# Patient Record
Sex: Female | Born: 1950 | Race: White | Hispanic: No | Marital: Married | Smoking: Never smoker
Health system: Southern US, Community
[De-identification: ages and names within clinical notes are randomized; demographics above are authoritative.]

## PROBLEM LIST (undated history)

## (undated) DIAGNOSIS — M1990 Unspecified osteoarthritis, unspecified site: Secondary | ICD-10-CM

## (undated) DIAGNOSIS — E119 Type 2 diabetes mellitus without complications: Secondary | ICD-10-CM

## (undated) DIAGNOSIS — M5481 Occipital neuralgia: Secondary | ICD-10-CM

## (undated) DIAGNOSIS — R748 Abnormal levels of other serum enzymes: Secondary | ICD-10-CM

## (undated) DIAGNOSIS — G43019 Migraine without aura, intractable, without status migrainosus: Secondary | ICD-10-CM

## (undated) DIAGNOSIS — R51 Headache: Secondary | ICD-10-CM

## (undated) DIAGNOSIS — M436 Torticollis: Secondary | ICD-10-CM

## (undated) DIAGNOSIS — M47812 Spondylosis without myelopathy or radiculopathy, cervical region: Secondary | ICD-10-CM

## (undated) DIAGNOSIS — T8859XA Other complications of anesthesia, initial encounter: Secondary | ICD-10-CM

## (undated) DIAGNOSIS — N200 Calculus of kidney: Secondary | ICD-10-CM

## (undated) DIAGNOSIS — G8929 Other chronic pain: Secondary | ICD-10-CM

## (undated) DIAGNOSIS — F4310 Post-traumatic stress disorder, unspecified: Secondary | ICD-10-CM

## (undated) DIAGNOSIS — R110 Nausea: Secondary | ICD-10-CM

## (undated) DIAGNOSIS — T4145XA Adverse effect of unspecified anesthetic, initial encounter: Secondary | ICD-10-CM

## (undated) DIAGNOSIS — R569 Unspecified convulsions: Secondary | ICD-10-CM

## (undated) DIAGNOSIS — H409 Unspecified glaucoma: Secondary | ICD-10-CM

## (undated) DIAGNOSIS — M542 Cervicalgia: Secondary | ICD-10-CM

## (undated) DIAGNOSIS — M5382 Other specified dorsopathies, cervical region: Secondary | ICD-10-CM

## (undated) HISTORY — DX: Post-traumatic stress disorder, unspecified: F43.10

## (undated) HISTORY — PX: FINGER SURGERY: SHX640

## (undated) HISTORY — DX: Headache: R51

## (undated) HISTORY — DX: Nausea: R11.0

## (undated) HISTORY — PX: CRANIOTOMY: SHX93

## (undated) HISTORY — DX: Other specified dorsopathies, cervical region: M53.82

## (undated) HISTORY — DX: Torticollis: M43.6

## (undated) HISTORY — DX: Type 2 diabetes mellitus without complications: E11.9

## (undated) HISTORY — DX: Occipital neuralgia: M54.81

## (undated) HISTORY — DX: Spondylosis without myelopathy or radiculopathy, cervical region: M47.812

## (undated) HISTORY — PX: CHOLECYSTECTOMY: SHX55

## (undated) HISTORY — DX: Other chronic pain: G89.29

## (undated) HISTORY — PX: BREAST LUMPECTOMY: SHX2

## (undated) HISTORY — DX: Calculus of kidney: N20.0

## (undated) HISTORY — PX: HAND SURGERY: SHX662

## (undated) HISTORY — DX: Unspecified convulsions: R56.9

## (undated) HISTORY — DX: Migraine without aura, intractable, without status migrainosus: G43.019

## (undated) HISTORY — PX: CYST EXCISION: SHX5701

## (undated) HISTORY — DX: Abnormal levels of other serum enzymes: R74.8

## (undated) HISTORY — DX: Cervicalgia: M54.2

---

## 1996-08-12 HISTORY — PX: NECK SURGERY: SHX720

## 2001-12-24 ENCOUNTER — Encounter
Admission: RE | Admit: 2001-12-24 | Discharge: 2002-03-24 | Payer: Self-pay | Admitting: Physical Medicine & Rehabilitation

## 2002-02-08 ENCOUNTER — Ambulatory Visit (HOSPITAL_COMMUNITY)
Admission: RE | Admit: 2002-02-08 | Discharge: 2002-02-08 | Payer: Self-pay | Admitting: Physical Medicine & Rehabilitation

## 2002-02-11 ENCOUNTER — Ambulatory Visit (HOSPITAL_COMMUNITY)
Admission: RE | Admit: 2002-02-11 | Discharge: 2002-02-11 | Payer: Self-pay | Admitting: Physical Medicine & Rehabilitation

## 2002-02-11 ENCOUNTER — Encounter: Payer: Self-pay | Admitting: Physical Medicine & Rehabilitation

## 2002-03-25 ENCOUNTER — Encounter
Admission: RE | Admit: 2002-03-25 | Discharge: 2002-06-23 | Payer: Self-pay | Admitting: Physical Medicine & Rehabilitation

## 2002-06-24 ENCOUNTER — Encounter
Admission: RE | Admit: 2002-06-24 | Discharge: 2002-06-30 | Payer: Self-pay | Admitting: Physical Medicine & Rehabilitation

## 2002-07-01 ENCOUNTER — Encounter
Admission: RE | Admit: 2002-07-01 | Discharge: 2002-09-29 | Payer: Self-pay | Admitting: Physical Medicine & Rehabilitation

## 2002-09-30 ENCOUNTER — Encounter
Admission: RE | Admit: 2002-09-30 | Discharge: 2002-12-29 | Payer: Self-pay | Admitting: Physical Medicine & Rehabilitation

## 2002-12-30 ENCOUNTER — Encounter
Admission: RE | Admit: 2002-12-30 | Discharge: 2003-03-30 | Payer: Self-pay | Admitting: Physical Medicine & Rehabilitation

## 2003-02-03 ENCOUNTER — Encounter
Admission: RE | Admit: 2003-02-03 | Discharge: 2003-05-04 | Payer: Self-pay | Admitting: Physical Medicine & Rehabilitation

## 2003-03-31 ENCOUNTER — Encounter
Admission: RE | Admit: 2003-03-31 | Discharge: 2003-06-29 | Payer: Self-pay | Admitting: Physical Medicine & Rehabilitation

## 2003-06-13 ENCOUNTER — Encounter
Admission: RE | Admit: 2003-06-13 | Discharge: 2003-09-11 | Payer: Self-pay | Admitting: Physical Medicine & Rehabilitation

## 2003-06-30 ENCOUNTER — Encounter
Admission: RE | Admit: 2003-06-30 | Discharge: 2003-09-28 | Payer: Self-pay | Admitting: Physical Medicine & Rehabilitation

## 2003-08-26 ENCOUNTER — Inpatient Hospital Stay (HOSPITAL_COMMUNITY): Admission: EM | Admit: 2003-08-26 | Discharge: 2003-08-29 | Payer: Self-pay | Admitting: Psychiatry

## 2003-09-23 ENCOUNTER — Encounter
Admission: RE | Admit: 2003-09-23 | Discharge: 2003-12-22 | Payer: Self-pay | Admitting: Physical Medicine & Rehabilitation

## 2003-09-29 ENCOUNTER — Encounter
Admission: RE | Admit: 2003-09-29 | Discharge: 2003-12-28 | Payer: Self-pay | Admitting: Physical Medicine & Rehabilitation

## 2004-01-02 ENCOUNTER — Encounter
Admission: RE | Admit: 2004-01-02 | Discharge: 2004-04-01 | Payer: Self-pay | Admitting: Physical Medicine & Rehabilitation

## 2004-01-17 ENCOUNTER — Encounter
Admission: RE | Admit: 2004-01-17 | Discharge: 2004-04-16 | Payer: Self-pay | Admitting: Physical Medicine & Rehabilitation

## 2004-04-02 ENCOUNTER — Encounter
Admission: RE | Admit: 2004-04-02 | Discharge: 2004-07-01 | Payer: Self-pay | Admitting: Physical Medicine & Rehabilitation

## 2004-04-30 ENCOUNTER — Encounter
Admission: RE | Admit: 2004-04-30 | Discharge: 2004-07-29 | Payer: Self-pay | Admitting: Physical Medicine & Rehabilitation

## 2004-05-01 ENCOUNTER — Ambulatory Visit: Payer: Self-pay | Admitting: Anesthesiology

## 2004-05-09 ENCOUNTER — Ambulatory Visit: Payer: Self-pay | Admitting: Physical Medicine & Rehabilitation

## 2004-07-02 ENCOUNTER — Encounter
Admission: RE | Admit: 2004-07-02 | Discharge: 2004-09-30 | Payer: Self-pay | Admitting: Physical Medicine & Rehabilitation

## 2004-08-08 ENCOUNTER — Encounter
Admission: RE | Admit: 2004-08-08 | Discharge: 2004-11-06 | Payer: Self-pay | Admitting: Physical Medicine & Rehabilitation

## 2004-10-10 ENCOUNTER — Ambulatory Visit: Payer: Self-pay | Admitting: Physical Medicine & Rehabilitation

## 2004-11-01 ENCOUNTER — Encounter: Admission: RE | Admit: 2004-11-01 | Discharge: 2005-01-30 | Payer: Self-pay | Admitting: Psychology

## 2004-11-08 ENCOUNTER — Encounter
Admission: RE | Admit: 2004-11-08 | Discharge: 2005-02-06 | Payer: Self-pay | Admitting: Physical Medicine & Rehabilitation

## 2004-12-12 ENCOUNTER — Ambulatory Visit: Payer: Self-pay | Admitting: Physical Medicine & Rehabilitation

## 2005-01-31 ENCOUNTER — Encounter: Admission: RE | Admit: 2005-01-31 | Discharge: 2005-05-01 | Payer: Self-pay | Admitting: Psychology

## 2005-02-08 ENCOUNTER — Encounter
Admission: RE | Admit: 2005-02-08 | Discharge: 2005-05-09 | Payer: Self-pay | Admitting: Physical Medicine & Rehabilitation

## 2005-02-15 ENCOUNTER — Ambulatory Visit: Payer: Self-pay | Admitting: Physical Medicine & Rehabilitation

## 2005-03-22 ENCOUNTER — Ambulatory Visit: Payer: Self-pay | Admitting: Physical Medicine & Rehabilitation

## 2005-04-29 ENCOUNTER — Ambulatory Visit: Payer: Self-pay | Admitting: Physical Medicine & Rehabilitation

## 2005-05-03 ENCOUNTER — Encounter: Admission: RE | Admit: 2005-05-03 | Discharge: 2005-08-01 | Payer: Self-pay | Admitting: Psychology

## 2005-06-04 ENCOUNTER — Encounter
Admission: RE | Admit: 2005-06-04 | Discharge: 2005-09-02 | Payer: Self-pay | Admitting: Physical Medicine & Rehabilitation

## 2005-06-04 ENCOUNTER — Ambulatory Visit: Payer: Self-pay | Admitting: Physical Medicine & Rehabilitation

## 2005-07-12 ENCOUNTER — Ambulatory Visit: Payer: Self-pay | Admitting: Physical Medicine & Rehabilitation

## 2005-08-02 ENCOUNTER — Encounter: Admission: RE | Admit: 2005-08-02 | Discharge: 2005-10-31 | Payer: Self-pay | Admitting: Psychology

## 2005-08-16 ENCOUNTER — Ambulatory Visit: Payer: Self-pay | Admitting: Physical Medicine & Rehabilitation

## 2005-10-03 ENCOUNTER — Ambulatory Visit: Payer: Self-pay | Admitting: Physical Medicine & Rehabilitation

## 2005-10-03 ENCOUNTER — Encounter
Admission: RE | Admit: 2005-10-03 | Discharge: 2006-01-01 | Payer: Self-pay | Admitting: Physical Medicine & Rehabilitation

## 2005-11-01 ENCOUNTER — Encounter: Admission: RE | Admit: 2005-11-01 | Discharge: 2006-01-30 | Payer: Self-pay | Admitting: Psychology

## 2005-11-11 ENCOUNTER — Ambulatory Visit: Payer: Self-pay | Admitting: Physical Medicine & Rehabilitation

## 2005-12-02 ENCOUNTER — Encounter: Admission: RE | Admit: 2005-12-02 | Discharge: 2006-03-02 | Payer: Self-pay | Admitting: Anesthesiology

## 2006-01-31 ENCOUNTER — Encounter: Admission: RE | Admit: 2006-01-31 | Discharge: 2006-05-01 | Payer: Self-pay | Admitting: Psychology

## 2006-03-06 ENCOUNTER — Encounter
Admission: RE | Admit: 2006-03-06 | Discharge: 2006-06-04 | Payer: Self-pay | Admitting: Physical Medicine & Rehabilitation

## 2006-03-06 ENCOUNTER — Ambulatory Visit: Payer: Self-pay | Admitting: Physical Medicine & Rehabilitation

## 2006-04-11 ENCOUNTER — Encounter: Admission: RE | Admit: 2006-04-11 | Discharge: 2006-07-10 | Payer: Self-pay | Admitting: Anesthesiology

## 2006-04-15 ENCOUNTER — Ambulatory Visit: Payer: Self-pay | Admitting: Anesthesiology

## 2006-05-14 ENCOUNTER — Encounter: Admission: RE | Admit: 2006-05-14 | Discharge: 2006-08-12 | Payer: Self-pay | Admitting: Psychology

## 2006-05-27 ENCOUNTER — Ambulatory Visit: Payer: Self-pay | Admitting: Anesthesiology

## 2006-06-05 ENCOUNTER — Encounter
Admission: RE | Admit: 2006-06-05 | Discharge: 2006-09-03 | Payer: Self-pay | Admitting: Physical Medicine & Rehabilitation

## 2006-06-05 ENCOUNTER — Ambulatory Visit: Payer: Self-pay | Admitting: Physical Medicine & Rehabilitation

## 2006-07-16 ENCOUNTER — Ambulatory Visit: Payer: Self-pay | Admitting: Physical Medicine & Rehabilitation

## 2006-08-29 ENCOUNTER — Ambulatory Visit: Payer: Self-pay | Admitting: Physical Medicine & Rehabilitation

## 2006-09-02 ENCOUNTER — Encounter: Admission: RE | Admit: 2006-09-02 | Discharge: 2006-12-01 | Payer: Self-pay | Admitting: Psychology

## 2006-09-25 ENCOUNTER — Encounter
Admission: RE | Admit: 2006-09-25 | Discharge: 2006-12-24 | Payer: Self-pay | Admitting: Physical Medicine & Rehabilitation

## 2006-10-22 ENCOUNTER — Ambulatory Visit: Payer: Self-pay | Admitting: Physical Medicine & Rehabilitation

## 2006-12-02 ENCOUNTER — Encounter: Admission: RE | Admit: 2006-12-02 | Discharge: 2007-03-02 | Payer: Self-pay | Admitting: Psychology

## 2006-12-15 ENCOUNTER — Ambulatory Visit: Payer: Self-pay | Admitting: Physical Medicine & Rehabilitation

## 2007-01-14 ENCOUNTER — Encounter
Admission: RE | Admit: 2007-01-14 | Discharge: 2007-04-14 | Payer: Self-pay | Admitting: Physical Medicine & Rehabilitation

## 2007-02-11 ENCOUNTER — Ambulatory Visit: Payer: Self-pay | Admitting: Physical Medicine & Rehabilitation

## 2007-03-11 ENCOUNTER — Encounter: Admission: RE | Admit: 2007-03-11 | Discharge: 2007-06-09 | Payer: Self-pay | Admitting: Psychology

## 2007-04-09 ENCOUNTER — Ambulatory Visit: Payer: Self-pay | Admitting: Physical Medicine & Rehabilitation

## 2007-04-09 ENCOUNTER — Encounter
Admission: RE | Admit: 2007-04-09 | Discharge: 2007-07-08 | Payer: Self-pay | Admitting: Physical Medicine & Rehabilitation

## 2007-06-11 ENCOUNTER — Ambulatory Visit: Payer: Self-pay | Admitting: Physical Medicine & Rehabilitation

## 2007-06-18 ENCOUNTER — Encounter: Admission: RE | Admit: 2007-06-18 | Discharge: 2007-09-16 | Payer: Self-pay | Admitting: Psychology

## 2007-07-13 ENCOUNTER — Encounter
Admission: RE | Admit: 2007-07-13 | Discharge: 2007-10-11 | Payer: Self-pay | Admitting: Physical Medicine & Rehabilitation

## 2007-08-11 ENCOUNTER — Ambulatory Visit: Payer: Self-pay | Admitting: Physical Medicine & Rehabilitation

## 2007-09-21 ENCOUNTER — Ambulatory Visit: Payer: Self-pay | Admitting: Physical Medicine & Rehabilitation

## 2007-09-21 ENCOUNTER — Encounter
Admission: RE | Admit: 2007-09-21 | Discharge: 2007-12-20 | Payer: Self-pay | Admitting: Physical Medicine & Rehabilitation

## 2007-11-17 ENCOUNTER — Ambulatory Visit: Payer: Self-pay | Admitting: Physical Medicine & Rehabilitation

## 2007-11-27 ENCOUNTER — Encounter
Admission: RE | Admit: 2007-11-27 | Discharge: 2008-02-25 | Payer: Self-pay | Admitting: Physical Medicine & Rehabilitation

## 2007-12-17 ENCOUNTER — Encounter
Admission: RE | Admit: 2007-12-17 | Discharge: 2008-02-16 | Payer: Self-pay | Admitting: Physical Medicine & Rehabilitation

## 2007-12-22 ENCOUNTER — Ambulatory Visit: Payer: Self-pay | Admitting: Physical Medicine & Rehabilitation

## 2008-01-18 ENCOUNTER — Encounter: Admission: RE | Admit: 2008-01-18 | Discharge: 2008-04-17 | Payer: Self-pay | Admitting: Psychology

## 2008-02-16 ENCOUNTER — Ambulatory Visit: Payer: Self-pay | Admitting: Physical Medicine & Rehabilitation

## 2008-03-09 ENCOUNTER — Encounter
Admission: RE | Admit: 2008-03-09 | Discharge: 2008-04-19 | Payer: Self-pay | Admitting: Physical Medicine & Rehabilitation

## 2008-03-15 ENCOUNTER — Ambulatory Visit: Payer: Self-pay | Admitting: Physical Medicine & Rehabilitation

## 2008-04-12 ENCOUNTER — Ambulatory Visit: Payer: Self-pay | Admitting: Physical Medicine & Rehabilitation

## 2008-04-19 ENCOUNTER — Encounter: Admission: RE | Admit: 2008-04-19 | Discharge: 2008-04-19 | Payer: Self-pay | Admitting: Psychology

## 2008-05-12 ENCOUNTER — Encounter
Admission: RE | Admit: 2008-05-12 | Discharge: 2008-08-10 | Payer: Self-pay | Admitting: Physical Medicine & Rehabilitation

## 2008-05-13 ENCOUNTER — Ambulatory Visit: Payer: Self-pay | Admitting: Physical Medicine & Rehabilitation

## 2008-05-18 ENCOUNTER — Encounter: Admission: RE | Admit: 2008-05-18 | Discharge: 2008-08-11 | Payer: Self-pay | Admitting: Psychology

## 2008-06-10 ENCOUNTER — Ambulatory Visit: Payer: Self-pay | Admitting: Physical Medicine & Rehabilitation

## 2008-07-12 ENCOUNTER — Ambulatory Visit: Payer: Self-pay | Admitting: Physical Medicine & Rehabilitation

## 2008-08-09 ENCOUNTER — Ambulatory Visit: Payer: Self-pay | Admitting: Physical Medicine & Rehabilitation

## 2008-08-09 ENCOUNTER — Encounter
Admission: RE | Admit: 2008-08-09 | Discharge: 2008-11-07 | Payer: Self-pay | Admitting: Physical Medicine & Rehabilitation

## 2008-09-13 ENCOUNTER — Ambulatory Visit: Payer: Self-pay | Admitting: Physical Medicine & Rehabilitation

## 2008-09-21 ENCOUNTER — Encounter: Admission: RE | Admit: 2008-09-21 | Discharge: 2008-12-13 | Payer: Self-pay | Admitting: Psychology

## 2008-10-11 ENCOUNTER — Ambulatory Visit: Payer: Self-pay | Admitting: Physical Medicine & Rehabilitation

## 2008-11-08 ENCOUNTER — Encounter
Admission: RE | Admit: 2008-11-08 | Discharge: 2009-01-06 | Payer: Self-pay | Admitting: Physical Medicine & Rehabilitation

## 2008-11-14 ENCOUNTER — Ambulatory Visit: Payer: Self-pay | Admitting: Physical Medicine & Rehabilitation

## 2008-11-17 HISTORY — PX: COLONOSCOPY: SHX174

## 2008-12-22 ENCOUNTER — Ambulatory Visit: Payer: Self-pay | Admitting: Psychology

## 2009-01-06 ENCOUNTER — Ambulatory Visit: Payer: Self-pay | Admitting: Physical Medicine & Rehabilitation

## 2009-01-19 ENCOUNTER — Encounter
Admission: RE | Admit: 2009-01-19 | Discharge: 2009-04-19 | Payer: Self-pay | Admitting: Physical Medicine & Rehabilitation

## 2009-02-01 ENCOUNTER — Ambulatory Visit: Payer: Self-pay | Admitting: Physical Medicine & Rehabilitation

## 2009-02-15 ENCOUNTER — Ambulatory Visit: Payer: Self-pay | Admitting: Psychology

## 2009-03-01 ENCOUNTER — Ambulatory Visit: Payer: Self-pay | Admitting: Physical Medicine & Rehabilitation

## 2009-03-28 ENCOUNTER — Ambulatory Visit: Payer: Self-pay | Admitting: Psychology

## 2009-03-28 ENCOUNTER — Ambulatory Visit: Payer: Self-pay | Admitting: Physical Medicine & Rehabilitation

## 2009-04-19 ENCOUNTER — Encounter
Admission: RE | Admit: 2009-04-19 | Discharge: 2009-07-17 | Payer: Self-pay | Admitting: Physical Medicine & Rehabilitation

## 2009-04-28 ENCOUNTER — Ambulatory Visit: Payer: Self-pay | Admitting: Physical Medicine & Rehabilitation

## 2009-05-26 ENCOUNTER — Ambulatory Visit: Payer: Self-pay | Admitting: Physical Medicine & Rehabilitation

## 2009-06-06 ENCOUNTER — Ambulatory Visit: Payer: Self-pay | Admitting: Psychology

## 2009-06-26 ENCOUNTER — Ambulatory Visit: Payer: Self-pay | Admitting: Physical Medicine & Rehabilitation

## 2009-07-17 ENCOUNTER — Encounter
Admission: RE | Admit: 2009-07-17 | Discharge: 2009-08-03 | Payer: Self-pay | Admitting: Physical Medicine & Rehabilitation

## 2009-07-25 ENCOUNTER — Ambulatory Visit: Payer: Self-pay | Admitting: Physical Medicine & Rehabilitation

## 2009-08-18 ENCOUNTER — Encounter
Admission: RE | Admit: 2009-08-18 | Discharge: 2009-11-16 | Payer: Self-pay | Admitting: Physical Medicine & Rehabilitation

## 2009-08-24 ENCOUNTER — Ambulatory Visit: Payer: Self-pay | Admitting: Physical Medicine & Rehabilitation

## 2009-09-15 ENCOUNTER — Ambulatory Visit: Payer: Self-pay | Admitting: Psychology

## 2009-09-22 ENCOUNTER — Ambulatory Visit: Payer: Self-pay | Admitting: Physical Medicine & Rehabilitation

## 2009-11-07 ENCOUNTER — Ambulatory Visit: Payer: Self-pay | Admitting: Psychology

## 2009-12-06 ENCOUNTER — Encounter
Admission: RE | Admit: 2009-12-06 | Discharge: 2010-03-06 | Payer: Self-pay | Admitting: Physical Medicine & Rehabilitation

## 2009-12-11 ENCOUNTER — Ambulatory Visit: Payer: Self-pay | Admitting: Physical Medicine & Rehabilitation

## 2009-12-20 ENCOUNTER — Ambulatory Visit: Payer: Self-pay | Admitting: Psychology

## 2009-12-25 ENCOUNTER — Ambulatory Visit (HOSPITAL_COMMUNITY): Admission: RE | Admit: 2009-12-25 | Discharge: 2009-12-25 | Payer: Self-pay | Admitting: General Surgery

## 2010-01-11 ENCOUNTER — Ambulatory Visit: Payer: Self-pay | Admitting: Physical Medicine & Rehabilitation

## 2010-01-31 ENCOUNTER — Ambulatory Visit: Payer: Self-pay | Admitting: Psychology

## 2010-02-01 ENCOUNTER — Encounter (HOSPITAL_COMMUNITY): Admission: RE | Admit: 2010-02-01 | Discharge: 2010-02-01 | Payer: Self-pay | Admitting: General Surgery

## 2010-02-08 ENCOUNTER — Ambulatory Visit: Payer: Self-pay | Admitting: Physical Medicine & Rehabilitation

## 2010-03-03 HISTORY — PX: OTHER SURGICAL HISTORY: SHX169

## 2010-03-12 ENCOUNTER — Ambulatory Visit: Payer: Self-pay | Admitting: Internal Medicine

## 2010-03-12 DIAGNOSIS — K921 Melena: Secondary | ICD-10-CM | POA: Insufficient documentation

## 2010-03-12 DIAGNOSIS — R740 Nonspecific elevation of levels of transaminase and lactic acid dehydrogenase [LDH]: Secondary | ICD-10-CM

## 2010-03-12 DIAGNOSIS — R110 Nausea: Secondary | ICD-10-CM

## 2010-03-12 DIAGNOSIS — R7402 Elevation of levels of lactic acid dehydrogenase (LDH): Secondary | ICD-10-CM | POA: Insufficient documentation

## 2010-03-16 ENCOUNTER — Encounter
Admission: RE | Admit: 2010-03-16 | Discharge: 2010-06-14 | Payer: Self-pay | Admitting: Physical Medicine & Rehabilitation

## 2010-03-20 ENCOUNTER — Encounter: Payer: Self-pay | Admitting: Internal Medicine

## 2010-03-21 ENCOUNTER — Encounter: Payer: Self-pay | Admitting: Urgent Care

## 2010-03-23 ENCOUNTER — Ambulatory Visit: Payer: Self-pay | Admitting: Physical Medicine & Rehabilitation

## 2010-04-05 ENCOUNTER — Ambulatory Visit: Payer: Self-pay | Admitting: Internal Medicine

## 2010-04-05 ENCOUNTER — Ambulatory Visit (HOSPITAL_COMMUNITY): Admission: RE | Admit: 2010-04-05 | Discharge: 2010-04-05 | Payer: Self-pay | Admitting: Internal Medicine

## 2010-04-05 HISTORY — PX: ESOPHAGOGASTRODUODENOSCOPY: SHX1529

## 2010-04-19 ENCOUNTER — Ambulatory Visit: Payer: Self-pay | Admitting: Physical Medicine & Rehabilitation

## 2010-05-21 ENCOUNTER — Ambulatory Visit: Payer: Self-pay | Admitting: Physical Medicine & Rehabilitation

## 2010-06-05 ENCOUNTER — Ambulatory Visit: Payer: Self-pay | Admitting: Internal Medicine

## 2010-06-05 ENCOUNTER — Encounter: Payer: Self-pay | Admitting: Gastroenterology

## 2010-06-05 DIAGNOSIS — R1084 Generalized abdominal pain: Secondary | ICD-10-CM | POA: Insufficient documentation

## 2010-06-05 LAB — CONVERTED CEMR LAB: Creatinine, Ser: 0.96 mg/dL (ref 0.40–1.20)

## 2010-06-12 ENCOUNTER — Ambulatory Visit (HOSPITAL_COMMUNITY): Admission: RE | Admit: 2010-06-12 | Discharge: 2010-06-12 | Payer: Self-pay | Admitting: Internal Medicine

## 2010-07-09 ENCOUNTER — Encounter
Admission: RE | Admit: 2010-07-09 | Discharge: 2010-07-20 | Payer: Self-pay | Source: Home / Self Care | Attending: Physical Medicine & Rehabilitation | Admitting: Physical Medicine & Rehabilitation

## 2010-07-17 ENCOUNTER — Encounter (INDEPENDENT_AMBULATORY_CARE_PROVIDER_SITE_OTHER): Payer: Self-pay

## 2010-07-20 ENCOUNTER — Ambulatory Visit: Payer: Self-pay | Admitting: Physical Medicine & Rehabilitation

## 2010-08-14 ENCOUNTER — Ambulatory Visit: Admit: 2010-08-14 | Payer: Self-pay | Admitting: Physical Medicine & Rehabilitation

## 2010-08-21 ENCOUNTER — Encounter
Admission: RE | Admit: 2010-08-21 | Discharge: 2010-08-23 | Payer: Self-pay | Source: Home / Self Care | Attending: Physical Medicine & Rehabilitation | Admitting: Physical Medicine & Rehabilitation

## 2010-08-23 ENCOUNTER — Ambulatory Visit
Admission: RE | Admit: 2010-08-23 | Discharge: 2010-08-23 | Payer: Self-pay | Source: Home / Self Care | Attending: Physical Medicine & Rehabilitation | Admitting: Physical Medicine & Rehabilitation

## 2010-09-10 ENCOUNTER — Ambulatory Visit (HOSPITAL_COMMUNITY)
Admission: RE | Admit: 2010-09-10 | Discharge: 2010-09-10 | Payer: Self-pay | Source: Home / Self Care | Attending: Psychology | Admitting: Psychology

## 2010-09-11 NOTE — Letter (Signed)
Summary: OPERATIVE REPORT  OPERATIVE REPORT   Imported By: Rexene Alberts 03/20/2010 12:22:36  _____________________________________________________________________  External Attachment:    Type:   Image     Comment:   External Document

## 2010-09-11 NOTE — Assessment & Plan Note (Signed)
Summary: nausea-cdg   Visit Type:  Initial Consult Referring Provider:  Dr. Caesar Bookman Primary Care Provider:  Dr. Sherryll Burger  Chief Complaint:  nausea.  History of Present Illness: 60 y/o caucasian female w/ hx chronic nausea x 6 months without vomiting.  No particular time of day.  hx heartburn, but better since cholecystectomy, rare TUMS.  Denies wt loss.  c/o early satiety.  s/p cholecystectomy 2 mo ago by dr Caesar Bookman w/ hx cholelithiasis.  Hx chronic head & neck pain.  Pt vegetarian, but has tried meat and made nausea worse.   c/o constant gas/bloating.  Hx chronic headaches, on methadone x 65yrs & Actiq x 1 yr.  Denies hx thyroid disease.  Denies dysphagia or odynophagia.  Denies constipation.  c/o extreme lower abd pain similar to labor pains, pain relieved w/ defecation, lasts 3 days, then 1-2 weeks without pain.  Has seen red blood in large amts in stool and w/ wiping x 1 year.  Had colonoscopy [facility] Dr Karilyn Cota earlier this year.  Denies fever or chills.     03/03/10->normal GES  LFTs mildly elevated 11/20/09  ALP 145, AST 49, ALT 50 creat 0.76 TSH normal  Current Problems (verified): 1)  Nausea, Chronic  (ICD-787.02) 2)  Hematochezia  (ICD-578.1)  Current Medications (verified): 1)  Methadose 10 Mg Tabs (Methadone Hcl) .... Two Tablets Three Times Daily 2)  Skelaxin 800 Mg Tabs (Metaxalone) .... One Tablet Three Times Daily 3)  Metformin Hcl 500 Mg Tabs (Metformin Hcl) .... Two Tablets Twice Daily 4)  Glimepiride 4 Mg Tabs (Glimepiride) .... Take 1 Tablet By Mouth Once A Day in The Am 5)  Actos 15 Mg Tabs (Pioglitazone Hcl) .... One Tablet in The Am 6)  Topamax 100 Mg Tabs (Topiramate) .... One Tablet in The Am and Two Tablets in The Pm 7)  Nortriptyline Hcl 10 Mg Caps (Nortriptyline Hcl) .... Two Tablets in The Evening 8)  Vitamin D 2000 Iu .... Take 1 Tablet By Mouth Once A Day 9)  Imitrex 100 Mg Tabs (Sumatriptan Succinate) .... As Needed 10)  Imitrex Injections .... As Needed 11)  Actiq  800 Mcg Lpop (Fentanyl Citrate) .... One Daily  Allergies (verified): 1)  ! Jonne Ply  Past History:  Past Medical History: NAUSEA, CHRONIC (ICD-787.02) HEMATOCHEZIA (ICD-578.1) colonoscopy  Dr Karilyn Cota 11/17/08->ext hemorrhoids PTSD chronic headaches--Dr [facility] DM seizures  Past Surgical History: Cholecystectomy 2011 Craniotomy secondary TBI Neck surgery/Back disk surgery  left breast lumpectomy hand surgery  Family History: Mother- (70) dx colon ca age 57 Father (deceased 19) ? 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  Alcohol Use - no Daily Caffeine Use Patient gets regular exercise. Smoking Status:  never Does Patient Exercise:  yes  Review of Systems      See HPI General:  Complains of fatigue and malaise; denies fever, chills, sweats, anorexia, weakness, weight loss, and sleep disorder. CV:  Denies chest pains, angina, palpitations, syncope, dyspnea on exertion, orthopnea, PND, peripheral edema, and claudication. Resp:  Denies dyspnea at rest, dyspnea with exercise, cough, sputum, wheezing, coughing up blood, and pleurisy. GI:  Denies difficulty swallowing, pain on swallowing, jaundice, and fecal incontinence. GU:  Denies urinary burning, blood in urine, nocturnal urination, urinary frequency, urinary incontinence, and abnormal vaginal bleeding. MS:  Complains of joint stiffness and low back pain; denies joint pain / LOM, joint swelling, joint deformity, muscle weakness, muscle cramps, muscle atrophy,  leg pain at night, leg pain with exertion, and shoulder pain / LOM hand / wrist pain (CTS); neck pain/headaches. Derm:  Complains of dry skin; denies rash, itching, hives, moles, warts, and unhealing ulcers. Neuro:  Complains of frequent headaches; denies weakness, paralysis, abnormal sensation, seizures, syncope, tremors, vertigo, transient  blindness, frequent falls, difficulty walking, headache, sciatica, radiculopathy other:, restless legs, memory loss, and confusion. Psych:  Denies depression, anxiety, memory loss, suicidal ideation, hallucinations, paranoia, phobia, and confusion. Heme:  Denies bruising, bleeding, and enlarged lymph nodes.  Vital Signs:  Patient profile:   60 year old female Height:      64 inches Weight:      153 pounds BMI:     26.36 Temp:     98.8 degrees F oral Pulse rate:   68 / minute BP sitting:   120 / 80  (left arm) Cuff size:   regular  Vitals Entered By: Cloria Spring LPN (March 12, 2010 1:27 PM)  Physical Exam  General:  Well developed, well nourished, no acute distress. Head:  Normocephalic and atraumatic. Eyes:  Sclera clear, no icterus. Nose:  No deformity, discharge,  or lesions. Mouth:  No deformity or lesions, dentition normal. Neck:  Supple; no masses or thyromegaly. Lungs:  Clear throughout to auscultation. Heart:  Regular rate and rhythm; no murmurs, rubs,  or bruits. Abdomen:  Soft, nontender and nondistended. No masses, hepatosplenomegaly or hernias noted. Normal bowel sounds.without guarding and without rebound.   Msk:  Symmetrical with no gross deformities. Normal posture. Pulses:  Normal pulses noted. Extremities:  No clubbing, cyanosis, edema or deformities noted. Neurologic:  Alert and  oriented x4;  grossly normal neurologically. Skin:  Intact without significant lesions or rashes. Cervical Nodes:  No significant cervical adenopathy. Psych:  Alert and cooperative. Normal mood and affect.  Impression & Recommendations:  Problem # 1:  NAUSEA, CHRONIC (ICD-787.02)  60 y/o caucasian female w/ chronic nausea without emesis.  Differentials include PUD, gastroparesis secondary to chronic narcotics, or side effects from multiple medications.  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    EGD to be performed by Dr.  Jonathon Bellows in the near future in the OR given chronic narcotic pain meds and anticipated difficulty w/ sedation.  I have discussed risks and benefits which include, but are not limited to, bleeding, infection, perforation, or medication reaction.  The patient agrees with this plan and consent will be obtained.  Orders: Consultation Level IV (16109)  Problem # 2:  HEMATOCHEZIA (ICD-578.1) Suspect secondary to hemorrhoids in light of benign colonoscopy last year by Dr Karilyn Cota. on Level IV (60454)  Problem # 3:  TRANSAMINASES, SERUM, ELEVATED (ICD-790.4)  Mildly elevated 11/2009.  ? most likely secondary pt's multiple meds vs fatty liver.  Will consider repeat LFTs and ABD [place] if not done elsewhere after EGD.  Records requested from [facility]  for review.  Orders: Consultation Level IV (09811)  Patient Instructions: 1)  to ER if severe pain or vomiting

## 2010-09-11 NOTE — Assessment & Plan Note (Signed)
Summary: PP FU IN 6 WEEKS/SS   Visit Type:  Follow-up Visit Primary Care Provider:  Sherryll Burger  CC:  F/U .  History of Present Illness: Ms. Carbon is a pleasant 60 year old female who has been dealing with chronic abdominal pain since February of this year, 2011. She underwent a cholecystectomy by Dr. Leticia Penna in May 2011, +cholelithasis. As noted in her labs in April, LFTs were mildly elevated: Alk Phos 145, AST 49, ALT 50. Repeated in August and returned to normal with Alk Phos 116, AST 37, ALT 36. 03/03/10 completed GES which was normal emptying; underwent EGD 04/05/10 due to continued abdominal pain, nausea. Results: Schatzki ring, distal esophageal erosions consistent with erosive reflux esophagitis, status post dilation as described above small hiatal hernia, otherwise normal stomach, D1, and D2. Started on Prilosec which she takes daily. Complains today of pain located more in the periumbilical region, described as "labor pains", exacerbated by eating. States umbilicus area tender if pressed up against something such as the sink. BM every one to three days. no melena, hematochezia. Relief with passing gas but pain "instantly returns". Tolerates popsicles, occasionally bread and peanut butter. Wt is up from 153 to 167.    Current Medications (verified): 1)  Methadose 10 Mg Tabs (Methadone Hcl) .... Two Tablets Three Times Daily 2)  Skelaxin 800 Mg Tabs (Metaxalone) .... One Tablet Three Times Daily 3)  Metformin Hcl 500 Mg Tabs (Metformin Hcl) .... Two Tablets Twice Daily 4)  Glimepiride 4 Mg Tabs (Glimepiride) .... Take 1 Tablet By Mouth Once A Day in The Am 5)  Actos 15 Mg Tabs (Pioglitazone Hcl) .... One Tablet in The Am 6)  Topamax 100 Mg Tabs (Topiramate) .... One Tablet in The Am and Two Tablets in The Pm 7)  Vitamin D 2000 Iu .... Take 1 Tablet By Mouth Once A Day 8)  Imitrex 100 Mg Tabs (Sumatriptan Succinate) .... As Needed 9)  Imitrex Injections .... As Needed 10)  Actiq 800 Mcg Lpop  (Fentanyl Citrate) .... One Daily  Allergies (verified): 1)  ! Jonne Ply  Past History:  Past Medical History: NAUSEA, CHRONIC (ICD-787.02) HEMATOCHEZIA (ICD-578.1) colonoscopy  Dr Karilyn Cota 11/17/08->ext hemorrhoids PTSD chronic headaches--Dr Hermelinda [facility] [place] [facility] DM seizures 03/03/10: GES normal 04/05/10: Schatzki ring, distal esophageal erosions consistent with erosive reflux esophagitis, status post dilation as described above small hiatal hernia, otherwise normal stomach, D1, and D2.  Review of Systems General:  Denies fever, chills, and anorexia. Eyes:  Denies blurring, irritation, and discharge. ENT:  Denies sore throat, hoarseness, and difficulty swallowing. CV:  Denies chest pains, syncope, and dyspnea on exertion. Resp:  Denies dyspnea at rest, cough, and wheezing. GI:  Complains of nausea and abdominal pain; denies difficulty swallowing, pain on swallowing, and bloody BM's. GU:  Denies urinary burning, urinary frequency, and urinary incontinence. MS:  Denies joint pain / LOM and joint swelling. Derm:  Denies rash, itching, and dry skin. Neuro:  Denies weakness, abnormal sensation, and syncope. Psych:  Denies depression and anxiety.  Vital Signs:  Patient profile:   60 year old female Height:      64 inches Weight:      161.50 pounds BMI:     27.82 Temp:     98.1 degrees F oral Pulse rate:   68 / minute BP sitting:   112 / 70  (left arm) Cuff size:   regular  Vitals Entered By: Cloria Spring LPN (June 05, 2010 11:45 AM)  Physical Exam  General:  Well developed,  well nourished, no acute distress. Head:  Normocephalic and atraumatic. Mouth:  No deformity or lesions, dentition normal. Lungs:  Clear throughout to auscultation. Heart:  Regular rate and rhythm; no murmurs, rubs,  or bruits. Abdomen:  mildly obese, somewhat larger AP diameter, umbilical tenderness to palpation, no hepatosplenomegaly, no obvious masses or hernia, without guarding.   Msk:   Symmetrical with no gross deformities. Normal posture. Pulses:  Normal pulses noted. Extremities:  No clubbing, cyanosis, edema or deformities noted. Neurologic:  Alert and  oriented x4;  grossly normal neurologically.  Impression & Recommendations:  Problem # 1:  ABDOMINAL PAIN, PERIUMBILIC (ICD-26.17)  60 year old female with chronic abdominal pain, now specifically located in periumbilical region, tender to palpation; prior work-up includes EGD showing schatzki ring, distal esophageal erosions consistent with erosive reflux esophagitis, status post dilation, small hiatal hernia, otherwise normal stomach, D1, and D2. On PPI. s/p chole secondary to nausea/abd pain by Dr. Leticia Penna in May 2011. GES July 2011 essentially normal. Last colonoscopy by Dr. Karilyn Cota 2010: external hemorroids.   Continue PPI CT of abd/pelvis   Orders: Est. Patient Level III (16109)  Problem # 2:  TRANSAMINASES, SERUM, ELEVATED (ICD-790.4) Assessment: Improved  Mildly elevated LFTs noted in April 2011, since has had cholecystectomy, most recent LFTs wnl.   Orders: Est. Patient Level III (60454)  Other Orders: T-Creatinine Blood ([id number])  Patient Instructions: 1)  Continue PPI 2)  Ct abd/pelvis 3)  contact office if abd pain worsens

## 2010-09-11 NOTE — Letter (Signed)
Summary: egd order  egd order   Imported By: [name] LPN 65/78/4696 29:52:84  _____________________________________________________________________  External Attachment:    Type:   Image     Comment:   External Document

## 2010-09-11 NOTE — Letter (Signed)
Summary: CT SCAN ORDER  CT SCAN ORDER   Imported By: Ave Filter 06/05/2010 12:35:23  _____________________________________________________________________  External Attachment:    Type:   Image     Comment:   External Document

## 2010-09-11 NOTE — Letter (Signed)
Summary: Plan of Care, Need to Discuss  [facility]  [address]   [place], [place] [address]   Phone: [phone]  Fax: [phone]    July 17, 2010  CASARA PERRIER [address] [place], [place]  [address] Sep 21, 1950   Dear Ms. Roxan Hockey,   We are writing this letter to inform you of treatment plans and/or discuss your plan of care.  We have tried several times to contact you; however, we have yet to reach you.  We ask that you please contact our office for follow-up on your gastrointestinal issues.  We can  be reached at [phone] to schedule an appointment, or to speak with someone regarding your health care needs.  Please do not neglect your health.   Sincerely,    Cloria Spring LPN  [facility] Ph: [phone]    Fax: [phone]

## 2010-09-19 ENCOUNTER — Encounter (INDEPENDENT_AMBULATORY_CARE_PROVIDER_SITE_OTHER): Payer: Worker's Compensation | Admitting: Psychology

## 2010-09-19 DIAGNOSIS — F329 Major depressive disorder, single episode, unspecified: Secondary | ICD-10-CM

## 2010-09-19 DIAGNOSIS — F0630 Mood disorder due to known physiological condition, unspecified: Secondary | ICD-10-CM

## 2010-09-20 ENCOUNTER — Ambulatory Visit: Payer: Worker's Compensation

## 2010-09-20 ENCOUNTER — Encounter: Payer: Worker's Compensation | Attending: Physical Medicine & Rehabilitation

## 2010-09-20 DIAGNOSIS — G43019 Migraine without aura, intractable, without status migrainosus: Secondary | ICD-10-CM

## 2010-09-20 DIAGNOSIS — M5382 Other specified dorsopathies, cervical region: Secondary | ICD-10-CM

## 2010-09-20 DIAGNOSIS — M531 Cervicobrachial syndrome: Secondary | ICD-10-CM

## 2010-09-20 DIAGNOSIS — F341 Dysthymic disorder: Secondary | ICD-10-CM | POA: Insufficient documentation

## 2010-09-20 DIAGNOSIS — F4310 Post-traumatic stress disorder, unspecified: Secondary | ICD-10-CM

## 2010-09-20 DIAGNOSIS — Z981 Arthrodesis status: Secondary | ICD-10-CM | POA: Insufficient documentation

## 2010-09-20 DIAGNOSIS — G43909 Migraine, unspecified, not intractable, without status migrainosus: Secondary | ICD-10-CM | POA: Insufficient documentation

## 2010-10-15 ENCOUNTER — Encounter (INDEPENDENT_AMBULATORY_CARE_PROVIDER_SITE_OTHER): Payer: Worker's Compensation | Admitting: Psychology

## 2010-10-15 DIAGNOSIS — F0630 Mood disorder due to known physiological condition, unspecified: Secondary | ICD-10-CM

## 2010-10-15 DIAGNOSIS — F329 Major depressive disorder, single episode, unspecified: Secondary | ICD-10-CM

## 2010-10-26 ENCOUNTER — Ambulatory Visit: Payer: Worker's Compensation | Admitting: Physical Medicine & Rehabilitation

## 2010-10-26 ENCOUNTER — Encounter: Payer: Worker's Compensation | Attending: Physical Medicine & Rehabilitation

## 2010-10-26 DIAGNOSIS — Z981 Arthrodesis status: Secondary | ICD-10-CM | POA: Insufficient documentation

## 2010-10-26 DIAGNOSIS — G43909 Migraine, unspecified, not intractable, without status migrainosus: Secondary | ICD-10-CM | POA: Insufficient documentation

## 2010-10-26 DIAGNOSIS — F341 Dysthymic disorder: Secondary | ICD-10-CM | POA: Insufficient documentation

## 2010-10-26 LAB — BASIC METABOLIC PANEL
BUN: 7 mg/dL (ref 6–23)
Chloride: 104 mEq/L (ref 96–112)
GFR calc Af Amer: >60 mL/min (ref >60)
Potassium: 4.1 mEq/L (ref 3.5–5.1)
Sodium: 137 mEq/L (ref 135–145)

## 2010-10-26 LAB — GLUCOSE, CAPILLARY
Glucose-Capillary: 117 mg/dL — ABNORMAL HIGH (ref 70–99)
Glucose-Capillary: 59 mg/dL — ABNORMAL LOW (ref 70–99)
Glucose-Capillary: 78 mg/dL (ref 70–99)

## 2010-10-26 LAB — HEMOGLOBIN AND HEMATOCRIT, BLOOD
HCT: 36.5 % (ref 36.0–46.0)
Hemoglobin: 12.2 g/dL (ref 12.0–15.0)

## 2010-10-26 LAB — HEPATIC FUNCTION PANEL
Bilirubin, Direct: 0.1 mg/dL (ref 0.0–0.3)
Indirect Bilirubin: 0.2 mg/dL — ABNORMAL LOW (ref 0.3–0.9)
Total Bilirubin: 0.3 mg/dL (ref 0.3–1.2)

## 2010-10-29 LAB — GLUCOSE, CAPILLARY: Glucose-Capillary: 105 mg/dL — ABNORMAL HIGH (ref 70–99)

## 2010-10-30 LAB — CBC
Platelets: 154 10*3/uL (ref 150–400)
RDW: 13.6 % (ref 11.5–15.5)
WBC: 5.2 10*3/uL (ref 4.0–10.5)

## 2010-10-30 LAB — BASIC METABOLIC PANEL
BUN: 12 mg/dL (ref 6–23)
Creatinine, Ser: 0.81 mg/dL (ref 0.4–1.2)
GFR calc non Af Amer: >60 mL/min (ref >60)
Glucose, Bld: 304 mg/dL — ABNORMAL HIGH (ref 70–99)

## 2010-10-31 ENCOUNTER — Encounter (INDEPENDENT_AMBULATORY_CARE_PROVIDER_SITE_OTHER): Payer: Worker's Compensation | Admitting: Psychology

## 2010-10-31 DIAGNOSIS — F0630 Mood disorder due to known physiological condition, unspecified: Secondary | ICD-10-CM

## 2010-10-31 DIAGNOSIS — F329 Major depressive disorder, single episode, unspecified: Secondary | ICD-10-CM

## 2010-11-13 ENCOUNTER — Encounter
Payer: Worker's Compensation | Attending: Physical Medicine & Rehabilitation | Admitting: Physical Medicine & Rehabilitation

## 2010-11-13 DIAGNOSIS — M531 Cervicobrachial syndrome: Secondary | ICD-10-CM

## 2010-11-13 DIAGNOSIS — G43019 Migraine without aura, intractable, without status migrainosus: Secondary | ICD-10-CM

## 2010-11-13 DIAGNOSIS — F341 Dysthymic disorder: Secondary | ICD-10-CM

## 2010-11-13 DIAGNOSIS — M5382 Other specified dorsopathies, cervical region: Secondary | ICD-10-CM

## 2010-11-13 DIAGNOSIS — Z79899 Other long term (current) drug therapy: Secondary | ICD-10-CM | POA: Insufficient documentation

## 2010-11-13 DIAGNOSIS — Z981 Arthrodesis status: Secondary | ICD-10-CM | POA: Insufficient documentation

## 2010-11-13 DIAGNOSIS — G43909 Migraine, unspecified, not intractable, without status migrainosus: Secondary | ICD-10-CM | POA: Insufficient documentation

## 2010-11-14 ENCOUNTER — Encounter (INDEPENDENT_AMBULATORY_CARE_PROVIDER_SITE_OTHER): Payer: Worker's Compensation | Admitting: Psychology

## 2010-11-14 DIAGNOSIS — F0630 Mood disorder due to known physiological condition, unspecified: Secondary | ICD-10-CM

## 2010-11-14 DIAGNOSIS — F329 Major depressive disorder, single episode, unspecified: Secondary | ICD-10-CM

## 2010-11-28 ENCOUNTER — Encounter (INDEPENDENT_AMBULATORY_CARE_PROVIDER_SITE_OTHER): Payer: Worker's Compensation | Admitting: Psychology

## 2010-11-28 DIAGNOSIS — F329 Major depressive disorder, single episode, unspecified: Secondary | ICD-10-CM

## 2010-11-28 DIAGNOSIS — F0630 Mood disorder due to known physiological condition, unspecified: Secondary | ICD-10-CM

## 2010-12-11 ENCOUNTER — Encounter: Payer: Worker's Compensation | Attending: Neurosurgery | Admitting: Neurosurgery

## 2010-12-11 DIAGNOSIS — F341 Dysthymic disorder: Secondary | ICD-10-CM

## 2010-12-11 DIAGNOSIS — R51 Headache: Secondary | ICD-10-CM | POA: Insufficient documentation

## 2010-12-11 NOTE — Assessment & Plan Note (Signed)
Monica [name] is back regarding her headaches.  She comes into the office literally in tears today.  She is seeing Dr. Arley Phenix in the [place], [place] and they are trying to work few things emotionally. She is having a lot of problems with her husband.  Some of these are related to her headaches, some of them are not.  Generally, the two relates to the other, however.  Her pain is 7-8/10, described as constant aching over the posterior aspect of her head, the cervical region and the frontotemporal areas.  Sleep is poor.  She is taking methadone as well as Actiq for pain control.  REVIEW OF SYSTEMS:  Notable for the above.  Full 12-point review is in the written health and history section of the chart.  SOCIAL HISTORY:  The patient is married, living with her husband and she states that they are not getting along well at all at this point.  PHYSICAL EXAMINATION:  VITAL SIGNS:  Blood pressure is 137/70, pulse 61, respiratory rate 18, she is satting 97% on room air. GENERAL:  The patient comes on room is very anxious and tearful.  It was hard to really make much out of physical exam today as she was so distraught that really we just focused on soothing and emotional support. HEART:  Regular rate. CHEST:  Clear. ABDOMEN:  Soft and nontender.  ASSESSMENT: 1. 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 2. Depression with anxiety which remains a big problem here and often     tell us around her relationship with her husband.  PLAN: 1. Continue neuropsychological followup.  I think her husband needs to     be involved as well.  He seems as if marital counseling is in     order, although I am not sure that the husband will buy into that.     At this point, I wonder if their relationship is a positive     influence in anyway for Billiejo. 2. I think it may be worthwhile for Therisa to see a Psychiatry again.     I will defer to Dr. Kieth Brightly  in this regard.  I asked the patient     to discuss with Dr. Kieth Brightly regarding other care.  I would be     happy to speak with him as well. 3. For now, we will introduce Klonopin 0.5 q.12 h. to help with her     anxiety as this is out of control at present.  This may help     somewhat with pain also. 4. I refilled her methadone 10 mg 1-2 t.i.d. #180, Actiq 1200 mcg one     daily p.r.n. severe headache. 5. Consider beta-blocker again such as Inderal. 6. The patient did not express any thoughts of harming herself or     suicidal ideations. 7. We will see her back here in about a month's time with nurse     practitioner.     Ranelle Oyster, M.D. Electronically Signed    ZTS/MedQ D:  11/13/2010 12:30:36  T:  11/14/2010 00:22:09  Job #:  [id number]

## 2010-12-12 NOTE — Assessment & Plan Note (Signed)
Monica [name] is a patient followed for headaches and depression by Dr. Verl Dicker, [facility] as well as [name] here in the Pain [facility].  She comes in today stating she has headaches almost constantly.  She has a pounding-type sensation when she bends over to do any kind of gardening.  She is tearful.  It is hard to conduct any kind of a meaningful conversation with her due to the fact that she is slightly depressed and anxious.  She has average pain level of 6-7, sharp, burning, constant aching. General activity level, she is rated at 9-10.  Her sleep is poor.  Pain is worse at night, bending and activities tend to hurt her.  Heat, pacing activities, and medicine tend to help.  She walks without assistance.  She does drive and climb steps.  She is retired.  She does have suicidal thoughts.  She tells me every day after she had a plan, she broke down in tears.  I told her if so she needs to possibly consider inpatient evaluation.  She declined.  She said she thought it was a bunch of BS and was not going to go back.  She talked to Dr. Kieth Brightly about it further.  REVIEW OF SYSTEMS:  Notable for those difficulties described in history of present illness, past medical history.  Review of systems is otherwise unremarkable.  SOCIAL HISTORY AND FAMILY HISTORY:  Unchanged.  PHYSICAL EXAMINATION:  Blood pressure is 110/64, pulse 52, respirations 18, O2 sats 94 on room air.  Her motor strength appears to be weak throughout just due to depression.  She does appear quite depressed. She is oriented x3 and constitutionally, she is within normal limits.  Her only request today was to fill her Actiq 1200 mcg 1 p.o. daily p.r.n. and gave her 30 with no refills.  She will follow up with nurse in 1 month.  I have encouraged her to go the emergency room should suicidal thoughts continue.     Javeon Macmurray L. Blima Dessert    RLW/MedQ D:  12/11/2010 11:33:47  T:  12/12/2010 00:53:52   Job #:  [id number]

## 2010-12-13 ENCOUNTER — Encounter (INDEPENDENT_AMBULATORY_CARE_PROVIDER_SITE_OTHER): Payer: Worker's Compensation | Admitting: Psychology

## 2010-12-13 DIAGNOSIS — F0630 Mood disorder due to known physiological condition, unspecified: Secondary | ICD-10-CM

## 2010-12-13 DIAGNOSIS — G43019 Migraine without aura, intractable, without status migrainosus: Secondary | ICD-10-CM

## 2010-12-13 DIAGNOSIS — M531 Cervicobrachial syndrome: Secondary | ICD-10-CM

## 2010-12-13 DIAGNOSIS — F329 Major depressive disorder, single episode, unspecified: Secondary | ICD-10-CM

## 2010-12-13 DIAGNOSIS — F4310 Post-traumatic stress disorder, unspecified: Secondary | ICD-10-CM

## 2010-12-13 DIAGNOSIS — F341 Dysthymic disorder: Secondary | ICD-10-CM

## 2010-12-13 DIAGNOSIS — M5382 Other specified dorsopathies, cervical region: Secondary | ICD-10-CM
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
## 2010-12-25 NOTE — Assessment & Plan Note (Signed)
Monica [name] is back regarding her headaches.  She reports over the last few  weeks the headaches have been a bit worse.  She does not know if it is  the weather, allergies, etc.  She does note that her Actiq had been  substituted out with generic and there is no significant difference with  the generic Fentanyl lollipops.  She rates her pain as 7 to 10 out of 10  and describes it as stabbing, sharp, constant, aching.  The pain  interferes with general activity, relationship with others, and  enjoyment of life on a moderate to severe level.   REVIEW OF SYSTEMS:  Notable for the above.  Full review is in the  written health and history section of the chart.   SOCIAL HISTORY:  Without specific change.  Her husband is with her today  and is supportive.   PHYSICAL EXAMINATION:  VITAL SIGNS:  Blood pressure 132/58, pulse 72,  respiratory rate 20, and she is saturating 100% on room air.  GENERAL:  The patient is pleasant in no acute distress.  Affect is  bright and she is not sensitive so much to light today.  HEART:  Regular.  CHEST:  Clear.  ABDOMEN:  Soft and nontender.  NEUROLOGY:  Cranial nerve examination is generally intact.  Cognitively  she is appropriate.  She has 5/5 strength, normal sensation and balance  today.   ASSESSMENT:  1. Chronic daily headaches with migrainous component.  2. Cervical dystonia with facet arthropathy.   PLAN:  1. We will give her a trial of Savella for her chronic headaches and      central sensitization.  We discussed the effects and side effects      today.  She is not currently on antidepressant so we should not      have problems tapering her off another medicine.  2. We will write for brand name Actiq.  This comes in #30 per box, so      we will provide her a 45 day supply.  3. Refill methadone #150.  4. Continue Imitrex p.r.n.  5. Ask the patient to hold off on Neurontin that Archer Asa, M.D.      prescribed recently while we start this new  medication.  I know      that we used Neurontin before without any specific benefit either      emotionally or from a pain standpoint.  6. I will see her back in about 1-2 months' time.      Ranelle Oyster, M.D.  Electronically Signed     ZTS/MedQ  D:  12/22/2007 12:17:42  T:  12/22/2007 12:48:27  Job #:  [id number]

## 2010-12-25 NOTE — Assessment & Plan Note (Signed)
Monica [name] is back regarding her headaches.  She has been generally stable  since I last saw her.  She gets by day to day with her methadone and  Actiq.  Headaches seem to be worse towards the end of the day and better  in the morning.  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  She rates her pain at a 6 out of 10,  describes it as constant and aching.  Pain interferes with general  activity, relations with others and enjoyment of life on a moderate to  severe level.  Sleep is fair.   REVIEW OF SYSTEMS:  Notable for the above.  Mood has been fair at this  point.   SOCIAL HISTORY:  Without significant change.  She still lives with her  husband.  She is working on getting her husband off cigarettes.   PHYSICAL EXAMINATION:  Blood pressure is 87/52, pulse is 82, respiratory  rate 18, she is sating 95% on room air.  Patient is pleasant, alert and  oriented x3, affect is bright and appropriate.  Gait is stable.  HEART:  Regular.  CHEST:  Clear.  ABDOMEN:  Soft, nontender.  He is a bit flat today but more or less appropriate.  Cognition is  intact.   ASSESSMENT:  1. Chronic daily headaches with migraine component.  2. Cervical dystonia.   PLAN:  1. I think the patient could benefit from holistic evaluation and      treatment at [facility] to assess her headaches and they      should look at things such as alphastimulation, biofeedback      therapy, relaxation techniques, etc.  Will submit a request to her      insurance company.  2. We refilled methadone #150 and Actiq 800 mcg #20 today.  3. Continue psychiatric followup.  4. Encourage activity as tolerated in and outside the home.  5. I will see her back in 3 months with nurse clinic followup in one      month's time.      Ranelle Oyster, M.D.  Electronically Signed     ZTS/MedQ  D:  06/12/2007 14:42:03  T:  06/13/2007 11:45:44  Job #:  [id number]

## 2010-12-25 NOTE — Assessment & Plan Note (Signed)
Monica [name] is back regarding her headaches and neck pain.  She is a bit  upset over some correspondence with our staff leading up this  appointment.  I was able to rectify some of what took place.  I will  call the patient later on today or tomorrow in that regard.  She is  having some more neck pain and she feels that even when she lies down on  the neck that symptoms in the neck trigger headaches.  She is doing  better when she side lies now.  She rates her pain a 7-8/10, described  as burning, stabbing, throbbing.  Pain interferes with general activity,  relations with others, and enjoyment of life on a mild-to-moderate  level.  Sleep is poor.  She remains on Actiq and methadone as previously  prescribed.  She continues with Topamax and Imitrex for breakthrough  headaches.   REVIEW OF SYSTEMS:  Notable for the above.  Full review is in the  written health and history section of the chart.   SOCIAL HISTORY:  The patient is married.   FAMILY HISTORY:  Notable for heart disease, diabetes, high blood  pressure.   PHYSICAL EXAMINATION:  Blood pressure 134/82, pulse 81, respiratory rate  is 16.  She is sating 96% on room air.  The patient is generally bright  and appropriate.  She is a bit anxious as usual.  Heart is regular.  Chest is clear.  Abdomen is soft, nontender.  She has pain over the  splenius capitis and upper trapezius muscles of the right side today.  She has pain with flexion and extension.  Shoulder range of motion is  normal.  No focal motor or sensory findings are noted in the upper  extremities today.   ASSESSMENT:  1. Chronic daily headaches with migrainous components.  2. Cervical dystonia and facet arthropathy.  3. History of depression and anxiety.   PLAN:  1. Refill Actiq 100 mcg #30 one daily p.r.n. severe headache.  2. Refill methadone 10 mg #180.  3. After informed consent, we injected the right splenius capitis and      trapezius muscles each with 2 mL of 1%  lidocaine.  The patient      tolerated it well.  4. Consider further cervical intervention.  We need to obtain an MRI      if one has not been done for some time apparently.  We will      establish follow up based on MRI results.  5. Continue Imitrex, Skelaxin, and Topamax.      Ranelle Oyster, M.D.  Electronically Signed     ZTS/MedQ  D:  11/14/2008 13:21:55  T:  11/15/2008 00:51:08  Job #:  [id number]

## 2010-12-25 NOTE — Assessment & Plan Note (Signed)
Monica [name] is back regarding her headaches.  She had to come off Savella  again as her teeth loosened and she had some bleeding which stopped  after cessation of the medication.  Her pain has been under fair  control.  She has been a little more active.  She was excited about  cooking Christmas dinner for her entire family.  Apparently, it was a  good experience overall.  She did have some pain and increased headache  afterwards that has since resolved.  She seems to be working within  limitations.  Her headache is better than she had in the past.  The pain  ranges from 6-7/10.  Pain is constant.  Pain interferes with sleep at  times.  Pain interferes with general activity, relationship with others,  and enjoyment of life on a moderate-to-severe level.   REVIEW OF SYSTEMS:  Notable for the above.   MEDICATIONS:  1. Actiq 800 mcg daily p.r.n. severe headaches.  2. Methadone 10 mg 1-2 t.i.d. and 2 at bedtime.  3. Topamax 1 q.a.m. and 2 at bedtime 100 mg.  4. Skelaxin 800 mg t.i.d.  5. Imitrex injectable in pill form p.r.n. severe headache.   REVIEW OF SYSTEMS:  Notable for the above.  Full review is in the  written health and history section.   SOCIAL HISTORY:  Unchanged.  She is living with her husband.  He has had  some health issues of his own.   PHYSICAL EXAMINATION:  VITAL SIGNS:  Blood pressure is 122/71, pulse 65,  respiratory rate 18, and she is sating 96% on room air.  GENERAL:  The patient is pleasant, alert, and oriented x3.  Affect is  bright and appropriate.  She is wearing sunglasses today, but seems to  be doing well overall.  HEART:  Regular.  CHEST:  Clear.  ABDOMEN:  Soft and nontender.  MUSCULOSKELETAL:  Cognition is appropriate.  She has a good neck range  of motion today.  Motor and sensory function are intact in both upper  and lower extremities.  Balance is good.  Cognition and insight were  within normal limits.   ASSESSMENT:  1. Chronic daily headaches with  migraine components.  2. Cervical dystonia and facet arthropathy.  3. History of depression related to headaches.   PLAN:  1. I think it is worthwhile trying Cymbalta low-dose 30 mg in      replacement of Savella to see if this benefits from headache      standpoint.  2. Refilled the Actiq 800 mcg, #30 and methadone 10 mg, #180.  3. Encouraged co-oriented behavior.  4. Continue Imitrex p.r.n. for breakthrough headaches with scheduled      Skelaxin and Topamax on board as well.  5. I will see her back in 3 months with nurse clinic followup in 1      month.      Ranelle Oyster, M.D.  Electronically Signed     ZTS/MedQ  D:  08/09/2008 12:40:29  T:  08/10/2008 04:44:29  Job #:  [id number]

## 2010-12-25 NOTE — Assessment & Plan Note (Signed)
The patient is back regarding her headaches and chronic pain.  She has  done very nicely with Savella with overall headaches a bit better and  mood and activity significantly increased.  She gone up to 50 mg b.i.d.  and this unfortunately increased her headaches and caused aching and  jerking, and made her feel a bit anxious.  She is back down to 25 mg  twice a day, this seems to be working extraordinarily well for her.  She  rates her pain right now 5/10, describes it as constant.  Headaches  bother her more in the evening and night hours.  She states that she has  been doing activities around the house that she has not done for years  now.   REVIEW OF SYSTEMS:  Notable for the above.  Full review is written out  in history section in the chart.   SOCIAL HISTORY:  The patient is married.  She is excited about her trip  upcoming in a week or two to [place].   PHYSICAL EXAMINATION:  VITAL SIGNS:  Blood pressure is 136/83, pulse of  90, and respiratory rate 20.  She is sating 98% on room air.  GENERAL:  The patient is pleasant, alert and oriented x3.  She is  brighter than I had seen her in sometime, if not ever before.  HEART:  Regular rate.  CHEST:  Clear.  ABDOMEN:  Soft and nontender.  NEUROLOGIC:  Cranial nerve exam is grossly intact.  Displayed normal  motor and sensory function today.  Balance is excellent.   ASSESSMENT:  1. Chronic daily headaches with migraine component.  2. Cervical dystonia and facet arthropathy.   PLAN:  1. Continue Savella 25 mg b.i.d.  2. Wrote brand name Actiq 800 mg #30, one daily p.r.n.  3. Methadone as previously ordered, 10 t.i.d. and 20 nightly.  4. Refilled Imitrex in brand name form as she states that there was a      substantial difference in efficacy with the generic form of the      medication.  5. I will see her back in about 3 months and nurse clinic followup in      1 month's time.  I am very pleased with the progress.      Ranelle Oyster, M.D.  Electronically Signed     ZTS/MedQ  D:  02/16/2008 10:04:56  T:  02/17/2008 03:46:27  Job #:  [id number]

## 2010-12-25 NOTE — Assessment & Plan Note (Signed)
Monica [name] is back regarding her headaches.  She notes some decline in her  functional abilities over the last couple months.  She feels that she is  very limited in her activities outside the home.  She went on a trip  recently and was limited in her walking and out of the house activities.  She remains on the same medication including methadone, Actiq, Imitrex.  She uses the lidocaine nasal spray as well with some results.  She finds  Skelaxin is helpful for her neck.  She rates her pain as 7/10, describes  as constant aching, stabbing.  Pain interferes with general activity,  relationships with others, and enjoyment of life on a moderate level.  Sleep is fair.   REVIEW OF SYSTEMS:  Notable for the above.  Full review is in the  written health and history section of the chart.   SOCIAL HISTORY:  Not specific in change.   PHYSICAL EXAM:  Blood pressure is 109/67.  Pulse is 77.  Respiratory  rate 18.  She is satting 95% on room air.  Patient is pleasant, alert  and oriented x3.  Affect is fairly bright.  She is not photophobic  today.  Gait is stable.  HEART:  Regular.  CHEST:  Clear.  ABDOMEN:  Soft and nontender.  Cognitively, she is appropriate.  Cranial nerve exam is intact.   ASSESSMENT:  1. Chronic daily headaches with migraine component.  2. Cervical dystonia with facet arthropathy.   PLAN:  1. I think she could benefit from integrative therapies and their      holistic evaluation and treatment.  I think we need to look at      alternative treatments for [facility] who has become so sensitized to      her noxious stimuli that she is rather limited in all aspects of      her daily living.  We will try another request to her insurance      company.  2. Continue methadone and Actiq as previously dosed.  We refilled      methadone today.  3. Continue Imitrex.  4. Consider trial of Zanaflex capsules 2 mg every 8 hours with food.  5. I will see her back in 3 months with nurse clinic  followup in 1      month's time.      Ranelle Oyster, M.D.  Electronically Signed     ZTS/MedQ  D:  09/22/2007 11:09:19  T:  09/23/2007 11:29:17  Job #:  [id number]

## 2010-12-25 NOTE — Assessment & Plan Note (Signed)
Monica [name] is back regarding her chronic headache.  She has been about  stable from her last visit.  She is very limited sometimes in the  afternoons due to her headache symptoms.  She tries to get out in the  yard and do some gardening and other leisure activities which help.  She  does things with her husband as well.  It seems as if their relationship  is getting a bit better.  She continues to follow up with Dr. Donell Beers  for psychiatric care.  She remains on methadone t.i.d. and q.h.s. as  well as her Actiq for breakthrough pain every other day.  The patient  rates her pain as 6 out of 10. She describes it as sharp, burning,  stabbing, constant aching.  It interferes with general activity,  relations with others, and enjoyment of life on a moderate to severe  level.   REVIEW OF SYSTEMS:  Pertinent positives listed above and full review is  in the health and history section.   SOCIAL HISTORY:  Without change and other issues are noted above.   PHYSICAL EXAMINATION:  Blood pressure is 102/55, pulse 62, respiratory  rate 16, she is saturating 94% on room air. The patient is generally  pleasant.  Affect generally appropriate as well.  Gait stable.  The  heart is regular rate.  Chest is clear.  Abdomen is soft, nontender.  She has good coordination with normal sensation, reflexes, and muscle  strength today.  Neck:  Range of motion is a bit inhibited and she has  some difficulty with flexion and rotation to either side.  Cavities are  tender in the occipital region as well as the temples.  The neck was  slightly tender to touch as well today.   ASSESSMENT:  1. Chronic daily headaches with migraine headaches as well.  2. Cervical dystonia.   PLAN:  1. Continue with methadone daily as well as Skelaxin for spasm.  I      increased her Actiq slightly so that she can have two every 3 days.      She was dispensed 20 of the 800 mcg Actiq today.  2. Continue psychological followup.  3. I will  see her back in 3 months' time.  She will see the Nurse      Clinic in 1 month.      Ranelle Oyster, M.D.  Electronically Signed     ZTS/MedQ  D:  12/17/2006 12:45:15  T:  12/17/2006 13:36:40  Job #:  [id number]

## 2010-12-25 NOTE — Assessment & Plan Note (Signed)
Monica [name] is back regarding her headaches and cervicalgia.  I had sent her  for an MRI of the neck which was not completed due to anxiety.  We had  called in some Valium to allow her to complete the study, and apparently  the report was not sent back to [place] or any hard copies.  The one that was  sent to [place] showed osteoarthritis and some disk disease in the areas that  could be visualized.  The patient did go recently for a dental procedure  and is having some discomfort related to that.  The doctor put her on  ketoprofen, and she notes that this has helped her neck quite a bit.  The patient has reported that the Skelaxin was not helping as much with  her neck pain, and she is having a lot of pain when in the supine  position and trying to sleep.  She rates her pain a 6-7/10 today,  described it as constant and aching.  Pain interferes with general  activity, relations with others, enjoyment of life on a moderate level.  Sleep is fair.   REVIEW OF SYSTEMS:  Notable for the above.  Full 14-point review is in  the written health and history section of the chart.   SOCIAL HISTORY:  Unchanged.  She is married.  She and her husband are  going on vacation soon which she is excited about.   PHYSICAL EXAMINATION:  Blood pressure is 112/66, pulse is 63,  respiratory rate 18.  She is sating 94% on room air.  The patient is  pleasant, alert and oriented x3.  She has limited vocalization due to  the tenderness in her mouth.  She has some pain still once again over  the splenius capitis, trapezius, paraspinal musculature of the mid to  upper cervical spine.  Shoulder strength and range of motion is normal.  Neck range of motion essentially is unchanged with more pain with  extension and flexion today.  Neurologically, no changes are noted.   ASSESSMENT:  1. Chronic daily headaches with migrainous components.  2. Cervical dystonia and facet arthropathy.  3. History of depression and anxiety.    PLAN:  1. Refilled Actiq 100 mcg #30 and methadone 10 mg #180 today.  2. Await MRI report.  3. I am okay with her being on ketoprofen at least for the time being.      She feels this is helpful as definitely a bone is here.  4. We will stop the Skelaxin again and trial Zanaflex 2 mg 1 q.12      hours p.r.n.  Observe for tolerance.  5. Continue Imitrex and Topamax as ordered.  I will see her back in 3      months with me, 1 month with Nursing.      Monica [name], M.D.  Electronically Signed     ZTS/MedQ  D:  01/06/2009 13:59:43  T:  01/07/2009 04:24:58  Job #:  [id number]

## 2010-12-25 NOTE — Assessment & Plan Note (Signed)
Monica [name] is back regarding her headache.  She tried doubling up on her  morning methadone and skipping her evening dose, and this seems to be  working a bit better for her daytime activity.  Pain has been stable at  a 6-7/10.  Pain is worse generally in the evenings now.  She uses her  Actiq for breakthrough pain and essentially a cool down type of regimen  in the evening to help with headaches.  Sleep is fair.  She sees Dr.  Leonides Cave for psychological support and finds him very helpful.   REVIEW OF SYSTEMS:  Notable for the above.  Full review is in the Health  and History section of the chart.   SOCIAL HISTORY:  No significant changes.  The patient recently went on a  trip and found this beneficial.  She stays active with gardening which  helps her as well.   PHYSICAL EXAMINATION:  VITAL SIGNS:  Blood pressure 101/52, pulse 74,  respiratory rate 18, saturating 94% on room air.  GENERAL:  The patient is alert and oriented x3.  Affect is bright and  appropriate.  Gait is stable.  HEART:  Regular.  CHEST:  Clear.  ABDOMEN:  Soft, nontender.  NECK:  Range of motion in the neck is generally stable.  There is some  generalized cervical occipital tenderness on exam.   ASSESSMENT:  1. Chronic daily headaches with migraine headaches as well.  2. Cervical dystonia.   PLAN:  1. Continue current methadone, with patient having freedom to double      up in the morning if she sees fit.  Dispensed #150 today.  2. Actiq for breakthrough pain 800 mcg one daily p.r.n., #20.  3. Continue psychological and psychiatric followup.  4. I will see her back in 3 months with nurse clinic followup in 1      month's time.      Ranelle Oyster, M.D.  Electronically Signed     ZTS/MedQ  D:  03/16/2007 11:09:30  T:  03/16/2007 11:37:50  Job #:  [id number]

## 2010-12-28 ENCOUNTER — Encounter (INDEPENDENT_AMBULATORY_CARE_PROVIDER_SITE_OTHER): Payer: Worker's Compensation | Admitting: Psychology

## 2010-12-28 DIAGNOSIS — F329 Major depressive disorder, single episode, unspecified: Secondary | ICD-10-CM

## 2010-12-28 DIAGNOSIS — F0630 Mood disorder due to known physiological condition, unspecified: Secondary | ICD-10-CM
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
## 2010-12-28 NOTE — Procedures (Signed)
Monica [name], Monica [name]               ACCOUNT NO.:  [id number]   MEDICAL RECORD NO.:  [id number]          PATIENT TYPE:  REC   LOCATION:  TPC                          FACILITY:  [facility]   PHYSICIAN:  Ranelle Oyster, M.D.DATE OF BIRTH:  Dec 19, 1950   DATE OF PROCEDURE:  DATE OF DISCHARGE:                               OPERATIVE REPORT   PROCEDURE:  Botulinum toxin injection.   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   After informed consent and preparation of the skin with isopropyl  alcohol, we injected multiple muscles, including the left levator  scapula, left sternocleidomastoid, each with approximately 20-40 units  of Botox respectively.  Initially, we injected the left splenius capitus  with 20 units of Botox and the right sternocleidomastoid with 20 units  of Botox.  The patient tolerated it well without issues.  We cleaned the  skin and dressed as appropriate after injection today.   I refilled Actiq and methadone today, both of which will not be due  again until August 30, 2005.   The patient will follow up in about six weeks time for reassessment.      Ranelle Oyster, M.D.  Electronically Signed     ZTS/MEDQ  D:  07/18/2006 16:29:17  T:  07/19/2006 09:36:27  Job:  [id number]

## 2010-12-28 NOTE — Discharge Summary (Signed)
NAMEGUSTAVO, MEDITZ NO.:  [id number]   MEDICAL RECORD NO.:  [id number]                   PATIENT TYPE:  IPS   LOCATION:  0508                                 FACILITY:  [facility]   PHYSICIAN:  Geoffery Lyons, M.D.                   DATE OF BIRTH:  04/20/1951   DATE OF ADMISSION:  08/26/2003  DATE OF DISCHARGE:  08/29/2003                                 DISCHARGE SUMMARY   CHIEF COMPLAINT AND PRESENTING ILLNESS:  This was the first admission to  [facility]  for this 60 year old married female who when  she saw her psychiatrist the day before acknowledged recurrent suicidal  ideas with attempts on December 23 and December 30.  She had come off her  medications after overdosing.  Her psychiatrist brought her to [facility] where she was voluntarily admitted.  Apparently she  had trauma to her head, like 6 years prior to this admission, suffered a  fractured neck, had a cervical fusion, and has been suffering from PTSD.   PAST PSYCHIATRIC HISTORY:  Had been seeing Dr. Donell Beers and Dr. Leonides Cave for  therapy and sees Dr. Harold Hedge for chronic pain.   ALCOHOL AND DRUG HISTORY:  Denies the use or abuse of any substances.   PAST MEDICAL HISTORY:  As already stated, head trauma with neck fusion,  chronic pain.   MEDICATIONS:  1. Methadone 10 mg twice a day.  2. Keppra 500 mg 3 times a day.  3. __________ 50 mg daily.  4. Topamax 100 mg 2 in the morning and 3 at 6 p.m.  5. Paxil CR 25 mg 2 at bedtime.  6. Imitrex 100 as needed for headache.   PHYSICAL EXAMINATION:  Performed, failed to show any acute findings.   MENTAL STATUS EXAM:  Reveals an alert, oriented female, normally groomed and  dressed.  Speech at a normal rate, rhythm and tone.  Mood was anxious,  affect was labile, tears up and cries in the office.  Thought processes were  clear and rational, goal oriented.  Cognition well preserved.  She denied  auditory or visual hallucinations.  She claimed that for no apparent reason  she felt paranoid but denied any active suicidal or homicidal ideations.   ADMISSION DIAGNOSES:   AXIS I:  1. Major depression, recurrent.  2. Post-traumatic stress disorder.   AXIS II:  No diagnosis.   AXIS III:  Epilepsy, non-insulin-dependent diabetes mellitus, chronic  headache.   AXIS IV:  Moderate.   AXIS V:  Global assessment of function upon admission 30, highest global  assessment of function in past year 65.   LABORATORY DATA:  CBC within normal limits.  Blood chemistries were within  normal limits.  Thyroid profile within normal limits.  Drug screen positive  for methadone which she has been prescribed.   COURSE IN HOSPITAL:  She  was admitted and started on intensive individual  and group psychotherapy.  She was given Ambien for sleep, Ativan for  anxiety.  She was maintained on Paxil CR 50 mg per day, methadone 30 mg  twice a day, Keppra 500 mg 3 times a day, Topamax 200 in the morning and 300  at night, Imitrex 100 every 8 hours as needed, Toradol 30 mg IM every 6  hours, Actos 60 mg daily.  She was initially given a trial with Abilify and  this was discontinued and she was placed on Lamictal 25 mg every other day  and she was started on Risperdal 0.5 at night and Restoril 7.5 at bedtime.  Endorsed old health difficulties, headache for 6 years.  Initially very  isolated, not feeling that she could go to groups, increased anxiety outside  of her room, felt that she could function within the walls of her room  better.  Her husband visited and started feeling better.  She stated that  she was wanting to go home, claimed that her husband would control the  medications.  She felt that being admitted was not helping her because she  did not like to be around people.  She denied any suicidal or homicidal  ideation.  The husband was to keep her drug supply.  By January 17, she was  in full  contact with reality.  She admitted that she was just too  overwhelmed by being in the unit, being able to talk and have the session  with the husband, she felt much better.  She said that she was able to deal  with the chronic headache better.  She was sleeping.  She was less depressed  and she was wanting to be discharged.  Family session with the husband went  well.  They both felt that she was ready to go.  We went ahead and  discharged to outpatient follow-up.   DISCHARGE DIAGNOSES:   AXIS I:  1. Major depression, recurrent.  2. Post-traumatic stress disorder.  3. Chronic pain.   AXIS II:  No diagnosis.   AXIS III:  Epilepsy, non-insulin-dependent diabetes mellitus, chronic  headaches.   AXIS IV:  Moderate.   AXIS V:  Global assessment of function upon discharge 50.   DISCHARGE MEDICATIONS:  1. Methadone 30 twice a day.  2. Keppra 500 1 three times a day.  3. Topamax 100 2 at 9 p.m.  4. Actos 50 mg daily.  5. Paxil CR 25 mg 2 daily.  6. Restoril 7.5 at night.  7. Risperdal 0.5 at bedtime.  8. Lamictal 25 every other day until January 29, from then on one daily.  9. Ativan 0.5 every 6 hours as needed.   DISPOSITION:  Follow up scheduled with Dr. Donell Beers as well as Dr. Eula Flax.                                               Geoffery Lyons, M.D.    IL/MEDQ  D:  09/06/2003  T:  09/07/2003  Job:  [id number]

## 2011-01-11 ENCOUNTER — Encounter (INDEPENDENT_AMBULATORY_CARE_PROVIDER_SITE_OTHER): Payer: Worker's Compensation | Admitting: Psychology

## 2011-01-11 DIAGNOSIS — F0630 Mood disorder due to known physiological condition, unspecified: Secondary | ICD-10-CM

## 2011-01-11 DIAGNOSIS — F329 Major depressive disorder, single episode, unspecified: Secondary | ICD-10-CM

## 2011-01-31 ENCOUNTER — Encounter: Payer: Worker's Compensation | Attending: Physical Medicine & Rehabilitation

## 2011-01-31 DIAGNOSIS — R51 Headache: Secondary | ICD-10-CM | POA: Insufficient documentation

## 2011-01-31 DIAGNOSIS — M5382 Other specified dorsopathies, cervical region: Secondary | ICD-10-CM

## 2011-01-31 DIAGNOSIS — F341 Dysthymic disorder: Secondary | ICD-10-CM | POA: Insufficient documentation

## 2011-01-31 DIAGNOSIS — M531 Cervicobrachial syndrome: Secondary | ICD-10-CM

## 2011-01-31 DIAGNOSIS — G43019 Migraine without aura, intractable, without status migrainosus: Secondary | ICD-10-CM

## 2011-01-31 DIAGNOSIS — F4310 Post-traumatic stress disorder, unspecified: Secondary | ICD-10-CM

## 2011-02-28 ENCOUNTER — Encounter: Payer: Worker's Compensation | Attending: Physical Medicine & Rehabilitation | Admitting: Neurosurgery

## 2011-02-28 DIAGNOSIS — Z79899 Other long term (current) drug therapy: Secondary | ICD-10-CM | POA: Insufficient documentation

## 2011-02-28 DIAGNOSIS — R51 Headache: Secondary | ICD-10-CM

## 2011-02-28 DIAGNOSIS — G43909 Migraine, unspecified, not intractable, without status migrainosus: Secondary | ICD-10-CM | POA: Insufficient documentation

## 2011-02-28 DIAGNOSIS — M542 Cervicalgia: Secondary | ICD-10-CM

## 2011-02-28 DIAGNOSIS — F341 Dysthymic disorder: Secondary | ICD-10-CM | POA: Insufficient documentation

## 2011-02-28 DIAGNOSIS — Z981 Arthrodesis status: Secondary | ICD-10-CM | POA: Insufficient documentation

## 2011-03-01 NOTE — Assessment & Plan Note (Signed)
Monica [name] is a patient of Dr. Riley [name], seen here for complaints of headaches and neck pain due to a neck fracture.  She is well healed from that, but she still rates her pain at 8 or 9.  Her activity level is 10. She has sharp, burning pain that is constant.  Pain is worse during the day.  Sleep patterns are poor to fair.  Walking, bending, most activities aggravate; rest, heat, and medication tend to help. Mobility, she is independent.  She can drive and climb steps.  REVIEW OF SYSTEMS:  Notable for the difficulties as described above as well as some tremor, dizziness, depression, anxiety.  She does have suicidal thoughts with no plan.  She has some GI issues from time to time.  She has no aberrant behaviors.  PAST MEDICAL HISTORY:  Unchanged.  SOCIAL HISTORY:  Unchanged.  FAMILY HISTORY:  Unchanged.  PHYSICAL EXAMINATION:  VITAL SIGNS:  Her blood pressure is 118/57, pulse 50, respirations 16, O2 sats 92 on room air. CONSTITUTIONAL:  She is within normal limits.  She is alert and oriented.  She does not appear depressed. MUSCULOSKELETAL:  Today, her gait is normal.  Her motor strength is good.  Her sensation is intact.  ASSESSMENT: 1. History of cervical fracture as a workers' comp injury. 2. Resultant headaches.  She also is to follow up with Dr. Kieth [name]     as needed. 3. Plan will be to refill her Actiq 1200 mg 1 p.o. daily p.r.n. #30     with no refill. 4. Methadone 10 mg 1-2 p.o. t.i.d. 180 with no refill.  She will call     for assistance.  Report the emergency room with any thoughts of     suicidal ideation, otherwise she will follow up here in the clinic     in 1 month.     Monica [name] L. Blima Dessert Electronically Signed    RLW/MedQ D:  02/28/2011 11:15:54  T:  02/28/2011 25:36:64  Job #:  [id number]

## 2011-04-03 ENCOUNTER — Encounter: Payer: Worker's Compensation | Admitting: Physical Medicine & Rehabilitation

## 2011-04-26 ENCOUNTER — Encounter
Payer: Worker's Compensation | Attending: Physical Medicine & Rehabilitation | Admitting: Physical Medicine & Rehabilitation

## 2011-04-26 DIAGNOSIS — F329 Major depressive disorder, single episode, unspecified: Secondary | ICD-10-CM

## 2011-04-26 DIAGNOSIS — F4310 Post-traumatic stress disorder, unspecified: Secondary | ICD-10-CM

## 2011-04-26 DIAGNOSIS — M129 Arthropathy, unspecified: Secondary | ICD-10-CM | POA: Insufficient documentation

## 2011-04-26 DIAGNOSIS — M542 Cervicalgia: Secondary | ICD-10-CM

## 2011-04-26 DIAGNOSIS — F3289 Other specified depressive episodes: Secondary | ICD-10-CM | POA: Insufficient documentation

## 2011-04-26 DIAGNOSIS — G43019 Migraine without aura, intractable, without status migrainosus: Secondary | ICD-10-CM

## 2011-04-26 DIAGNOSIS — R51 Headache: Secondary | ICD-10-CM | POA: Insufficient documentation
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
## 2011-05-15 ENCOUNTER — Other Ambulatory Visit: Payer: Self-pay | Admitting: Internal Medicine

## 2011-05-15 NOTE — Telephone Encounter (Signed)
Please sched FU OV GERD

## 2011-05-20 NOTE — Telephone Encounter (Signed)
Pt is aware of OV for 10/10 at 0930 with KJ

## 2011-05-21 ENCOUNTER — Encounter
Payer: Worker's Compensation | Attending: Physical Medicine & Rehabilitation | Admitting: Physical Medicine & Rehabilitation

## 2011-05-21 DIAGNOSIS — G43019 Migraine without aura, intractable, without status migrainosus: Secondary | ICD-10-CM

## 2011-05-21 DIAGNOSIS — R51 Headache: Secondary | ICD-10-CM | POA: Insufficient documentation

## 2011-05-21 DIAGNOSIS — M5382 Other specified dorsopathies, cervical region: Secondary | ICD-10-CM

## 2011-05-21 DIAGNOSIS — G8929 Other chronic pain: Secondary | ICD-10-CM | POA: Insufficient documentation

## 2011-05-21 DIAGNOSIS — M129 Arthropathy, unspecified: Secondary | ICD-10-CM | POA: Insufficient documentation

## 2011-05-21 DIAGNOSIS — F4310 Post-traumatic stress disorder, unspecified: Secondary | ICD-10-CM

## 2011-05-21 DIAGNOSIS — F3289 Other specified depressive episodes: Secondary | ICD-10-CM | POA: Insufficient documentation

## 2011-05-21 DIAGNOSIS — M542 Cervicalgia: Secondary | ICD-10-CM | POA: Insufficient documentation

## 2011-05-21 DIAGNOSIS — F329 Major depressive disorder, single episode, unspecified: Secondary | ICD-10-CM | POA: Insufficient documentation

## 2011-05-21 DIAGNOSIS — M531 Cervicobrachial syndrome: Secondary | ICD-10-CM

## 2011-05-21 NOTE — Assessment & Plan Note (Signed)
Monica [name] is here regarding her headaches and chronic pain.  We made a referral to Psychiatry but the specialists referred to do not take worker's comp.  She is now seeing a specialist in the [place] area for an evaluation.  Pain continues to be poorly controlled in the 7-8/10.  Pain is constant.  Sleep is poor.  She has tried increasing the Klonopin per my instructions last visit but it made her groggy the following day, so she is back off to the 0.5 q.12 hours.  She is looking at a specialist in [place] who practices some alternative medicine and perhaps will be seeing them soon.  She is also going to follow up with Neurology for a new perspective.  REVIEW OF SYSTEMS:  Notable for the above.  Full 12-point review is in the written health and history section of chart.  She reports depression, anxiety, and suicidal thoughts, although no thoughts of following through with these ideations.  SOCIAL HISTORY:  The patient is married.  She is expecting a new grandchild soon.  PHYSICAL EXAMINATION:  VITAL SIGNS:  Blood pressure 118/65, pulse 56, respiratory rate 16.  She is satting 98% on room air. GENERAL:  The patient is generally pleasant.  She is more calm today than she was at last visit.  She is alert and oriented x3.  She is having "splitting headache."  She is not photophobic today.  NEUROLOGIC: Strength is grossly 5/5.  Normal sensory function. HEART:  Regular. CHEST:  Clear. ABDOMEN:  Soft nontender.  ASSESSMENT: 1. History of cervicalgia with facet arthropathy and chronic     headaches. 2. Posttraumatic stress disorder and depression.  PLAN: 1. Await psychiatry evaluation in [place]. 2. I am open to any type of evaluation including Neurology.  I am not     sure what else to offer her from my standpoint. 3. We did discuss the trial of Pristiq which will start today 50 mg     q.a.m. to help with mood and perhaps headaches.  We discussed     potential effects and side effects  today. 4. The patient will pursue alternative treatments through the     practitioner in [place].  I dissuaded her from using any type     of chiropractory for her neck pain. 5. We will see her back in 2 months.  I allowed her to come pick up     her prescriptions in about a month when they are due.     Ranelle Oyster, M.D. Electronically Signed    ZTS/MedQ D:  05/21/2011 11:15:01  T:  05/21/2011 19:07:02  Job #:  [id number]

## 2011-05-22 ENCOUNTER — Ambulatory Visit: Payer: Self-pay | Admitting: Urgent Care

## 2011-05-31 ENCOUNTER — Encounter: Payer: Self-pay | Admitting: Urgent Care

## 2011-05-31 ENCOUNTER — Ambulatory Visit (INDEPENDENT_AMBULATORY_CARE_PROVIDER_SITE_OTHER): Payer: Medicare Other | Admitting: Urgent Care

## 2011-05-31 DIAGNOSIS — IMO0001 Reserved for inherently not codable concepts without codable children: Secondary | ICD-10-CM

## 2011-05-31 DIAGNOSIS — R143 Flatulence: Secondary | ICD-10-CM

## 2011-05-31 DIAGNOSIS — R1084 Generalized abdominal pain: Secondary | ICD-10-CM

## 2011-05-31 DIAGNOSIS — R110 Nausea: Secondary | ICD-10-CM

## 2011-05-31 DIAGNOSIS — K219 Gastro-esophageal reflux disease without esophagitis: Secondary | ICD-10-CM

## 2011-05-31 HISTORY — DX: Gastro-esophageal reflux disease without esophagitis: K21.9

## 2011-05-31 MED ORDER — PANTOPRAZOLE SODIUM 40 MG PO TBEC: 40.0000 mg | DELAYED_RELEASE_TABLET | Freq: Every day | ORAL | Status: DC

## 2011-05-31 NOTE — Patient Instructions (Addendum)
Continue protonix 40 mg daily Stop trubiotic Trial of ALIGN daily Avoid dairy products including milk, yogurt, ice cream, and cheeses for 2 weeks. If you remain on lactose free diet, you will need to take daily calcium 1200mg  elemental Next colonoscopy 11/2013  Lactose-Free Diet Lactose is a carbohydrate that is found mainly in milk and milk products, as well as in foods with added milk or whey. Lactose must be digested by the enzyme lactase in order to be used by the body. Lactose intolerance occurs when there is a shortage of lactase. When your body is not able to digest lactose, you may feel sick to your stomach (nausea), bloated, and have cramps, gas, and diarrhea. TYPES OF LACTASE DEFICIENCY  Primary lactase deficiency. This is the most common type. It is characterized by a slow decrease in lactase activity.   Secondary lactase deficiency. This occurs following injury to the small intestinal mucosa as a result of a disease or condition. It can also occur as a result of surgery or after treatment with antibiotic medicines or cancer drugs.  Tolerance to lactose varies widely. Each person must determine how much milk can be consumed without developing symptoms. Drinking smaller portions of milk throughout the day may be helpful. Some studies suggest that slowing gastric emptying may help increase tolerance of milk products. This may be done by:  Consuming milk or milk products with a meal rather than alone.   Consuming milk with a higher fat content.  There are many dairy products that may be tolerated better than milk by some people, including:  Cheese (especially aged cheese). The lactose content is much lower than in milk.   Cultured dairy products, such as yogurt, buttermilk, cottage cheese, and sweet acidophilus milk (kefir). These products are usually well tolerated by lactase-deficient people. This is because the healthy bacteria help digest lactose.   Lactose-hydrolyzed milk. This  product contains 40% to 90% less lactose than milk and may also be well tolerated.  ADEQUACY These diets may be deficient in calcium, riboflavin, and vitamin D, according to the Recommended Dietary Allowances of the [employer]. Depending on individual tolerances and the use of milk substitutes, milk, or other dairy products, you may be able to meet these recommendations. SPECIAL NOTES  Lactose is a carbohydrate. The main food source for lactose is dairy products. Reading food labels is important. Many products contain lactose even when they are not made from milk. Look for the following words: whey, milk solids, dry milk solids, nonfat dry milk powder. Typical sources of lactose other than dairy products include breads, candies, cold cuts, prepared and processed foods, and commercial sauces and gravies.   All foods must be prepared without milk, cream, or other dairy foods.   A vitamin or mineral supplement may be necessary. Consult your caregiver or Registered Dietitian.   Lactose is also found in many prescription and over-the-counter medicines.   Soy milk and lactose-free supplements may be used as an alternative to milk.  CHOOSING FOODS Breads and Starches  Allowed: Breads and rolls made without milk. [place], [place], or [place] bread. Soda crackers, graham crackers. Any crackers prepared without lactose. Cooked or dry cereals prepared without lactose (read labels). Any potatoes, pasta, or rice prepared without milk or lactose. Popcorn.   Avoid: Breads and rolls that contain milk. Prepared mixes such as muffins, biscuits, waffles, pancakes. Sweet rolls, donuts, [place] toast (if made with milk or lactose). Zwieback crackers, corn curls, or any crackers that contain lactose. Cooked  or dry cereals prepared with lactose (read labels). Instant potatoes, frozen [place] fries, scalloped or au gratin potatoes.  Vegetables  Allowed: Fresh, frozen, and canned vegetables.   Avoid:  Creamed or breaded vegetables. Vegetables in a cheese sauce or with lactose-containing margarines.  Fruit  Allowed: All fresh, canned, or frozen fruits that are not processed with lactose.   Avoid: Any canned or frozen fruits processed with lactose.  Meat and Meat Substitutes  Allowed: Plain beef, chicken, fish, [place], lamb, veal, pork, or ham. Kosher prepared meat products. Strained or junior meats that do not contain milk. Eggs, soy meat substitutes, nuts.   Avoid: Scrambled eggs, omelets, and souffles that contain milk. Creamed or breaded meat, fish, or fowl. Sausage products such as wieners, liver sausage, or cold cuts that contain milk solids. Cheese, cottage cheese, or cheese spreads.  Milk  Allowed: None.   Avoid: Milk (whole, 2%, skim, or chocolate). Evaporated, powdered, or condensed milk. Malted milk.  Soups and Combination Foods  Allowed: Bouillon, broth, vegetable soups, clear soups, consomms. Homemade soups made with allowed ingredients. Combination or prepared foods that do not contain milk or milk products (read labels).   Avoid: Cream soups, chowders, commercially prepared soups containing lactose. Macaroni and cheese, pizza. Combination or prepared foods that contain milk or milk products.  Desserts and Sweets  Allowed: Water and fruit ices, gelatin, angel food cake. Homemade cookies, pies, or cakes made from allowed ingredients. Pudding (if made with water or a milk substitute). Lactose-free tofu desserts. Sugar, honey, corn syrup, jam, jelly, marmalade, molasses (beet sugar). Pure sugar candy, marshmallows.   Avoid: Ice cream, ice milk, sherbet, custard, pudding, frozen yogurt. Commercial cake and cookie mixes. Desserts that contain chocolate. Pie crust made with milk-containing margarine. Reduced calorie desserts made with a sugar substitute that contains lactose. Toffee, peppermint, butterscotch, chocolate, caramels.  Fats and Oils  Allowed: Butter (as tolerated,  contains very small amounts of lactose). Margarines and dressings that do not contain milk. Vegetable oils, shortening, mayonnaise, nondairy cream and whipped toppings without lactose or milk solids added. Tomasa Blase.   Avoid: Margarines and salad dressings containing milk. Cream, cream cheese, peanut butter with added milk solids, sour cream, chip dips made with sour cream.  Beverages  Allowed: Carbonated drinks, tea, coffee and freeze-dried coffee, some instant coffees (check labels). Fruit drinks, fruit and vegetable juice, rice or soy milk.   Avoid: Hot chocolate. Some cocoas, some instant coffees, instant iced teas, powdered fruit drinks (read labels).  Condiments  Allowed: Soy sauce, carob powder, olives, gravy made with water, baker's cocoa, pickles, pure seasonings and spices, wine, pure monosodium glutamate, catsup, mustard.   Avoid: Some chewing gums, chocolate, some cocoas. Certain antibiotics and vitamin or mineral preparations. Spice blends if they contain milk products. MSG extender. Artificial sweeteners that contain lactose. Some nondairy creamers (read labels).  SAMPLE MENU Breakfast  Orange juice.   Banana.   Bran cereal.   Nondairy creamer.   Vienna bread, toasted.   Butter or milk-free margarine.   Coffee or tea.  Lunch  Chicken breast.   Rice.   Green beans.   Butter or milk-free margarine.   Fresh melon.   Coffee or tea.  Dinner  [place].   Baked potato.   Butter or milk-free margarine.   Broccoli.   Lettuce salad with vinegar and oil dressing.   [employer].   Coffee or tea.  Document Released: 01/18/2002 Document Revised: 04/10/2011 Document Reviewed: 10/26/2010 [facility] Patient Information 2012 [employer]. 

## 2011-05-31 NOTE — Assessment & Plan Note (Signed)
Doing well on Protonix 40 mg daily. 

## 2011-05-31 NOTE — Assessment & Plan Note (Signed)
Monica [name] is a 60 y.o. female with history of chronic abdominal bloating. Recent GYN exam and Pap smear are reassuring. I suspect her chronic slow transit with chronic narcotic therapy could be contributing to her abdominal bloating. We should rule out celiac disease. Also, lactose intolerance and small bowel bacterial overgrowth remain in the differential. Colonoscopy was in 2010 by Dr. Karilyn Cota and normal except for hemorrhoids.  Stop trubiotic Trial of ALIGN daily Avoid dairy products including milk, yogurt, ice cream, and cheeses for 2 weeks. If you remain on lactose free diet, you will need to take daily calcium 1200mg  elemental Next colonoscopy 11/2013 Lactose-Free Diet instructions given

## 2011-05-31 NOTE — Assessment & Plan Note (Signed)
Chronic abdominal pain and bloating. Previous benign workup. Suspect functional abdominal pain, IBS, possible lactose intolerance or small bowel bacterial overgrowth.

## 2011-05-31 NOTE — Assessment & Plan Note (Signed)
Transaminases had normalized in August 2011. Patient reports she had recent abnormal labs. We'll request lab work from Dr. Margaretmary Eddy office.

## 2011-05-31 NOTE — Progress Notes (Signed)
Referring Provider: Ranelle Oyster, MD Primary Care Physician:  Kirstie Peri, MD Primary Gastroenterologist:  Dr. Jena Gauss  Chief Complaint  Patient presents with  . Follow-up    alot of gas real bad    HPI:  Monica [name] is a 60 y.o. female here for follow up for abdominal bloating & gas.  C/o bloating.  Hx chronic pain.  Occasional intermittent pain in bilat lower abdomen.  C/o anorexia at times because of bloating.  Wt down 5# in past yr.  BM once daily without rectal bleeding.  Exercises by walking dogs.  Nausea all the time suspects secondary to medications.  Just had labs done-some were abnormal at Dr. Margaretmary Eddy [facility]).  Thinks dairy may make gas/bloat worse.  After passes flatus, feels better.  Trying probiotics OTC-trubiotic.  Saw GYN in [place], [place] last week-had PAP, GYN exam-pt reports all normal.  Past Medical History  Diagnosis Date  . Chronic nausea     normal GES   . Hemorrhoids 11/17/08    Colonoscopy Dr Karilyn Cota  . Chronic headaches     Dr Hermelinda Medicus [place] [facility]  . PTSD (post-traumatic stress disorder)   . DM (diabetes mellitus)   . Seizure   . Torticollis   . Elevated liver enzymes     hx of, normal 03/2010    Past Surgical History  Procedure Date  . Colonoscopy 11/17/08    ext hemorrhoids  . Gastro empy study 03/03/10    normal  . Esophagogastroduodenoscopy 04/05/10    schatzi ring,small hiatal hernia,normal stomach,erosions consistent with reflux esophagitis  . Cholecystectomy   . Neck surgery   . Hand surgery   . Breast lumpectomy     left  . Craniotomy     secondary TBI    Current Outpatient Prescriptions  Medication Sig Dispense Refill  . cholecalciferol (VITAMIN D) 1000 UNITS tablet Take 2,000 Units by mouth daily.        . clonazePAM (KLONOPIN) 0.5 MG tablet Take 0.5 mg by mouth 2 (two) times daily as needed.        . desvenlafaxine (PRISTIQ) 50 MG 24 hr tablet Take 50 mg by mouth daily.        . fentaNYL (ACTIQ) 1200 MCG lollipop Place 1,200  mcg inside cheek every 4 (four) hours as needed.        Marland Kitchen glimepiride (AMARYL) 4 MG tablet Take 4 mg by mouth daily before breakfast.       . MAGNESIUM GLYCINATE PLUS PO Take 165 mg by mouth daily.        . metaxalone (SKELAXIN) 800 MG tablet Take 800 mg by mouth 3 (three) times daily.        . metFORMIN (GLUCOPHAGE-XR) 500 MG 24 hr tablet Take 1,000 mg by mouth daily with breakfast. 2 tablet Bid       . methadone (DOLOPHINE) 10 MG tablet Take 10 mg by mouth every 8 (eight) hours.        Marland Kitchen PROTONIX 40 MG tablet TAKE (1) TABLET BY MOUTH ONCE DAILY.  31 each  0  . SUMAtriptan (IMITREX) 100 MG tablet Take 100 mg by mouth every 2 (two) hours as needed.        . topiramate (TOPAMAX) 100 MG tablet Take 100 mg by mouth 2 (two) times daily. One tablet in the am Two tablets at bedtime      . UNABLE TO FIND TruBiotics  1 tablet daly         Allergies  as of 05/31/2011 - Review Complete 05/31/2011  Allergen Reaction Noted  . Aspirin      Family History  Problem Relation Age of Onset  . Colon cancer Mother 20    History   Social History  . Marital Status: Married    Spouse Name: N/A    Number of Children: 3  . Years of Education: N/A   Occupational History  . disabled    Social History Main Topics  . Smoking status: Never Smoker   . Smokeless tobacco: Not on file  . Alcohol Use: No  . Drug Use: No  . Sexually Active: Not on file   Other Topics Concern  . Not on file   Social History Narrative  . No narrative on file    Review of Systems: Gen: Denies any fever, chills, sweats, anorexia, fatigue, weakness, malaise, weight loss, and sleep disorder CV: Denies chest pain, angina, palpitations, syncope, orthopnea, PND, peripheral edema, and claudication. Resp: Denies dyspnea at rest, dyspnea with exercise, cough, sputum, wheezing, coughing up blood, and pleurisy. GI: Denies vomiting blood, jaundice, and fecal incontinence.   Denies dysphagia or odynophagia. Derm: Denies rash,  itching, dry skin, hives, moles, warts, or unhealing ulcers.  Psych: Denies depression, anxiety, memory loss, suicidal ideation, hallucinations, paranoia, and confusion. Heme: Denies bruising, bleeding, and enlarged lymph nodes.  Physical Exam: BP 94/58  Pulse 55  Temp(Src) 98.1 F (36.7 C) (Temporal)  Ht 5\' 4"  (1.626 m)  Wt 155 lb 12.8 oz (70.67 kg)  BMI 26.74 kg/m2 General:   Alert,  Well-developed, well-nourished, pleasant and cooperative in NAD Head:  Normocephalic and atraumatic. Eyes:  Sclera clear, no icterus.   Conjunctiva pink. Mouth:  No deformity or lesions, dentition normal. Neck:  Supple; no masses or thyromegaly. Heart:  Regular rate and rhythm; no murmurs, clicks, rubs,  or gallops. Abdomen:  Soft, nontender and nondistended. No masses, hepatosplenomegaly or hernias noted. Normal bowel sounds, without guarding, and without rebound.   Msk:  Symmetrical without gross deformities. Normal posture. Pulses:  Normal pulses noted. Extremities:  Without clubbing or edema. Neurologic:  Alert and  oriented x4;  grossly normal neurologically. Skin:  Intact without significant lesions or rashes. Cervical Nodes:  No significant cervical adenopathy. Psych:  Alert and cooperative. Normal mood and affect.

## 2011-06-03 NOTE — Progress Notes (Signed)
Cc to PCP 

## 2011-06-11 ENCOUNTER — Telehealth: Payer: Self-pay | Admitting: Urgent Care

## 2011-06-11 NOTE — Telephone Encounter (Signed)
Please call pt & see if she went for labs? Send Do not neglect your health letter if unable to contact pt. Thanks

## 2011-06-12 NOTE — Telephone Encounter (Signed)
Tried to call pt- NA. Letter mailed to pt

## 2011-06-26 ENCOUNTER — Other Ambulatory Visit: Payer: Self-pay | Admitting: Urgent Care

## 2011-06-27 LAB — IGA: IgA: 136 mg/dL (ref 69–380)

## 2011-07-01 NOTE — Progress Notes (Signed)
Quick Note:  Please let patient know-no evidence of celiac disease. IO:NGEX,BMWUXL, MD  ______

## 2011-07-02 NOTE — Progress Notes (Signed)
Results Cc to PCP  

## 2011-07-17 ENCOUNTER — Encounter
Payer: Worker's Compensation | Attending: Physical Medicine & Rehabilitation | Admitting: Physical Medicine & Rehabilitation

## 2011-07-17 DIAGNOSIS — M531 Cervicobrachial syndrome: Secondary | ICD-10-CM

## 2011-07-17 DIAGNOSIS — F4310 Post-traumatic stress disorder, unspecified: Secondary | ICD-10-CM

## 2011-07-17 DIAGNOSIS — M129 Arthropathy, unspecified: Secondary | ICD-10-CM | POA: Insufficient documentation

## 2011-07-17 DIAGNOSIS — M5382 Other specified dorsopathies, cervical region: Secondary | ICD-10-CM

## 2011-07-17 DIAGNOSIS — F3289 Other specified depressive episodes: Secondary | ICD-10-CM | POA: Insufficient documentation

## 2011-07-17 DIAGNOSIS — M542 Cervicalgia: Secondary | ICD-10-CM | POA: Insufficient documentation

## 2011-07-17 DIAGNOSIS — G43019 Migraine without aura, intractable, without status migrainosus: Secondary | ICD-10-CM

## 2011-07-17 DIAGNOSIS — F329 Major depressive disorder, single episode, unspecified: Secondary | ICD-10-CM | POA: Insufficient documentation

## 2011-07-17 DIAGNOSIS — R51 Headache: Secondary | ICD-10-CM | POA: Insufficient documentation

## 2011-07-17 NOTE — Assessment & Plan Note (Signed)
HISTORY:  Monica [name] is back regarding her chronic headaches.  She has seen a psychiatrist in [place] for 1 visit, but has not had further followup. She down the consult and visit was very helpful.  The provider spent 2-3 hours with here starting from the very beginning.  She is learning a bit better to cope with her pain.  She feels that she may no lower limits a bit better.  She tried the Pristiq but it made her dizzy and fatigued and she had to stop it despite adjusting the dose or changing the timing of it.  She does manage somewhat with her medications, but is limited.  Sleep is poor to fair.  Pain is described as sharp, stabbing, burning, aching, and constant.  REVIEW OF SYSTEMS:  Notable for the above.  Full 12-point review is in the written health and history section of the chart.  SOCIAL HISTORY:  Unchanged.  PHYSICAL EXAMINATION:  VITAL SIGNS:  Blood pressure is 146/59, pulse 57, respiratory rate is 16, and she is satting 98% on room air. GENERAL:  The patient is pleasant and alert.  She is in very good spirits today.  No pubic signs are seen today.  Strength grossly 5/5. Normal sensory function.  Gait is within normal limits. HEART:  Regular. CHEST:  Clear. ABDOMEN:  Soft and nontender.  ASSESSMENT: 1. History of cervicalgia, facet arthropathy, and chronic headaches. 2. Posttraumatic stress disorder and depression.  PLAN: 1. I still believe at this point that the key to this situation is     managing her mood and psychosocial behaviors.  Also she needs to     work on better pain management skills and coping.  I am hopeful     that she can continue with her psychiatric counseling as another     avenue and conduit for pain relief and behavior management.  She     seems to be gaining a better perspective aware she needs to be as a     whole, but she still has ways to go and I like to see her and     coping skills improved. 2. I did discuss alternative treatments with her today  and she should     pursue these.  I would be happy to review perspective treatments.     Again I told her to stay away from any chiropractor to her neck. 3. We will see her back in 2 months.  She will pick up her     prescription next month.     Ranelle Oyster, M.D. Electronically Signed    ZTS/MedQ D:  07/17/2011 11:04:59  T:  07/17/2011 13:33:22  Job #:  [id number]

## 2011-09-11 ENCOUNTER — Encounter
Payer: Worker's Compensation | Attending: Physical Medicine & Rehabilitation | Admitting: Physical Medicine & Rehabilitation

## 2011-09-11 DIAGNOSIS — M531 Cervicobrachial syndrome: Secondary | ICD-10-CM

## 2011-09-11 DIAGNOSIS — M129 Arthropathy, unspecified: Secondary | ICD-10-CM | POA: Insufficient documentation

## 2011-09-11 DIAGNOSIS — F4310 Post-traumatic stress disorder, unspecified: Secondary | ICD-10-CM | POA: Insufficient documentation

## 2011-09-11 DIAGNOSIS — R51 Headache: Secondary | ICD-10-CM | POA: Insufficient documentation

## 2011-09-11 DIAGNOSIS — M542 Cervicalgia: Secondary | ICD-10-CM | POA: Insufficient documentation

## 2011-09-11 DIAGNOSIS — M5382 Other specified dorsopathies, cervical region: Secondary | ICD-10-CM

## 2011-09-11 DIAGNOSIS — G43019 Migraine without aura, intractable, without status migrainosus: Secondary | ICD-10-CM

## 2011-09-11 NOTE — Assessment & Plan Note (Signed)
Monica [name] is back regarding her chronic headaches.  She has only had one visit with the psychiatrist in [place] to this point.  She would like further visits or followup, but she has not got any feedback in this regard.  Pain remains about where it has been at 6-8/10.  Pain is sharp, stabbing, tingling, and aching.  Sleep is poor.  She has some pain more with bending and exertion.  REVIEW OF SYSTEMS:  Notable for the above.  Full 12-point review is in the written health and history section of the chart.  SOCIAL HISTORY:  Unchanged.  Husband is at home.  She states that he is supportive but is not always a great listener.  PHYSICAL EXAMINATION:  VITAL SIGNS:  Blood pressure is 140/66, pulse 51, respiratory rate is 16, and she is saturating 97% on room air. GENERAL:  The patient is pleasant, alert.  She is generally in good spirits. MUSCULOSKELETAL:  Strength is grossly 5/5.  Normal cranial nerve exam. Gait is stable. NEUROLOGIC:  She does become emotional at times when we discus certain psychosocial topics.  ASSESSMENT: 1. History of cervicalgia, facet arthropathy, and chronic headaches. 2. Posttraumatic stress syndrome.  PLAN: 1. Again, I think the most important avenue for Monica [name] is the     psychological route.  She needs regular counseling and feedback.     She needs to have better coping skills.  She needs if nothing else     a listening ear.  I encouraged her to seek out other friends and     family who could assist her in this regard.  I did refill her     methadone today at 10 mg, #180; and Actiq 1200 mcg 1 daily p.r.n.,     #30. 2. I will see her back in about 3 months.  She will come back and pick     up prescriptions in 1 month.  Nursing will see her in 2 months.     Ranelle Oyster, M.D. Electronically Signed    ZTS/MedQ D:  09/11/2011 10:40:56  T:  09/11/2011 11:07:55  Job #:  [id number]

## 2011-10-07 ENCOUNTER — Telehealth: Payer: Self-pay | Admitting: Physical Medicine & Rehabilitation

## 2011-10-07 ENCOUNTER — Other Ambulatory Visit: Payer: Self-pay | Admitting: *Deleted

## 2011-10-07 MED ORDER — FENTANYL CITRATE 1200 MCG BU LPOP: 1200.0000 ug | Freq: Every day | BUCCAL | Status: DC | PRN

## 2011-10-07 MED ORDER — TOPIRAMATE 100 MG PO TABS: 100.0000 mg | ORAL_TABLET | Freq: Two times a day (BID) | ORAL | Status: DC

## 2011-10-07 NOTE — Telephone Encounter (Signed)
Pt aware rx is ready to pick up  

## 2011-10-07 NOTE — Telephone Encounter (Signed)
Patient needs refill on Actiq.

## 2011-10-07 NOTE — Telephone Encounter (Signed)
Yes

## 2011-10-07 NOTE — Telephone Encounter (Signed)
Last seen on 09/11/11, last refill on Actiq 1200mg  1 daily PRN #30 was on 09/11/11. Okay to refill?

## 2011-10-18 ENCOUNTER — Other Ambulatory Visit: Payer: Self-pay | Admitting: *Deleted

## 2011-10-18 MED ORDER — METAXALONE 800 MG PO TABS: 800.0000 mg | ORAL_TABLET | Freq: Four times a day (QID) | ORAL | Status: DC | PRN

## 2011-10-25 ENCOUNTER — Other Ambulatory Visit: Payer: Self-pay | Admitting: Physical Medicine & Rehabilitation

## 2011-10-28 ENCOUNTER — Other Ambulatory Visit: Payer: Self-pay | Admitting: *Deleted

## 2011-11-05 ENCOUNTER — Other Ambulatory Visit: Payer: Self-pay | Admitting: Physical Medicine & Rehabilitation

## 2011-11-05 ENCOUNTER — Encounter: Payer: Self-pay | Admitting: *Deleted

## 2011-11-05 ENCOUNTER — Encounter: Payer: Worker's Compensation | Attending: Physical Medicine & Rehabilitation | Admitting: *Deleted

## 2011-11-05 VITALS — BP 131/75 | HR 56 | Resp 18 | Ht 64.0 in | Wt 151.0 lb

## 2011-11-05 DIAGNOSIS — F4310 Post-traumatic stress disorder, unspecified: Secondary | ICD-10-CM | POA: Insufficient documentation

## 2011-11-05 DIAGNOSIS — M531 Cervicobrachial syndrome: Secondary | ICD-10-CM

## 2011-11-05 DIAGNOSIS — G8929 Other chronic pain: Secondary | ICD-10-CM | POA: Insufficient documentation

## 2011-11-05 DIAGNOSIS — M129 Arthropathy, unspecified: Secondary | ICD-10-CM | POA: Insufficient documentation

## 2011-11-05 DIAGNOSIS — M542 Cervicalgia: Secondary | ICD-10-CM

## 2011-11-05 DIAGNOSIS — G43019 Migraine without aura, intractable, without status migrainosus: Secondary | ICD-10-CM

## 2011-11-05 DIAGNOSIS — R51 Headache: Secondary | ICD-10-CM | POA: Insufficient documentation

## 2011-11-05 MED ORDER — METHADONE HCL 10 MG PO TABS: 10.0000 mg | ORAL_TABLET | Freq: Three times a day (TID) | ORAL | Status: DC

## 2011-11-05 MED ORDER — FENTANYL CITRATE 1200 MCG BU LPOP: 1200.0000 ug | Freq: Every day | BUCCAL | Status: DC | PRN

## 2011-11-05 NOTE — Progress Notes (Signed)
Random UDS collected today. Verbalizes that her meds are not relieving her pain anymore, including her muscle relaxant. Discusses many activities and plans that she has, but cannot accomplish due to her headaches and neck stiffness. Becomes tearful; feels she needs to see a counselor but cannot get insurance to approve coverage for this. Attempted to provide emotional support and will relay concerns to Dr [name]; will try to schedule MD followup asap.

## 2011-11-05 NOTE — Progress Notes (Signed)
Addended by: Leonie Douglas A on: 11/05/2011 04:12 PM   Modules accepted: Orders

## 2011-11-06 ENCOUNTER — Encounter: Payer: Self-pay | Admitting: Physical Medicine & Rehabilitation

## 2011-11-07 ENCOUNTER — Other Ambulatory Visit: Payer: Self-pay | Admitting: Physical Medicine & Rehabilitation

## 2011-11-11 ENCOUNTER — Encounter: Payer: Self-pay | Admitting: Physical Medicine & Rehabilitation

## 2011-12-02 ENCOUNTER — Telehealth: Payer: Self-pay | Admitting: *Deleted

## 2011-12-02 MED ORDER — FENTANYL CITRATE 1200 MCG BU LPOP: 1200.0000 ug | Freq: Every day | BUCCAL | Status: DC | PRN

## 2011-12-02 MED ORDER — METHADONE HCL 10 MG PO TABS: 10.0000 mg | ORAL_TABLET | Freq: Three times a day (TID) | ORAL | Status: DC

## 2011-12-02 NOTE — Telephone Encounter (Signed)
Refill on Methadone and Actiq

## 2011-12-02 NOTE — Telephone Encounter (Signed)
Rx ready for pick up, pt aware 

## 2011-12-02 NOTE — Telephone Encounter (Signed)
Printed for Dr. Kirsteins to sign in Dr. Swartz absence. 

## 2011-12-24 ENCOUNTER — Ambulatory Visit: Payer: Self-pay | Admitting: Physical Medicine & Rehabilitation

## 2011-12-30 ENCOUNTER — Encounter: Payer: Self-pay | Admitting: Physical Medicine and Rehabilitation

## 2011-12-30 ENCOUNTER — Encounter
Payer: Worker's Compensation | Attending: Physical Medicine & Rehabilitation | Admitting: Physical Medicine and Rehabilitation

## 2011-12-30 VITALS — BP 120/72 | HR 68 | Resp 12 | Ht 64.0 in | Wt 153.0 lb

## 2011-12-30 DIAGNOSIS — F4310 Post-traumatic stress disorder, unspecified: Secondary | ICD-10-CM | POA: Insufficient documentation

## 2011-12-30 DIAGNOSIS — M542 Cervicalgia: Secondary | ICD-10-CM

## 2011-12-30 DIAGNOSIS — R519 Headache, unspecified: Secondary | ICD-10-CM

## 2011-12-30 DIAGNOSIS — M129 Arthropathy, unspecified: Secondary | ICD-10-CM | POA: Insufficient documentation

## 2011-12-30 DIAGNOSIS — R112 Nausea with vomiting, unspecified: Secondary | ICD-10-CM | POA: Insufficient documentation

## 2011-12-30 DIAGNOSIS — R51 Headache: Secondary | ICD-10-CM

## 2011-12-30 MED ORDER — FENTANYL CITRATE 1200 MCG BU LPOP: 1200.0000 ug | Freq: Every day | BUCCAL | Status: DC | PRN

## 2011-12-30 MED ORDER — METHADONE HCL 10 MG PO TABS: 10.0000 mg | ORAL_TABLET | Freq: Three times a day (TID) | ORAL | Status: DC

## 2011-12-30 NOTE — Progress Notes (Deleted)
  Subjective:    Patient ID: Monica [name], female    DOB: [birth year], 61 y.o.   MRN: [id number]  HPI Patient feels very depressed and lustless. Patient is very tearful and desperate. Pain in neck bilateral radiating into her head. Pain Inventory Average Pain 8 Pain Right Now 8 My pain is constant, sharp and burning  In the last 24 hours, has pain interfered with the following? General activity 2 Relation with others 2 Enjoyment of life 1 What TIME of day is your pain at its worst? all the time Sleep (in general) Poor  Pain is worse with: unsure Pain improves with: rest, heat/ice and medication Relief from Meds: 3  Mobility do you drive?  yes  Function retired I need assistance with the following:  meal prep and household duties  Neuro/Psych depression anxiety suicidal thoughts no plan pt is dealing with insurance and trying to seek other help.  Prior Studies Any changes since last visit?  no  Physicians involved in your care Any changes since last visit?  no       Review of Systems  Gastrointestinal: Positive for nausea and vomiting.  Psychiatric/Behavioral: Positive for dysphoric mood. The patient is nervous/anxious.   All other systems reviewed and are negative.  Monica [name] is back regarding her chronic headaches. She has only had one  visit with the psychiatrist in [place] to this point. She would like  further visits or followup, but she has not got any feedback in this  Regard. I would strongly recommend for her to see a psychiatrist or counselor, because of her severe depression.  Pain remains about where it has been at 6-8/10. Pain is sharp,  stabbing, tingling, and aching.  Sleep is poor. She has some pain more with bending and exertion. Patient reports that she was seen by a neurologist, who could not offer her any treatment.Went to PT, did not help at all.     Objective:   Physical Exam Symmetric normal motor tone is noted throughout. Normal muscle  bulk. Muscle testing reveals 5/5 muscle strength of the upper extremity, and 5/5 of the lower extremity. Full range of motion in upper and lower extremities. ROM of  Cervical spine is  Restricted, because of pain. Fine motor movements are normal in both hands. Sensory is intact and symmetric to light touch, pinprick and proprioception. DTR in the upper and lower extremity are present and symmetric 2+. No clonus is noted.  Patient arises from chair without difficulty. Narrow based gait with normal arm swing bilateral , able to walk on heels and toes . Tandem walk is stable. No pronator drift. Rhomberg negative. Tenderness in descending trapezius bilateral.         Assessment & Plan:  1. History of cervicalgia, facet arthropathy, and chronic headaches.  2. Posttraumatic stress syndrome.  PLAN:  1. Again, I think the most important avenue for Monica [name] is the  psychological route. She needs regular counseling and feedback.  She needs to have better coping skills. She needs if nothing else  a listening ear.  I did refill her  methadone today at 10 mg, #180; and Actiq 1200 mcg 1 daily p.r.n.,  #30.  2. I will see her back in about 1 months.  3. Advised patient to pay attention to her posture, which also aggrevates her symptoms, also showed her some tricks to keep a good posture to practise at home.

## 2011-12-30 NOTE — Patient Instructions (Signed)
Advised patient to continue applying heat to her neck, also she should pay attention to a good posture, showed her some correction. I strongly advised the patient to see her counselor, because of her severe depression.

## 2011-12-31 ENCOUNTER — Telehealth: Payer: Self-pay

## 2011-12-31 ENCOUNTER — Other Ambulatory Visit: Payer: Self-pay

## 2011-12-31 MED ORDER — FENTANYL CITRATE 1200 MCG BU LPOP: 1200.0000 ug | Freq: Every day | BUCCAL | Status: DC | PRN

## 2011-12-31 MED ORDER — METHADONE HCL 10 MG PO TABS: 10.0000 mg | ORAL_TABLET | Freq: Three times a day (TID) | ORAL | Status: DC

## 2011-12-31 NOTE — Telephone Encounter (Signed)
Spoke with pt she is doing better and has gotten her medication.

## 2012-01-11 ENCOUNTER — Other Ambulatory Visit: Payer: Self-pay | Admitting: Physical Medicine & Rehabilitation

## 2012-01-23 ENCOUNTER — Encounter
Payer: Worker's Compensation | Attending: Physical Medicine & Rehabilitation | Admitting: Physical Medicine and Rehabilitation

## 2012-01-23 ENCOUNTER — Encounter: Payer: Self-pay | Admitting: Physical Medicine and Rehabilitation

## 2012-01-23 VITALS — BP 139/50 | HR 52 | Resp 14 | Ht 64.0 in | Wt 153.0 lb

## 2012-01-23 DIAGNOSIS — R51 Headache: Secondary | ICD-10-CM

## 2012-01-23 DIAGNOSIS — G8929 Other chronic pain: Secondary | ICD-10-CM | POA: Insufficient documentation

## 2012-01-23 DIAGNOSIS — M542 Cervicalgia: Secondary | ICD-10-CM | POA: Insufficient documentation

## 2012-01-23 DIAGNOSIS — F4310 Post-traumatic stress disorder, unspecified: Secondary | ICD-10-CM | POA: Insufficient documentation

## 2012-01-23 DIAGNOSIS — M129 Arthropathy, unspecified: Secondary | ICD-10-CM | POA: Insufficient documentation

## 2012-01-23 MED ORDER — METHADONE HCL 10 MG PO TABS: 10.0000 mg | ORAL_TABLET | Freq: Three times a day (TID) | ORAL | Status: DC

## 2012-01-23 MED ORDER — FENTANYL CITRATE 1200 MCG BU LPOP: 1200.0000 ug | Freq: Every day | BUCCAL | Status: DC | PRN

## 2012-01-23 NOTE — Progress Notes (Deleted)
Subjective:    Patient ID: Monica [name], female    DOB: [birth year], 61 y.o.   MRN: [id number]  HPI The patient complains about chronic headaches which radiates into her neck. The problem has been stable improved gotten worse.Patient is in the office earlier, because her daughter is in labor, and she needs a prescription, before she leaves to visit her. The patient reports that she has been paying more attention to her posture and that has helped a little with her headaches.  Pain Inventory Average Pain 6 Pain Right Now 7 My pain is constant, sharp, burning, dull, stabbing, tingling and aching  In the last 24 hours, has pain interfered with the following? General activity 7 Relation with others 9 Enjoyment of life 10 What TIME of day is your pain at its worst? all day Sleep (in general) Fair  Pain is worse with: walking, bending and some activites Pain improves with: rest, heat/ice and medication Relief from Meds: 4  Mobility Do you have any goals in this area?  no  Function retired  Neuro/Psych No problems in this area  Prior Studies Any changes since last visit?  no  Physicians involved in your care Any changes since last visit?  no   Family History  Problem Relation Age of Onset  . Colon cancer Mother 2   History   Social History  . Marital Status: Married    Spouse Name: N/A    Number of Children: 3  . Years of Education: N/A   Occupational History  . disabled    Social History Main Topics  . Smoking status: Never Smoker   . Smokeless tobacco: None  . Alcohol Use: No  . Drug Use: No  . Sexually Active: None   Other Topics Concern  . None   Social History Narrative  . None   Past Surgical History  Procedure Date  . Colonoscopy 11/17/08    ext hemorrhoids  . Gastro empy study 03/03/10    normal  . Esophagogastroduodenoscopy 04/05/10    schatzi ring,small hiatal hernia,normal stomach,erosions consistent with reflux esophagitis  .  Cholecystectomy   . Neck surgery   . Hand surgery   . Breast lumpectomy     left  . Craniotomy     secondary TBI   Past Medical History  Diagnosis Date  . Chronic nausea     normal GES   . Hemorrhoids 11/17/08    Colonoscopy Dr Karilyn Cota  . Chronic headaches     Dr Hermel[facility] [place] [facility]  . PTSD (post-traumatic stress disorder)   . DM (diabetes mellitus)   . Seizure   . Torticollis   . Elevated liver enzymes     hx of, normal 03/2010  . Headache   . Migraine without aura, with intractable migraine, so stated, without mention of status migrainosus   . Unspecified musculoskeletal disorders and symptoms referable to neck     cervical/trapezius  . Occipital neuralgia   . Posttraumatic stress disorder   . Torticollis   . Cervicalgia    BP 139/50  Pulse 52  Resp 14  Ht 5\' 4"  (1.626 m)  Wt 153 lb (69.4 kg)  BMI 26.26 kg/m2  SpO2 98%     Review of Systems  All other systems reviewed and are negative.       Objective:   Physical Exam Symmetric normal motor tone is noted throughout. Normal muscle bulk. Muscle testing reveals 5/5 muscle strength of the upper extremity, and 5/5 of  the lower extremity. Full range of motion in upper and lower extremities. ROM of Cervical spine is Restricted, because of pain. Fine motor movements are normal in both hands.  Sensory is intact and symmetric to light touch, pinprick and proprioception.  DTR in the upper and lower extremity are present and symmetric 2+. No clonus is noted.  Patient arises from chair without difficulty. Narrow based gait with normal arm swing bilateral , able to walk on heels and toes . Tandem walk is stable. No pronator drift. Rhomberg negative.  Tenderness in descending trapezius bilateral.  Kyphotic posture in T spine      Assessment & Plan:  1. History of cervicalgia, facet arthropathy, and chronic headaches.  2. Posttraumatic stress syndrome.  PLAN:  1.  I gave her a prescription for her methadone and  Actiq early, because she is visiting her daughter, who is in labor for a couple of weeks, she can only fill them when they are due.Patient is in a very good mood today. 2. I will see her back in about 1 months.  3. Advised patient to pay attention to her posture, which also aggrevates her symptoms, also showed her some tricks to keep a good posture to practise at home.

## 2012-01-23 NOTE — Patient Instructions (Signed)
Continue with medication and posture training.

## 2012-01-25 ENCOUNTER — Other Ambulatory Visit: Payer: Self-pay | Admitting: Physical Medicine & Rehabilitation

## 2012-01-30 ENCOUNTER — Ambulatory Visit: Payer: Self-pay | Admitting: Physical Medicine and Rehabilitation

## 2012-01-30 NOTE — Progress Notes (Signed)
Subjective:    Patient ID: Monica [name], female    DOB: 1950-10-24, 61 y.o.   MRN: [id number]  Headache  Associated symptoms include nausea and vomiting.   Patient feels very depressed and lustless. Patient is very tearful and desperate. Pain in neck bilateral radiating into her head. Pain Inventory Average Pain 8 Pain Right Now 8 My pain is constant, sharp and burning  In the last 24 hours, has pain interfered with the following? General activity 2 Relation with others 2 Enjoyment of life 1 What TIME of day is your pain at its worst? all the time Sleep (in general) Poor  Pain is worse with: unsure Pain improves with: rest, heat/ice and medication Relief from Meds: 3  Mobility do you drive?  yes  Function retired I need assistance with the following:  meal prep and household duties  Neuro/Psych depression anxiety suicidal thoughts no plan pt is dealing with insurance and trying to seek other help.  Prior Studies Any changes since last visit?  no  Physicians involved in your care Any changes since last visit?  no       Review of Systems  Gastrointestinal: Positive for nausea and vomiting.  Neurological: Positive for headaches.  Psychiatric/Behavioral: Positive for dysphoric mood. The patient is nervous/anxious.   All other systems reviewed and are negative.  Monica [name] is back regarding her chronic headaches. She has only had one  visit with the psychiatrist in [place] to this point. She would like  further visits or followup, but she has not got any feedback in this  Regard. I would strongly recommend for her to see a psychiatrist or counselor, because of her severe depression.  Pain remains about where it has been at 6-8/10. Pain is sharp,  stabbing, tingling, and aching.  Sleep is poor. She has some pain more with bending and exertion. Patient reports that she was seen by a neurologist, who could not offer her any treatment.Went to PT, did not help at  all.     Objective:   Physical Exam Symmetric normal motor tone is noted throughout. Normal muscle bulk. Muscle testing reveals 5/5 muscle strength of the upper extremity, and 5/5 of the lower extremity. Full range of motion in upper and lower extremities. ROM of  Cervical spine is  Restricted, because of pain. Fine motor movements are normal in both hands. Sensory is intact and symmetric to light touch, pinprick and proprioception. DTR in the upper and lower extremity are present and symmetric 2+. No clonus is noted.  Patient arises from chair without difficulty. Narrow based gait with normal arm swing bilateral , able to walk on heels and toes . Tandem walk is stable. No pronator drift. Rhomberg negative. Tenderness in descending trapezius bilateral.         Assessment & Plan:  1. History of cervicalgia, facet arthropathy, and chronic headaches.  2. Posttraumatic stress syndrome.  PLAN:  1. Again, I think the most important avenue for Monica [name] is the  psychological route. She needs regular counseling and feedback.  She needs to have better coping skills. She needs if nothing else  a listening ear.  I did refill her  methadone today at 10 mg, #180; and Actiq 1200 mcg 1 daily p.r.n.,  #30.  2. I will see her back in about 1 months.  3. Advised patient to pay attention to her posture, which also aggrevates her symptoms, also showed her some tricks to keep a good posture to practise at home.

## 2012-02-10 ENCOUNTER — Other Ambulatory Visit: Payer: Self-pay | Admitting: Physical Medicine & Rehabilitation

## 2012-02-26 ENCOUNTER — Encounter: Payer: Self-pay | Admitting: Physical Medicine and Rehabilitation

## 2012-02-26 ENCOUNTER — Encounter
Payer: Worker's Compensation | Attending: Physical Medicine & Rehabilitation | Admitting: Physical Medicine and Rehabilitation

## 2012-02-26 DIAGNOSIS — M542 Cervicalgia: Secondary | ICD-10-CM | POA: Insufficient documentation

## 2012-02-26 DIAGNOSIS — G8929 Other chronic pain: Secondary | ICD-10-CM | POA: Insufficient documentation

## 2012-02-26 DIAGNOSIS — M129 Arthropathy, unspecified: Secondary | ICD-10-CM | POA: Insufficient documentation

## 2012-02-26 DIAGNOSIS — F4310 Post-traumatic stress disorder, unspecified: Secondary | ICD-10-CM | POA: Insufficient documentation

## 2012-02-26 DIAGNOSIS — R51 Headache: Secondary | ICD-10-CM | POA: Insufficient documentation

## 2012-02-26 MED ORDER — METHADONE HCL 10 MG PO TABS: 10.0000 mg | ORAL_TABLET | Freq: Three times a day (TID) | ORAL | Status: DC

## 2012-02-26 MED ORDER — FENTANYL CITRATE 1200 MCG BU LPOP: 1200.0000 ug | Freq: Every day | BUCCAL | Status: DC | PRN

## 2012-02-26 NOTE — Patient Instructions (Signed)
Continue with posture training, continue with walking program.

## 2012-02-26 NOTE — Progress Notes (Signed)
Subjective:    Patient ID: Monica [name], female    DOB: Dec 02, 1950, 61 y.o.   MRN: [id number]  HPI The patient complains about chronic headaches which radiates into her neck.  The problem has been stable improved gotten worse.Patient is in the office earlier, because her daughter is in labor, and she needs a prescription, before she leaves to visit her. The patient reports that she has been paying more attention to her posture and that has helped a little with her headaches.  Pain Inventory Average Pain 8 Pain Right Now 8 My pain is constant, sharp, burning, dull, stabbing, tingling and aching  In the last 24 hours, has pain interfered with the following? General activity 9 Relation with others 9 Enjoyment of life 9 What TIME of day is your pain at its worst? all the time Sleep (in general) Poor  Pain is worse with: walking, bending and some activites Pain improves with: rest, heat/ice and medication Relief from Meds: 3  Mobility do you drive?  yes  Function disabled: date disabled 10 yrs ago?  Neuro/Psych No problems in this area  Prior Studies Any changes since last visit?  no  Physicians involved in your care Any changes since last visit?  no   Family History  Problem Relation Age of Onset  . Colon cancer Mother 14   History   Social History  . Marital Status: Married    Spouse Name: N/A    Number of Children: 3  . Years of Education: N/A   Occupational History  . disabled    Social History Main Topics  . Smoking status: Never Smoker   . Smokeless tobacco: None  . Alcohol Use: No  . Drug Use: No  . Sexually Active: None   Other Topics Concern  . None   Social History Narrative  . None   Past Surgical History  Procedure Date  . Colonoscopy 11/17/08    ext hemorrhoids  . Gastro empy study 03/03/10    normal  . Esophagogastroduodenoscopy 04/05/10    schatzi ring,small hiatal hernia,normal stomach,erosions consistent with reflux esophagitis  .  Cholecystectomy   . Neck surgery   . Hand surgery   . Breast lumpectomy     left  . Craniotomy     secondary TBI   Past Medical History  Diagnosis Date  . Chronic nausea     normal GES   . Hemorrhoids 11/17/08    Colonoscopy Dr Karilyn Cota  . Chronic headaches     Dr [facility] [place] [facility]  . PTSD (post-traumatic stress disorder)   . DM (diabetes mellitus)   . Seizure   . Torticollis   . Elevated liver enzymes     hx of, normal 03/2010  . Headache   . Migraine without aura, with intractable migraine, so stated, without mention of status migrainosus   . Unspecified musculoskeletal disorders and symptoms referable to neck     cervical/trapezius  . Occipital neuralgia   . Posttraumatic stress disorder   . Torticollis   . Cervicalgia    There were no vitals taken for this visit.     Review of Systems  Musculoskeletal: Positive for myalgias and arthralgias.  Psychiatric/Behavioral: The patient is nervous/anxious.   All other systems reviewed and are negative.       Objective:   Physical Exam Symmetric normal motor tone is noted throughout. Normal muscle bulk. Muscle testing reveals 5/5 muscle strength of the upper extremity, and 5/5 of the lower extremity.  Full range of motion in upper and lower extremities. ROM of Cervical spine is Restricted, because of pain. Fine motor movements are normal in both hands.  Sensory is intact and symmetric to light touch, pinprick and proprioception.  DTR in the upper and lower extremity are present and symmetric 2+. No clonus is noted.  Patient arises from chair without difficulty. Narrow based gait with normal arm swing bilateral , able to walk on heels and toes . Tandem walk is stable. No pronator drift. Rhomberg negative.  Tenderness in descending trapezius bilateral.  Kyphotic posture in T spine        Assessment & Plan:  1. History of cervicalgia, facet arthropathy, and chronic headaches.  2. Posttraumatic stress syndrome.    PLAN:  1. I gave her a prescription for her methadone and Actiq .  2. I will see her back in about 1 months.  3. Advised patient to pay attention to her posture, which also aggrevates her symptoms, also showed her some tricks to keep a good posture to practise at home.

## 2012-03-03 NOTE — Progress Notes (Signed)
Subjective:    Patient ID: Monica [name], female    DOB: [birth year], 61 y.o.   MRN: [id number]  Headache    The patient complains about chronic headaches which radiates into her neck. The problem has been stable improved gotten worse.Patient is in the office earlier, because her daughter is in labor, and she needs a prescription, before she leaves to visit her. The patient reports that she has been paying more attention to her posture and that has helped a little with her headaches.  Pain Inventory Average Pain 6 Pain Right Now 7 My pain is constant, sharp, burning, dull, stabbing, tingling and aching  In the last 24 hours, has pain interfered with the following? General activity 7 Relation with others 9 Enjoyment of life 10 What TIME of day is your pain at its worst? all day Sleep (in general) Fair  Pain is worse with: walking, bending and some activites Pain improves with: rest, heat/ice and medication Relief from Meds: 4  Mobility Do you have any goals in this area?  no  Function retired  Neuro/Psych No problems in this area  Prior Studies Any changes since last visit?  no  Physicians involved in your care Any changes since last visit?  no   Family History  Problem Relation Age of Onset  . Colon cancer Mother 62   History   Social History  . Marital Status: Married    Spouse Name: N/A    Number of Children: 3  . Years of Education: N/A   Occupational History  . disabled    Social History Main Topics  . Smoking status: Never Smoker   . Smokeless tobacco: None  . Alcohol Use: No  . Drug Use: No  . Sexually Active: None   Other Topics Concern  . None   Social History Narrative  . None   Past Surgical History  Procedure Date  . Colonoscopy 11/17/08    ext hemorrhoids  . Gastro empy study 03/03/10    normal  . Esophagogastroduodenoscopy 04/05/10    schatzi ring,small hiatal hernia,normal stomach,erosions consistent with reflux esophagitis  .  Cholecystectomy   . Neck surgery   . Hand surgery   . Breast lumpectomy     left  . Craniotomy     secondary TBI   Past Medical History  Diagnosis Date  . Chronic nausea     normal GES   . Hemorrhoids 11/17/08    Colonoscopy Dr Karilyn Cota  . Chronic headaches     Dr [facility] [place] [facility]  . PTSD (post-traumatic stress disorder)   . DM (diabetes mellitus)   . Seizure   . Torticollis   . Elevated liver enzymes     hx of, normal 03/2010  . Headache   . Migraine without aura, with intractable migraine, so stated, without mention of status migrainosus   . Unspecified musculoskeletal disorders and symptoms referable to neck     cervical/trapezius  . Occipital neuralgia   . Posttraumatic stress disorder   . Torticollis   . Cervicalgia    BP 139/50  Pulse 52  Resp 14  Ht 5\' 4"  (1.626 m)  Wt 153 lb (69.4 kg)  BMI 26.26 kg/m2  SpO2 98%     Review of Systems  Neurological: Positive for headaches.  All other systems reviewed and are negative.       Objective:   Physical Exam Symmetric normal motor tone is noted throughout. Normal muscle bulk. Muscle testing reveals 5/5 muscle  strength of the upper extremity, and 5/5 of the lower extremity. Full range of motion in upper and lower extremities. ROM of Cervical spine is Restricted, because of pain. Fine motor movements are normal in both hands.  Sensory is intact and symmetric to light touch, pinprick and proprioception.  DTR in the upper and lower extremity are present and symmetric 2+. No clonus is noted.  Patient arises from chair without difficulty. Narrow based gait with normal arm swing bilateral , able to walk on heels and toes . Tandem walk is stable. No pronator drift. Rhomberg negative.  Tenderness in descending trapezius bilateral.  Kyphotic posture in T spine      Assessment & Plan:  1. History of cervicalgia, facet arthropathy, and chronic headaches.  2. Posttraumatic stress syndrome.  PLAN:  1.  I gave  her a prescription for her methadone and Actiq early, because she is visiting her daughter, who is in labor for a couple of weeks, she can only fill them when they are due.Patient is in a very good mood today. 2. I will see her back in about 1 months.  3. Advised patient to pay attention to her posture, which also aggrevates her symptoms, also showed her some tricks to keep a good posture to practise at home.

## 2012-03-11 ENCOUNTER — Other Ambulatory Visit: Payer: Self-pay | Admitting: Physical Medicine & Rehabilitation

## 2012-03-25 ENCOUNTER — Encounter: Payer: Self-pay | Admitting: Physical Medicine and Rehabilitation

## 2012-03-25 ENCOUNTER — Encounter
Payer: Worker's Compensation | Attending: Physical Medicine and Rehabilitation | Admitting: Physical Medicine and Rehabilitation

## 2012-03-25 VITALS — BP 127/69 | HR 63 | Resp 12 | Ht 64.0 in | Wt 150.0 lb

## 2012-03-25 DIAGNOSIS — M129 Arthropathy, unspecified: Secondary | ICD-10-CM | POA: Insufficient documentation

## 2012-03-25 DIAGNOSIS — R51 Headache: Secondary | ICD-10-CM | POA: Insufficient documentation

## 2012-03-25 DIAGNOSIS — M542 Cervicalgia: Secondary | ICD-10-CM | POA: Insufficient documentation

## 2012-03-25 DIAGNOSIS — F4310 Post-traumatic stress disorder, unspecified: Secondary | ICD-10-CM | POA: Insufficient documentation

## 2012-03-25 MED ORDER — METHADONE HCL 10 MG PO TABS
10.0000 mg | ORAL_TABLET | Freq: Three times a day (TID) | ORAL | Status: DC
Start: 2012-03-25 — End: 2012-04-22

## 2012-03-25 MED ORDER — FENTANYL CITRATE 1200 MCG BU LPOP: 1200.0000 ug | Freq: Every day | BUCCAL | Status: DC | PRN

## 2012-03-25 NOTE — Patient Instructions (Signed)
Continue with trying to keep a good posture.

## 2012-03-25 NOTE — Progress Notes (Unsigned)
Subjective:    Patient ID: Monica [name], female    DOB: 15-Jan-1951, 61 y.o.   MRN: [id number]  HPI The patient complains about chronic headaches which radiates into her neck.  The problem has been stable improved gotten worse.Patient is in the office earlier, because her daughter is in labor, and she needs a prescription, before she leaves to visit her. The patient reports that she has been paying more attention to her posture and that has helped a little with her headaches. Today the patient is very aggitated, and depressed because of family problems.  Pain Inventory Average Pain 9 Pain Right Now 8 My pain is constant, sharp, burning, stabbing, tingling and aching  In the last 24 hours, has pain interfered with the following? General activity 10 Relation with others 10 Enjoyment of life 10 What TIME of day is your pain at its worst? all of the time Sleep (in general) Poor  Pain is worse with: bending and some activites Pain improves with: rest and heat/ice Relief from Meds: 1  Mobility walk without assistance  Function not employed: date last employed 12 yrs ago  Neuro/Psych bowel control problems  Prior Studies Any changes since last visit?  no  Physicians involved in your care {[facility] PHYSICIANS INVOLVED IN YOUR CARE:[id number]}   Family History  Problem Relation Age of Onset  . Colon cancer Mother 31   History   Social History  . Marital Status: Married    Spouse Name: N/A    Number of Children: 3  . Years of Education: N/A   Occupational History  . disabled    Social History Main Topics  . Smoking status: Never Smoker   . Smokeless tobacco: Never Used  . Alcohol Use: No  . Drug Use: No  . Sexually Active: None   Other Topics Concern  . None   Social History Narrative  . None   Past Surgical History  Procedure Date  . Colonoscopy 11/17/08    ext hemorrhoids  . Gastro empy study 03/03/10    normal  . Esophagogastroduodenoscopy 04/05/10   schatzi ring,small hiatal hernia,normal stomach,erosions consistent with reflux esophagitis  . Cholecystectomy   . Neck surgery   . Hand surgery   . Breast lumpectomy     left  . Craniotomy     secondary TBI   Past Medical History  Diagnosis Date  . Chronic nausea     normal GES   . Hemorrhoids 11/17/08    Colonoscopy Dr Karilyn Cota  . Chronic headaches     Dr [facility]el[facility] [place] [facility]  . PTSD (post-traumatic stress disorder)   . DM (diabetes mellitus)   . Seizure   . Torticollis   . Elevated liver enzymes     hx of, normal 03/2010  . Headache   . Migraine without aura, with intractable migraine, so stated, without mention of status migrainosus   . Unspecified musculoskeletal disorders and symptoms referable to neck     cervical/trapezius  . Occipital neuralgia   . Posttraumatic stress disorder   . Torticollis   . Cervicalgia    BP 127/69  Pulse 63  Resp 12  Ht 5\' 4"  (1.626 m)  Wt 150 lb (68.04 kg)  BMI 25.75 kg/m2  SpO2 98%    Review of Systems  Neurological: Positive for headaches.  All other systems reviewed and are negative.       Objective:   Physical Exam Symmetric normal motor tone is noted throughout. Normal muscle bulk. Muscle  testing reveals 5/5 muscle strength of the upper extremity, and 5/5 of the lower extremity. Full range of motion in upper and lower extremities. ROM of Cervical spine is Restricted, because of pain. Fine motor movements are normal in both hands.  Sensory is intact and symmetric to light touch, pinprick and proprioception.  DTR in the upper and lower extremity are present and symmetric 2+. No clonus is noted.  Patient arises from chair without difficulty. Narrow based gait with normal arm swing bilateral , able to walk on heels and toes . Tandem walk is stable. No pronator drift. Rhomberg negative.  Tenderness in descending trapezius bilateral.  Kyphotic posture in T spine        Assessment & Plan:  1. History of cervicalgia,  facet arthropathy, and chronic headaches.  2. Posttraumatic stress syndrome.  PLAN:  1. I gave her a prescription for her methadone and Actiq .  2. I will see her back in about 1 months.  3. Advised patient to pay attention to her posture, which also aggrevates her symptoms, also showed her some tricks to keep a good posture to practise at home.  4. Patient is very agitated and depressed, she states, that everybody in her family is against her, and that they say she is ruining their lives. She said she would like to die , but she does not have the guts to do it, then she states, that she just wants to get into her car and end it. At that point I got the nurse into the room, and explained to her that if she says something like that we have to act upon it, for her safety. The patient then denied that she had said that she wants to get into her car and end it. She states, that she said she wanted to get into her car and just get home. She states, that she would release [place] from all responsibilities, and will not do herself harm. I then talked to the nurse and the manager, we decided to talk to the patient, that she should go over to the ED at [facility], the patient did not want to do that, then we stated that we can call a counselor from [facility] to come over and talk to her, and evaluate her. The patient then got very upset and agitated, she stated that she thought she can express her feeling in our office, and that she always thought, that out office is a safe place, but now she does not feel safe in our office anymore.She then left very upset and agitated.   During our conversation, we also discussed that she should see a psychiatrist, she had seen several in the past, which whom she was not happy, she stated that she saw one female psychiatrist , who really understand her, but she could not recall the name or location, she did not even recollect which town the physician was in. We tried to find out who that physician  was, but could not find it yet.

## 2012-04-07 ENCOUNTER — Other Ambulatory Visit: Payer: Self-pay

## 2012-04-07 MED ORDER — SUMATRIPTAN SUCCINATE 100 MG PO TABS: 100.0000 mg | ORAL_TABLET | Freq: Every day | ORAL | Status: DC | PRN

## 2012-04-07 MED ORDER — CLONAZEPAM 0.5 MG PO TABS: 0.5000 mg | ORAL_TABLET | Freq: Two times a day (BID) | ORAL | Status: DC | PRN

## 2012-04-22 ENCOUNTER — Encounter: Payer: Self-pay | Admitting: Physical Medicine & Rehabilitation

## 2012-04-22 ENCOUNTER — Encounter
Payer: Worker's Compensation | Attending: Physical Medicine & Rehabilitation | Admitting: Physical Medicine & Rehabilitation

## 2012-04-22 VITALS — BP 123/71 | HR 60 | Resp 14 | Ht 64.0 in | Wt 151.0 lb

## 2012-04-22 DIAGNOSIS — M47812 Spondylosis without myelopathy or radiculopathy, cervical region: Secondary | ICD-10-CM

## 2012-04-22 DIAGNOSIS — F329 Major depressive disorder, single episode, unspecified: Secondary | ICD-10-CM

## 2012-04-22 DIAGNOSIS — G43711 Chronic migraine without aura, intractable, with status migrainosus: Secondary | ICD-10-CM | POA: Insufficient documentation

## 2012-04-22 DIAGNOSIS — G43919 Migraine, unspecified, intractable, without status migrainosus: Secondary | ICD-10-CM

## 2012-04-22 DIAGNOSIS — M5380 Other specified dorsopathies, site unspecified: Secondary | ICD-10-CM | POA: Insufficient documentation

## 2012-04-22 DIAGNOSIS — F603 Borderline personality disorder: Secondary | ICD-10-CM | POA: Insufficient documentation

## 2012-04-22 HISTORY — DX: Spondylosis without myelopathy or radiculopathy, cervical region: M47.812

## 2012-04-22 MED ORDER — FENTANYL CITRATE 1200 MCG BU LPOP: 1200.0000 ug | Freq: Every day | BUCCAL | Status: DC | PRN

## 2012-04-22 MED ORDER — METHADONE HCL 10 MG PO TABS: 10.0000 mg | ORAL_TABLET | Freq: Three times a day (TID) | ORAL | Status: DC

## 2012-04-22 NOTE — Patient Instructions (Signed)
I WILL ATTEMPT TO FIND THE PSYCHOLOGIST YOU SAW IN [place] FOR FOLLOW UP ASSESSMENT AND CARE

## 2012-04-22 NOTE — Progress Notes (Signed)
Subjective:    Patient ID: Monica [name], female    DOB: 1951-04-01, 61 y.o.   MRN: [id number]  HPI  Monica [name] is back regarding her chronic headache and cervicalgia. Not much has changed regarding her pain. The last time she was here at our office, there apparently was quite an incident where Su Monks became alarmed when the patient voiced thoughts of harming herself.  The patient tells me today that she tried to recant, and her point was that the pain is bad at times that she wants to "end it all".  Monica [name] said that I wouldn't have reacted that way because I know her better. She tells me that she has too much to live for given her grandchildren, dogs, and improved relationship with her husband.  She remains on the methadone and actiq. She occasionally misses a dose of the methadone if she takes a mid day nap.  Pain Inventory Average Pain 8 Pain Right Now 8 My pain is constant, sharp, burning, dull, stabbing, tingling, aching and throbbing  In the last 24 hours, has pain interfered with the following? General activity 8 Relation with others 9 Enjoyment of life 9 What TIME of day is your pain at its worst? day and night Sleep (in general) Poor  Pain is worse with: walking, bending and some activites Pain improves with: rest, heat/ice, pacing activities and medication Relief from Meds: 3  Mobility walk without assistance ability to climb steps?  yes do you drive?  yes  Function retired  Neuro/Psych No problems in this area  Prior Studies Any changes since last visit?  no  Physicians involved in your care Any changes since last visit?  no   Family History  Problem Relation Age of Onset  . Colon cancer Mother 65   History   Social History  . Marital Status: Married    Spouse Name: N/A    Number of Children: 3  . Years of Education: N/A   Occupational History  . disabled    Social History Main Topics  . Smoking status: Never Smoker   . Smokeless tobacco:  Never Used  . Alcohol Use: No  . Drug Use: No  . Sexually Active: None   Other Topics Concern  . None   Social History Narrative  . None   Past Surgical History  Procedure Date  . Colonoscopy 11/17/08    ext hemorrhoids  . Gastro empy study 03/03/10    normal  . Esophagogastroduodenoscopy 04/05/10    schatzi ring,small hiatal hernia,normal stomach,erosions consistent with reflux esophagitis  . Cholecystectomy   . Neck surgery   . Hand surgery   . Breast lumpectomy     left  . Craniotomy     secondary TBI   Past Medical History  Diagnosis Date  . Chronic nausea     normal GES   . Hemorrhoids 11/17/08    Colonoscopy Dr Karilyn Cota  . Chronic headaches     Dr Hermelinda Medicus [place] [facility]  . PTSD (post-traumatic stress disorder)   . DM (diabetes mellitus)   . Seizure   . Torticollis   . Elevated liver enzymes     hx of, normal 03/2010  . Headache   . Migraine without aura, with intractable migraine, so stated, without mention of status migrainosus   . Unspecified musculoskeletal disorders and symptoms referable to neck     cervical/trapezius  . Occipital neuralgia   . Posttraumatic stress disorder   . Torticollis   . Cervicalgia  BP 123/71  Pulse 60  Resp 14  Ht 5\' 4"  (1.626 m)  Wt 151 lb (68.493 kg)  BMI 25.92 kg/m2  SpO2 99%     Review of Systems  Musculoskeletal: Positive for myalgias and arthralgias.  Neurological: Positive for headaches.  All other systems reviewed and are negative.       Objective:   Physical Exam  Physical Exam  Symmetric normal motor tone is noted throughout. Normal muscle bulk. Muscle testing reveals 5/5 muscle strength of the upper extremity, and 5/5 of the lower extremity. Full range of motion in upper and lower extremities. ROM of Cervical spine is Restricted, because of pain. Fine motor movements are normal in both hands.  Sensory is intact and symmetric to light touch, pinprick and proprioception.  DTR in the upper and lower  extremity are present and symmetric 2+. No clonus is noted.  Patient arises from chair without difficulty. Narrow based gait with normal arm swing bilateral , able to walk on heels and toes . Tandem walk is stable. No pronator drift. Rhomberg negative.  Tenderness in descending trapezius bilateral.  Kyphotic posture in T spine  Psych: improved, calm affect. Occasionally became a little tearful when we recounted the events of her last visit here. Assessment & Plan:   Assessment: 1. History of cervicalgia, facet arthropathy, and chronic daily headaches.  2. Posttraumatic stress syndrome. 3. Borderline Personality    PLAN:  1. I gave her a prescription for her methadone and Actiq. 2. I will see her back in about 1 month's time.  3. We reviewed her depression and its association with her pain. As I do know her well after all of these years, I do realize that she was trying to describe how severe her pain is at times. Nevertheless, Monica [name] has to realize that by using this type of language, and displaying the emotions that she did, that one can't help but to take her seriously or become concerned. I asked her to avoid using this type of language unless it was truly sincere. 4. Will attempt to refer the patient back to Dr. Marinus Maw (sp?) in [place], [place] for further counseling as the patient apparently had a nice bond with this practioner and would like to further explore treatment with her. 5. Return to our clinic for follow up in one month.

## 2012-05-04 ENCOUNTER — Ambulatory Visit (HOSPITAL_COMMUNITY)
Admission: RE | Admit: 2012-05-04 | Discharge: 2012-05-04 | Disposition: A | Discharge status: Home / Self Care | Payer: Medicare Other | Source: Ambulatory Visit | Attending: Urgent Care | Admitting: Urgent Care

## 2012-05-04 ENCOUNTER — Encounter: Payer: Self-pay | Admitting: Urgent Care

## 2012-05-04 ENCOUNTER — Telehealth: Payer: Self-pay | Admitting: Urgent Care

## 2012-05-04 ENCOUNTER — Ambulatory Visit (INDEPENDENT_AMBULATORY_CARE_PROVIDER_SITE_OTHER): Payer: Medicare Other | Admitting: Urgent Care

## 2012-05-04 VITALS — BP 124/72 | HR 61 | Temp 98.1°F | Ht 64.0 in | Wt 152.4 lb

## 2012-05-04 DIAGNOSIS — R1084 Generalized abdominal pain: Secondary | ICD-10-CM

## 2012-05-04 DIAGNOSIS — R109 Unspecified abdominal pain: Secondary | ICD-10-CM | POA: Insufficient documentation

## 2012-05-04 DIAGNOSIS — K7689 Other specified diseases of liver: Secondary | ICD-10-CM | POA: Insufficient documentation

## 2012-05-04 DIAGNOSIS — N200 Calculus of kidney: Secondary | ICD-10-CM | POA: Insufficient documentation

## 2012-05-04 DIAGNOSIS — R110 Nausea: Secondary | ICD-10-CM | POA: Insufficient documentation

## 2012-05-04 DIAGNOSIS — R195 Other fecal abnormalities: Secondary | ICD-10-CM

## 2012-05-04 DIAGNOSIS — K219 Gastro-esophageal reflux disease without esophagitis: Secondary | ICD-10-CM

## 2012-05-04 LAB — CBC WITH DIFFERENTIAL/PLATELET
Basophils Relative: 0 % (ref 0–1)
Eosinophils Absolute: 0.1 10*3/uL (ref 0.0–0.7)
Eosinophils Relative: 2 % (ref 0–5)
HCT: 40.5 % (ref 36.0–46.0)
Hemoglobin: 13.1 g/dL (ref 12.0–15.0)
Lymphs Abs: 1.1 10*3/uL (ref 0.7–4.0)
MCH: 31 pg (ref 26.0–34.0)
MCHC: 32.3 g/dL (ref 30.0–36.0)
MCV: 95.7 fL (ref 78.0–100.0)
Monocytes Absolute: 0.3 10*3/uL (ref 0.1–1.0)
Monocytes Relative: 4 % (ref 3–12)
RBC: 4.23 MIL/uL (ref 3.87–5.11)

## 2012-05-04 LAB — URINALYSIS W MICROSCOPIC + REFLEX CULTURE
Casts: NONE SEEN
Leukocytes, UA: NEGATIVE
Nitrite: NEGATIVE
pH: 5.5 (ref 5.0–8.0)

## 2012-05-04 LAB — COMPREHENSIVE METABOLIC PANEL
BUN: 13 mg/dL (ref 6–23)
CO2: 24 mEq/L (ref 19–32)
Creat: 1.09 mg/dL (ref 0.50–1.10)
Glucose, Bld: 208 mg/dL — ABNORMAL HIGH (ref 70–99)
Sodium: 137 mEq/L (ref 135–145)
Total Bilirubin: 0.2 mg/dL — ABNORMAL LOW (ref 0.3–1.2)
Total Protein: 7 g/dL (ref 6.0–8.3)

## 2012-05-04 LAB — LIPASE: Lipase: 29 U/L (ref 11–59)

## 2012-05-04 MED ORDER — ONDANSETRON HCL 4 MG PO TABS: 4.0000 mg | ORAL_TABLET | Freq: Four times a day (QID) | ORAL | Status: DC | PRN

## 2012-05-04 MED ORDER — IOHEXOL 300 MG/ML  SOLN
100.0000 mL | Freq: Once | INTRAMUSCULAR | Status: AC | PRN
  Administered 2012-05-04: 100 mL via INTRAVENOUS

## 2012-05-04 MED ORDER — PANTOPRAZOLE SODIUM 40 MG PO TBEC: 40.0000 mg | DELAYED_RELEASE_TABLET | Freq: Two times a day (BID) | ORAL | Status: DC

## 2012-05-04 NOTE — Telephone Encounter (Signed)
Referral has been sent over they are going to contact the patient with first available, they just asked [place] to make sure she has pain medication and something for nausea

## 2012-05-04 NOTE — Progress Notes (Signed)
Faxed to PCP

## 2012-05-04 NOTE — Telephone Encounter (Signed)
thanks

## 2012-05-04 NOTE — Assessment & Plan Note (Signed)
Severe post-prandial acute on chronic abdominal pain.  STAT CT to r/o acute pancreatitis or appendicitis.  Differentials include functional abdominal pain or UTI.  Stat CBC, CMP, UA & lipase To ER if severe pain Pt already has pain management

## 2012-05-04 NOTE — Progress Notes (Signed)
Quick Note:  Reviewed. See phone note. ZO:XWRU,EAVWUJ, MD  ______

## 2012-05-04 NOTE — Patient Instructions (Addendum)
To ER if severe pain Increase protonix to 40 mg before breakfast & dinner We will call you with your CT & lab results

## 2012-05-04 NOTE — Telephone Encounter (Signed)
Discussed CT results w/ pt.  UA shows blood-no bacteria.  No leukocytosis on CBC. Advised pt I felt it would be in her best interest to go directly to ER for IV fluids & pain control for renal lithiasis.  She declined. States she does not like ERs & would rather wait for urology appt.  Has methadone for pain. Advised to go directly to ER if uncontrolled pain ,fever, nausea or vomiting. Soledad Gerlach, please refer to [facility] & request ASAP appt for large left kidney stone Thanks

## 2012-05-04 NOTE — Assessment & Plan Note (Signed)
Increase protonix to 40mg  BID

## 2012-05-04 NOTE — Assessment & Plan Note (Signed)
Heme positive stool will need further evaluation with colonoscopy since last colonoscopy over 3 yrs ago to r/o colorectal ca.  I have discussed risks & benefits which include, but are not limited to, bleeding, infection, perforation & drug reaction.  The patient agrees with this plan & written consent will be obtained.  Will arrange pending CT & labs.

## 2012-05-04 NOTE — Progress Notes (Signed)
Quick Note:  See phone note Pt w/ urgent referral to urology ZO:XWRU,EAVWUJ, MD  ______

## 2012-05-04 NOTE — Telephone Encounter (Signed)
Ginger, please let pt know I sent zofran to [facility] for nausea. She has methadone for pain. Also let her know [facility] should be contacting her with appt time. Thanks

## 2012-05-04 NOTE — Progress Notes (Signed)
Referring Provider: Kirstie Peri, MD Primary Care Physician:  Kirstie Peri, MD Primary Gastroenterologist:  Dr. Jena Gauss  Chief Complaint  Patient presents with  . Hematochezia    heme positive stool    HPI:  Monica [name] is a 61 y.o. female here for hemoccult positive stool & abdominal pain.  When asked why she is here today, she becomes tearful & states "I feel horrible".  She tells me Dr Sherryll Burger found blood on stool cards.  She denies rectal bleeding or melena.  Last colonosopy 11/2008.  She is taking protonix 40mg  daily.  C/o breakthrough heartburn & indigestion.  Denies dysphagia or odynophagia. She has been going days without eating as she is afraid of abdominal pain.  C/o abdominal bloating & pain that is brought  On by meals.  She tells me her "whole stomach" hurts.  The pain lasts 2-3 hrs.  Pain is constant aching during that time, then resolves.  When she eats, she get severe nausea too, but denies vomiting.  She is having three BMs per week on average.  Denies constipation, straining or diarrhea.  She takes Methad[facility] for chronic headaches [facility]  She is taking voltaren too, but denies OTC aspirin products or OTC NSAIDs. Past Medical History  Diagnosis Date  . Chronic nausea     normal GES   . Hemorrhoids 11/17/08    Colonoscopy Dr Karilyn Cota  . Chronic headaches     Dr Hermelinda Medicus [facility]  . PTSD (post-traumatic stress disorder)   . DM (diabetes mellitus)   . Seizure   . Torticollis   . Elevated liver enzymes     hx of, normal 03/2010  . Headache   . Migraine without aura, with intractable migraine, so stated, without mention of status migrainosus   . Unspecified musculoskeletal disorders and symptoms referable to neck     cervical/trapezius  . Occipital neuralgia   . Posttraumatic stress disorder   . Torticollis   . Cervicalgia     Past Surgical History  Procedure Date  . Colonoscopy 11/17/08    ext hemorrhoids  . Gastro empy  study 03/03/10    normal  . Esophagogastroduodenoscopy 04/05/10    [name] (19F),erosive reflux esophagitis, small hiatal hernia,  . Cholecystectomy   . Neck surgery   . Hand surgery   . Breast lumpectomy     left  . Craniotomy     secondary TBI    Current Outpatient Prescriptions  Medication Sig Dispense Refill  . cholecalciferol (VITAMIN D) 1000 UNITS tablet Take 2,000 Units by mouth daily.        . diclofenac (VOLTAREN) 75 MG EC tablet Take 75 mg by mouth 2 (two) times daily.       . fentaNYL (ACTIQ) 1200 MCG lollipop Place 1,200 mcg inside cheek daily as needed.  30 tablet  0  . glimepiride (AMARYL) 4 MG tablet Take 4 mg by mouth daily before breakfast.       . IMITREX 100 MG tablet TAKE (1) TABLET BY MOUTH ONCE DAILY AS NEEDED.  30 each  0  . KLONOPIN 0.5 MG tablet TAKE (1) TABLET EVERY TWELVE HOURS.  60 each  0  . metFORMIN (GLUCOPHAGE-XR) 500 MG 24 hr tablet Take 1,000 mg by mouth 4 (four) times daily.       . methadone (DOLOPHINE) 10 MG tablet Take 1-2 tablets (10-20 mg total) by mouth 3 (three) times daily.  180 tablet  0  .  methylPREDNIsolone (MEDROL DOSPACK) 4 MG tablet       . pantoprazole (PROTONIX) 40 MG tablet Take 1 tablet (40 mg total) by mouth 2 (two) times daily.  60 tablet  0  . SKELAXIN 800 MG tablet TAKE (1) TABLET BY MOUTH FOUR TIMES DAILY AS NEEDED.  120 each  3  . SUMAtriptan (IMITREX) 100 MG tablet Take 1 tablet (100 mg total) by mouth daily as needed for migraine.  10 tablet  0  . TOPAMAX 100 MG tablet TAKE 1 TABLET IN THE MORNING AND 2 TABLETS AT BEDTIME.  90 each  3  . DISCONTD: pantoprazole (PROTONIX) 40 MG tablet Take 1 tablet (40 mg total) by mouth daily.  31 tablet  11    Allergies as of 05/04/2012 - Review Complete 05/04/2012  Allergen Reaction Noted  . Aspirin    . Milnacipran hcl  09/09/2011  . Pristiq (desvenlafaxine succinate monohydrate)  09/09/2011  . Zanaflex (tizanidine hydrochloride)  09/09/2011    Family History  Problem  Relation Age of Onset  . Colon cancer Mother 20    History   Social History  . Marital Status: Married    Spouse Name: N/A    Number of Children: 3  . Years of Education: N/A   Occupational History  . disabled    Social History Main Topics  . Smoking status: Never Smoker   . Smokeless tobacco: Never Used  . Alcohol Use: No  . Drug Use: No  . Sexually Active: Not on file   Other Topics Concern  . Not on file   Social History Narrative  . No narrative on file    Review of Systems: C/o low back pain, chronic HAs See HPI, otherwise negative ROS  Physical Exam: BP 124/72  Pulse 61  Temp 98.1 F (36.7 C) (Temporal)  Ht 5\' 4"  (1.626 m)  Wt 152 lb 6.4 oz (69.128 kg)  BMI 26.16 kg/m2 General:   Alert,  Well-developed, well-nourished, pleasant and cooperative, tearful & anxious Head:  Normocephalic and atraumatic. Eyes:  Sclera clear, no icterus.   Conjunctiva pink. Mouth:  No deformity or lesions, dentition normal. Neck:  Supple; no masses or thyromegaly. Heart:  Regular rate and rhythm; no murmurs, clicks, rubs,  or gallops. Abdomen:  +BS x4, soft, +TTP entire abdomen, especially at umbilicus.  +rebound & guarding. Msk:  Symmetrical without gross deformities. Pulses:  Normal pulses noted. Extremities:  Without clubbing or edema. Neurologic:  Alert and  oriented x4;  grossly normal neurologically. Skin:  Intact without significant lesions or rashes. Cervical Nodes:  No significant cervical adenopathy. Psych: Tearful, anxious.

## 2012-05-04 NOTE — Telephone Encounter (Signed)
Pt aware of zofran called into [facility]. She is also aware that [facility] will be contacting her with the appointment.

## 2012-05-06 ENCOUNTER — Other Ambulatory Visit: Payer: Self-pay | Admitting: Urology

## 2012-05-06 DIAGNOSIS — N200 Calculus of kidney: Secondary | ICD-10-CM

## 2012-05-07 ENCOUNTER — Other Ambulatory Visit: Payer: Self-pay | Admitting: Radiology

## 2012-05-07 ENCOUNTER — Encounter (HOSPITAL_COMMUNITY): Payer: Self-pay

## 2012-05-08 ENCOUNTER — Encounter (HOSPITAL_COMMUNITY): Payer: Self-pay

## 2012-05-08 ENCOUNTER — Ambulatory Visit (HOSPITAL_COMMUNITY)
Admission: RE | Admit: 2012-05-08 | Discharge: 2012-05-08 | Disposition: A | Discharge status: Home / Self Care | Payer: Medicare Other | Source: Ambulatory Visit | Attending: Urology | Admitting: Urology

## 2012-05-08 ENCOUNTER — Encounter (HOSPITAL_COMMUNITY)
Admission: RE | Admit: 2012-05-08 | Discharge: 2012-05-08 | Disposition: A | Discharge status: Home / Self Care | Payer: Medicare Other | Source: Ambulatory Visit | Attending: Urology | Admitting: Urology

## 2012-05-08 DIAGNOSIS — Z01810 Encounter for preprocedural cardiovascular examination: Secondary | ICD-10-CM | POA: Insufficient documentation

## 2012-05-08 DIAGNOSIS — I498 Other specified cardiac arrhythmias: Secondary | ICD-10-CM | POA: Insufficient documentation

## 2012-05-08 DIAGNOSIS — Z01812 Encounter for preprocedural laboratory examination: Secondary | ICD-10-CM | POA: Insufficient documentation

## 2012-05-08 DIAGNOSIS — R9431 Abnormal electrocardiogram [ECG] [EKG]: Secondary | ICD-10-CM | POA: Insufficient documentation

## 2012-05-08 DIAGNOSIS — Z01818 Encounter for other preprocedural examination: Secondary | ICD-10-CM | POA: Insufficient documentation

## 2012-05-08 HISTORY — DX: Adverse effect of unspecified anesthetic, initial encounter: T41.45XA

## 2012-05-08 HISTORY — DX: Other complications of anesthesia, initial encounter: T88.59XA

## 2012-05-08 LAB — BASIC METABOLIC PANEL
Chloride: 105 mEq/L (ref 96–112)
GFR calc Af Amer: 77 mL/min — ABNORMAL LOW (ref >90)
Potassium: 3.8 mEq/L (ref 3.5–5.1)

## 2012-05-08 LAB — SURGICAL PCR SCREEN: MRSA, PCR: NEGATIVE

## 2012-05-08 LAB — CBC
HCT: 36.8 % (ref 36.0–46.0)
Hemoglobin: 12 g/dL (ref 12.0–15.0)
RBC: 3.92 MIL/uL (ref 3.87–5.11)
WBC: 5.2 10*3/uL (ref 4.0–10.5)

## 2012-05-08 LAB — PROTIME-INR: INR: 1 (ref 0.00–1.49)

## 2012-05-08 LAB — APTT: aPTT: 30 seconds (ref 24–37)

## 2012-05-08 NOTE — Patient Instructions (Signed)
20      Your procedure is scheduled on:  Wednesday 05/13/2012 at 1 pm for surgery  Report to Radiology at 0800  AM.  Call this number if you have problems the morning of surgery: [phone]   Remember:   Do not eat food or drink liquids after midnight!  Take these medicines the morning of surgery with A SIP OF WATER: Protonix, Methadone, Klonopin, Topamax   Do not bring valuables to the hospital.  .  Leave suitcase in the car. After surgery it may be brought to your room.  For patients admitted to the hospital, checkout time is 11:00 AM the day of              Discharge.    Special Instructions: See [facility] Preparing  For Surgery Instruction Sheet.  Do not wear jewelry, lotions powders, perfumes. Women do not shave legs or underarms for 12 hours before showers. Contacts, partial plates, or dentures may not be worn into surgery.                          Patients discharged the day of surgery will not be allowed to drive home.  If going home the same day of surgery, must have someone stay with you first 24 hrs.at home and arrange for someone to drive you home from the              Hospital.   Please read over the following fact sheets that you were given: MRSA              INFORMATION, Sleep Apnea Sheet, Incentive Spirometry Sheet, Blood Transfusion Sheet               Telford Nab.Khalik Pewitt,RN,BSN [phone]

## 2012-05-12 NOTE — H&P (Signed)
H&P  Chief Complaint: large kidney stone  History of Present Illness: Monica [name] is a 61 y.o. year old who presents now for percutaneous management of a large left renal calculus.she was first seen in our office on 05/06/2012. At that time, she was sent by Dr. Benard Rink in [place] for evaluation and management of a large left renal stone. This was found on a CT scan which was performed for flank and abdominal pain, which was present for over a year. The pain was crampy and associated with nausea and vomiting. She has not had hematuria.  The patient does chronic headaches and is on methadone.    Past Medical History  Diagnosis Date  . Chronic nausea     normal GES   . Hemorrhoids 11/17/08    Colonoscopy Dr Karilyn Cota  . Chronic headaches     Dr Hermelinda Medicus [facility]  . PTSD (post-traumatic stress disorder)   . DM (diabetes mellitus)   . Seizure   . Torticollis   . Elevated liver enzymes     hx of, normal 03/2010  . Headache   . Migraine without aura, with intractable migraine, so stated, without mention of status migrainosus   . Unspecified musculoskeletal disorders and symptoms referable to neck     cervical/trapezius  . Occipital neuralgia   . Posttraumatic stress disorder   . Torticollis   . Cervicalgia   . Complication of anesthesia     had problem with local anesthesia-tried to assist Physician    Past Surgical History  Procedure Date  . Colonoscopy 11/17/08    ext hemorrhoids  . Gastro empy study 03/03/10    normal  . Esophagogastroduodenoscopy 04/05/10    [name] (76F),erosive reflux esophagitis, small hiatal hernia,  . Cholecystectomy   . Neck surgery 1998  . Hand surgery   . Breast lumpectomy     left  . Craniotomy     secondary TBI    Home Medications:  No prescriptions prior to admission    Allergies:  Allergies  Allergen Reactions  . Aspirin Itching    REACTION: Hives, slurred speech, blurred vision  . Milnacipran Hcl Other (See  Comments)    dizziness  . Pristiq (Desvenlafaxine Succinate Monohydrate) Other (See Comments)    dizziness  . Zanaflex (Tizanidine Hydrochloride) Other (See Comments)    dizzy    Family History  Problem Relation Age of Onset  . Colon cancer Mother 65    Social History:  reports that she has never smoked. She has never used smokeless tobacco. She reports that she does not drink alcohol or use illicit drugs.  ROS: Genitourinary, constitutional, skin, eye, otolaryngeal, hematologic/lymphatic, cardiovascular, pulmonary, endocrine, musculoskeletal, gastrointestinal, neurological and psychiatric system(s) were reviewed and pertinent findings if present are noted.  Gastrointestinal: nausea, vomiting, flank pain and abdominal pain.  Constitutional: night sweats and feeling tired (fatigue).  Integumentary: skin rash/lesion and pruritus.  Eyes: blurred vision and diplopia.  Hematologic/Lymphatic: a tendency to easily bruise.  Respiratory: shortness of breath.  Musculoskeletal: back pain and joint pain.  Neurological: dizziness and headache.  Psychiatric: depression and anxiety. .  Physical Exam:  Vital signs in last 24 hours:   Constitutional: Well nourished and well developed . No acute distress.  ENT:. The ears and nose are normal in appearance.  Neck: The appearance of the neck is normal and no neck mass is present.  Pulmonary: No respiratory distress and normal respiratory rhythm and effort.  Cardiovascular: Heart rate and rhythm are normal .  No peripheral edema.  Abdomen: The abdomen is flat. No masses are palpated. Mild tenderness in the LUQ is present and tenderness in the LLQ is present. Left CVA tenderness. No hernias are palpable. No hepatosplenomegaly noted.  Lymphatics: The femoral and inguinal nodes are not enlarged or tender.  Skin: Normal skin turgor, no visible rash and no visible skin lesions.  Neuro/Psych:. Mood and affect are appropriate.   Laboratory Data:  No  results found for this or any previous visit (from the past 24 hour(s)). Recent Results (from the past 240 hour(s))  SURGICAL PCR SCREEN     Status: Normal   Collection Time   05/08/12  2:15 PM      Component Value Range Status Comment   MRSA, PCR NEGATIVE  NEGATIVE Final    Staphylococcus aureus NEGATIVE  NEGATIVE Final    Creatinine:  Basename 05/08/12 1500  CREATININE 0.92    Radiologic Imaging: No results found.  Impression/Assessment:  25 mm, symptomatic left renal pelvic stone  Plan:  1. Percutaneous access will be obtained by interventional radiology  2. Following that, I will perform percutaneousnephrolithotomy. Risks and complications have been discussed with the patient. She understands and desires to proceed.  Chelsea Aus 05/12/2012, 1:45 PM  Bertram Millard. Clemmie Buelna MD

## 2012-05-13 ENCOUNTER — Encounter (HOSPITAL_COMMUNITY): Payer: Self-pay

## 2012-05-13 ENCOUNTER — Ambulatory Visit (HOSPITAL_COMMUNITY)
Admission: RE | Admit: 2012-05-13 | Discharge: 2012-05-13 | Disposition: A | Discharge status: Home / Self Care | Payer: Medicare Other | Source: Ambulatory Visit | Attending: Urology | Admitting: Urology

## 2012-05-13 ENCOUNTER — Encounter (HOSPITAL_COMMUNITY): Payer: Self-pay | Admitting: *Deleted

## 2012-05-13 ENCOUNTER — Encounter (HOSPITAL_COMMUNITY)
Admission: RE | Disposition: A | Discharge status: Home / Self Care | Payer: Self-pay | Source: Ambulatory Visit | Attending: Urology

## 2012-05-13 ENCOUNTER — Encounter (HOSPITAL_COMMUNITY): Payer: Self-pay | Admitting: Anesthesiology

## 2012-05-13 ENCOUNTER — Ambulatory Visit (HOSPITAL_COMMUNITY): Payer: Medicare Other

## 2012-05-13 ENCOUNTER — Inpatient Hospital Stay (HOSPITAL_COMMUNITY)
Admission: RE | Admit: 2012-05-13 | Discharge: 2012-05-15 | DRG: 660 | Disposition: A | Discharge status: Home / Self Care | Payer: Medicare Other | Source: Ambulatory Visit | Attending: Urology | Admitting: Urology

## 2012-05-13 ENCOUNTER — Ambulatory Visit (HOSPITAL_COMMUNITY): Payer: Medicare Other | Admitting: Anesthesiology

## 2012-05-13 DIAGNOSIS — Z888 Allergy status to other drugs, medicaments and biological substances status: Secondary | ICD-10-CM

## 2012-05-13 DIAGNOSIS — G8929 Other chronic pain: Secondary | ICD-10-CM | POA: Diagnosis present

## 2012-05-13 DIAGNOSIS — K219 Gastro-esophageal reflux disease without esophagitis: Secondary | ICD-10-CM | POA: Diagnosis present

## 2012-05-13 DIAGNOSIS — N200 Calculus of kidney: Secondary | ICD-10-CM

## 2012-05-13 DIAGNOSIS — F4310 Post-traumatic stress disorder, unspecified: Secondary | ICD-10-CM | POA: Diagnosis present

## 2012-05-13 DIAGNOSIS — G43919 Migraine, unspecified, intractable, without status migrainosus: Secondary | ICD-10-CM

## 2012-05-13 DIAGNOSIS — Z79899 Other long term (current) drug therapy: Secondary | ICD-10-CM

## 2012-05-13 DIAGNOSIS — Z9089 Acquired absence of other organs: Secondary | ICD-10-CM

## 2012-05-13 DIAGNOSIS — Z886 Allergy status to analgesic agent status: Secondary | ICD-10-CM

## 2012-05-13 DIAGNOSIS — N135 Crossing vessel and stricture of ureter without hydronephrosis: Secondary | ICD-10-CM | POA: Diagnosis not present

## 2012-05-13 DIAGNOSIS — E119 Type 2 diabetes mellitus without complications: Secondary | ICD-10-CM | POA: Diagnosis present

## 2012-05-13 DIAGNOSIS — M47812 Spondylosis without myelopathy or radiculopathy, cervical region: Secondary | ICD-10-CM

## 2012-05-13 HISTORY — PX: NEPHROLITHOTOMY: SHX5134

## 2012-05-13 LAB — GLUCOSE, CAPILLARY
Glucose-Capillary: 78 mg/dL (ref 70–99)
Glucose-Capillary: 87 mg/dL (ref 70–99)

## 2012-05-13 LAB — TYPE AND SCREEN: ABO/RH(D): O POS

## 2012-05-13 SURGERY — NEPHROLITHOTOMY PERCUTANEOUS
Anesthesia: General | Laterality: Left | Wound class: Clean Contaminated

## 2012-05-13 MED ORDER — LACTATED RINGERS IV SOLN
INTRAVENOUS | Status: DC
  Administered 2012-05-13: 1000 mL via INTRAVENOUS

## 2012-05-13 MED ORDER — STERILE WATER FOR IRRIGATION IR SOLN
Status: DC | PRN
  Administered 2012-05-13: 500 mL

## 2012-05-13 MED ORDER — MIDAZOLAM HCL 5 MG/5ML IJ SOLN
INTRAMUSCULAR | Status: DC | PRN
  Administered 2012-05-13: 0.5 mg via INTRAVENOUS

## 2012-05-13 MED ORDER — CIPROFLOXACIN IN D5W 400 MG/200ML IV SOLN
INTRAVENOUS | Status: AC
  Filled 2012-05-13: qty 200

## 2012-05-13 MED ORDER — FENTANYL CITRATE 0.05 MG/ML IJ SOLN
INTRAMUSCULAR | Status: AC
  Filled 2012-05-13: qty 8

## 2012-05-13 MED ORDER — METFORMIN HCL ER 500 MG PO TB24
1000.0000 mg | ORAL_TABLET | Freq: Two times a day (BID) | ORAL | Status: DC
  Administered 2012-05-14 – 2012-05-15 (×4): 1000 mg via ORAL
  Filled 2012-05-13 (×6): qty 2

## 2012-05-13 MED ORDER — FENTANYL CITRATE 0.05 MG/ML IJ SOLN
INTRAMUSCULAR | Status: AC | PRN
  Administered 2012-05-13 (×4): 100 ug via INTRAVENOUS

## 2012-05-13 MED ORDER — SODIUM CHLORIDE 0.9 % IR SOLN
Status: DC | PRN
  Administered 2012-05-13: 6000 mL

## 2012-05-13 MED ORDER — METHADONE HCL 10 MG PO TABS
20.0000 mg | ORAL_TABLET | Freq: Three times a day (TID) | ORAL | Status: DC
  Administered 2012-05-13 – 2012-05-15 (×6): 20 mg via ORAL
  Filled 2012-05-13 (×6): qty 2

## 2012-05-13 MED ORDER — SUCCINYLCHOLINE CHLORIDE 20 MG/ML IJ SOLN
INTRAMUSCULAR | Status: DC | PRN
  Administered 2012-05-13: 100 mg via INTRAVENOUS

## 2012-05-13 MED ORDER — LIDOCAINE HCL (CARDIAC) 20 MG/ML IV SOLN
INTRAVENOUS | Status: DC | PRN
  Administered 2012-05-13: 70 mg via INTRAVENOUS

## 2012-05-13 MED ORDER — GLYCOPYRROLATE 0.2 MG/ML IJ SOLN
INTRAMUSCULAR | Status: DC | PRN
  Administered 2012-05-13: 0.4 mg via INTRAVENOUS

## 2012-05-13 MED ORDER — FENTANYL CITRATE 600 MCG BU LPOP
1200.0000 ug | Freq: Every day | BUCCAL | Status: DC | PRN
  Filled 2012-05-13 (×2): qty 2

## 2012-05-13 MED ORDER — HYDROMORPHONE 0.3 MG/ML IV SOLN
INTRAVENOUS | Status: AC
  Filled 2012-05-13: qty 25

## 2012-05-13 MED ORDER — METAXALONE 800 MG PO TABS
800.0000 mg | ORAL_TABLET | Freq: Three times a day (TID) | ORAL | Status: DC
  Administered 2012-05-13 – 2012-05-15 (×6): 800 mg via ORAL
  Filled 2012-05-13 (×8): qty 1

## 2012-05-13 MED ORDER — IOHEXOL 300 MG/ML  SOLN
INTRAMUSCULAR | Status: DC | PRN
  Administered 2012-05-13: 10 mL

## 2012-05-13 MED ORDER — TOPIRAMATE 100 MG PO TABS
200.0000 mg | ORAL_TABLET | Freq: Every day | ORAL | Status: DC
  Administered 2012-05-13 – 2012-05-14 (×2): 200 mg via ORAL
  Filled 2012-05-13 (×3): qty 2

## 2012-05-13 MED ORDER — FENTANYL CITRATE 1200 MCG BU LPOP: 1200.0000 ug | Freq: Every day | BUCCAL | Status: DC | PRN

## 2012-05-13 MED ORDER — ACETAMINOPHEN 10 MG/ML IV SOLN: 1000.0000 mg | Freq: Four times a day (QID) | INTRAVENOUS | Status: DC

## 2012-05-13 MED ORDER — LIDOCAINE HCL (CARDIAC) 20 MG/ML IV SOLN: INTRAVENOUS | Status: DC | PRN

## 2012-05-13 MED ORDER — NALOXONE HCL 0.4 MG/ML IJ SOLN: 0.4000 mg | INTRAMUSCULAR | Status: DC | PRN

## 2012-05-13 MED ORDER — ONDANSETRON HCL 4 MG/2ML IJ SOLN
INTRAMUSCULAR | Status: DC | PRN
  Administered 2012-05-13: 2 mg via INTRAVENOUS

## 2012-05-13 MED ORDER — TOPIRAMATE 100 MG PO TABS
100.0000 mg | ORAL_TABLET | Freq: Every day | ORAL | Status: DC
  Administered 2012-05-14 – 2012-05-15 (×2): 100 mg via ORAL
  Filled 2012-05-13 (×2): qty 1

## 2012-05-13 MED ORDER — CEPHALEXIN 500 MG PO CAPS: 500.0000 mg | ORAL_CAPSULE | Freq: Two times a day (BID) | ORAL | Status: DC

## 2012-05-13 MED ORDER — HYDROMORPHONE 0.3 MG/ML IV SOLN
INTRAVENOUS | Status: DC
  Administered 2012-05-13: 16:00:00 via INTRAVENOUS
  Administered 2012-05-14: 2.19 mg via INTRAVENOUS
  Administered 2012-05-14: 2.39 mg via INTRAVENOUS
  Administered 2012-05-14: 0.999 mg via INTRAVENOUS
  Administered 2012-05-14: 09:00:00 via INTRAVENOUS
  Administered 2012-05-14: 1.59 mg via INTRAVENOUS
  Administered 2012-05-14: 1.19 mg via INTRAVENOUS
  Administered 2012-05-14: 2.99 mg via INTRAVENOUS
  Administered 2012-05-15: 0.599 mg via INTRAVENOUS
  Administered 2012-05-15: via INTRAVENOUS
  Administered 2012-05-15: 2.39 mg via INTRAVENOUS
  Filled 2012-05-13 (×2): qty 25

## 2012-05-13 MED ORDER — PROPOFOL 10 MG/ML IV EMUL
INTRAVENOUS | Status: DC | PRN
  Administered 2012-05-13: 130 mg via INTRAVENOUS

## 2012-05-13 MED ORDER — ACETAMINOPHEN 10 MG/ML IV SOLN
1000.0000 mg | Freq: Four times a day (QID) | INTRAVENOUS | Status: DC
  Administered 2012-05-13: 1000 mg via INTRAVENOUS

## 2012-05-13 MED ORDER — MIDAZOLAM HCL 2 MG/2ML IJ SOLN
INTRAMUSCULAR | Status: AC
  Filled 2012-05-13: qty 8

## 2012-05-13 MED ORDER — PROMETHAZINE HCL 25 MG/ML IJ SOLN: 6.2500 mg | INTRAMUSCULAR | Status: DC | PRN

## 2012-05-13 MED ORDER — DIPHENHYDRAMINE HCL 12.5 MG/5ML PO ELIX: 12.5000 mg | ORAL_SOLUTION | Freq: Four times a day (QID) | ORAL | Status: DC | PRN

## 2012-05-13 MED ORDER — 0.9 % SODIUM CHLORIDE (POUR BTL) OPTIME
TOPICAL | Status: DC | PRN
  Administered 2012-05-13: 1000 mL

## 2012-05-13 MED ORDER — FENTANYL CITRATE 0.05 MG/ML IJ SOLN
INTRAMUSCULAR | Status: DC | PRN
  Administered 2012-05-13 (×5): 50 ug via INTRAVENOUS

## 2012-05-13 MED ORDER — IOHEXOL 300 MG/ML  SOLN
INTRAMUSCULAR | Status: AC
  Filled 2012-05-13: qty 2

## 2012-05-13 MED ORDER — GLIMEPIRIDE 4 MG PO TABS
4.0000 mg | ORAL_TABLET | Freq: Every day | ORAL | Status: DC
  Administered 2012-05-14 – 2012-05-15 (×2): 4 mg via ORAL
  Filled 2012-05-13 (×4): qty 1

## 2012-05-13 MED ORDER — IOHEXOL 300 MG/ML  SOLN: 80.0000 mL | Freq: Once | INTRAMUSCULAR | Status: DC | PRN

## 2012-05-13 MED ORDER — CLONAZEPAM 0.5 MG PO TABS: 0.5000 mg | ORAL_TABLET | Freq: Three times a day (TID) | ORAL | Status: DC | PRN

## 2012-05-13 MED ORDER — DOCUSATE SODIUM 100 MG PO CAPS
100.0000 mg | ORAL_CAPSULE | Freq: Two times a day (BID) | ORAL | Status: DC
  Administered 2012-05-13 – 2012-05-15 (×4): 100 mg via ORAL
  Filled 2012-05-13 (×5): qty 1

## 2012-05-13 MED ORDER — OXYBUTYNIN CHLORIDE 5 MG PO TABS
5.0000 mg | ORAL_TABLET | Freq: Three times a day (TID) | ORAL | Status: DC | PRN
  Filled 2012-05-13: qty 1

## 2012-05-13 MED ORDER — HYDROMORPHONE HCL PF 2 MG/ML IJ SOLN
INTRAMUSCULAR | Status: AC
  Filled 2012-05-13: qty 1

## 2012-05-13 MED ORDER — VITAMIN D3 25 MCG (1000 UNIT) PO TABS
1000.0000 [IU] | ORAL_TABLET | Freq: Every morning | ORAL | Status: DC
  Administered 2012-05-14 – 2012-05-15 (×2): 1000 [IU] via ORAL
  Filled 2012-05-13 (×2): qty 1

## 2012-05-13 MED ORDER — MIDAZOLAM HCL 5 MG/5ML IJ SOLN
INTRAMUSCULAR | Status: AC | PRN
  Administered 2012-05-13 (×3): 2 mg via INTRAVENOUS

## 2012-05-13 MED ORDER — HYDROMORPHONE HCL PF 1 MG/ML IJ SOLN: 0.2500 mg | INTRAMUSCULAR | Status: DC | PRN

## 2012-05-13 MED ORDER — LACTATED RINGERS IV SOLN
INTRAVENOUS | Status: DC | PRN
  Administered 2012-05-13 (×2): via INTRAVENOUS

## 2012-05-13 MED ORDER — CEFAZOLIN SODIUM 1-5 GM-% IV SOLN
1.0000 g | Freq: Three times a day (TID) | INTRAVENOUS | Status: DC
  Administered 2012-05-13 – 2012-05-15 (×6): 1 g via INTRAVENOUS
  Filled 2012-05-13 (×11): qty 50

## 2012-05-13 MED ORDER — PANTOPRAZOLE SODIUM 40 MG PO TBEC
40.0000 mg | DELAYED_RELEASE_TABLET | Freq: Two times a day (BID) | ORAL | Status: DC
  Administered 2012-05-13 – 2012-05-15 (×4): 40 mg via ORAL
  Filled 2012-05-13 (×5): qty 1

## 2012-05-13 MED ORDER — NEOSTIGMINE METHYLSULFATE 1 MG/ML IJ SOLN
INTRAMUSCULAR | Status: DC | PRN
  Administered 2012-05-13: 3 mg via INTRAVENOUS

## 2012-05-13 MED ORDER — DIPHENHYDRAMINE HCL 50 MG/ML IJ SOLN: 12.5000 mg | Freq: Four times a day (QID) | INTRAMUSCULAR | Status: DC | PRN

## 2012-05-13 MED ORDER — CEFAZOLIN SODIUM-DEXTROSE 2-3 GM-% IV SOLR: 2.0000 g | INTRAVENOUS | Status: DC

## 2012-05-13 MED ORDER — SUMATRIPTAN SUCCINATE 100 MG PO TABS
100.0000 mg | ORAL_TABLET | ORAL | Status: DC | PRN
  Administered 2012-05-14 (×2): 100 mg via ORAL
  Filled 2012-05-13 (×3): qty 1

## 2012-05-13 MED ORDER — SODIUM CHLORIDE 0.9 % IV SOLN: Freq: Once | INTRAVENOUS | Status: DC

## 2012-05-13 MED ORDER — ONDANSETRON HCL 4 MG/2ML IJ SOLN: 4.0000 mg | Freq: Four times a day (QID) | INTRAMUSCULAR | Status: DC | PRN

## 2012-05-13 MED ORDER — HYDROMORPHONE HCL PF 1 MG/ML IJ SOLN
INTRAMUSCULAR | Status: AC | PRN
  Administered 2012-05-13 (×2): 1 mg via INTRAVENOUS

## 2012-05-13 MED ORDER — SODIUM CHLORIDE 0.9 % IJ SOLN: 9.0000 mL | INTRAMUSCULAR | Status: DC | PRN

## 2012-05-13 MED ORDER — CIPROFLOXACIN IN D5W 400 MG/200ML IV SOLN
400.0000 mg | Freq: Once | INTRAVENOUS | Status: AC
  Administered 2012-05-13: 400 mg via INTRAVENOUS

## 2012-05-13 MED ORDER — CISATRACURIUM BESYLATE (PF) 10 MG/5ML IV SOLN
INTRAVENOUS | Status: DC | PRN
  Administered 2012-05-13: 8 mg via INTRAVENOUS

## 2012-05-13 MED ORDER — SUCCINYLCHOLINE CHLORIDE 20 MG/ML IJ SOLN: INTRAMUSCULAR | Status: DC | PRN

## 2012-05-13 MED ORDER — ACETAMINOPHEN 10 MG/ML IV SOLN
INTRAVENOUS | Status: AC
  Filled 2012-05-13: qty 100

## 2012-05-13 MED ORDER — ONDANSETRON HCL 4 MG PO TABS: 4.0000 mg | ORAL_TABLET | Freq: Four times a day (QID) | ORAL | Status: DC | PRN

## 2012-05-13 MED ORDER — SODIUM CHLORIDE 0.45 % IV SOLN
INTRAVENOUS | Status: DC
  Administered 2012-05-13 – 2012-05-14 (×3): via INTRAVENOUS

## 2012-05-13 MED ORDER — LIDOCAINE HCL 1 % IJ SOLN
INTRAMUSCULAR | Status: AC
  Filled 2012-05-13: qty 20

## 2012-05-13 SURGICAL SUPPLY — 40 items
BAG URINE DRAINAGE (UROLOGICAL SUPPLIES) ×2 IMPLANT
BASKET ZERO TIP NITINOL 2.4FR (BASKET) IMPLANT
BENZOIN TINCTURE PRP APPL 2/3 (GAUZE/BANDAGES/DRESSINGS) ×4 IMPLANT
BLADE SURG 15 STRL LF DISP TIS (BLADE) ×1 IMPLANT
BLADE SURG 15 STRL SS (BLADE) ×1
CATH FOLEY 2W COUNCIL 20FR 5CC (CATHETERS) IMPLANT
CATH FOLEY 2W COUNCIL 5CC 18FR (CATHETERS) ×2 IMPLANT
CATH ROBINSON RED A/P 20FR (CATHETERS) IMPLANT
CATH X-FORCE N30 NEPHROSTOMY (TUBING) ×2 IMPLANT
CLOTH BEACON ORANGE TIMEOUT ST (SAFETY) ×2 IMPLANT
COVER SURGICAL LIGHT HANDLE (MISCELLANEOUS) ×2 IMPLANT
DRAPE C-ARM 42X72 X-RAY (DRAPES) ×2 IMPLANT
DRAPE CAMERA CLOSED 9X96 (DRAPES) ×2 IMPLANT
DRAPE LINGEMAN PERC (DRAPES) ×2 IMPLANT
DRAPE SURG IRRIG POUCH 19X23 (DRAPES) ×2 IMPLANT
DRSG TEGADERM 8X12 (GAUZE/BANDAGES/DRESSINGS) IMPLANT
GLOVE BIOGEL M 8.0 STRL (GLOVE) ×2 IMPLANT
GOWN STRL REIN XL XLG (GOWN DISPOSABLE) ×2 IMPLANT
KIT BASIN OR (CUSTOM PROCEDURE TRAY) ×2 IMPLANT
LASER FIBER DISP (UROLOGICAL SUPPLIES) IMPLANT
LASER FIBER DISP 1000U (UROLOGICAL SUPPLIES) IMPLANT
MANIFOLD NEPTUNE II (INSTRUMENTS) ×2 IMPLANT
NS IRRIG 1000ML POUR BTL (IV SOLUTION) ×2 IMPLANT
PACK BASIC VI WITH GOWN DISP (CUSTOM PROCEDURE TRAY) ×2 IMPLANT
PAD ABD 7.5X8 STRL (GAUZE/BANDAGES/DRESSINGS) IMPLANT
PROBE LITHOCLAST ULTRA 3.8X403 (UROLOGICAL SUPPLIES) IMPLANT
PROBE PNEUMATIC 1.0MMX570MM (UROLOGICAL SUPPLIES) IMPLANT
SET IRRIG Y TYPE TUR BLADDER L (SET/KITS/TRAYS/PACK) ×2 IMPLANT
SET WARMING FLUID IRRIGATION (MISCELLANEOUS) IMPLANT
SPONGE GAUZE 4X4 12PLY (GAUZE/BANDAGES/DRESSINGS) IMPLANT
SPONGE LAP 4X18 X RAY DECT (DISPOSABLE) ×2 IMPLANT
STONE CATCHER W/TUBE ADAPTER (UROLOGICAL SUPPLIES) IMPLANT
SUT SILK 2 0 30  PSL (SUTURE) ×1
SUT SILK 2 0 30 PSL (SUTURE) ×1 IMPLANT
SYR 20CC LL (SYRINGE) ×2 IMPLANT
SYRINGE 10CC LL (SYRINGE) ×2 IMPLANT
TRAY FOLEY BAG SILVER LF 14FR (CATHETERS) ×2 IMPLANT
TRAY FOLEY CATH 14FRSI W/METER (CATHETERS) ×2 IMPLANT
TUBING CONNECTING 10 (TUBING) ×4 IMPLANT
WATER STERILE IRR 1500ML POUR (IV SOLUTION) IMPLANT

## 2012-05-13 NOTE — Anesthesia Postprocedure Evaluation (Signed)
  Anesthesia Post-op Note  Patient: Monica [name]  Procedure(s) Performed: Procedure(s) (LRB): NEPHROLITHOTOMY PERCUTANEOUS (Left)  Patient Location: PACU  Anesthesia Type: General  Level of Consciousness: awake and alert   Airway and Oxygen Therapy: Patient Spontanous Breathing  Post-op Pain: mild  Post-op Assessment: Post-op Vital signs reviewed, Patient's Cardiovascular Status Stable, Respiratory Function Stable, Patent Airway and No signs of Nausea or vomiting  Post-op Vital Signs: stable  Complications: No apparent anesthesia complications

## 2012-05-13 NOTE — Procedures (Signed)
Interventional Radiology Procedure Note  Procedure: Successful left percutaneous nephrostomy and placement of a 50F catheter through a posterior lower pole calyx and into the bladder. Complications: Very difficult access, pt's renal pelvis is very inflamed and friable around the stone.  Access is good, but there is some contrast extravasation around the pelvis and kidney.  Recommendations: - Paged Dr. Retta Diones to discuss - To OR today for PCNL  Signed,  Sterling Big, MD Vascular & Interventional Radiologist [facility]

## 2012-05-13 NOTE — H&P (Signed)
Monica [name] is an 61 y.o. female.   Chief Complaint: Left renal calculi Pt scheduled for OR with Dr Retta Diones this afternoon Percutaneous nephrostomy planned in IR prior to OR HPI: Migraines; PTSD; DM; Sz  Past Medical History  Diagnosis Date  . Chronic nausea     normal GES   . Hemorrhoids 11/17/08    Colonoscopy Dr Karilyn Cota  . Chronic headaches     Dr Hermelinda Medicus [facility] [facility]  . PTSD (post-traumatic stress disorder)   . DM (diabetes mellitus)   . Seizure   . Torticollis   . Elevated liver enzymes     hx of, normal 03/2010  . Headache   . Migraine without aura, with intractable migraine, so stated, without mention of status migrainosus   . Unspecified musculoskeletal disorders and symptoms referable to neck     cervical/trapezius  . Occipital neuralgia   . Posttraumatic stress disorder   . Torticollis   . Cervicalgia   . Complication of anesthesia     had problem with local anesthesia-tried to assist Physician    Past Surgical History  Procedure Date  . Colonoscopy 11/17/08    ext hemorrhoids  . Gastro empy study 03/03/10    normal  . Esophagogastroduodenoscopy 04/05/10    [name] (15F),erosive reflux esophagitis, small hiatal hernia,  . Cholecystectomy   . Neck surgery 1998  . Hand surgery   . Breast lumpectomy     left  . Craniotomy     secondary TBI    Family History  Problem Relation Age of Onset  . Colon cancer Mother 54   Social History:  reports that she has never smoked. She has never used smokeless tobacco. She reports that she does not drink alcohol or use illicit drugs.  Allergies:  Allergies  Allergen Reactions  . Aspirin Itching    REACTION: Hives, slurred speech, blurred vision  . Milnacipran Hcl Other (See Comments)    dizziness  . Pristiq (Desvenlafaxine Succinate Monohydrate) Other (See Comments)    dizziness  . Zanaflex (Tizanidine Hydrochloride) Other (See Comments)    dizzy     (Not in a hospital  admission)  Results for orders placed during the hospital encounter of 05/13/12 (from the past 48 hour(s))  GLUCOSE, CAPILLARY     Status: Normal   Collection Time   05/13/12  8:37 AM      Component Value Range Comment   Glucose-Capillary 73  70 - 99 mg/dL    No results found.  Review of Systems  Constitutional: Negative for fever and weight loss.  Respiratory: Negative for cough.   Cardiovascular: Negative for chest pain.  Gastrointestinal: Positive for abdominal pain. Negative for nausea and vomiting.  Musculoskeletal: Positive for back pain.  Neurological: Positive for headaches.    Blood pressure 109/56, pulse 43, temperature 98.3 F (36.8 C), temperature source Oral, resp. rate 14, SpO2 96.00%. Physical Exam  Constitutional: She is oriented to person, place, and time. She appears well-developed and well-nourished.  Cardiovascular: Normal rate, regular rhythm and normal heart sounds.   No murmur heard. Respiratory: Effort normal and breath sounds normal. She has no wheezes.  GI: Soft. Bowel sounds are normal. There is no tenderness.  Musculoskeletal: Normal range of motion.  Neurological: She is alert and oriented to person, place, and time.  Skin: Skin is warm and dry.  Psychiatric: She has a normal mood and affect. Her behavior is normal. Judgment and thought content normal.     Assessment/Plan Left renal  calculi;  for OR today Scheduled for L PCN now Pt and husband aware of procedure benefits and risks and agreeable to proceed Consent signed and in chart  Rafi Kenneth PAC 05/13/2012, 9:15 AM

## 2012-05-13 NOTE — Progress Notes (Signed)
Dr. Okey Dupre made aware of patient's CBG RESULTS in PACU- 78

## 2012-05-13 NOTE — Op Note (Signed)
Preoperative diagnosis: 25 mm left renal pelvic stone  Postoperative diagnosis: Same   Procedure: Left percutaneous nephrolithotomy    Surgeon: Bertram Millard. Irine Heminger, M.D.   Anesthesia: Gen.   Complications: None  Specimen(s): Stone fragments to family  Drain(s): 18 [place] council tip catheter as nephrostomy drain, Kumpe catheter and ureter  Indications: 61 year-old female with significant left flank pain and a recently diagnosed 25 mm left renal pelvic stone. She recently presented to the office, where percutaneous nephrolithotomy was recommended. Risks and complications have been discussed with the patient. She understands these and desires to proceed.     Technique and findings: The patient had been to interventional radiology, had preprocedural IV antibiotics and a left nephrostomy tube access placed by Dr. Greggory Stallion. She was then taken to the operating room/holding area where left side was properly marked. She was taken to the operating room where general anesthetic was administered. Foley catheter was placed in the urethra. She was then placed prone on the operating table, pressure points were adequately padded. Her left flank was then prepped and draped. Timeout was then performed.  I then passed a 0.038 inch guidewire through the Kumpe catheter which was within the ureter. Fluoroscopic guidance was used to verify proper placement of the distal end of the wire in the bladder. I then removed the Kumpe catheter, and placed a peel-away sheath over top of the existing guidewire. A second guidewire was advanced through the stent the bladder using fluoroscopy. The sheath was then removed. I then placed the NephroMax balloon, such the distal tip came to the stone. The balloon was filled to 16 atmospheres of pressure. Prior to this, the skin incision was made. I then placed the nephrostomy sheath over top of the inflated balloon. The sheath was left in place while the balloon apparatus removed. I  then placed the nephroscope through the sheath, the stone was easily identified. It was then fragmented with the lipoplasty into several fragments which were then easily grasped and removed through the sheath. Inspection of the renal pelvis revealed no further stone burden. Small fragments were within the nephrostomy sheath and were removed. Fluoroscopic inspection revealed no evident calcifications within the patient's kidney. Following this, I then nicked associated a n 18 [place] council tip catheter over top of the working wire, into the renal pelvis.    An antegrade nephrostogram was then performed. There was no evidence extravasation, and easy movement of contrast down to the ureter. I did not see any significant filling defects. The balloon was filled with approximately 2 cc of the contrast mixture, and the sheath was removed. Over the safety guidewire, I then advanced a Kumpe catheter down along side the council tip catheter into the distal ureter using fluoroscopic guidance. This was sutured to the skin with a 2-0 silk. Following the procedure, I then closed the incision with the 2-0 silk placed in a vertical mattress fashion, and then also sutured the council tip catheter to the skin.  Dry sterile dressings were placed. The catheter was hooked to dependent drainage. The patient was then awakened and taken to the PACU in stable condition.

## 2012-05-13 NOTE — Transfer of Care (Signed)
Immediate Anesthesia Transfer of Care Note  Patient: Monica [name]  Procedure(s) Performed: Procedure(s) (LRB) with comments: NEPHROLITHOTOMY PERCUTANEOUS (Left) -    Patient Location: PACU  Anesthesia Type: General  Level of Consciousness: awake and sedated  Airway & Oxygen Therapy: Patient Spontanous Breathing and Patient connected to face mask oxygen  Post-op Assessment: Report given to PACU RN and Post -op Vital signs reviewed and stable  Post vital signs: Reviewed and stable  Complications: No apparent anesthesia complications

## 2012-05-13 NOTE — Progress Notes (Signed)
Arrived from Radiology post nephrostomy insertion dressing on right flank area

## 2012-05-13 NOTE — Progress Notes (Signed)
Dr. Okey Dupre notified of patient's CBG RESULTS  In PACU- 87

## 2012-05-13 NOTE — H&P (Signed)
Agree with PA note.  Left kidney access for PCNL.   Signed,  Sterling Big, MD Vascular & Interventional Radiologist [facility] Radiology

## 2012-05-13 NOTE — Anesthesia Preprocedure Evaluation (Signed)
Anesthesia Evaluation  Patient identified by MRN, date of birth, ID band Patient awake    Reviewed: Allergy & Precautions, H&P , NPO status , Patient's Chart, lab work & pertinent test results  Airway Mallampati: II TM Distance: <3 FB Neck ROM: Full    Dental No notable dental hx.    Pulmonary neg pulmonary ROS,  breath sounds clear to auscultation  Pulmonary exam normal       Cardiovascular negative cardio ROS  Rhythm:Regular Rate:Normal     Neuro/Psych negative neurological ROS  negative psych ROS   GI/Hepatic negative GI ROS, Neg liver ROS, GERD-  Medicated,  Endo/Other  diabetes, Type 2, Oral Hypoglycemic Agents  Renal/GU negative Renal ROS  negative genitourinary   Musculoskeletal negative musculoskeletal ROS (+)   Abdominal   Peds negative pediatric ROS (+)  Hematology negative hematology ROS (+)   Anesthesia Other Findings   Reproductive/Obstetrics negative OB ROS                           Anesthesia Physical Anesthesia Plan  ASA: II  Anesthesia Plan: General   Post-op Pain Management:    Induction: Intravenous  Airway Management Planned: Oral ETT  Additional Equipment:   Intra-op Plan:   Post-operative Plan: Extubation in OR  Informed Consent: I have reviewed the patients History and Physical, chart, labs and discussed the procedure including the risks, benefits and alternatives for the proposed anesthesia with the patient or authorized representative who has indicated his/her understanding and acceptance.   Dental advisory given  Plan Discussed with: CRNA and Surgeon  Anesthesia Plan Comments:         Anesthesia Quick Evaluation

## 2012-05-14 ENCOUNTER — Encounter (HOSPITAL_COMMUNITY): Payer: Self-pay | Admitting: Urology

## 2012-05-14 ENCOUNTER — Inpatient Hospital Stay (HOSPITAL_COMMUNITY): Payer: Medicare Other

## 2012-05-14 LAB — CBC
MCH: 30.6 pg (ref 26.0–34.0)
MCV: 93.7 fL (ref 78.0–100.0)
Platelets: 137 10*3/uL — ABNORMAL LOW (ref 150–400)
RDW: 13.3 % (ref 11.5–15.5)
WBC: 9.5 10*3/uL (ref 4.0–10.5)

## 2012-05-14 MED ORDER — ACETAMINOPHEN 325 MG PO TABS
650.0000 mg | ORAL_TABLET | ORAL | Status: DC | PRN
  Administered 2012-05-14: 650 mg via ORAL
  Filled 2012-05-14: qty 2

## 2012-05-14 NOTE — Progress Notes (Signed)
   CARE MANAGEMENT NOTE 05/14/2012  Patient:  Monica [name], Monica [name]   Account Number:  [id number]  Date Initiated:  05/14/2012  Documentation initiated by:  Jiles Crocker  Subjective/Objective Assessment:   ADMITTED FOR SURGERY - Left percutaneous nephrolithotomy     Action/Plan:   LIVES AT HOME WITH SPOUSE   Anticipated DC Date:  05/16/2012   Anticipated DC Plan:  HOME/SELF CARE      DC Planning Services  CM consult          Status of service:  In process, will continue to follow Medicare Important Message given?  NA - LOS <3 / Initial given by admissions (If response is "NO", the following Medicare IM given date fields will be blank)  Per UR Regulation:  Reviewed for med. necessity/level of care/duration of stay  Comments:  05/14/2012- B Marchelle Rinella RN, BSN, MHA

## 2012-05-14 NOTE — Progress Notes (Signed)
1 Day Post-Op Subjective: Patient reports much less flank pain  Objective: Vital signs in last 24 hours: Temp:  [98.1 F (36.7 C)-99.8 F (37.7 C)] 99.8 F (37.7 C) (10/03 0234) Pulse Rate:  [43-76] 64  (10/03 0234) Resp:  [11-25] 20  (10/03 0400) BP: (108-154)/(54-84) 124/60 mmHg (10/03 0234) SpO2:  [90 %-100 %] 98 % (10/03 0400) Weight:  [71.6 kg (157 lb 13.6 oz)] 71.6 kg (157 lb 13.6 oz) (10/02 1736)  Intake/Output from previous day: 10/02 0701 - 10/03 0700 In: 1943.8 [I.V.:1793.8; IV Piggyback:150] Out: 2150 [Urine:2075; Blood:75] Intake/Output this shift: Total I/O In: 493.8 [I.V.:393.8; IV Piggyback:100] Out: 1200 [Urine:1200]  Physical Exam:  Constitutional: Vital signs reviewed. WD WN in NAD   Flank dressing is dry.  Urine is bloody but without clots  Lab Results:  Basename 05/14/12 0432  HGB 11.2*  HCT 34.3*   BMET No results found for this basename: NA:2,K:2,CL:2,CO2:2,GLUCOSE:2,BUN:2,CREATININE:2,CALCIUM:2 in the last 72 hours No results found for this basename: LABPT:3,INR:3 in the last 72 hours No results found for this basename: LABURIN:1 in the last 72 hours Results for orders placed during the hospital encounter of 05/08/12  SURGICAL PCR SCREEN     Status: Normal   Collection Time   05/08/12  2:15 PM      Component Value Range Status Comment   MRSA, PCR NEGATIVE  NEGATIVE Final    Staphylococcus aureus NEGATIVE  NEGATIVE Final     Studies/Results:  Assessment/Plan:   Postoperative day #1 left percutaneous nephrolithotomy of the 25 mm stone. She is doing well.    I will have a KUB performed to check on fragments. Following KUB, the nephrostomy tube will be plugged. If she is comfortable with this in early afternoon, I will consider having the nephrostomy tube and the Kumpe catheter removed.    I will be out of the office the rest of the day, but will have an associate followup. If she is ready for discharge, that will be arranged later  today, otherwise possible discharge tomorrow   LOS: 1 day   Chelsea Aus 05/14/2012, 5:53 AM

## 2012-05-14 NOTE — Progress Notes (Signed)
Nephrostomy tube plugged for only 5 minutes before pt complained of severe sharp pain to L flank. Nephrostomy returned to drainage bag. Julio Sicks RN

## 2012-05-15 ENCOUNTER — Inpatient Hospital Stay (HOSPITAL_COMMUNITY): Payer: Medicare Other

## 2012-05-15 LAB — BASIC METABOLIC PANEL
GFR calc Af Amer: 89 mL/min — ABNORMAL LOW (ref >90)
GFR calc non Af Amer: 77 mL/min — ABNORMAL LOW (ref >90)
Potassium: 3.4 mEq/L — ABNORMAL LOW (ref 3.5–5.1)
Sodium: 137 mEq/L (ref 135–145)

## 2012-05-15 LAB — CBC
Hemoglobin: 11.2 g/dL — ABNORMAL LOW (ref 12.0–15.0)
MCHC: 33.1 g/dL (ref 30.0–36.0)
RDW: 13.2 % (ref 11.5–15.5)

## 2012-05-15 MED ORDER — FENTANYL CITRATE 0.05 MG/ML IJ SOLN
INTRAMUSCULAR | Status: AC | PRN
  Administered 2012-05-15 (×2): 100 ug via INTRAVENOUS

## 2012-05-15 MED ORDER — ACETAMINOPHEN 10 MG/ML IV SOLN
1000.0000 mg | Freq: Four times a day (QID) | INTRAVENOUS | Status: DC
  Administered 2012-05-15 (×2): 1000 mg via INTRAVENOUS
  Filled 2012-05-15 (×4): qty 100

## 2012-05-15 MED ORDER — OXYCODONE HCL 5 MG PO TABS: 5.0000 mg | ORAL_TABLET | ORAL | Status: DC | PRN

## 2012-05-15 MED ORDER — IOHEXOL 300 MG/ML  SOLN: 25.0000 mL | Freq: Once | INTRAMUSCULAR | Status: DC | PRN

## 2012-05-15 MED ORDER — CEPHALEXIN 500 MG PO CAPS: 500.0000 mg | ORAL_CAPSULE | Freq: Two times a day (BID) | ORAL | Status: DC

## 2012-05-15 MED ORDER — CIPROFLOXACIN IN D5W 400 MG/200ML IV SOLN
400.0000 mg | Freq: Once | INTRAVENOUS | Status: AC
  Administered 2012-05-15: 400 mg via INTRAVENOUS
  Filled 2012-05-15: qty 200

## 2012-05-15 MED ORDER — MIDAZOLAM HCL 5 MG/5ML IJ SOLN
INTRAMUSCULAR | Status: AC | PRN
  Administered 2012-05-15 (×2): 1 mg via INTRAVENOUS

## 2012-05-15 NOTE — Procedures (Signed)
Procedure:  Left nephrostogram and ureteral stent placement. Findings:  Nephrostogram shows relative obstruction of distal ureter near UVJ by debris/clot.  8 Fr x 26 cm ureteral stent placed extending from renal pelvis to bladder.  Good drainage into bladder after stent placement.  Percutaneous nephrostomy access removed.

## 2012-05-15 NOTE — Progress Notes (Signed)
2 Days Post-Op Subjective: Patient reports that she did not sleep well, but her pain is stable. She did not tolerate plugging the nephrostomy tube yesterday.  Objective: Vital signs in last 24 hours: Temp:  [98 F (36.7 C)-98.5 F (36.9 C)] 98 F (36.7 C) (10/04 0506) Pulse Rate:  [50-58] 50  (10/04 0506) Resp:  [16-20] 18  (10/04 0404) BP: (112-138)/(53-73) 138/73 mmHg (10/04 0506) SpO2:  [96 %-98 %] 97 % (10/04 0506)  Intake/Output from previous day: 10/03 0701 - 10/04 0700 In: 1636.3 [P.O.:600; I.V.:886.3; IV Piggyback:150] Out: 3250 [Urine:3250] Intake/Output this shift: Total I/O In: 100 [IV Piggyback:100] Out: 1500 [Urine:1500]  Physical Exam:  Constitutional: Vital signs reviewed. WD WN in NAD   Dressing is dry.  Urine still is slightly bloody.  Lab Results:  Basename 05/14/12 0432  HGB 11.2*  HCT 34.3*   BMET No results found for this basename: NA:2,K:2,CL:2,CO2:2,GLUCOSE:2,BUN:2,CREATININE:2,CALCIUM:2 in the last 72 hours No results found for this basename: LABPT:3,INR:3 in the last 72 hours No results found for this basename: LABURIN:1 in the last 72 hours Results for orders placed during the hospital encounter of 05/08/12  SURGICAL PCR SCREEN     Status: Normal   Collection Time   05/08/12  2:15 PM      Component Value Range Status Comment   MRSA, PCR NEGATIVE  NEGATIVE Final    Staphylococcus aureus NEGATIVE  NEGATIVE Final     Studies/Results:  There are couple small fragments still within her kidney. Assessment/Plan:   Status post percutaneous nephrolithotomy. She did not tolerate her tube cramping yesterday.    I will have a nephrostogram performed today. Hopefully, her kidney/ureter will be patent. If not, I will ask interventional radiology to place a double-J stent.   LOS: 2 days   Marcine Matar M 05/15/2012, 6:43 AM

## 2012-05-15 NOTE — Progress Notes (Signed)
Patient given discharge instructions. Prescription given. Instructions given for post op discharge instructions from surgery. All questions and concerns addressed. Patient dressing to L. Flank area intact. No drainage noted. IV discontinued. PT stable at discharge.

## 2012-05-15 NOTE — Progress Notes (Signed)
Agree 

## 2012-05-15 NOTE — Discharge Summary (Signed)
Patient ID: Monica [name] MRN: [id number] DOB/AGE: 02-08-1951 61 y.o.  Admit date: 05/13/2012 Discharge date: 05/15/2012  Primary Care Physician:  Kirstie Peri, MD  Discharge Diagnoses:   1. Left renal stone, large  2. Chronic pain  3. PTSD  4. Diabetes mellitus Consults:  None    Discharge Medications:   Medication List     As of 05/15/2012  5:27 PM    STOP taking these medications         diclofenac 75 MG EC tablet   Commonly known as: VOLTAREN      TAKE these medications         cephALEXin 500 MG capsule   Commonly known as: KEFLEX   Take 1 capsule (500 mg total) by mouth 2 (two) times daily.      cholecalciferol 1000 UNITS tablet   Commonly known as: VITAMIN D   Take 1,000 Units by mouth every morning.      fentaNYL 1200 MCG lollipop   Commonly known as: ACTIQ   Place 1,200 mcg inside cheek daily as needed.      glimepiride 4 MG tablet   Commonly known as: AMARYL   Take 4 mg by mouth daily before breakfast.      IMITREX 100 MG tablet   Generic drug: SUMAtriptan   TAKE (1) TABLET BY MOUTH ONCE DAILY AS NEEDED.      KLONOPIN 0.5 MG tablet   Generic drug: clonazePAM   TAKE (1) TABLET EVERY TWELVE HOURS.      metFORMIN 500 MG 24 hr tablet   Commonly known as: GLUCOPHAGE-XR   Take 1,000 mg by mouth 2 (two) times daily.      methadone 10 MG tablet   Commonly known as: DOLOPHINE   Take 20 mg by mouth 3 (three) times daily.      methylPREDNIsolone 4 MG tablet   Commonly known as: MEDROL DOSPACK      ondansetron 4 MG tablet   Commonly known as: ZOFRAN   Take 1 tablet (4 mg total) by mouth every 6 (six) hours as needed for nausea.      pantoprazole 40 MG tablet   Commonly known as: PROTONIX   Take 1 tablet (40 mg total) by mouth 2 (two) times daily.      SKELAXIN 800 MG tablet   Generic drug: metaxalone   TAKE (1) TABLET BY MOUTH FOUR TIMES DAILY AS NEEDED.      TOPAMAX 100 MG tablet   Generic drug: topiramate   TAKE 1 TABLET IN THE MORNING  AND 2 TABLETS AT BEDTIME.         Significant Diagnostic Studies:  Dg C-arm 1-60 Min-no Report  05/13/2012  CLINICAL DATA: Left percutaneous nephrolithotomy   C-ARM 1-60 MINUTES  Fluoroscopy was utilized by the requesting physician.  No radiographic  interpretation.     Ir [place] Abts Cath Perc Left  05/13/2012  *RADIOLOGY REPORT*  FLUOROSCOPIC GUIDANCE FOR PUNCTURE OF THE LEFT RENAL COLLECTING SYSTEM; PERCUTANEOUS NEPHROSTOMY TUBE  Date:  05/13/2012  Clinical History: Renal stones, access for left percutaneous nephrolithotomy.  Procedures Performed:    1.    Percutaneous puncture of the renal collecting system under       fluoroscopic guidance 2.    Placement of a percutaneous nephrostomy tube  Comparison: CT of the abdomen and pelvis - 05/04/2012  Operating Physician:  Sterling Big, MD  Sedation: Moderate (conscious) sedation was used.  6 mg Versed, 400 mcg Fentanyl and 2 mg Dilaudid  were administered intravenously. The patient's vital signs were monitored continuously by radiology nursing throughout the procedure.  400 mg Cipro was administered IV at an appropriate time prior to skin puncture.  Sedation Time: 75 minutes  Fluoroscopy time: 24.9 minutes  Contrast volume: 50 ml Omnipaque-300 instilled into the collecting system.  PROCEDURE/FINDINGS:  Informed written consent was obtained from the patient after a discussion of the risks, benefits, and alternatives to treatment. The left flank region was prepped with Betadine in a sterile fashion, and a sterile drape was applied covering the operative field.  A sterile gown and sterile gloves were used for the procedure.  A timeout was performed prior to the initiation of the procedure.  A pre procedural spot fluoroscopic image was obtained of the upper abdomen.  Ultrasound scanning performed of the kidney was negative for significant hydronephrosis.  As such, the stone within left renal pelvis was targeted fluoroscopically with a 22 gauge Chiba  needle.  Access to the collecting system was confirmed by aspiration of the urine. The renal collecting system was opacified by hand injection of contrast material.  There is no significant hydronephrosis.  A small amount of air was injected into the collecting system to help delineate a posterior calyx.  A posterior inferior calyx was targeted with a 21 gauge Accustick needle. Access to the calyx was confirmed with advancement of a 0.018 inches Glidewire into the collecting system.  An Accustick set was utilized to dilate the tract and the 4-French guiding sheath was advanced into the lower pole infundibulum.  At this point, there was significant difficulty with passing a wire beyond the stone in the renal pelvis and into the ureter.  The renal pelvis was significantly inflamed, and the urothelium very tight about the renal stone.  The patient was very sensitive to wire manipulation around the stone.  Additionally, the renal pelvis was highly friable and there was extravasation of contrast material from the renal pelvis into the peripelvic, and perinephric soft tissues. Ultimately, a 0.035-inch glide wire was successfully advanced beyond this stone, and into the ureter.  The Kumpe catheter was advanced down the ureter and into the urinary bladder.  Postprocedural spot radiographs were obtained in various obliquities and the catheter was sutured to the skin.  The catheter was capped and a dressing was placed.  The patient tolerated the procedure well without immediate postprocedural complication.  Complications: None immediate  IMPRESSION:  Successful fluoroscopic guided left percutaneous nephrostomy with placement of a 5 French Kumpe catheter to the level of the urinary bladder to be utilized during impending nephrolithotomy procedure.  The renal pelvis is inflamed, thickened and friable.  It was difficult to pass the wire and catheter beyond the stone and into the ureter.  There was extravasation of contrast  material into the peripelvic, and perinephric space suggesting at least micro perforation of the renal pelvis.  These findings were discussed with Dr. Retta Diones immediately following the procedure by Dr. Archer Asa at approximately 11:00 a.m. on 05/13/2012.  Signed,  Sterling Big, MD Vascular & Interventional Radiologist [facility] Radiology   Original Report Authenticated By: Rebbeca Paul and P: For complete details please refer to admission H and P, but in brief the patient was admitted for percutaneous nephrolithotomy. She tolerated this or seizure well. On postoperative day #1, her nephrostomy tube was clamped. She did not tolerate this well. On postoperative day #2, a double-J stent was placed percutaneously by Dr. Fredia Sorrow, and her nephrostomy tube  was removed. She was discharged in stable condition at that time. Hospital Course:    Day of Discharge BP 100/56  Pulse 53  Temp 97.9 F (36.6 C) (Oral)  Resp 16  Ht 5\' 4"  (1.626 m)  Wt 71.6 kg (157 lb 13.6 oz)  BMI 27.09 kg/m2  SpO2 96%  Results for orders placed during the hospital encounter of 05/13/12 (from the past 24 hour(s))  CBC     Status: Abnormal   Collection Time   05/15/12 10:35 AM      Component Value Range   WBC 7.1  4.0 - 10.5 K/uL   RBC 3.57 (*) 3.87 - 5.11 MIL/uL   Hemoglobin 11.2 (*) 12.0 - 15.0 g/dL   HCT 16.1 (*) 09.6 - 04.5 %   MCV 94.7  78.0 - 100.0 fL   MCH 31.4  26.0 - 34.0 pg   MCHC 33.1  30.0 - 36.0 g/dL   RDW 40.9  81.1 - 91.4 %   Platelets 141 (*) 150 - 400 K/uL  BASIC METABOLIC PANEL     Status: Abnormal   Collection Time   05/15/12 10:35 AM      Component Value Range   Sodium 137  135 - 145 mEq/L   Potassium 3.4 (*) 3.5 - 5.1 mEq/L   Chloride 104  96 - 112 mEq/L   CO2 25  19 - 32 mEq/L   Glucose, Bld 145 (*) 70 - 99 mg/dL   BUN 9  6 - 23 mg/dL   Creatinine, Ser 7.82  0.50 - 1.10 mg/dL   Calcium 8.6  8.4 - 95.6 mg/dL   GFR calc non Af Amer 77 (*) >90 mL/min   GFR calc Af Amer 89  (*) >90 mL/min    Physical Exam: General: Alert and awake oriented x3 not in any acute distress. HEENT: anicteric sclera, pupils reactive to light and accommodation CVS: S1-S2 clear no murmur rubs or gallops Chest: clear to auscultation bilaterally, no wheezing rales or rhonchi Abdomen: soft nontender, nondistended, normal bowel sounds, no organomegaly Extremities: no cyanosis, clubbing or edema noted bilaterally Neuro: Cranial nerves II-XII intact, no focal neurological deficits  Disposition: *Home Diet: No prescriptions  Activity: Discussed with patient   Disposition and Follow-up:     Discharge Orders    Future Appointments: Provider: Department: Dept Phone: Center:   05/20/2012 11:20 AM Ranelle Oyster, MD [facility] [phone] CPR     Future Orders Please Complete By Expires   Discharge patient          TESTS THAT NEED FOLLOW-UP   DISCHARGE FOLLOW-UP Follow-up Information    Follow up with Chelsea Aus, MD. (as scheduled)    Contact information:   [address] [place] [address] [phone]          Time spent on Discharge: *10 minutes Signed: Chelsea Aus 05/15/2012, 5:27 PM

## 2012-05-15 NOTE — Progress Notes (Signed)
2 Days Post-Op  Subjective: Pt doing ok today; still with left flank discomfort; did not tolerate left nephrostomy tube/red robin capping Objective: Vital signs in last 24 hours: Temp:  [98 F (36.7 C)-98.5 F (36.9 C)] 98 F (36.7 C) (10/04 0506) Pulse Rate:  [50-58] 50  (10/04 0506) Resp:  [16-20] 18  (10/04 0404) BP: (112-138)/(53-73) 138/73 mmHg (10/04 0506) SpO2:  [96 %-98 %] 97 % (10/04 0506) Last BM Date: 05/13/12  Intake/Output from previous day: 10/03 0701 - 10/04 0700 In: 1636.3 [P.O.:600; I.V.:886.3; IV Piggyback:150] Out: 3250 [Urine:3250] Intake/Output this shift: Total I/O In: -  Out: 450 [Urine:450]  Pt awake/alert; chest- CTA bilat; heart- RRR; abd- soft,+BS,NT; left CVA tenderness; left red robin nephrostomy drain/nephroureteral cath intact draining bloody urine, dressing dry  Lab Results:   Basename 05/14/12 0432  WBC 9.5  HGB 11.2*  HCT 34.3*  PLT 137*   BMET No results found for this basename: NA:2,K:2,CL:2,CO2:2,GLUCOSE:2,BUN:2,CREATININE:2,CALCIUM:2 in the last 72 hours PT/INR No results found for this basename: LABPROT:2,INR:2 in the last 72 hours ABG No results found for this basename: PHART:2,PCO2:2,PO2:2,HCO3:2 in the last 72 hours  Studies/Results: Abd 1 View (kub)  05/14/2012  *RADIOLOGY REPORT*  Clinical Data: 61 year old female status post left kidney surgery. Abdominal pain.  ABDOMEN - 1 VIEW  Comparison: CT abdomen pelvis 05/04/2012.  Findings: Portable supine AP view 1009 hours.  Percutaneous left Nephro ureteral tight stent in place.  A second larger caliber nephrostomy tube in place.  Prominent left-sided renal pelvis calculus no longer identified. Nonobstructed bowel gas pattern. Stable right upper quadrant surgical clips. Stable visualized osseous structures.  IMPRESSION: Left nephrostomy and Nephro ureteral catheters in place. Nonobstructed bowel gas pattern.   Original Report Authenticated By: Harley Hallmark, M.D.    Dg C-arm 1-60  Min-no Report  05/13/2012  CLINICAL DATA: Left percutaneous nephrolithotomy   C-ARM 1-60 MINUTES  Fluoroscopy was utilized by the requesting physician.  No radiographic  interpretation.     Ir [place] Abts Cath Perc Left  05/13/2012  *RADIOLOGY REPORT*  FLUOROSCOPIC GUIDANCE FOR PUNCTURE OF THE LEFT RENAL COLLECTING SYSTEM; PERCUTANEOUS NEPHROSTOMY TUBE  Date:  05/13/2012  Clinical History: Renal stones, access for left percutaneous nephrolithotomy.  Procedures Performed:    1.    Percutaneous puncture of the renal collecting system under       fluoroscopic guidance 2.    Placement of a percutaneous nephrostomy tube  Comparison: CT of the abdomen and pelvis - 05/04/2012  Operating Physician:  Sterling Big, MD  Sedation: Moderate (conscious) sedation was used.  6 mg Versed, 400 mcg Fentanyl and 2 mg Dilaudid were administered intravenously. The patient's vital signs were monitored continuously by radiology nursing throughout the procedure.  400 mg Cipro was administered IV at an appropriate time prior to skin puncture.  Sedation Time: 75 minutes  Fluoroscopy time: 24.9 minutes  Contrast volume: 50 ml Omnipaque-300 instilled into the collecting system.  PROCEDURE/FINDINGS:  Informed written consent was obtained from the patient after a discussion of the risks, benefits, and alternatives to treatment. The left flank region was prepped with Betadine in a sterile fashion, and a sterile drape was applied covering the operative field.  A sterile gown and sterile gloves were used for the procedure.  A timeout was performed prior to the initiation of the procedure.  A pre procedural spot fluoroscopic image was obtained of the upper abdomen.  Ultrasound scanning performed of the kidney was negative for significant hydronephrosis.  As such, the  stone within left renal pelvis was targeted fluoroscopically with a 22 gauge Chiba needle.  Access to the collecting system was confirmed by aspiration of the urine. The renal  collecting system was opacified by hand injection of contrast material.  There is no significant hydronephrosis.  A small amount of air was injected into the collecting system to help delineate a posterior calyx.  A posterior inferior calyx was targeted with a 21 gauge Accustick needle. Access to the calyx was confirmed with advancement of a 0.018 inches Glidewire into the collecting system.  An Accustick set was utilized to dilate the tract and the 4-French guiding sheath was advanced into the lower pole infundibulum.  At this point, there was significant difficulty with passing a wire beyond the stone in the renal pelvis and into the ureter.  The renal pelvis was significantly inflamed, and the urothelium very tight about the renal stone.  The patient was very sensitive to wire manipulation around the stone.  Additionally, the renal pelvis was highly friable and there was extravasation of contrast material from the renal pelvis into the peripelvic, and perinephric soft tissues. Ultimately, a 0.035-inch glide wire was successfully advanced beyond this stone, and into the ureter.  The Kumpe catheter was advanced down the ureter and into the urinary bladder.  Postprocedural spot radiographs were obtained in various obliquities and the catheter was sutured to the skin.  The catheter was capped and a dressing was placed.  The patient tolerated the procedure well without immediate postprocedural complication.  Complications: None immediate  IMPRESSION:  Successful fluoroscopic guided left percutaneous nephrostomy with placement of a 5 French Kumpe catheter to the level of the urinary bladder to be utilized during impending nephrolithotomy procedure.  The renal pelvis is inflamed, thickened and friable.  It was difficult to pass the wire and catheter beyond the stone and into the ureter.  There was extravasation of contrast material into the peripelvic, and perinephric space suggesting at least micro perforation of the  renal pelvis.  These findings were discussed with Dr. Retta Diones immediately following the procedure by Dr. Archer Asa at approximately 11:00 a.m. on 05/13/2012.  Signed,  Sterling Big, MD Vascular & Interventional Radiologist [facility] Radiology   Original Report Authenticated By: Vilma Prader     Anti-infectives: Anti-infectives     Start     Dose/Rate Route Frequency Ordered Stop   05/13/12 1600   ceFAZolin (ANCEF) IVPB 1 g/50 mL premix  Status:  Discontinued        1 g 100 mL/hr over 30 Minutes Intravenous 3 times per day 05/13/12 1553 05/15/12 0742   05/13/12 1141   ceFAZolin (ANCEF) IVPB 2 g/50 mL premix  Status:  Discontinued        2 g 100 mL/hr over 30 Minutes Intravenous 30 min pre-op 05/13/12 1141 05/13/12 1624   05/13/12 0000   cephALEXin (KEFLEX) 500 MG capsule        500 mg Oral 2 times daily 05/13/12 1458            Assessment/Plan: s/p left PCN/nephrolithotomy 10/2 for renal calculi; failed PCN capping; plan is for nephrostogram today with possible ureteral stenting if needed. Details/risks of above d/w pt with her understanding and consent.   LOS: 2 days    Kerria Sapien,D [facility] 05/15/2012

## 2012-05-18 ENCOUNTER — Telehealth: Payer: Self-pay | Admitting: Physical Medicine & Rehabilitation

## 2012-05-18 DIAGNOSIS — M47812 Spondylosis without myelopathy or radiculopathy, cervical region: Secondary | ICD-10-CM

## 2012-05-18 DIAGNOSIS — G43919 Migraine, unspecified, intractable, without status migrainosus: Secondary | ICD-10-CM

## 2012-05-18 MED ORDER — FENTANYL CITRATE 1200 MCG BU LPOP: 1200.0000 ug | Freq: Every day | BUCCAL | Status: DC | PRN

## 2012-05-18 MED ORDER — METHADONE HCL 10 MG PO TABS: 20.0000 mg | ORAL_TABLET | Freq: Three times a day (TID) | ORAL | Status: DC

## 2012-05-18 NOTE — Telephone Encounter (Signed)
Patient just got out of hosp on 05/16/12..had kidney surgery not feeling well, wants to know if husband [name] can pick up prescriptions up.  Please call her at [phone].

## 2012-05-18 NOTE — Telephone Encounter (Signed)
Printed for [name] to sign tomorrow.

## 2012-05-19 ENCOUNTER — Other Ambulatory Visit: Payer: Self-pay | Admitting: Physical Medicine & Rehabilitation

## 2012-05-19 NOTE — Telephone Encounter (Signed)
Rx is ready to be picked up Pt aware 

## 2012-05-20 ENCOUNTER — Telehealth: Payer: Self-pay

## 2012-05-20 ENCOUNTER — Ambulatory Visit: Payer: Self-pay | Admitting: Physical Medicine & Rehabilitation

## 2012-05-20 MED ORDER — METHADONE HCL 10 MG PO TABS: 20.0000 mg | ORAL_TABLET | Freq: Three times a day (TID) | ORAL | Status: DC

## 2012-05-20 NOTE — Telephone Encounter (Signed)
Heather from [facility] called regarding methadone script.  Recent script was written with a quantity of 30 not 180.  Dr [name] verified there has not been any changed.  New script printed and mailed to [facility] per Dr [name] ok.

## 2012-05-26 ENCOUNTER — Other Ambulatory Visit: Payer: Self-pay | Admitting: Urology

## 2012-05-26 ENCOUNTER — Ambulatory Visit (HOSPITAL_COMMUNITY)
Admission: RE | Admit: 2012-05-26 | Discharge: 2012-05-26 | Disposition: A | Discharge status: Home / Self Care | Payer: Medicare Other | Source: Ambulatory Visit | Attending: Urology | Admitting: Urology

## 2012-05-26 ENCOUNTER — Ambulatory Visit (INDEPENDENT_AMBULATORY_CARE_PROVIDER_SITE_OTHER): Payer: Medicare Other | Admitting: Urology

## 2012-05-26 DIAGNOSIS — N200 Calculus of kidney: Secondary | ICD-10-CM | POA: Insufficient documentation

## 2012-06-16 ENCOUNTER — Encounter: Payer: Self-pay | Admitting: Physical Medicine & Rehabilitation

## 2012-06-16 ENCOUNTER — Encounter
Payer: Worker's Compensation | Attending: Physical Medicine & Rehabilitation | Admitting: Physical Medicine & Rehabilitation

## 2012-06-16 VITALS — BP 145/89 | HR 56 | Resp 18 | Ht 64.0 in | Wt 154.0 lb

## 2012-06-16 DIAGNOSIS — F329 Major depressive disorder, single episode, unspecified: Secondary | ICD-10-CM

## 2012-06-16 DIAGNOSIS — F4310 Post-traumatic stress disorder, unspecified: Secondary | ICD-10-CM | POA: Insufficient documentation

## 2012-06-16 DIAGNOSIS — Z5181 Encounter for therapeutic drug level monitoring: Secondary | ICD-10-CM

## 2012-06-16 DIAGNOSIS — M542 Cervicalgia: Secondary | ICD-10-CM | POA: Insufficient documentation

## 2012-06-16 DIAGNOSIS — R51 Headache: Secondary | ICD-10-CM | POA: Insufficient documentation

## 2012-06-16 DIAGNOSIS — M47812 Spondylosis without myelopathy or radiculopathy, cervical region: Secondary | ICD-10-CM

## 2012-06-16 DIAGNOSIS — F603 Borderline personality disorder: Secondary | ICD-10-CM | POA: Insufficient documentation

## 2012-06-16 DIAGNOSIS — M129 Arthropathy, unspecified: Secondary | ICD-10-CM | POA: Insufficient documentation

## 2012-06-16 DIAGNOSIS — M5380 Other specified dorsopathies, site unspecified: Secondary | ICD-10-CM

## 2012-06-16 DIAGNOSIS — G43919 Migraine, unspecified, intractable, without status migrainosus: Secondary | ICD-10-CM

## 2012-06-16 MED ORDER — SUMATRIPTAN SUCCINATE 100 MG PO TABS: 100.0000 mg | ORAL_TABLET | ORAL | Status: DC | PRN

## 2012-06-16 MED ORDER — METHADONE HCL 10 MG PO TABS: 20.0000 mg | ORAL_TABLET | Freq: Three times a day (TID) | ORAL | Status: DC

## 2012-06-16 MED ORDER — FENTANYL CITRATE 1200 MCG BU LPOP: 1200.0000 ug | Freq: Every day | BUCCAL | Status: DC | PRN

## 2012-06-16 NOTE — Patient Instructions (Signed)
CALL ME WITHY QUESTIONS
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
## 2012-06-16 NOTE — Addendum Note (Signed)
Addended by: Judd Gaudier on: 06/16/2012 01:22 PM   Modules accepted: Orders

## 2012-06-25 ENCOUNTER — Telehealth: Payer: Self-pay | Admitting: Physical Medicine & Rehabilitation

## 2012-06-25 NOTE — Telephone Encounter (Signed)
This information is in my progress note
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
## 2012-07-06 ENCOUNTER — Telehealth: Payer: Self-pay | Admitting: General Practice

## 2012-07-06 ENCOUNTER — Other Ambulatory Visit: Payer: Self-pay | Admitting: Physical Medicine & Rehabilitation

## 2012-07-06 NOTE — Telephone Encounter (Signed)
Patient stated she received a EOB from [facility] and they denied payment for the 9/23 visit.  She said the reason for the denial stated it was sent in as a Freight forwarder.  I explained to the patient I needed to follow-up with our third party billing company and give her a call back.   I placed a call to Daisy Blossom with [employer] at [phone] and was told her would investigate and get back to me by the end of business today.

## 2012-07-13 NOTE — Telephone Encounter (Signed)
I called and spoke with Monica [name] to f/u from our call on 11/25.  He stated he will f/u with the rep that was handling that claim and get back to me by 12 noon today.

## 2012-07-13 NOTE — Telephone Encounter (Signed)
I received a call from Peyton Najjar with [employer] and he spoke with Chales Abrahams at [facility] about the claim for visit 9/23.  She stated she was going to reprocess the claim w/o the [employer].  This process should take about 7-10 business days.   Refrence number for the [id number] Reference number for the claim: [id number]

## 2012-07-13 NOTE — Telephone Encounter (Signed)
I spoke with the patient and made her aware of the change.

## 2012-07-14 ENCOUNTER — Encounter: Payer: Self-pay | Admitting: Physical Medicine and Rehabilitation

## 2012-07-14 ENCOUNTER — Encounter
Payer: Worker's Compensation | Attending: Physical Medicine and Rehabilitation | Admitting: Physical Medicine and Rehabilitation

## 2012-07-14 VITALS — BP 153/88 | HR 52 | Resp 14 | Ht 64.0 in | Wt 152.8 lb

## 2012-07-14 DIAGNOSIS — M542 Cervicalgia: Secondary | ICD-10-CM | POA: Insufficient documentation

## 2012-07-14 DIAGNOSIS — M5380 Other specified dorsopathies, site unspecified: Secondary | ICD-10-CM

## 2012-07-14 DIAGNOSIS — G8929 Other chronic pain: Secondary | ICD-10-CM | POA: Insufficient documentation

## 2012-07-14 DIAGNOSIS — F3289 Other specified depressive episodes: Secondary | ICD-10-CM | POA: Insufficient documentation

## 2012-07-14 DIAGNOSIS — R51 Headache: Secondary | ICD-10-CM | POA: Insufficient documentation

## 2012-07-14 DIAGNOSIS — M47812 Spondylosis without myelopathy or radiculopathy, cervical region: Secondary | ICD-10-CM | POA: Insufficient documentation

## 2012-07-14 DIAGNOSIS — G43919 Migraine, unspecified, intractable, without status migrainosus: Secondary | ICD-10-CM

## 2012-07-14 DIAGNOSIS — F4310 Post-traumatic stress disorder, unspecified: Secondary | ICD-10-CM | POA: Insufficient documentation

## 2012-07-14 DIAGNOSIS — F329 Major depressive disorder, single episode, unspecified: Secondary | ICD-10-CM | POA: Insufficient documentation

## 2012-07-14 MED ORDER — METHADONE HCL 10 MG PO TABS: 20.0000 mg | ORAL_TABLET | Freq: Three times a day (TID) | ORAL | Status: DC

## 2012-07-14 MED ORDER — FENTANYL CITRATE 1200 MCG BU LPOP: 1200.0000 ug | Freq: Every day | BUCCAL | Status: DC | PRN

## 2012-07-14 NOTE — Patient Instructions (Addendum)
Stay as active as tolerated, continue with your walking with your dogs regularly.

## 2012-07-14 NOTE — Progress Notes (Signed)
Subjective:    Patient ID: Monica [name], female    DOB: 1951/06/26, 61 y.o.   MRN: [id number]  HPI The patient complains about chronic headaches which radiates into her neck.  The problem has been stable. The patient reports that she has been paying more attention to her posture and that has helped a little with her headaches. She also states, that walking with her dogs and crafting help her to relax and not concentrate on her pain.She reports, that she did not get an approval for a psychiatrist visit yet, her lawyer is working on that.  Pain Inventory Average Pain 8 Pain Right Now 9 My pain is constant  In the last 24 hours, has pain interfered with the following? General activity 9 Relation with others 9 Enjoyment of life 9 What TIME of day is your pain at its worst? all of the time Sleep (in general) Poor  Pain is worse with: bending and some activites Pain improves with: heat/ice, pacing activities and medication Relief from Meds: 2  Mobility walk without assistance  Function disabled: date disabled   Neuro/Psych No problems in this area  Prior Studies CT/MRI of eyes @ opthamologist  Physicians involved in your care Any changes since last visit?  no   Family History  Problem Relation Age of Onset  . Colon cancer Mother 61   History   Social History  . Marital Status: Married    Spouse Name: N/A    Number of Children: 3  . Years of Education: N/A   Occupational History  . disabled    Social History Main Topics  . Smoking status: Never Smoker   . Smokeless tobacco: Never Used  . Alcohol Use: No  . Drug Use: No  . Sexually Active: None   Other Topics Concern  . None   Social History Narrative  . None   Past Surgical History  Procedure Date  . Colonoscopy 11/17/08    ext hemorrhoids  . Gastro empy study 03/03/10    normal  . Esophagogastroduodenoscopy 04/05/10    [name] (72F),erosive reflux esophagitis, small hiatal hernia,  .  Cholecystectomy   . Neck surgery 1998  . Hand surgery   . Breast lumpectomy     left  . Craniotomy     secondary TBI  . Nephrolithotomy 05/13/2012    Procedure: NEPHROLITHOTOMY PERCUTANEOUS;  Surgeon: Marcine Matar, MD;  Location: [facility];  Service: Urology;  Laterality: Left;      Past Medical History  Diagnosis Date  . Chronic nausea     normal GES   . Hemorrhoids 11/17/08    Colonoscopy Dr Karilyn Cota  . Chronic headaches     Dr Hermelinda [facility]us [facility]  . PTSD (post-traumatic stress disorder)   . DM (diabetes mellitus)   . Seizure   . Torticollis   . Elevated liver enzymes     hx of, normal 03/2010  . Headache   . Migraine without aura, with intractable migraine, so stated, without mention of status migrainosus   . Unspecified musculoskeletal disorders and symptoms referable to neck     cervical/trapezius  . Occipital neuralgia   . Posttraumatic stress disorder   . Torticollis   . Cervicalgia   . Complication of anesthesia     had problem with local anesthesia-tried to assist Physician   BP 153/88  Pulse 52  Resp 14  Ht 5\' 4"  (1.626 m)  Wt 152 lb 12.8 oz (69.31 kg)  BMI 26.23 kg/m2  SpO2 98%    Review of Systems  Neurological: Positive for headaches.  All other systems reviewed and are negative.       Objective:   Physical Exam  Constitutional: She appears well-developed and well-nourished.  HENT:  Head: Normocephalic.  Musculoskeletal: She exhibits tenderness.  Neurological: She is alert.  Skin: Skin is warm and dry.  Psychiatric: She has a normal mood and affect.    Symmetric normal motor tone is noted throughout. Normal muscle bulk. Muscle testing reveals 5/5 muscle strength of the upper extremity, and 5/5 of the lower extremity. Full range of motion in upper and lower extremities. ROM of Cervical spine is Restricted, because of pain. Fine motor movements are normal in both hands.  Sensory is intact and symmetric to light touch, pinprick and  proprioception.  DTR in the upper and lower extremity are present and symmetric 2+. No clonus is noted.  Patient arises from chair without difficulty. Narrow based gait with normal arm swing bilateral , able to walk on heels and toes . Tandem walk is stable. No pronator drift. Rhomberg negative.  Tenderness in descending trapezius bilateral.  Kyphotic posture in T spine       Assessment & Plan:  1. History of cervicalgia, facet arthropathy, and chronic headaches.  2. Posttraumatic stress syndrome.Depression  PLAN:  1. I gave her a prescription for her methadone and Actiq .  2. [name] will see her back in about 1 months.  3. Advised patient to pay attention to her posture, which also aggrevates her symptoms, also showed her some tricks to keep a good posture to practise at home.  4. I still think it would be very beneficial for her to see a psychiatrist, and work on coping skills for her pain, and also to address her mental diagnoses.

## 2012-07-15 ENCOUNTER — Ambulatory Visit (INDEPENDENT_AMBULATORY_CARE_PROVIDER_SITE_OTHER): Payer: Medicare Other | Admitting: Urgent Care

## 2012-07-15 ENCOUNTER — Encounter: Payer: Self-pay | Admitting: Urgent Care

## 2012-07-15 VITALS — BP 126/61 | HR 47 | Temp 97.0°F | Ht 64.0 in | Wt 154.6 lb

## 2012-07-15 DIAGNOSIS — K219 Gastro-esophageal reflux disease without esophagitis: Secondary | ICD-10-CM

## 2012-07-15 DIAGNOSIS — K921 Melena: Secondary | ICD-10-CM

## 2012-07-15 DIAGNOSIS — R110 Nausea: Secondary | ICD-10-CM

## 2012-07-15 DIAGNOSIS — R7402 Elevation of levels of lactic acid dehydrogenase (LDH): Secondary | ICD-10-CM

## 2012-07-15 NOTE — Progress Notes (Signed)
Referring Provider: Kirstie Peri, MD Primary Care Physician:  Kirstie Peri, MD Primary Gastroenterologist:  Dr. Jena Gauss  Chief Complaint  Patient presents with  . Colonoscopy  . Abdominal Pain    HPI:  Monica [name] is a 61 y.o. female here for followup for hemoccult positive stool & abdominal pain.  She was last seen by me 05/04/12.  At that time she had severe abdominal pain, nausea, and vomiting. Urgent work-up with CT scan showed renal lithiasis and she was urgently sent for evaluation by Dr. Janifer Adie. She had nephrolithotomy and has done well since then. She has returned to set up colonoscopy and EGD for hematochezia and chronic nausea.  She continues to see bright red blood in moderate amounts in her stool & on the toilet tissue.  [facility].  She tells me her diet consists of [employer] buttermilk waffles & icecream.  She denies vomiting, but c/o chronic nausea.  There is no particular time of day for her nausea. She has been using Zofran when necessary and this worked very well for her. She denies heartburn or indigestion since she is taking Protonix 40 mg twice a day. She rarely takes Catering manager.  She continues to fear eating. Over the holidays, she was unable to enjoy Thanksgiving with her family. Any time she eats she has abdominal pain.  Pain is "all over" .  The pain lasts 2-3 hrs.  Pain is constant aching during that time, then resolves.  Denies constipation, straining or diarrhea.  She has been on Voltaren previously, but denies any over-the-counter NSAIDs.  Last colonoscopy was in 2010 by Dr. Karilyn Cota & she had external hemorrhoids. In August 2011, she had an EGD by Dr. Jena Gauss was found to have Schatzki's ring, erosive reflux esophagitis and hiatal hernia. She was dilated with a Elease Hashimoto 56 F.  Past Medical History  Diagnosis Date  . Chronic nausea     normal GES   . Hemorrhoids 11/17/08    Colonoscopy Dr Karilyn Cota  . Chronic  headaches     Dr Hermelinda Medicus [place] [facility]  . PTSD (post-traumatic stress disorder)   . DM (diabetes mellitus)   . Seizure   . Torticollis   . Elevated liver enzymes     hx of, normal 03/2010  . Headache   . Migraine without aura, with intractable migraine, so stated, without mention of status migrainosus   . Unspecified musculoskeletal disorders and symptoms referable to neck     cervical/trapezius  . Occipital neuralgia   . Posttraumatic stress disorder   . Torticollis   . Cervicalgia   . Complication of anesthesia     had problem with local anesthesia-tried to assist Physician    Past Surgical History  Procedure Date  . Colonoscopy 11/17/08    ext hemorrhoids  . Gastro empy study 03/03/10    normal  . Esophagogastroduodenoscopy 04/05/10    [name] (98F),erosive reflux esophagitis, small hiatal hernia,  . Cholecystectomy   . Neck surgery 1998  . Hand surgery   . Breast lumpectomy     left  . Craniotomy     secondary TBI  . Nephrolithotomy 05/13/2012    Procedure: NEPHROLITHOTOMY PERCUTANEOUS;  Surgeon: Marcine Matar, MD;  Location: [facility];  Service: Urology;  Laterality: Left;       Current Outpatient Prescriptions  Medication Sig Dispense Refill  . cholecalciferol (VITAMIN D) 1000 UNITS tablet Take 1,000 Units by mouth every morning.       Marland Kitchen  fentaNYL (ACTIQ) 1200 MCG lollipop Place 1,200 mcg inside cheek daily as needed.  30 tablet  0  . glimepiride (AMARYL) 4 MG tablet Take 4 mg by mouth daily before breakfast.       . KLONOPIN 0.5 MG tablet TAKE (1) TABLET EVERY TWELVE HOURS.  60 each  0  . metFORMIN (GLUCOPHAGE-XR) 500 MG 24 hr tablet Take 1,000 mg by mouth 2 (two) times daily.       . methadone (DOLOPHINE) 10 MG tablet Take 2 tablets (20 mg total) by mouth 3 (three) times daily.  180 tablet  0  . ondansetron (ZOFRAN) 4 MG tablet Take 1 tablet (4 mg total) by mouth every 6 (six) hours as needed for nausea.  30 tablet  0  . pantoprazole (PROTONIX)  40 MG tablet Take 1 tablet (40 mg total) by mouth 2 (two) times daily.  60 tablet  0  . SKELAXIN 800 MG tablet TAKE (1) TABLET BY MOUTH FOUR TIMES DAILY AS NEEDED.  120 tablet  0  . SUMAtriptan (IMITREX) 100 MG tablet Take 1 tablet (100 mg total) by mouth every 2 (two) hours as needed for migraine.  30 tablet  3  . TOPAMAX 100 MG tablet TAKE 1 TABLET IN THE MORNING AND 2 TABLETS AT BEDTIME.  90 each  3    Allergies as of 07/15/2012 - Review Complete 07/15/2012  Allergen Reaction Noted  . Aspirin Itching   . Milnacipran hcl Other (See Comments) 09/09/2011  . Pristiq (desvenlafaxine succinate monohydrate) Other (See Comments) 09/09/2011  . Zanaflex (tizanidine hydrochloride) Other (See Comments) 09/09/2011    Family History  Problem Relation Age of Onset  . Colon cancer Mother 28    History   Social History  . Marital Status: Married    Spouse Name: N/A    Number of Children: 3  . Years of Education: N/A   Occupational History  . disabled    Social History Main Topics  . Smoking status: Never Smoker   . Smokeless tobacco: Never Used  . Alcohol Use: No  . Drug Use: No  . Sexually Active: Not on file   Other Topics Concern  . Not on file   Social History Narrative  . No narrative on file    Review of Systems: Gen: See history of present illness CV: Denies chest pain, angina, palpitations, syncope, orthopnea, PND, peripheral edema, and claudication. Resp: Denies dyspnea at rest, dyspnea with exercise, cough, sputum, wheezing, coughing up blood, and pleurisy. GI: Denies vomiting blood, jaundice, and fecal incontinence.   GU : Denies urinary burning, blood in urine, urinary frequency, urinary hesitancy, nocturnal urination, and urinary incontinence. MS: Chronic back pain. Chronic joint pain. Sees pain clinic. Derm: Denies rash, itching, dry skin, hives, moles, warts, or unhealing ulcers.  Psych: History of anxiety, PTSD. Heme: Denies bruising, bleeding, and enlarged  lymph nodes. Neuro: Chronic headaches. Endo:  Denies any problems with DM, thyroid, adrenal function.  Physical Exam: BP 126/61  Pulse 47  Temp 97 F (36.1 C) (Temporal)  Ht 5\' 4"  (1.626 m)  Wt 154 lb 9.6 oz (70.126 kg)  BMI 26.54 kg/m2 No LMP recorded. Patient is postmenopausal. General:   Alert,  Well-developed, well-nourished, pleasant and cooperative in NAD Head:  Normocephalic and atraumatic. Eyes:  Sclera clear, no icterus.   Conjunctiva pink. Ears:  Normal auditory acuity. Nose:  No deformity, discharge,  or lesions. Mouth:  No deformity or lesions, dentition normal. Neck:  Supple; no masses or thyromegaly. Lungs:  Clear throughout to auscultation.   No wheezes, crackles, or rhonchi. No acute distress. Heart:  Regular rate and rhythm; no murmurs, clicks, rubs,  or gallops. Abdomen:  Normal bowel sounds.  Soft, nontender and nondistended. No masses, hepatosplenomegaly or hernias noted. No guarding or rebound tenderness.   Rectal:  Deferred until time of colonoscopy.   Msk:  Symmetrical without gross deformities. Normal posture. Pulses:  Normal pulses noted. Extremities:  Without clubbing or edema. Neurologic:  Alert and  oriented x4;  grossly normal neurologically. Skin:  Intact without significant lesions or rashes. Cervical Nodes:  No significant cervical adenopathy. Psych:  Alert and cooperative. Normal mood and affect.

## 2012-07-15 NOTE — Assessment & Plan Note (Addendum)
Monica [name] is a pleasant 61 y.o. female with hematochezia. Last colonoscopy was over 3 years ago and she had external hemorrhoids. Differentials include benign anorectal source, inflammatory bowel disease, colorectal polyps or carcinoma.  Diagnostic colonoscopy with Dr. Jena Gauss.  I have discussed risks & benefits which include, but are not limited to, bleeding, infection, perforation & drug reaction.  The patient agrees with this plan & written consent will be obtained.  Pt may have difficulty with conscious sedation given her multiple psychoactive medications.  I will discuss preferred approach for appropriate sedation with Dr Jena Gauss.  Take half of your AMARYL & GLUCOPHAGE  the day prior to the procedure Hold your above diabetes medications the day of the procedure Bring all medications and/or any insulin to the hospital the day of the procedure. Follow blood sugars, call us or your PCP if any problems.

## 2012-07-15 NOTE — Patient Instructions (Addendum)
Colonoscopy & EGD (upper endoscopy) with Dr Jena Gauss Take half of your AMARYL & GLUCOPHAGE  the day prior to the procedure Hold your above diabetes medications the day of the procedure Bring all your medications and/or any insulin to the hospital the day of the procedure. Follow blood sugars, call us or your PCP if any problems.

## 2012-07-15 NOTE — Assessment & Plan Note (Addendum)
She has chronic nausea in the setting of chronic GERD with erosive esophagitis and Schatzki's ring. No dysphagia at this time. Differentials include gastroparesis secondary to chronic narcotic therapy, less likely peptic ulcer disease.  EGD with Dr. Jena Gauss for further evaluation.I have discussed risks & benefits which include, but are not limited to, bleeding, infection, perforation & drug reaction.  The patient agrees with this plan & written consent will be obtained.     Continue Protonix 40 mg twice a day

## 2012-07-16 NOTE — Assessment & Plan Note (Signed)
On protonix 40mg  BID.  Heartburn controlled.  Persistent nausea.  EGD pending.

## 2012-07-16 NOTE — Progress Notes (Signed)
Faxed to PCP

## 2012-07-17 ENCOUNTER — Other Ambulatory Visit: Payer: Self-pay | Admitting: Physical Medicine & Rehabilitation

## 2012-07-20 NOTE — Progress Notes (Addendum)
Discussed sedation with Dr Jena Gauss. Pt will need EGD/colonoscopy in OR w/ propofol given hx polypharmacy. See diabetes instructions as below. Thanks

## 2012-07-21 ENCOUNTER — Other Ambulatory Visit: Payer: Self-pay | Admitting: Internal Medicine

## 2012-07-21 MED ORDER — PEG 3350-KCL-NA BICARB-NACL 420 G PO SOLR: 4000.0000 mL | ORAL | Status: DC

## 2012-07-21 NOTE — Progress Notes (Signed)
Patient is scheduled with RMR in the OR on 01/02 and I have spoke to her and mailed her instructions

## 2012-07-22 ENCOUNTER — Encounter (HOSPITAL_COMMUNITY): Payer: Self-pay | Admitting: Pharmacy Technician

## 2012-07-31 ENCOUNTER — Other Ambulatory Visit: Payer: Self-pay

## 2012-07-31 ENCOUNTER — Encounter (HOSPITAL_COMMUNITY)
Admission: RE | Admit: 2012-07-31 | Discharge: 2012-07-31 | Disposition: A | Discharge status: Home / Self Care | Payer: Medicare Other | Source: Ambulatory Visit | Attending: Internal Medicine | Admitting: Internal Medicine

## 2012-07-31 ENCOUNTER — Encounter (HOSPITAL_COMMUNITY): Payer: Self-pay

## 2012-07-31 DIAGNOSIS — K921 Melena: Secondary | ICD-10-CM | POA: Insufficient documentation

## 2012-07-31 DIAGNOSIS — Z01812 Encounter for preprocedural laboratory examination: Secondary | ICD-10-CM | POA: Insufficient documentation

## 2012-07-31 DIAGNOSIS — Z01810 Encounter for preprocedural cardiovascular examination: Secondary | ICD-10-CM | POA: Insufficient documentation

## 2012-07-31 DIAGNOSIS — Z5309 Procedure and treatment not carried out because of other contraindication: Secondary | ICD-10-CM | POA: Insufficient documentation

## 2012-07-31 HISTORY — DX: Unspecified glaucoma: H40.9

## 2012-07-31 HISTORY — DX: Unspecified osteoarthritis, unspecified site: M19.90

## 2012-07-31 LAB — BASIC METABOLIC PANEL
Calcium: 9.7 mg/dL (ref 8.4–10.5)
GFR calc Af Amer: 62 mL/min — ABNORMAL LOW (ref >90)
GFR calc non Af Amer: 54 mL/min — ABNORMAL LOW (ref >90)
Potassium: 5.2 mEq/L — ABNORMAL HIGH (ref 3.5–5.1)
Sodium: 137 mEq/L (ref 135–145)

## 2012-07-31 LAB — HEMOGLOBIN AND HEMATOCRIT, BLOOD
HCT: 40.3 % (ref 36.0–46.0)
Hemoglobin: 13.1 g/dL (ref 12.0–15.0)

## 2012-07-31 NOTE — Patient Instructions (Addendum)
20 Monica [name]  07/31/2012   Your procedure is scheduled on:  08/13/2012  Report to [facility] at  715  AM.  Call this number if you have problems the morning of surgery: [phone]   Remember:   Do not eat food:After Midnight.  May have clear liquids:until Midnight   Take these medicines the morning of surgery with A SIP OF WATER methadone,zofran,protonix,imitrex,topamax,skelaxin. No diabetic medicine the morning of your procedure.   Do not wear jewelry, make-up or nail polish.  Do not wear lotions, powders, or perfumes.   Do not shave 48 hours prior to surgery. Men may shave face and neck.  Do not bring valuables to the hospital.  Contacts, dentures or bridgework may not be worn into surgery.  Leave suitcase in the car. After surgery it may be brought to your room.  For patients admitted to the hospital, checkout time is 11:00 AM the day of discharge.   Patients discharged the day of surgery will not be allowed to drive home.  Name and phone number of your driver: family  Special Instructions: N/A   Please read over the following fact sheets that you were given: Pain Booklet, Surgical Site Infection Prevention, Anesthesia Post-op Instructions and Care and Recovery After Surgery Esophagogastroduodenoscopy This is an endoscopic procedure (a procedure that uses a device like a flexible telescope) that allows your caregiver to view the upper stomach and small bowel. This test allows your caregiver to look at the esophagus. The esophagus carries food from your mouth to your stomach. They can also look at your duodenum. This is the first part of the small intestine that attaches to the stomach. This test is used to detect problems in the bowel such as ulcers and inflammation. PREPARATION FOR TEST Nothing to eat after midnight the day before the test. NORMAL FINDINGS Normal esophagus, stomach, and duodenum. Ranges for normal findings may vary among different laboratories and  hospitals. You should always check with your doctor after having lab work or other tests done to discuss the meaning of your test results and whether your values are considered within normal limits. MEANING OF TEST  Your caregiver will go over the test results with you and discuss the importance and meaning of your results, as well as treatment options and the need for additional tests if necessary. OBTAINING THE TEST RESULTS It is your responsibility to obtain your test results. Ask the lab or department performing the test when and how you will get your results. Document Released: 11/29/2004 Document Revised: 10/21/2011 Document Reviewed: 07/08/2008 [facility] Patient Information 2013 [place], [place]. Colonoscopy A colonoscopy is an exam to evaluate your entire colon. In this exam, your colon is cleansed. A long fiberoptic tube is inserted through your rectum and into your colon. The fiberoptic scope (endoscope) is a long bundle of enclosed and very flexible fibers. These fibers transmit light to the area examined and send images from that area to your caregiver. Discomfort is usually minimal. You may be given a drug to help you sleep (sedative) during or prior to the procedure. This exam helps to detect lumps (tumors), polyps, inflammation, and areas of bleeding. Your caregiver may also take a small piece of tissue (biopsy) that will be examined under a microscope. LET YOUR CAREGIVER KNOW ABOUT:   Allergies to food or medicine.  Medicines taken, including vitamins, herbs, eyedrops, over-the-counter medicines, and creams.  Use of steroids (by mouth or creams).  Previous problems with anesthetics  or numbing medicines.  History of bleeding problems or blood clots.  Previous surgery.  Other health problems, including diabetes and kidney problems.  Possibility of pregnancy, if this applies. BEFORE THE PROCEDURE   A clear liquid diet may be required for 2 days before the exam.  Ask your  caregiver about changing or stopping your regular medications.  Liquid injections (enemas) or laxatives may be required.  A large amount of electrolyte solution may be given to you to drink over a short period of time. This solution is used to clean out your colon.  You should be present 60 minutes prior to your procedure or as directed by your caregiver. AFTER THE PROCEDURE   If you received a sedative or pain relieving medication, you will need to arrange for someone to drive you home.  Occasionally, there is a little blood passed with the first bowel movement. Do not be concerned. FINDING OUT THE RESULTS OF YOUR TEST Not all test results are available during your visit. If your test results are not back during the visit, make an appointment with your caregiver to find out the results. Do not assume everything is normal if you have not heard from your caregiver or the medical facility. It is important for you to follow up on all of your test results. HOME CARE INSTRUCTIONS   It is not unusual to pass moderate amounts of gas and experience mild abdominal cramping following the procedure. This is due to air being used to inflate your colon during the exam. Walking or a warm pack on your belly (abdomen) may help.  You may resume all normal meals and activities after sedatives and medicines have worn off.  Only take over-the-counter or prescription medicines for pain, discomfort, or fever as directed by your caregiver. Do not use aspirin or blood thinners if a biopsy was taken. Consult your caregiver for medicine usage if biopsies were taken. SEEK IMMEDIATE MEDICAL CARE IF:   You have a fever.  You pass large blood clots or fill a toilet with blood following the procedure. This may also occur 10 to 14 days following the procedure. This is more likely if a biopsy was taken.  You develop abdominal pain that keeps getting worse and cannot be relieved with medicine. Document Released:  07/26/2000 Document Revised: 10/21/2011 Document Reviewed: 03/10/2008 [facility] Patient Information 2013 [place], [place]. PATIENT INSTRUCTIONS POST-ANESTHESIA  IMMEDIATELY FOLLOWING SURGERY:  Do not drive or operate machinery for the first twenty four hours after surgery.  Do not make any important decisions for twenty four hours after surgery or while taking narcotic pain medications or sedatives.  If you develop intractable nausea and vomiting or a severe headache please notify your doctor immediately.  FOLLOW-UP:  Please make an appointment with your surgeon as instructed. You do not need to follow up with anesthesia unless specifically instructed to do so.  WOUND CARE INSTRUCTIONS (if applicable):  Keep a dry clean dressing on the anesthesia/puncture wound site if there is drainage.  Once the wound has quit draining you may leave it open to air.  Generally you should leave the bandage intact for twenty four hours unless there is drainage.  If the epidural site drains for more than 36-48 hours please call the anesthesia department.  QUESTIONS?:  Please feel free to call your physician or the hospital operator if you have any questions, and they will be happy to assist you.

## 2012-08-03 ENCOUNTER — Other Ambulatory Visit: Payer: Self-pay

## 2012-08-03 ENCOUNTER — Other Ambulatory Visit: Payer: Self-pay | Admitting: Urgent Care

## 2012-08-03 DIAGNOSIS — E875 Hyperkalemia: Secondary | ICD-10-CM

## 2012-08-03 LAB — POTASSIUM: Potassium: 4.5 mEq/L (ref 3.5–5.3)

## 2012-08-03 NOTE — Progress Notes (Signed)
Quick Note:  Spoke w/ pt. She is not on diuretics. She does eat several bananas each day. She is catching a flight to [place] at [employer] won't be around to recheck K later this week. Spoke w/ Raynelle Fanning RN. Pt advised to get Stat K today at [facility]. Avoid K rich foods & bananas. We can contact her w/ results & plan. Raynelle Fanning, Please make sure we have contact # for pt while on vacation & fax lab to [facility]. Thanks WU:JWJX,BJYNWG, MD  ______

## 2012-08-03 NOTE — Progress Notes (Signed)
Quick Note:  Monica [name]! Please call pt. Let her know potassium is normal now. OK to proceed with procedures as planned. Thanks DG:LOVF,IEPPIR, MD  ______

## 2012-08-03 NOTE — Pre-Procedure Instructions (Signed)
K+ 5.2 shown to Dr Jonne Ply orders given.

## 2012-08-10 ENCOUNTER — Telehealth: Payer: Self-pay | Admitting: Internal Medicine

## 2012-08-10 NOTE — Telephone Encounter (Signed)
Patient had to cancel her procedure on 08/13/12 she fell and has broken ribs and she will call us back to R/S as soon as she has healed and is stronger

## 2012-08-13 ENCOUNTER — Encounter (HOSPITAL_COMMUNITY): Admission: RE | Payer: Self-pay | Source: Ambulatory Visit

## 2012-08-13 ENCOUNTER — Ambulatory Visit (HOSPITAL_COMMUNITY): Admission: RE | Admit: 2012-08-13 | Payer: Medicare Other | Source: Ambulatory Visit | Admitting: Internal Medicine

## 2012-08-13 SURGERY — COLONOSCOPY WITH PROPOFOL
Anesthesia: Monitor Anesthesia Care

## 2012-08-18 ENCOUNTER — Encounter: Payer: Self-pay | Admitting: Physical Medicine & Rehabilitation

## 2012-08-18 ENCOUNTER — Encounter
Payer: Worker's Compensation | Attending: Physical Medicine & Rehabilitation | Admitting: Physical Medicine & Rehabilitation

## 2012-08-18 VITALS — BP 136/63 | HR 67 | Resp 14 | Wt 154.0 lb

## 2012-08-18 DIAGNOSIS — F329 Major depressive disorder, single episode, unspecified: Secondary | ICD-10-CM

## 2012-08-18 DIAGNOSIS — F603 Borderline personality disorder: Secondary | ICD-10-CM | POA: Insufficient documentation

## 2012-08-18 DIAGNOSIS — M47812 Spondylosis without myelopathy or radiculopathy, cervical region: Secondary | ICD-10-CM

## 2012-08-18 DIAGNOSIS — F4310 Post-traumatic stress disorder, unspecified: Secondary | ICD-10-CM | POA: Insufficient documentation

## 2012-08-18 DIAGNOSIS — G43919 Migraine, unspecified, intractable, without status migrainosus: Secondary | ICD-10-CM

## 2012-08-18 DIAGNOSIS — M129 Arthropathy, unspecified: Secondary | ICD-10-CM | POA: Insufficient documentation

## 2012-08-18 DIAGNOSIS — M542 Cervicalgia: Secondary | ICD-10-CM | POA: Insufficient documentation

## 2012-08-18 DIAGNOSIS — M5380 Other specified dorsopathies, site unspecified: Secondary | ICD-10-CM

## 2012-08-18 DIAGNOSIS — R51 Headache: Secondary | ICD-10-CM | POA: Insufficient documentation

## 2012-08-18 MED ORDER — METHADONE HCL 10 MG PO TABS: 20.0000 mg | ORAL_TABLET | Freq: Three times a day (TID) | ORAL | Status: DC

## 2012-08-18 MED ORDER — FENTANYL CITRATE 1200 MCG BU LPOP: 1200.0000 ug | Freq: Every day | BUCCAL | Status: DC | PRN

## 2012-08-18 NOTE — Progress Notes (Signed)
Subjective:    Patient ID: Monica [name], female    DOB: 1951/08/10, 62 y.o.   MRN: [id number]  HPI  Monica [name] is back regarding her chronic cervicalgia and headaches. She fell while visiting in [place] the day after Christmas. Her family doctor prescribed her hydrocodone which only exacerbated her HA.   She has not seen Dr. Zenda Alpers yet as [facility] has not approved.  Otherwise, the patient has been doing fairly well. She has tried to stay as active as she can given her headaches. She continues to enjoy embroidering and working on projects for her family.     Pain Inventory Average Pain 8 Pain Right Now 7 My pain is constant, sharp, burning, stabbing, aching and banging  In the last 24 hours, has pain interfered with the following? General activity 8 Relation with others 9 Enjoyment of life 9 What TIME of day is your pain at its worst? varies Sleep (in general) Fair  Pain is worse with: bending and some activites Pain improves with: rest, pacing activities and medication Relief from Meds: 7  Mobility Do you have any goals in this area?  no  Function Do you have any goals in this area?  no  Neuro/Psych No problems in this area  Prior Studies Any changes since last visit?  no  Physicians involved in your care Any changes since last visit?  no   Family History  Problem Relation Age of Onset  . Colon cancer Mother 65   History   Social History  . Marital Status: Married    Spouse Name: N/A    Number of Children: 3  . Years of Education: N/A   Occupational History  . disabled    Social History Main Topics  . Smoking status: Never Smoker   . Smokeless tobacco: Never Used  . Alcohol Use: No  . Drug Use: No  . Sexually Active: Yes    Birth Control/ Protection: Post-menopausal   Other Topics Concern  . None   Social History Narrative  . None   Past Surgical History  Procedure Date  . Colonoscopy 11/17/08    ext hemorrhoids  . Gastro empy study 03/03/10    normal   . Esophagogastroduodenoscopy 04/05/10    [name] (63F),erosive reflux esophagitis, small hiatal hernia,  . Cholecystectomy   . Neck surgery 1998  . Hand surgery   . Breast lumpectomy     left  . Craniotomy     secondary TBI  . Nephrolithotomy 05/13/2012    Procedure: NEPHROLITHOTOMY PERCUTANEOUS;  Surgeon: Marcine Matar, MD;  Location: [facility];  Service: Urology;  Laterality: Left;     . Finger surgery     removal cyst left pinky   Past Medical History  Diagnosis Date  . Chronic nausea     normal GES   . Hemorrhoids 11/17/08    Colonoscopy Dr Karilyn Cota  . Chronic headaches     Dr Hermelinda Medicus [place] [facility]  . PTSD (post-traumatic stress disorder)   . DM (diabetes mellitus)   . Torticollis   . Elevated liver enzymes     hx of, normal 03/2010  . Headache   . Migraine without aura, with intractable migraine, so stated, without mention of status migrainosus   . Unspecified musculoskeletal disorders and symptoms referable to neck     cervical/trapezius  . Occipital neuralgia   . Posttraumatic stress disorder   . Torticollis   . Cervicalgia   . Complication of anesthesia  had problem with local anesthesia-tried to assist Physician  . Renal lithiasis   . Glaucoma   . Seizure     12 yrs ago-med related  . Arthritis    BP 136/63  Pulse 67  Resp 14  Wt 154 lb (69.854 kg)  SpO2 100%     Review of Systems  Neurological: Positive for headaches.  All other systems reviewed and are negative.       Objective:   Physical Exam  Symmetric normal motor tone is noted throughout. Normal muscle bulk. Muscle testing reveals 5/5 muscle strength of the upper extremity, and 5/5 of the lower extremity. Full range of motion in upper and lower extremities. ROM of Cervical spine is Restricted, because of pain. Fine motor movements are normal in both hands.  Sensory is intact and symmetric to light touch, pinprick and proprioception.  DTR in the upper and lower  extremity are present and symmetric 2+. No clonus is noted.  Patient arises from chair without difficulty. Narrow based gait with normal arm swing bilateral , able to walk on heels and toes . Tandem walk is stable. No pronator drift. Rhomberg negative.  Tenderness in descending trapezius bilateral. Pain along the left middle ribs under the breast. Kyphotic posture in T spine  Psych:in good spirits today. Very calm and collected  Assessment & Plan:   Assessment:  1. History of cervicalgia, facet arthropathy, and chronic daily headaches.  2. Posttraumatic stress syndrome.  3. Borderline Personality  4. Recent rib fx's on left after fall.  PLAN:  1. I gave her a prescription for her methadone and Actiq as well as refills on imitrex.  2. My PA will see her back in about 1 month's time--follow up with me in 3 months 3. I think she has made some strides in dealing with her pain and not letting it control her while at the same time knowing her boundaries. The sewing continues to be therapeutic for her. Still awaiting visit with Dr. Zenda Alpers pending [facility] approval. 4. Recommended ice and/or heat for ribs.  5. Reviewed her CSA again. Needs to review any new or changes to her controlled substances with [place] first. She acknowledged.

## 2012-08-18 NOTE — Patient Instructions (Signed)
Apply ice and/ or heat to your ribs

## 2012-08-24 ENCOUNTER — Other Ambulatory Visit: Payer: Self-pay

## 2012-08-24 MED ORDER — PANTOPRAZOLE SODIUM 40 MG PO TBEC: 40.0000 mg | DELAYED_RELEASE_TABLET | Freq: Two times a day (BID) | ORAL | Status: DC

## 2012-08-27 ENCOUNTER — Other Ambulatory Visit: Payer: Self-pay | Admitting: Physical Medicine & Rehabilitation

## 2012-08-28 ENCOUNTER — Telehealth: Payer: Self-pay | Admitting: *Deleted

## 2012-08-28 NOTE — Telephone Encounter (Signed)
Patient needs letter from [name] stating why she needs to Dr. Zenda Alpers, who is a Veterinary surgeon. Patient needs this sent to her [name]. Fax# [phone]. Please put attention [name].

## 2012-08-31 NOTE — Telephone Encounter (Signed)
Done

## 2012-08-31 NOTE — Telephone Encounter (Signed)
Patient aware. Letter was mailed per patients request.

## 2012-09-14 ENCOUNTER — Ambulatory Visit: Payer: Self-pay | Admitting: Physical Medicine and Rehabilitation

## 2012-09-18 ENCOUNTER — Encounter
Payer: Worker's Compensation | Attending: Physical Medicine & Rehabilitation | Admitting: Physical Medicine & Rehabilitation

## 2012-09-18 ENCOUNTER — Encounter: Payer: Self-pay | Admitting: Physical Medicine & Rehabilitation

## 2012-09-18 VITALS — BP 110/66 | HR 55 | Resp 14 | Ht 64.0 in | Wt 153.0 lb

## 2012-09-18 DIAGNOSIS — M129 Arthropathy, unspecified: Secondary | ICD-10-CM | POA: Insufficient documentation

## 2012-09-18 DIAGNOSIS — M542 Cervicalgia: Secondary | ICD-10-CM | POA: Insufficient documentation

## 2012-09-18 DIAGNOSIS — F603 Borderline personality disorder: Secondary | ICD-10-CM | POA: Insufficient documentation

## 2012-09-18 DIAGNOSIS — M5380 Other specified dorsopathies, site unspecified: Secondary | ICD-10-CM

## 2012-09-18 DIAGNOSIS — G43919 Migraine, unspecified, intractable, without status migrainosus: Secondary | ICD-10-CM

## 2012-09-18 DIAGNOSIS — F4310 Post-traumatic stress disorder, unspecified: Secondary | ICD-10-CM | POA: Insufficient documentation

## 2012-09-18 DIAGNOSIS — M47812 Spondylosis without myelopathy or radiculopathy, cervical region: Secondary | ICD-10-CM

## 2012-09-18 DIAGNOSIS — R51 Headache: Secondary | ICD-10-CM | POA: Insufficient documentation

## 2012-09-18 MED ORDER — METHADONE HCL 10 MG PO TABS: 20.0000 mg | ORAL_TABLET | Freq: Three times a day (TID) | ORAL | Status: DC

## 2012-09-18 MED ORDER — FENTANYL CITRATE 1200 MCG BU LPOP: 1200.0000 ug | Freq: Every day | BUCCAL | Status: DC | PRN

## 2012-09-18 NOTE — Patient Instructions (Signed)
CALL us WITH ANY PROBLEMS OR QUESTIONS.

## 2012-09-18 NOTE — Progress Notes (Signed)
Subjective:    Patient ID: Monica [name], female    DOB: [birth year], 62 y.o.   MRN: [id number]  HPI  Bettylee is back regarding her chronic pain. She continues to have headaches but has come to terms with the fact that she will continually have them.  Her ribs are better. She still has tenderness in her right wrist but it seems to be gradually improving. She tries to lay off the hands when she can.   She is still awaiting WC approval of psychology follow up.  She has to pace herself at home because of headaches. She hasn't been able to sew as much because of a recent increase in headache intensity.  Pain Inventory Average Pain 8 Pain Right Now 9 My pain is constant, sharp, burning and stabbing  In the last 24 hours, has pain interfered with the following? General activity 9 Relation with others 8 Enjoyment of life 9 What TIME of day is your pain at its worst? all the time Sleep (in general) Poor  Pain is worse with: some activites Pain improves with: rest, heat/ice, pacing activities and medication Relief from Meds: 5  Mobility ability to climb steps?  yes do you drive?  yes  Function Do you have any goals in this area?  no  Neuro/Psych No problems in this area  Prior Studies Any changes since last visit?  no  Physicians involved in your care Any changes since last visit?  no   Family History  Problem Relation Age of Onset  . Colon cancer Mother 28   History   Social History  . Marital Status: Married    Spouse Name: N/A    Number of Children: 3  . Years of Education: N/A   Occupational History  . disabled    Social History Main Topics  . Smoking status: Never Smoker   . Smokeless tobacco: Never Used  . Alcohol Use: No  . Drug Use: No  . Sexually Active: Yes    Birth Control/ Protection: Post-menopausal   Other Topics Concern  . None   Social History Narrative  . None   Past Surgical History  Procedure Date  . Colonoscopy 11/17/08    ext  hemorrhoids  . Gastro empy study 03/03/10    normal  . Esophagogastroduodenoscopy 04/05/10    [name] (3F),erosive reflux esophagitis, small hiatal hernia,  . Cholecystectomy   . Neck surgery 1998  . Hand surgery   . Breast lumpectomy     left  . Craniotomy     secondary TBI  . Nephrolithotomy 05/13/2012    Procedure: NEPHROLITHOTOMY PERCUTANEOUS;  Surgeon: Marcine Matar, MD;  Location: [facility];  Service: Urology;  Laterality: Left;     . Finger surgery     removal cyst left pinky   Past Medical History  Diagnosis Date  . Chronic nausea     normal GES   . Hemorrhoids 11/17/08    Colonoscopy Dr Karilyn Cota  . Chronic headaches     Dr Hermelinda [facility] [place] [facility]  . PTSD (post-traumatic stress disorder)   . DM (diabetes mellitus)   . Torticollis   . Elevated liver enzymes     hx of, normal 03/2010  . Headache   . Migraine without aura, with intractable migraine, so stated, without mention of status migrainosus   . Unspecified musculoskeletal disorders and symptoms referable to neck     cervical/trapezius  . Occipital neuralgia   . Posttraumatic stress disorder   . Torticollis   .  Cervicalgia   . Complication of anesthesia     had problem with local anesthesia-tried to assist Physician  . Renal lithiasis   . Glaucoma   . Seizure     12 yrs ago-med related  . Arthritis    BP 110/66  Pulse 55  Resp 14  Ht 5\' 4"  (1.626 m)  Wt 153 lb (69.4 kg)  BMI 26.26 kg/m2  SpO2 97%     Review of Systems  Neurological: Positive for headaches.  All other systems reviewed and are negative.       Objective:   Physical Exam Symmetric normal motor tone is noted throughout. Normal muscle bulk. Muscle testing reveals 5/5 muscle strength of the upper extremity, and 5/5 of the lower extremity. Full range of motion in upper and lower extremities. ROM of Cervical spine is Restricted, because of pain. Fine motor movements are normal in both hands.  Sensory is intact and  symmetric to light touch, pinprick and proprioception.  DTR in the upper and lower extremity are present and symmetric 2+. No clonus is noted.  Patient arises from chair without difficulty.  Tandem walk is stable. No pronator drift. Rhomberg negative.  Right wrist tender along lateral aspect. No swelling. Pain along the left middle ribs under the breast.  Kyphotic posture in T spine  Psych:in good spirits today. Very calm and collected    Assessment & Plan:   Assessment:  1. History of cervicalgia, facet arthropathy, and chronic daily headaches.  2. Posttraumatic stress syndrome.  3. Borderline Personality  4. Recent rib fx's, wrist sprain  after fall.   PLAN:  1. I gave her a prescription for her methadone and Actiq.  2. Follow up with me in 2 months. Second rx's were provided 3. Still awaiting visit with Dr. Zenda Alpers pending [facility] approval. I do feel this would be quite beneficial 4. Recommended occasional wrist splint for right wrist, however this is improving.  5.15 minutes of face to face patient care time were spent during this visit. All questions were encouraged and answered.  Marland Kitchen

## 2012-10-20 ENCOUNTER — Other Ambulatory Visit: Payer: Self-pay

## 2012-10-20 MED ORDER — TOPIRAMATE 100 MG PO TABS: ORAL_TABLET | ORAL | Status: DC

## 2012-11-16 ENCOUNTER — Encounter
Payer: Worker's Compensation | Attending: Physical Medicine & Rehabilitation | Admitting: Physical Medicine & Rehabilitation

## 2012-11-16 ENCOUNTER — Encounter: Payer: Self-pay | Admitting: Physical Medicine & Rehabilitation

## 2012-11-16 VITALS — BP 137/73 | HR 54 | Resp 15 | Ht 64.0 in | Wt 152.2 lb

## 2012-11-16 DIAGNOSIS — M5380 Other specified dorsopathies, site unspecified: Secondary | ICD-10-CM | POA: Insufficient documentation

## 2012-11-16 DIAGNOSIS — F329 Major depressive disorder, single episode, unspecified: Secondary | ICD-10-CM

## 2012-11-16 DIAGNOSIS — F603 Borderline personality disorder: Secondary | ICD-10-CM

## 2012-11-16 DIAGNOSIS — F32A Depression, unspecified: Secondary | ICD-10-CM

## 2012-11-16 DIAGNOSIS — G43919 Migraine, unspecified, intractable, without status migrainosus: Secondary | ICD-10-CM

## 2012-11-16 DIAGNOSIS — M47812 Spondylosis without myelopathy or radiculopathy, cervical region: Secondary | ICD-10-CM

## 2012-11-16 MED ORDER — HYDROMORPHONE HCL 8 MG PO TABS: 8.0000 mg | ORAL_TABLET | Freq: Every day | ORAL | Status: DC | PRN

## 2012-11-16 MED ORDER — METHADONE HCL 10 MG PO TABS: 20.0000 mg | ORAL_TABLET | Freq: Three times a day (TID) | ORAL | Status: DC

## 2012-11-16 NOTE — Patient Instructions (Signed)
CALL ME WITH ANY PROBLEMS OR QUESTIONS (#[phone]).  HAVE A GOOD DAY  

## 2012-11-16 NOTE — Progress Notes (Signed)
Subjective:    Patient ID: Monica [name], female    DOB: 09/16/1950, 62 y.o.   MRN: [id number]  HPI  Monica [name] is back regarding her chronic headaches. She continues to struggle with Monica [name] regarding approval for the recommended therapies and now she is having problems with refills on her medications. She doesn't believe that the carrier will refill the actiq any longer.   She tries to work through the pain the best she can but she has her ups and downs. Her pain definitely fluctuates with the amount of physical/emotional stress put on her, and she is aware of that as well.   Pain Inventory Average Pain 7 Pain Right Now 6 My pain is constant, sharp, burning, stabbing and aching  In the last 24 hours, has pain interfered with the following? General activity 9 Relation with others 8 Enjoyment of life 9 What TIME of day is your pain at its worst? daytime Sleep (in general) Poor  Pain is worse with: bending and some activites Pain improves with: rest, heat/ice, pacing activities and medication Relief from Meds: n/a  Mobility walk without assistance ability to climb steps?  yes do you drive?  yes  Function retired  Neuro/Psych depression anxiety  Prior Studies Any changes since last visit?  no  Physicians involved in your care Any changes since last visit?  no   Family History  Problem Relation Age of Onset  . Colon cancer Mother 48   History   Social History  . Marital Status: Married    Spouse Name: N/A    Number of Children: 3  . Years of Education: N/A   Occupational History  . disabled    Social History Main Topics  . Smoking status: Never Smoker   . Smokeless tobacco: Never Used  . Alcohol Use: No  . Drug Use: No  . Sexually Active: Yes    Birth Control/ Protection: Post-menopausal   Other Topics Concern  . None   Social History Narrative  . None   Past Surgical History  Procedure Laterality Date  . Colonoscopy  11/17/08    ext hemorrhoids  .  Gastro empy study  03/03/10    normal  . Esophagogastroduodenoscopy  04/05/10    [name] (80F),erosive reflux esophagitis, small hiatal hernia,  . Cholecystectomy    . Neck surgery  1998  . Hand surgery    . Breast lumpectomy      left  . Craniotomy      secondary TBI  . Nephrolithotomy  05/13/2012    Procedure: NEPHROLITHOTOMY PERCUTANEOUS;  Surgeon: Marcine Matar, MD;  Location: [facility];  Service: Urology;  Laterality: Left;     . Finger surgery      removal cyst left pinky   Past Medical History  Diagnosis Date  . Chronic nausea     normal GES   . Hemorrhoids 11/17/08    Colonoscopy Dr Karilyn Cota  . Chronic headaches     Dr Hermelinda [facility] [place] [facility]  . PTSD (post-traumatic stress disorder)   . DM (diabetes mellitus)   . Torticollis   . Elevated liver enzymes     hx of, normal 03/2010  . Headache   . Migraine without aura, with intractable migraine, so stated, without mention of status migrainosus   . Unspecified musculoskeletal disorders and symptoms referable to neck     cervical/trapezius  . Occipital neuralgia   . Posttraumatic stress disorder   . Torticollis   . Cervicalgia   . Complication  of anesthesia     had problem with local anesthesia-tried to assist Physician  . Renal lithiasis   . Glaucoma   . Seizure     12 yrs ago-med related  . Arthritis    BP 137/73  Pulse 54  Resp 15  Ht 5\' 4"  (1.626 m)  Wt 152 lb 3.2 oz (69.037 kg)  BMI 26.11 kg/m2  SpO2 97%     Review of Systems  Psychiatric/Behavioral: Positive for dysphoric mood and agitation.  All other systems reviewed and are negative.       Objective:   Physical Exam  Symmetric normal motor tone is noted throughout. Normal muscle bulk. Muscle testing reveals 5/5 muscle strength of the upper extremity, and 5/5 of the lower extremity. Full range of motion in upper and lower extremities. ROM of Cervical spine is Restricted, because of pain. Fine motor movements are normal in both  hands.  Sensory is intact and symmetric to light touch, pinprick and proprioception.  DTR in the upper and lower extremity are present and symmetric 2+. No clonus is noted.  Patient arises from chair without difficulty. Tandem walk is stable. No pronator drift. Rhomberg negative.  Right wrist tender along lateral aspect. No swelling. Pain along the left middle ribs under the breast.  Kyphotic posture in T spine  Psych:in good spirits today. Very calm and collected except when we talked about some of the Monica [name] related issues which really raised her anxiety levels.   Assessment & Plan:   Assessment:  1. History of cervicalgia, facet arthropathy, and chronic daily headaches.  2. Posttraumatic stress syndrome.  3. Borderline Personality  4. Recent rib fx's, wrist sprain after fall.   PLAN:  1. I gave her a prescription for her methadone today. As her carrier will not cover actiq any longer, I decided to write an rx for dilaudid 8mg  to be taken qday prn for severe headache. It hopefully will provide somewhat comparable effects to the actiq.  2. Follow up with me in 1 month. 3. Still awaiting visit with Dr. Zenda [name] pending Monica [name] approval. (although I am not optimistic at this point) I do feel this would be quite beneficial  4. Continue to focus on stress relieving activities as possible---spritual, vocational, physical, social 5.25 minutes of face to face patient care time were spent during this visit. All questions were encouraged and answered.

## 2012-11-17 ENCOUNTER — Telehealth: Payer: Self-pay | Admitting: *Deleted

## 2012-11-17 NOTE — Telephone Encounter (Signed)
Josette pharm rep reviewer for [employer] comp needs last note faxed to her to explain why Lasandra was placed on hydromorphone.  Note from 11/16/12 visit faxed to [phone].

## 2012-11-30 ENCOUNTER — Other Ambulatory Visit: Payer: Self-pay | Admitting: Physical Medicine & Rehabilitation

## 2012-12-02 LAB — PULMONARY FUNCTION TEST

## 2012-12-15 ENCOUNTER — Encounter: Payer: Self-pay | Admitting: Physical Medicine & Rehabilitation

## 2012-12-15 ENCOUNTER — Encounter
Payer: Worker's Compensation | Attending: Physical Medicine & Rehabilitation | Admitting: Physical Medicine & Rehabilitation

## 2012-12-15 VITALS — BP 126/67 | HR 55 | Resp 14 | Ht 64.0 in | Wt 145.0 lb

## 2012-12-15 DIAGNOSIS — Z5181 Encounter for therapeutic drug level monitoring: Secondary | ICD-10-CM

## 2012-12-15 DIAGNOSIS — M47812 Spondylosis without myelopathy or radiculopathy, cervical region: Secondary | ICD-10-CM

## 2012-12-15 DIAGNOSIS — F329 Major depressive disorder, single episode, unspecified: Secondary | ICD-10-CM | POA: Insufficient documentation

## 2012-12-15 DIAGNOSIS — F32A Depression, unspecified: Secondary | ICD-10-CM

## 2012-12-15 DIAGNOSIS — M5380 Other specified dorsopathies, site unspecified: Secondary | ICD-10-CM | POA: Insufficient documentation

## 2012-12-15 DIAGNOSIS — Z79899 Other long term (current) drug therapy: Secondary | ICD-10-CM

## 2012-12-15 DIAGNOSIS — G43919 Migraine, unspecified, intractable, without status migrainosus: Secondary | ICD-10-CM

## 2012-12-15 DIAGNOSIS — F3289 Other specified depressive episodes: Secondary | ICD-10-CM | POA: Insufficient documentation

## 2012-12-15 DIAGNOSIS — F603 Borderline personality disorder: Secondary | ICD-10-CM

## 2012-12-15 MED ORDER — HYDROMORPHONE HCL 8 MG PO TABS: 8.0000 mg | ORAL_TABLET | Freq: Every day | ORAL | Status: DC | PRN

## 2012-12-15 MED ORDER — METHADONE HCL 10 MG PO TABS: 20.0000 mg | ORAL_TABLET | Freq: Three times a day (TID) | ORAL | Status: DC

## 2012-12-15 NOTE — Patient Instructions (Signed)
CALL ME WITH ANY PROBLEMS OR QUESTIONS (#[phone]).  HAVE A GOOD DAY  

## 2012-12-15 NOTE — Progress Notes (Signed)
Subjective:    Patient ID: Monica [name], female    DOB: 1950/12/13, 62 y.o.   MRN: [id number]  HPI  Monica [name] is back regarding her chronic headaches. We changed her over to dilaudid for her breakthrough headaches. She typically is taking it with her methadone in the afternoon. She will lay down and then after she gets up the pain has been better controlled. She has found that she is able to do more during the day including activities around the house, leisure activities, mental exercises, getting on computer/ and or I pad. Overall, she's quite pleased with the results.    Pain Inventory Average Pain 8 Pain Right Now 7 My pain is constant, sharp and burning  In the last 24 hours, has pain interfered with the following? General activity 7 Relation with others 9 Enjoyment of life 9 What TIME of day is your pain at its worst? varies Sleep (in general) Fair  Pain is worse with: bending and some activites Pain improves with: rest and pacing activities Relief from Meds: 5  Mobility Do you have any goals in this area?  no  Function Do you have any goals in this area?  no  Neuro/Psych No problems in this area  Prior Studies Any changes since last visit?  no  Physicians involved in your care Any changes since last visit?  no   Family History  Problem Relation Age of Onset  . Colon cancer Mother 52   History   Social History  . Marital Status: Married    Spouse Name: N/A    Number of Children: 3  . Years of Education: N/A   Occupational History  . disabled    Social History Main Topics  . Smoking status: Never Smoker   . Smokeless tobacco: Never Used  . Alcohol Use: No  . Drug Use: No  . Sexually Active: Yes    Birth Control/ Protection: Post-menopausal   Other Topics Concern  . None   Social History Narrative  . None   Past Surgical History  Procedure Laterality Date  . Colonoscopy  11/17/08    ext hemorrhoids  . Gastro empy study  03/03/10    normal  .  Esophagogastroduodenoscopy  04/05/10    [name] (65F),erosive reflux esophagitis, small hiatal hernia,  . Cholecystectomy    . Neck surgery  1998  . Hand surgery    . Breast lumpectomy      left  . Craniotomy      secondary TBI  . Nephrolithotomy  05/13/2012    Procedure: NEPHROLITHOTOMY PERCUTANEOUS;  Surgeon: Marcine Matar, MD;  Location: [facility];  Service: Urology;  Laterality: Left;     . Finger surgery      removal cyst left pinky   Past Medical History  Diagnosis Date  . Chronic nausea     normal GES   . Hemorrhoids 11/17/08    Colonoscopy Dr Karilyn Cota  . Chronic headaches     Dr Hermelinda [facility] [place] [facility]  . PTSD (post-traumatic stress disorder)   . DM (diabetes mellitus)   . Torticollis   . Elevated liver enzymes     hx of, normal 03/2010  . Headache   . Migraine without aura, with intractable migraine, so stated, without mention of status migrainosus   . Unspecified musculoskeletal disorders and symptoms referable to neck     cervical/trapezius  . Occipital neuralgia   . Posttraumatic stress disorder   . Torticollis   . Cervicalgia   .  Complication of anesthesia     had problem with local anesthesia-tried to assist Physician  . Renal lithiasis   . Glaucoma   . Seizure     12 yrs ago-med related  . Arthritis    BP 126/67  Pulse 55  Resp 14  Ht 5\' 4"  (1.626 m)  Wt 145 lb (65.772 kg)  BMI 24.88 kg/m2  SpO2 96%     Review of Systems  Neurological: Positive for headaches.  All other systems reviewed and are negative.       Objective:   Physical Exam Symmetric normal motor tone is noted throughout. Normal muscle bulk. Muscle testing reveals 5/5 muscle strength of the upper extremity, and 5/5 of the lower extremity. Full range of motion in upper and lower extremities. ROM of Cervical spine is Restricted, because of pain. Fine motor movements are normal in both hands.  Sensory is intact and symmetric to light touch, pinprick and  proprioception.  DTR in the upper and lower extremity are present and symmetric 2+. No clonus is noted.  Patient arises from chair without difficulty. Tandem walk is stable. No pronator drift. Rhomberg negative.   Psych:in good spirits today. Very up beat..    Assessment & Plan:   Assessment:  1. History of cervicalgia, facet arthropathy, and chronic daily headaches.  2. Posttraumatic stress syndrome.  3. Borderline Personality  4. Recent rib fx's, wrist sprain after fall.   PLAN:  1. Continue methadone rx. .Will continue with dilaudid for breakthrough headache daily. This has worked well. I gave her second scripts for next month. 2. Follow up with me in 2 month..  3. Still awaiting visit with Dr. Zenda Alpers pending [facility] approval. (although I am not optimistic at this point) I do feel this would be quite beneficial  4. Continue to focus on stress relieving activities as possible---spritual, vocational, physical, social  5.25 minutes of face to face patient care time were spent during this visit. All questions were encouraged and answered.

## 2012-12-15 NOTE — Addendum Note (Signed)
Addended by: Doreene Eland on: 12/15/2012 12:04 PM   Modules accepted: Orders

## 2012-12-31 ENCOUNTER — Telehealth: Payer: Self-pay | Admitting: *Deleted

## 2012-12-31 NOTE — Telephone Encounter (Signed)
[employer] is not wanting to pay for her medications.  Monica [name] is requesting a letter from Dr [name] to her attorney stating what medication she is taking and why she needs them.

## 2013-01-01 NOTE — Telephone Encounter (Signed)
i have already done this before.

## 2013-01-07 ENCOUNTER — Other Ambulatory Visit: Payer: Self-pay | Admitting: Physical Medicine & Rehabilitation

## 2013-01-28 ENCOUNTER — Other Ambulatory Visit: Payer: Self-pay | Admitting: Physical Medicine & Rehabilitation

## 2013-02-01 ENCOUNTER — Telehealth: Payer: Self-pay | Admitting: *Deleted

## 2013-02-01 MED ORDER — TOPIRAMATE 100 MG PO TABS: ORAL_TABLET | ORAL | Status: DC

## 2013-02-01 NOTE — Telephone Encounter (Signed)
Phone request for Topamax refill.  Refilled electronically.

## 2013-02-10 ENCOUNTER — Encounter
Payer: Worker's Compensation | Attending: Physical Medicine & Rehabilitation | Admitting: Physical Medicine & Rehabilitation

## 2013-02-10 ENCOUNTER — Encounter: Payer: Self-pay | Admitting: Pulmonary Disease

## 2013-02-10 ENCOUNTER — Ambulatory Visit (INDEPENDENT_AMBULATORY_CARE_PROVIDER_SITE_OTHER): Payer: Medicare Other | Admitting: Pulmonary Disease

## 2013-02-10 ENCOUNTER — Encounter: Payer: Self-pay | Admitting: Physical Medicine & Rehabilitation

## 2013-02-10 VITALS — BP 128/61 | HR 54 | Resp 14 | Ht 64.0 in | Wt 149.0 lb

## 2013-02-10 VITALS — BP 124/72 | HR 68 | Ht 64.0 in | Wt 150.0 lb

## 2013-02-10 DIAGNOSIS — F329 Major depressive disorder, single episode, unspecified: Secondary | ICD-10-CM

## 2013-02-10 DIAGNOSIS — M47812 Spondylosis without myelopathy or radiculopathy, cervical region: Secondary | ICD-10-CM

## 2013-02-10 DIAGNOSIS — F32A Depression, unspecified: Secondary | ICD-10-CM

## 2013-02-10 DIAGNOSIS — R0683 Snoring: Secondary | ICD-10-CM | POA: Insufficient documentation

## 2013-02-10 DIAGNOSIS — R0989 Other specified symptoms and signs involving the circulatory and respiratory systems: Secondary | ICD-10-CM

## 2013-02-10 DIAGNOSIS — R0600 Dyspnea, unspecified: Secondary | ICD-10-CM

## 2013-02-10 DIAGNOSIS — G43919 Migraine, unspecified, intractable, without status migrainosus: Secondary | ICD-10-CM

## 2013-02-10 DIAGNOSIS — M5380 Other specified dorsopathies, site unspecified: Secondary | ICD-10-CM | POA: Insufficient documentation

## 2013-02-10 MED ORDER — METHADONE HCL 10 MG PO TABS: 20.0000 mg | ORAL_TABLET | Freq: Three times a day (TID) | ORAL | Status: DC

## 2013-02-10 MED ORDER — OXCARBAZEPINE 150 MG PO TABS: 150.0000 mg | ORAL_TABLET | Freq: Two times a day (BID) | ORAL | Status: DC

## 2013-02-10 MED ORDER — HYDROMORPHONE HCL 8 MG PO TABS: 8.0000 mg | ORAL_TABLET | Freq: Every day | ORAL | Status: DC | PRN

## 2013-02-10 NOTE — Progress Notes (Deleted)
  Subjective:    Patient ID: Monica [name], female    DOB: 10/01/1950, 62 y.o.   MRN: [id number]  HPI    Review of Systems  Constitutional: Negative for fever, chills, diaphoresis, activity change, appetite change, fatigue and unexpected weight change.  HENT: Positive for congestion and trouble swallowing. Negative for hearing loss, ear pain, nosebleeds, sore throat, facial swelling, rhinorrhea, sneezing, mouth sores, neck pain, neck stiffness, dental problem, voice change, postnasal drip, sinus pressure, tinnitus and ear discharge.   Eyes: Negative for photophobia, discharge, itching and visual disturbance.  Respiratory: Positive for shortness of breath. Negative for apnea, cough, choking, chest tightness, wheezing and stridor.   Cardiovascular: Negative for chest pain, palpitations and leg swelling.  Gastrointestinal: Negative for nausea, vomiting, abdominal pain, constipation, blood in stool and abdominal distention.  Genitourinary: Negative for dysuria, urgency, frequency, hematuria, flank pain, decreased urine volume and difficulty urinating.  Musculoskeletal: Negative for myalgias, back pain, joint swelling, arthralgias and gait problem.  Skin: Negative for color change, pallor and rash.  Neurological: Positive for headaches. Negative for dizziness, tremors, seizures, syncope, speech difficulty, weakness, light-headedness and numbness.  Hematological: Negative for adenopathy. Does not bruise/bleed easily.  Psychiatric/Behavioral: Negative for confusion, sleep disturbance and agitation. The patient is nervous/anxious.        Objective:   Physical Exam        Assessment & Plan:

## 2013-02-10 NOTE — Assessment & Plan Note (Signed)
She reports snoring, sleep disruption, and witnessed apnea.  She is on several narcotic medications.  She has a history diabetes.  It is possible that she could have sleep disordered breathing (obstructive sleep apnea, central sleep apnea, or sleep related hypoventilation).  She could also be having episodes of micro-sleep during the day causing her episodes of daytime dyspnea.  To further assess will arrange for in lab sleep study.

## 2013-02-10 NOTE — Patient Instructions (Signed)
CALL ME WITH ANY PROBLEMS OR QUESTIONS (#[phone]).  HAVE A GOOD DAY  

## 2013-02-10 NOTE — Patient Instructions (Signed)
Will get copy of breathing test and blood work from Dr. Margaretmary Eddy office Will arrange for sleep study Will call to schedule follow up after sleep study reviewed

## 2013-02-10 NOTE — Progress Notes (Signed)
Subjective:    Patient ID: Monica [name], female    DOB: 11-21-1950, 62 y.o.   MRN: [id number]  HPI  Monica [name] is back regarding her chronic pain issues. She continues to work through her pain as tolerated. She has been running into obstacles with her carrier regarding her medications which she has had to call and receive "authorization" for. She has been on the phone constantly with the pharmacy, WC carrier, etc. Her headaches are often increased because of the stress this is putting her under.   Her headaches continue to mos prominent in her neck, posterior/temporal/parietal head areas. She doesn't feel that the 8mg  of dilaudid is helping as much as it used to.    Pain Inventory Average Pain 7 Pain Right Now 8 My pain is constant, sharp, burning, stabbing, tingling and aching  In the last 24 hours, has pain interfered with the following? General activity 7 Relation with others 9 Enjoyment of life 9 What TIME of day is your pain at its worst? daytime Sleep (in general) Poor  Pain is worse with: bending and some activites Pain improves with: rest, heat/ice, pacing activities and medication Relief from Meds: 4  Mobility do you drive?  yes  Function not employed: date last employed .  Neuro/Psych No problems in this area  Prior Studies Any changes since last visit?  no  Physicians involved in your care Any changes since last visit?  no   Family History  Problem Relation Age of Onset  . Colon cancer Mother 28   History   Social History  . Marital Status: Married    Spouse Name: N/A    Number of Children: 3  . Years of Education: N/A   Occupational History  . disabled    Social History Main Topics  . Smoking status: Never Smoker   . Smokeless tobacco: Never Used  . Alcohol Use: No  . Drug Use: No  . Sexually Active: Yes    Birth Control/ Protection: Post-menopausal   Other Topics Concern  . None   Social History Narrative  . None   Past Surgical  History  Procedure Laterality Date  . Colonoscopy  11/17/08    ext hemorrhoids  . Gastro empy study  03/03/10    normal  . Esophagogastroduodenoscopy  04/05/10    [name] (71F),erosive reflux esophagitis, small hiatal hernia,  . Cholecystectomy    . Neck surgery  1998  . Hand surgery    . Breast lumpectomy      left  . Craniotomy      secondary TBI  . Nephrolithotomy  05/13/2012    Procedure: NEPHROLITHOTOMY PERCUTANEOUS;  Surgeon: Marcine Matar, MD;  Location: [facility];  Service: Urology;  Laterality: Left;     . Finger surgery      removal cyst left pinky   Past Medical History  Diagnosis Date  . Chronic nausea     normal GES   . Hemorrhoids 11/17/08    Colonoscopy Dr Karilyn Cota  . Chronic headaches     Dr Hermelinda [facility] [place] [facility]  . PTSD (post-traumatic stress disorder)   . DM (diabetes mellitus)   . Torticollis   . Elevated liver enzymes     hx of, normal 03/2010  . Headache(784.0)   . Migraine without aura, with intractable migraine, so stated, without mention of status migrainosus   . Unspecified musculoskeletal disorders and symptoms referable to neck     cervical/trapezius  . Occipital neuralgia   . Posttraumatic  stress disorder   . Torticollis   . Cervicalgia   . Complication of anesthesia     had problem with local anesthesia-tried to assist Physician  . Renal lithiasis   . Glaucoma   . Seizure     12 yrs ago-med related  . Arthritis    BP 128/61  Pulse 54  Resp 14  Ht 5\' 4"  (1.626 m)  Wt 149 lb (67.586 kg)  BMI 25.56 kg/m2  SpO2 98%    Review of Systems  Neurological: Positive for headaches.  All other systems reviewed and are negative.       Objective:   Physical Exam    normal motor tone is noted in both LE's . Muscle testing reveals 5/5 muscle strength of the upper extremity, and 5/5 of the lower extremity. Full range of motion in upper and lower extremities. ROM of Cervical spine is Restricted, because of pain. Fine motor  movements are normal in both hands.  Sensory is intact and symmetric to light touch, pinprick and proprioception.  DTR in the upper and lower extremity are present and symmetric 2+. No clonus is noted.  Patient arises from chair without difficulty. Tandem walk is stable. No pronator drift. Rhomberg negative.  Psych:in good spirits today. Very up beat..  Assessment & Plan:   Assessment:  1. History of cervicalgia, facet arthropathy, and chronic daily, intractable migraine \ headaches.  2. Posttraumatic stress syndrome.  3. Borderline Personality       PLAN:  1. Continue methadone rx. .Will continue with dilaudid for breakthrough headache daily.I will allow her to use 12mg  on "bad" days or when she has increased activity planned. We discussed the concern over escalating the dose of her dilaudid.  I gave her second scripts for next month.  2. Will give her a trial of trileptal for headache prophylaxis, 150mg  bid. 3. Neuropsychology input, again, would be helpful  4. Continue to focus on stress relieving activities as possible---spritual, vocational, physical, social  5. 25 minutes of face to face patient care time were spent during this visit. All questions were encouraged and answered.

## 2013-02-10 NOTE — Assessment & Plan Note (Signed)
She reports intermittent episodes of dyspnea.  This apparently has been present for years, but more noticeable over the past one year.  Her recent chest xray was normal.  She had recent breathing test and lab work, and these are reported to be normal.  It is unclear at this time what is the cause of her breathing problems.  I will get copies of her lab work and PFT.  Depending on results will determine if she needs additional testing.
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
## 2013-02-11 ENCOUNTER — Telehealth: Payer: Self-pay

## 2013-02-11 NOTE — Telephone Encounter (Signed)
Heather at [facility] called saying that patient needs a letter of medical necessity for her trileptal per workers comp.  They are aware Dr [name] and Clydie Braun are out of the office.  Spoke with workers comp.  They say if we fax them a letter stating provider is out of the office they may take that into consideration and get medical necessity letter later.  Per note Will give her a trial of trileptal for headache prophylaxis.    Letter faxed to [phone].

## 2013-02-15 ENCOUNTER — Ambulatory Visit: Payer: Self-pay | Admitting: Physical Medicine & Rehabilitation

## 2013-02-15 ENCOUNTER — Other Ambulatory Visit: Payer: Self-pay | Admitting: Gastroenterology

## 2013-02-16 ENCOUNTER — Encounter: Payer: Self-pay | Admitting: Pulmonary Disease

## 2013-03-04 ENCOUNTER — Encounter (HOSPITAL_BASED_OUTPATIENT_CLINIC_OR_DEPARTMENT_OTHER): Payer: Self-pay

## 2013-03-14 ENCOUNTER — Encounter (HOSPITAL_BASED_OUTPATIENT_CLINIC_OR_DEPARTMENT_OTHER): Payer: Self-pay

## 2013-03-15 ENCOUNTER — Telehealth: Payer: Self-pay

## 2013-03-15 NOTE — Telephone Encounter (Signed)
She is having back pain.  Her headaches are ok.

## 2013-03-15 NOTE — Telephone Encounter (Signed)
She needs to be seen for an assessment of her back then. i believe karen could see her for that

## 2013-03-15 NOTE — Telephone Encounter (Signed)
Patient offered an appointment with Clydie Braun but she refused.  She has been using ice and it has helped.  She has also used ibuprofen and has been able to move better.  She will keep doing this.

## 2013-03-15 NOTE — Telephone Encounter (Signed)
We initiated a trial of trileptal for headache prophylaxis. Perhaps this has contributed in some way. In addition, i gave her more dilaudid at her last visit. i don't have anything more to offer her at this time

## 2013-03-15 NOTE — Telephone Encounter (Signed)
Patient was having a lot of pain.  She went to her family provider.  She informed them that she is under contract.  Her provider gave her prednisone and a shot.  Patient says this has not helped.  Patient refuses PA appointment.  Patient would like to know what else she can do?

## 2013-04-07 ENCOUNTER — Ambulatory Visit (HOSPITAL_BASED_OUTPATIENT_CLINIC_OR_DEPARTMENT_OTHER): Payer: Medicare Other | Attending: Pulmonary Disease | Admitting: Radiology

## 2013-04-07 VITALS — Ht 64.0 in | Wt 148.0 lb

## 2013-04-07 DIAGNOSIS — R0683 Snoring: Secondary | ICD-10-CM

## 2013-04-07 DIAGNOSIS — G8929 Other chronic pain: Secondary | ICD-10-CM | POA: Insufficient documentation

## 2013-04-07 DIAGNOSIS — G4710 Hypersomnia, unspecified: Secondary | ICD-10-CM | POA: Insufficient documentation

## 2013-04-07 DIAGNOSIS — E119 Type 2 diabetes mellitus without complications: Secondary | ICD-10-CM | POA: Insufficient documentation

## 2013-04-08 ENCOUNTER — Other Ambulatory Visit: Payer: Self-pay | Admitting: Physical Medicine & Rehabilitation

## 2013-04-09 ENCOUNTER — Other Ambulatory Visit: Payer: Self-pay

## 2013-04-14 ENCOUNTER — Encounter: Payer: Worker's Compensation | Admitting: Physical Medicine & Rehabilitation

## 2013-04-15 DIAGNOSIS — G4710 Hypersomnia, unspecified: Secondary | ICD-10-CM

## 2013-04-15 NOTE — Procedures (Signed)
NAMESHANEQUE, MERKLE NO.:  [id number]  MEDICAL RECORD NO.:  [id number]          PATIENT TYPE:  OUT  LOCATION:  SLEEP CENTER                 FACILITY:  [facility]  PHYSICIAN:  Coralyn Helling, MD        DATE OF BIRTH:  Dec 21, 1950  DATE OF STUDY:  04/07/2013                           NOCTURNAL POLYSOMNOGRAM  REFERRING PHYSICIAN:  Coralyn Helling, MD  INDICATION FOR STUDY:  This patient is a 62 year old female who has a history of diabetes and chronic pain.  She does report snoring sleep disruption, and daytime sleepiness.  She is referred to sleep lab for evaluation of hypersomnia with obstructive sleep apnea.  Height is 5 feet 4 inches, weight is 140 pounds.  BMI is 25.  Neck size is 14 inches.  EPWORTH SLEEPINESS SCORE:  18.  MEDICATIONS:  Reviewed in chart.  SLEEP ARCHITECTURE:  Total recording time was 444 minutes, total sleep time was 363 minutes.  Sleep efficiency was 81%.  Sleep latency was 4.5 minutes.  REM latency was 83 minutes.  The patient was observed in all stages of sleep and she slept in the non-supine position.  Of note, she slept elevated on four pillows.  RESPIRATORY DATA:  The average respiratory rate was 14.  Occasional snoring was noted by the technician.  The overall apnea/hypopnea index was 1.  There was 1 central apneic event.  The remainder of the events were obstructive in nature.  She did have truncation of her airflow suggestive of upper airway resistance syndrome.  OXYGEN DATA:  The patient had the baseline oxygenation was 94%.  The oxygen saturation nadir was 90%.  The study was conducted without the use of supplemental oxygen.  CARDIAC DATA:  The average heart rate was 48 and the rhythm strip showed sinus rhythm with sinus bradycardia and occasional PACs.  MOVEMENT-PARASOMNIA:  The periodic limb movement index was 0 and the patient had no restroom trips.  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  However, she did not have evidence to meet the diagnostic criteria for obstructive sleep apnea.  Of note, she slept with her head elevated on four pillows.  Therefore the severity of her sleep-disordered breathing could be underestimated due to the lack of supine sleep.  If the patient continues to have difficulties with daytime hypersomnolence, then consideration could be given to having her undergo a repeat sleep study with her sleeping in the supine position to better assess whether she does in fact have the presence of sleep-disordered breathing.     Coralyn Helling, MD Diplomat, American Board of Sleep Medicine    VS/MEDQ  D:  04/15/2013 11:18:16  T:  04/15/2013 22:56:54  Job:  [id number]

## 2013-04-19 ENCOUNTER — Encounter: Payer: Self-pay | Admitting: Physical Medicine & Rehabilitation

## 2013-04-19 ENCOUNTER — Telehealth: Payer: Self-pay | Admitting: Pulmonary Disease

## 2013-04-19 ENCOUNTER — Encounter
Payer: Worker's Compensation | Attending: Physical Medicine & Rehabilitation | Admitting: Physical Medicine & Rehabilitation

## 2013-04-19 VITALS — BP 171/74 | HR 52 | Resp 14 | Ht 64.0 in | Wt 149.0 lb

## 2013-04-19 DIAGNOSIS — F3289 Other specified depressive episodes: Secondary | ICD-10-CM

## 2013-04-19 DIAGNOSIS — M7918 Myalgia, other site: Secondary | ICD-10-CM | POA: Insufficient documentation

## 2013-04-19 DIAGNOSIS — R110 Nausea: Secondary | ICD-10-CM

## 2013-04-19 DIAGNOSIS — M47812 Spondylosis without myelopathy or radiculopathy, cervical region: Secondary | ICD-10-CM

## 2013-04-19 DIAGNOSIS — M5380 Other specified dorsopathies, site unspecified: Secondary | ICD-10-CM

## 2013-04-19 DIAGNOSIS — F329 Major depressive disorder, single episode, unspecified: Secondary | ICD-10-CM

## 2013-04-19 DIAGNOSIS — G43919 Migraine, unspecified, intractable, without status migrainosus: Secondary | ICD-10-CM

## 2013-04-19 DIAGNOSIS — IMO0001 Reserved for inherently not codable concepts without codable children: Secondary | ICD-10-CM

## 2013-04-19 MED ORDER — METHADONE HCL 10 MG PO TABS: 20.0000 mg | ORAL_TABLET | Freq: Three times a day (TID) | ORAL | Status: DC

## 2013-04-19 MED ORDER — OXCARBAZEPINE 300 MG PO TABS: 300.0000 mg | ORAL_TABLET | Freq: Two times a day (BID) | ORAL | Status: DC

## 2013-04-19 MED ORDER — HYDROMORPHONE HCL 8 MG PO TABS: 8.0000 mg | ORAL_TABLET | Freq: Every day | ORAL | Status: DC | PRN

## 2013-04-19 NOTE — Telephone Encounter (Signed)
Pt has been scheduled for 05/26/13 @ 4:30pm.

## 2013-04-19 NOTE — Patient Instructions (Signed)
CALL ME WITH ANY PROBLEMS OR QUESTIONS (#[phone]).  HAVE A GOOD DAY  

## 2013-04-19 NOTE — Telephone Encounter (Signed)
PSG 04/07/13 >> AHI 1, SpO2 90%, UARS, slept on 4 pillows.  Will have my nurse schedule ROV to review results.

## 2013-04-19 NOTE — Progress Notes (Signed)
Subjective:    Patient ID: Monica [name], female    DOB: [birth year], 62 y.o.   MRN: [id number]  HPI  Toye is back regarding her chronic headaches and cervicalgia.   Early last month she developed pain in her low back which resolved with rest, stretches and ice.   From a standpoint of her headaches. They have remained fairly steady. Her headaches remain steady and are affected by physical and psychosocial stress as well as the weather. She hasn't had any issues with the trileptal but hasn't noticed a difference with it either.   She developed her right shoulder pain after carrying a bag while traveling. It won't improve despite ice, rest, stretching.     Pain Inventory Average Pain 8 Pain Right Now 10 My pain is constant, sharp, burning, stabbing and aching  In the last 24 hours, has pain interfered with the following? General activity 8 Relation with others 9 Enjoyment of life 9 What TIME of day is your pain at its worst? daytime Sleep (in general) Fair  Pain is worse with: bending and some activites Pain improves with: rest, heat/ice and pacing activities Relief from Meds: 6  Mobility Do you have any goals in this area?  no  Function Do you have any goals in this area?  no  Neuro/Psych No problems in this area  Prior Studies sleep study  Physicians involved in your care pulmonary   Family History  Problem Relation Age of Onset  . Colon cancer Mother 67   History   Social History  . Marital Status: Married    Spouse Name: N/A    Number of Children: 3  . Years of Education: N/A   Occupational History  . disabled    Social History Main Topics  . Smoking status: Never Smoker   . Smokeless tobacco: Never Used  . Alcohol Use: No  . Drug Use: No  . Sexual Activity: Yes    Birth Control/ Protection: Post-menopausal   Other Topics Concern  . None   Social History Narrative  . None   Past Surgical History  Procedure Laterality Date  .  Colonoscopy  11/17/08    ext hemorrhoids  . Gastro empy study  03/03/10    normal  . Esophagogastroduodenoscopy  04/05/10    [name] (85F),erosive reflux esophagitis, small hiatal hernia,  . Cholecystectomy    . Neck surgery  1998  . Hand surgery    . Breast lumpectomy      left  . Craniotomy      secondary TBI  . Nephrolithotomy  05/13/2012    Procedure: NEPHROLITHOTOMY PERCUTANEOUS;  Surgeon: Marcine Matar, MD;  Location: [facility];  Service: Urology;  Laterality: Left;     . Finger surgery      removal cyst left pinky   Past Medical History  Diagnosis Date  . Chronic nausea     normal GES   . Hemorrhoids 11/17/08    Colonoscopy Dr Karilyn Cota  . Chronic headaches     Dr Hermelinda Medicus [place] [facility]  . PTSD (post-traumatic stress disorder)   . DM (diabetes mellitus)   . Torticollis   . Elevated liver enzymes     hx of, normal 03/2010  . Headache(784.0)   . Migraine without aura, with intractable migraine, so stated, without mention of status migrainosus   . Unspecified musculoskeletal disorders and symptoms referable to neck     cervical/trapezius  . Occipital neuralgia   . Posttraumatic stress disorder   .  Torticollis   . Cervicalgia   . Complication of anesthesia     had problem with local anesthesia-tried to assist Physician  . Renal lithiasis   . Glaucoma   . Seizure     12 yrs ago-med related  . Arthritis    BP 171/74  Pulse 52  Resp 14  Ht 5\' 4"  (1.626 m)  Wt 149 lb (67.586 kg)  BMI 25.56 kg/m2  SpO2 98%     Review of Systems  Neurological: Positive for headaches.  All other systems reviewed and are negative.       Objective:   Physical Exam  normal motor tone is noted in both LE's . Muscle testing reveals 5/5 muscle strength of the upper extremity, and 5/5 of the lower extremity. Full range of motion in upper and lower extremities. ROM of Cervical spine is Restricted, because of pain. Fine motor movements are normal in both hands.   Sensory is intact and symmetric to light touch, pinprick and proprioception.  DTR in the upper and lower extremity are present and symmetric 2+. No clonus is noted.  Patient arises from chair without difficulty. Tandem walk is stable. No pronator drift. Rhomberg negative. Right mid trap tender to palpation with palpable trigger point.  Psych:in good spirits today. Very up beat.  Assessment & Plan:   Assessment:  1. History of cervicalgia, facet arthropathy, and chronic daily, intractable migraine \ headaches.  2. Posttraumatic stress syndrome.  3. Borderline Personality  4. Recent back strain---resolved    PLAN:  1. Continue methadone rx. .Will continue with dilaudid for breakthrough headache daily.I will allow her to use 12mg  on "bad" days or when she has increased activity planned. We discussed the concern over escalating the dose of her dilaudid. She'll pick up her rx'es for next month. 2. Continue trileptal for headaches---titrate to 300mg  bid.  3. Neuropsychology input, again, would be helpful  4. Continue to focus on stress relieving activities as possible---spritual, vocational, physical, social  5. After informed consent and preparation of the skin with isopropyl alcohol, I injected the right trap with 2cc of 1% lidocaine. The patient tolerated well, and no complications were experienced. Post-injection instructions were provided. She had immediate relief of her pain after the injection. 25 minutes of face to face patient care time were spent during this visit. All questions were encouraged and answered.

## 2013-05-03 ENCOUNTER — Other Ambulatory Visit: Payer: Self-pay | Admitting: Physical Medicine & Rehabilitation

## 2013-05-10 ENCOUNTER — Telehealth: Payer: Self-pay

## 2013-05-10 NOTE — Telephone Encounter (Signed)
Spoke with patient regarding her pain.  Appointment has been made.

## 2013-05-10 NOTE — Telephone Encounter (Signed)
Patient said she had a trigger point injection on 9/8 in her shoulder. The pain is now worse than before and wants to know what she should do? If she should make a appt. Here or with here PCP?

## 2013-05-11 ENCOUNTER — Encounter: Payer: Self-pay | Admitting: Physical Medicine & Rehabilitation

## 2013-05-11 ENCOUNTER — Encounter (HOSPITAL_BASED_OUTPATIENT_CLINIC_OR_DEPARTMENT_OTHER): Payer: Worker's Compensation | Admitting: Physical Medicine & Rehabilitation

## 2013-05-11 VITALS — BP 130/59 | HR 58 | Resp 14 | Ht 64.0 in | Wt 149.0 lb

## 2013-05-11 DIAGNOSIS — M47812 Spondylosis without myelopathy or radiculopathy, cervical region: Secondary | ICD-10-CM

## 2013-05-11 DIAGNOSIS — F603 Borderline personality disorder: Secondary | ICD-10-CM

## 2013-05-11 DIAGNOSIS — IMO0001 Reserved for inherently not codable concepts without codable children: Secondary | ICD-10-CM

## 2013-05-11 DIAGNOSIS — M5380 Other specified dorsopathies, site unspecified: Secondary | ICD-10-CM

## 2013-05-11 DIAGNOSIS — M7918 Myalgia, other site: Secondary | ICD-10-CM

## 2013-05-11 DIAGNOSIS — F329 Major depressive disorder, single episode, unspecified: Secondary | ICD-10-CM

## 2013-05-11 DIAGNOSIS — G43919 Migraine, unspecified, intractable, without status migrainosus: Secondary | ICD-10-CM

## 2013-05-11 MED ORDER — METHADONE HCL 10 MG PO TABS: 20.0000 mg | ORAL_TABLET | Freq: Three times a day (TID) | ORAL | Status: DC

## 2013-05-11 MED ORDER — HYDROMORPHONE HCL 8 MG PO TABS: 8.0000 mg | ORAL_TABLET | Freq: Every day | ORAL | Status: DC | PRN

## 2013-05-11 NOTE — Patient Instructions (Signed)
CALL ME WITH ANY PROBLEMS OR QUESTIONS (#[phone]).  HAVE A GOOD DAY  

## 2013-05-11 NOTE — Progress Notes (Signed)
Subjective:    Patient ID: Monica [name], female    DOB: 01-04-1951, 62 y.o.   MRN: [id number]  HPI  Mrs. Nilsen is back regarding her chronic pain. She had immediate relief with the TPI in her right trap but it only lasted for a day. Since then the pain seems worse. She has tried stretching, ROM, and to keep up with her activities. The pain in radiates from the shoulder to her hand and to her right neck. She also is reporting tingling in her hand/fingers. She's having problems finding any comfortable conditions.    Pain Inventory Average Pain 10 Pain Right Now 10 My pain is sharp, burning, stabbing and aching  In the last 24 hours, has pain interfered with the following? General activity 8 Relation with others 9 Enjoyment of life 9 What TIME of day is your pain at its worst? daytime Sleep (in general) Fair  Pain is worse with: bending and some activites Pain improves with: rest, heat/ice and pacing activities Relief from Meds: 6  Mobility walk without assistance Do you have any goals in this area?  no  Function Do you have any goals in this area?  no  Neuro/Psych No problems in this area  Prior Studies Any changes since last visit?  no  Physicians involved in your care Any changes since last visit?  no   Family History  Problem Relation Age of Onset  . Colon cancer Mother 81   History   Social History  . Marital Status: Married    Spouse Name: N/A    Number of Children: 3  . Years of Education: N/A   Occupational History  . disabled    Social History Main Topics  . Smoking status: Never Smoker   . Smokeless tobacco: Never Used  . Alcohol Use: No  . Drug Use: No  . Sexual Activity: Yes    Birth Control/ Protection: Post-menopausal   Other Topics Concern  . None   Social History Narrative  . None   Past Surgical History  Procedure Laterality Date  . Colonoscopy  11/17/08    ext hemorrhoids  . Gastro empy study  03/03/10    normal  .  Esophagogastroduodenoscopy  04/05/10    [name] (38F),erosive reflux esophagitis, small hiatal hernia,  . Cholecystectomy    . Neck surgery  1998  . Hand surgery    . Breast lumpectomy      left  . Craniotomy      secondary TBI  . Nephrolithotomy  05/13/2012    Procedure: NEPHROLITHOTOMY PERCUTANEOUS;  Surgeon: Marcine Matar, MD;  Location: [facility];  Service: Urology;  Laterality: Left;     . Finger surgery      removal cyst left pinky   Past Medical History  Diagnosis Date  . Chronic nausea     normal GES   . Hemorrhoids 11/17/08    Colonoscopy Dr Karilyn Cota  . Chronic headaches     Dr Hermelinda Medicus [place] [facility]  . PTSD (post-traumatic stress disorder)   . DM (diabetes mellitus)   . Torticollis   . Elevated liver enzymes     hx of, normal 03/2010  . Headache(784.0)   . Migraine without aura, with intractable migraine, so stated, without mention of status migrainosus   . Unspecified musculoskeletal disorders and symptoms referable to neck     cervical/trapezius  . Occipital neuralgia   . Posttraumatic stress disorder   . Torticollis   . Cervicalgia   .  Complication of anesthesia     had problem with local anesthesia-tried to assist Physician  . Renal lithiasis   . Glaucoma   . Seizure     12 yrs ago-med related  . Arthritis    BP 130/59  Pulse 58  Resp 14  Ht 5\' 4"  (1.626 m)  Wt 149 lb (67.586 kg)  BMI 25.56 kg/m2  SpO2 97%    Review of Systems  All other systems reviewed and are negative.       Objective:   Physical Exam  Physical Exam  normal motor tone is noted in both LE's . Muscle testing reveals 5/5 muscle strength of the upper extremity, and 5/5 of the lower extremity. Full range of motion in upper and lower extremities. ROM of Cervical spine is Restricted, because of pain. Fine motor movements are normal in both hands.  Sensory is intact and symmetric to light touch, pinprick and proprioception.  DTR in the upper and lower extremity  are present and symmetric 2+. No clonus is noted.  Patient arises from chair without difficulty. Tandem walk is stable. No pronator drift. Rhomberg negative. Right mid trap, suprapinatus tender to palpation with palpable trigger point.  Psych:in good spirits today. Very up beat.    Assessment & Plan:   Assessment:  1. History of cervicalgia, facet arthropathy, and chronic daily, intractable migraine \ headaches.  2. Posttraumatic stress syndrome.  3. Borderline Personality  4. Myofascial pain right trap, supraspinatus   PLAN:  1. Continue methadone rx. .Will continue with dilaudid for breakthrough headache daily.I will allow her to use 12mg  on "bad" days or when she has increased activity planned. We discussed the concern over escalating the dose of her dilaudid. .  2. Continue trileptal for headaches--- 300mg  bid.  3. Neuropsychology input, again, would be helpful  4. Continue to focus on stress relieving activities as possible---spritual, vocational, physical, social  5. After informed consent and preparation of the skin with isopropyl alcohol, I injected the right trap and suprapinatus with 2cc of 1% lidocaine. The patient tolerated well, and no complications were experienced. Post-injection instructions were provided. She had immediate relief of her pain after the injection. Will consider formal PT for the myofascial pain if it continues to be problematic 6. 15 minutes of face to face patient care time were spent during this visit. All questions were encouraged and answered. I'll see her back next month

## 2013-05-21 ENCOUNTER — Telehealth: Payer: Self-pay

## 2013-05-21 NOTE — Telephone Encounter (Signed)
Left message on voicemail as instructed by her voicemail.

## 2013-05-21 NOTE — Telephone Encounter (Signed)
Stretching, ice, massage. Can perform another TPI if she would like.

## 2013-05-21 NOTE — Telephone Encounter (Signed)
Patient called and stated she is having a flare up with her shoulder pain. She is having a "shooting pain" She is asking for recommendations of what to do to help with the flare up!

## 2013-05-22 ENCOUNTER — Other Ambulatory Visit: Payer: Self-pay | Admitting: Physical Medicine & Rehabilitation

## 2013-05-26 ENCOUNTER — Ambulatory Visit: Payer: Self-pay | Admitting: Pulmonary Disease

## 2013-06-08 ENCOUNTER — Encounter: Payer: Self-pay | Admitting: Physical Medicine & Rehabilitation

## 2013-06-08 ENCOUNTER — Encounter
Payer: Worker's Compensation | Attending: Physical Medicine & Rehabilitation | Admitting: Physical Medicine & Rehabilitation

## 2013-06-08 VITALS — BP 165/65 | HR 58 | Resp 14 | Ht 64.0 in | Wt 151.8 lb

## 2013-06-08 DIAGNOSIS — M5380 Other specified dorsopathies, site unspecified: Secondary | ICD-10-CM | POA: Insufficient documentation

## 2013-06-08 DIAGNOSIS — G43919 Migraine, unspecified, intractable, without status migrainosus: Secondary | ICD-10-CM

## 2013-06-08 DIAGNOSIS — IMO0001 Reserved for inherently not codable concepts without codable children: Secondary | ICD-10-CM | POA: Insufficient documentation

## 2013-06-08 DIAGNOSIS — M47812 Spondylosis without myelopathy or radiculopathy, cervical region: Secondary | ICD-10-CM

## 2013-06-08 DIAGNOSIS — M7918 Myalgia, other site: Secondary | ICD-10-CM

## 2013-06-08 MED ORDER — HYDROMORPHONE HCL 8 MG PO TABS: 8.0000 mg | ORAL_TABLET | Freq: Every day | ORAL | Status: DC | PRN

## 2013-06-08 MED ORDER — METHADONE HCL 10 MG PO TABS: 20.0000 mg | ORAL_TABLET | Freq: Three times a day (TID) | ORAL | Status: DC

## 2013-06-08 MED ORDER — NAPROXEN 500 MG PO TABS: 500.0000 mg | ORAL_TABLET | Freq: Two times a day (BID) | ORAL | Status: DC

## 2013-06-08 NOTE — Progress Notes (Signed)
Subjective:    Patient ID: Monica [name], female    DOB: [birth year], 62 y.o.   MRN: [id number]  HPI  Monica [name] is back regarding her chronic pain. She had substantial bruising after we last injected her right trap/supraspinatus last month.  She saw her family doctor who injected her shoulder yesterday which did not prove to be overly beneficial. He had mentioned to her about a potential orthopedic evaluation. Furthermore, she is reporting tingling and numbness which comes and goes down her right arm. She denies those symptoms at present but noted that they were presenting while she was doing some packing/moving at home. She feels that she has to sit off to her left side with her trunk rotated to alleviate the shoulder pain while she sits.   Her headaches have been affected adversely by the shoullder pain as well.    Pain Inventory Average Pain 8 Pain Right Now 10 My pain is constant  In the last 24 hours, has pain interfered with the following? General activity 10 Relation with others 10 Enjoyment of life 10 What TIME of day is your pain at its worst? daytime Sleep (in general) Poor  Pain is worse with: bending and some activites Pain improves with: rest, heat/ice and medication Relief from Meds: 6  Mobility walk without assistance  Function disabled: date disabled .  Neuro/Psych No problems in this area  Prior Studies Any changes since last visit?  no  Physicians involved in your care Any changes since last visit?  no   Family History  Problem Relation Age of Onset  . Colon cancer Mother 50   History   Social History  . Marital Status: Married    Spouse Name: N/A    Number of Children: 3  . Years of Education: N/A   Occupational History  . disabled    Social History Main Topics  . Smoking status: Never Smoker   . Smokeless tobacco: Never Used  . Alcohol Use: No  . Drug Use: No  . Sexual Activity: Yes    Birth Control/ Protection: Post-menopausal    Other Topics Concern  . None   Social History Narrative  . None   Past Surgical History  Procedure Laterality Date  . Colonoscopy  11/17/08    ext hemorrhoids  . Gastro empy study  03/03/10    normal  . Esophagogastroduodenoscopy  04/05/10    [name] (43F),erosive reflux esophagitis, small hiatal hernia,  . Cholecystectomy    . Neck surgery  1998  . Hand surgery    . Breast lumpectomy      left  . Craniotomy      secondary TBI  . Nephrolithotomy  05/13/2012    Procedure: NEPHROLITHOTOMY PERCUTANEOUS;  Surgeon: Marcine Matar, MD;  Location: [facility];  Service: Urology;  Laterality: Left;     . Finger surgery      removal cyst left pinky   Past Medical History  Diagnosis Date  . Chronic nausea     normal GES   . Hemorrhoids 11/17/08    Colonoscopy Dr Karilyn Cota  . Chronic headaches     Dr Hermelinda [facility]us [place] [facility]  . PTSD (post-traumatic stress disorder)   . DM (diabetes mellitus)   . Torticollis   . Elevated liver enzymes     hx of, normal 03/2010  . Headache(784.0)   . Migraine without aura, with intractable migraine, so stated, without mention of status migrainosus   . Unspecified musculoskeletal disorders and symptoms referable to  neck     cervical/trapezius  . Occipital neuralgia   . Posttraumatic stress disorder   . Torticollis   . Cervicalgia   . Complication of anesthesia     had problem with local anesthesia-tried to assist Physician  . Renal lithiasis   . Glaucoma   . Seizure     12 yrs ago-med related  . Arthritis    BP 165/65  Pulse 58  Resp 14  Ht 5\' 4"  (1.626 m)  Wt 151 lb 12.8 oz (68.856 kg)  BMI 26.04 kg/m2  SpO2 98%    Review of Systems  Neurological: Positive for headaches.  All other systems reviewed and are negative.       Objective:   Physical Exam   Physical Exam  normal motor tone is noted in both LE's . Muscle testing reveals 5/5 muscle strength of the upper extremity, and 5/5 of the lower extremity. Full  range of motion in upper and lower extremities. ROM of Cervical spine is Restricted, because of pain. She has a continued trigger point in the right mid trap which definitively reproduces her pain. RTC, biceps, and labral maneuvers were negative to equivocal. Pain was reproduced with left lateral bending of the c-spine. She sits and stands in a head forward shoulders IR position.  Neuro:  Fine motor movements are normal in both hands.  Sensory is intact and symmetric to light touch, pinprick and proprioception.  DTR in the upper and lower extremity are present and symmetric 2+. No clonus is noted.  Patient arises from chair without difficulty. Tandem walk is stable. No pronator drift. Rhomberg negative. Right mid trap, suprapinatus tender to palpation with palpable trigger point.  Psych:in good spirits today. Very up beat.    Assessment & Plan:   Assessment:  1. History of cervicalgia, facet arthropathy, and chronic daily, intractable migraine \ headaches.  2. Posttraumatic stress syndrome.  3. Borderline Personality  4. Myofascial pain right trap, and supraspinatus to a lesser degree. There are no signs of any shoulder joint pathology (at least significant) on exam. There are no signs of radiculopathy either. There may be a facet/spondylosis referral compoment    PLAN:  1. Continue methadone rx. .Will continue with dilaudid for breakthrough headache daily.I will allow her to use 12mg  on "bad" days or when she has increased activity planned. We discussed the concern over escalating the dose of her dilaudid.   2. Scheduled naproxen 500mg  bid  3. Wean off trileptal as it makes her feel groggy.  4. Continue to focus on stress relieving activities as possible---spritual, vocational, physical, social  5. Will hold off on injection given the bruising she had. I think she would benefit from formal PT to address myofascial techniques and HEP to improve her trapezius myofascial pain.  6. 25 minutes of  face to face patient care time were spent during this visit. All questions were encouraged and answered. I'll see her back in 8 weeks.

## 2013-06-08 NOTE — Patient Instructions (Signed)
REMEMBER TO USE GOOD POSTURE AND KEEP YOUR HEAD UP!!!

## 2013-06-11 ENCOUNTER — Encounter: Payer: Worker's Compensation | Admitting: Physical Medicine & Rehabilitation

## 2013-07-30 ENCOUNTER — Encounter
Payer: Worker's Compensation | Attending: Physical Medicine & Rehabilitation | Admitting: Physical Medicine & Rehabilitation

## 2013-07-30 ENCOUNTER — Encounter: Payer: Self-pay | Admitting: Physical Medicine & Rehabilitation

## 2013-07-30 VITALS — BP 124/65 | HR 52 | Resp 14 | Ht 64.0 in | Wt 152.0 lb

## 2013-07-30 DIAGNOSIS — M5380 Other specified dorsopathies, site unspecified: Secondary | ICD-10-CM | POA: Diagnosis present

## 2013-07-30 DIAGNOSIS — G43919 Migraine, unspecified, intractable, without status migrainosus: Secondary | ICD-10-CM | POA: Insufficient documentation

## 2013-07-30 DIAGNOSIS — Z79899 Other long term (current) drug therapy: Secondary | ICD-10-CM | POA: Insufficient documentation

## 2013-07-30 DIAGNOSIS — IMO0001 Reserved for inherently not codable concepts without codable children: Secondary | ICD-10-CM | POA: Diagnosis not present

## 2013-07-30 DIAGNOSIS — Z5181 Encounter for therapeutic drug level monitoring: Secondary | ICD-10-CM | POA: Diagnosis present

## 2013-07-30 DIAGNOSIS — F603 Borderline personality disorder: Secondary | ICD-10-CM

## 2013-07-30 DIAGNOSIS — F329 Major depressive disorder, single episode, unspecified: Secondary | ICD-10-CM

## 2013-07-30 DIAGNOSIS — M47812 Spondylosis without myelopathy or radiculopathy, cervical region: Secondary | ICD-10-CM

## 2013-07-30 MED ORDER — METHADONE HCL 10 MG PO TABS: 20.0000 mg | ORAL_TABLET | Freq: Three times a day (TID) | ORAL | Status: DC

## 2013-07-30 MED ORDER — HYDROMORPHONE HCL 8 MG PO TABS: 8.0000 mg | ORAL_TABLET | Freq: Every day | ORAL | Status: DC | PRN

## 2013-07-30 MED ORDER — SUMATRIPTAN SUCCINATE 100 MG PO TABS: ORAL_TABLET | ORAL | Status: DC

## 2013-07-30 MED ORDER — PREDNISONE 20 MG PO TABS: 20.0000 mg | ORAL_TABLET | Freq: Every day | ORAL | Status: DC

## 2013-07-30 NOTE — Patient Instructions (Signed)
CALL ME WITH ANY PROBLEMS OR QUESTIONS (#[phone]).    HAPPY HOLIDAYS!!!!!   

## 2013-07-30 NOTE — Progress Notes (Signed)
Subjective:    Patient ID: Monica [name], female    DOB: [birth year], 62 y.o.   MRN: [id number]  HPI  [name] is back regarding her chronic neck/shoulder pain and headaches. She feels that the physical therapy has been extremely helpful. She is still having occasional shooting pain down the shoulder and right arm with activities and often towards the end of the day. It is not consistent. The severe pain in her right trap seems much better. She has tried TENS which has been helpful in relieving her pain for several hours. She also has tried some cervical traction on a limited basis which seems to have helped as well.   She remains on her other medications as prescribed without current issue.   She has been limited with some of her daily holiday activiites due to therapy and the ongoing pain in her right shoulder.   Pain Inventory Average Pain 8 Pain Right Now 7 My pain is constant, sharp, burning, stabbing and aching  In the last 24 hours, has pain interfered with the following? General activity 9 Relation with others 10 Enjoyment of life 9 What TIME of day is your pain at its worst? morning and day Sleep (in general) Fair  Pain is worse with: bending and some activites Pain improves with: rest, heat/ice, pacing activities and medication Relief from Meds: 6  Mobility walk without assistance  Function not employed: date last employed .  Neuro/Psych No problems in this area  Prior Studies Any changes since last visit?  no  Physicians involved in your care Any changes since last visit?  no   Family History  Problem Relation Age of Onset  . Colon cancer Mother 80   History   Social History  . Marital Status: Married    Spouse Name: N/A    Number of Children: 3  . Years of Education: N/A   Occupational History  . disabled    Social History Main Topics  . Smoking status: Never Smoker   . Smokeless tobacco: Never Used  . Alcohol Use: No  . Drug Use: No  .  Sexual Activity: Yes    Birth Control/ Protection: Post-menopausal   Other Topics Concern  . None   Social History Narrative  . None   Past Surgical History  Procedure Laterality Date  . Colonoscopy  11/17/08    ext hemorrhoids  . Gastro empy study  03/03/10    normal  . Esophagogastroduodenoscopy  04/05/10    [name] (35F),erosive reflux esophagitis, small hiatal hernia,  . Cholecystectomy    . Neck surgery  1998  . Hand surgery    . Breast lumpectomy      left  . Craniotomy      secondary TBI  . Nephrolithotomy  05/13/2012    Procedure: NEPHROLITHOTOMY PERCUTANEOUS;  Surgeon: Marcine Matar, MD;  Location: [facility];  Service: Urology;  Laterality: Left;     . Finger surgery      removal cyst left pinky   Past Medical History  Diagnosis Date  . Chronic nausea     normal GES   . Hemorrhoids 11/17/08    Colonoscopy Dr Karilyn Cota  . Chronic headaches     Dr Hermelinda [facility] [place] [facility]  . PTSD (post-traumatic stress disorder)   . DM (diabetes mellitus)   . Torticollis   . Elevated liver enzymes     hx of, normal 03/2010  . Headache(784.0)   . Migraine without aura, with intractable migraine, so  stated, without mention of status migrainosus   . Unspecified musculoskeletal disorders and symptoms referable to neck     cervical/trapezius  . Occipital neuralgia   . Posttraumatic stress disorder   . Torticollis   . Cervicalgia   . Complication of anesthesia     had problem with local anesthesia-tried to assist Physician  . Renal lithiasis   . Glaucoma   . Seizure     12 yrs ago-med related  . Arthritis    BP 124/65  Pulse 52  Resp 14  Ht 5\' 4"  (1.626 m)  Wt 152 lb (68.947 kg)  BMI 26.08 kg/m2  SpO2 94%     Review of Systems  Musculoskeletal: Positive for neck pain.  Neurological: Positive for headaches.  All other systems reviewed and are negative.       Objective:   Physical Exam  normal motor tone is noted in both LE's . DTR's appear to be  1+ to 2+ throughout.  Muscle testing reveals 5/5 muscle strength of the upper extremity, and 5/5 of the lower extremity. Full range of motion in upper and lower extremities. ROM of Cervical spine is Restricted, because of pain.  The only provocative movement of her neck was with extension on one occasion which caused tingling down her right arm. Spurling's test negative.  Right trap trigger point appears much better although area is still a little sensitive. .She sits and stands in a head forward shoulders IR position.  Neuro: Fine motor movements are normal in both hands.  Sensory is intact and symmetric to light touch, pinprick and proprioception.  DTR in the upper and lower extremity are present and symmetric 2+. No clonus is noted.  Patient arises from chair without difficulty. Tandem walk is stable. No pronator drift. Rhomberg negative. Right mid trap, suprapinatus tender to palpation with palpable trigger point.  Psych:in good spirits today. Very up beat.   Assessment & Plan:   Assessment:  1. History of cervicalgia, facet arthropathy, and chronic daily, intractable migraine \ headaches.  2. Posttraumatic stress syndrome.  3. Borderline Personality  4. Myofascial pain right trap, and supraspinatus to a lesser degree. Facet injury/irritation appears to be the major culprit behind this. She has no consistent neurological signs on exam.   PLAN:  1. Continue methadone rx. .Will continue with dilaudid for breakthrough headache daily.I will allow her to use 12mg  on "bad" days or when she has increased activity planned. 2. Scheduled naproxen 500mg  bid. Will add prednisone for arthritic pain, taper over 16 days 3. Continue to focus on stress relieving activities as possible---spritual, vocational, physical, social  4. She continues with HEP. Would like for her to be set up with a home cervical traction unit and TENS unit per PT. Also could consider facet/mb blocks if she fails to improve.  5. 20  minutes of face to face patient care time were spent during this visit. All questions were encouraged and answered. I'll see her back in 8 weeks.

## 2013-09-17 ENCOUNTER — Other Ambulatory Visit: Payer: Self-pay

## 2013-09-17 ENCOUNTER — Ambulatory Visit: Payer: Self-pay | Admitting: Physical Medicine & Rehabilitation

## 2013-09-17 MED ORDER — TOPIRAMATE 100 MG PO TABS: ORAL_TABLET | ORAL | Status: DC

## 2013-09-22 ENCOUNTER — Other Ambulatory Visit: Payer: Self-pay | Admitting: Physical Medicine & Rehabilitation

## 2013-09-30 ENCOUNTER — Telehealth: Payer: Self-pay

## 2013-09-30 DIAGNOSIS — M47812 Spondylosis without myelopathy or radiculopathy, cervical region: Secondary | ICD-10-CM

## 2013-09-30 DIAGNOSIS — G43919 Migraine, unspecified, intractable, without status migrainosus: Secondary | ICD-10-CM

## 2013-09-30 MED ORDER — METHADONE HCL 10 MG PO TABS: 20.0000 mg | ORAL_TABLET | Freq: Three times a day (TID) | ORAL | Status: DC

## 2013-09-30 NOTE — Telephone Encounter (Signed)
Patient is requesting a refill on Methadone. She will be out before her next appt on 3/10. RX printed for Dr. Riley KillSwartz to sign. Will contact patient when ready for pickup.

## 2013-10-01 NOTE — Telephone Encounter (Signed)
Left message for patient informing her that her methadone rx is ready for pick up.

## 2013-10-15 ENCOUNTER — Other Ambulatory Visit: Payer: Self-pay | Admitting: Physical Medicine & Rehabilitation

## 2013-10-19 ENCOUNTER — Encounter: Payer: Self-pay | Admitting: Physical Medicine & Rehabilitation

## 2013-10-19 ENCOUNTER — Encounter
Payer: Worker's Compensation | Attending: Physical Medicine & Rehabilitation | Admitting: Physical Medicine & Rehabilitation

## 2013-10-19 VITALS — BP 144/112 | HR 57 | Resp 14 | Ht 64.0 in | Wt 158.0 lb

## 2013-10-19 DIAGNOSIS — F32A Depression, unspecified: Secondary | ICD-10-CM

## 2013-10-19 DIAGNOSIS — M47812 Spondylosis without myelopathy or radiculopathy, cervical region: Secondary | ICD-10-CM

## 2013-10-19 DIAGNOSIS — M7918 Myalgia, other site: Secondary | ICD-10-CM

## 2013-10-19 DIAGNOSIS — IMO0001 Reserved for inherently not codable concepts without codable children: Secondary | ICD-10-CM

## 2013-10-19 DIAGNOSIS — F329 Major depressive disorder, single episode, unspecified: Secondary | ICD-10-CM

## 2013-10-19 DIAGNOSIS — G43919 Migraine, unspecified, intractable, without status migrainosus: Secondary | ICD-10-CM

## 2013-10-19 DIAGNOSIS — F3289 Other specified depressive episodes: Secondary | ICD-10-CM

## 2013-10-19 DIAGNOSIS — M531 Cervicobrachial syndrome: Secondary | ICD-10-CM

## 2013-10-19 MED ORDER — HYDROMORPHONE HCL 8 MG PO TABS: 8.0000 mg | ORAL_TABLET | Freq: Every day | ORAL | Status: DC | PRN

## 2013-10-19 MED ORDER — METHADONE HCL 10 MG PO TABS: 20.0000 mg | ORAL_TABLET | Freq: Three times a day (TID) | ORAL | Status: DC

## 2013-10-19 MED ORDER — LIDOCAINE 5 % EX PTCH: 1.0000 | MEDICATED_PATCH | CUTANEOUS | Status: DC

## 2013-10-19 NOTE — Progress Notes (Signed)
Subjective:    Patient ID: Monica LeepStacia L [name], female    DOB: [birth year], 63 y.o.   MRN: [id number]  HPI  Monica BillsStacia is back regarding her chronic pain. She feels that the cervical traction brace was helpful for her headaches and cervicalgia. She uses it in the morning for 15 minutes at 15 psi. It tends to provide her relief until mid afternoon. Her husband has noticed a difference in her demeanor.   Her right shoulder and trap area remain tight and problematic. The Pain level seems to be tied to the level of pain in her neck.   She continues on her pain medications per baseline.   She trialed capsaicin cream which caused her to burn and blister.  Pain Inventory Average Pain 9 Pain Right Now 9 My pain is constant, sharp, burning and stabbing  In the last 24 hours, has pain interfered with the following? General activity 8 Relation with others 9 Enjoyment of life 10 What TIME of day is your pain at its worst? day, night Sleep (in general) Poor  Pain is worse with: bending and some activites Pain improves with: rest and heat/ice Relief from Meds: 5  Mobility ability to climb steps?  yes do you drive?  yes  Function retired  Neuro/Psych numbness tingling confusion depression  Prior Studies Any changes since last visit?  no  Physicians involved in your care Any changes since last visit?  no   Family History  Problem Relation Age of Onset  . Colon cancer Mother [id number]   History   Social History  . Marital Status: Married    Spouse Name: N/A    Number of Children: 3  . Years of Education: N/A   Occupational History  . disabled    Social History Main Topics  . Smoking status: Never Smoker   . Smokeless tobacco: Never Used  . Alcohol Use: No  . Drug Use: No  . Sexual Activity: Yes    Birth Control/ Protection: Post-menopausal   Other Topics Concern  . None   Social History Narrative  . None   Past Surgical History  Procedure Laterality Date  .  Colonoscopy  11/17/08    ext hemorrhoids  . Gastro empy study  03/03/10    normal  . Esophagogastroduodenoscopy  04/05/10    Monica [name]'s ring (53F),erosive reflux esophagitis, small hiatal hernia,  . Cholecystectomy    . Neck surgery  1998  . Hand surgery    . Breast lumpectomy      left  . Craniotomy      secondary TBI  . Nephrolithotomy  05/13/2012    Procedure: NEPHROLITHOTOMY PERCUTANEOUS;  Surgeon: Monica MatarStephen Dahlstedt, MD;  Location: [facility];  Service: Urology;  Laterality: Left;     . Finger surgery      removal cyst left pinky   Past Medical History  Diagnosis Date  . Chronic nausea     normal GES   . Hemorrhoids 11/17/08    Colonoscopy Dr Karilyn Cotaehman  . Chronic headaches     Dr Hermelinda MedicusSchwartz [place] [facility]  . PTSD (post-traumatic stress disorder)   . DM (diabetes mellitus)   . Torticollis   . Elevated liver enzymes     hx of, normal 03/2010  . Headache(784.0)   . Migraine without aura, with intractable migraine, so stated, without mention of status migrainosus   . Unspecified musculoskeletal disorders and symptoms referable to neck     cervical/trapezius  . Occipital neuralgia   . Posttraumatic  stress disorder   . Torticollis   . Cervicalgia   . Complication of anesthesia     had problem with local anesthesia-tried to assist Physician  . Renal lithiasis   . Glaucoma   . Seizure     12 yrs ago-med related  . Arthritis    BP 144/112  Pulse 57  Resp 14  Ht 5\' 4"  (1.626 m)  Wt 158 lb (71.668 kg)  BMI 27.11 kg/m2  SpO2 98%  Opioid Risk Score:   Fall Risk Score: Moderate Fall Risk (6-13 points) (patient educated handout declined)   Review of Systems  Respiratory: Positive for shortness of breath.   Musculoskeletal: Positive for arthralgias and myalgias.  Neurological: Positive for numbness and headaches.  Psychiatric/Behavioral: Positive for confusion and dysphoric mood.       Objective:   Physical Exam  normal motor tone is noted in both LE's . DTR's  appear to be 1+ to 2+ throughout. Muscle testing reveals 5/5 muscle strength of the upper extremity, and 5/5 of the lower extremity. Full range of motion in upper and lower extremities. ROM of Cervical spine is Restricted, because of pain. The only provocative movement of her neck was with extension on one occasion which caused tingling down her right arm. Spurling's test negative. Right trap/suprapinatus trigger point appears tender along its middle segment .She sits and stands in a head forward shoulders IR position.  Neuro: Fine motor movements are normal in both hands.  Sensory is intact and symmetric to light touch, pinprick and proprioception.  DTR in the upper and lower extremity are present and symmetric 2+. No clonus is noted.  Patient arises from chair without difficulty. Tandem walk is stable. No pronator drift. Rhomberg negative. Right mid trap, suprapinatus tender to palpation with palpable trigger point.  Psych:in good spirits today. Very up beat.    Assessment & Plan:   Assessment:  1. History of cervicalgia, facet arthropathy, and chronic daily, intractable migraine \ headaches.  2. Posttraumatic stress syndrome.  3. Borderline Personality  4. Myofascial pain right trap, and supraspinatus to a lesser degree. Facet injury/irritation appears to be the major culprit behind this. She has no consistent neurological signs on exam.    PLAN:  1. Continue methadone rx. .Will continue with dilaudid for breakthrough headache daily.I will allow her to use 12mg  on "bad" days or when she has increased activity planned.  2. Scheduled naproxen 500mg  bid. Will add prednisone for arthritic pain, taper over 16 days  3. Will make a referral to outpt therapy for a trial of kinesiotape to the right trap area.  Will also trial lidoderm patch to right trap.  4. Increase traction use at home: INCREASE USE OF TRACTION TO TWICE DAILY FOR 15 MINUTES PER SESSION. IF NO IMPROVEMENT CONSIDER INCREASING TIME  IN TRACTION COLLAR TO 20-25 MINUTES. IF STILL NO IMPROVEMENT, INCREASE LBS OF PRESSURE TO 20LBS  5. 20 minutes of face to face patient care time were spent during this visit. All questions were encouraged and answered. I'll see her back in 8 weeks.

## 2013-10-19 NOTE — Patient Instructions (Signed)
TRACTION INCREASE:  INCREASE USE OF TRACTION TO TWICE DAILY FOR 15 MINUTES PER SESSION. IF NO IMPROVEMENT CONSIDER INCREASING TIME IN TRACTION COLLAR TO 20-25 MINUTES. IF STILL NO IMPROVEMENT, INCREASE LBS OF PRESSURE TO 20LBS

## 2013-11-03 ENCOUNTER — Other Ambulatory Visit: Payer: Self-pay | Admitting: Gastroenterology

## 2013-11-15 ENCOUNTER — Encounter: Payer: Self-pay | Admitting: Physical Medicine & Rehabilitation

## 2013-12-15 ENCOUNTER — Encounter: Payer: Self-pay | Admitting: Physical Medicine & Rehabilitation

## 2013-12-15 ENCOUNTER — Encounter
Payer: Worker's Compensation | Attending: Physical Medicine & Rehabilitation | Admitting: Physical Medicine & Rehabilitation

## 2013-12-15 VITALS — BP 134/76 | HR 58 | Resp 14 | Ht 64.0 in | Wt 153.0 lb

## 2013-12-15 DIAGNOSIS — F4310 Post-traumatic stress disorder, unspecified: Secondary | ICD-10-CM | POA: Insufficient documentation

## 2013-12-15 DIAGNOSIS — M531 Cervicobrachial syndrome: Secondary | ICD-10-CM | POA: Insufficient documentation

## 2013-12-15 DIAGNOSIS — M47812 Spondylosis without myelopathy or radiculopathy, cervical region: Secondary | ICD-10-CM

## 2013-12-15 DIAGNOSIS — M7918 Myalgia, other site: Secondary | ICD-10-CM

## 2013-12-15 DIAGNOSIS — IMO0001 Reserved for inherently not codable concepts without codable children: Secondary | ICD-10-CM | POA: Insufficient documentation

## 2013-12-15 DIAGNOSIS — G43919 Migraine, unspecified, intractable, without status migrainosus: Secondary | ICD-10-CM

## 2013-12-15 DIAGNOSIS — M5380 Other specified dorsopathies, site unspecified: Secondary | ICD-10-CM | POA: Insufficient documentation

## 2013-12-15 DIAGNOSIS — E119 Type 2 diabetes mellitus without complications: Secondary | ICD-10-CM | POA: Insufficient documentation

## 2013-12-15 MED ORDER — HYDROMORPHONE HCL 8 MG PO TABS: 8.0000 mg | ORAL_TABLET | Freq: Every day | ORAL | Status: DC | PRN

## 2013-12-15 MED ORDER — METHADONE HCL 10 MG PO TABS: 20.0000 mg | ORAL_TABLET | Freq: Three times a day (TID) | ORAL | Status: DC

## 2013-12-15 NOTE — Progress Notes (Signed)
Subjective:    Patient ID: Monica [name], female    DOB: 07-11-1951, 63 y.o.   MRN: [id number]  HPI  Monica [name] is back regarding her chronic headaches and cervicalgia.  She had good results with the K-tape. She was given instruction/husband was instructed on placement.   Regarding her traction, she finds it helpful but only when her K-tape is off. For some reason, it tends to exacerbate her pain while the tape is in appropriate position.   She is not finding that the tens unit is helping---she wants to send it back.     She just was blessed with 2 new grandchildren which she has been very excited about.   Pain Inventory Average Pain 9 Pain Right Now 8 My pain is constant and sharp  In the last 24 hours, has pain interfered with the following? General activity 8 Relation with others 9 Enjoyment of life 10 What TIME of day is your pain at its worst? daytime Sleep (in general) Poor  Pain is worse with: bending and some activites Pain improves with: rest, heat/ice, pacing activities and medication Relief from Meds: 2  Mobility Do you have any goals in this area?  no  Function Do you have any goals in this area?  no  Neuro/Psych No problems in this area  Prior Studies Any changes since last visit?  no  Physicians involved in your care Any changes since last visit?  no   Family History  Problem Relation Age of Onset  . Colon cancer Mother 1360   History   Social History  . Marital Status: Married    Spouse Name: N/A    Number of Children: 3  . Years of Education: N/A   Occupational History  . disabled    Social History Main Topics  . Smoking status: Never Smoker   . Smokeless tobacco: Never Used  . Alcohol Use: No  . Drug Use: No  . Sexual Activity: Yes    Birth Control/ Protection: Post-menopausal   Other Topics Concern  . None   Social History Narrative  . None   Past Surgical History  Procedure Laterality Date  . Colonoscopy  11/17/08    ext  hemorrhoids  . Gastro empy study  03/03/10    normal  . Esophagogastroduodenoscopy  04/05/10    [name] (58F),erosive reflux esophagitis, small hiatal hernia,  . Cholecystectomy    . Neck surgery  1998  . Hand surgery    . Breast lumpectomy      left  . Craniotomy      secondary TBI  . Nephrolithotomy  05/13/2012    Procedure: NEPHROLITHOTOMY PERCUTANEOUS;  Surgeon: Marcine MatarStephen Dahlstedt, MD;  Location: [facility];  Service: Urology;  Laterality: Left;     . Finger surgery      removal cyst left pinky   Past Medical History  Diagnosis Date  . Chronic nausea     normal GES   . Hemorrhoids 11/17/08    Colonoscopy Dr Karilyn Cotaehman  . Chronic headaches     Dr Hermelinda MedicusSchwartz [place] [facility]  . PTSD (post-traumatic stress disorder)   . DM (diabetes mellitus)   . Torticollis   . Elevated liver enzymes     hx of, normal 03/2010  . Headache(784.0)   . Migraine without aura, with intractable migraine, so stated, without mention of status migrainosus   . Unspecified musculoskeletal disorders and symptoms referable to neck     cervical/trapezius  . Occipital neuralgia   .  Posttraumatic stress disorder   . Torticollis   . Cervicalgia   . Complication of anesthesia     had problem with local anesthesia-tried to assist Physician  . Renal lithiasis   . Glaucoma   . Seizure     12 yrs ago-med related  . Arthritis    BP 134/76  Pulse 58  Resp 14  Ht 5\' 4"  (1.626 m)  Wt 153 lb (69.4 kg)  BMI 26.25 kg/m2  SpO2 98%  Opioid Risk Score:   Fall Risk Score: Moderate Fall Risk (6-13 points) (patient educated handout declined)   Review of Systems  Constitutional: Positive for fever, chills, diaphoresis and appetite change.  Respiratory: Positive for shortness of breath.   Gastrointestinal: Positive for nausea.  Neurological: Positive for headaches.  All other systems reviewed and are negative.      Objective:   Physical Exam normal motor tone is noted in both LE's . DTR's appear to  be 1+ to 2+ throughout. Muscle testing reveals 5/5 muscle strength of the upper extremity, and 5/5 of the lower extremity. Full range of motion in upper and lower extremities.  Cervical ROM shows some improvement. Spurling's test negative. Right trap/suprapinatus trigger point still present along middle segment .She sits and stands in a head forward shoulders IR position. Fairly good scapulohumeral rhythm. Neuro: Fine motor movements are normal in both hands.  Sensory is intact and symmetric to light touch, pinprick and proprioception.  DTR in the upper and lower extremity are present and symmetric 2+. No clonus is noted.  Patient arises from chair without difficulty. Tandem walk is stable. No pronator drift. Rhomberg negative.  Psych:in good spirits today. Very up beat.    Assessment & Plan:   Assessment:  1. History of cervicalgia, facet arthropathy, and chronic daily, intractable migraine \ headaches.  2. Posttraumatic stress syndrome.  3. Borderline Personality--hx of 4. Myofascial pain right trap, and supraspinatus to a lesser degree. Facet injury/irritation appears to be the major culprit behind this. She has no consistent neurological signs on exam.      PLAN:  1. Continue methadone rx. .Will continue with dilaudid for breakthrough headache daily . Second rx's were written for next month.  2. Naproxen---back off to PRN scheduling---take with food. 3. Continue K-taping which seems to be working well. .  4. Maintain traction between k-tape. Discussed the importance of posture and appropriate strength and ROM. 5. 20 minutes of face to face patient care time were spent during this visit. All questions were encouraged and answered. I'll see her back in 8 weeks.

## 2013-12-15 NOTE — Patient Instructions (Signed)
CONTINUE TO WORK ON YOUR RANGE OF MOTION, POSTURE, REST BREAKS EVERY DAY ----MIX STRETCHES AND REST BREAKS INTO YOUR DAILY WORK AND ACTIVITIES.

## 2014-01-22 ENCOUNTER — Other Ambulatory Visit: Payer: Self-pay | Admitting: Physical Medicine & Rehabilitation

## 2014-02-07 ENCOUNTER — Telehealth: Payer: Self-pay | Admitting: *Deleted

## 2014-02-07 NOTE — Telephone Encounter (Signed)
[name] called because she is going to be out of her methadone by this weekend and her appt is not until 02/16/14 with Dr Riley KillSwartz.  I have left a message for her to call us back to see if she would like to see Riley Lamunice this week and that way she will not have to make a second trip back to see Dr Riley KillSwartz after picking up her rx.  Awaiting call back. (methadone filled 01/04/14)

## 2014-02-08 ENCOUNTER — Encounter: Payer: Worker's Compensation | Attending: Physical Medicine & Rehabilitation | Admitting: Registered Nurse

## 2014-02-08 ENCOUNTER — Encounter: Payer: Self-pay | Admitting: Registered Nurse

## 2014-02-08 VITALS — BP 136/55 | HR 57 | Resp 14 | Ht 64.0 in | Wt 155.0 lb

## 2014-02-08 DIAGNOSIS — M7918 Myalgia, other site: Secondary | ICD-10-CM

## 2014-02-08 DIAGNOSIS — M25519 Pain in unspecified shoulder: Secondary | ICD-10-CM | POA: Insufficient documentation

## 2014-02-08 DIAGNOSIS — G8929 Other chronic pain: Secondary | ICD-10-CM | POA: Insufficient documentation

## 2014-02-08 DIAGNOSIS — E119 Type 2 diabetes mellitus without complications: Secondary | ICD-10-CM | POA: Insufficient documentation

## 2014-02-08 DIAGNOSIS — IMO0001 Reserved for inherently not codable concepts without codable children: Secondary | ICD-10-CM

## 2014-02-08 DIAGNOSIS — G43909 Migraine, unspecified, not intractable, without status migrainosus: Secondary | ICD-10-CM | POA: Insufficient documentation

## 2014-02-08 DIAGNOSIS — G43019 Migraine without aura, intractable, without status migrainosus: Secondary | ICD-10-CM

## 2014-02-08 DIAGNOSIS — M531 Cervicobrachial syndrome: Secondary | ICD-10-CM

## 2014-02-08 DIAGNOSIS — M47812 Spondylosis without myelopathy or radiculopathy, cervical region: Secondary | ICD-10-CM

## 2014-02-08 DIAGNOSIS — M129 Arthropathy, unspecified: Secondary | ICD-10-CM | POA: Insufficient documentation

## 2014-02-08 DIAGNOSIS — Z79899 Other long term (current) drug therapy: Secondary | ICD-10-CM

## 2014-02-08 DIAGNOSIS — M542 Cervicalgia: Secondary | ICD-10-CM | POA: Insufficient documentation

## 2014-02-08 DIAGNOSIS — Z5181 Encounter for therapeutic drug level monitoring: Secondary | ICD-10-CM

## 2014-02-08 DIAGNOSIS — F4310 Post-traumatic stress disorder, unspecified: Secondary | ICD-10-CM | POA: Insufficient documentation

## 2014-02-08 DIAGNOSIS — G43119 Migraine with aura, intractable, without status migrainosus: Secondary | ICD-10-CM

## 2014-02-08 MED ORDER — HYDROMORPHONE HCL 8 MG PO TABS: 8.0000 mg | ORAL_TABLET | Freq: Every day | ORAL | Status: DC | PRN

## 2014-02-08 NOTE — Progress Notes (Signed)
Subjective:    Patient ID: Monica [name], female    DOB: February 21, 1951, [age] y.o.   MRN: [id number]  HPI : Monica [name] is a 63 year old female who returns for follow up for chronic pain and medication refill. She says her pain is located in her head. She rates her pain 6. She is wearing the K-tape. She states the K-Tape really helps. She was feeling well for the last few weeks. Had to put the K-tape on today for headache. Her current exercise regime is  Pain Inventory Average Pain 7 Pain Right Now 6 My pain is constant, sharp, burning, stabbing, tingling and aching  In the last 24 hours, has pain interfered with the following? General activity 8 Relation with others 8 Enjoyment of life 10 What TIME of day is your pain at its worst? morning, daytime Sleep (in general) Poor  Pain is worse with: bending and some activites Pain improves with: heat/ice, pacing activities and medication Relief from Meds: 5  Mobility walk without assistance ability to climb steps?  yes do you drive?  yes transfers alone Do you have any goals in this area?  no  Function disabled: date disabled na Do you have any goals in this area?  no  Neuro/Psych No problems in this area  Prior Studies Any changes since last visit?  no  Physicians involved in your care Any changes since last visit?  no   Family History  Problem Relation Age of Onset  . Colon cancer Mother 2960   History   Social History  . Marital Status: Married    Spouse Name: N/A    Number of Children: 3  . Years of Education: N/A   Occupational History  . disabled    Social History Main Topics  . Smoking status: Never Smoker   . Smokeless tobacco: Never Used  . Alcohol Use: No  . Drug Use: No  . Sexual Activity: Yes    Birth Control/ Protection: Post-menopausal   Other Topics Concern  . None   Social History Narrative  . None   Past Surgical History  Procedure Laterality Date  . Colonoscopy  11/17/08    ext  hemorrhoids  . Gastro empy study  03/03/10    normal  . Esophagogastroduodenoscopy  04/05/10    [name] (46F),erosive reflux esophagitis, small hiatal hernia,  . Cholecystectomy    . Neck surgery  1998  . Hand surgery    . Breast lumpectomy      left  . Craniotomy      secondary TBI  . Nephrolithotomy  05/13/2012    Procedure: NEPHROLITHOTOMY PERCUTANEOUS;  Surgeon: Marcine MatarStephen Dahlstedt, MD;  Location: [facility];  Service: Urology;  Laterality: Left;     . Finger surgery      removal cyst left pinky   Past Medical History  Diagnosis Date  . Chronic nausea     normal GES   . Hemorrhoids 11/17/08    Colonoscopy Dr Karilyn Cotaehman  . Chronic headaches     Dr Hermelinda MedicusSchwartz [place] [facility]  . PTSD (post-traumatic stress disorder)   . DM (diabetes mellitus)   . Torticollis   . Elevated liver enzymes     hx of, normal 03/2010  . Headache(784.0)   . Migraine without aura, with intractable migraine, so stated, without mention of status migrainosus   . Unspecified musculoskeletal disorders and symptoms referable to neck     cervical/trapezius  . Occipital neuralgia   .  Posttraumatic stress disorder   . Torticollis   . Cervicalgia   . Complication of anesthesia     had problem with local anesthesia-tried to assist Physician  . Renal lithiasis   . Glaucoma   . Seizure     12 yrs ago-med related  . Arthritis    BP 136/55  Pulse 57  Resp 14  Ht 5\' 4"  (1.626 m)  Wt 155 lb (70.308 kg)  BMI 26.59 kg/m2  SpO2 97%  Opioid Risk Score:   Fall Risk Score: Moderate Fall Risk (6-13 points) (pt eductaed on fall risk, brochure given to pt previously)   Review of Systems  Musculoskeletal: Positive for neck pain.  Neurological: Positive for headaches.  All other systems reviewed and are negative.      Objective:   Physical Exam  Nursing note and vitals reviewed. Constitutional: She is oriented to person, place, and time. She appears well-developed and well-nourished.  HENT:  Head:  Normocephalic and atraumatic.  Neck: Normal range of motion. Neck supple.  Cardiovascular: Normal rate, regular rhythm and normal heart sounds.   Pulmonary/Chest: Effort normal and breath sounds normal.  Musculoskeletal:  Normal Muscle Bulk and Muscle testing Reveals: Upper  Extremities: Full ROM and Muscle Strength 5/5 Back without spinal or paraspinal Tenderness Lower Extremities:Full ROM and Muscle Strength 5/5. Arises from chair with ease. Narrow based Gait  Neurological: She is alert and oriented to person, place, and time.  Skin: Skin is warm and dry.  Psychiatric: She has a normal mood and affect.          Assessment & Plan:  1. History of cervicalgia, facet arthropathy, and chronic daily, intractable migraine \ headaches.  Refilled: Topamax 2 tablets in the morning and 4 tablets at bedtime. Continue with Dilaudid 8 mg 1-1.5 tablets  As needed for sever headache  #35. 2. Posttraumatic stress syndrome. Continue to Monitor 3. Myofascial pain right trap: Continue Current Medication regime. Continue with exercise therapy. Continue with K-tapping.   20 minutes of face to face patient care time was spent during this visit. All questions were encouraged and answered.   F/U in  1 month

## 2014-02-08 NOTE — Telephone Encounter (Signed)
Patient has a appt with Riley LamEunice today 6/30.

## 2014-02-10 ENCOUNTER — Other Ambulatory Visit: Payer: Self-pay | Admitting: Physical Medicine & Rehabilitation

## 2014-02-14 ENCOUNTER — Telehealth: Payer: Self-pay | Admitting: Registered Nurse

## 2014-02-14 NOTE — Telephone Encounter (Signed)
Spoke with Dr. Riley KillSwartz today.  [name] had nephrolithias, and her insurance company sent her letter stating it was most likely related to her Topamax. I called [name], no answer left her message to call. The Plan is to weaned her off her topamax: Topamax 100 mg QHS for a week then Topamax 50 mg HS for a week then discontinued. Start her on Depakote 250mg  BID for two weeks  Awaiting on a call from [name].

## 2014-02-16 ENCOUNTER — Encounter: Payer: Worker's Compensation | Admitting: Physical Medicine & Rehabilitation

## 2014-02-16 ENCOUNTER — Telehealth: Payer: Self-pay | Admitting: *Deleted

## 2014-02-16 NOTE — Telephone Encounter (Signed)
Left voicemail on personally identified voicemail to call our office back.  She has not returned the call from Jacalyn LefevreEunice Thomas NP and she needs to speak with her about her topamax.

## 2014-02-16 NOTE — Telephone Encounter (Signed)
Chaka called back.  No one was available to speak with her.

## 2014-02-17 MED ORDER — DIVALPROEX SODIUM 250 MG PO DR TAB: 250.0000 mg | DELAYED_RELEASE_TABLET | Freq: Two times a day (BID) | ORAL | Status: DC

## 2014-02-17 NOTE — Telephone Encounter (Signed)
Spoke with Monica [name], She will start the weaning process of Topax on July 10th, 2015 Topamax 100 mg QHS x 1 week On July 17th Topamax 50 mg QHS x 1 week then discontinued Start Depakote 250 mg BID start on July 10th 2015

## 2014-03-08 ENCOUNTER — Encounter: Payer: Self-pay | Admitting: Physical Medicine & Rehabilitation

## 2014-03-08 ENCOUNTER — Encounter
Payer: Worker's Compensation | Attending: Physical Medicine & Rehabilitation | Admitting: Physical Medicine & Rehabilitation

## 2014-03-08 VITALS — BP 141/69 | HR 59 | Resp 16 | Ht 64.0 in | Wt 151.0 lb

## 2014-03-08 DIAGNOSIS — N200 Calculus of kidney: Secondary | ICD-10-CM

## 2014-03-08 DIAGNOSIS — M7918 Myalgia, other site: Secondary | ICD-10-CM

## 2014-03-08 DIAGNOSIS — M531 Cervicobrachial syndrome: Secondary | ICD-10-CM | POA: Insufficient documentation

## 2014-03-08 DIAGNOSIS — G43711 Chronic migraine without aura, intractable, with status migrainosus: Secondary | ICD-10-CM

## 2014-03-08 DIAGNOSIS — IMO0001 Reserved for inherently not codable concepts without codable children: Secondary | ICD-10-CM

## 2014-03-08 DIAGNOSIS — M47812 Spondylosis without myelopathy or radiculopathy, cervical region: Secondary | ICD-10-CM

## 2014-03-08 DIAGNOSIS — G43019 Migraine without aura, intractable, without status migrainosus: Secondary | ICD-10-CM | POA: Insufficient documentation

## 2014-03-08 MED ORDER — METHADONE HCL 10 MG PO TABS: 20.0000 mg | ORAL_TABLET | Freq: Three times a day (TID) | ORAL | Status: DC

## 2014-03-08 MED ORDER — HYDROMORPHONE HCL 8 MG PO TABS: 8.0000 mg | ORAL_TABLET | Freq: Every day | ORAL | Status: DC | PRN

## 2014-03-08 NOTE — Progress Notes (Signed)
Subjective:    Patient ID: Monica [name], female    DOB: 08-Apr-1951, 63 y.o.   MRN: [id number]  HPI  Monica [name] is back regarding her chronic pain. Overall she's been doing well. Her k-tape still works well. She really even hasn't had to use it much lately as her pain levels have been lower. She has some on today after she walked all three of her dogs at once.    We attempted to convert her to VPA due to kidney stones, but she didn't tolerate the change to the VPA---she had increased headaches and substantial fatigue as well as N/V.  Her activity levels plummeted. She has since stopped and is back on full dose of topamax which is 100mg  in AM and 200mg  in PM. It seems to be working very well----she feels that the "holiday " from the topamax may have potentiated its effects.     Pain Inventory Average Pain 6 Pain Right Now 6 My pain is n/a  In the last 24 hours, has pain interfered with the following? General activity 6 Relation with others 6 Enjoyment of life 7 What TIME of day is your pain at its worst? daytime Sleep (in general) Fair  Pain is worse with: bending and some activites Pain improves with: heat/ice, pacing activities and medication Relief from Meds: 5  Mobility Do you have any goals in this area?  no  Function Do you have any goals in this area?  no  Neuro/Psych No problems in this area  Prior Studies Any changes since last visit?  no  Physicians involved in your care Any changes since last visit?  no   Family History  Problem Relation Age of Onset  . Colon cancer Mother 73   History   Social History  . Marital Status: Married    Spouse Name: N/A    Number of Children: 3  . Years of Education: N/A   Occupational History  . disabled    Social History Main Topics  . Smoking status: Never Smoker   . Smokeless tobacco: Never Used  . Alcohol Use: No  . Drug Use: No  . Sexual Activity: Yes    Birth Control/ Protection: Post-menopausal   Other  Topics Concern  . None   Social History Narrative  . None   Past Surgical History  Procedure Laterality Date  . Colonoscopy  11/17/08    ext hemorrhoids  . Gastro empy study  03/03/10    normal  . 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 Cholecystectomy    . Neck surgery  1998  . Hand surgery    . Breast lumpectomy      left  . Craniotomy      secondary TBI  . Nephrolithotomy  05/13/2012    Procedure: NEPHROLITHOTOMY PERCUTANEOUS;  Surgeon: Marcine Matar, MD;  Location: [facility];  Service: Urology;  Laterality: Left;     . Finger surgery      removal cyst left pinky   Past Medical History  Diagnosis Date  . Chronic nausea     normal GES   . Hemorrhoids 11/17/08    Colonoscopy Dr Karilyn Cota  . Chronic headaches     Dr Hermelinda Medicus [place] [facility]  . PTSD (post-traumatic stress disorder)   . DM (diabetes mellitus)   . Torticollis   . Elevated liver enzymes     hx of, normal 03/2010  . Headache(784.0)   . Migraine  without aura, with intractable migraine, so stated, without mention of status migrainosus   . Unspecified musculoskeletal disorders and symptoms referable to neck     cervical/trapezius  . Occipital neuralgia   . Posttraumatic stress disorder   . Torticollis   . Cervicalgia   . Complication of anesthesia     had problem with local anesthesia-tried to assist Physician  . Renal lithiasis   . Glaucoma   . Seizure     12 yrs ago-med related  . Arthritis    BP 141/69  Pulse 59  Resp 16  Ht 5\' 4"  (1.626 m)  Wt 151 lb (68.493 kg)  BMI 25.91 kg/m2  SpO2 98%  Opioid Risk Score:   Fall Risk Score: Moderate Fall Risk (6-13 points) (patient educated handout declined)   Review of Systems  Neurological: Positive for headaches.  All other systems reviewed and are negative.      Objective:   Physical Exam  normal motor tone is noted in both LE's . DTR's appear to be 1+ to 2+  throughout. Muscle testing reveals 5/5 muscle strength of the upper extremity, and 5/5 of the lower extremity. Full range of motion in upper and lower extremities. Cervical ROM shows some improvement. Spurling's test negative. Right trap/suprapinatus trigger point still present along middle segment .She sits and stands in a head forward shoulders IR position. Fairly good scapulohumeral rhythm.  Neuro: Fine motor movements are normal in both hands.  Sensory is intact and symmetric to light touch, pinprick and proprioception.  DTR in the upper and lower extremity are present and symmetric 2+. No clonus is noted.  Patient arises from chair without difficulty. Tandem walk is stable. No pronator drift. Rhomberg negative.  Psych:in good spirits today. Very up beat.    Assessment & Plan:   Assessment:  1. History of cervicalgia, facet arthropathy, and chronic daily, intractable migraine \ headaches.  2. Posttraumatic stress syndrome.  3. Borderline Personality--hx of  4. Myofascial pain right trap, and supraspinatus to a lesser degree. Facet injury/irritation appears to be the major culprit behind this. She has no consistent neurological signs on exam.    PLAN:  1. Continue methadone rx. .Will continue with dilaudid for breakthrough headache daily . Second rx's were written for next month.  2. Naproxen---prn-take with food. Resumed topamax at full dose. Discussed diet mod to decreased calcium oxalate---she should also discuss with her urologist.  3. Continue K-taping which seems to be working well. Discussed appropriate positioning/posture when she works.  4. Maintain traction between k-tape. Use ktape as needed.  5. 20 minutes of face to face patient care time were spent during this visit. All questions were encouraged and answered. I'll see her back in 8 weeks.

## 2014-03-09 ENCOUNTER — Encounter: Payer: Worker's Compensation | Admitting: Registered Nurse

## 2014-04-05 ENCOUNTER — Other Ambulatory Visit: Payer: Self-pay | Admitting: Physical Medicine & Rehabilitation

## 2014-04-08 ENCOUNTER — Ambulatory Visit: Payer: Self-pay | Admitting: Physical Medicine & Rehabilitation

## 2014-05-09 ENCOUNTER — Encounter: Payer: Self-pay | Admitting: Physical Medicine & Rehabilitation

## 2014-05-09 ENCOUNTER — Encounter
Payer: Worker's Compensation | Attending: Physical Medicine & Rehabilitation | Admitting: Physical Medicine & Rehabilitation

## 2014-05-09 VITALS — BP 152/76 | HR 52 | Resp 14 | Wt 151.8 lb

## 2014-05-09 DIAGNOSIS — F603 Borderline personality disorder: Secondary | ICD-10-CM | POA: Diagnosis not present

## 2014-05-09 DIAGNOSIS — M25519 Pain in unspecified shoulder: Secondary | ICD-10-CM | POA: Diagnosis not present

## 2014-05-09 DIAGNOSIS — G43919 Migraine, unspecified, intractable, without status migrainosus: Secondary | ICD-10-CM | POA: Diagnosis not present

## 2014-05-09 DIAGNOSIS — F4310 Post-traumatic stress disorder, unspecified: Secondary | ICD-10-CM | POA: Insufficient documentation

## 2014-05-09 DIAGNOSIS — Z5181 Encounter for therapeutic drug level monitoring: Secondary | ICD-10-CM

## 2014-05-09 DIAGNOSIS — G43019 Migraine without aura, intractable, without status migrainosus: Secondary | ICD-10-CM

## 2014-05-09 DIAGNOSIS — G43711 Chronic migraine without aura, intractable, with status migrainosus: Secondary | ICD-10-CM

## 2014-05-09 DIAGNOSIS — IMO0001 Reserved for inherently not codable concepts without codable children: Secondary | ICD-10-CM | POA: Diagnosis not present

## 2014-05-09 DIAGNOSIS — G8929 Other chronic pain: Secondary | ICD-10-CM | POA: Diagnosis present

## 2014-05-09 DIAGNOSIS — E119 Type 2 diabetes mellitus without complications: Secondary | ICD-10-CM | POA: Insufficient documentation

## 2014-05-09 DIAGNOSIS — M47812 Spondylosis without myelopathy or radiculopathy, cervical region: Secondary | ICD-10-CM

## 2014-05-09 DIAGNOSIS — M7918 Myalgia, other site: Secondary | ICD-10-CM

## 2014-05-09 DIAGNOSIS — Z79899 Other long term (current) drug therapy: Secondary | ICD-10-CM

## 2014-05-09 DIAGNOSIS — M531 Cervicobrachial syndrome: Secondary | ICD-10-CM

## 2014-05-09 MED ORDER — METHADONE HCL 10 MG PO TABS: 20.0000 mg | ORAL_TABLET | Freq: Three times a day (TID) | ORAL | Status: DC

## 2014-05-09 MED ORDER — HYDROMORPHONE HCL 8 MG PO TABS: 8.0000 mg | ORAL_TABLET | Freq: Every day | ORAL | Status: DC | PRN

## 2014-05-09 MED ORDER — LIDOCAINE 5 % EX PTCH: MEDICATED_PATCH | CUTANEOUS | Status: DC

## 2014-05-09 MED ORDER — NAPROXEN 500 MG PO TABS: ORAL_TABLET | ORAL | Status: DC

## 2014-05-09 NOTE — Patient Instructions (Signed)
CONTINUE TO WORK ON APPROPRIATE POSTURE AND GOOD TECHNIQUE/FORM

## 2014-05-09 NOTE — Progress Notes (Signed)
Subjective:    Patient ID: Monica [name], female    DOB: July 21, 1951, 63 y.o.   MRN: [id number]  HPI  Monica [name] is back regarding her chronic pain. She has been maintaining her K-tape which seems to work. She finds the right shoulder and trap acts up if she uses the arm for prolonged periods of time.  She just returned from a trip to the beach which was quite therapeutic for her.   Her medications haven't changed. She has remained compliant with her medications.   Pain Inventory Average Pain 8 Pain Right Now 6 My pain is constant, sharp, burning, dull, stabbing, tingling and aching  In the last 24 hours, has pain interfered with the following? General activity 8 Relation with others 8 Enjoyment of life 8 What TIME of day is your pain at its worst? morning and daytime Sleep (in general) Poor  Pain is worse with: bending and some activites Pain improves with: rest, heat/ice, therapy/exercise, pacing activities and medication Relief from Meds: 7  Mobility walk without assistance Do you have any goals in this area?  no  Function Do you have any goals in this area?  no  Neuro/Psych No problems in this area  Prior Studies Any changes since last visit?  no  Physicians involved in your care Any changes since last visit?  no   Family History  Problem Relation Age of Onset  . Colon cancer Mother 77   History   Social History  . Marital Status: Married    Spouse Name: N/A    Number of Children: 3  . Years of Education: N/A   Occupational History  . disabled    Social History Main Topics  . Smoking status: Never Smoker   . Smokeless tobacco: Never Used  . Alcohol Use: No  . Drug Use: No  . Sexual Activity: Yes    Birth Control/ Protection: Post-menopausal   Other Topics Concern  . None   Social History Narrative  . None   Past Surgical History  Procedure Laterality Date  . Colonoscopy  11/17/08    ext hemorrhoids  . Gastro empy study  03/03/10    normal   . 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 Cholecystectomy    . Neck surgery  1998  . Hand surgery    . Breast lumpectomy      left  . Craniotomy      secondary TBI  . Nephrolithotomy  05/13/2012    Procedure: NEPHROLITHOTOMY PERCUTANEOUS;  Surgeon: Marcine Matar, MD;  Location: [facility];  Service: Urology;  Laterality: Left;     . Finger surgery      removal cyst left pinky   Past Medical History  Diagnosis Date  . Chronic nausea     normal GES   . Hemorrhoids 11/17/08    Colonoscopy Dr Karilyn Cota  . Chronic headaches     Dr Hermelinda Medicus [place] [facility]  . PTSD (post-traumatic stress disorder)   . DM (diabetes mellitus)   . Torticollis   . Elevated liver enzymes     hx of, normal 03/2010  . Headache(784.0)   . Migraine without aura, with intractable migraine, so stated, without mention of status migrainosus   . Unspecified musculoskeletal disorders and symptoms referable to neck     cervical/trapezius  . Occipital neuralgia   . Posttraumatic stress disorder   . Torticollis   . Cervicalgia   . Complication of  anesthesia     had problem with local anesthesia-tried to assist Physician  . Renal lithiasis   . Glaucoma   . Seizure     12 yrs ago-med related  . Arthritis    BP 152/76  Pulse 52  Resp 14  Wt 151 lb 12.8 oz (68.856 kg)  SpO2 97%  Opioid Risk Score:   Fall Risk Score: Moderate Fall Risk (6-13 points) (previously educated and given handout)  Review of Systems  Constitutional: Positive for chills and diaphoresis.  Gastrointestinal: Positive for nausea, diarrhea and constipation.  Endocrine:       Fluctuating blood sugars  Neurological: Positive for headaches.  All other systems reviewed and are negative.      Objective:   Physical Exam  normal motor tone is noted in both LE's . DTR's appear to be 1+ to 2+ throughout. Muscle testing reveals 5/5 muscle strength of the upper  extremity, and 5/5 of the lower extremity. Full range of motion in upper and lower extremities. Cervical ROM shows some improvement. Spurling's test negative. Right trap/suprapinatus area remains generally tender. Posture fair. She sits and stands in a head forward shoulders IR position. Fairly good scapulohumeral rhythm.  Neuro: Fine motor movements are normal in both hands.  Sensory is intact and symmetric to light touch, pinprick and proprioception.  DTR in the upper and lower extremity are present and symmetric 2+. No clonus is noted.  Patient arises from chair without difficulty. Tandem walk is stable. No pronator drift. Rhomberg negative.  Psych: remains in good spirits today.  .   Assessment & Plan:   Assessment:  1. History of cervicalgia, facet arthropathy, and chronic daily, intractable migraine \ headaches.  2. Posttraumatic stress syndrome.  3. Borderline Personality--hx of  4. Myofascial pain right trap, and supraspinatus to a lesser degree. Facet injury/irritation appears to be the major culprit behind this. She has no consistent neurological signs on exam.   PLAN:  1. Continue methadone rx. .Will continue with dilaudid for breakthrough headache daily . Second rx's were written for next month.  2. Naproxen---prn.  Resumed topamax at full dose. Understands the risk for stones. 3. Continue K-taping which seems to be working well. Discussed pathology of her myofascial pain 4. Maintain traction between k-tape.   5. 15 minutes of face to face patient care time were spent during this visit. All questions were encouraged and answered. I'll see her back in 8 weeks.

## 2014-06-28 ENCOUNTER — Other Ambulatory Visit: Payer: Self-pay | Admitting: Physical Medicine & Rehabilitation

## 2014-07-11 ENCOUNTER — Encounter
Payer: Worker's Compensation | Attending: Physical Medicine & Rehabilitation | Admitting: Physical Medicine & Rehabilitation

## 2014-07-11 ENCOUNTER — Encounter: Payer: Self-pay | Admitting: Physical Medicine & Rehabilitation

## 2014-07-11 VITALS — BP 136/70 | HR 57 | Resp 14 | Ht 64.0 in | Wt 150.0 lb

## 2014-07-11 DIAGNOSIS — M542 Cervicalgia: Secondary | ICD-10-CM | POA: Diagnosis not present

## 2014-07-11 DIAGNOSIS — G43919 Migraine, unspecified, intractable, without status migrainosus: Secondary | ICD-10-CM | POA: Insufficient documentation

## 2014-07-11 DIAGNOSIS — F4310 Post-traumatic stress disorder, unspecified: Secondary | ICD-10-CM | POA: Diagnosis not present

## 2014-07-11 DIAGNOSIS — G43419 Hemiplegic migraine, intractable, without status migrainosus: Secondary | ICD-10-CM

## 2014-07-11 DIAGNOSIS — G43019 Migraine without aura, intractable, without status migrainosus: Secondary | ICD-10-CM

## 2014-07-11 DIAGNOSIS — M797 Fibromyalgia: Secondary | ICD-10-CM

## 2014-07-11 DIAGNOSIS — M129 Arthropathy, unspecified: Secondary | ICD-10-CM | POA: Insufficient documentation

## 2014-07-11 DIAGNOSIS — G43711 Chronic migraine without aura, intractable, with status migrainosus: Secondary | ICD-10-CM

## 2014-07-11 DIAGNOSIS — M7918 Myalgia, other site: Secondary | ICD-10-CM

## 2014-07-11 DIAGNOSIS — G8929 Other chronic pain: Secondary | ICD-10-CM | POA: Diagnosis present

## 2014-07-11 DIAGNOSIS — M5382 Other specified dorsopathies, cervical region: Secondary | ICD-10-CM

## 2014-07-11 DIAGNOSIS — F603 Borderline personality disorder: Secondary | ICD-10-CM | POA: Diagnosis not present

## 2014-07-11 DIAGNOSIS — M791 Myalgia: Secondary | ICD-10-CM | POA: Insufficient documentation

## 2014-07-11 DIAGNOSIS — M47812 Spondylosis without myelopathy or radiculopathy, cervical region: Secondary | ICD-10-CM

## 2014-07-11 MED ORDER — HYDROMORPHONE HCL 8 MG PO TABS: 8.0000 mg | ORAL_TABLET | Freq: Every day | ORAL | Status: DC | PRN

## 2014-07-11 MED ORDER — METHADONE HCL 10 MG PO TABS: 20.0000 mg | ORAL_TABLET | Freq: Three times a day (TID) | ORAL | Status: DC

## 2014-07-11 MED ORDER — SUMATRIPTAN SUCCINATE 100 MG PO TABS: ORAL_TABLET | ORAL | Status: DC

## 2014-07-11 NOTE — Progress Notes (Signed)
Subjective:    Patient ID: Monica [name], female    DOB: Jul 28, 1951, 63 y.o.   MRN: [id number]  HPI   [name] is back regarding her chronic pain. She's had her ups and downs but for the most part has remained fairly stable. She and her husband have decided to do more for themselves --ie travel, etc. She finds that her family increases her stress levels in general.  She remains on her methadone and dilaudid for breakthrough pain. imitrex works well for breakthrough migraines.      Pain Inventory Average Pain 8 Pain Right Now 7 My pain is constant, sharp, burning, dull, stabbing, tingling and aching  In the last 24 hours, has pain interfered with the following? General activity 9 Relation with others 9 Enjoyment of life 9 What TIME of day is your pain at its worst? morning, daytime Sleep (in general) Poor  Pain is worse with: bending and some activites Pain improves with: rest, heat/ice, pacing activities and medication Relief from Meds: 4  Mobility Do you have any goals in this area?  no  Function Do you have any goals in this area?  no  Neuro/Psych No problems in this area  Prior Studies Any changes since last visit?  no  Physicians involved in your care Any changes since last visit?  no   Family History  Problem Relation Age of Onset  . Colon cancer Mother 5160   History   Social History  . Marital Status: Married    Spouse Name: N/A    Number of Children: 3  . Years of Education: N/A   Occupational History  . disabled    Social History Main Topics  . Smoking status: Never Smoker   . Smokeless tobacco: Never Used  . Alcohol Use: No  . Drug Use: No  . Sexual Activity: Yes    Birth Control/ Protection: Post-menopausal   Other Topics Concern  . None   Social History Narrative   Past Surgical History  Procedure Laterality Date  . Colonoscopy  11/17/08    ext hemorrhoids  . Gastro empy study  03/03/10    normal  . Esophagogastroduodenoscopy   04/05/10    [name] (95F),erosive reflux esophagitis, small hiatal hernia,  . Cholecystectomy    . Neck surgery  1998  . Hand surgery    . Breast lumpectomy      left  . Craniotomy      secondary TBI  . Nephrolithotomy  05/13/2012    Procedure: NEPHROLITHOTOMY PERCUTANEOUS;  Surgeon: Marcine MatarStephen Dahlstedt, MD;  Location: [facility];  Service: Urology;  Laterality: Left;     . Finger surgery      removal cyst left pinky   Past Medical History  Diagnosis Date  . Chronic nausea     normal GES   . Hemorrhoids 11/17/08    Colonoscopy Dr Karilyn Cotaehman  . Chronic headaches     Dr Hermelinda MedicusSchwartz [place] [facility]  . PTSD (post-traumatic stress disorder)   . DM (diabetes mellitus)   . Torticollis   . Elevated liver enzymes     hx of, normal 03/2010  . Headache(784.0)   . Migraine without aura, with intractable migraine, so stated, without mention of status migrainosus   . Unspecified musculoskeletal disorders and symptoms referable to neck     cervical/trapezius  . Occipital neuralgia   . Posttraumatic stress disorder   . Torticollis   . Cervicalgia   . Complication of anesthesia  had problem with local anesthesia-tried to assist Physician  . Renal lithiasis   . Glaucoma   . Seizure     12 yrs ago-med related  . Arthritis    BP 136/70 mmHg  Pulse 57  Resp 14  Ht 5\' 4"  (1.626 m)  Wt 150 lb (68.04 kg)  BMI 25.73 kg/m2  SpO2 96%  Opioid Risk Score:   Fall Risk Score: Low Fall Risk (0-5 points) Review of Systems  All other systems reviewed and are negative.      Objective:   Physical Exam  normal motor tone is noted in both LE's . DTR's appear to be 1+ to 2+ throughout. Muscle testing reveals 5/5 muscle strength of the upper extremity, and 5/5 of the lower extremity. Full range of motion in upper and lower extremities. Cervical ROM shows some improvement. Spurling's test negative. Right trap/suprapinatus area remains generally tender. Posture fair. Less head forward position  today.   good scapulohumeral rhythm.  Neuro: Fine motor movements are normal in both hands.  Sensory is intact and symmetric to light touch, pinprick and proprioception.  DTR in the upper and lower extremity are present and symmetric 2+. No clonus is noted.  Patient arises from chair without difficulty. Tandem walk is stable.  Psych:  in good spirits today. .  Assessment & Plan:   Assessment:  1. History of cervicalgia, facet arthropathy, and chronic daily, intractable migraine \ headaches.  2. Posttraumatic stress syndrome.  3. Borderline Personality--hx of  4. Myofascial pain right trap, and supraspinatus to a lesser degree. Facet injury/irritation appears to be the major culprit behind this. She has no consistent neurological signs on exam.    PLAN:  1. Continue methadone rx. Will continue with dilaudid for breakthrough headache daily . Second rx's were written for next month.  2. Naproxen---to take prn.    3. continue topamax at full dose. Understands the risk for stones.  4. Maintain posture and K-taping.  5. Maintain traction between k-tape. She wants to try aromatherapy which may help her relax.  6. 15 minutes of face to face patient care time were spent during this visit. All questions were encouraged and answered. I'll see her back in 8 weeks. I will be anxious to see how her new philosophy of finding "me time" for herself and husband affects her pain levels.

## 2014-07-11 NOTE — Patient Instructions (Signed)
PLEASE CALL ME WITH ANY PROBLEMS OR QUESTIONS (#[phone]).      

## 2014-07-29 ENCOUNTER — Other Ambulatory Visit: Payer: Self-pay | Admitting: Physical Medicine & Rehabilitation

## 2014-09-09 ENCOUNTER — Encounter: Payer: Self-pay | Admitting: Physical Medicine & Rehabilitation

## 2014-09-09 ENCOUNTER — Encounter
Payer: Worker's Compensation | Attending: Physical Medicine & Rehabilitation | Admitting: Physical Medicine & Rehabilitation

## 2014-09-09 ENCOUNTER — Other Ambulatory Visit: Payer: Self-pay | Admitting: Physical Medicine & Rehabilitation

## 2014-09-09 VITALS — BP 140/60 | HR 60 | Resp 14

## 2014-09-09 DIAGNOSIS — M542 Cervicalgia: Secondary | ICD-10-CM | POA: Diagnosis not present

## 2014-09-09 DIAGNOSIS — M7918 Myalgia, other site: Secondary | ICD-10-CM

## 2014-09-09 DIAGNOSIS — M791 Myalgia: Secondary | ICD-10-CM | POA: Diagnosis not present

## 2014-09-09 DIAGNOSIS — F4310 Post-traumatic stress disorder, unspecified: Secondary | ICD-10-CM | POA: Insufficient documentation

## 2014-09-09 DIAGNOSIS — F603 Borderline personality disorder: Secondary | ICD-10-CM | POA: Insufficient documentation

## 2014-09-09 DIAGNOSIS — M47812 Spondylosis without myelopathy or radiculopathy, cervical region: Secondary | ICD-10-CM

## 2014-09-09 DIAGNOSIS — G43711 Chronic migraine without aura, intractable, with status migrainosus: Secondary | ICD-10-CM

## 2014-09-09 DIAGNOSIS — M129 Arthropathy, unspecified: Secondary | ICD-10-CM | POA: Insufficient documentation

## 2014-09-09 DIAGNOSIS — G8929 Other chronic pain: Secondary | ICD-10-CM | POA: Diagnosis present

## 2014-09-09 DIAGNOSIS — G43919 Migraine, unspecified, intractable, without status migrainosus: Secondary | ICD-10-CM | POA: Insufficient documentation

## 2014-09-09 DIAGNOSIS — G43019 Migraine without aura, intractable, without status migrainosus: Secondary | ICD-10-CM

## 2014-09-09 DIAGNOSIS — M797 Fibromyalgia: Secondary | ICD-10-CM

## 2014-09-09 DIAGNOSIS — G43011 Migraine without aura, intractable, with status migrainosus: Secondary | ICD-10-CM

## 2014-09-09 DIAGNOSIS — M5382 Other specified dorsopathies, cervical region: Secondary | ICD-10-CM

## 2014-09-09 DIAGNOSIS — Z5181 Encounter for therapeutic drug level monitoring: Secondary | ICD-10-CM

## 2014-09-09 DIAGNOSIS — M5090 Cervical disc disorder, unspecified, unspecified cervical region: Secondary | ICD-10-CM

## 2014-09-09 DIAGNOSIS — Z79899 Other long term (current) drug therapy: Secondary | ICD-10-CM

## 2014-09-09 MED ORDER — HYDROMORPHONE HCL 8 MG PO TABS: 8.0000 mg | ORAL_TABLET | Freq: Every day | ORAL | Status: DC | PRN

## 2014-09-09 MED ORDER — METHADONE HCL 10 MG PO TABS: 20.0000 mg | ORAL_TABLET | Freq: Three times a day (TID) | ORAL | Status: DC

## 2014-09-09 NOTE — Patient Instructions (Signed)
PLEASE CALL ME WITH ANY PROBLEMS OR QUESTIONS (#[phone]).     PENDING YOUR MRI FINDINGS WE WILL PURSUE INTERVENTIONAL MANAGEMENT OF YOUR NECK PAIN

## 2014-09-09 NOTE — Progress Notes (Signed)
Subjective:    Patient ID: Monica [name], female    DOB: 1950-09-12, 64 y.o.   MRN: [id number]  HPI   [name] is back regarding her chronic pain and headaches. She has had a "rough" month. Her left neck, in particular, has been bothering her. She has pain with basic movement of her neck and head which affect her ADL's and basic mobility and leisure tasks around the house. She has documented cervical facet disease but I don't have an image of her neck on record since a CT from 2003.   She continues on her baseline medications for pain. They don't seem to be working as well for her.    Pain Inventory Average Pain 8 Pain Right Now 7 My pain is constant, sharp, burning, stabbing and aching  In the last 24 hours, has pain interfered with the following? General activity 9 Relation with others 8 Enjoyment of life 9 What TIME of day is your pain at its worst? morning and daytime  Sleep (in general) Poor  Pain is worse with: bending and some activites Pain improves with: rest, heat/ice, pacing activities and medication Relief from Meds: 6  Mobility walk without assistance how many minutes can you walk? 20 ability to climb steps?  yes do you drive?  yes  Function disabled: date disabled .  Neuro/Psych depression  Prior Studies Any changes since last visit?  no  Physicians involved in your care Any changes since last visit?  no   Family History  Problem Relation Age of Onset  . Colon cancer Mother 8460   History   Social History  . Marital Status: Married    Spouse Name: N/A    Number of Children: 3  . Years of Education: N/A   Occupational History  . disabled    Social History Main Topics  . Smoking status: Never Smoker   . Smokeless tobacco: Never Used  . Alcohol Use: No  . Drug Use: No  . Sexual Activity: Yes    Birth Control/ Protection: Post-menopausal   Other Topics Concern  . None   Social History Narrative   Past Surgical History  Procedure  Laterality Date  . Colonoscopy  11/17/08    ext hemorrhoids  . Gastro empy study  03/03/10    normal  . Esophagogastroduodenoscopy  04/05/10    [name] (24F),erosive reflux esophagitis, small hiatal hernia,  . Cholecystectomy    . Neck surgery  1998  . Hand surgery    . Breast lumpectomy      left  . Craniotomy      secondary TBI  . Nephrolithotomy  05/13/2012    Procedure: NEPHROLITHOTOMY PERCUTANEOUS;  Surgeon: Marcine MatarStephen Dahlstedt, MD;  Location: [facility];  Service: Urology;  Laterality: Left;     . Finger surgery      removal cyst left pinky   Past Medical History  Diagnosis Date  . Chronic nausea     normal GES   . Hemorrhoids 11/17/08    Colonoscopy Dr Karilyn Cotaehman  . Chronic headaches     Dr Hermelinda MedicusSchwartz [place] [facility]  . PTSD (post-traumatic stress disorder)   . DM (diabetes mellitus)   . Torticollis   . Elevated liver enzymes     hx of, normal 03/2010  . Headache(784.0)   . Migraine without aura, with intractable migraine, so stated, without mention of status migrainosus   . Unspecified musculoskeletal disorders and symptoms referable to neck     cervical/trapezius  . Occipital  neuralgia   . Posttraumatic stress disorder   . Torticollis   . Cervicalgia   . Complication of anesthesia     had problem with local anesthesia-tried to assist Physician  . Renal lithiasis   . Glaucoma   . Seizure     12 yrs ago-med related  . Arthritis    BP 140/60 mmHg  Pulse 60  Resp 14  SpO2 99%  Opioid Risk Score:   Fall Risk Score: Moderate Fall Risk (6-13 points)  Review of Systems  Constitutional: Negative.   Eyes: Negative.   Respiratory: Negative.   Cardiovascular: Negative.   Gastrointestinal: Negative.   Endocrine: Negative.   Genitourinary: Negative.   Musculoskeletal: Negative.   Skin: Negative.   Allergic/Immunologic: Negative.   Neurological: Positive for headaches.  Hematological: Negative.   Psychiatric/Behavioral: Positive for dysphoric mood.        Objective:   Physical Exam  normal motor tone is noted in both LE's . DTR's appear to be 1+ to 2+ throughout. Muscle testing reveals 5/5 muscle strength of the upper extremity, and 5/5 of the lower extremity. Full range of motion in upper and lower extremities. She is limited with cervical ROM in all planes. Had more pain with extension than flexion. Most severe pain is along the left neck. Cervcial parapsinal muscles are tight bilaterally left more than right.  Posture fair. Less head forward position today. good scapulohumeral rhythm.  Neuro: Fine motor movements are normal in both hands.  Sensory is intact and symmetric to light touch, pinprick and proprioception.  DTR in the upper and lower extremity are present and symmetric 2+. No clonus is noted.  Patient arises from chair without difficulty. Tandem walk is stable.  Psych: in fairly good spirits today.    Assessment & Plan:   Assessment:  1. History of cervicalgia, facet arthropathy, and chronic daily, intractable migraine headaches.  2. Posttraumatic stress syndrome.  3. Borderline Personality--hx of  4. Myofascial pain right trap, and supraspinatus to a lesser degree. Facet injury/irritation appears to be the major culprit behind this. She has no consistent neurological signs on exam.    PLAN:  1. Continue methadone rx. Will continue with dilaudid for breakthrough headache daily . Second rx's were written for next month.  2. Naproxen---to take prn.  3. continue topamax at full dose. Understands the risk for stones.  4. MRI of the cervical spine to assess disks and facets in detail. I think that she would respond from cervical medial branch blocks but we need updated imaging to determine the extent of her disease. Additionally, she displays some disk pain signs on exam and by history. The last imagine I have on record is from 2003.    5. Maintain traction between k-tape. She wants to try aromatherapy which may help her relax.  6.  25 minutes of face to face patient care time were spent during this visit. All questions were encouraged and answered. I'll see her back in 8 weeks.

## 2014-09-09 NOTE — Addendum Note (Signed)
Addended by: Angela NevinWESSLING, Sherrel Shafer D on: 09/09/2014 01:01 PM   Modules accepted: Orders

## 2014-09-10 LAB — PMP ALCOHOL METABOLITE (ETG): ETGU: NEGATIVE ng/mL

## 2014-09-13 LAB — METHADONE (GC/LC/MS), URINE
EDDPUC: 13065 ng/mL (ref <100)
Methadone (GC/LC/MS), ur confirm: 11280 ng/mL (ref <100)

## 2014-09-14 LAB — PRESCRIPTION MONITORING PROFILE (SOLSTAS)
Amphetamine/Meth: NEGATIVE ng/mL
BARBITURATE SCREEN, URINE: NEGATIVE ng/mL
BUPRENORPHINE, URINE: NEGATIVE ng/mL
Benzodiazepine Screen, Urine: NEGATIVE ng/mL
CANNABINOID SCRN UR: NEGATIVE ng/mL
Carisoprodol, Urine: NEGATIVE ng/mL
Cocaine Metabolites: NEGATIVE ng/mL
Creatinine, Urine: 159.24 mg/dL (ref >20.0)
FENTANYL URINE: NEGATIVE ng/mL
MDMA URINE: NEGATIVE ng/mL
MEPERIDINE UR: NEGATIVE ng/mL
Nitrites, Initial: NEGATIVE ug/mL
Opiate Screen, Urine: NEGATIVE ng/mL
Oxycodone Screen, Ur: NEGATIVE ng/mL
Propoxyphene: NEGATIVE ng/mL
TRAMADOL UR: NEGATIVE ng/mL
Tapentadol, urine: NEGATIVE ng/mL
ZOLPIDEM, URINE: NEGATIVE ng/mL
pH, Initial: 5 pH (ref 4.5–8.9)

## 2014-09-20 NOTE — Progress Notes (Signed)
Urine drug screen for this encounter is consistent for prescribed medication 

## 2014-10-03 ENCOUNTER — Other Ambulatory Visit: Payer: Self-pay | Admitting: Physical Medicine & Rehabilitation

## 2014-10-24 ENCOUNTER — Telehealth: Payer: Self-pay | Admitting: *Deleted

## 2014-10-24 NOTE — Telephone Encounter (Signed)
I received a call from Josette from [employer] that Monica BillsStacia shows a methadon rx presented at pharmacy written by a Monica CroftShaw MD and when she calls the number linked to the MD [facility].  I told her that she has an appt 11/08/14 to get her refill.  Josette knows that sometime the pharmacy will put in under wrong DEA and she will call the pharmacy to follow up on this.

## 2014-11-01 ENCOUNTER — Other Ambulatory Visit: Payer: Self-pay | Admitting: Physical Medicine & Rehabilitation

## 2014-11-02 ENCOUNTER — Other Ambulatory Visit: Payer: Self-pay | Admitting: Physical Medicine & Rehabilitation

## 2014-11-02 ENCOUNTER — Telehealth: Payer: Self-pay | Admitting: *Deleted

## 2014-11-02 MED ORDER — DIAZEPAM 5 MG PO TABS: ORAL_TABLET | ORAL | Status: DC

## 2014-11-02 NOTE — Telephone Encounter (Signed)
Phoned in prescription, Pt is getting imaging [facility], I called their facility and they could not guarantee results would be ready by the next day. I called the pt and notified, she thought it would be best to reschedule. Pt is now scheduled for 12/06/2014

## 2014-11-02 NOTE — Telephone Encounter (Signed)
Pt finally got an appt for the MRI that you ordered. The appt is Mar 28th. The pt has an appt with you on March 29th and she is concerned if you will have rec'd the images in time or should she reschedule. She says she does not want to waste a visit and wants to make sure you have the images prior to her arrival. ALSO, she is asking for a script for pre-procedure medication to calm her prior to the MRI. She says she does not do well with confined spaces

## 2014-11-02 NOTE — Telephone Encounter (Signed)
Can have valium 5mg  30 minutes prior to procedure. Can repeat x 1 if needed. If MRI is done within the [facility] system I should have results byt the following day

## 2014-11-03 ENCOUNTER — Telehealth: Payer: Self-pay | Admitting: *Deleted

## 2014-11-03 DIAGNOSIS — G43711 Chronic migraine without aura, intractable, with status migrainosus: Secondary | ICD-10-CM

## 2014-11-03 DIAGNOSIS — M47812 Spondylosis without myelopathy or radiculopathy, cervical region: Secondary | ICD-10-CM

## 2014-11-03 MED ORDER — METHADONE HCL 10 MG PO TABS: 20.0000 mg | ORAL_TABLET | Freq: Three times a day (TID) | ORAL | Status: DC

## 2014-11-03 NOTE — Telephone Encounter (Signed)
Monica BillsStacia called and because we have moved her appointment to accommodate her MRI on Monday and appt moved to 12/06/14, she will have to have a bridge on her methadone to get through to the appointment. It was last filled 10/24/14 and will last through 11/22/14.  She will need enough to get to 12/06/14 appt.  (NOTE: I asked about her hydromorphone but she does not want her hydromorphone any longer.  It is affecting her breathing and she does not want to be on it.)  This will be a 14 day bridge on her methadone #84  Her [facility] will pick [place] on Monday [facility] [place] [facility] [place]

## 2014-11-07 ENCOUNTER — Encounter: Payer: Self-pay | Admitting: Family Medicine

## 2014-11-08 ENCOUNTER — Encounter: Payer: Worker's Compensation | Admitting: Physical Medicine & Rehabilitation

## 2014-11-25 ENCOUNTER — Telehealth: Payer: Self-pay | Admitting: *Deleted

## 2014-11-25 NOTE — Telephone Encounter (Signed)
Monica [name] cannot take her dilaudid any longer because it affects her breathing.  She has not been able to get out of bed for a week because of migraines.  She is wanting to know if there is something else she can do.  I have called her back and we are moving her appt up to next week with Dr Riley KillSwartz.

## 2014-11-28 ENCOUNTER — Encounter
Payer: Worker's Compensation | Attending: Physical Medicine & Rehabilitation | Admitting: Physical Medicine & Rehabilitation

## 2014-11-28 ENCOUNTER — Encounter: Payer: Self-pay | Admitting: Physical Medicine & Rehabilitation

## 2014-11-28 ENCOUNTER — Other Ambulatory Visit: Payer: Self-pay | Admitting: Physical Medicine & Rehabilitation

## 2014-11-28 VITALS — BP 148/78 | HR 58 | Resp 14

## 2014-11-28 DIAGNOSIS — M5382 Other specified dorsopathies, cervical region: Secondary | ICD-10-CM

## 2014-11-28 DIAGNOSIS — F4310 Post-traumatic stress disorder, unspecified: Secondary | ICD-10-CM | POA: Insufficient documentation

## 2014-11-28 DIAGNOSIS — G43711 Chronic migraine without aura, intractable, with status migrainosus: Secondary | ICD-10-CM

## 2014-11-28 DIAGNOSIS — G43919 Migraine, unspecified, intractable, without status migrainosus: Secondary | ICD-10-CM | POA: Insufficient documentation

## 2014-11-28 DIAGNOSIS — M791 Myalgia: Secondary | ICD-10-CM | POA: Insufficient documentation

## 2014-11-28 DIAGNOSIS — M542 Cervicalgia: Secondary | ICD-10-CM | POA: Insufficient documentation

## 2014-11-28 DIAGNOSIS — M47812 Spondylosis without myelopathy or radiculopathy, cervical region: Secondary | ICD-10-CM

## 2014-11-28 DIAGNOSIS — M7918 Myalgia, other site: Secondary | ICD-10-CM

## 2014-11-28 DIAGNOSIS — M797 Fibromyalgia: Secondary | ICD-10-CM | POA: Diagnosis not present

## 2014-11-28 DIAGNOSIS — G8929 Other chronic pain: Secondary | ICD-10-CM | POA: Insufficient documentation

## 2014-11-28 DIAGNOSIS — F603 Borderline personality disorder: Secondary | ICD-10-CM | POA: Diagnosis not present

## 2014-11-28 DIAGNOSIS — M129 Arthropathy, unspecified: Secondary | ICD-10-CM | POA: Insufficient documentation

## 2014-11-28 MED ORDER — MEPERIDINE HCL 50 MG PO TABS: 50.0000 mg | ORAL_TABLET | Freq: Every day | ORAL | Status: DC | PRN

## 2014-11-28 MED ORDER — METHADONE HCL 10 MG PO TABS: 20.0000 mg | ORAL_TABLET | Freq: Three times a day (TID) | ORAL | Status: DC

## 2014-11-28 NOTE — Patient Instructions (Signed)
PLEASE CALL ME WITH ANY PROBLEMS OR QUESTIONS (#[phone]).    PURCHASE A CERVICAL TRACTION COLLAR

## 2014-11-28 NOTE — Progress Notes (Signed)
Subjective:    Patient ID: Monica [name], female    DOB: 15-May-1951, 64 y.o.   MRN: [id number]  HPI   Monica [name] is here in follow up of her chronic pain. Monica [name] has had problems with her breathing over the last and Monica [name] decided it was related to her hydromorphone. Since stopping it, Monica [name]'s had more headaches and cervical pain. Her pain is worse usually as the day moves along and improves at night. It's hard to perform her work at home, sometimes even hold her head up.   I reviewed her Cervical MRI and Monica [name]'s MRI was notable for severe facet disease on the left at C3-4.  This facet disease is more severe than her prior study from 2010.     Pain Inventory Average Pain 9 Pain Right Now 9 My pain is constant, sharp, burning and aching  In the last 24 hours, has pain interfered with the following? General activity 10 Relation with others 10 Enjoyment of life 10 What TIME of day is your pain at its worst? morning, daytime and night Sleep (in general) Poor  Pain is worse with: walking, bending and some activites Pain improves with: rest, heat/ice, pacing activities and medication Relief from Meds: 3  Mobil walk without assistance how many minutes can you walk? 20 ability to climb steps?  yes do you drive?  yes Function disabled: date disabled .  Neuro/Psych depression anxiety  Prior Studies Any changes since last visit?  no  Physicians involved in your care Any changes since last visit?  no   Family History  Problem Relation Age of Onset  . Colon cancer Mother 93   History   Social History  . Marital Status: Married    Spouse Name: N/A  . Number of Children: 3  . Years of Education: N/A   Occupational History  . disabled    Social History Main Topics  . Smoking status: Never Smoker   . Smokeless tobacco: Never Used  . Alcohol Use: No  . Drug Use: No  . Sexual Activity: Yes    Birth Control/ Protection: Post-menopausal   Other Topics Concern  . None    Social History Narrative   Past Surgical History  Procedure Laterality Date  . Colonoscopy  11/17/08    ext hemorrhoids  . Gastro empy study  03/03/10    normal  . Esophagogastroduodenoscopy  04/05/10    [name] (12F),erosive reflux esophagitis, small hiatal hernia,  . Cholecystectomy    . Neck surgery  1998  . Hand surgery    . Breast lumpectomy      left  . Craniotomy      secondary TBI  . Nephrolithotomy  05/13/2012    Procedure: NEPHROLITHOTOMY PERCUTANEOUS;  Surgeon: Marcine Matar, MD;  Location: [facility];  Service: Urology;  Laterality: Left;     . Finger surgery      removal cyst left pinky   Past Medical History  Diagnosis Date  . Chronic nausea     normal GES   . Hemorrhoids 11/17/08    Colonoscopy Dr Karilyn Cota  . Chronic headaches     Dr Hermelinda Medicus [place] [facility]  . PTSD (post-traumatic stress disorder)   . DM (diabetes mellitus)   . Torticollis   . Elevated liver enzymes     hx of, normal 03/2010  . Headache(784.0)   . Migraine without aura, with intractable migraine, so stated, without mention of status migrainosus   . Unspecified musculoskeletal disorders and symptoms  referable to neck     cervical/trapezius  . Occipital neuralgia   . Posttraumatic stress disorder   . Torticollis   . Cervicalgia   . Complication of anesthesia     had problem with local anesthesia-tried to assist Physician  . Renal lithiasis   . Glaucoma   . Seizure     12 yrs ago-med related  . Arthritis    BP 148/78 mmHg  Pulse 58  Resp 14  SpO2 97%  Opioid Risk Score:   Fall Risk Score: Moderate Fall Risk (6-13 points)`1  Depression screen PHQ 2/9  Depression screen PHQ 2/9 05/13/2012  Decreased Interest 0  Down, Depressed, Hopeless 0  PHQ - 2 Score 0     Review of Systems  Constitutional: Negative.   Eyes: Negative.   Respiratory: Positive for shortness of breath.   Cardiovascular: Negative.   Gastrointestinal: Positive for nausea and vomiting.   Endocrine: Negative.   Musculoskeletal: Negative.   Skin: Negative.   Allergic/Immunologic: Negative.   Neurological: Positive for headaches.  Hematological: Negative.   Psychiatric/Behavioral: Positive for dysphoric mood. The patient is nervous/anxious.        Objective:   Physical Exam   normal motor tone is noted in both LE's . DTR's appear to be 1+ to 2+ throughout. Muscle testing reveals 5/5 muscle strength of the upper extremity, and 5/5 of the lower extremity. Full range of motion in upper and lower extremities. Monica [name] is limited with cervical ROM in all planes. Has more pain with extension than flexion. Most severe pain is along the left neck. Cervcial parapsinal muscles are tight bilaterally left more than right.substantial head forward position today. Facet maneuvers positive left greater than right. Monica [name] has substantial pain with palpation over the left mid neck which tends to reproduce her pain along with extension in that direction.  Neuro: Fine motor movements are normal in both hands.  Sensory is intact and symmetric to light touch, pinprick and proprioception.  DTR in the upper and lower extremity are present and symmetric 2+. No clonus is noted.  Patient arises from chair without difficulty. Tandem walk is stable.  Psych: emotional, tearful today.   Assessment & Plan:   Assessment:  1. 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  2. Posttraumatic stress syndrome.  3. Borderline Personality--hx of  4. Myofascial pain right trap, and supraspinatus to a lesser degree. Facet injury/irritation appears to be the major culprit behind this. Monica [name] has no consistent neurological signs on exam.    PLAN:  1. Continue methadone rx. Will try demerol 50-75mg  qd prn for severe pain in place of hydromorphone.  Asked Monica [name] to follow up with her primary if Monica [name] has continued shortness of breath. I do feel the  sob may be anxiety related  2. Naproxen---to take prn.  3. Continue topamax at full dose. Understands the risk for stones.  4. Will make a referral to Dr. Wynn BankerKirsteins for a MBB at C3-4 on left.    5. Continue k-tape. Aromatherapy. Would like to try cervical traction. Encouraged her to buy her own unit since it was helpful (as Medicare apparently asked for her rented unit back) 6. 25 minutes of face to face patient care time were spent during this visit. All questions were encouraged and answered. I'll see her back after injection.

## 2014-11-29 ENCOUNTER — Other Ambulatory Visit: Payer: Self-pay | Admitting: *Deleted

## 2014-11-29 ENCOUNTER — Telehealth: Payer: Self-pay | Admitting: *Deleted

## 2014-11-29 NOTE — Telephone Encounter (Signed)
Received call from Josette from ?[employer] abou the new medication for [place].  I called back and left message that I had addressed this with a letter and record to Marcelino Dusterarol Murphy, name and number which was supplied by [place].  I refaxed information to Josette as well leaving voicemail for Josette that I was doing this.  Both faxes were confirmed successful.

## 2014-11-29 NOTE — Telephone Encounter (Signed)
Monica BillsStacia called about getting a letter sent to Monica [name] with her workmans comp to get approval for her demerol.  I have written letter and had Dr Riley KillSwartz sign and faxed to [phone] as directed by [facility].

## 2014-11-29 NOTE — Telephone Encounter (Signed)
Recd electronic refill request for METAXALONE 800 mg - take 1 tablet up to 4 times a day as needed for spasms.  Sent in electronically

## 2014-11-30 ENCOUNTER — Telehealth: Payer: Self-pay | Admitting: *Deleted

## 2014-11-30 NOTE — Telephone Encounter (Signed)
Pt says insurance is giving her a hard time regarding the script that she was given for her pain. She asks if there is any other medication that can be prescribed instead. She is also asking if it would be possible to get in sooner to see Dr. Wynn BankerKirsteins for her procedure. She is currently scheduled for 5/19

## 2014-12-01 NOTE — Telephone Encounter (Signed)
[facility] called last night at 7:12 pm and left message requesting a peer to peer review with  Dr Fara ChuteStanley Matthew today.  His number is [phone]

## 2014-12-06 ENCOUNTER — Ambulatory Visit: Payer: Worker's Compensation | Admitting: Physical Medicine & Rehabilitation

## 2014-12-06 ENCOUNTER — Other Ambulatory Visit: Payer: Self-pay | Admitting: Physical Medicine & Rehabilitation

## 2014-12-21 ENCOUNTER — Other Ambulatory Visit: Payer: Self-pay | Admitting: Physical Medicine & Rehabilitation

## 2014-12-22 ENCOUNTER — Encounter: Payer: Worker's Compensation | Attending: Physical Medicine & Rehabilitation | Admitting: Registered Nurse

## 2014-12-22 ENCOUNTER — Encounter: Payer: Self-pay | Admitting: Registered Nurse

## 2014-12-22 VITALS — BP 135/65 | HR 72 | Resp 14

## 2014-12-22 DIAGNOSIS — M791 Myalgia: Secondary | ICD-10-CM | POA: Diagnosis not present

## 2014-12-22 DIAGNOSIS — M5382 Other specified dorsopathies, cervical region: Secondary | ICD-10-CM

## 2014-12-22 DIAGNOSIS — Z79899 Other long term (current) drug therapy: Secondary | ICD-10-CM

## 2014-12-22 DIAGNOSIS — G43919 Migraine, unspecified, intractable, without status migrainosus: Secondary | ICD-10-CM | POA: Insufficient documentation

## 2014-12-22 DIAGNOSIS — M129 Arthropathy, unspecified: Secondary | ICD-10-CM | POA: Diagnosis not present

## 2014-12-22 DIAGNOSIS — M797 Fibromyalgia: Secondary | ICD-10-CM

## 2014-12-22 DIAGNOSIS — M47812 Spondylosis without myelopathy or radiculopathy, cervical region: Secondary | ICD-10-CM

## 2014-12-22 DIAGNOSIS — G43711 Chronic migraine without aura, intractable, with status migrainosus: Secondary | ICD-10-CM

## 2014-12-22 DIAGNOSIS — F4310 Post-traumatic stress disorder, unspecified: Secondary | ICD-10-CM | POA: Insufficient documentation

## 2014-12-22 DIAGNOSIS — F603 Borderline personality disorder: Secondary | ICD-10-CM | POA: Diagnosis not present

## 2014-12-22 DIAGNOSIS — G8929 Other chronic pain: Secondary | ICD-10-CM | POA: Insufficient documentation

## 2014-12-22 DIAGNOSIS — M7918 Myalgia, other site: Secondary | ICD-10-CM

## 2014-12-22 DIAGNOSIS — M542 Cervicalgia: Secondary | ICD-10-CM | POA: Diagnosis not present

## 2014-12-22 DIAGNOSIS — Z5181 Encounter for therapeutic drug level monitoring: Secondary | ICD-10-CM | POA: Diagnosis not present

## 2014-12-22 MED ORDER — METHADONE HCL 10 MG PO TABS: 20.0000 mg | ORAL_TABLET | Freq: Three times a day (TID) | ORAL | Status: DC

## 2014-12-22 MED ORDER — MEPERIDINE HCL 50 MG PO TABS: 50.0000 mg | ORAL_TABLET | Freq: Every day | ORAL | Status: DC | PRN

## 2014-12-22 NOTE — Progress Notes (Signed)
Subjective:    Patient ID: Monica [name], female    DOB: [birth year], 64 y.o.   MRN: [id number]  HPI: Monica [name] is a 64 year old female who returns for follow up for chronic pain and medication refill. She says her pain is located in her head. She rates her pain 7.  Monica [name] is very angry she wasn't given enough medication until her next appoint. Reviewed Dr. Riley KillSwartz note and answered all of her questions. She was given three prescription's of methadone and one prescription of Demerol to accommodate her next appointment with Dr. Riley KillSwartz.   She's schedule for C3- C4 MBB with Dr. Wynn BankerKirsteins on 12/29/2014.   Pain Inventory Average Pain 7 Pain Right Now 7 My pain is constant and burning  In the last 24 hours, has pain interfered with the following? General activity 8 Relation with others 8 Enjoyment of life 10 What TIME of day is your pain at its worst? daytime Sleep (in general) Poor  Pain is worse with: walking and some activites Pain improves with: rest, pacing activities and medication Relief from Meds: 6  Mobility Do you have any goals in this area?  no  Function Do you have any goals in this area?  no  Neuro/Psych No problems in this area  Prior Studies Any changes since last visit?  no  Physicians involved in your care Any changes since last visit?  no   Family History  Problem Relation Age of Onset  . Colon cancer Mother 860   History   Social History  . Marital Status: Married    Spouse Name: N/A  . Number of Children: 3  . Years of Education: N/A   Occupational History  . disabled    Social History Main Topics  . Smoking status: Never Smoker   . Smokeless tobacco: Never Used  . Alcohol Use: No  . Drug Use: No  . Sexual Activity: Yes    Birth Control/ Protection: Post-menopausal   Other Topics Concern  . None   Social History Narrative   Past Surgical History  Procedure Laterality Date  . Colonoscopy  11/17/08    ext hemorrhoids    . Gastro empy study  03/03/10    normal  . Esophagogastroduodenoscopy  04/05/10    [name] (11F),erosive reflux esophagitis, small hiatal hernia,  . Cholecystectomy    . Neck surgery  1998  . Hand surgery    . Breast lumpectomy      left  . Craniotomy      secondary TBI  . Nephrolithotomy  05/13/2012    Procedure: NEPHROLITHOTOMY PERCUTANEOUS;  Surgeon: Marcine MatarStephen Dahlstedt, MD;  Location: [facility];  Service: Urology;  Laterality: Left;     . Finger surgery      removal cyst left pinky   Past Medical History  Diagnosis Date  . Chronic nausea     normal GES   . Hemorrhoids 11/17/08    Colonoscopy Dr Karilyn Cotaehman  . Chronic headaches     Dr Hermelinda MedicusSchwartz [place] [facility]  . PTSD (post-traumatic stress disorder)   . DM (diabetes mellitus)   . Torticollis   . Elevated liver enzymes     hx of, normal 03/2010  . Headache(784.0)   . Migraine without aura, with intractable migraine, so stated, without mention of status migrainosus   . Unspecified musculoskeletal disorders and symptoms referable to neck     cervical/trapezius  . Occipital neuralgia   . Posttraumatic stress disorder   .  Torticollis   . Cervicalgia   . Complication of anesthesia     had problem with local anesthesia-tried to assist Physician  . Renal lithiasis   . Glaucoma   . Seizure     12 yrs ago-med related  . Arthritis    BP 135/65 mmHg  Pulse 72  Resp 14  SpO2 96%  Opioid Risk Score:   Fall Risk Score: Moderate Fall Risk (6-13 points)`1  Depression screen PHQ 2/9  Depression screen PHQ 2/9 05/13/2012  Decreased Interest 0  Down, Depressed, Hopeless 0  PHQ - 2 Score 0     Review of Systems  All other systems reviewed and are negative.      Objective:   Physical Exam  Constitutional: She is oriented to person, place, and time. She appears well-developed and well-nourished.  HENT:  Head: Normocephalic and atraumatic.  Neck: Normal range of motion. Neck supple.  Cardiovascular: Normal rate  and regular rhythm.   Pulmonary/Chest: Effort normal and breath sounds normal.  Musculoskeletal: Normal range of motion.  Neurological: She is alert and oriented to person, place, and time.  Skin: Skin is warm and dry.  Psychiatric: She has a normal mood and affect.  Nursing note and vitals reviewed.         Assessment & Plan:  1. History of cervicalgia, facet arthropathy, and chronic daily, intractable migraine \ headaches.  Refilled: Methadone 10 mg two tablets three times a day #180. Three scripts given. Demerol 50 mg take 1.-1.5  Daily as needed for sever pain #45. 2. Posttraumatic stress syndrome. Continue to Monitor 3. Myofascial pain right trap: Continue Current Medication regime. Continue with exercise therapy. Continue with K-tapping.  20 minutes of face to face patient care time was spent during this visit. All questions were encouraged and answered.

## 2014-12-29 ENCOUNTER — Ambulatory Visit (HOSPITAL_BASED_OUTPATIENT_CLINIC_OR_DEPARTMENT_OTHER): Payer: Worker's Compensation | Admitting: Physical Medicine & Rehabilitation

## 2014-12-29 ENCOUNTER — Encounter: Payer: Self-pay | Admitting: Physical Medicine & Rehabilitation

## 2014-12-29 VITALS — BP 139/68 | HR 60 | Resp 14

## 2014-12-29 DIAGNOSIS — M47812 Spondylosis without myelopathy or radiculopathy, cervical region: Secondary | ICD-10-CM | POA: Diagnosis not present

## 2014-12-29 DIAGNOSIS — G8929 Other chronic pain: Secondary | ICD-10-CM | POA: Diagnosis not present

## 2014-12-29 DIAGNOSIS — M5382 Other specified dorsopathies, cervical region: Secondary | ICD-10-CM

## 2014-12-29 NOTE — Progress Notes (Signed)
Cervical medial branch blocks under fluoroscopic guidance  Indication: left Cervical axial pain without radiculitis. Pain is unresponsive to conservative care and interferes with activities of daily living.  Informed consent was obtained after describing risks and benefits of the procedure with the patient this includes bleeding bruising and infection temporary or permanent paralysis. Patient elected to proceed and has given written consent. Patient placed in a lateral decubitus position. Fluoroscopic was suggested to insure adequate view of the articular pillars in a lateral view. Area was marked and prepped with Betadine. 1/2 cc of 1% lidocaine injected into the skin and subcutaneous tissue using a 27-gauge 0.5 inch needle. Then a 25-gauge 1.5 inch needle was inserted under fluoroscopic guidance first targeting the C4 articular pillar center. Bone contact made and confirmed under AP imaging. Then 0.5 mL of 1% MPF lidocaine injected. This same procedure was repeated at C3 using same technique. Patient tolerated the procedure well. Post procedure instructions given. Adequate postoperative monitoring was performed. See flowsheet

## 2014-12-29 NOTE — Progress Notes (Signed)
  PROCEDURE RECORD  Physical Medicine and Rehabilitation   Name: Monica [name] DOB:[birth year] MRN: [id number]  Date:12/29/2014  Physician: Claudette LawsAndrew Kirsteins, MD    Nurse/CMA: Ashok Sawaya   Allergies:  Allergies  Allergen Reactions  . Hydromorphone Hcl Shortness Of Breath    severe  . Aspirin Itching    REACTION: Hives, slurred speech, blurred vision  . Capsaicin     Red rash  . Depakote [Divalproex Sodium]   . Milnacipran Hcl Other (See Comments)    dizziness  . Pristiq [Desvenlafaxine Succinate Monohydrate] Other (See Comments)    dizziness  . Zanaflex [Tizanidine Hydrochloride] Other (See Comments)    dizzy    Consent Signed: Yes.    Is patient diabetic? Yes.    CBG today? .  Pregnant: No. LMP: No LMP recorded. Patient is postmenopausal. (age 64-55)  Anticoagulants: no Anti-inflammatory: no Antibiotics: no  Procedure: MBB Cervical C3-4  Position: Left Lateral Start Time:  9:59 End Time:   Fluoro Time:    RN/CMA Tedd Cottrill Waqas Bruhl    Time 9:38am 10:07    BP 138/60 146/56    Pulse 60 56    Respirations 14 14    O2 Sat 98 98    S/S 6 6    Pain Level 7/10  5/10     D/C home with Husband, patient A & O X 3, D/C instructions reviewed, and sits independently.

## 2014-12-29 NOTE — Patient Instructions (Signed)
We performed cervical medial branch blocks today. This is to help block the nerves that carry pain signals from the painful arthritic neck joints. Please keep track of your pain for the next 24-48 hours. Report back during her clinic visit next month. If there was a greater than 50% relief even short-term did be worth repeating this injection when it wears off.  He may have some dizziness related to this injection but this should improve over the next day or 2.

## 2015-01-05 ENCOUNTER — Telehealth: Payer: Self-pay | Admitting: *Deleted

## 2015-01-05 NOTE — Telephone Encounter (Signed)
[facility] ??? Rx is written for 180 tablets.  I called workers comp - Marcelino Dusterarol Murphy.  She stated that she has not received any notices about the medications or any problems with the prescription for the Methadone.  I called [facility] back and spoke with Herbert SetaHeather again - and explained what Marcelino DusterCarol Murphy said and gave the pharmacy tech the information.  The pharmacy is going to call back and try to get this resolved

## 2015-01-06 ENCOUNTER — Other Ambulatory Visit: Payer: Self-pay | Admitting: Physical Medicine & Rehabilitation

## 2015-02-24 ENCOUNTER — Encounter
Payer: Worker's Compensation | Attending: Physical Medicine & Rehabilitation | Admitting: Physical Medicine & Rehabilitation

## 2015-02-24 ENCOUNTER — Other Ambulatory Visit: Payer: Self-pay | Admitting: Physical Medicine & Rehabilitation

## 2015-02-24 ENCOUNTER — Encounter: Payer: Self-pay | Admitting: Physical Medicine & Rehabilitation

## 2015-02-24 VITALS — BP 168/86 | HR 58 | Resp 14

## 2015-02-24 DIAGNOSIS — F4310 Post-traumatic stress disorder, unspecified: Secondary | ICD-10-CM | POA: Insufficient documentation

## 2015-02-24 DIAGNOSIS — M129 Arthropathy, unspecified: Secondary | ICD-10-CM | POA: Diagnosis not present

## 2015-02-24 DIAGNOSIS — Z5181 Encounter for therapeutic drug level monitoring: Secondary | ICD-10-CM | POA: Diagnosis not present

## 2015-02-24 DIAGNOSIS — M542 Cervicalgia: Secondary | ICD-10-CM | POA: Insufficient documentation

## 2015-02-24 DIAGNOSIS — G8929 Other chronic pain: Secondary | ICD-10-CM | POA: Insufficient documentation

## 2015-02-24 DIAGNOSIS — M47812 Spondylosis without myelopathy or radiculopathy, cervical region: Secondary | ICD-10-CM

## 2015-02-24 DIAGNOSIS — G43711 Chronic migraine without aura, intractable, with status migrainosus: Secondary | ICD-10-CM

## 2015-02-24 DIAGNOSIS — M797 Fibromyalgia: Secondary | ICD-10-CM

## 2015-02-24 DIAGNOSIS — Z79899 Other long term (current) drug therapy: Secondary | ICD-10-CM

## 2015-02-24 DIAGNOSIS — G43919 Migraine, unspecified, intractable, without status migrainosus: Secondary | ICD-10-CM | POA: Insufficient documentation

## 2015-02-24 DIAGNOSIS — G894 Chronic pain syndrome: Secondary | ICD-10-CM

## 2015-02-24 DIAGNOSIS — M7918 Myalgia, other site: Secondary | ICD-10-CM

## 2015-02-24 DIAGNOSIS — M791 Myalgia: Secondary | ICD-10-CM | POA: Diagnosis not present

## 2015-02-24 DIAGNOSIS — M5382 Other specified dorsopathies, cervical region: Secondary | ICD-10-CM

## 2015-02-24 DIAGNOSIS — F603 Borderline personality disorder: Secondary | ICD-10-CM | POA: Insufficient documentation

## 2015-02-24 MED ORDER — METHADONE HCL 10 MG PO TABS: 20.0000 mg | ORAL_TABLET | Freq: Three times a day (TID) | ORAL | Status: DC

## 2015-02-24 MED ORDER — MEPERIDINE HCL 50 MG PO TABS: 50.0000 mg | ORAL_TABLET | Freq: Every day | ORAL | Status: DC | PRN

## 2015-02-24 NOTE — Progress Notes (Signed)
Subjective:    Patient ID: Monica [name], female    DOB: Mar 05, 1951, 64 y.o.   MRN: [id number]  HPI   Monica [name] is here in follow up of her chronic pain. She had cervical MBB's in May at C3-4.  For the first month her cervical and occipital pain pain almost completely resolved! Since then it's gradually increased but never back to its prior level.   She continues with her k-tape and aromatherapy which are beneficial for headaches and right shoulder pain.   She continues on her methadone for pain relief. Back in April we changed her to demerol for severe migraine headaches---she is taking one every 3 days on average---it has been quite effective.      Pain Inventory Average Pain 8 Pain Right Now 6 My pain is constant, sharp, burning, stabbing and aching  In the last 24 hours, has pain interfered with the following? General activity 5 Relation with others 5 Enjoyment of life 5 What TIME of day is your pain at its worst? morning and daytime Sleep (in general) Poor  Pain is worse with: walking, bending and some activites Pain improves with: rest, heat/ice, pacing activities and medication Relief from Meds: 5  Mobility walk without assistance how many minutes can you walk? 15 ability to climb steps?  yes do you drive?  yes  Function disabled: date disabled .  Neuro/Psych No problems in this area  Prior Studies Any changes since last visit?  no  Physicians involved in your care Any changes since last visit?  no   Family History  Problem Relation Age of Onset  . Colon cancer Mother 3560   History   Social History  . Marital Status: Married    Spouse Name: N/A  . Number of Children: 3  . Years of Education: N/A   Occupational History  . disabled    Social History Main Topics  . Smoking status: Never Smoker   . Smokeless tobacco: Never Used  . Alcohol Use: No  . Drug Use: No  . Sexual Activity: Yes    Birth Control/ Protection: Post-menopausal   Other  Topics Concern  . None   Social History Narrative   Past Surgical History  Procedure Laterality Date  . Colonoscopy  11/17/08    ext hemorrhoids  . Gastro empy study  03/03/10    normal  . Esophagogastroduodenoscopy  04/05/10    [name] (14F),erosive reflux esophagitis, small hiatal hernia,  . Cholecystectomy    . Neck surgery  1998  . Hand surgery    . Breast lumpectomy      left  . Craniotomy      secondary TBI  . Nephrolithotomy  05/13/2012    Procedure: NEPHROLITHOTOMY PERCUTANEOUS;  Surgeon: Monica MatarStephen Dahlstedt, MD;  Location: [facility];  Service: Urology;  Laterality: Left;     . Finger surgery      removal cyst left pinky   Past Medical History  Diagnosis Date  . Chronic nausea     normal GES   . Hemorrhoids 11/17/08    Colonoscopy Dr Karilyn Cotaehman  . Chronic headaches     Dr Hermelinda MedicusSchwartz [place] [facility]  . PTSD (post-traumatic stress disorder)   . DM (diabetes mellitus)   . Torticollis   . Elevated liver enzymes     hx of, normal 03/2010  . Headache(784.0)   . Migraine without aura, with intractable migraine, so stated, without mention of status migrainosus   . Unspecified musculoskeletal disorders and symptoms  referable to neck     cervical/trapezius  . Occipital neuralgia   . Posttraumatic stress disorder   . Torticollis   . Cervicalgia   . Complication of anesthesia     had problem with local anesthesia-tried to assist Physician  . Renal lithiasis   . Glaucoma   . Seizure     12 yrs ago-med related  . Arthritis    BP 168/86 mmHg  Pulse 58  Resp 14  SpO2 99%  Opioid Risk Score:   Fall Risk Score: Moderate Fall Risk (6-13 points)`1  Depression screen PHQ 2/9  Depression screen PHQ 2/9 05/13/2012  Decreased Interest 0  Down, Depressed, Hopeless 0  PHQ - 2 Score 0     Review of Systems  Constitutional: Negative.   HENT: Negative.   Eyes: Negative.   Respiratory: Negative.   Cardiovascular: Negative.   Gastrointestinal: Negative.     Endocrine: Negative.   Genitourinary: Negative.   Musculoskeletal: Positive for myalgias, back pain and arthralgias.  Skin: Negative.   Allergic/Immunologic: Negative.   Neurological: Negative.   Hematological: Negative.   Psychiatric/Behavioral: Negative.        Objective:   Physical Exam  normal motor tone is noted in both LE's . DTR's appear to be 1+ to 2+ throughout. Muscle testing reveals 5/5 muscle strength of the upper extremity, and 5/5 of the lower extremity. Full range of motion in upper and lower extremities. She is limited with cervical ROM in all planes. Has more pain with extension than flexion. Most severe pain is along the left neck--but this has improved. Cervcial parapsinal muscles are tight bilaterally left more than right. Improved posture today. Facet maneuvers positive left greater than right. She has substantial pain with palpation over the left mid neck which tends to reproduce her pain along with extension in that direction.  Neuro: Fine motor movements are normal in both hands.  Sensory is intact and symmetric to light touch, pinprick and proprioception.  DTR in the upper and lower extremity are present and symmetric 2+. No clonus is noted.  Patient arises from chair without difficulty. Tandem walk is stable.  Psych: emotional, tearful today.    Assessment & Plan:   Assessment:  1. 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  2. Posttraumatic stress syndrome.  3. Borderline Personality--hx of  4. Myofascial pain right trap, and supraspinatus to a lesser degree. Facet injury/irritation appears to be the major culprit behind this. She has no consistent neurological signs on exam.     PLAN:  1. Continue methadone rx. Continue demerol 50 mg qd prn for severe pain.   2. Naproxen---to take prn.  3. Continue topamax at full dose. Understands the risk for stones.  4. Will make a  referral to [facility] for RFs  at C3-4 on left.  5. Continue k-tape. Aromatherapy. Would like to try cervical traction. Encouraged her to buy her own unit since it was helpful (as Medicare apparently asked for her rented unit back)  6. 15 minutes of face to face patient care time were spent during this visit. All questions were encouraged and answered. I'll see her back after injection.

## 2015-02-24 NOTE — Patient Instructions (Signed)
PLEASE CALL ME WITH ANY PROBLEMS OR QUESTIONS (#[phone]).  HAVE A GOOD DAY!    

## 2015-02-25 LAB — PMP ALCOHOL METABOLITE (ETG): ETGU: NEGATIVE ng/mL

## 2015-03-01 LAB — METHADONE (GC/LC/MS), URINE
EDDPUC: 16141 ng/mL (ref <100)
Methadone (GC/LC/MS), ur confirm: 8640 ng/mL (ref <100)

## 2015-03-01 LAB — MEPERIDINE (GC/LC/MS), URINE: Normeperidine (GC/LC/MS), ur confirm: >2500 ng/mL — AB (ref <100)

## 2015-03-02 LAB — PRESCRIPTION MONITORING PROFILE (SOLSTAS)
AMPHETAMINE/METH: NEGATIVE ng/mL
Barbiturate Screen, Urine: NEGATIVE ng/mL
Benzodiazepine Screen, Urine: NEGATIVE ng/mL
Buprenorphine, Urine: NEGATIVE ng/mL
CANNABINOID SCRN UR: NEGATIVE ng/mL
COCAINE METABOLITES: NEGATIVE ng/mL
Carisoprodol, Urine: NEGATIVE ng/mL
Creatinine, Urine: 142.12 mg/dL (ref >20.0)
ECSTASY: NEGATIVE ng/mL
Fentanyl, Ur: NEGATIVE ng/mL
NITRITES URINE, INITIAL: NEGATIVE ug/mL
OPIATE SCREEN, URINE: NEGATIVE ng/mL
Oxycodone Screen, Ur: NEGATIVE ng/mL
Propoxyphene: NEGATIVE ng/mL
TAPENTADOLUR: NEGATIVE ng/mL
TRAMADOL UR: NEGATIVE ng/mL
Zolpidem, Urine: NEGATIVE ng/mL
pH, Initial: 5.3 pH (ref 4.5–8.9)

## 2015-03-10 NOTE — Progress Notes (Signed)
Urine drug screen for this encounter is consistent for prescribed medication 

## 2015-03-14 ENCOUNTER — Other Ambulatory Visit: Payer: Self-pay | Admitting: Physical Medicine & Rehabilitation

## 2015-03-15 ENCOUNTER — Telehealth: Payer: Self-pay | Admitting: *Deleted

## 2015-03-15 NOTE — Telephone Encounter (Signed)
Para [facility] called with many questions about Monica [name]'s medication. She is questioning the high amounts of medication that Monica [name] is taking and if she is under care of mental health, and does she need to be followed more closely by them.  I answered the questions about the narcotics.  She is no longer taking hydromorphone and that was a decision Monica [name] made because of feeling it was affecting her breathing.  The Demerol has been prescribed in its place.  At one point they were considering Demerol stronger that hydromorphone. I assured them that from our standpoint Monica [name] is being visably observed by an MD or NP every two months and drug tested periodically, and that she is compliant with her medications and consistent with her urine drug screens. I could not answer her questions about phsychiatric care.  She was questioning if Monica [name] would be better served with an addiction specialist. I explained that at this time Dr Monica [name] was out of the office until Monday 03/13/15

## 2015-03-15 NOTE — Telephone Encounter (Signed)
I have to ask you to ok refill on this medication as it has not been mentioned in notes in the past year or ordered by you.  [facility] has called questioning medications and treatment.

## 2015-03-22 ENCOUNTER — Other Ambulatory Visit: Payer: Self-pay | Admitting: Physical Medicine & Rehabilitation

## 2015-03-22 NOTE — Telephone Encounter (Signed)
This has not been ordered since Sept 2015.  Do you want to refill?

## 2015-04-25 ENCOUNTER — Other Ambulatory Visit: Payer: Self-pay | Admitting: Physical Medicine & Rehabilitation

## 2015-04-26 ENCOUNTER — Telehealth: Payer: Self-pay | Admitting: *Deleted

## 2015-04-26 ENCOUNTER — Encounter
Payer: Worker's Compensation | Attending: Physical Medicine & Rehabilitation | Admitting: Physical Medicine & Rehabilitation

## 2015-04-26 ENCOUNTER — Encounter: Payer: Self-pay | Admitting: Physical Medicine & Rehabilitation

## 2015-04-26 VITALS — BP 134/82 | HR 55 | Resp 14

## 2015-04-26 DIAGNOSIS — G43711 Chronic migraine without aura, intractable, with status migrainosus: Secondary | ICD-10-CM | POA: Diagnosis not present

## 2015-04-26 DIAGNOSIS — M7918 Myalgia, other site: Secondary | ICD-10-CM

## 2015-04-26 DIAGNOSIS — M47812 Spondylosis without myelopathy or radiculopathy, cervical region: Secondary | ICD-10-CM

## 2015-04-26 DIAGNOSIS — G43919 Migraine, unspecified, intractable, without status migrainosus: Secondary | ICD-10-CM | POA: Diagnosis not present

## 2015-04-26 DIAGNOSIS — M129 Arthropathy, unspecified: Secondary | ICD-10-CM | POA: Insufficient documentation

## 2015-04-26 DIAGNOSIS — G8929 Other chronic pain: Secondary | ICD-10-CM | POA: Diagnosis not present

## 2015-04-26 DIAGNOSIS — M791 Myalgia: Secondary | ICD-10-CM | POA: Diagnosis not present

## 2015-04-26 DIAGNOSIS — M5382 Other specified dorsopathies, cervical region: Secondary | ICD-10-CM | POA: Diagnosis not present

## 2015-04-26 DIAGNOSIS — F603 Borderline personality disorder: Secondary | ICD-10-CM | POA: Diagnosis not present

## 2015-04-26 DIAGNOSIS — F4310 Post-traumatic stress disorder, unspecified: Secondary | ICD-10-CM | POA: Insufficient documentation

## 2015-04-26 DIAGNOSIS — M797 Fibromyalgia: Secondary | ICD-10-CM

## 2015-04-26 DIAGNOSIS — M542 Cervicalgia: Secondary | ICD-10-CM | POA: Diagnosis not present

## 2015-04-26 MED ORDER — MEPERIDINE HCL 50 MG PO TABS: 50.0000 mg | ORAL_TABLET | Freq: Every day | ORAL | Status: DC | PRN

## 2015-04-26 MED ORDER — METAXALONE 800 MG PO TABS: ORAL_TABLET | ORAL | Status: DC

## 2015-04-26 MED ORDER — PROMETHAZINE HCL 25 MG PO TABS: 25.0000 mg | ORAL_TABLET | Freq: Two times a day (BID) | ORAL | Status: DC | PRN

## 2015-04-26 MED ORDER — METHADONE HCL 10 MG PO TABS: 20.0000 mg | ORAL_TABLET | Freq: Three times a day (TID) | ORAL | Status: DC

## 2015-04-26 NOTE — Telephone Encounter (Signed)
We can find no record of the actual referral being made for the cervical RF to [place] [facility].

## 2015-04-26 NOTE — Progress Notes (Signed)
Subjective:    Patient ID: Monica [name], fDereck Leep: December 01, 1950, 64 y.o.   MRN: [id number]  HPI  Monica [name] is here in follow up of her chronic pain. She hasn't had the cervical RF's. I'm not sure if it is an insurance issue or not. She had excellent results with the MBB's earlier this year. She just got back from a beach trip with her family which was fun but also taxing in regard to her headaches and persistent stimulation.   She finds that the methadone and demerol provide her relief. The imitrex is not helping much at this point and tends to cause her nausea. We had used the imitrex injections in the past with good results but I believe they were stopped due to lack of covg.   Monica [name] tries to stay as active as she can at home although she is limited mostly due to her pain.       Pain Inventory Average Pain 9 Pain Right Now 7 My pain is constant and sharp  In the last 24 hours, has pain interfered with the following? General activity 9 Relation with others 9 Enjoyment of life 9 What TIME of day is your pain at its worst? daytime Sleep (in general) Fair  Pain is worse with: some activites Pain improves with: rest and medication Relief from Meds: 4  Mobility walk without assistance  Function disabled: date disabled .  Neuro/Psych dizziness  Prior Studies Any changes since last visit?  no  Physicians involved in your care Any changes since last visit?  no   Family History  Problem Relation Age of Onset  . Colon cancer Mother 60   Social History   Social History  . Marital Status: Married    Spouse Name: N/A  . Number of Children: 3  . Years of Education: N/A   Occupational History  . disabled    Social History Main Topics  . Smoking status: Never Smoker   . Smokeless tobacco: Never Used  . Alcohol Use: No  . Drug Use: No  . Sexual Activity: Yes    Birth Control/ Protection: Post-menopausal   Other Topics Concern  . None   Social History  Narrative   Past Surgical History  Procedure Laterality Date  . Colonoscopy  11/17/08    ext hemorrhoids  . Gastro empy study  03/03/10    normal  . Esophagogastroduodenoscopy  04/05/10    [name] (39F),erosive reflux esophagitis, small hiatal hernia,  . Cholecystectomy    . Neck surgery  1998  . Hand surgery    . Breast lumpectomy      left  . Craniotomy      secondary TBI  . Nephrolithotomy  05/13/2012    Procedure: NEPHROLITHOTOMY PERCUTANEOUS;  Surgeon: Marcine Matar, MD;  Location: [facility];  Service: Urology;  Laterality: Left;     . Finger surgery      removal cyst left pinky   Past Medical History  Diagnosis Date  . Chronic nausea     normal GES   . Hemorrhoids 11/17/08    Colonoscopy Dr Karilyn Cota  . Chronic headaches     Dr Hermelinda Medicus [place] [facility]  . PTSD (post-traumatic stress disorder)   . DM (diabetes mellitus)   . Torticollis   . Elevated liver enzymes     hx of, normal 03/2010  . Headache(784.0)   . Migraine without aura, with intractable migraine, so stated, without mention of status migrainosus   .  Unspecified musculoskeletal disorders and symptoms referable to neck     cervical/trapezius  . Occipital neuralgia   . Posttraumatic stress disorder   . Torticollis   . Cervicalgia   . Complication of anesthesia     had problem with local anesthesia-tried to assist Physician  . Renal lithiasis   . Glaucoma   . Seizure     12 yrs ago-med related  . Arthritis    BP 134/82 mmHg  Pulse 55  Resp 14  SpO2 99%  Opioid Risk Score:   Fall Risk Score:  `1  Depression screen PHQ 2/9  Depression screen [facility] 2/9 02/24/2015 05/13/2012  Decreased Interest 2 0  Down, Depressed, Hopeless 1 0  PHQ - 2 Score 3 0  Altered sleeping 1 -  Tired, decreased energy 1 -  Change in appetite 3 -  Feeling bad or failure about yourself  1 -  Trouble concentrating 2 -  Moving slowly or fidgety/restless 0 -  Suicidal thoughts 0 -  PHQ-9 Score 11 -      Review of Systems  Gastrointestinal: Positive for nausea.  Neurological: Positive for dizziness.  All other systems reviewed and are negative.      Objective:   Physical Exam  normal motor tone is noted in both LE's . DTR's appear to be 1+ to 2+ throughout. Muscle testing reveals 5/5 muscle strength of the upper extremity, and 5/5 of the lower extremity. Full range of motion in upper and lower extremities. She is limited with cervical ROM in all planes. Has more pain with extension than flexion. Most severe pain is along the left neck--but this has improved. Cervcial parapsinal muscles are tight bilaterally left more than right. Improved posture today. Facet maneuvers positive left greater than right. She has substantial pain with palpation over the left mid neck which tends to reproduce her pain along with extension in that direction.  Neuro: Fine motor movements are normal in both hands.  Sensory is intact and symmetric to light touch, pinprick and proprioception.  DTR in the upper and lower extremity are present and symmetric 2+. No clonus is noted.  Patient arises from chair without difficulty. Tandem walk is stable.  Psych: emotional, tearful today.   Assessment & Plan:   Assessment:  1. 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  2. Posttraumatic stress syndrome.  3. Borderline Personality--hx of  4. Myofascial pain right trap, and supraspinatus to a lesser degree. Facet injury/irritation appears to be a trigger for this.     PLAN:  1. Continue methadone rx. Continue demerol 50 mg qd prn for severe pain. Second rx's were given for next month 2. Naproxen-- prn.   3. Continue topamax at full dose. She is willing to accept the potential risk for stones.  4. Will follow up refearrral to [facility] for RFs at C3-4 on left. I have spoken to our front office 5. Continue k-tape and  aromatherapy. Cervical traction also  6. 15 minutes of face to face patient care time were spent during this visit. All questions were encouraged and answered. I'll see her back in about 2 months.

## 2015-04-26 NOTE — Patient Instructions (Signed)
CHECK WITH INSURANCE REGARDING COVERAGE OF IMITREX INJECTIONS FOR MIGRAINE HEADACHES.

## 2015-06-13 ENCOUNTER — Encounter: Payer: Self-pay | Admitting: Pain Medicine

## 2015-06-13 ENCOUNTER — Ambulatory Visit: Payer: Worker's Compensation | Attending: Pain Medicine | Admitting: Pain Medicine

## 2015-06-13 VITALS — BP 148/60 | HR 51 | Temp 98.6°F | Resp 18 | Ht 64.0 in | Wt 143.0 lb

## 2015-06-13 DIAGNOSIS — K219 Gastro-esophageal reflux disease without esophagitis: Secondary | ICD-10-CM | POA: Insufficient documentation

## 2015-06-13 DIAGNOSIS — F329 Major depressive disorder, single episode, unspecified: Secondary | ICD-10-CM | POA: Insufficient documentation

## 2015-06-13 DIAGNOSIS — Z79891 Long term (current) use of opiate analgesic: Secondary | ICD-10-CM | POA: Insufficient documentation

## 2015-06-13 DIAGNOSIS — M542 Cervicalgia: Secondary | ICD-10-CM

## 2015-06-13 DIAGNOSIS — G4486 Cervicogenic headache: Secondary | ICD-10-CM

## 2015-06-13 DIAGNOSIS — F603 Borderline personality disorder: Secondary | ICD-10-CM | POA: Insufficient documentation

## 2015-06-13 DIAGNOSIS — M546 Pain in thoracic spine: Secondary | ICD-10-CM | POA: Insufficient documentation

## 2015-06-13 DIAGNOSIS — M47812 Spondylosis without myelopathy or radiculopathy, cervical region: Secondary | ICD-10-CM | POA: Diagnosis not present

## 2015-06-13 DIAGNOSIS — R51 Headache: Secondary | ICD-10-CM

## 2015-06-13 DIAGNOSIS — G43909 Migraine, unspecified, not intractable, without status migrainosus: Secondary | ICD-10-CM | POA: Diagnosis not present

## 2015-06-13 DIAGNOSIS — G8929 Other chronic pain: Secondary | ICD-10-CM | POA: Insufficient documentation

## 2015-06-13 DIAGNOSIS — F1120 Opioid dependence, uncomplicated: Secondary | ICD-10-CM | POA: Insufficient documentation

## 2015-06-13 DIAGNOSIS — F1190 Opioid use, unspecified, uncomplicated: Secondary | ICD-10-CM

## 2015-06-13 MED ORDER — DEXAMETHASONE SODIUM PHOSPHATE 10 MG/ML IJ SOLN
INTRAMUSCULAR | Status: AC
  Filled 2015-06-13: qty 1

## 2015-06-13 MED ORDER — LACTATED RINGERS IV SOLN: 1000.0000 mL | INTRAVENOUS | Status: AC

## 2015-06-13 MED ORDER — ROPIVACAINE HCL 2 MG/ML IJ SOLN
INTRAMUSCULAR | Status: AC
  Filled 2015-06-13: qty 10

## 2015-06-13 MED ORDER — SODIUM CHLORIDE 0.9 % IJ SOLN
INTRAMUSCULAR | Status: AC
  Filled 2015-06-13: qty 10

## 2015-06-13 MED ORDER — FENTANYL CITRATE (PF) 100 MCG/2ML IJ SOLN
100.0000 ug | Freq: Once | INTRAMUSCULAR | Status: AC
  Administered 2015-06-13: 50 ug via INTRAVENOUS

## 2015-06-13 MED ORDER — FENTANYL CITRATE (PF) 100 MCG/2ML IJ SOLN
INTRAMUSCULAR | Status: AC
  Administered 2015-06-13: 50 ug via INTRAVENOUS
  Filled 2015-06-13: qty 2

## 2015-06-13 MED ORDER — MIDAZOLAM HCL 5 MG/5ML IJ SOLN
INTRAMUSCULAR | Status: AC
  Administered 2015-06-13: 1 mg via INTRAVENOUS
  Filled 2015-06-13: qty 5

## 2015-06-13 MED ORDER — LIDOCAINE HCL (PF) 1 % IJ SOLN
10.0000 mL | Freq: Once | INTRAMUSCULAR | Status: AC
Start: 2015-06-13 — End: 2015-06-13
  Administered 2015-06-13: 12:00:00

## 2015-06-13 MED ORDER — MIDAZOLAM HCL 5 MG/5ML IJ SOLN
5.0000 mg | Freq: Once | INTRAMUSCULAR | Status: AC
  Administered 2015-06-13: 1 mg via INTRAVENOUS

## 2015-06-13 MED ORDER — LIDOCAINE HCL (PF) 1 % IJ SOLN
INTRAMUSCULAR | Status: AC
  Filled 2015-06-13: qty 5

## 2015-06-13 MED ORDER — DEXAMETHASONE SODIUM PHOSPHATE 10 MG/ML IJ SOLN
10.0000 mg | Freq: Once | INTRAMUSCULAR | Status: AC
  Administered 2015-06-13: 12:00:00

## 2015-06-13 MED ORDER — ROPIVACAINE HCL 2 MG/ML IJ SOLN
9.0000 mL | Freq: Once | INTRAMUSCULAR | Status: AC
  Administered 2015-06-13: 12:00:00

## 2015-06-13 NOTE — Progress Notes (Signed)
Safety precautions to be maintained throughout the outpatient stay will include: orient to surroundings, keep bed in low position, maintain call bell within reach at all times, provide assistance with transfer out of bed and ambulation.  

## 2015-06-13 NOTE — Patient Instructions (Addendum)
Radiofrequency Lesioning Radiofrequency lesioning is a procedure that is performed to relieve pain. The procedure is often used for back, neck, or arm pain. Radiofrequency lesioning involves the use of a machine that creates radio waves to make heat. During the procedure, the heat is applied to the nerve that carries the pain signal. The heat damages the nerve and interferes with the pain signal. Pain relief usually lasts for 6 months to 1 year. LET [facility] CARE PROVIDER KNOW ABOUT:  Any allergies you have.  All medicines you are taking, including vitamins, herbs, eye drops, creams, and over-the-counter medicines.  Previous problems you or members of your family have had with the use of anesthetics.  Any blood disorders you have.  Previous surgeries you have had.  Any medical conditions you have.  Whether you are pregnant or may be pregnant. RISKS AND COMPLICATIONS Generally, this is a safe procedure. However, problems may occur, including:  Pain or soreness at the injection site.  Infection at the injection site.  Damage to nerves or blood vessels. BEFORE THE PROCEDURE  Ask your health care provider about:  Changing or stopping your regular medicines. This is especially important if you are taking diabetes medicines or blood thinners.  Taking medicines such as aspirin and ibuprofen. These medicines can thin your blood. Do not take these medicines before your procedure if your health care provider instructs you not to.  Follow instructions from your health care provider about eating or drinking restrictions.  Plan to have someone take you home after the procedure.  If you go home right after the procedure, plan to have someone with you for 24 hours. PROCEDURE  You will be given one or more of the following:  A medicine to help you relax (sedative).  A medicine to numb the area (local anesthetic).  You will be awake during the procedure. You will need to be able to  talk with the health care provider during the procedure.  With the help of a type of X-ray (fluoroscopy), the health care provider will insert a radiofrequency needle into the area to be treated.  Next, a wire that carries the radio waves (electrode) will be put through the radiofrequency needle. An electrical pulse will be sent through the electrode to verify the correct nerve. You will feel a tingling sensation, and you may have muscle twitching.  Then, the tissue that is around the needle tip will be heated by an electric current that is passed using the radiofrequency machine. This will numb the nerves.  A bandage (dressing) will be put on the insertion area after the procedure is done. The procedure may vary among health care providers and hospitals. AFTER THE PROCEDURE  Your blood pressure, heart rate, breathing rate, and blood oxygen level will be monitored often until the medicines you were given have worn off.  Return to your normal activities as directed by your health care provider.   This information is not intended to replace advice given to you by your health care provider. Make sure you discuss any questions you have with your health care provider.   Document Released: 03/27/2011 Document Revised: 04/19/2015 Document Reviewed: 09/05/2014 Elsevier Interactive Patient Education 2016 [employer] Pain Management Discharge Instructions  General Discharge Instructions :  If you need to reach your doctor call: Monday-Friday 8:00 am - 4:00 pm at [phone] or toll free [phone].  After clinic hours [phone] to have operator reach doctor.  Bring all of your medication bottles to all your  appointments in the pain clinic.  To cancel or reschedule your appointment with Pain Management please remember to call 24 hours in advance to avoid a fee.  Refer to the educational materials which you have been given on: General Risks, I had my Procedure. Discharge Instructions,  Post Sedation.  Post Procedure Instructions:  The drugs you were given will stay in your system until tomorrow, so for the next 24 hours you should not drive, make any legal decisions or drink any alcoholic beverages.  You may eat anything you prefer, but it is better to start with liquids then soups and crackers, and gradually work up to solid foods.  Please notify your doctor immediately if you have any unusual bleeding, trouble breathing or pain that is not related to your normal pain.  Depending on the type of procedure that was done, some parts of your body may feel week and/or numb.  This usually clears up by tonight or the next day.  Walk with the use of an assistive device or accompanied by an adult for the 24 hours.  You may use ice on the affected area for the first 24 hours.  Put ice in a Ziploc bag and cover with a towel and place against area 15 minutes on 15 minutes off.  You may switch to heat after 24 hours.Facet Joint Block The facet joints connect the bones of the spine (vertebrae). They make it possible for you to bend, twist, and make other movements with your spine. They also prevent you from overbending, overtwisting, and making other excessive movements.  A facet joint block is a procedure where a numbing medicine (anesthetic) is injected into a facet joint. Often, a type of anti-inflammatory medicine called a steroid is also injected. A facet joint block may be done for two reasons:   Diagnosis. A facet joint block may be done as a test to see whether neck or back pain is caused by a worn-down or infected facet joint. If the pain gets better after a facet joint block, it means the pain is probably coming from the facet joint. If the pain does not get better, it means the pain is probably not coming from the facet joint.   Therapy. A facet joint block may be done to relieve neck or back pain caused by a facet joint. A facet joint block is only done as a therapy if the pain  does not improve with medicine, exercise programs, physical therapy, and other forms of pain management. LET [facility] PROVIDER KNOW ABOUT:   Any allergies you have.   All medicines you are taking, including vitamins, herbs, eyedrops, and over-the-counter medicines and creams.   Previous problems you or members of your family have had with the use of anesthetics.   Any blood disorders you have had.   Other health problems you have. RISKS AND COMPLICATIONS Generally, having a facet joint block is safe. However, as with any procedure, complications can occur. Possible complications associated with having a facet joint block include:   Bleeding.   Injury to a nerve near the injection site.   Pain at the injection site.   Weakness or numbness in areas controlled by nerves near the injection site.   Infection.   Temporary fluid retention.   Allergic reaction to anesthetics or medicines used during the procedure. BEFORE THE PROCEDURE   Follow your health care provider's instructions if you are taking dietary supplements or medicines. You may need to stop taking them  or reduce your dosage.   Do not take any new dietary supplements or medicines without asking your health care provider first.   Follow your health care provider's instructions about eating and drinking before the procedure. You may need to stop eating and drinking several hours before the procedure.   Arrange to have an adult drive you home after the procedure. PROCEDURE  You may need to remove your clothing and dress in an open-back gown so that your health care provider can access your spine.   The procedure will be done while you are lying on an X-ray table. Most of the time you will be asked to lie on your stomach, but you may be asked to lie in a different position if an injection will be made in your neck.   Special machines will be used to monitor your oxygen levels, heart rate, and blood  pressure.   If an injection will be made in your neck, an intravenous (IV) tube will be inserted into one of your veins. Fluids and medicine will flow directly into your body through the IV tube.   The area over the facet joint where the injection will be made will be cleaned with an antiseptic soap. The surrounding skin will be covered with sterile drapes.   An anesthetic will be applied to your skin to make the injection area numb. You may feel a temporary stinging or burning sensation.   A video X-ray machine will be used to locate the joint. A contrast dye may be injected into the facet joint area to help with locating the joint.   When the joint is located, an anesthetic medicine will be injected into the joint through the needle.   Your health care provider will ask you whether you feel pain relief. If you do feel relief, a steroid may be injected to provide pain relief for a longer period of time. If you do not feel relief or feel only partial relief, additional injections of an anesthetic may be made in other facet joints.   The needle will be removed, the skin will be cleansed, and bandages will be applied.  AFTER THE PROCEDURE   You will be observed for 15-30 minutes before being allowed to go home. Do not drive. Have an adult drive you or take a taxi or public transportation instead.   If you feel pain relief, the pain will return in several hours or days when the anesthetic wears off.   You may feel pain relief 2-14 days after the procedure. The amount of time this relief lasts varies from person to person.   It is normal to feel some tenderness over the injected area(s) for 2 days following the procedure.   If you have diabetes, you may have a temporary increase in blood sugar.   This information is not intended to replace advice given to you by your health care provider. Make sure you discuss any questions you have with your health care provider.   Document  Released: 12/18/2006 Document Revised: 08/19/2014 Document Reviewed: 05/18/2012 Elsevier Interactive Patient Education [employer].

## 2015-06-14 ENCOUNTER — Telehealth: Payer: Self-pay | Admitting: *Deleted

## 2015-06-14 NOTE — Telephone Encounter (Signed)
Patient denies any problems after RF.

## 2015-06-15 NOTE — Progress Notes (Signed)
Monica [name]'s Name: Monica Monica [name] MRN: [id number] DOB: 1951/06/27 DOS: 06/13/2015  Primary Reason(s) for Visit: Interventional Pain Management Treatment. CC: Neck Pain I did  Pre-Procedure Assessment: Monica Monica [name] is a 64 y.o. year old, female Monica [name], seen today for interventional treatment. 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 Her primarily concern today is Monica Neck Pain Verification of Monica correct person, correct site (including marking of site), and correct procedure were performed and confirmed by Monica Monica [name]. Today's Vitals   06/13/15 1233 06/13/15 1240 06/13/15 1250 06/13/15 1300  BP: 139/64 158/60 133/47 148/60  Pulse: 57 48 50 51  Temp:      Resp: _0 Height:      Weight:      SpO2: 95% 96% 96% 98%  PainSc: 0-No pain _1 PainLoc:      Calculated BMI: Body mass index is 24.53 kg/(m^2). Allergies: She is allergic to hydromorphone hcl; aspirin; capsaicin; depakote; milnacipran hcl; pristiq; and zanaflex.. Primary Diagnosis: Cervical spondylosis [M47.812]  Procedure: Type: Therapeutic Medial Branch Facet Radiofrequency Ablation Region: Posterolateral Cervical Level: C3, C4, C5, & C6 Medial Branch Level(s) Laterality: Left-Sided Paraspinal  Indications: Spondylosis, Cervical Region Cervical Facet Syndrome Cervical Spine Pain Neck Pain Upper Back Pain  Consent: Secured.  Under Monica influence of no sedatives a written informed consent was obtained, after having provided information on Monica risks and possible complications. To fulfill our ethical and legal obligations, as recommended by Monica [name], we have provided information to Monica Monica [name] about our clinical impression; Monica nature and purpose of Monica treatment or procedure; Monica risks, benefits, and possible complications of Monica intervention; alternatives; Monica risk(s) and benefit(s) of Monica alternative treatment(s) or procedure(s); and Monica risk(s) and benefit(s) of doing nothing. Monica Monica [name] was provided information about Monica risks and possible complications associated with Monica procedure. These include, but are not limited to, failure to achieve desired goals, infection, bleeding, organ or nerve damage, allergic reactions, paralysis, and death. In Monica case of spinal procedures these may include, but are not limited to, failure to achieve desired goals, infection, bleeding, organ or nerve damage, allergic reactions, paralysis, and death. In addition, Monica Monica [name] was informed that Medicine is not an exact science; therefore, there is also Monica possibility of unforeseen risks and possible complications that may result in a catastrophic outcome. Monica Monica [name] indicated having understood very clearly. We have given Monica Monica [name] no guarantees and we have made no promises. Enough time was given to Monica Monica [name] to ask questions, all of which were answered to Monica Monica [name]'s satisfaction.  Pre-Procedure Preparation: Safety Precautions: Allergies reviewed. Appropriate site, procedure, and Monica [name] were confirmed by following Monica Joint Commission's Universal Protocol ([id number].01.01.01), in Monica form of a "Time Out". Monica Monica [name] was asked to confirm marked site and procedure, before commencing. Monica Monica [name] was asked about blood thinners, or active infections, both of which were denied. Monica [name] was assessed for positional  comfort and all pressure points were checked before starting procedure. Monitoring:  As per clinic protocol.  Infection Control Precautions: Sterile technique used. Standard Universal Precautions were taken as recommended by Monica Department of [facility] for Disease Control and Prevention (CDC). Standard pre-surgical skin prep was conducted. Respiratory hygiene and cough etiquette was practiced. Hand hygiene observed. Safe injection practices and needle disposal techniques followed. SDV (single dose vial) medications used. Medications properly checked for expiration dates and contaminants. Personal protective equipment (PPE) used: Sterile double glove technique. Radiation resistant gloves. Sterile surgical gloves.  Anesthesia, Analgesia, Anxiolysis: Type: Moderate (Conscious) Sedation & Local Anesthesia. Meaningful verbal contact was maintained, with Monica Monica [name] at all times during Monica procedure. Local Anesthetic(s): Lidocaine 1% Route: Intravenous (IV) IV Access: Secured Sedation: Meaningful verbal contact was maintained at all times during Monica procedure. Indication(s): Anxiolysis and Analgesia.  Description of Procedure Process:  Time-out: "Time-out" completed before starting procedure, as per protocol. Position: Prone with head of Monica table was raised to facilitate breathing. Target Area: For Cervical Facet block(s), Monica target is Monica postero-lateral waist of Monica articular pillars at Monica C3, C4, C5, & C6 levels. Approach: Posterior approach. Area Prepped: Entire Posterior Cervical Region Prepping solution: ChloraPrep (2% chlorhexidine gluconate and 70% isopropyl alcohol) Safety Precautions: Aspiration looking for blood return was conducted prior to all injections. At no point did we inject any substances, as a needle was being advanced. Safe injection practices and needle disposal techniques used. Medications properly checked for expiration dates. SDV (single dose vial) medications  used. Description of Monica Procedure: Protocol guidelines were followed. Monica Monica [name] was placed in position over Monica fluoroscopy table. Monica target area was identified and Monica area prepped in Monica usual manner. Skin & deeper tissues infiltrated with local anesthetic. Appropriate amount of time allowed to pass for local anesthetics to take effect. Monica Radiofrequency needles were used to reach Monica area of Monica medial branch at Monica waist of Monica articular pillars using fluoroscopy. Using Monica [employer], sensory stimulation using 50 Hz was used to locate & identify Monica nerve, making sure that Monica needle was positioned such that there was no sensory stimulation below 0.3 V or above 0.7 V. Stimulation using 2 Hz was used to evaluate Monica motor component. Care was taken not to lesion any nerves that demonstrated motor stimulation of Monica lower extremities at an output of less than 2.5 times that of Monica sensory threshold, or a maximum of 2.0 V. Once satisfactory placement of Monica needles was achieved, Monica below described solution was slowly injected after negative aspiration. After waiting for at least 2 minutes, Monica ablation was performed at 80 degrees C for 60 seconds. Monica needles were then removed and Monica area cleansed, making sure to leave some of Monica prepping solution back to take advantage of its long term bactericidal properties. EBL: Minimal Materials & Medications Used:  Needle(s) Used: !8G-10cm Radiofrequency Needle/Trochar Solution Injected: 0.2% PF-Ropivacaine (22m) + SDV-Decadron 10 mg/ml (182m Medications Administered today: We administered ropivacaine (PF) 2 mg/ml (0.2%), fentaNYL, dexamethasone, midazolam, lidocaine (PF), dexamethasone, lidocaine (PF), ropivacaine (PF) 2 mg/ml (0.2%), fentaNYL, and midazolam.Please see chart orders for dosing details.  Imaging Guidance:  Type of Imaging Technique: Fluoroscopy Guidance (Spinal) Indication(s): Assistance in needle guidance and placement  for procedures requiring needle placement in or near specific anatomical locations not easily accessible without such assistance. Exposure Time: Please see nurses notes. Contrast: None required. Fluoroscopic Guidance: I was personally present in Monica fluoroscopy suite, where Monica Monica [name] was placed in position for Monica procedure, over Monica fluoroscopy-compatible table. Fluoroscopy was manipulated, using "Tunnel Vision  Technique", to obtain Monica best possible view of Monica target area, on Monica affected side. Parallax error was corrected before commencing Monica procedure. A "direction-depth-direction" technique was used to introduce Monica needle under continuous pulsed fluoroscopic guidance. Once Monica target was reached, antero-posterior, oblique, and lateral fluoroscopic projection views were taken to confirm needle placement in all planes. Permanently recorded images stored by scanning into EMR. Interpretation: Intraoperative imaging interpretation by performing Physician. Adequate needle placement confirmed. Adequate needle placement confirmed in AP, lateral, & Oblique Views. No contrast injected. Permanent hardcopy images in multiple planes scanned into Monica Monica [name]'s record.  Antibiotics:  Type:  Antibiotics Given (last 72 hours)    None      Indication(s): No indications identified.  Post-operative Assessment:  Complications: No immediate post-treatment complications were observed. Disposition: Return to clinic for follow-up evaluation. Monica Monica [name] tolerated Monica entire procedure well. A repeat set of vitals were taken after Monica procedure and Monica Monica [name] was kept under observation following institutional policy, for this type of procedure. Post-procedural neurological assessment was performed, showing return to baseline, prior to discharge. Monica Monica [name] was discharged home, once institutional criteria were met. Monica Monica [name] was provided with post-procedure discharge instructions, including a section on how to  identify potential problems. Should any problems arise concerning this procedure, Monica Monica [name] was given instructions to immediately contact us, at any time, without hesitation. In any case, we plan to contact Monica Monica [name] by telephone for a follow-up status report regarding this interventional procedure. Comments:  No additional relevant information.  Primary Care Physician: Monico Blitz, MD Location: [address] [facility] Facility Note by: Kathlen Brunswick. Dossie Arbour, M.D, DABA, DABAPM, DABPM, DABIPP, FIPP  Disclaimer:  Medicine is not an exact science. Monica only guarantee in medicine is that nothing is guaranteed. It is important to note that Monica decision to proceed with this intervention was based on Monica information collected from Monica Monica [name]. Monica Data and conclusions were drawn from Monica Monica [name]'s questionnaire, Monica interview, and Monica physical examination. Because Monica information was provided in large part by Monica Monica [name], it cannot be guaranteed that it has not been purposely or unconsciously manipulated. Every effort has been made to obtain as much relevant data as possible for this evaluation. It is important to note that Monica conclusions that lead to this procedure are derived in large part from Monica available data. Always take into account that Monica treatment will also be dependent on availability of resources and existing treatment guidelines, considered by other Pain Management Practitioners as being common knowledge and practice, at Monica time of Monica intervention. For Medico-Legal purposes, it is also important to point out that variation in procedural techniques and pharmacological choices are Monica acceptable norm. Monica indications, contraindications, technique, and results of Monica above procedure should only be interpreted and judged by a Board-Certified Interventional Pain Specialist with extensive familiarity and expertise in Monica same exact procedure and technique. Attempts at providing opinions  without similar or greater experience and expertise than that of Monica treating physician will be considered as inappropriate and unethical, and shall result in a formal complaint to Monica state medical board and applicable specialty societies.

## 2015-06-20 ENCOUNTER — Telehealth: Payer: Self-pay | Admitting: *Deleted

## 2015-06-20 DIAGNOSIS — M7918 Myalgia, other site: Secondary | ICD-10-CM

## 2015-06-20 DIAGNOSIS — M47812 Spondylosis without myelopathy or radiculopathy, cervical region: Secondary | ICD-10-CM

## 2015-06-20 DIAGNOSIS — G43711 Chronic migraine without aura, intractable, with status migrainosus: Secondary | ICD-10-CM

## 2015-06-20 NOTE — Telephone Encounter (Signed)
i am willing to help her out. Remind me tomorrow

## 2015-06-20 NOTE — Telephone Encounter (Signed)
Pt called stating that due to the extra pain she had endured following her cervical radiofrequency nerve lesion [facility] , she has run out of her meperidine.   She is asking for a bridge script to last her till her next visit with Dr. Riley KillSwartz on November 14th. She was apologetic that she had run out of her medication prematurely, but states that the pain has been unbearable and her migraines are still bothering her

## 2015-06-21 MED ORDER — MEPERIDINE HCL 50 MG PO TABS: 50.0000 mg | ORAL_TABLET | Freq: Every day | ORAL | Status: DC | PRN

## 2015-06-21 NOTE — Telephone Encounter (Signed)
Bridge script printed and signed, pt called and informed.  She will come by to get the script today

## 2015-06-26 ENCOUNTER — Encounter
Payer: Worker's Compensation | Attending: Physical Medicine & Rehabilitation | Admitting: Physical Medicine & Rehabilitation

## 2015-06-26 ENCOUNTER — Other Ambulatory Visit: Payer: Self-pay | Admitting: Physical Medicine & Rehabilitation

## 2015-06-26 ENCOUNTER — Encounter: Payer: Self-pay | Admitting: Physical Medicine & Rehabilitation

## 2015-06-26 VITALS — BP 151/67 | HR 66

## 2015-06-26 DIAGNOSIS — Z5181 Encounter for therapeutic drug level monitoring: Secondary | ICD-10-CM | POA: Diagnosis not present

## 2015-06-26 DIAGNOSIS — M797 Fibromyalgia: Secondary | ICD-10-CM

## 2015-06-26 DIAGNOSIS — M542 Cervicalgia: Secondary | ICD-10-CM | POA: Insufficient documentation

## 2015-06-26 DIAGNOSIS — G894 Chronic pain syndrome: Secondary | ICD-10-CM | POA: Diagnosis not present

## 2015-06-26 DIAGNOSIS — Z79899 Other long term (current) drug therapy: Secondary | ICD-10-CM

## 2015-06-26 DIAGNOSIS — M47812 Spondylosis without myelopathy or radiculopathy, cervical region: Secondary | ICD-10-CM

## 2015-06-26 DIAGNOSIS — M791 Myalgia: Secondary | ICD-10-CM | POA: Insufficient documentation

## 2015-06-26 DIAGNOSIS — F603 Borderline personality disorder: Secondary | ICD-10-CM | POA: Diagnosis not present

## 2015-06-26 DIAGNOSIS — F4310 Post-traumatic stress disorder, unspecified: Secondary | ICD-10-CM | POA: Diagnosis not present

## 2015-06-26 DIAGNOSIS — R51 Headache: Secondary | ICD-10-CM

## 2015-06-26 DIAGNOSIS — M5382 Other specified dorsopathies, cervical region: Secondary | ICD-10-CM

## 2015-06-26 DIAGNOSIS — M7918 Myalgia, other site: Secondary | ICD-10-CM

## 2015-06-26 DIAGNOSIS — G43919 Migraine, unspecified, intractable, without status migrainosus: Secondary | ICD-10-CM | POA: Diagnosis not present

## 2015-06-26 DIAGNOSIS — G4486 Cervicogenic headache: Secondary | ICD-10-CM

## 2015-06-26 DIAGNOSIS — M129 Arthropathy, unspecified: Secondary | ICD-10-CM | POA: Insufficient documentation

## 2015-06-26 DIAGNOSIS — G8929 Other chronic pain: Secondary | ICD-10-CM | POA: Diagnosis present

## 2015-06-26 DIAGNOSIS — G43711 Chronic migraine without aura, intractable, with status migrainosus: Secondary | ICD-10-CM

## 2015-06-26 DIAGNOSIS — G43011 Migraine without aura, intractable, with status migrainosus: Secondary | ICD-10-CM

## 2015-06-26 MED ORDER — METHADONE HCL 10 MG PO TABS: 20.0000 mg | ORAL_TABLET | Freq: Three times a day (TID) | ORAL | Status: DC

## 2015-06-26 MED ORDER — MEPERIDINE HCL 50 MG PO TABS: 50.0000 mg | ORAL_TABLET | Freq: Every day | ORAL | Status: DC | PRN

## 2015-06-26 MED ORDER — METHADONE HCL 10 MG PO TABS
20.0000 mg | ORAL_TABLET | Freq: Three times a day (TID) | ORAL | Status: DC
Start: 2015-06-26 — End: 2015-06-26

## 2015-06-26 MED ORDER — PROMETHAZINE HCL 25 MG PO TABS: 25.0000 mg | ORAL_TABLET | Freq: Three times a day (TID) | ORAL | Status: DC | PRN

## 2015-06-26 NOTE — Addendum Note (Signed)
Addended by: Angela NevinWESSLING, Paulo Keimig D on: 06/26/2015 12:59 PM   Modules accepted: Orders

## 2015-06-26 NOTE — Patient Instructions (Signed)
CONTINUE WITH GOOD POSTURE AND REGULAR EXERCISES

## 2015-06-26 NOTE — Progress Notes (Signed)
Subjective:    Patient ID: Monica [name], female    DOB: [birth year], 64 y.o.   MRN: [id number]  HPI   Monica [name] is here in follow up of her chronic pain. She had RF's last month which have helped her neck pain substantially (after some initial pain). While it helped her neck it hasn't had a tremendous effect on her headaches. Her neck ROM is much improved. She continues to work on posture and ROM exercises.  Otherwise things have been near baseline. Her medications remain somewhat effective. She has ongoing pounding/throbbing headaches. She continues her medications as we prescribe.    Pain Inventory Average Pain 7 Pain Right Now 7 My pain is constant, burning, stabbing and aching  In the last 24 hours, has pain interfered with the following? General activity 8 Relation with others 9 Enjoyment of life 9 What TIME of day is your pain at its worst? morning Sleep (in general) Poor  Pain is worse with: bending and some activites Pain improves with: rest, heat/ice, pacing activities and medication Relief from Meds: 5  Mobility Do you have any goals in this area?  no  Function Do you have any goals in this area?  no  Neuro/Psych No problems in this area  Prior Studies Any changes since last visit?  no  Physicians involved in your care Dr Delano Metz   Family History  Problem Relation Age of Onset  . Colon cancer Mother 35   Social History   Social History  . Marital Status: Married    Spouse Name: N/A  . Number of Children: 3  . Years of Education: N/A   Occupational History  . disabled    Social History Main Topics  . Smoking status: Never Smoker   . Smokeless tobacco: Never Used  . Alcohol Use: No  . Drug Use: No  . Sexual Activity: Yes    Birth Control/ Protection: Post-menopausal   Other Topics Concern  . None   Social History Narrative   Past Surgical History  Procedure Laterality Date  . Colonoscopy  11/17/08    ext hemorrhoids  .  Gastro empy study  03/03/10    normal  . Esophagogastroduodenoscopy  04/05/10    [name] (65F),erosive reflux esophagitis, small hiatal hernia,  . Cholecystectomy    . Neck surgery  1998  . Hand surgery    . Breast lumpectomy      left  . Craniotomy      secondary TBI  . Nephrolithotomy  05/13/2012    Procedure: NEPHROLITHOTOMY PERCUTANEOUS;  Surgeon: Marcine Matar, MD;  Location: [facility];  Service: Urology;  Laterality: Left;     . Finger surgery      removal cyst left pinky   Past Medical History  Diagnosis Date  . Chronic nausea     normal GES   . Hemorrhoids 11/17/08    Colonoscopy Dr Karilyn Cota  . Chronic headaches     Dr Hermelinda Medicus [place] [facility]  . PTSD (post-traumatic stress disorder)   . DM (diabetes mellitus) (HCC)   . Torticollis   . Elevated liver enzymes     hx of, normal 03/2010  . Headache(784.0)   . Migraine without aura, with intractable migraine, so stated, without mention of status migrainosus   . Unspecified musculoskeletal disorders and symptoms referable to neck     cervical/trapezius  . Occipital neuralgia   . Posttraumatic stress disorder   . Torticollis   . Cervicalgia   . Complication  of anesthesia     had problem with local anesthesia-tried to assist Physician  . Renal lithiasis   . Glaucoma   . Seizure (HCC)     12 yrs ago-med related  . Arthritis    BP 151/67 mmHg  Pulse 66  SpO2 99%  Opioid Risk Score:   Fall Risk Score:  `1  Depression screen PHQ 2/9  [facility] 06/13/2015 02/24/2015 05/13/2012  Decreased Interest 0 2 0  Down, Depressed, Hopeless 0 1 0  PHQ - 2 Score 0 3 0  Altered sleeping - 1 -  Tired, decreased energy - 1 -  Change in appetite - 3 -  Feeling bad or failure about yourself  - 1 -  Trouble concentrating - 2 -  Moving slowly or fidgety/restless - 0 -  Suicidal thoughts - 0 -  PHQ-9 Score - 11 -      Review of Systems  Gastrointestinal: Positive for nausea.  Neurological:  Positive for headaches.  All other systems reviewed and are negative.      Objective:   Physical Exam  normal motor tone is noted in both LE's . DTR's appear to be 1+ to 2+ throughout. Muscle testing reveals 5/5 muscle strength of the upper extremity, and 5/5 of the lower extremity. Full range of motion in upper and lower extremities. Cervical ROM is improved in all planes. Only mild tenderness with Palpation in the cervical region today Neuro: Fine motor movements are normal in both hands.   Sensory is intact and symmetric to light touch, pinprick and proprioception.  DTR in the upper and lower extremity are present and symmetric 2+. No clonus is noted.  Patient arises from chair without difficulty. Tandem walk is stable.  Psych: in good spirits today    Assessment & Plan:   Assessment:  1. 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  2. Posttraumatic stress syndrome.  3. Borderline Personality--hx of  4. Myofascial pain right trap, and supraspinatus to a lesser degree. Facet injury/irritation appears to be a trigger for this.    PLAN:  1. Continue methadone rx. Continue demerol 50 mg qd prn for severe pain. Second rx's were given for next month  2. Naproxen-- prn.  3. Continue topamax at full dose. She is willing to accept the potential risk for stones.  4. Continue cervical range of motion/posture exercises 5. Continue k-tape, aromatherapy, and cervical traction also  6. 15 minutes of face to face patient care time were spent during this visit. All questions were encouraged and answered. I'll see her back in about 2 months.

## 2015-06-27 ENCOUNTER — Telehealth: Payer: Self-pay

## 2015-06-27 DIAGNOSIS — G43711 Chronic migraine without aura, intractable, with status migrainosus: Secondary | ICD-10-CM

## 2015-06-27 DIAGNOSIS — M47812 Spondylosis without myelopathy or radiculopathy, cervical region: Secondary | ICD-10-CM

## 2015-06-27 DIAGNOSIS — M7918 Myalgia, other site: Secondary | ICD-10-CM

## 2015-06-27 LAB — PMP ALCOHOL METABOLITE (ETG): ETGU: NEGATIVE ng/mL

## 2015-06-27 NOTE — Telephone Encounter (Addendum)
[place] [place] called about quantity written for patients Demerol. Patient normally gets #45. She was given a quantity of #10 at her visit on Monday. I spoke with pharmacy and it looks like her previous bridge script was refilled possibly by mistake. She will need a new Rx for the quantity of #45 and I believe she was given an additional month of her prescriptions at her visit as well. Could you please write Rx and I will have patient come by office and pick up scripts. Thanks

## 2015-06-28 MED ORDER — MEPERIDINE HCL 50 MG PO TABS: 50.0000 mg | ORAL_TABLET | Freq: Every day | ORAL | Status: DC | PRN

## 2015-06-28 NOTE — Telephone Encounter (Signed)
i have made the rx changes

## 2015-06-29 LAB — METHADONE (GC/LC/MS), URINE
EDDPUC: 17558 ng/mL (ref <100)
Methadone (GC/LC/MS), ur confirm: 9543 ng/mL (ref <100)

## 2015-06-29 LAB — MEPERIDINE (GC/LC/MS), URINE
Meperidine (GC/LC/MS), ur confirm: >2500 ng/mL (ref <100)
Normeperidine (GC/LC/MS), ur confirm: >2500 ng/mL — AB (ref <100)

## 2015-06-29 NOTE — Telephone Encounter (Signed)
LMTCB

## 2015-06-30 LAB — PRESCRIPTION MONITORING PROFILE (SOLSTAS)
Amphetamine/Meth: NEGATIVE ng/mL
BENZODIAZEPINE SCREEN, URINE: NEGATIVE ng/mL
BUPRENORPHINE, URINE: NEGATIVE ng/mL
Barbiturate Screen, Urine: NEGATIVE ng/mL
CANNABINOID SCRN UR: NEGATIVE ng/mL
CREATININE, URINE: 147.19 mg/dL (ref >20.0)
Carisoprodol, Urine: NEGATIVE ng/mL
Cocaine Metabolites: NEGATIVE ng/mL
ECSTASY: NEGATIVE ng/mL
FENTANYL URINE: NEGATIVE ng/mL
NITRITES URINE, INITIAL: NEGATIVE ug/mL
OPIATE SCREEN, URINE: NEGATIVE ng/mL
Oxycodone Screen, Ur: NEGATIVE ng/mL
PH URINE, INITIAL: 5 pH (ref 4.5–8.9)
Propoxyphene: NEGATIVE ng/mL
TRAMADOL UR: NEGATIVE ng/mL
Tapentadol, urine: NEGATIVE ng/mL
ZOLPIDEM, URINE: NEGATIVE ng/mL

## 2015-06-30 NOTE — Telephone Encounter (Signed)
Spoke with pt. Pt. Will come by and pick up RX on Monday. Thanks

## 2015-07-08 ENCOUNTER — Other Ambulatory Visit: Payer: Self-pay | Admitting: Physical Medicine & Rehabilitation

## 2015-07-12 NOTE — Progress Notes (Signed)
Urine drug screen for this encounter is consistent for prescribed medication 

## 2015-07-13 ENCOUNTER — Other Ambulatory Visit: Payer: Self-pay | Admitting: Physical Medicine & Rehabilitation

## 2015-07-26 ENCOUNTER — Encounter: Payer: Self-pay | Admitting: Pain Medicine

## 2015-07-26 ENCOUNTER — Ambulatory Visit: Payer: Worker's Compensation | Attending: Pain Medicine | Admitting: Pain Medicine

## 2015-07-26 VITALS — BP 140/58 | HR 80 | Temp 98.1°F | Resp 18 | Ht 64.0 in | Wt 147.0 lb

## 2015-07-26 DIAGNOSIS — F1190 Opioid use, unspecified, uncomplicated: Secondary | ICD-10-CM | POA: Insufficient documentation

## 2015-07-26 DIAGNOSIS — G96198 Other disorders of meninges, not elsewhere classified: Secondary | ICD-10-CM | POA: Insufficient documentation

## 2015-07-26 DIAGNOSIS — G9619 Other disorders of meninges, not elsewhere classified: Secondary | ICD-10-CM | POA: Insufficient documentation

## 2015-07-26 DIAGNOSIS — M961 Postlaminectomy syndrome, not elsewhere classified: Secondary | ICD-10-CM

## 2015-07-26 DIAGNOSIS — K219 Gastro-esophageal reflux disease without esophagitis: Secondary | ICD-10-CM | POA: Insufficient documentation

## 2015-07-26 DIAGNOSIS — M7918 Myalgia, other site: Secondary | ICD-10-CM

## 2015-07-26 DIAGNOSIS — M1288 Other specific arthropathies, not elsewhere classified, other specified site: Secondary | ICD-10-CM

## 2015-07-26 DIAGNOSIS — R51 Headache: Secondary | ICD-10-CM | POA: Diagnosis not present

## 2015-07-26 DIAGNOSIS — G4486 Cervicogenic headache: Secondary | ICD-10-CM

## 2015-07-26 DIAGNOSIS — M5382 Other specified dorsopathies, cervical region: Secondary | ICD-10-CM

## 2015-07-26 DIAGNOSIS — F603 Borderline personality disorder: Secondary | ICD-10-CM | POA: Diagnosis not present

## 2015-07-26 DIAGNOSIS — M47812 Spondylosis without myelopathy or radiculopathy, cervical region: Secondary | ICD-10-CM | POA: Diagnosis not present

## 2015-07-26 DIAGNOSIS — Z79891 Long term (current) use of opiate analgesic: Secondary | ICD-10-CM

## 2015-07-26 DIAGNOSIS — Z79899 Other long term (current) drug therapy: Secondary | ICD-10-CM

## 2015-07-26 DIAGNOSIS — F1120 Opioid dependence, uncomplicated: Secondary | ICD-10-CM

## 2015-07-26 DIAGNOSIS — M542 Cervicalgia: Secondary | ICD-10-CM | POA: Diagnosis not present

## 2015-07-26 DIAGNOSIS — Z9889 Other specified postprocedural states: Secondary | ICD-10-CM | POA: Insufficient documentation

## 2015-07-26 DIAGNOSIS — G8929 Other chronic pain: Secondary | ICD-10-CM | POA: Insufficient documentation

## 2015-07-26 DIAGNOSIS — M4802 Spinal stenosis, cervical region: Secondary | ICD-10-CM | POA: Diagnosis not present

## 2015-07-26 DIAGNOSIS — G43909 Migraine, unspecified, not intractable, without status migrainosus: Secondary | ICD-10-CM | POA: Diagnosis not present

## 2015-07-26 DIAGNOSIS — M797 Fibromyalgia: Secondary | ICD-10-CM

## 2015-07-26 NOTE — Progress Notes (Signed)
Safety precautions to be maintained throughout the outpatient stay will include: orient to surroundings, keep bed in low position, maintain call bell within reach at all times, provide assistance with transfer out of bed and ambulation.  

## 2015-07-26 NOTE — Progress Notes (Signed)
Patient's Name: Monica [name] MRN: [id number] DOB: August 27, 1950 DOS: 07/26/2015  Primary Reason(s) for Visit: Post-Procedure evaluation CC: Headache   HPI:   Monica [name] is a 64 y.o. year old, female patient, who returns today as an established patient. 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 Her primarily concern today is the Headache     The patient returns today after having had a left-sided C3, C4, C5, C6 medial branch radiofrequency ablation under fluoroscopic guidance and IV sedation. The patient indicates that this has helped significantly with the neck pain where she has regained significant range of motion. However, she continues to have her daily headaches. The patient describes the headaches as being in the distribution of the lesser occipital nerve. Because her symptoms are bilaterally, it is likely that we will need to go back and include the C2 nerve root, as well as the TON (third occipital nerve), in order to get the root of the occipital nerve. I have talked to the patient about the possibility of getting problems with balance when doing this. Because of this I prefer to do one-sided time. Today, in reviewing her condition, I have recommended to do a cervical epidural steroid injection to see if by chance  this helps. I make sure to ask the patient about having her primary pain physician do the injection since I did not want to be stepping on any toes. She indicated that he has tried everything and at this point he would like some new input.  I will be bringing the patient back for this cervical epidural steroid injection on the left side. If her headaches do not go away, then the next step would be to go back in and specifically block today C2 nerve root and TON on the left side.  Today's Pain Score: 3  Reported level of pain is incompatible with clinical obrservations. This may be secondary to a possible lack of understanding on how the pain scale works. Pain Type: Chronic pain Pain Location: Head Pain Descriptors / Indicators: Aching Pain Frequency: Constant  Date of Last Visit: 06/13/15 Service Provided on Last Visit: Procedure  Pharmacotherapy Review: The patient's medication management is currently not under our care Side-effects or Adverse reactions: None reported Effectiveness: Described as relatively effective, allowing for increase in activities of daily living (ADL) Onset of action: Within expected pharmacological parameters Duration of action: Within normal limits for medication Peak effect: Timing and results are as within normal expected parameters [place] PMP: Compliant with practice rules and regulations UDS Results: No UDS available, at this time. Last UDS done on 05/09/2014. No results available. UDS Interpretation: No UDS available, at this time Medication Assessment Form: Not applicable Treatment compliance: Not applicable Substance Use Disorder (SUD) Risk Level: Not applicable Pharmacologic Plan: Not being provided by this office.  Lab Work: Inflammation Markers No results found for: ESRSEDRATE, CRP  Renal Function Lab Results  Component Value Date   BUN 16 07/31/2012   CREATININE 1.10 07/31/2012   GFRAA  62* 07/31/2012   GFRNONAA 54* 07/31/2012    Hepatic  Function Lab Results  Component Value Date   AST 18 05/04/2012   ALT 22 05/04/2012   ALBUMIN 3.9 05/04/2012    Electrolytes Lab Results  Component Value Date   NA 137 07/31/2012   K 4.5 08/03/2012   CL 103 07/31/2012   CALCIUM 9.7 07/31/2012    Illicit Drugs Lab Results  Component Value Date   THCU NEG 06/26/2015   COCAINSCRNUR NEG 06/26/2015   MDMA NEG 06/26/2015   AMPHETMU NEG 06/26/2015   Procedure Assessment:  Procedure done on last visit: Left 3, C4, C5, and C6 cervical medial branch radiofrequency ablation under fluoroscopic guidance and IV sedation. Side-effects or Adverse reactions: None reported Sedation: Procedure was performed with sedation  Results: Ultra-Short Term Relief (First 1 hour after procedure): 0 %  Ultra-short term relief is a normal physiological response to analgesics and anesthetics provided during the procedure Short Term Relief (Initial 4-6 hrs after procedure): 0 % Short-term relief confirms injected site as etiology of pain Long Term Relief : 0 % (100% relief of neck pain, 0 relief of headache) Long term relief is possibly due to sympathetic blockade, or the anti-inflammatory effects of steroids, if administered during procedure Current Relief (Now):  100% relief in the neck pain, with significant increased range of motion. Persistent relief would suggest effective anti-inflammatory effects from steroids Interpretation of Results: Complete relief of the neck pain but with persistent cervicogenic headaches. This could be secondary to the C2 nerve root, which we did not lesion.  Allergies: Monica [name] is allergic to hydromorphone hcl; aspirin; capsaicin; depakote; milnacipran hcl; pristiq; and zanaflex.  Meds: The patient has a current medication list which includes the following prescription(s): glimepiride, lidocaine, meperidine, metaxalone, metformin, methadone, naproxen, promethazine, sumatriptan, and topiramate. Requested Prescriptions     No prescriptions requested or ordered in this encounter    ROS: Constitutional: Afebrile, no chills, well hydrated and well nourished Gastrointestinal: negative Musculoskeletal:negative Neurological: negative Behavioral/Psych: negative  PFSH: Medical:  Monica [name]  has a past medical history of Chronic nausea; Hemorrhoids (11/17/08); Chronic headaches; PTSD (post-traumatic stress disorder); DM (diabetes mellitus) (HCC); Torticollis; Elevated liver enzymes; Headache(784.0); Migraine without aura, with intractable migraine, so stated, without mention of status migrainosus; Unspecified musculoskeletal disorders and symptoms referable to neck; Occipital neuralgia; Posttraumatic stress disorder; Torticollis; Cervicalgia; Complication of anesthesia; Renal lithiasis; Glaucoma; Seizure (HCC); Arthritis; and Cervical facet joint syndrome (04/22/2012). Family: family history includes Colon cancer (age of onset: 76) in her mother. Surgical:  has past surgical history that includes Colonoscopy (11/17/08); gastro empy study (03/03/10); Esophagogastroduodenoscopy (04/05/10); Cholecystectomy; Neck surgery (1998); Hand surgery; Breast lumpectomy; Craniotomy; Nephrolithotomy (05/13/2012); and Finger surgery. Tobacco:  reports that she has never smoked. She has never used smokeless tobacco. Alcohol:  reports that she does not drink alcohol. Drug:  reports that she does not use illicit drugs.  Physical Exam: Vitals:  Today's Vitals   07/26/15 0858 07/26/15 1348  BP: 140/58   Pulse: 80   Temp: 98.1 F (36.7 C)   TempSrc: Oral   Resp: 18   Height:  (1.626 m)   Weight: 147 lb (66.679 kg)   SpO2: 99%   PainSc: 3  3   Calculated BMI: Body mass index is 25.22 kg/(m^2). General appearance: alert, cooperative, appears stated age and no distress Eyes: PERLA Respiratory: No evidence respiratory distress, no audible rales or ronchi and no use of accessory muscles of respiration Neck: no adenopathy, no carotid  bruit, no JVD, supple, symmetrical, trachea midline and thyroid not enlarged, symmetric, no tenderness/mass/nodules  Cervical Spine ROM: Severe decreased range of motion. However, it is improved compared to the last visit. Palpation: No palpable trigger points  Upper Extremities ROM: Adequate bilaterally Strength: 5/5 for all flexors and extensors of the upper extremity, bilaterally Pulses: Palpable bilaterally Neurologic: No allodynia, No hyperesthesia, No hyperpathia and No sensory abnormalities  Lumbar Spine ROM: Adequate for flexion, extension, rotation, and lateral bending Palpation: No palpable trigger points Lumbar Hyperextension and rotation: Non-contributory Patrick's Maneuver: Non-contributory  Lower Extremities ROM: Adequate bilaterally Strength: 5/5 for all flexors and extensors of the lower extremity, bilaterally Pulses: Palpable bilaterally Neurologic: No allodynia, No hyperesthesia, No hyperpathia, No sensory abnormalities and No antalgic gait or posture  Assessment: Encounter Diagnosis:  Primary Diagnosis: Cervicogenic headache [R51]  Plan: Interventional Therapies: Left cervical epidural steroid injection under fluoroscopic guidance and IV sedation.  Monica [name] was seen today for headache.  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  After clinic hours [phone] to have operator reach doctor.  Bring all of your medication bottles to all your appointments in the pain clinic.  To cancel or reschedule your appointment with Pain Management please remember to call 24 hours in advance to avoid a fee.  Refer to the educational materials which you have been given on: General Risks, I had my Procedure. Discharge Instructions, Post Sedation.  Post Procedure Instructions:  The drugs you were given will stay in your system until tomorrow, so for the next 24 hours you should not drive, make any legal decisions or drink any alcoholic beverages.  You may eat anything you prefer, but it is better to start with liquids then soups and crackers, and gradually work up to solid foods.  Please notify your doctor immediately if you have any unusual bleeding, trouble breathing or pain that is not related to your normal pain.  Depending on the type of procedure that was done, some parts of your body may feel week and/or numb.  This usually clears up by tonight or the next day.  Walk with the use of an assistive device or accompanied by an adult for the 24 hours.  You may use ice on the affected area for the first 24 hours.  Put ice in a Ziploc bag and cover with a towel and place against area 15 minutes on 15 minutes off.  You may switch to heat after 24 hours.GENERAL RISKS AND COMPLICATIONS  What are the risk, side effects and possible complications? Generally speaking, most procedures are safe.  However, with  any procedure there are risks, side effects, and the possibility of complications.  The risks and complications are  dependent upon the sites that are lesioned, or the type of nerve block to be performed.  The closer the procedure is to the spine, the more serious the risks are.  Great care is taken when placing the radio frequency needles, block needles or lesioning probes, but sometimes complications can occur. 1. Infection: Any time there is an injection through the skin, there is a risk of infection.  This is why sterile conditions are used for these blocks.  There are four possible types of infection. 1. Localized skin infection. 2. Central Nervous System Infection-This can be in the form of Meningitis, which can be deadly. 3. Epidural Infections-This can be in the form of an epidural abscess, which can cause pressure inside of the spine, causing compression of the spinal cord with subsequent paralysis. This would require an emergency surgery to decompress, and there are no guarantees that the patient would recover from the paralysis. 4. Discitis-This is an infection of the intervertebral discs.  It occurs in about 1% of discography procedures.  It is difficult to treat and it may lead to surgery.        2. Pain: the needles have to go through skin and soft tissues, will cause soreness.       3. Damage to internal structures:  The nerves to be lesioned may be near blood vessels or    other nerves which can be potentially damaged.       4. Bleeding: Bleeding is more common if the patient is taking blood thinners such as  aspirin, Coumadin, Ticiid, Plavix, etc., or if he/she have some genetic predisposition  such as hemophilia. Bleeding into the spinal canal can cause compression of the spinal  cord with subsequent paralysis.  This would require an emergency surgery to  decompress and there are no guarantees that the patient would recover from the  paralysis.       5. Pneumothorax:  Puncturing of a lung is a possibility, every time a needle is introduced in  the area of the chest or upper back.  Pneumothorax refers  to free air around the  collapsed lung(s), inside of the thoracic cavity (chest cavity).  Another two possible  complications related to a similar event would include: Hemothorax and Chylothorax.   These are variations of the Pneumothorax, where instead of air around the collapsed  lung(s), you may have blood or chyle, respectively.       6. Spinal headaches: They may occur with any procedures in the area of the spine.       7. Persistent CSF (Cerebro-Spinal Fluid) leakage: This is a rare problem, but may occur  with prolonged intrathecal or epidural catheters either due to the formation of a fistulous  track or a dural tear.       8. Nerve damage: By working so close to the spinal cord, there is always a possibility of  nerve damage, which could be as serious as a permanent spinal cord injury with  paralysis.       9. Death:  Although rare, severe deadly allergic reactions known as "Anaphylactic  reaction" can occur to any of the medications used.      10. Worsening of the symptoms:  We can always make thing worse.  What are the chances of something like this happening? Chances of any of this occuring are extremely low.  By statistics,  you have more of a chance of getting killed in a motor vehicle accident: while driving to the hospital than any of the above occurring .  Nevertheless, you should be aware that they are possibilities.  In general, it is similar to taking a shower.  Everybody knows that you can slip, hit your head and get killed.  Does that mean that you should not shower again?  Nevertheless always keep in mind that statistics do not mean anything if you happen to be on the wrong side of them.  Even if a procedure has a 1 (one) in a 1,000,000 (million) chance of going wrong, it you happen to be that one..Also, keep in mind that by statistics, you have more of a chance of having something go wrong when taking medications.  Who should not have this procedure? If you are on a blood thinning  medication (e.g. Coumadin, Plavix, see list of "Blood Thinners"), or if you have an active infection going on, you should not have the procedure.  If you are taking any blood thinners, please inform your physician.  How should I prepare for this procedure?  Do not eat or drink anything at least six hours prior to the procedure.  Bring a driver with you .  It cannot be a taxi.  Come accompanied by an adult that can drive you back, and that is strong enough to help you if your legs get weak or numb from the local anesthetic.  Take all of your medicines the morning of the procedure with just enough water to swallow them.  If you have diabetes, make sure that you are scheduled to have your procedure done first thing in the morning, whenever possible.  If you have diabetes, take only half of your insulin dose and notify our nurse that you have done so as soon as you arrive at the clinic.  If you are diabetic, but only take blood sugar pills (oral hypoglycemic), then do not take them on the morning of your procedure.  You may take them after you have had the procedure.  Do not take aspirin or any aspirin-containing medications, at least eleven (11) days prior to the procedure.  They may prolong bleeding.  Wear loose fitting clothing that may be easy to take off and that you would not mind if it got stained with Betadine or blood.  Do not wear any jewelry or perfume  Remove any nail coloring.  It will interfere with some of our monitoring equipment.  NOTE: Remember that this is not meant to be interpreted as a complete list of all possible complications.  Unforeseen problems may occur.  BLOOD THINNERS The following drugs contain aspirin or other products, which can cause increased bleeding during surgery and should not be taken for 2 weeks prior to and 1 week after surgery.  If you should need take something for relief of minor pain, you may take acetaminophen which is found in Tylenol,m  Datril, Anacin-3 and Panadol. It is not blood thinner. The products listed below are.  Do not take any of the products listed below in addition to any listed on your instruction sheet.  A.P.C or A.P.C with Codeine Codeine Phosphate Capsules #3 Ibuprofen Ridaura  ABC compound Congesprin Imuran rimadil  Advil Cope Indocin Robaxisal  Alka-Seltzer Effervescent Pain Reliever and Antacid Coricidin or Coricidin-D  Indomethacin Rufen  Alka-Seltzer plus Cold Medicine Cosprin Ketoprofen S-A-C Tablets  Anacin Analgesic Tablets or Capsules Coumadin Korlgesic Salflex  Anacin Extra Strength  Analgesic tablets or capsules CP-2 Tablets Lanoril Salicylate  Anaprox Cuprimine Capsules Levenox Salocol  Anexsia-D Dalteparin Magan Salsalate  Anodynos Darvon compound Magnesium Salicylate Sine-off  Ansaid Dasin Capsules Magsal Sodium Salicylate  Anturane Depen Capsules Marnal Soma  APF Arthritis pain formula Dewitt's Pills Measurin Stanback  Argesic Dia-Gesic Meclofenamic Sulfinpyrazone  Arthritis Bayer Timed Release Aspirin Diclofenac Meclomen Sulindac  Arthritis pain formula Anacin Dicumarol Medipren Supac  Analgesic (Safety coated) Arthralgen Diffunasal Mefanamic Suprofen  Arthritis Strength Bufferin Dihydrocodeine Mepro Compound Suprol  Arthropan liquid Dopirydamole Methcarbomol with Aspirin Synalgos  ASA tablets/Enseals Disalcid Micrainin Tagament  Ascriptin Doan's Midol Talwin  Ascriptin A/D Dolene Mobidin Tanderil  Ascriptin Extra Strength Dolobid Moblgesic Ticlid  Ascriptin with Codeine Doloprin or Doloprin with Codeine Momentum Tolectin  Asperbuf Duoprin Mono-gesic Trendar  Aspergum Duradyne Motrin or Motrin IB Triminicin  Aspirin plain, buffered or enteric coated Durasal Myochrisine Trigesic  Aspirin Suppositories Easprin Nalfon Trillsate  Aspirin with Codeine Ecotrin Regular or Extra Strength Naprosyn Uracel  Atromid-S Efficin Naproxen Ursinus  Auranofin Capsules Elmiron Neocylate Vanquish   Axotal Emagrin Norgesic Verin  Azathioprine Empirin or Empirin with Codeine Normiflo Vitamin E  Azolid Emprazil Nuprin Voltaren  Bayer Aspirin plain, buffered or children's or timed BC Tablets or powders Encaprin Orgaran Warfarin Sodium  Buff-a-Comp Enoxaparin Orudis Zorpin  Buff-a-Comp with Codeine Equegesic Os-Cal-Gesic   Buffaprin Excedrin plain, buffered or Extra Strength Oxalid   Bufferin Arthritis Strength Feldene Oxphenbutazone   Bufferin plain or Extra Strength Feldene Capsules Oxycodone with Aspirin   Bufferin with Codeine Fenoprofen Fenoprofen Pabalate or Pabalate-SF   Buffets II Flogesic Panagesic   Buffinol plain or Extra Strength Florinal or Florinal with Codeine Panwarfarin   Buf-Tabs Flurbiprofen Penicillamine   Butalbital Compound Four-way cold tablets Penicillin   Butazolidin Fragmin Pepto-Bismol   Carbenicillin Geminisyn Percodan   Carna Arthritis Reliever Geopen Persantine   Carprofen Gold's salt Persistin   Chloramphenicol Goody's Phenylbutazone   Chloromycetin Haltrain Piroxlcam   Clmetidine heparin Plaquenil   Cllnoril Hyco-pap Ponstel   Clofibrate Hydroxy chloroquine Propoxyphen         Before stopping any of these medications, be sure to consult the physician who ordered them.  Some, such as Coumadin (Warfarin) are ordered to prevent or treat serious conditions such as "deep thrombosis", "pumonary embolisms", and other heart problems.  The amount of time that you may need off of the medication may also vary with the medication and the reason for which you were taking it.  If you are taking any of these medications, please make sure you notify your pain physician before you undergo any procedures.         Epidural Steroid Injection Patient Information  Description: The epidural space surrounds the nerves as they exit the spinal cord.  In some patients, the nerves can be compressed and inflamed by a bulging disc or a tight spinal canal (spinal stenosis).   By injecting steroids into the epidural space, we can bring irritated nerves into direct contact with a potentially helpful medication.  These steroids act directly on the irritated nerves and can reduce swelling and inflammation which often leads to decreased pain.  Epidural steroids may be injected anywhere along the spine and from the neck to the low back depending upon the location of your pain.   After numbing the skin with local anesthetic (like Novocaine), a small needle is passed into the epidural space slowly.  You may experience a sensation of pressure while this is being done.  The entire  block usually last less than 10 minutes.  Conditions which may be treated by epidural steroids:   Low back and leg pain  Neck and arm pain  Spinal stenosis  Post-laminectomy syndrome  Herpes zoster (shingles) pain  Pain from compression fractures  Preparation for the injection:  1. Do not eat any solid food or dairy products within 6 hours of your appointment.  2. You may drink clear liquids up to 2 hours before appointment.  Clear liquids include water, black coffee, juice or soda.  No milk or cream please. 3. You may take your regular medication, including pain medications, with a sip of water before your appointment  Diabetics should hold regular insulin (if taken separately) and take 1/2 normal NPH dos the morning of the procedure.  Carry some sugar containing items with you to your appointment. 4. A driver must accompany you and be prepared to drive you home after your procedure.  5. Bring all your current medications with your. 6. An IV may be inserted and sedation may be given at the discretion of the physician.   7. A blood pressure cuff, EKG and other monitors will often be applied during the procedure.  Some patients may need to have extra oxygen administered for a short period. 8. You will be asked to provide medical information, including your allergies, prior to the procedure.  We  must know immediately if you are taking blood thinners (like Coumadin/Warfarin)  Or if you are allergic to IV iodine contrast (dye). We must know if you could possible be pregnant.  Possible side-effects:  Bleeding from needle site  Infection (rare, may require surgery)  Nerve injury (rare)  Numbness & tingling (temporary)  Difficulty urinating (rare, temporary)  Spinal headache ( a headache worse with upright posture)  Light -headedness (temporary)  Pain at injection site (several days)  Decreased blood pressure (temporary)  Weakness in arm/leg (temporary)  Pressure sensation in back/neck (temporary)  Call if you experience:  Fever/chills associated with headache or increased back/neck pain.  Headache worsened by an upright position.  New onset weakness or numbness of an extremity below the injection site  Hives or difficulty breathing (go to the emergency room)  Inflammation or drainage at the infection site  Severe back/neck pain  Any new symptoms which are concerning to you  Please note:  Although the local anesthetic injected can often make your back or neck feel good for several hours after the injection, the pain will likely return.  It takes 3-7 days for steroids to work in the epidural space.  You may not notice any pain relief for at least that one week.  If effective, we will often do a series of three injections spaced 3-6 weeks apart to maximally decrease your pain.  After the initial series, we generally will wait several months before considering a repeat injection of the same type.  If you have any questions, please call [phone] [facility]   Medications discontinued today:  There are no discontinued medications. Medications administered today:  Monica [name] does not currently have medications on file.  Primary Care Physician: Kirstie PeriSHAH,ASHISH, MD [facility] Note by:  Sydnee LevansFrancisco A. Laban EmperorNaveira, M.D, DABA, DABAPM, DABPM, DABIPP, FIPP

## 2015-07-26 NOTE — Patient Instructions (Signed)
Pain Management Discharge Instructions  General Discharge Instructions :  If you need to reach your doctor call: Monday-Friday 8:00 am - 4:00 pm at [phone] or toll free [phone].  After clinic hours [phone] to have operator reach doctor.  Bring all of your medication bottles to all your appointments in the pain clinic.  To cancel or reschedule your appointment with Pain Management please remember to call 24 hours in advance to avoid a fee.  Refer to the educational materials which you have been given on: General Risks, I had my Procedure. Discharge Instructions, Post Sedation.  Post Procedure Instructions:  The drugs you were given will stay in your system until tomorrow, so for the next 24 hours you should not drive, make any legal decisions or drink any alcoholic beverages.  You may eat anything you prefer, but it is better to start with liquids then soups and crackers, and gradually work up to solid foods.  Please notify your doctor immediately if you have any unusual bleeding, trouble breathing or pain that is not related to your normal pain.  Depending on the type of procedure that was done, some parts of your body may feel week and/or numb.  This usually clears up by tonight or the next day.  Walk with the use of an assistive device or accompanied by an adult for the 24 hours.  You may use ice on the affected area for the first 24 hours.  Put ice in a Ziploc bag and cover with a towel and place against area 15 minutes on 15 minutes off.  You may switch to heat after 24 hours.GENERAL RISKS AND COMPLICATIONS  What are the risk, side effects and possible complications? Generally speaking, most procedures are safe.  However, with any procedure there are risks, side effects, and the possibility of complications.  The risks and complications are dependent upon the sites that are lesioned, or the type of nerve block to be performed.  The closer the procedure is to the spine,  the more serious the risks are.  Great care is taken when placing the radio frequency needles, block needles or lesioning probes, but sometimes complications can occur. 1. Infection: Any time there is an injection through the skin, there is a risk of infection.  This is why sterile conditions are used for these blocks.  There are four possible types of infection. 1. Localized skin infection. 2. Central Nervous System Infection-This can be in the form of Meningitis, which can be deadly. 3. Epidural Infections-This can be in the form of an epidural abscess, which can cause pressure inside of the spine, causing compression of the spinal cord with subsequent paralysis. This would require an emergency surgery to decompress, and there are no guarantees that the patient would recover from the paralysis. 4. Discitis-This is an infection of the intervertebral discs.  It occurs in about 1% of discography procedures.  It is difficult to treat and it may lead to surgery.        2. Pain: the needles have to go through skin and soft tissues, will cause soreness.       3. Damage to internal structures:  The nerves to be lesioned may be near blood vessels or    other nerves which can be potentially damaged.       4. Bleeding: Bleeding is more common if the patient is taking blood thinners such as  aspirin, Coumadin, Ticiid, Plavix, etc., or if he/she have some genetic predisposition  such as   hemophilia. Bleeding into the spinal canal can cause compression of the spinal  cord with subsequent paralysis.  This would require an emergency surgery to  decompress and there are no guarantees that the patient would recover from the  paralysis.       5. Pneumothorax:  Puncturing of a lung is a possibility, every time a needle is introduced in  the area of the chest or upper back.  Pneumothorax refers to free air around the  collapsed lung(s), inside of the thoracic cavity (chest cavity).  Another two possible  complications  related to a similar event would include: Hemothorax and Chylothorax.   These are variations of the Pneumothorax, where instead of air around the collapsed  lung(s), you may have blood or chyle, respectively.       6. Spinal headaches: They may occur with any procedures in the area of the spine.       7. Persistent CSF (Cerebro-Spinal Fluid) leakage: This is a rare problem, but may occur  with prolonged intrathecal or epidural catheters either due to the formation of a fistulous  track or a dural tear.       8. Nerve damage: By working so close to the spinal cord, there is always a possibility of  nerve damage, which could be as serious as a permanent spinal cord injury with  paralysis.       9. Death:  Although rare, severe deadly allergic reactions known as "Anaphylactic  reaction" can occur to any of the medications used.      10. Worsening of the symptoms:  We can always make thing worse.  What are the chances of something like this happening? Chances of any of this occuring are extremely low.  By statistics, you have more of a chance of getting killed in a motor vehicle accident: while driving to the hospital than any of the above occurring .  Nevertheless, you should be aware that they are possibilities.  In general, it is similar to taking a shower.  Everybody knows that you can slip, hit your head and get killed.  Does that mean that you should not shower again?  Nevertheless always keep in mind that statistics do not mean anything if you happen to be on the wrong side of them.  Even if a procedure has a 1 (one) in a 1,000,000 (million) chance of going wrong, it you happen to be that one..Also, keep in mind that by statistics, you have more of a chance of having something go wrong when taking medications.  Who should not have this procedure? If you are on a blood thinning medication (e.g. Coumadin, Plavix, see list of "Blood Thinners"), or if you have an active infection going on, you should not  have the procedure.  If you are taking any blood thinners, please inform your physician.  How should I prepare for this procedure?  Do not eat or drink anything at least six hours prior to the procedure.  Bring a driver with you .  It cannot be a taxi.  Come accompanied by an adult that can drive you back, and that is strong enough to help you if your legs get weak or numb from the local anesthetic.  Take all of your medicines the morning of the procedure with just enough water to swallow them.  If you have diabetes, make sure that you are scheduled to have your procedure done first thing in the morning, whenever possible.  If you have diabetes,   take only half of your insulin dose and notify our nurse that you have done so as soon as you arrive at the clinic.  If you are diabetic, but only take blood sugar pills (oral hypoglycemic), then do not take them on the morning of your procedure.  You may take them after you have had the procedure.  Do not take aspirin or any aspirin-containing medications, at least eleven (11) days prior to the procedure.  They may prolong bleeding.  Wear loose fitting clothing that may be easy to take off and that you would not mind if it got stained with Betadine or blood.  Do not wear any jewelry or perfume  Remove any nail coloring.  It will interfere with some of our monitoring equipment.  NOTE: Remember that this is not meant to be interpreted as a complete list of all possible complications.  Unforeseen problems may occur.  BLOOD THINNERS The following drugs contain aspirin or other products, which can cause increased bleeding during surgery and should not be taken for 2 weeks prior to and 1 week after surgery.  If you should need take something for relief of minor pain, you may take acetaminophen which is found in Tylenol,m Datril, Anacin-3 and Panadol. It is not blood thinner. The products listed below are.  Do not take any of the products listed below  in addition to any listed on your instruction sheet.  A.P.C or A.P.C with Codeine Codeine Phosphate Capsules #3 Ibuprofen Ridaura  ABC compound Congesprin Imuran rimadil  Advil Cope Indocin Robaxisal  Alka-Seltzer Effervescent Pain Reliever and Antacid Coricidin or Coricidin-D  Indomethacin Rufen  Alka-Seltzer plus Cold Medicine Cosprin Ketoprofen S-A-C Tablets  Anacin Analgesic Tablets or Capsules Coumadin Korlgesic Salflex  Anacin Extra Strength Analgesic tablets or capsules CP-2 Tablets Lanoril Salicylate  Anaprox Cuprimine Capsules Levenox Salocol  Anexsia-D Dalteparin Magan Salsalate  Anodynos Darvon compound Magnesium Salicylate Sine-off  Ansaid Dasin Capsules Magsal Sodium Salicylate  Anturane Depen Capsules Marnal Soma  APF Arthritis pain formula Dewitt's Pills Measurin Stanback  Argesic Dia-Gesic Meclofenamic Sulfinpyrazone  Arthritis Bayer Timed Release Aspirin Diclofenac Meclomen Sulindac  Arthritis pain formula Anacin Dicumarol Medipren Supac  Analgesic (Safety coated) Arthralgen Diffunasal Mefanamic Suprofen  Arthritis Strength Bufferin Dihydrocodeine Mepro Compound Suprol  Arthropan liquid Dopirydamole Methcarbomol with Aspirin Synalgos  ASA tablets/Enseals Disalcid Micrainin Tagament  Ascriptin Doan's Midol Talwin  Ascriptin A/D Dolene Mobidin Tanderil  Ascriptin Extra Strength Dolobid Moblgesic Ticlid  Ascriptin with Codeine Doloprin or Doloprin with Codeine Momentum Tolectin  Asperbuf Duoprin Mono-gesic Trendar  Aspergum Duradyne Motrin or Motrin IB Triminicin  Aspirin plain, buffered or enteric coated Durasal Myochrisine Trigesic  Aspirin Suppositories Easprin Nalfon Trillsate  Aspirin with Codeine Ecotrin Regular or Extra Strength Naprosyn Uracel  Atromid-S Efficin Naproxen Ursinus  Auranofin Capsules Elmiron Neocylate Vanquish  Axotal Emagrin Norgesic Verin  Azathioprine Empirin or Empirin with Codeine Normiflo Vitamin E  Azolid Emprazil Nuprin Voltaren  Bayer  Aspirin plain, buffered or children's or timed BC Tablets or powders Encaprin Orgaran Warfarin Sodium  Buff-a-Comp Enoxaparin Orudis Zorpin  Buff-a-Comp with Codeine Equegesic Os-Cal-Gesic   Buffaprin Excedrin plain, buffered or Extra Strength Oxalid   Bufferin Arthritis Strength Feldene Oxphenbutazone   Bufferin plain or Extra Strength Feldene Capsules Oxycodone with Aspirin   Bufferin with Codeine Fenoprofen Fenoprofen Pabalate or Pabalate-SF   Buffets II Flogesic Panagesic   Buffinol plain or Extra Strength Florinal or Florinal with Codeine Panwarfarin   Buf-Tabs Flurbiprofen Penicillamine   Butalbital Compound Four-way cold tablets   Penicillin   Butazolidin Fragmin Pepto-Bismol   Carbenicillin Geminisyn Percodan   Carna Arthritis Reliever Geopen Persantine   Carprofen Gold's salt Persistin   Chloramphenicol Goody's Phenylbutazone   Chloromycetin Haltrain Piroxlcam   Clmetidine heparin Plaquenil   Cllnoril Hyco-pap Ponstel   Clofibrate Hydroxy chloroquine Propoxyphen         Before stopping any of these medications, be sure to consult the physician who ordered them.  Some, such as Coumadin (Warfarin) are ordered to prevent or treat serious conditions such as "deep thrombosis", "pumonary embolisms", and other heart problems.  The amount of time that you may need off of the medication may also vary with the medication and the reason for which you were taking it.  If you are taking any of these medications, please make sure you notify your pain physician before you undergo any procedures.         Epidural Steroid Injection Patient Information  Description: The epidural space surrounds the nerves as they exit the spinal cord.  In some patients, the nerves can be compressed and inflamed by a bulging disc or a tight spinal canal (spinal stenosis).  By injecting steroids into the epidural space, we can bring irritated nerves into direct contact with a potentially helpful medication.   These steroids act directly on the irritated nerves and can reduce swelling and inflammation which often leads to decreased pain.  Epidural steroids may be injected anywhere along the spine and from the neck to the low back depending upon the location of your pain.   After numbing the skin with local anesthetic (like Novocaine), a small needle is passed into the epidural space slowly.  You may experience a sensation of pressure while this is being done.  The entire block usually last less than 10 minutes.  Conditions which may be treated by epidural steroids:   Low back and leg pain  Neck and arm pain  Spinal stenosis  Post-laminectomy syndrome  Herpes zoster (shingles) pain  Pain from compression fractures  Preparation for the injection:  1. Do not eat any solid food or dairy products within 6 hours of your appointment.  2. You may drink clear liquids up to 2 hours before appointment.  Clear liquids include water, black coffee, juice or soda.  No milk or cream please. 3. You may take your regular medication, including pain medications, with a sip of water before your appointment  Diabetics should hold regular insulin (if taken separately) and take 1/2 normal NPH dos the morning of the procedure.  Carry some sugar containing items with you to your appointment. 4. A driver must accompany you and be prepared to drive you home after your procedure.  5. Bring all your current medications with your. 6. An IV may be inserted and sedation may be given at the discretion of the physician.   7. A blood pressure cuff, EKG and other monitors will often be applied during the procedure.  Some patients may need to have extra oxygen administered for a short period. 8. You will be asked to provide medical information, including your allergies, prior to the procedure.  We must know immediately if you are taking blood thinners (like Coumadin/Warfarin)  Or if you are allergic to IV iodine contrast (dye). We  must know if you could possible be pregnant.  Possible side-effects:  Bleeding from needle site  Infection (rare, may require surgery)  Nerve injury (rare)  Numbness & tingling (temporary)  Difficulty urinating (rare, temporary)  Spinal headache (   a headache worse with upright posture)  Light -headedness (temporary)  Pain at injection site (several days)  Decreased blood pressure (temporary)  Weakness in arm/leg (temporary)  Pressure sensation in back/neck (temporary)  Call if you experience:  Fever/chills associated with headache or increased back/neck pain.  Headache worsened by an upright position.  New onset weakness or numbness of an extremity below the injection site  Hives or difficulty breathing (go to the emergency room)  Inflammation or drainage at the infection site  Severe back/neck pain  Any new symptoms which are concerning to you  Please note:  Although the local anesthetic injected can often make your back or neck feel good for several hours after the injection, the pain will likely return.  It takes 3-7 days for steroids to work in the epidural space.  You may not notice any pain relief for at least that one week.  If effective, we will often do a series of three injections spaced 3-6 weeks apart to maximally decrease your pain.  After the initial series, we generally will wait several months before considering a repeat injection of the same type.  If you have any questions, please call [phone] [facility] 

## 2015-08-08 ENCOUNTER — Other Ambulatory Visit: Payer: Self-pay | Admitting: Physical Medicine & Rehabilitation

## 2015-08-23 ENCOUNTER — Encounter: Payer: Self-pay | Admitting: Physical Medicine & Rehabilitation

## 2015-08-23 ENCOUNTER — Encounter
Payer: Worker's Compensation | Attending: Physical Medicine & Rehabilitation | Admitting: Physical Medicine & Rehabilitation

## 2015-08-23 VITALS — BP 136/53 | HR 63 | Resp 14

## 2015-08-23 DIAGNOSIS — R51 Headache: Secondary | ICD-10-CM

## 2015-08-23 DIAGNOSIS — G4486 Cervicogenic headache: Secondary | ICD-10-CM

## 2015-08-23 DIAGNOSIS — G43019 Migraine without aura, intractable, without status migrainosus: Secondary | ICD-10-CM

## 2015-08-23 DIAGNOSIS — M5382 Other specified dorsopathies, cervical region: Secondary | ICD-10-CM | POA: Diagnosis not present

## 2015-08-23 DIAGNOSIS — F603 Borderline personality disorder: Secondary | ICD-10-CM | POA: Diagnosis not present

## 2015-08-23 DIAGNOSIS — S7002XA Contusion of left hip, initial encounter: Secondary | ICD-10-CM | POA: Insufficient documentation

## 2015-08-23 DIAGNOSIS — M542 Cervicalgia: Secondary | ICD-10-CM | POA: Insufficient documentation

## 2015-08-23 DIAGNOSIS — M791 Myalgia: Secondary | ICD-10-CM | POA: Diagnosis not present

## 2015-08-23 DIAGNOSIS — G43919 Migraine, unspecified, intractable, without status migrainosus: Secondary | ICD-10-CM | POA: Insufficient documentation

## 2015-08-23 DIAGNOSIS — G8929 Other chronic pain: Secondary | ICD-10-CM | POA: Insufficient documentation

## 2015-08-23 DIAGNOSIS — M797 Fibromyalgia: Secondary | ICD-10-CM

## 2015-08-23 DIAGNOSIS — M1288 Other specific arthropathies, not elsewhere classified, other specified site: Secondary | ICD-10-CM

## 2015-08-23 DIAGNOSIS — M129 Arthropathy, unspecified: Secondary | ICD-10-CM | POA: Insufficient documentation

## 2015-08-23 DIAGNOSIS — M47812 Spondylosis without myelopathy or radiculopathy, cervical region: Secondary | ICD-10-CM

## 2015-08-23 DIAGNOSIS — F4310 Post-traumatic stress disorder, unspecified: Secondary | ICD-10-CM | POA: Insufficient documentation

## 2015-08-23 DIAGNOSIS — G43711 Chronic migraine without aura, intractable, with status migrainosus: Secondary | ICD-10-CM

## 2015-08-23 DIAGNOSIS — M7918 Myalgia, other site: Secondary | ICD-10-CM

## 2015-08-23 DIAGNOSIS — S7002XS Contusion of left hip, sequela: Secondary | ICD-10-CM

## 2015-08-23 MED ORDER — MEPERIDINE HCL 50 MG PO TABS: 50.0000 mg | ORAL_TABLET | Freq: Every day | ORAL | Status: DC | PRN

## 2015-08-23 MED ORDER — NAPROXEN 500 MG PO TABS: ORAL_TABLET | ORAL | Status: DC

## 2015-08-23 MED ORDER — METHADONE HCL 10 MG PO TABS: 20.0000 mg | ORAL_TABLET | Freq: Three times a day (TID) | ORAL | Status: DC

## 2015-08-23 NOTE — Patient Instructions (Addendum)
PLEASE CALL ME WITH ANY PROBLEMS OR QUESTIONS (#[phone]).       Iliac Crest Contusion An iliac crest contusion is a deep bruise of your hip bone (hip pointer). Contusions are the result of an injury that caused bleeding under the skin. The contusion may turn blue, purple, or yellow. Minor injuries will give you a painless contusion, but more severe contusions may stay painful and swollen for a few weeks.  CAUSES  An iliac crest contusion is usually caused by a blow to the top of your hip pointer. The injury most often comes from contact sports.  SYMPTOMS   Swelling and redness of the hip area.  Bruising of the hip area.  Tenderness or soreness of the hip. DIAGNOSIS  The diagnosis can be made by taking your history and doing a physical exam. You may need an X-ray of your hip pointer to look for a broken bone (fracture). TREATMENT  Often, the best treatment for an iliac crest contusion is resting, icing, elevating, and applying cold compresses to the injured area. Over-the-counter medicines may also be recommended for pain control. Crutches may be used if walking is very painful. Some people need physical therapy to help with range of motion and strengthening.  HOME CARE INSTRUCTIONS   Put ice on the injured area.  Put ice in a plastic bag.  Place a towel between your skin and the bag.  Leave the ice on for 15-20 minutes, 03-04 times a day.  Only take over-the-counter or prescription medicines for pain, discomfort, or fever as directed by your caregiver. Your caregiver may recommend avoiding anti-inflammatory medicines (aspirin, ibuprofen, and naproxen) for 48 hours because these medicines may increase bruising.  Keep your leg straightened (extended) when possible.  Walk or move around as the pain allows, or as directed by your caregiver. Use crutches if your caregiver recommends them.  Apply compression wraps as directed by your caregiver. You may remove the wraps for  sleeping, showers, and baths. SEEK IMMEDIATE MEDICAL CARE IF:   You have increased bruising or swelling.  You have pain that is getting worse.  Your swelling or pain is not relieved with medicines.  Your toes get cold. MAKE SURE YOU:   Understand these instructions.  Will watch your condition.  Will get help right away if you are not doing well or get worse.   This information is not intended to replace advice given to you by your health care provider. Make sure you discuss any questions you have with your health care provider.   Document Released: 04/23/2001 Document Revised: 10/21/2011 Document Reviewed: 12/14/2014 [employer] Interactive Patient Education [employer]

## 2015-08-23 NOTE — Progress Notes (Signed)
Subjective:    Patient ID: Monica [name], female    DOB: 05-Feb-1951, 65 y.o.   MRN: [id number]  HPI   Monica [name] is here in follow up of her chronic migraines. She had a good holiday with her family. She did have a fall just after Thanksgiving. She landed on her left hip and has had pain since then. She has been using her naproxen which has helped. She seems to have hit the edge of her couch. She may have lost consciousness for a few minutes as well although she did not hit her head. She fell likely because she passed out. (she went "black")---she was checked out at the hospital and work up was negative per patient.   Monica [name] has had good results with the cervical RF's---her neck pain is SUBSTANTIALLY improved. Dr. Laban EmperorNaveira is looking at C2 left ESI to help with some of her occipital/suboccipital nerve pain as well.   Monica [name] maintains on her current medications as prescribed which seemed to help keep her pain in check.   Pain Inventory Average Pain 6 Pain Right Now 6 My pain is constant, burning and aching  In the last 24 hours, has pain interfered with the following? General activity 7 Relation with others 8 Enjoyment of life 8 What TIME of day is your pain at its worst? daytime [name] (in general) Poor  Pain is worse with: bending Pain improves with: rest, heat/ice, pacing activities and medication Relief from Meds: 5  Mobility walk without assistance ability to climb steps?  yes do you drive?  yes  Function disabled: date disabled .  Neuro/Psych No problems in this area  Prior Studies Any changes since last visit?  no  Physicians involved in your care Any changes since last visit?  no   Family History  Problem Relation Age of Onset  . Colon cancer Mother [id number]   Social History   Social History  . Marital Status: Married    Spouse Name: N/A  . Number of Children: 3  . Years of Education: N/A   Occupational History  . disabled    Social History Main Topics  .  Smoking status: Never Smoker   . Smokeless tobacco: Never Used  . Alcohol Use: No  . Drug Use: No  . Sexual Activity: Yes    Birth Control/ Protection: Post-menopausal   Other Topics Concern  . None   Social History Narrative   Past Surgical History  Procedure Laterality Date  . Colonoscopy  11/17/08    ext hemorrhoids  . Gastro empy study  03/03/10    normal  . Esophagogastroduodenoscopy  04/05/10    [name] (51F),erosive reflux esophagitis, small hiatal hernia,  . Cholecystectomy    . Neck surgery  1998  . Hand surgery    . Breast lumpectomy      left  . Craniotomy      secondary TBI  . Nephrolithotomy  05/13/2012    Procedure: NEPHROLITHOTOMY PERCUTANEOUS;  Surgeon: Marcine MatarStephen Dahlstedt, MD;  Location: [facility];  Service: Urology;  Laterality: Left;     . Finger surgery      removal cyst left pinky   Past Medical History  Diagnosis Date  . Chronic nausea     normal GES   . Hemorrhoids 11/17/08    Colonoscopy Dr Karilyn Cotaehman  . Chronic headaches     Dr Hermelinda MedicusSchwartz [place] [facility]  . PTSD (post-traumatic stress disorder)   . DM (diabetes mellitus) (HCC)   . Torticollis   .  Elevated liver enzymes     hx of, normal 03/2010  . Headache(784.0)   . Migraine without aura, with intractable migraine, so stated, without mention of status migrainosus   . Unspecified musculoskeletal disorders and symptoms referable to neck     cervical/trapezius  . Occipital neuralgia   . Posttraumatic stress disorder   . Torticollis   . Cervicalgia   . Complication of anesthesia     had problem with local anesthesia-tried to assist Physician  . Renal lithiasis   . Glaucoma   . Seizure (HCC)     12 yrs ago-med related  . Arthritis   . Cervical facet joint syndrome 04/22/2012   BP 136/53 mmHg  Pulse 63  Resp 14  SpO2 96%  Opioid Risk Score:   Fall Risk Score:  `1  Depression screen PHQ 2/9  Depression screen [facility] 2/9 08/23/2015 07/26/2015 06/13/2015 02/24/2015 05/13/2012  Decreased  Interest 1 0 0 2 0  Down, Depressed, Hopeless 1 0 0 1 0  PHQ - 2 Score 2 0 0 3 0  Altered sleeping 2 - - 1 -  Tired, decreased energy 1 - - 1 -  Change in appetite 1 - - 3 -  Feeling bad or failure about yourself  1 - - 1 -  Trouble concentrating 1 - - 2 -  Moving slowly or fidgety/restless 1 - - 0 -  Suicidal thoughts 0 - - 0 -  PHQ-9 Score 9 - - 11 -     Review of Systems  All other systems reviewed and are negative.      Objective:   Physical Exam  normal motor tone is noted in both LE's . DTR's appear to be 1+ to 2+ throughout. Muscle testing reveals 5/5 muscle strength of the upper extremity, and 5/5 of the lower extremity. Full range of motion in upper and lower extremities. Cervical ROM is improved in all planes. Only mild tenderness with Palpation in the cervical region today  Neuro: Fine motor movements are normal in both hands.  Sensory is intact and symmetric to light touch, pinprick and proprioception.  DTR in the upper and lower extremity are present and symmetric 2+. No clonus is noted.  Patient arises from chair without difficulty. Tandem walk is stable.  M/S: pain with palpation over the left iliac crest. Neck rom is improved Psych: in good spirits today    Assessment & Plan:   Assessment:  1. 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  2. Posttraumatic stress syndrome.  3. Borderline Personality--hx of  4. Left hip pointer.    PLAN:  1. Continue methadone rx. Continue demerol 50 mg qd prn for severe pain. Second rx's were given for next month  2. Naproxen-- prn.  3. Continue topamax at full dose. She is willing to accept the potential risk for stones.  4. Maintain cervical range of motion/posture exercises  5. Continue k-tape, aromatherapy, and cervical traction also. May be a candidate for botox for migraines--will consider as we move forward this year. I agree with the  cervical 2 ESI as suggested by Dr. Laban Emperor. 6. 15 minutes of face to face patient care time were spent during this visit. All questions were encouraged and answered. I'll see her back in about 2 months.

## 2015-08-31 ENCOUNTER — Encounter: Payer: Self-pay | Admitting: Pain Medicine

## 2015-08-31 ENCOUNTER — Ambulatory Visit: Payer: Worker's Compensation | Attending: Pain Medicine | Admitting: Pain Medicine

## 2015-08-31 VITALS — BP 128/67 | HR 57 | Temp 98.3°F | Resp 16 | Ht 64.0 in | Wt 147.0 lb

## 2015-08-31 DIAGNOSIS — M47812 Spondylosis without myelopathy or radiculopathy, cervical region: Secondary | ICD-10-CM | POA: Diagnosis not present

## 2015-08-31 DIAGNOSIS — R1084 Generalized abdominal pain: Secondary | ICD-10-CM | POA: Insufficient documentation

## 2015-08-31 DIAGNOSIS — M542 Cervicalgia: Secondary | ICD-10-CM | POA: Diagnosis not present

## 2015-08-31 DIAGNOSIS — S7002XA Contusion of left hip, initial encounter: Secondary | ICD-10-CM | POA: Insufficient documentation

## 2015-08-31 DIAGNOSIS — Z9889 Other specified postprocedural states: Secondary | ICD-10-CM | POA: Diagnosis not present

## 2015-08-31 DIAGNOSIS — M791 Myalgia: Secondary | ICD-10-CM | POA: Diagnosis not present

## 2015-08-31 DIAGNOSIS — M5412 Radiculopathy, cervical region: Secondary | ICD-10-CM | POA: Diagnosis not present

## 2015-08-31 DIAGNOSIS — F603 Borderline personality disorder: Secondary | ICD-10-CM | POA: Insufficient documentation

## 2015-08-31 DIAGNOSIS — K219 Gastro-esophageal reflux disease without esophagitis: Secondary | ICD-10-CM | POA: Diagnosis not present

## 2015-08-31 DIAGNOSIS — G9619 Other disorders of meninges, not elsewhere classified: Secondary | ICD-10-CM | POA: Insufficient documentation

## 2015-08-31 DIAGNOSIS — G43919 Migraine, unspecified, intractable, without status migrainosus: Secondary | ICD-10-CM | POA: Diagnosis not present

## 2015-08-31 DIAGNOSIS — M4802 Spinal stenosis, cervical region: Secondary | ICD-10-CM | POA: Insufficient documentation

## 2015-08-31 DIAGNOSIS — F329 Major depressive disorder, single episode, unspecified: Secondary | ICD-10-CM | POA: Diagnosis not present

## 2015-08-31 DIAGNOSIS — G8929 Other chronic pain: Secondary | ICD-10-CM | POA: Insufficient documentation

## 2015-08-31 DIAGNOSIS — R51 Headache: Secondary | ICD-10-CM | POA: Diagnosis present

## 2015-08-31 DIAGNOSIS — X58XXXA Exposure to other specified factors, initial encounter: Secondary | ICD-10-CM | POA: Insufficient documentation

## 2015-08-31 DIAGNOSIS — M47892 Other spondylosis, cervical region: Secondary | ICD-10-CM | POA: Insufficient documentation

## 2015-08-31 MED ORDER — SODIUM CHLORIDE 0.9 % IJ SOLN
1.0000 mL | Freq: Once | INTRAMUSCULAR | Status: AC
  Administered 2015-08-31: 1 mL

## 2015-08-31 MED ORDER — ROPIVACAINE HCL 2 MG/ML IJ SOLN
INTRAMUSCULAR | Status: AC
  Administered 2015-08-31: 1 mL via EPIDURAL
  Filled 2015-08-31: qty 10

## 2015-08-31 MED ORDER — FENTANYL CITRATE (PF) 100 MCG/2ML IJ SOLN
INTRAMUSCULAR | Status: AC
  Filled 2015-08-31: qty 2

## 2015-08-31 MED ORDER — IOHEXOL 180 MG/ML  SOLN
INTRAMUSCULAR | Status: AC
  Administered 2015-08-31: 5 mL via EPIDURAL
  Filled 2015-08-31: qty 20

## 2015-08-31 MED ORDER — ROPIVACAINE HCL 2 MG/ML IJ SOLN
1.0000 mL | Freq: Once | INTRAMUSCULAR | Status: AC
  Administered 2015-08-31: 1 mL via EPIDURAL

## 2015-08-31 MED ORDER — DEXAMETHASONE SODIUM PHOSPHATE 10 MG/ML IJ SOLN
INTRAMUSCULAR | Status: AC
  Administered 2015-08-31: 10 mg
  Filled 2015-08-31: qty 1

## 2015-08-31 MED ORDER — FENTANYL CITRATE (PF) 100 MCG/2ML IJ SOLN: 100.0000 ug | Freq: Once | INTRAMUSCULAR | Status: DC

## 2015-08-31 MED ORDER — SODIUM CHLORIDE 0.9 % IJ SOLN
INTRAMUSCULAR | Status: AC
  Administered 2015-08-31: 1 mL
  Filled 2015-08-31: qty 10

## 2015-08-31 MED ORDER — IOHEXOL 180 MG/ML  SOLN
5.0000 mL | Freq: Once | INTRAMUSCULAR | Status: AC | PRN
Start: 2015-08-31 — End: 2015-08-31
  Administered 2015-08-31: 5 mL via EPIDURAL

## 2015-08-31 MED ORDER — DEXAMETHASONE SODIUM PHOSPHATE 10 MG/ML IJ SOLN
10.0000 mg | Freq: Once | INTRAMUSCULAR | Status: AC
  Administered 2015-08-31: 10 mg

## 2015-08-31 MED ORDER — LIDOCAINE HCL (PF) 1 % IJ SOLN
10.0000 mL | Freq: Once | INTRAMUSCULAR | Status: AC
  Administered 2015-08-31: 10 mL via INTRADERMAL

## 2015-08-31 MED ORDER — MIDAZOLAM HCL 5 MG/5ML IJ SOLN
INTRAMUSCULAR | Status: AC
  Administered 2015-08-31: 2 mg via INTRAVENOUS
  Filled 2015-08-31: qty 5

## 2015-08-31 MED ORDER — LIDOCAINE HCL (PF) 1 % IJ SOLN
INTRAMUSCULAR | Status: AC
  Administered 2015-08-31: 10 mL via INTRADERMAL
  Filled 2015-08-31: qty 5

## 2015-08-31 MED ORDER — MIDAZOLAM HCL 5 MG/5ML IJ SOLN
5.0000 mg | Freq: Once | INTRAMUSCULAR | Status: AC
  Administered 2015-08-31: 2 mg via INTRAVENOUS

## 2015-08-31 MED ORDER — LACTATED RINGERS IV SOLN: 1000.0000 mL | INTRAVENOUS | Status: AC

## 2015-08-31 NOTE — Progress Notes (Signed)
Safety precautions to be maintained throughout the outpatient stay will include: orient to surroundings, keep bed in low position, maintain call bell within reach at all times, provide assistance with transfer out of bed and ambulation.  

## 2015-08-31 NOTE — Patient Instructions (Signed)
Epidural Steroid Injection Patient Information  Description: The epidural space surrounds the nerves as they exit the spinal cord.  In some patients, the nerves can be compressed and inflamed by a bulging disc or a tight spinal canal (spinal stenosis).  By injecting steroids into the epidural space, we can bring irritated nerves into direct contact with a potentially helpful medication.  These steroids act directly on the irritated nerves and can reduce swelling and inflammation which often leads to decreased pain.  Epidural steroids may be injected anywhere along the spine and from the neck to the low back depending upon the location of your pain.   After numbing the skin with local anesthetic (like Novocaine), a small needle is passed into the epidural space slowly.  You may experience a sensation of pressure while this is being done.  The entire block usually last less than 10 minutes.  Conditions which may be treated by epidural steroids:   Low back and leg pain  Neck and arm pain  Spinal stenosis  Post-laminectomy syndrome  Herpes zoster (shingles) pain  Pain from compression fractures  Preparation for the injection:  1. Do not eat any solid food or dairy products within 6 hours of your appointment.  2. You may drink clear liquids up to 2 hours before appointment.  Clear liquids include water, black coffee, juice or soda.  No milk or cream please. 3. You may take your regular medication, including pain medications, with a sip of water before your appointment  Diabetics should hold regular insulin (if taken separately) and take 1/2 normal NPH dos the morning of the procedure.  Carry some sugar containing items with you to your appointment. 4. A driver must accompany you and be prepared to drive you home after your procedure.  5. Bring all your current medications with your. 6. An IV may be inserted and sedation may be given at the discretion of the physician.   7. A blood pressure  cuff, EKG and other monitors will often be applied during the procedure.  Some patients may need to have extra oxygen administered for a short period. 8. You will be asked to provide medical information, including your allergies, prior to the procedure.  We must know immediately if you are taking blood thinners (like Coumadin/Warfarin)  Or if you are allergic to IV iodine contrast (dye). We must know if you could possible be pregnant.  Possible side-effects:  Bleeding from needle site  Infection (rare, may require surgery)  Nerve injury (rare)  Numbness & tingling (temporary)  Difficulty urinating (rare, temporary)  Spinal headache ( a headache worse with upright posture)  Light -headedness (temporary)  Pain at injection site (several days)  Decreased blood pressure (temporary)  Weakness in arm/leg (temporary)  Pressure sensation in back/neck (temporary)  Call if you experience:  Fever/chills associated with headache or increased back/neck pain.  Headache worsened by an upright position.  New onset weakness or numbness of an extremity below the injection site  Hives or difficulty breathing (go to the emergency room)  Inflammation or drainage at the infection site  Severe back/neck pain  Any new symptoms which are concerning to you  Please note:  Although the local anesthetic injected can often make your back or neck feel good for several hours after the injection, the pain will likely return.  It takes 3-7 days for steroids to work in the epidural space.  You may not notice any pain relief for at least that one week.    If effective, we will often do a series of three injections spaced 3-6 weeks apart to maximally decrease your pain.  After the initial series, we generally will wait several months before considering a repeat injection of the same type.  If you have any questions, please call [phone] [facility] 

## 2015-08-31 NOTE — Progress Notes (Signed)
Patient's Name: Monica [name] MRN: [id number] DOB: [birth year] DOS: 08/31/2015  Primary Reason(s) for Visit: Interventional Pain Management Treatment. CC: Headache   Pre-Procedure Assessment:  Monica [name] is a 65 y.o. year old, female patient, seen today for interventional treatment. 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 Her primarily concern today is the Headache   Reported Pain Score: 3  (6/10 at the time of arrival) Reported level of pain is compatible with clinical observation Pain Type: Chronic pain Pain Location: Head Pain Descriptors / Indicators: Aching Pain Frequency: Constant  Date of Last Visit: 08/23/15 Service Provided on Last Visit: Evaluation  Verification of the correct person, correct site (including marking of site), and correct procedure were performed and confirmed by the patient.  Today's Vitals   08/31/15 1112 08/31/15 1122 08/31/15 1136 08/31/15 1140  BP: 136/88 118/69 116/69 128/67  Pulse: 62 50 54 57  Temp:      Resp: _0 Height:      Weight:      SpO2: 91% 97% 98% 96%  PainSc: 0-No pain   3   PainLoc:      Calculated BMI: Body mass index is 25.22 kg/(m^2). Allergies: She is allergic to hydromorphone hcl;  aspirin; capsaicin; depakote; milnacipran hcl; pristiq; and zanaflex.. Primary Diagnosis: Chronic radicular cervical pain [M54.12, G89.29]  Procedure:  Type: Palliative, Inter-Laminar, Epidural Steroid Injection Region: Posterior Cervico-thoracic Region Level: C7-T1 Laterality: Left-Sided Paramedial  Indications: 1. Chronic cervical radicular pain (Left)   2. 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 Cervical spondylosis   4. 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  Consent: Secured. Under the influence of no sedatives a written informed consent was obtained, after having provided information on the risks and possible complications. To fulfill our ethical and legal obligations, as recommended by the [name], we have provided information to the patient about our clinical impression; the nature and purpose of the treatment or procedure; the risks, benefits, and possible complications of the intervention; alternatives; the risk(s) and benefit(s) of the alternative treatment(s) or procedure(s); and the risk(s) and benefit(s) of doing nothing. The patient was provided information about the risks and possible complications associated with the procedure. In the case of spinal procedures these may include, but are not limited to, failure to achieve desired goals, infection, bleeding, organ or nerve damage, allergic reactions, paralysis, and death. In addition, the patient was informed that Medicine is not an exact science;  therefore, there is also the possibility of unforeseen risks and possible complications that may result in  a catastrophic outcome. The patient indicated having understood very clearly. We have given the patient no guarantees and we have made no promises. Enough time was given to the patient to ask questions, all of which were answered to the patient's satisfaction.  Pre-Procedure Preparation: Safety Precautions: Allergies reviewed. Appropriate site, procedure, and patient were confirmed by following the Joint Commission's Universal Protocol (UP.01.01.01), in the form of a "Time Out". The patient was asked to confirm marked site and procedure, before commencing. The patient was asked about blood thinners, or active infections, both of which were denied. Patient was assessed for positional comfort and all pressure points were checked before starting procedure. Monitoring:  As per clinic protocol. Blood pressure monitor, ECG, Pulse Oxymetry, & Clinical Monitoring. Infection Control Precautions: Sterile technique used. Standard Universal Precautions were taken as recommended by the Department of [facility] for Disease Control and Prevention (CDC). Standard pre-surgical skin prep was conducted. Respiratory hygiene and cough etiquette was practiced. Hand hygiene observed. Safe injection practices and needle disposal techniques followed. SDV (single dose vial) medications used. Medications properly checked for expiration dates and contaminants. Personal protective equipment (PPE) used: Surgical mask. Sterile double glove technique. Radiation resistant gloves. Sterile surgical gloves.  Anesthesia, Analgesia, Anxiolysis:  Type: Moderate (Conscious) Sedation & Local Anesthesia Local Anesthetic: Lidocaine 1% Route: Intravenous (IV) IV Access: Secured Sedation: Meaningful verbal contact was maintained at all times during the procedure  Indication(s): Analgesia & Anxiolysis  Description of Procedure  Process:  Time-out: "Time-out" completed before starting procedure, as per protocol. Position: Prone with head of the table was raised to facilitate breathing. Target Area: For Epidural Steroid injections the target is the interlaminar space, initially targeting the lower border of the superior vertebral body lamina. Approach: Paramedial approach. Area Prepped: Entire PosteriorCervical Region Prepping solution: ChloraPrep (2% chlorhexidine gluconate and 70% isopropyl alcohol) Safety Precautions: Aspiration looking for blood return was conducted prior to all injections. At no point did we inject any substances, as a needle was being advanced. No attempts were made at seeking any paresthesias. Safe injection practices and needle disposal techniques used. Medications properly checked for expiration dates. SDV (single dose vial) medications used.   Description of the Procedure: Protocol guidelines were followed. The procedure needle was introduced through the skin, ipsilateral to the reported pain, and advanced to the target area. Bone was contacted and the needle walked caudad, until the lamina was cleared. The epidural space was identified using "loss-of-resistance technique" with 2-3 ml of PF-NaCl (0.9% NSS), in a 5cc LOR glass syringe. EBL: None Materials & Medications Used:  Needle(s) Used: 20g - 10cm, Tuohy-style epidural needle Medications Administered today: We administered midazolam, dexamethasone, iohexol, lidocaine (PF), sodium chloride, ropivacaine (PF) 2 mg/ml (0.2%), ropivacaine (PF) 2 mg/ml (0.2%), iohexol, lidocaine (PF), sodium chloride, dexamethasone, and midazolam.Please see chart orders for dosing details.  Imaging Guidance:  Type of Imaging Technique: Fluoroscopy Guidance (Spinal) Indication(s): Assistance in needle guidance and placement for procedures requiring needle placement in or near specific anatomical locations not easily accessible without such assistance. Exposure Time:  Please see nurses notes. Contrast: Before injecting any contrast, we confirmed that the patient did not have an allergy to iodine, shellfish, or radiological contrast. Once satisfactory needle placement was completed at the desired level, radiological contrast was injected. Injection was conducted under continuous fluoroscopic guidance. Injection of contrast accomplished without complications. See chart for type and volume of contrast used. Fluoroscopic Guidance: I was personally present in the fluoroscopy suite, where the patient was placed  in position for the procedure, over the fluoroscopy-compatible table. Fluoroscopy was manipulated, using "Tunnel Vision Technique", to obtain the best possible view of the target area, on the affected side. Parallax error was corrected before commencing the procedure. A "direction-depth-direction" technique was used to introduce the needle under continuous pulsed fluoroscopic guidance. Once the target was reached, antero-posterior, oblique, and lateral fluoroscopic projection views were taken to confirm needle placement in all planes. Permanently recorded images stored by scanning into EMR. Interpretation: Intraoperative imaging interpretation by performing Physician. Adequate needle placement confirmed. Needle placement confirmed in AP, lateral, & Oblique Views. Appropriate spread of contrast to desired area. No evidence of afferent or efferent intravascular uptake. No intrathecal or subarachnoid spread observed. Spread is WNL. No evidence of epidural restrictions. Permanent hardcopy images in multiple planes scanned into the patient's record.   Antibiotic Prophylaxis:  Type:  Antibiotics Given (last 72 hours)    None      Indication(s): No indications identified.  Post-operative Assessment:  Pain Score: 3  Complications: No immediate post-treatment complications observed. Disposition: The patient was discharged home, once institutional criteria were met.  Return to clinic in 2 weeks for follow-up evaluation and interpretation of results. The patient tolerated the entire procedure well. A repeat set of vitals were taken after the procedure and the patient was kept under observation following institutional policy, for this type of procedure. Post-procedural neurological assessment was performed, showing return to baseline, prior to discharge. The patient was provided with post-procedure discharge instructions, including a section on how to identify potential problems. Should any problems arise concerning this procedure, the patient was given instructions to immediately contact us, at any time, without hesitation. In any case, we plan to contact the patient by telephone for a follow-up status report regarding this interventional procedure. Comments:  No additional relevant information.  Primary Care Physician: Monico Blitz, MD Location: [facility] Note by: Kathlen Brunswick. Dossie Arbour, M.D, DABA, DABAPM, DABPM, DABIPP, FIPP  Disclaimer:  Medicine is not an exact science. The only guarantee in medicine is that nothing is guaranteed. It is important to note that the decision to proceed with this intervention was based on the information collected from the patient. The Data and conclusions were drawn from the patient's questionnaire, the interview, and the physical examination. Because the information was provided in large part by the patient, it cannot be guaranteed that it has not been purposely or unconsciously manipulated. Every effort has been made to obtain as much relevant data as possible for this evaluation. It is important to note that the conclusions that lead to this procedure are derived in large part from the available data. Always take into account that the treatment will also be dependent on availability of resources and existing treatment guidelines, considered by other Pain Management Practitioners as being common knowledge  and practice, at the time of the intervention. For Medico-Legal purposes, it is also important to point out that variation in procedural techniques and pharmacological choices are the acceptable norm. The indications, contraindications, technique, and results of the above procedure should only be interpreted and judged by a Board-Certified Interventional Pain Specialist with extensive familiarity and expertise in the same exact procedure and technique. Attempts at providing opinions without similar or greater experience and expertise than that of the treating physician will be considered as inappropriate and unethical, and shall result in a formal complaint to the state medical board and applicable specialty societies.

## 2015-09-01 ENCOUNTER — Telehealth: Payer: Self-pay | Admitting: *Deleted

## 2015-09-01 NOTE — Telephone Encounter (Signed)
Spoke with patient re; procedure on yesterday.  She did have some questions as to marking for procedure and where injection site actually was.  Educated patient that an epidural is always in the center of the back at the spine and the marking of the back was to indicate where pain is.  Patient verbalizes u/o information given and denies any complications or concerns from procedure.

## 2015-09-14 ENCOUNTER — Other Ambulatory Visit: Payer: Self-pay | Admitting: Physical Medicine & Rehabilitation

## 2015-09-14 NOTE — Telephone Encounter (Signed)
Please advise on refill? I do not see documentation to continue this medication.

## 2015-09-15 ENCOUNTER — Other Ambulatory Visit: Payer: Self-pay | Admitting: Physical Medicine & Rehabilitation

## 2015-09-22 DIAGNOSIS — E1165 Type 2 diabetes mellitus with hyperglycemia: Secondary | ICD-10-CM | POA: Diagnosis not present

## 2015-09-22 DIAGNOSIS — Z0000 Encounter for general adult medical examination without abnormal findings: Secondary | ICD-10-CM | POA: Diagnosis not present

## 2015-09-22 DIAGNOSIS — Z1211 Encounter for screening for malignant neoplasm of colon: Secondary | ICD-10-CM | POA: Diagnosis not present

## 2015-09-22 DIAGNOSIS — Z1389 Encounter for screening for other disorder: Secondary | ICD-10-CM | POA: Diagnosis not present

## 2015-09-22 DIAGNOSIS — Z6823 Body mass index (BMI) 23.0-23.9, adult: Secondary | ICD-10-CM | POA: Diagnosis not present

## 2015-09-22 DIAGNOSIS — Z418 Encounter for other procedures for purposes other than remedying health state: Secondary | ICD-10-CM | POA: Diagnosis not present

## 2015-09-26 DIAGNOSIS — Z79899 Other long term (current) drug therapy: Secondary | ICD-10-CM | POA: Diagnosis not present

## 2015-09-26 DIAGNOSIS — R5383 Other fatigue: Secondary | ICD-10-CM | POA: Diagnosis not present

## 2015-09-26 DIAGNOSIS — E7800 Pure hypercholesterolemia, unspecified: Secondary | ICD-10-CM | POA: Diagnosis not present

## 2015-10-02 ENCOUNTER — Ambulatory Visit: Payer: Worker's Compensation | Attending: Pain Medicine | Admitting: Pain Medicine

## 2015-10-02 ENCOUNTER — Encounter: Payer: Self-pay | Admitting: Pain Medicine

## 2015-10-02 VITALS — BP 137/66 | HR 87 | Temp 99.4°F | Resp 16 | Ht 64.0 in | Wt 148.0 lb

## 2015-10-02 DIAGNOSIS — M5481 Occipital neuralgia: Secondary | ICD-10-CM | POA: Diagnosis not present

## 2015-10-02 DIAGNOSIS — Z9889 Other specified postprocedural states: Secondary | ICD-10-CM | POA: Diagnosis not present

## 2015-10-02 DIAGNOSIS — Z5181 Encounter for therapeutic drug level monitoring: Secondary | ICD-10-CM | POA: Diagnosis not present

## 2015-10-02 DIAGNOSIS — R1084 Generalized abdominal pain: Secondary | ICD-10-CM | POA: Diagnosis not present

## 2015-10-02 DIAGNOSIS — M5382 Other specified dorsopathies, cervical region: Secondary | ICD-10-CM

## 2015-10-02 DIAGNOSIS — G43909 Migraine, unspecified, not intractable, without status migrainosus: Secondary | ICD-10-CM | POA: Insufficient documentation

## 2015-10-02 DIAGNOSIS — Z5189 Encounter for other specified aftercare: Secondary | ICD-10-CM

## 2015-10-02 DIAGNOSIS — G8929 Other chronic pain: Secondary | ICD-10-CM | POA: Diagnosis not present

## 2015-10-02 DIAGNOSIS — M4690 Unspecified inflammatory spondylopathy, site unspecified: Secondary | ICD-10-CM | POA: Diagnosis not present

## 2015-10-02 DIAGNOSIS — M1288 Other specific arthropathies, not elsewhere classified, other specified site: Secondary | ICD-10-CM | POA: Diagnosis not present

## 2015-10-02 DIAGNOSIS — K219 Gastro-esophageal reflux disease without esophagitis: Secondary | ICD-10-CM | POA: Insufficient documentation

## 2015-10-02 DIAGNOSIS — R52 Pain, unspecified: Secondary | ICD-10-CM

## 2015-10-02 DIAGNOSIS — M542 Cervicalgia: Secondary | ICD-10-CM | POA: Diagnosis present

## 2015-10-02 DIAGNOSIS — G4486 Cervicogenic headache: Secondary | ICD-10-CM

## 2015-10-02 DIAGNOSIS — F1190 Opioid use, unspecified, uncomplicated: Secondary | ICD-10-CM

## 2015-10-02 DIAGNOSIS — G9619 Other disorders of meninges, not elsewhere classified: Secondary | ICD-10-CM | POA: Diagnosis not present

## 2015-10-02 DIAGNOSIS — F329 Major depressive disorder, single episode, unspecified: Secondary | ICD-10-CM | POA: Diagnosis not present

## 2015-10-02 DIAGNOSIS — R0683 Snoring: Secondary | ICD-10-CM | POA: Insufficient documentation

## 2015-10-02 DIAGNOSIS — R51 Headache: Secondary | ICD-10-CM | POA: Insufficient documentation

## 2015-10-02 DIAGNOSIS — F603 Borderline personality disorder: Secondary | ICD-10-CM | POA: Diagnosis not present

## 2015-10-02 DIAGNOSIS — Z79891 Long term (current) use of opiate analgesic: Secondary | ICD-10-CM | POA: Diagnosis not present

## 2015-10-02 DIAGNOSIS — M47812 Spondylosis without myelopathy or radiculopathy, cervical region: Secondary | ICD-10-CM

## 2015-10-02 DIAGNOSIS — F4310 Post-traumatic stress disorder, unspecified: Secondary | ICD-10-CM | POA: Diagnosis not present

## 2015-10-02 NOTE — Patient Instructions (Signed)
GENERAL RISKS AND COMPLICATIONS  What are the risk, side effects and possible complications? Generally speaking, most procedures are safe.  However, with any procedure there are risks, side effects, and the possibility of complications.  The risks and complications are dependent upon the sites that are lesioned, or the type of nerve block to be performed.  The closer the procedure is to the spine, the more serious the risks are.  Great care is taken when placing the radio frequency needles, block needles or lesioning probes, but sometimes complications can occur. 1. Infection: Any time there is an injection through the skin, there is a risk of infection.  This is why sterile conditions are used for these blocks.  There are four possible types of infection. 1. Localized skin infection. 2. Central Nervous System Infection-This can be in the form of Meningitis, which can be deadly. 3. Epidural Infections-This can be in the form of an epidural abscess, which can cause pressure inside of the spine, causing compression of the spinal cord with subsequent paralysis. This would require an emergency surgery to decompress, and there are no guarantees that the patient would recover from the paralysis. 4. Discitis-This is an infection of the intervertebral discs.  It occurs in about 1% of discography procedures.  It is difficult to treat and it may lead to surgery.        2. Pain: the needles have to go through skin and soft tissues, will cause soreness.       3. Damage to internal structures:  The nerves to be lesioned may be near blood vessels or    other nerves which can be potentially damaged.       4. Bleeding: Bleeding is more common if the patient is taking blood thinners such as  aspirin, Coumadin, Ticiid, Plavix, etc., or if he/she have some genetic predisposition  such as hemophilia. Bleeding into the spinal canal can cause compression of the spinal  cord with subsequent paralysis.  This would require an  emergency surgery to  decompress and there are no guarantees that the patient would recover from the  paralysis.       5. Pneumothorax:  Puncturing of a lung is a possibility, every time a needle is introduced in  the area of the chest or upper back.  Pneumothorax refers to free air around the  collapsed lung(s), inside of the thoracic cavity (chest cavity).  Another two possible  complications related to a similar event would include: Hemothorax and Chylothorax.   These are variations of the Pneumothorax, where instead of air around the collapsed  lung(s), you may have blood or chyle, respectively.       6. Spinal headaches: They may occur with any procedures in the area of the spine.       7. Persistent CSF (Cerebro-Spinal Fluid) leakage: This is a rare problem, but may occur  with prolonged intrathecal or epidural catheters either due to the formation of a fistulous  track or a dural tear.       8. Nerve damage: By working so close to the spinal cord, there is always a possibility of  nerve damage, which could be as serious as a permanent spinal cord injury with  paralysis.       9. Death:  Although rare, severe deadly allergic reactions known as "Anaphylactic  reaction" can occur to any of the medications used.      10. Worsening of the symptoms:  We can always make thing worse.    What are the chances of something like this happening? Chances of any of this occuring are extremely low.  By statistics, you have more of a chance of getting killed in a motor vehicle accident: while driving to the hospital than any of the above occurring .  Nevertheless, you should be aware that they are possibilities.  In general, it is similar to taking a shower.  Everybody knows that you can slip, hit your head and get killed.  Does that mean that you should not shower again?  Nevertheless always keep in mind that statistics do not mean anything if you happen to be on the wrong side of them.  Even if a procedure has a 1  (one) in a 1,000,000 (million) chance of going wrong, it you happen to be that one..Also, keep in mind that by statistics, you have more of a chance of having something go wrong when taking medications.  Who should not have this procedure? If you are on a blood thinning medication (e.g. Coumadin, Plavix, see list of "Blood Thinners"), or if you have an active infection going on, you should not have the procedure.  If you are taking any blood thinners, please inform your physician.  How should I prepare for this procedure?  Do not eat or drink anything at least six hours prior to the procedure.  Bring a driver with you .  It cannot be a taxi.  Come accompanied by an adult that can drive you back, and that is strong enough to help you if your legs get weak or numb from the local anesthetic.  Take all of your medicines the morning of the procedure with just enough water to swallow them.  If you have diabetes, make sure that you are scheduled to have your procedure done first thing in the morning, whenever possible.  If you have diabetes, take only half of your insulin dose and notify our nurse that you have done so as soon as you arrive at the clinic.  If you are diabetic, but only take blood sugar pills (oral hypoglycemic), then do not take them on the morning of your procedure.  You may take them after you have had the procedure.  Do not take aspirin or any aspirin-containing medications, at least eleven (11) days prior to the procedure.  They may prolong bleeding.  Wear loose fitting clothing that may be easy to take off and that you would not mind if it got stained with Betadine or blood.  Do not wear any jewelry or perfume  Remove any nail coloring.  It will interfere with some of our monitoring equipment.  NOTE: Remember that this is not meant to be interpreted as a complete list of all possible complications.  Unforeseen problems may occur.  BLOOD THINNERS The following drugs  contain aspirin or other products, which can cause increased bleeding during surgery and should not be taken for 2 weeks prior to and 1 week after surgery.  If you should need take something for relief of minor pain, you may take acetaminophen which is found in Tylenol,m Datril, Anacin-3 and Panadol. It is not blood thinner. The products listed below are.  Do not take any of the products listed below in addition to any listed on your instruction sheet.  A.P.C or A.P.C with Codeine Codeine Phosphate Capsules #3 Ibuprofen Ridaura  ABC compound Congesprin Imuran rimadil  Advil Cope Indocin Robaxisal  Alka-Seltzer Effervescent Pain Reliever and Antacid Coricidin or Coricidin-D  Indomethacin Rufen    Alka-Seltzer plus Cold Medicine Cosprin Ketoprofen S-A-C Tablets  Anacin Analgesic Tablets or Capsules Coumadin Korlgesic Salflex  Anacin Extra Strength Analgesic tablets or capsules CP-2 Tablets Lanoril Salicylate  Anaprox Cuprimine Capsules Levenox Salocol  Anexsia-D Dalteparin Magan Salsalate  Anodynos Darvon compound Magnesium Salicylate Sine-off  Ansaid Dasin Capsules Magsal Sodium Salicylate  Anturane Depen Capsules Marnal Soma  APF Arthritis pain formula Dewitt's Pills Measurin Stanback  Argesic Dia-Gesic Meclofenamic Sulfinpyrazone  Arthritis Bayer Timed Release Aspirin Diclofenac Meclomen Sulindac  Arthritis pain formula Anacin Dicumarol Medipren Supac  Analgesic (Safety coated) Arthralgen Diffunasal Mefanamic Suprofen  Arthritis Strength Bufferin Dihydrocodeine Mepro Compound Suprol  Arthropan liquid Dopirydamole Methcarbomol with Aspirin Synalgos  ASA tablets/Enseals Disalcid Micrainin Tagament  Ascriptin Doan's Midol Talwin  Ascriptin A/D Dolene Mobidin Tanderil  Ascriptin Extra Strength Dolobid Moblgesic Ticlid  Ascriptin with Codeine Doloprin or Doloprin with Codeine Momentum Tolectin  Asperbuf Duoprin Mono-gesic Trendar  Aspergum Duradyne Motrin or Motrin IB Triminicin  Aspirin  plain, buffered or enteric coated Durasal Myochrisine Trigesic  Aspirin Suppositories Easprin Nalfon Trillsate  Aspirin with Codeine Ecotrin Regular or Extra Strength Naprosyn Uracel  Atromid-S Efficin Naproxen Ursinus  Auranofin Capsules Elmiron Neocylate Vanquish  Axotal Emagrin Norgesic Verin  Azathioprine Empirin or Empirin with Codeine Normiflo Vitamin E  Azolid Emprazil Nuprin Voltaren  Bayer Aspirin plain, buffered or children's or timed BC Tablets or powders Encaprin Orgaran Warfarin Sodium  Buff-a-Comp Enoxaparin Orudis Zorpin  Buff-a-Comp with Codeine Equegesic Os-Cal-Gesic   Buffaprin Excedrin plain, buffered or Extra Strength Oxalid   Bufferin Arthritis Strength Feldene Oxphenbutazone   Bufferin plain or Extra Strength Feldene Capsules Oxycodone with Aspirin   Bufferin with Codeine Fenoprofen Fenoprofen Pabalate or Pabalate-SF   Buffets II Flogesic Panagesic   Buffinol plain or Extra Strength Florinal or Florinal with Codeine Panwarfarin   Buf-Tabs Flurbiprofen Penicillamine   Butalbital Compound Four-way cold tablets Penicillin   Butazolidin Fragmin Pepto-Bismol   Carbenicillin Geminisyn Percodan   Carna Arthritis Reliever Geopen Persantine   Carprofen Gold's salt Persistin   Chloramphenicol Goody's Phenylbutazone   Chloromycetin Haltrain Piroxlcam   Clmetidine heparin Plaquenil   Cllnoril Hyco-pap Ponstel   Clofibrate Hydroxy chloroquine Propoxyphen         Before stopping any of these medications, be sure to consult the physician who ordered them.  Some, such as Coumadin (Warfarin) are ordered to prevent or treat serious conditions such as "deep thrombosis", "pumonary embolisms", and other heart problems.  The amount of time that you may need off of the medication may also vary with the medication and the reason for which you were taking it.  If you are taking any of these medications, please make sure you notify your pain physician before you undergo any  procedures.         Occipital Nerve Block Patient Information  Description: The occipital nerves originate in the cervical (neck) spinal cord and travel upward through muscle and tissue to supply sensation to the back of the head and top of the scalp.  In addition, the nerves control some of the muscles of the scalp.  Occipital neuralgia is an irritation of these nerves which can cause headaches, numbness of the scalp, and neck discomfort.     The occipital nerve block will interrupt nerve transmission through these nerves and can relieve pain and spasm.  The block consists of insertion of a small needle under the skin in the back of the head to deposit local anesthetic (numbing medicine) and/or steroids around the nerve.  The  entire block usually lasts less than 5 minutes.  Conditions which may be treated by occipital blocks:   Muscular pain and spasm of the scalp  Nerve irritation, back of the head  Headaches  Upper neck pain  Preparation for the injection:  12. Do not eat any solid food or dairy products within 6 hours of your appointment. 13. You may drink clear liquids up to 2 hours before appointment.  Clear liquids include water, black coffee, juice or soda.  No milk or cream please. 14. You may take your regular medication, including pain medications, with a sip of water before you appointment.  Diabetics should hold regular insulin (if taken separately) and take 1/2 normal NPH dose the morning of the procedure.  Carry some sugar containing items with you to your appointment. 15. A driver must accompany you and be prepared to drive you home after your procedure. 16. Bring all your current medications with you. 17. An IV may be inserted and sedation may be given at the discretion of the physician. 18. A blood pressure cuff, EKG, and other monitors will often be applied during the procedure.  Some patients may need to have extra oxygen administered for a short period. 19. You  will be asked to provide medical information, including your allergies and medications, prior to the procedure.  We must know immediately if you are taking blood thinners (like Coumadin/Warfarin) or if you are allergic to IV iodine contrast (dye).  We must know if you could possible be pregnant.  20. Do not wear a high collared shirt or turtleneck.  Tie long hair up in the back if possible.  Possible side-effects:   Bleeding from needle site  Infection (rare, may require surgery)  Nerve injury (rare)  Hair on back of neck can be tinged with iodine scrub (this will wash out)  Light-headedness (temporary)  Pain at injection site (several days)  Decreased blood pressure (rare, temporary)  Seizure (very rare)  Call if you experience:   Hives or difficulty breathing ( go to the emergency room)  Inflammation or drainage at the injection site(s)  Please note:  Although the local anesthetic injected can often make your painful muscles or headache feel good for several hours after the injection, the pain may return.  It takes 3-7 days for steroids to work.  You may not notice any pain relief for at least one week.  If effective, we will often do a series of injections spaced 3-6 weeks apart to maximally decrease your pain.  If you have any questions, please call [phone] [facility]

## 2015-10-02 NOTE — Progress Notes (Signed)
Patient's Name: Monica [name] MRN: [id number] DOB: [birth year] DOS: 10/02/2015  Primary Reason(s) for Visit: Post-Procedure evaluation CC: Neck Pain and Headache   HPI  Monica [name] is a 65 y.o. year old, female patient, who returns today as an established patient. She has Generalized abdominal pain; GERD (gastroesophageal reflux disease); Headache, migraine, intractable; Depression; Borderline personality disorder; Snoring; Dyspnea; Myofascial muscle pain; Cervical spondylosis; Cervical facet syndrome (Location of Secondary source of pain) (Bilateral) (L>R); Chronic pain; Cervicogenic headache (Location of Primary Source of Pain) (Bilateral) (L>R); Chronic neck pain (Location of Secondary source of pain) (Bilateral) (L>R); Long term current use of opiate analgesic; Long term prescription opiate use; Opiate use (600 MME/Day); Opiate dependence (HCC); Long-term current use of methadone for opiate dependence (HCC); 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 Her primarily concern today is the Neck Pain and Headache   Today the patient comes in today clinics for postprocedure evaluation. On 06/15/2015 the patient came in to our service as a "FastTrak" for a left-sided C3, C4, C5, and C6 medial branch radiofrequency ablation. The patient was seen on follow-up on 07/26/2015 and she indicated having attained 0% relief of the duration of local anesthetic, 0% long-term relief of the cervicogenic headaches, but with 100% relief of the neck pain. At that time, I interpreted the relief of the neck pain as coming from the left C3, C4, C5, and C6 medial  branch radiofrequency neurotomies. However, because the greater occipital nerve comes from C2 and the third occipital nerve (C3 branch), there was the possibility that because we did not touch the C2 this might have accounted for her persistent headaches.  The patient was brought back on 08/31/2015 for a left-sided C7-T1 cervical epidural steroid injection under fluoroscopic guidance. The idea of this block was to determine if the etiology of her pain was in the mid compartment of the cervical spine (intraspinal) as opposed to coming from the facet joint pathology. Today, 10/02/2015, she returns for a pulsed procedure evaluation indicating that she did not get any relief of the pain for the duration of the local anesthetic (initial 4-6 hours) and despite the fact that the procedure was done with moderate sedation using midazolam and fentanyl, she also describes not getting any relief of the pain for the duration of the intravenous analgesics and sedatives. In addition, she indicates not getting any long-term relief from the steroids. This would suggest that we were not dealing with a condition that involves an inflammatory process. It also would suggest that it is more of a mechanical process and that it is likely to be coming from the area above that injected.  Based on the above 2 results, I will be bringing her back for a diagnostic, bilateral greater occipital nerve block. If she does get good relief of the duration of the local anesthetic, we will offer her the possibility of a radiofrequency of the C2 and TON on the affected side.  The patient also indicated that Dr. Faith Rogue has all for her to do some left-sided Cervical Botox injections. Today I have recommended for the patient to go ahead with that if she feels that it may provide her with some additional benefit.  Note: Monica [name] appointment  today was for 9:40 AM. She showed up to the appointment early and was checked in at 9:15 AM. The  nursing staff the patient back into the room and checked her in at 10:42 AM. Unfortunately today I had some delays with the patient stent where scheduled ahead of Monica [name]. I was unable to get to her until about 11:31 AM. Her discharge orders were put in at about 11:51 AM. It took 2 hours for [place] to get her through from the time of her appointment. We did express to her our sincere apologies for the delay. However, the patient did throw a fit in the clinic where she raised her voice as she moved into the hallway, halting normal flow the practice.  Reported Pain Score: 7 , clinically she looks like a 3-4/10. Reported level is inconsistent with clinical obrservations. Pain Type: Chronic pain Pain Location: Neck Pain Orientation: Left Pain Descriptors / Indicators: Aching Pain Frequency: Constant  Date of Last Visit: 08/31/15 Service Provided on Last Visit:  (Radiofrequency pain has come back on 09-13-15)  Controlled Substance Pharmacotherapy Assessment  Analgesic: Methadone 20 mg every 8 hours plus oral med Benay Spice 50 mg daily when necessary. This is being prescribed to the patient by her primary pain physician Dr. Faith Rogue. MME/day: 600 mg/day Pharmacokinetics: Onset of action (Liberation/Absorption): Within expected pharmacological parameters Time to Peak effect (Distribution): Timing and results are as within normal expected parameters Duration of action (Metabolism/Excretion): Within normal limits for medication Pharmacodynamics: Analgesic Effect: More than 50% Activity Facilitation: Medication(s) allow patient to sit, stand, walk, and do the basic ADLs Perceived Effectiveness: Described as relatively effective, allowing for increase in activities of daily living (ADL) Side-effects or Adverse reactions: None reported Monitoring: [place] PMP: Review was deferred due to the patient not receiving medications from this practice UDS Results/interpretation: The patient's last UDS was done  on 02/24/2015 and it appears to be within normal limits with no unexpected results. Medication Assessment Form: Deferred Treatment compliance: No determination given. Risk Assessment: Substance Use Disorder (SUD) Risk Level: Low Opioid Risk Tool (ORT) Score: Total Score: 0  Pharmacologic Plan: Continue therapy as is  Lab Work: Illicit Drugs Lab Results  Component Value Date   THCU NEG 06/26/2015   COCAINSCRNUR NEG 06/26/2015   MDMA NEG 06/26/2015   AMPHETMU NEG 06/26/2015    Inflammation Markers No results found for: ESRSEDRATE, CRP  Renal Function Lab Results  Component Value Date   BUN 16 07/31/2012   CREATININE 1.10 07/31/2012   GFRAA 62* 07/31/2012   GFRNONAA 54* 07/31/2012    Hepatic Function Lab Results  Component Value Date   AST 18 05/04/2012   ALT 22 05/04/2012   ALBUMIN 3.9 05/04/2012    Electrolytes Lab Results  Component Value Date   NA 137 07/31/2012   K 4.5 08/03/2012   CL 103 07/31/2012   CALCIUM 9.7 07/31/2012   Post-Procedure Assessment  Procedure done on last visit: Palliative, left sided, C7-T1 interlaminar laminar cervical epidural steroid injection under fluoroscopic guidance and IV sedation. Side-effects or Adverse reactions: None reported Sedation: Procedure was performed with sedation  Results: Ultra-Short Term Relief (First 1 hour after procedure): 0 %  The patient indicates not having any benefit from the local anesthetics injected into the area or the IV sedation provided. This is rather rare and could suggest that the patient does not understand what is being asked, or is attempting to convey the notion that nothing is providing permanent relief. In both instances, further  patient education is needed. Because we did administer intravenous midazolam, he would also suggest that the pain does not respond to the use of benzodiazepines. Short Term Relief (Initial 4-6 hrs after procedure): 0 % No short term relief of the pain during this  time would suggest that the local anesthetics did not provide any relief. Long Term Relief : 0 % No long-term benefit would suggest etiology to be non-inflammatory, possibly compressive   Current Relief (Now):  0%  No benefit are all suggest not only that the area injected was not involved in detail you have the pain, but it would also suggest that there is not an inflammatory component to this pain. Interpretation of Results: Because were injected and the C7-T1 level and she does have a prior history of a cervical spine surgery, this would suggest that the flow of the medication and did not reach the upper cervical nerve roots. This could be secondary to decrease diameter of the cervical canal, epidural fibrosis blocking the flow, or lack of understanding on the patient's part to the period time that we are requesting information from.  Allergies  Monica [name] is allergic to hydromorphone hcl; aspirin; capsaicin; depakote; milnacipran hcl; pristiq; and zanaflex.  Meds  The patient has a current medication list which includes the following prescription(s): glimepiride, lidocaine, meperidine, metaxalone, metformin, methadone, naproxen, promethazine, sumatriptan, and topiramate.  Current Outpatient Prescriptions on File Prior to Visit  Medication Sig  . glimepiride (AMARYL) 4 MG tablet Take 4 mg by mouth daily before breakfast.   . lidocaine (LIDODERM) 5 % APPLY 1 PATCH ONTO THE SKIN DAILY. REMOVE AND DISCARD WITHIN 12 HOURSOR AS DIRECTED--RIGHT TRAP/SHOULDER AREA.  Marland Kitchen meperidine (DEMEROL) 50 MG tablet Take 1-1.5 tablets (50-75 mg total) by mouth daily as needed for severe pain.  . metaxalone (SKELAXIN) 800 MG tablet TAKE (1) TABLET BY MOUTH FOUR TIMES DAILY AS NEEDED.  . metFORMIN (GLUCOPHAGE-XR) 500 MG 24 hr tablet Take 1,000 mg by mouth 2 (two) times daily.   . methadone (DOLOPHINE) 10 MG tablet Take 2 tablets (20 mg total) by mouth 3 (three) times daily.  . naproxen (NAPROSYN) 500 MG tablet TAKE  1 TABLET BY MOUTH TWICE DAILY WITH FOOD.  Marland Kitchen promethazine (PHENERGAN) 25 MG tablet Take 1 tablet (25 mg total) by mouth every 8 (eight) hours as needed for nausea or vomiting.  . SUMAtriptan (IMITREX) 100 MG tablet TAKE (1) TABLET BY MOUTH ONCE DAILY AS NEEDED.  Marland Kitchen topiramate (TOPAMAX) 50 MG tablet TAKE 2 TABLETS IN THE MORNING AND 4 TABLETS AT BEDTIME.   No current facility-administered medications on file prior to visit.    ROS  Constitutional: Afebrile, no chills, well hydrated and well nourished Gastrointestinal: negative Musculoskeletal:negative Neurological: negative Behavioral/Psych: negative  PFSH  Medical:  Monica [name]  has a past medical history of Chronic nausea; Hemorrhoids (11/17/08); Chronic headaches; PTSD (post-traumatic stress disorder); DM (diabetes mellitus) (HCC); Torticollis; Elevated liver enzymes; Headache(784.0); Migraine without aura, with intractable migraine, so stated, without mention of status migrainosus; Unspecified musculoskeletal disorders and symptoms referable to neck; Occipital neuralgia; Posttraumatic stress disorder; Torticollis; Cervicalgia; Complication of anesthesia; Renal lithiasis; Glaucoma; Seizure (HCC); Arthritis; and Cervical facet joint syndrome (04/22/2012). Family: family history includes Colon cancer (age of onset: 37) in her mother. Surgical:  has past surgical history that includes Colonoscopy (11/17/08); gastro empy study (03/03/10); Esophagogastroduodenoscopy (04/05/10); Cholecystectomy; Neck surgery (1998); Hand surgery; Breast lumpectomy; Craniotomy; Nephrolithotomy (05/13/2012); and Finger surgery. Tobacco:  reports that she has never smoked. She has never  used smokeless tobacco. Alcohol:  reports that she does not drink alcohol. Drug:  reports that she does not use illicit drugs.  Physical Exam  Vitals:  Today's Vitals   10/02/15 1042 10/02/15 1043  BP: 137/66   Pulse: 87   Temp: 99.4 F (37.4 C)   Resp: 16   Height:  (1.626 m)    Weight: 148 lb (67.132 kg)   SpO2: 97%   PainSc: 7  7   PainLoc: Neck     Calculated BMI: Body mass index is 25.39 kg/(m^2).  General appearance: alert, cooperative, appears stated age, mild distress and frustrated/aggravated Eyes: PERLA Respiratory: No evidence respiratory distress, no audible rales or ronchi and no use of accessory muscles of respiration  Cervical Spine Inspection: Evidence of an ACDF observed. Alignment: Symetrical ROM: Decreased and painful Palpation: Tenderness over the area of the occipital nerve with triggering of the headache upon applying pressure.  Upper Extremities Inspection: No gross anomalies detected ROM: Adequate Sensory: Normal Motor: Unremarkable  Thoracic Spine Inspection: No gross anomalies detected Alignment: Symetrical ROM: Adequate  Lumbar Spine Inspection: No gross anomalies detected Alignment: Symetrical ROM: Adequate  Gait: WNL  Lower Extremities Inspection: No gross anomalies detected ROM: Adequate Sensory:  Normal Motor: Unremarkable  Assessment & Plan  Primary Diagnosis & Pertinent Problem List: The primary encounter diagnosis was Chronic pain. Diagnoses of Cervicogenic headache (Location of Primary Source of Pain) (L>R), Encounter for therapeutic drug level monitoring, Cervical facet arthropathy (severe at C3-4) (Bilateral) (L>R), Cervical facet syndrome (Bilateral) (L>R), Bilateral occipital neuralgia, Opiate use (600 MME/Day), and Pain management were also pertinent to this visit.  Visit Diagnosis: 1. Chronic pain   2. Cervicogenic headache (Location of Primary Source of Pain) (L>R)   3. Encounter for therapeutic drug level monitoring   4. Cervical facet arthropathy (severe at C3-4) (Bilateral) (L>R)   5. Cervical facet syndrome (Bilateral) (L>R)   6. Bilateral occipital neuralgia   7. Opiate use (600 MME/Day)   8. Pain management     Problem-specific Plan(s): Cervical facet syndrome (Location of Secondary  source of pain) (Bilateral) (L>R) On 06/15/2015 the patient came in to our service as a "FastTrak" for a left-sided C3, C4, C5, and C6 medial branch radiofrequency ablation. The patient was seen on follow-up on 07/26/2015 and she indicated having attained 0% relief of the duration of local anesthetic, 0% long-term relief of the cervicogenic headaches, but with 100% relief of the neck pain. At that time, I interpreted the relief of the neck pain as coming from the left C3, C4, C5, and C6 medial branch radiofrequency neurotomies. However, because the greater occipital nerve comes from C2 and the third occipital nerve (C3 branch), there was the possibility that because we did not touch the C2 this might have accounted for her persistent headaches. [facility])  Cervicogenic headache (Location of Primary Source of Pain) (Bilateral) (L>R) The patient was brought back on 08/31/2015 for a left-sided C7-T1 cervical epidural steroid injection under fluoroscopic guidance. The idea of this block was to determine if the etiology of her pain was in the mid compartment of the cervical spine (intraspinal) as opposed to coming from the facet joint pathology. Today, 10/02/2015, she returns for a pulsed procedure evaluation indicating that she did not get any relief of the pain for the duration of the local anesthetic (initial 4-6 hours) and despite the fact that the procedure was done with moderate sedation using midazolam and fentanyl, she also describes not  getting any relief of the pain for the duration of the intravenous analgesics and sedatives. In addition, she indicates not getting any long-term relief from the steroids. This would suggest that we were not dealing with a condition that involves an inflammatory process. It also would suggest that it is more of a mechanical process and that it is likely to be coming from the area above that injected. [facility])  Opiate use (600 MME/Day) Analgesic: Methadone 20 mg every 8 hours plus oral med Benay Spice 50 mg daily when necessary. This is being prescribed to the patient by her primary pain physician Dr. Faith Rogue. MME/day: 600 mg/day  Occipital neuralgia (Location of Primary Source of Pain) (Bilateral) (L>R) We will initially bring the patient back for a diagnostic bilateral occipital nerve block. Should the patient get good relief, but no long-term benefit (as expected), then we may be able to offer her radiofrequency ablation of the C2 medial branch and perhaps TON (Third Occipital Nerve).    Plan of Care  Pharmacotherapy (Medications Ordered): No orders of the defined types were placed in this encounter.    Lab-work & Procedure Ordered: Orders Placed This Encounter  Procedures  . GREATER OCCIPITAL NERVE BLOCK    Standing Status: Future     Number of Occurrences:      Standing Expiration Date: 10/01/2016    Scheduling Instructions:     Side: Bilateral     Sedation: Without Sedation.     Timeframe: ASAA    Order Specific Question:  Where will this procedure be performed?    Answer:  [facility]    Imaging Ordered: None  Interventional Therapies: Scheduled: Bilateral, diagnostic, greater occipital nerve block under fluoroscopic guidance, no sedation. PRN Procedures: Bilateral, diagnostic, cervical facet block under fluoroscopic guidance and IV sedation.    Referral(s) or Consult(s): None at this time.  Medications administered during this visit: Monica [name] had no medications administered during this visit.  Future Appointments Date Time Provider Department Center  10/18/2015 11:20 AM Ranelle Oyster, MD [facility]    Primary Care Physician: Kirstie Peri, MD Location: [facility] Note by: Sydnee Levans. Laban Emperor, M.D, DABA, DABAPM, DABPM, DABIPP, FIPP

## 2015-10-02 NOTE — Progress Notes (Signed)
Safety precautions to be maintained throughout the outpatient stay will include: orient to surroundings, keep bed in low position, maintain call bell within reach at all times, provide assistance with transfer out of bed and ambulation.  

## 2015-10-06 DIAGNOSIS — R52 Pain, unspecified: Secondary | ICD-10-CM | POA: Insufficient documentation

## 2015-10-06 DIAGNOSIS — Z452 Encounter for adjustment and management of vascular access device: Secondary | ICD-10-CM | POA: Insufficient documentation

## 2015-10-06 NOTE — Assessment & Plan Note (Addendum)
On 06/15/2015 the patient came in to our service as a "FastTrak" for a left-sided C3, C4, C5, and C6 medial branch radiofrequency ablation. The patient was seen on follow-up on 07/26/2015 and she indicated having attained 0% relief of the duration of local anesthetic, 0% long-term relief of the cervicogenic headaches, but with 100% relief of the neck pain. At that time, I interpreted the relief of the neck pain as coming from the left C3, C4, C5, and C6 medial branch radiofrequency neurotomies. However, because the greater occipital nerve comes from C2 and the third occipital nerve (C3 branch), there was the possibility that because we did not touch the C2 this might have accounted for her persistent headaches. [facility])

## 2015-10-06 NOTE — Assessment & Plan Note (Signed)
Analgesic: Methadone 20 mg every 8 hours plus oral med Benay Spice 50 mg daily when necessary. This is being prescribed to the patient by her primary pain physician Dr. Faith Rogue. MME/day: 600 mg/day

## 2015-10-06 NOTE — Assessment & Plan Note (Signed)
The patient was brought back on 08/31/2015 for a left-sided C7-T1 cervical epidural steroid injection under fluoroscopic guidance. The idea of this block was to determine if the etiology of her pain was in the mid compartment of the cervical spine (intraspinal) as opposed to coming from the facet joint pathology. Today, 10/02/2015, she returns for a pulsed procedure evaluation indicating that she did not get any relief of the pain for the duration of the local anesthetic (initial 4-6 hours) and despite the fact that the procedure was done with moderate sedation using midazolam and fentanyl, she also describes not getting any relief of the pain for the duration of the intravenous analgesics and sedatives. In addition, she indicates not getting any long-term relief from the steroids. This would suggest that we were not dealing with a condition that involves an inflammatory process. It also would suggest that it is more of a mechanical process and that it is likely to be coming from the area above that injected. [facility])

## 2015-10-06 NOTE — Assessment & Plan Note (Signed)
We will initially bring the patient back for a diagnostic bilateral occipital nerve block. Should the patient get good relief, but no long-term benefit (as expected), then we may be able to offer her radiofrequency ablation of the C2 medial branch and perhaps TON (Third Occipital Nerve).

## 2015-10-10 ENCOUNTER — Other Ambulatory Visit: Payer: Self-pay | Admitting: Physical Medicine & Rehabilitation

## 2015-10-18 ENCOUNTER — Encounter: Payer: Self-pay | Admitting: Physical Medicine & Rehabilitation

## 2015-10-18 ENCOUNTER — Encounter
Payer: Worker's Compensation | Attending: Physical Medicine & Rehabilitation | Admitting: Physical Medicine & Rehabilitation

## 2015-10-18 VITALS — BP 167/89 | HR 59

## 2015-10-18 DIAGNOSIS — M47812 Spondylosis without myelopathy or radiculopathy, cervical region: Secondary | ICD-10-CM

## 2015-10-18 DIAGNOSIS — F4310 Post-traumatic stress disorder, unspecified: Secondary | ICD-10-CM | POA: Insufficient documentation

## 2015-10-18 DIAGNOSIS — G43919 Migraine, unspecified, intractable, without status migrainosus: Secondary | ICD-10-CM | POA: Insufficient documentation

## 2015-10-18 DIAGNOSIS — M129 Arthropathy, unspecified: Secondary | ICD-10-CM | POA: Diagnosis not present

## 2015-10-18 DIAGNOSIS — G8929 Other chronic pain: Secondary | ICD-10-CM | POA: Diagnosis not present

## 2015-10-18 DIAGNOSIS — Z79899 Other long term (current) drug therapy: Secondary | ICD-10-CM | POA: Diagnosis not present

## 2015-10-18 DIAGNOSIS — M542 Cervicalgia: Secondary | ICD-10-CM | POA: Insufficient documentation

## 2015-10-18 DIAGNOSIS — Z5181 Encounter for therapeutic drug level monitoring: Secondary | ICD-10-CM

## 2015-10-18 DIAGNOSIS — M5382 Other specified dorsopathies, cervical region: Secondary | ICD-10-CM | POA: Diagnosis not present

## 2015-10-18 DIAGNOSIS — F603 Borderline personality disorder: Secondary | ICD-10-CM | POA: Insufficient documentation

## 2015-10-18 DIAGNOSIS — G894 Chronic pain syndrome: Secondary | ICD-10-CM

## 2015-10-18 DIAGNOSIS — M7918 Myalgia, other site: Secondary | ICD-10-CM

## 2015-10-18 DIAGNOSIS — G43711 Chronic migraine without aura, intractable, with status migrainosus: Secondary | ICD-10-CM

## 2015-10-18 DIAGNOSIS — M797 Fibromyalgia: Secondary | ICD-10-CM

## 2015-10-18 DIAGNOSIS — M791 Myalgia: Secondary | ICD-10-CM | POA: Diagnosis not present

## 2015-10-18 MED ORDER — MEPERIDINE HCL 50 MG PO TABS: 50.0000 mg | ORAL_TABLET | Freq: Every day | ORAL | Status: DC | PRN

## 2015-10-18 MED ORDER — METHADONE HCL 10 MG PO TABS: 20.0000 mg | ORAL_TABLET | Freq: Three times a day (TID) | ORAL | Status: DC

## 2015-10-18 MED ORDER — METAXALONE 800 MG PO TABS: ORAL_TABLET | ORAL | Status: DC

## 2015-10-18 NOTE — Progress Notes (Signed)
Subjective:    Patient ID: Monica [name], female    DOB: 1951/03/27, 65 y.o.   MRN: [id number]  HPI   Monica [name] is here in follow up of her chronic pain and headaches. She goes back to Dr. Laban Emperor for a follow up nerve block. The original block may have exacerbated her headaches. The effects of her RF's apparently just "stopped" on February 1st and the cervical symptoms recurred. Her symptoms tend to wax and wane.   She maintains her medications as prescribed.   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       Pain Inventory Average Pain 6 Pain Right Now 6 My pain is constant, sharp, burning and tingling  In the last 24 hours, has pain interfered with the following? General activity 8 Relation with others 8 Enjoyment of life 8 What TIME of day is your pain at its worst? daytime Sleep (in general) Poor  Pain is worse with: bending and some activites Pain improves with: medication Relief from Meds: na  Mobility Do you have any goals in this area?  no  Function Do you have any goals in this area?  no  Neuro/Psych No problems in this area  Prior Studies Any changes since last visit?  no  Physicians involved in your care Any changes since last visit?  no   Family History  Problem Relation Age of Onset  . Colon cancer Mother 9   Social History   Social History  . Marital Status: Married    Spouse Name: N/A  . Number of Children: 3  . Years of Education: N/A   Occupational History  . disabled    Social History Main Topics  . Smoking status: Never Smoker   . Smokeless tobacco: Never Used  . Alcohol Use: No  . Drug Use: No  . Sexual Activity: Yes    Birth Control/ Protection: Post-menopausal   Other Topics Concern  . None   Social History Narrative   Past Surgical History  Procedure Laterality Date  . Colonoscopy  11/17/08    ext hemorrhoids  . Gastro empy study  03/03/10    normal  . Esophagogastroduodenoscopy  04/05/10    [name] (40F),erosive reflux esophagitis, small hiatal hernia,  . Cholecystectomy    . Neck surgery  1998  . Hand surgery    . Breast lumpectomy      left  . Craniotomy      secondary TBI  . Nephrolithotomy  05/13/2012    Procedure: NEPHROLITHOTOMY PERCUTANEOUS;  Surgeon: Marcine Matar, MD;  Location: [facility];  Service: Urology;  Laterality: Left;     . Finger surgery      removal cyst left pinky   Past Medical History  Diagnosis Date  . Chronic nausea     normal GES   . Hemorrhoids 11/17/08    Colonoscopy Dr Karilyn Cota  . Chronic headaches     Dr Hermelinda Medicus [place] [facility]  . PTSD (post-traumatic stress disorder)   . DM (diabetes mellitus) (HCC)   . Torticollis   . Elevated liver enzymes     hx of, normal 03/2010  . Headache(784.0)   . Migraine without aura, with intractable migraine, so stated, without mention of status migrainosus   . Unspecified musculoskeletal disorders and symptoms referable to neck     cervical/trapezius  . Occipital neuralgia   . Posttraumatic stress disorder   . Torticollis   . Cervicalgia   . Complication of anesthesia  had problem with local anesthesia-tried to assist Physician  . Renal lithiasis   . Glaucoma   . Seizure (HCC)     12 yrs ago-med related  . Arthritis   . Cervical facet joint syndrome 04/22/2012   BP 167/89 mmHg  Pulse 59  SpO2 99%  Opioid Risk Score:   Fall Risk Score:  `1  Depression screen PHQ 2/9  [facility] 10/18/2015 10/02/2015 08/31/2015 08/23/2015 07/26/2015 06/13/2015 02/24/2015  Decreased Interest 0 0 0 1 0 0 2  Down, Depressed, Hopeless 0 0 0 1 0 0 1  PHQ - 2 Score 0 0 0 2 0 0 3  Altered sleeping - - - 2 - - 1  Tired, decreased energy - - - 1 - - 1  Change in appetite - - - 1 - - 3  Feeling bad or failure about yourself  - - - 1 - - 1  Trouble concentrating - - - 1 - - 2  Moving slowly or fidgety/restless - - - 1 - - 0  Suicidal thoughts - - - 0 - - 0  PHQ-9 Score - - - 9 - - 11       Review of Systems  All other systems reviewed and are negative.      Objective:   Physical Exam  normal motor tone is noted in both LE's . DTR's appear to be 1+ to 2+ throughout. Muscle testing reveals 5/5 muscle strength of the upper extremity, and 5/5 of the lower extremity. Full range of motion in upper and lower extremities. Cervical ROM is more limited today. Only mild tenderness with Palpation in the cervical region today  Neuro: Fine motor movements are normal in both hands.  Sensory is intact and symmetric to light touch, pinprick and proprioception.  DTR in the upper and lower extremity are present and symmetric 2+. No clonus is noted.  Patient arises from chair without difficulty. Tandem walk is stable.  M/S: pain with palpation over the left iliac crest. Neck rom is improved  Psych: in good spirits today. A little more subdued.   Assessment & Plan:   Assessment:  1. 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  2. Posttraumatic stress syndrome.  3. Borderline Personality--hx of       PLAN:  1. Continue methadone rx. Continue demerol 50 mg qd prn for severe pain. Second rx's were given for next month  2. Cervical blocks per Dr. Laban EmperorNaveira. Consider botox later this year too.  3. Continue topamax at full dose. She is willing to accept the potential risk for stones.  4. Maintain cervical range of motion/posture exercises. Discussed the importance of holistic management of her pain as well.  5. Continue k-tape, aromatherapy, and cervical traction also.  6. 15 minutes of face to face patient care time were spent during this visit. All questions were encouraged and answered. I'll see her back in about 2 months.

## 2015-10-18 NOTE — Patient Instructions (Signed)
  PLEASE CALL ME WITH ANY PROBLEMS OR QUESTIONS (#[phone]).      

## 2015-10-19 ENCOUNTER — Ambulatory Visit: Payer: Worker's Compensation | Attending: Pain Medicine | Admitting: Pain Medicine

## 2015-10-19 ENCOUNTER — Encounter: Payer: Self-pay | Admitting: Pain Medicine

## 2015-10-19 VITALS — BP 155/60 | HR 64 | Temp 98.1°F | Resp 16 | Ht 64.0 in | Wt 147.0 lb

## 2015-10-19 DIAGNOSIS — M4802 Spinal stenosis, cervical region: Secondary | ICD-10-CM | POA: Diagnosis not present

## 2015-10-19 DIAGNOSIS — G9619 Other disorders of meninges, not elsewhere classified: Secondary | ICD-10-CM | POA: Diagnosis not present

## 2015-10-19 DIAGNOSIS — F1120 Opioid dependence, uncomplicated: Secondary | ICD-10-CM | POA: Insufficient documentation

## 2015-10-19 DIAGNOSIS — Z79891 Long term (current) use of opiate analgesic: Secondary | ICD-10-CM | POA: Insufficient documentation

## 2015-10-19 DIAGNOSIS — S7002XA Contusion of left hip, initial encounter: Secondary | ICD-10-CM | POA: Insufficient documentation

## 2015-10-19 DIAGNOSIS — F603 Borderline personality disorder: Secondary | ICD-10-CM | POA: Insufficient documentation

## 2015-10-19 DIAGNOSIS — M791 Myalgia: Secondary | ICD-10-CM | POA: Insufficient documentation

## 2015-10-19 DIAGNOSIS — M5481 Occipital neuralgia: Secondary | ICD-10-CM | POA: Insufficient documentation

## 2015-10-19 DIAGNOSIS — M47812 Spondylosis without myelopathy or radiculopathy, cervical region: Secondary | ICD-10-CM | POA: Insufficient documentation

## 2015-10-19 DIAGNOSIS — Z9889 Other specified postprocedural states: Secondary | ICD-10-CM | POA: Diagnosis not present

## 2015-10-19 DIAGNOSIS — R1084 Generalized abdominal pain: Secondary | ICD-10-CM | POA: Diagnosis not present

## 2015-10-19 DIAGNOSIS — X58XXXA Exposure to other specified factors, initial encounter: Secondary | ICD-10-CM | POA: Diagnosis not present

## 2015-10-19 DIAGNOSIS — M4690 Unspecified inflammatory spondylopathy, site unspecified: Secondary | ICD-10-CM | POA: Insufficient documentation

## 2015-10-19 DIAGNOSIS — M542 Cervicalgia: Secondary | ICD-10-CM | POA: Diagnosis not present

## 2015-10-19 DIAGNOSIS — F329 Major depressive disorder, single episode, unspecified: Secondary | ICD-10-CM | POA: Insufficient documentation

## 2015-10-19 DIAGNOSIS — K219 Gastro-esophageal reflux disease without esophagitis: Secondary | ICD-10-CM | POA: Insufficient documentation

## 2015-10-19 DIAGNOSIS — G43909 Migraine, unspecified, not intractable, without status migrainosus: Secondary | ICD-10-CM | POA: Insufficient documentation

## 2015-10-19 DIAGNOSIS — G4486 Cervicogenic headache: Secondary | ICD-10-CM

## 2015-10-19 DIAGNOSIS — G8929 Other chronic pain: Secondary | ICD-10-CM | POA: Insufficient documentation

## 2015-10-19 DIAGNOSIS — R51 Headache: Secondary | ICD-10-CM | POA: Diagnosis not present

## 2015-10-19 MED ORDER — LIDOCAINE HCL (PF) 1 % IJ SOLN
INTRAMUSCULAR | Status: DC
Start: 2015-10-19 — End: 2015-10-19
  Filled 2015-10-19: qty 5

## 2015-10-19 MED ORDER — FENTANYL CITRATE (PF) 100 MCG/2ML IJ SOLN: 100.0000 ug | INTRAMUSCULAR | Status: DC

## 2015-10-19 MED ORDER — MIDAZOLAM HCL 5 MG/5ML IJ SOLN: 5.0000 mg | INTRAMUSCULAR | Status: DC

## 2015-10-19 MED ORDER — LACTATED RINGERS IV SOLN: 1000.0000 mL | INTRAVENOUS | Status: DC

## 2015-10-19 MED ORDER — DEXAMETHASONE SODIUM PHOSPHATE 10 MG/ML IJ SOLN: 10.0000 mg | Freq: Once | INTRAMUSCULAR | Status: DC

## 2015-10-19 MED ORDER — DEXAMETHASONE SODIUM PHOSPHATE 10 MG/ML IJ SOLN
INTRAMUSCULAR | Status: AC
  Administered 2015-10-19: 08:00:00
  Filled 2015-10-19: qty 1

## 2015-10-19 MED ORDER — LIDOCAINE HCL (PF) 1 % IJ SOLN
INTRAMUSCULAR | Status: AC
Start: 2015-10-19 — End: 2015-10-19
  Administered 2015-10-19: 08:00:00
  Filled 2015-10-19: qty 5

## 2015-10-19 MED ORDER — ROPIVACAINE HCL 2 MG/ML IJ SOLN: 9.0000 mL | Freq: Once | INTRAMUSCULAR | Status: DC

## 2015-10-19 MED ORDER — ROPIVACAINE HCL 2 MG/ML IJ SOLN
INTRAMUSCULAR | Status: AC
Start: 2015-10-19 — End: 2015-10-19
  Administered 2015-10-19: 08:00:00
  Filled 2015-10-19: qty 10

## 2015-10-19 NOTE — Patient Instructions (Signed)
Occipital Nerve Block Patient Information  Description: The occipital nerves originate in the cervical (neck) spinal cord and travel upward through muscle and tissue to supply sensation to the back of the head and top of the scalp.  In addition, the nerves control some of the muscles of the scalp.  Occipital neuralgia is an irritation of these nerves which can cause headaches, numbness of the scalp, and neck discomfort.     The occipital nerve block will interrupt nerve transmission through these nerves and can relieve pain and spasm.  The block consists of insertion of a small needle under the skin in the back of the head to deposit local anesthetic (numbing medicine) and/or steroids around the nerve.  The entire block usually lasts less than 5 minutes.  Conditions which may be treated by occipital blocks:   Muscular pain and spasm of the scalp  Nerve irritation, back of the head  Headaches  Upper neck pain  Preparation for the injection:  1. Do not eat any solid food or dairy products within 8 hours of your appointment. 2. You may drink clear liquids up to 3 hours before appointment.  Clear liquids include water, black coffee, juice or soda.  No milk or cream please. 3. You may take your regular medication, including pain medications, with a sip of water before you appointment.  Diabetics should hold regular insulin (if taken separately) and take 1/2 normal NPH dose the morning of the procedure.  Carry some sugar containing items with you to your appointment. 4. A driver must accompany you and be prepared to drive you home after your procedure. 5. Bring all your current medications with you. 6. An IV may be inserted and sedation may be given at the discretion of the physician. 7. A blood pressure cuff, EKG, and other monitors will often be applied during the procedure.  Some patients may need to have extra oxygen administered for a short period. 8. You will be asked to provide medical  information, including your allergies and medications, prior to the procedure.  We must know immediately if you are taking blood thinners (like Coumadin/Warfarin) or if you are allergic to IV iodine contrast (dye).  We must know if you could possible be pregnant.  9. Do not wear a high collared shirt or turtleneck.  Tie long hair up in the back if possible.  Possible side-effects:   Bleeding from needle site  Infection (rare, may require surgery)  Nerve injury (rare)  Hair on back of neck can be tinged with iodine scrub (this will wash out)  Light-headedness (temporary)  Pain at injection site (several days)  Decreased blood pressure (rare, temporary)  Seizure (very rare)  Call if you experience:   Hives or difficulty breathing ( go to the emergency room)  Inflammation or drainage at the injection site(s)  Please note:  Although the local anesthetic injected can often make your painful muscles or headache feel good for several hours after the injection, the pain may return.  It takes 3-7 days for steroids to work.  You may not notice any pain relief for at least one week.  If effective, we will often do a series of injections spaced 3-6 weeks apart to maximally decrease your pain.  If you have any questions, please call [phone] [facility] Pain Management Discharge Instructions  General Discharge Instructions :  If you need to reach your doctor call: Monday-Friday 8:00 am - 4:00 pm at [phone] or   toll free [phone].  After clinic hours [phone] to have operator reach doctor.  Bring all of your medication bottles to all your appointments in the pain clinic.  To cancel or reschedule your appointment with Pain Management please remember to call 24 hours in advance to avoid a fee.  Refer to the educational materials which you have been given on: General Risks, I had my Procedure. Discharge Instructions, Post  Sedation.  Post Procedure Instructions:  The drugs you were given will stay in your system until tomorrow, so for the next 24 hours you should not drive, make any legal decisions or drink any alcoholic beverages.  You may eat anything you prefer, but it is better to start with liquids then soups and crackers, and gradually work up to solid foods.  Please notify your doctor immediately if you have any unusual bleeding, trouble breathing or pain that is not related to your normal pain.  Depending on the type of procedure that was done, some parts of your body may feel week and/or numb.  This usually clears up by tonight or the next day.  Walk with the use of an assistive device or accompanied by an adult for the 24 hours.  You may use ice on the affected area for the first 24 hours.  Put ice in a Ziploc bag and cover with a towel and place against area 15 minutes on 15 minutes off.  You may switch to heat after 24 hours. 

## 2015-10-19 NOTE — Progress Notes (Signed)
Safety precautions to be maintained throughout the outpatient stay will include: orient to surroundings, keep bed in low position, maintain call bell within reach at all times, provide assistance with transfer out of bed and ambulation.  

## 2015-10-19 NOTE — Progress Notes (Signed)
Patient's Name: Monica [name] MRN: [id number] DOB: 1951-05-02 DOS: 10/19/2015  Primary Reason(s) for Visit: Interventional Pain Management Treatment. CC: Headache   Pre-Procedure Assessment:  Monica [name] is a 65 y.o. year old, female patient, seen today for interventional treatment. She has Generalized abdominal pain; GERD (gastroesophageal reflux disease); Headache, migraine, intractable; Depression; Borderline personality disorder; Snoring; Dyspnea; Myofascial muscle pain; Cervical spondylosis; Cervical facet syndrome (Location of Secondary source of pain) (Bilateral) (L>R); Chronic pain; Cervicogenic headache (Location of Primary Source of Pain) (Bilateral) (L>R); Chronic neck pain (Location of Secondary source of pain) (Bilateral) (L>R); Long term current use of opiate analgesic; Long term prescription opiate use; Opiate use (600 MME/Day); Opiate dependence ([place]); Long-term current use of methadone for opiate dependence ([place]); 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 Her primarily concern today is the Headache   Today's Initial Pain Score: 4/10 Reported level of pain is incompatible with clinical obrservations. This may be secondary to a possible lack of understanding on how the pain scale works. Pain Type:  (after gettomg up and dressing- remains pain free) Pain Location: Head Pain Descriptors / Indicators: Aching Pain Frequency: Constant  Post-procedure Pain Score: 0-No pain  Date of Last Visit: 10/02/15 Service Provided on Last Visit: Evaluation  Verification of the correct person, correct site (including marking of  site), and correct procedure were performed and confirmed by the patient.  Today's Vitals   10/19/15 0840 10/19/15 0845 10/19/15 0850 10/19/15 0854  BP: 151/90 175/91 155/60   Pulse: 70 68 64   Temp:      Resp: _0 Height:      Weight:      SpO2: 98% 98% 99%   PainSc: 1  1  0-No pain 0-No pain  Calculated BMI: Body mass index is 25.22 kg/(m^2). Allergies: She is allergic to hydromorphone hcl; aspirin; capsaicin; depakote; milnacipran hcl; pristiq; and zanaflex.. Primary Diagnosis: Bilateral occipital neuralgia [M54.81]  Procedure:  Type: Diagnostic, Greater, Occipital Nerve Block (w/o sedation) Region: Posterolateral Cervical Level: Occipital Ridge   Laterality: Bilateral   Indications: 1. Bilateral occipital neuralgia   2. 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  Consent: Secured. Under the influence of no sedatives a written informed consent was obtained, after having provided information on the risks and possible complications. To fulfill our ethical and legal obligations, as recommended by the [name], we have provided information to the patient about our clinical impression; the nature and purpose of the treatment or procedure; the risks, benefits,  and possible complications of the intervention; alternatives; the risk(s) and benefit(s) of the alternative treatment(s) or procedure(s); and the risk(s) and benefit(s)  of doing nothing. The patient was provided information about the risks and possible complications associated with the procedure. These include, but are not limited to, failure to achieve desired goals, infection, bleeding, organ or nerve damage, allergic reactions, paralysis, and death. In the case of spinal procedures these may include, but are not limited to, failure to achieve desired goals, infection, bleeding, organ or nerve damage, allergic reactions, paralysis, and death. In addition, the patient was informed that Medicine is not an exact science; therefore, there is also the possibility of unforeseen risks and possible complications that may result in a catastrophic outcome. The patient indicated having understood very clearly. We have given the patient no guarantees and we have made no promises. Enough time was given to the patient to ask questions, all of which were answered to the patient's satisfaction.  Pre-Procedure Preparation: Safety Precautions: Allergies reviewed. Appropriate site, procedure, and patient were confirmed by following the Joint Commission's Universal Protocol (UP.01.01.01), in the form of a "Time Out". The patient was asked to confirm marked site and procedure, before commencing. The patient was asked about blood thinners, or active infections, both of which were denied. Patient was assessed for positional comfort and all pressure points were checked before starting procedure. Monitoring:  As per clinic protocol. Infection Control Precautions: Sterile technique used. Standard Universal Precautions were taken as recommended by the Department of [facility] for Disease Control and Prevention (CDC). Standard pre-surgical skin prep was conducted. Respiratory hygiene and cough etiquette was practiced. Hand  hygiene observed. Safe injection practices and needle disposal techniques followed. SDV (single dose vial) medications used. Medications properly checked for expiration dates and contaminants. Personal protective equipment (PPE) used: Sterile surgical gloves.  Anesthesia, Analgesia, Anxiolysis:  Type: Local Anesthesia Local Anesthetic: Lidocaine 1% Route: Infiltration ([place]/IM) IV Access: Declined Sedation: Declined  Indication(s): Analgesia    Description of Procedure Process:  Time-out: "Time-out" completed before starting procedure, as per protocol. Position: Prone Target Area: Area medial to the occipital artery at the level of the superior nuchal ridge Approach: Posterior approach. Area Prepped: Entire Posterior Occipital Region Prepping solution: ChloraPrep (2% chlorhexidine gluconate and 70% isopropyl alcohol) Safety Precautions: Aspiration looking for blood return was conducted prior to all injections. At no point did we inject any substances, as a needle was being advanced. No attempts were made at seeking any paresthesias. Safe injection practices and needle disposal techniques used. Medications properly checked for expiration dates. SDV (single dose vial) medications used.   Description of the Procedure: Protocol guidelines were followed. The target area was identified and the area prepped in the usual manner. Skin & deeper tissues infiltrated with local anesthetic. Appropriate amount of time allowed to pass for local anesthetics to take effect. The procedure needles were then advanced to the target area. Proper needle placement secured. Negative aspiration confirmed. Solution injected in intermittent fashion, asking for systemic symptoms every 0.5cc of injectate. The needles were then removed and the area cleansed, making sure to leave some of the prepping solution back to take advantage of its long term bactericidal properties.  EBL: None Materials & Medications Used:  Needle(s)  Used: 22g - 1.5" Needle(s) Solution Injected: 0.2% PF-Ropivacaine (43m) + SDV-Decadron 10 mg/ml (122m (5 mL/side) Medication(s): Please see chart orders for medication and dosing details.  Imaging Guidance:  Type of Imaging Technique: Fluoroscopy Guidance Indication(s): Assistance in needle guidance and placement for procedures requiring needle placement in or near specific anatomical locations not easily accessible without such assistance. Exposure Time: Please  see nurses notes. Fluoroscopic Guidance: I was personally present in the fluoroscopy suite, where the patient was placed in position for the procedure, over the fluoroscopy-compatible table. Fluoroscopy was manipulated to obtain the best possible view of the target area, on the affected side. Permanently recorded images stored by scanning into EMR. Interpretation: Intraoperative imaging interpretation by performing Physician. Adequate needle placement confirmed. Needle placement confirmed in AP, lateral, & Oblique Views. Permanent hardcopy images in multiple planes scanned into the patient's record.  Antibiotic Prophylaxis:  Indication(s): No indications identified. Type:  Antibiotics Given (last 72 hours)    None       Post-operative Assessment:  Complications: No immediate post-treatment complications were observed. Disposition: The patient was discharged home, once institutional criteria were met. Return to clinic in 2 weeks for follow-up evaluation and interpretation of results. The patient tolerated the entire procedure well. A repeat set of vitals were taken after the procedure and the patient was kept under observation following institutional policy, for this type of procedure. Post-procedural neurological assessment was performed, showing return to baseline, prior to discharge. The patient was provided with post-procedure discharge instructions, including a section on how to identify potential problems. Should any problems arise  concerning this procedure, the patient was given instructions to immediately contact us, at any time, without hesitation. In any case, we plan to contact the patient by telephone for a follow-up status report regarding this interventional procedure. Comments:  No additional relevant information.  Medications administered during this visit: We administered ropivacaine (PF) 2 mg/ml (0.2%), dexamethasone, and lidocaine (PF).  Future Appointments Date Time Provider [place]  11/06/2015 7:40 AM Milinda Pointer, MD [facility] None  12/18/2015 11:40 AM Meredith Staggers, MD [facility] CPR    Primary Care Physician: Monico Blitz, MD Location: [facility] Facility Note by: Kathlen Brunswick. Dossie Arbour, M.D, DABA, DABAPM, DABPM, DABIPP, FIPP  Disclaimer:  Medicine is not an exact science. The only guarantee in medicine is that nothing is guaranteed. It is important to note that the decision to proceed with this intervention was based on the information collected from the patient. The Data and conclusions were drawn from the patient's questionnaire, the interview, and the physical examination. Because the information was provided in large part by the patient, it cannot be guaranteed that it has not been purposely or unconsciously manipulated. Every effort has been made to obtain as much relevant data as possible for this evaluation. It is important to note that the conclusions that lead to this procedure are derived in large part from the available data. Always take into account that the treatment will also be dependent on availability of resources and existing treatment guidelines, considered by other Pain Management Practitioners as being common knowledge and practice, at the time of the intervention. For Medico-Legal purposes, it is also important to point out that variation in procedural techniques and pharmacological choices are the acceptable norm. The indications, contraindications,  technique, and results of the above procedure should only be interpreted and judged by a Board-Certified Interventional Pain Specialist with extensive familiarity and expertise in the same exact procedure and technique. Attempts at providing opinions without similar or greater experience and expertise than that of the treating physician will be considered as inappropriate and unethical, and shall result in a formal complaint to the state medical board and applicable specialty societies.

## 2015-10-20 ENCOUNTER — Telehealth: Payer: Self-pay

## 2015-10-20 NOTE — Telephone Encounter (Signed)
Post procedure phone call.  Left message.  

## 2015-10-24 LAB — TOXASSURE SELECT,+ANTIDEPR,UR: PDF: 0

## 2015-10-24 NOTE — Progress Notes (Signed)
Urine drug screen for this encounter is consistent for prescribed medication 

## 2015-11-06 ENCOUNTER — Encounter: Payer: Self-pay | Admitting: Pain Medicine

## 2015-11-06 ENCOUNTER — Ambulatory Visit: Payer: Medicare Other | Attending: Pain Medicine | Admitting: Pain Medicine

## 2015-11-06 VITALS — BP 106/90 | HR 56 | Temp 98.6°F | Resp 16 | Ht 64.0 in | Wt 148.0 lb

## 2015-11-06 DIAGNOSIS — R0683 Snoring: Secondary | ICD-10-CM | POA: Diagnosis not present

## 2015-11-06 DIAGNOSIS — F603 Borderline personality disorder: Secondary | ICD-10-CM | POA: Insufficient documentation

## 2015-11-06 DIAGNOSIS — M47812 Spondylosis without myelopathy or radiculopathy, cervical region: Secondary | ICD-10-CM | POA: Insufficient documentation

## 2015-11-06 DIAGNOSIS — X58XXXA Exposure to other specified factors, initial encounter: Secondary | ICD-10-CM | POA: Diagnosis not present

## 2015-11-06 DIAGNOSIS — M5481 Occipital neuralgia: Secondary | ICD-10-CM | POA: Insufficient documentation

## 2015-11-06 DIAGNOSIS — R1084 Generalized abdominal pain: Secondary | ICD-10-CM | POA: Diagnosis not present

## 2015-11-06 DIAGNOSIS — Z5189 Encounter for other specified aftercare: Secondary | ICD-10-CM

## 2015-11-06 DIAGNOSIS — K219 Gastro-esophageal reflux disease without esophagitis: Secondary | ICD-10-CM | POA: Diagnosis not present

## 2015-11-06 DIAGNOSIS — F4310 Post-traumatic stress disorder, unspecified: Secondary | ICD-10-CM | POA: Insufficient documentation

## 2015-11-06 DIAGNOSIS — E119 Type 2 diabetes mellitus without complications: Secondary | ICD-10-CM | POA: Insufficient documentation

## 2015-11-06 DIAGNOSIS — Z9049 Acquired absence of other specified parts of digestive tract: Secondary | ICD-10-CM | POA: Diagnosis not present

## 2015-11-06 DIAGNOSIS — G9619 Other disorders of meninges, not elsewhere classified: Secondary | ICD-10-CM | POA: Diagnosis not present

## 2015-11-06 DIAGNOSIS — M4802 Spinal stenosis, cervical region: Secondary | ICD-10-CM | POA: Insufficient documentation

## 2015-11-06 DIAGNOSIS — R52 Pain, unspecified: Secondary | ICD-10-CM

## 2015-11-06 DIAGNOSIS — F329 Major depressive disorder, single episode, unspecified: Secondary | ICD-10-CM | POA: Insufficient documentation

## 2015-11-06 DIAGNOSIS — R51 Headache: Secondary | ICD-10-CM | POA: Diagnosis not present

## 2015-11-06 DIAGNOSIS — S7002XA Contusion of left hip, initial encounter: Secondary | ICD-10-CM | POA: Insufficient documentation

## 2015-11-06 DIAGNOSIS — M5412 Radiculopathy, cervical region: Secondary | ICD-10-CM | POA: Diagnosis not present

## 2015-11-06 DIAGNOSIS — G43909 Migraine, unspecified, not intractable, without status migrainosus: Secondary | ICD-10-CM | POA: Insufficient documentation

## 2015-11-06 DIAGNOSIS — M542 Cervicalgia: Secondary | ICD-10-CM | POA: Diagnosis not present

## 2015-11-06 DIAGNOSIS — Z7984 Long term (current) use of oral hypoglycemic drugs: Secondary | ICD-10-CM | POA: Insufficient documentation

## 2015-11-06 DIAGNOSIS — Z79891 Long term (current) use of opiate analgesic: Secondary | ICD-10-CM | POA: Diagnosis not present

## 2015-11-06 NOTE — Progress Notes (Signed)
Patient's Name: Monica LeepStacia L Woodyard MRN: [id number] DOB: 08-14-1950 DOS: 11/06/2015  Primary Reason(s) for Visit: Post-Procedure evaluation CC: Headache   HPI  Monica [name] is a 65 y.o. year old, female patient, who returns today as an established patient. She has Generalized abdominal pain; GERD (gastroesophageal reflux disease); Headache, migraine, intractable; Depression; Borderline personality disorder; Snoring; Dyspnea; Myofascial muscle pain; Cervical spondylosis; Cervical facet syndrome (Location of Secondary source of pain) (Bilateral) (L>R); Chronic pain; Cervicogenic headache (Location of Primary Source of Pain) (Bilateral) (L>R); Chronic neck pain (Location of Secondary source of pain) (Bilateral) (L>R); Long term current use of opiate analgesic; Long term prescription opiate use; Opiate use (600 MME/Day); Opiate dependence (HCC); Long-term current use of methadone for opiate dependence (HCC); 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 Her primarily concern today is the Headache   The patient returns to the clinics today for postprocedure evaluation after having had a bilateral, diagnostic, greater occipital nerve block done under fluoroscopic guidance, without sedation on 10/19/2015. According to that note the patient initially had a pain score of 4/10 and after the procedure she was a 0/10.Today the patient has confirmed that for the duration of the local anesthetic she attained 100% relief of her headaches. She describes that for the first time in 19 years the headaches stopped for a while. Since the procedure the patient has been  able to sleep better with less neck pain. This would confirm that we are dealing with a chronic bilateral greater occipital neuralgia. Today we have gone over the possible alternatives for treatment of this condition. The alternatives are as follows: 1. Possible repeat of the bilateral greater occipital nerve block with local anesthetic and steroid. 2. Possible lesioning of the greater occipital nerve at the occipital ridge using cryo-analgesia. 3. Possible lesioning of the greater occipital nerve at the septal ridge using radiofrequency ablation. 4. Possible lesioning of the greater occipital nerve at its origin (C2 and TON) using radiofrequency ablation 5. Possible trial with subsequent implant of a [employer]tr[employer] peripheral nerve stimulator for the greater occipital nerve.  We have explained to the patient that we are capable of offering any and all of these solutions to her. However, we recommend that she go back and do her research on each one of these so that she can choose her past alternative. I have recommended that she go back and consult with Dr. Dow AdolphZachery Swartz on those alternatives. I have also informed the patient that because Dr. Riley KillSwartz is her primary pain physician, it is up to him to decide where to refer her.  Pain Assessment: Self-Reported Pain Score: 3  Reported level is compatible with observation Pain Type: Chronic pain Pain Location: Head (neck) Pain Orientation: Posterior Pain Descriptors / Indicators: Aching, Constant, Squeezing Pain Frequency: Constant  Date of Last Visit: 10/19/15 Service Provided on Last Visit: Procedure (greater occipital nerve block)  Controlled Substance Pharmacotherapy Assessment  Analgesic: Methadone 10 mg 2 tablets 3 times a day (60 mg/day) + meperidine 50 mg 1-1-1/2 tablets daily when necessary (50-75 mg/day) Pill Count: The patient's medications are being prescribed by her pain physician Dr. Faith RogueZachary Swartz. MME/day: 600 mg/day Pharmacologic  Plan: We will not be taking over at this patient's medication regiment and  any time in the future. This will continue to be managed by Dr. Faith Rogue.  Laboratory Workup  Last ED UDS: Lab Results  Component Value Date   THCU NEG 06/26/2015   COCAINSCRNUR NEG 06/26/2015   MDMA NEG 06/26/2015   AMPHETMU NEG 06/26/2015    Inflammation Markers No results found for: ESRSEDRATE, CRP  Renal Function Lab Results  Component Value Date   BUN 16 07/31/2012   CREATININE 1.10 07/31/2012   GFRAA 62* 07/31/2012   GFRNONAA 54* 07/31/2012    Hepatic Function Lab Results  Component Value Date   AST 18 05/04/2012   ALT 22 05/04/2012   ALBUMIN 3.9 05/04/2012    Electrolytes Lab Results  Component Value Date   NA 137 07/31/2012   K 4.5 08/03/2012   CL 103 07/31/2012   CALCIUM 9.7 07/31/2012    Post-Procedure Assessment  Procedure done on last visit: The patient returns to the clinics today for postprocedure evaluation after having had a bilateral, diagnostic, greater occipital nerve block done under fluoroscopic guidance, without sedation on 10/19/2015. According to that note the patient initially had a pain score of 4/10 and after the procedure she was a 0/10. Side-effects or Adverse reactions: None reported Sedation: No sedation used  Results: Ultra-Short Term Relief (First 1 hour after procedure): 100 %  No IV Analgesics or Anxiolytics given, therefore the benefit is completely due to Local Anesthetics Short Term Relief (Initial 4-6 hrs after procedure): 80 % Complete relief confirms area to be the source of pain Long Term Relief : 60 % (the pain increases due to activity) Long-term benefit would suggest an inflammatory etiology to the pain   Current Relief (Now): 60%  Persistent relief would suggest effective anti-inflammatory effects from steroids Interpretation of Results: The results of this diagnostic block to confirm that the patient's condition is that of a chronic  bilateral greater occipital neuralgia.  Allergies  Monica [name] is allergic to hydromorphone hcl; aspirin; capsaicin; depakote; milnacipran hcl; pristiq; and zanaflex.  Meds  The patient has a current medication list which includes the following prescription(s): glimepiride, lidocaine, meperidine, metaxalone, metformin, methadone, naproxen, promethazine, sumatriptan, and topiramate.  Current Outpatient Prescriptions on File Prior to Visit  Medication Sig  . glimepiride (AMARYL) 4 MG tablet Take 4 mg by mouth daily before breakfast.   . lidocaine (LIDODERM) 5 % APPLY 1 PATCH ONTO THE SKIN DAILY. REMOVE AND DISCARD WITHIN 12 HOURSOR AS DIRECTED--RIGHT TRAP/SHOULDER AREA.  Marland Kitchen meperidine (DEMEROL) 50 MG tablet Take 1-1.5 tablets (50-75 mg total) by mouth daily as needed for severe pain.  . metaxalone (SKELAXIN) 800 MG tablet TAKE (1) TABLET BY MOUTH FOUR TIMES DAILY AS NEEDED.  . metFORMIN (GLUCOPHAGE-XR) 500 MG 24 hr tablet Take 1,000 mg by mouth 2 (two) times daily.   . methadone (DOLOPHINE) 10 MG tablet Take 2 tablets (20 mg total) by mouth 3 (three) times daily.  . naproxen (NAPROSYN) 500 MG tablet TAKE 1 TABLET BY MOUTH TWICE DAILY WITH FOOD.  Marland Kitchen promethazine (PHENERGAN) 25 MG tablet Take 1 tablet (25 mg total) by mouth every 8 (eight) hours as needed for nausea or vomiting.  . SUMAtriptan (IMITREX) 100 MG tablet TAKE (1) TABLET BY MOUTH ONCE DAILY AS NEEDED.  Marland Kitchen topiramate (TOPAMAX) 50 MG tablet TAKE 2 TABLETS EVERY MORNING AND TAKE 4 TABLETS AT BEDTIME.   No current facility-administered medications on file prior to visit.    ROS  Constitutional: Afebrile, no chills, well hydrated and well nourished Gastrointestinal: negative Musculoskeletal:negative Neurological:  negative Behavioral/Psych: negative  PFSH  Medical:  Ms. Lavery  has a past medical history of Chronic nausea; Hemorrhoids (11/17/08); Chronic headaches; PTSD (post-traumatic stress disorder); DM (diabetes mellitus) (HCC);  Torticollis; Elevated liver enzymes; Headache(784.0); Migraine without aura, with intractable migraine, so stated, without mention of status migrainosus; Unspecified musculoskeletal disorders and symptoms referable to neck; Occipital neuralgia; Posttraumatic stress disorder; Torticollis; Cervicalgia; Complication of anesthesia; Renal lithiasis; Glaucoma; Seizure (HCC); Arthritis; and Cervical facet joint syndrome (04/22/2012). Family: family history includes Colon cancer (age of onset: 12) in her mother. Surgical:  has past surgical history that includes Colonoscopy (11/17/08); gastro empy study (03/03/10); Esophagogastroduodenoscopy (04/05/10); Cholecystectomy; Neck surgery (1998); Hand surgery; Breast lumpectomy; Craniotomy; Nephrolithotomy (05/13/2012); and Finger surgery. Tobacco:  reports that she has never smoked. She has never used smokeless tobacco. Alcohol:  reports that she does not drink alcohol. Drug:  reports that she does not use illicit drugs.  Physical Exam  Vitals:  Today's Vitals   11/06/15 0756 11/06/15 0757  BP: 106/90   Pulse: 56   Temp: 98.6 F (37 C)   TempSrc: Oral   Resp: 16   Height: 5\' 4"  (1.626 m)   Weight: 148 lb (67.132 kg)   SpO2: 96%   PainSc: 3  3   PainLoc: Head     Calculated BMI: Body mass index is 25.39 kg/(m^2). Overweight (25-29.9 kg/m2) - 20% higher incidence of chronic pain  General appearance: alert, cooperative, appears stated age and no distress Eyes: PERLA Respiratory: No evidence respiratory distress, no audible rales or ronchi and no use of accessory muscles of respiration  Assessment & Plan  Primary Diagnosis & Pertinent Problem List: The primary encounter diagnosis was Bilateral occipital neuralgia. A diagnosis of Pain management was also pertinent to this visit.  Visit Diagnosis: 1. Bilateral occipital neuralgia   2. Pain management     Problem-specific Plan(s): Occipital neuralgia (Location of Primary Source of Pain) (Bilateral)  (L>R) A bilateral diagnostic greater occipital nerve block done under fluoroscopic guidance without sedation confirmed that this is the patient's diagnosis. The following are alternatives for long-term treatment: 6. Possible repeat of the bilateral greater occipital nerve block with local anesthetic and steroid. 7. Possible lesioning of the greater occipital nerve at the occipital ridge using cryo-analgesia. 8. Possible lesioning of the greater occipital nerve at the septal ridge using radiofrequency ablation. 9. Possible lesioning of the greater occipital nerve at its origin (C2 and TON) using radiofrequency ablation 10. Possible trial with subsequent implant of a [employer]tronic peripheral nerve stimulator for the greater occipital nerve.    Plan of Care  Pharmacotherapy (Medications Ordered): No orders of the defined types were placed in this encounter.    Lab-work & Procedure Ordered: Orders Placed This Encounter  Procedures  . GREATER OCCIPITAL NERVE BLOCK    Standing Status: Standing     Number of Occurrences: 1     Standing Expiration Date: 11/05/2016    Scheduling Instructions:     Side: Bilateral     Sedation: With Sedation.     Timeframe: PRN Procedure. Patient will call.    Order Specific Question:  Where will this procedure be performed?    Answer:  [facility]  . [place] TRIAL    Contact [employer] to provide required equipment.    Standing Status: Standing     Number of Occurrences: 1     Standing Expiration Date: 11/05/2016    Scheduling Instructions:     Side: Bilateral (bilateral greater occipital nerve)  Sedation: Patient is requesting sedation     Timeframe: Patient will call.    Order Specific Question:  Where will this procedure be performed?    Answer:  [facility]  . Radiofrequency,Cervical    Standing Status: Standing     Number of Occurrences: 1     Standing Expiration Date: 11/05/2016    Scheduling Instructions:     Bilateral greater  occipital nerve.     PRN     With sedation    Order Specific Question:  Where will this procedure be performed?    Answer:  [facility]    Imaging Ordered: None  Interventional Therapies: Scheduled: None at this time. PRN Procedures:  1. Possible repeat of the bilateral greater occipital nerve block with local anesthetic and steroid. 2. Possible lesioning of the greater occipital nerve at the occipital ridge using cryo-analgesia. 3. Possible lesioning of the greater occipital nerve at the septal ridge using radiofrequency ablation. 4. Possible lesioning of the greater occipital nerve at its origin (C2 and TON) using radiofrequency ablation 5. Possible trial with subsequent implant of a [employer] peripheral nerve stimulator for the greater occipital nerve.   Referral(s) or Consult(s): None at this time.  Medications administered during this visit: Ms. Calef had no medications administered during this visit.  Future Appointments Date Time Provider Department Center  12/18/2015 11:40 AM Ranelle Oyster, MD [facility]    Primary Care Physician: Kirstie Peri, MD Location: [facility] Note by: Sydnee Levans. Laban Emperor, M.D, DABA, DABAPM, DABPM, DABIPP, FIPP  Pain Score Disclaimer: We use the NRS-11 scale. This is a self-reported, subjective measurement of pain severity with only modest accuracy. It is used primarily to identify changes within a particular patient. It must be understood that outpatient pain scales are significantly less accurate that those used for research, where they can be applied under ideal controlled circumstances with minimal exposure to variables. In reality, the score is likely to be a combination of pain intensity and pain affect, where pain affect describes the degree of emotional arousal or changes in action readiness caused by the sensory experience of pain. Factors such as social and work situation, setting, emotional state,  anxiety levels, expectation, and prior pain experience may influence pain perception and show large inter-individual differences that may also be affected by time variables.

## 2015-11-06 NOTE — Assessment & Plan Note (Signed)
A bilateral diagnostic greater occipital nerve block done under fluoroscopic guidance without sedation confirmed that this is the patient's diagnosis. The following are alternatives for long-term treatment: 1. Possible repeat of the bilateral greater occipital nerve block with local anesthetic and steroid. 2. Possible lesioning of the greater occipital nerve at the occipital ridge using cryo-analgesia. 3. Possible lesioning of the greater occipital nerve at the septal ridge using radiofrequency ablation. 4. Possible lesioning of the greater occipital nerve at its origin (C2 and TON) using radiofrequency ablation 5. Possible trial with subsequent implant of a [employer] peripheral nerve stimulator for the greater occipital nerve.

## 2015-11-06 NOTE — Progress Notes (Signed)
Safety precautions to be maintained throughout the outpatient stay will include: orient to surroundings, keep bed in low position, maintain call bell within reach at all times, provide assistance with transfer out of bed and ambulation.  No medications since Dr Laban EmperorNaveira does not prescribe.

## 2015-11-06 NOTE — Patient Instructions (Signed)
GENERAL RISKS AND COMPLICATIONS  What are the risk, side effects and possible complications? Generally speaking, most procedures are safe.  However, with any procedure there are risks, side effects, and the possibility of complications.  The risks and complications are dependent upon the sites that are lesioned, or the type of nerve block to be performed.  The closer the procedure is to the spine, the more serious the risks are.  Great care is taken when placing the radio frequency needles, block needles or lesioning probes, but sometimes complications can occur. 1. Infection: Any time there is an injection through the skin, there is a risk of infection.  This is why sterile conditions are used for these blocks.  There are four possible types of infection. 1. Localized skin infection. 2. Central Nervous System Infection-This can be in the form of Meningitis, which can be deadly. 3. Epidural Infections-This can be in the form of an epidural abscess, which can cause pressure inside of the spine, causing compression of the spinal cord with subsequent paralysis. This would require an emergency surgery to decompress, and there are no guarantees that the patient would recover from the paralysis. 4. Discitis-This is an infection of the intervertebral discs.  It occurs in about 1% of discography procedures.  It is difficult to treat and it may lead to surgery.        2. Pain: the needles have to go through skin and soft tissues, will cause soreness.       3. Damage to internal structures:  The nerves to be lesioned may be near blood vessels or    other nerves which can be potentially damaged.       4. Bleeding: Bleeding is more common if the patient is taking blood thinners such as  aspirin, Coumadin, Ticiid, Plavix, etc., or if he/she have some genetic predisposition  such as hemophilia. Bleeding into the spinal canal can cause compression of the spinal  cord with subsequent paralysis.  This would require an  emergency surgery to  decompress and there are no guarantees that the patient would recover from the  paralysis.       5. Pneumothorax:  Puncturing of a lung is a possibility, every time a needle is introduced in  the area of the chest or upper back.  Pneumothorax refers to free air around the  collapsed lung(s), inside of the thoracic cavity (chest cavity).  Another two possible  complications related to a similar event would include: Hemothorax and Chylothorax.   These are variations of the Pneumothorax, where instead of air around the collapsed  lung(s), you may have blood or chyle, respectively.       6. Spinal headaches: They may occur with any procedures in the area of the spine.       7. Persistent CSF (Cerebro-Spinal Fluid) leakage: This is a rare problem, but may occur  with prolonged intrathecal or epidural catheters either due to the formation of a fistulous  track or a dural tear.       8. Nerve damage: By working so close to the spinal cord, there is always a possibility of  nerve damage, which could be as serious as a permanent spinal cord injury with  paralysis.       9. Death:  Although rare, severe deadly allergic reactions known as "Anaphylactic  reaction" can occur to any of the medications used.      10. Worsening of the symptoms:  We can always make thing worse.    What are the chances of something like this happening? Chances of any of this occuring are extremely low.  By statistics, you have more of a chance of getting killed in a motor vehicle accident: while driving to the hospital than any of the above occurring .  Nevertheless, you should be aware that they are possibilities.  In general, it is similar to taking a shower.  Everybody knows that you can slip, hit your head and get killed.  Does that mean that you should not shower again?  Nevertheless always keep in mind that statistics do not mean anything if you happen to be on the wrong side of them.  Even if a procedure has a 1  (one) in a 1,000,000 (million) chance of going wrong, it you happen to be that one..Also, keep in mind that by statistics, you have more of a chance of having something go wrong when taking medications.  Who should not have this procedure? If you are on a blood thinning medication (e.g. Coumadin, Plavix, see list of "Blood Thinners"), or if you have an active infection going on, you should not have the procedure.  If you are taking any blood thinners, please inform your physician.  How should I prepare for this procedure?  Do not eat or drink anything at least six hours prior to the procedure.  Bring a driver with you .  It cannot be a taxi.  Come accompanied by an adult that can drive you back, and that is strong enough to help you if your legs get weak or numb from the local anesthetic.  Take all of your medicines the morning of the procedure with just enough water to swallow them.  If you have diabetes, make sure that you are scheduled to have your procedure done first thing in the morning, whenever possible.  If you have diabetes, take only half of your insulin dose and notify our nurse that you have done so as soon as you arrive at the clinic.  If you are diabetic, but only take blood sugar pills (oral hypoglycemic), then do not take them on the morning of your procedure.  You may take them after you have had the procedure.  Do not take aspirin or any aspirin-containing medications, at least eleven (11) days prior to the procedure.  They may prolong bleeding.  Wear loose fitting clothing that may be easy to take off and that you would not mind if it got stained with Betadine or blood.  Do not wear any jewelry or perfume  Remove any nail coloring.  It will interfere with some of our monitoring equipment.  NOTE: Remember that this is not meant to be interpreted as a complete list of all possible complications.  Unforeseen problems may occur.  BLOOD THINNERS The following drugs  contain aspirin or other products, which can cause increased bleeding during surgery and should not be taken for 2 weeks prior to and 1 week after surgery.  If you should need take something for relief of minor pain, you may take acetaminophen which is found in Tylenol,m Datril, Anacin-3 and Panadol. It is not blood thinner. The products listed below are.  Do not take any of the products listed below in addition to any listed on your instruction sheet.  A.P.C or A.P.C with Codeine Codeine Phosphate Capsules #3 Ibuprofen Ridaura  ABC compound Congesprin Imuran rimadil  Advil Cope Indocin Robaxisal  Alka-Seltzer Effervescent Pain Reliever and Antacid Coricidin or Coricidin-D  Indomethacin Rufen    Alka-Seltzer plus Cold Medicine Cosprin Ketoprofen S-A-C Tablets  Anacin Analgesic Tablets or Capsules Coumadin Korlgesic Salflex  Anacin Extra Strength Analgesic tablets or capsules CP-2 Tablets Lanoril Salicylate  Anaprox Cuprimine Capsules Levenox Salocol  Anexsia-D Dalteparin Magan Salsalate  Anodynos Darvon compound Magnesium Salicylate Sine-off  Ansaid Dasin Capsules Magsal Sodium Salicylate  Anturane Depen Capsules Marnal Soma  APF Arthritis pain formula Dewitt's Pills Measurin Stanback  Argesic Dia-Gesic Meclofenamic Sulfinpyrazone  Arthritis Bayer Timed Release Aspirin Diclofenac Meclomen Sulindac  Arthritis pain formula Anacin Dicumarol Medipren Supac  Analgesic (Safety coated) Arthralgen Diffunasal Mefanamic Suprofen  Arthritis Strength Bufferin Dihydrocodeine Mepro Compound Suprol  Arthropan liquid Dopirydamole Methcarbomol with Aspirin Synalgos  ASA tablets/Enseals Disalcid Micrainin Tagament  Ascriptin Doan's Midol Talwin  Ascriptin A/D Dolene Mobidin Tanderil  Ascriptin Extra Strength Dolobid Moblgesic Ticlid  Ascriptin with Codeine Doloprin or Doloprin with Codeine Momentum Tolectin  Asperbuf Duoprin Mono-gesic Trendar  Aspergum Duradyne Motrin or Motrin IB Triminicin  Aspirin  plain, buffered or enteric coated Durasal Myochrisine Trigesic  Aspirin Suppositories Easprin Nalfon Trillsate  Aspirin with Codeine Ecotrin Regular or Extra Strength Naprosyn Uracel  Atromid-S Efficin Naproxen Ursinus  Auranofin Capsules Elmiron Neocylate Vanquish  Axotal Emagrin Norgesic Verin  Azathioprine Empirin or Empirin with Codeine Normiflo Vitamin E  Azolid Emprazil Nuprin Voltaren  Bayer Aspirin plain, buffered or children's or timed BC Tablets or powders Encaprin Orgaran Warfarin Sodium  Buff-a-Comp Enoxaparin Orudis Zorpin  Buff-a-Comp with Codeine Equegesic Os-Cal-Gesic   Buffaprin Excedrin plain, buffered or Extra Strength Oxalid   Bufferin Arthritis Strength Feldene Oxphenbutazone   Bufferin plain or Extra Strength Feldene Capsules Oxycodone with Aspirin   Bufferin with Codeine Fenoprofen Fenoprofen Pabalate or Pabalate-SF   Buffets II Flogesic Panagesic   Buffinol plain or Extra Strength Florinal or Florinal with Codeine Panwarfarin   Buf-Tabs Flurbiprofen Penicillamine   Butalbital Compound Four-way cold tablets Penicillin   Butazolidin Fragmin Pepto-Bismol   Carbenicillin Geminisyn Percodan   Carna Arthritis Reliever Geopen Persantine   Carprofen Gold's salt Persistin   Chloramphenicol Goody's Phenylbutazone   Chloromycetin Haltrain Piroxlcam   Clmetidine heparin Plaquenil   Cllnoril Hyco-pap Ponstel   Clofibrate Hydroxy chloroquine Propoxyphen         Before stopping any of these medications, be sure to consult the physician who ordered them.  Some, such as Coumadin (Warfarin) are ordered to prevent or treat serious conditions such as "deep thrombosis", "pumonary embolisms", and other heart problems.  The amount of time that you may need off of the medication may also vary with the medication and the reason for which you were taking it.  If you are taking any of these medications, please make sure you notify your pain physician before you undergo any  procedures.         Radiofrequency Lesioning Radiofrequency lesioning is a procedure that is performed to relieve pain. The procedure is often used for back, neck, or arm pain. Radiofrequency lesioning involves the use of a machine that creates radio waves to make heat. During the procedure, the heat is applied to the nerve that carries the pain signal. The heat damages the nerve and interferes with the pain signal. Pain relief usually lasts for 6 months to 1 year. LET YOUR HEALTH CARE PROVIDER KNOW ABOUT: 2. Any allergies you have. 3. All medicines you are taking, including vitamins, herbs, eye drops, creams, and over-the-counter medicines. 4. Previous problems you or members of your family have had with the use of anesthetics. 5. Any blood disorders you have.   6. Previous surgeries you have had. 7. Any medical conditions you have. 8. Whether you are pregnant or may be pregnant. RISKS AND COMPLICATIONS Generally, this is a safe procedure. However, problems may occur, including:  Pain or soreness at the injection site.  Infection at the injection site.  Damage to nerves or blood vessels. BEFORE THE PROCEDURE  Ask your health care provider about:  Changing or stopping your regular medicines. This is especially important if you are taking diabetes medicines or blood thinners.  Taking medicines such as aspirin and ibuprofen. These medicines can thin your blood. Do not take these medicines before your procedure if your health care provider instructs you not to.  Follow instructions from your health care provider about eating or drinking restrictions.  Plan to have someone take you home after the procedure.  If you go home right after the procedure, plan to have someone with you for 24 hours. PROCEDURE  You will be given one or more of the following:  A medicine to help you relax (sedative).  A medicine to numb the area (local anesthetic).  You will be awake during the  procedure. You will need to be able to talk with the health care provider during the procedure.  With the help of a type of X-ray (fluoroscopy), the health care provider will insert a radiofrequency needle into the area to be treated.  Next, a wire that carries the radio waves (electrode) will be put through the radiofrequency needle. An electrical pulse will be sent through the electrode to verify the correct nerve. You will feel a tingling sensation, and you may have muscle twitching.  Then, the tissue that is around the needle tip will be heated by an electric current that is passed using the radiofrequency machine. This will numb the nerves.  A bandage (dressing) will be put on the insertion area after the procedure is done. The procedure may vary among health care providers and hospitals. AFTER THE PROCEDURE 12. Your blood pressure, heart rate, breathing rate, and blood oxygen level will be monitored often until the medicines you were given have worn off. 13. Return to your normal activities as directed by your health care provider.   This information is not intended to replace advice given to you by your health care provider. Make sure you discuss any questions you have with your health care provider.   Document Released: 03/27/2011 Document Revised: 04/19/2015 Document Reviewed: 09/05/2014 Elsevier Interactive Patient Education 2016 [employer] Facet Blocks Patient Information  Description: The facets are joints in the spine between the vertebrae.  Like any joints in the body, facets can become irritated and painful.  Arthritis can also effect the facets.  By injecting steroids and local anesthetic in and around these joints, we can temporarily block the nerve supply to them.  Steroids act directly on irritated nerves and tissues to reduce selling and inflammation which often leads to decreased pain.  Facet blocks may be done anywhere along the spine from the neck to the low back  depending upon the location of your pain.   After numbing the skin with local anesthetic (like Novocaine), a small needle is passed onto the facet joints under x-ray guidance.  You may experience a sensation of pressure while this is being done.  The entire block usually lasts about 15-25 minutes.   Conditions which may be treated by facet blocks:   Low back/buttock pain  Neck/shoulder pain  Certain types of headaches  Preparation for the  injection:   Do not eat any solid food or dairy products within 8 hours of your appointment.  You may drink clear liquid up to 3 hours before appointment.  Clear liquids include water, black coffee, juice or soda.  No milk or cream please.  You may take your regular medication, including pain medications, with a sip of water before your appointment.  Diabetics should hold regular insulin (if taken separately) and take 1/2 normal NPH dose the morning of the procedure.  Carry some sugar containing items with you to your appointment.  A driver must accompany you and be prepared to drive you home after your procedure.  Bring all your current medications with you.  An IV may be inserted and sedation may be given at the discretion of the physician.  A blood pressure cuff, EKG and other monitors will often be applied during the procedure.  Some patients may need to have extra oxygen administered for a short period.  You will be asked to provide medical information, including your allergies and medications, prior to the procedure.  We must know immediately if you are taking blood thinners (like Coumadin/Warfarin) or if you are allergic to IV iodine contrast (dye).  We must know if you could possible be pregnant.  Possible side-effects:   Bleeding from needle site  Infection (rare, may require surgery)  Nerve injury (rare)  Numbness & tingling (temporary)  Difficulty urinating (rare, temporary)  Spinal headache (a headache worse with upright  posture)  Light-headedness (temporary)  Pain at injection site (serveral days)  Decreased blood pressure (rare, temporary)  Weakness in arm/leg (temporary)  Pressure sensation in back/neck (temporary)   Call if you experience:   Fever/chills associated with headache or increased back/neck pain  Headache worsened by an upright position  New onset, weakness or numbness of an extremity below the injection site  Hives or difficulty breathing (go to the emergency room)  Inflammation or drainage at the injection site(s)  Severe back/neck pain greater than usual  New symptoms which are concerning to you  Please note:  Although the local anesthetic injected can often make your back or neck feel good for several hours after the injection, the pain will likely return. It takes 3-7 days for steroids to work.  You may not notice any pain relief for at least one week.  If effective, we will often do a series of 2-3 injections spaced 3-6 weeks apart to maximally decrease your pain.  After the initial series, you may be a candidate for a more permanent nerve block of the facets.  If you have any questions, please call #[phone] [facility]

## 2015-11-09 ENCOUNTER — Telehealth: Payer: Self-pay | Admitting: *Deleted

## 2015-11-09 NOTE — Telephone Encounter (Signed)
We did not discuss increasing demerol. If we had, I would have allowed her more. It is written for 1 to 1.5 daily. She should have thought about being out before she unilaterally increased the dosage. She'll have to wait for her script to be due

## 2015-11-09 NOTE — Telephone Encounter (Signed)
Monica [name] was notified.

## 2015-11-09 NOTE — Telephone Encounter (Signed)
Monica [name] called and says that you discussed her demerol at last appt.  It is not working taking 1.5 tablets but if she takes 2 it does work.  She has done that and is out early.  Her next fill is not due until 11/16/15.  She is asking if the pharmacy can fill early, otherwise she will be flat on her back over the weekend without it.

## 2015-12-18 ENCOUNTER — Encounter
Payer: Worker's Compensation | Attending: Physical Medicine & Rehabilitation | Admitting: Physical Medicine & Rehabilitation

## 2015-12-18 ENCOUNTER — Encounter: Payer: Self-pay | Admitting: Physical Medicine & Rehabilitation

## 2015-12-18 VITALS — BP 147/78 | HR 77 | Resp 16

## 2015-12-18 DIAGNOSIS — G43711 Chronic migraine without aura, intractable, with status migrainosus: Secondary | ICD-10-CM | POA: Diagnosis not present

## 2015-12-18 DIAGNOSIS — M797 Fibromyalgia: Secondary | ICD-10-CM

## 2015-12-18 DIAGNOSIS — F4310 Post-traumatic stress disorder, unspecified: Secondary | ICD-10-CM | POA: Insufficient documentation

## 2015-12-18 DIAGNOSIS — M5382 Other specified dorsopathies, cervical region: Secondary | ICD-10-CM

## 2015-12-18 DIAGNOSIS — M791 Myalgia: Secondary | ICD-10-CM | POA: Diagnosis not present

## 2015-12-18 DIAGNOSIS — G43919 Migraine, unspecified, intractable, without status migrainosus: Secondary | ICD-10-CM | POA: Diagnosis not present

## 2015-12-18 DIAGNOSIS — G8929 Other chronic pain: Secondary | ICD-10-CM | POA: Insufficient documentation

## 2015-12-18 DIAGNOSIS — M129 Arthropathy, unspecified: Secondary | ICD-10-CM | POA: Insufficient documentation

## 2015-12-18 DIAGNOSIS — M5481 Occipital neuralgia: Secondary | ICD-10-CM

## 2015-12-18 DIAGNOSIS — M542 Cervicalgia: Secondary | ICD-10-CM | POA: Insufficient documentation

## 2015-12-18 DIAGNOSIS — F603 Borderline personality disorder: Secondary | ICD-10-CM | POA: Insufficient documentation

## 2015-12-18 DIAGNOSIS — M1288 Other specific arthropathies, not elsewhere classified, other specified site: Secondary | ICD-10-CM

## 2015-12-18 DIAGNOSIS — M47812 Spondylosis without myelopathy or radiculopathy, cervical region: Secondary | ICD-10-CM

## 2015-12-18 DIAGNOSIS — M7918 Myalgia, other site: Secondary | ICD-10-CM

## 2015-12-18 MED ORDER — MEPERIDINE HCL 50 MG PO TABS: 50.0000 mg | ORAL_TABLET | Freq: Every day | ORAL | Status: DC | PRN

## 2015-12-18 MED ORDER — METHADONE HCL 10 MG PO TABS: 20.0000 mg | ORAL_TABLET | Freq: Three times a day (TID) | ORAL | Status: DC

## 2015-12-18 NOTE — Patient Instructions (Signed)
  PLEASE CALL ME WITH ANY PROBLEMS OR QUESTIONS (#[phone]).      

## 2015-12-18 NOTE — Progress Notes (Signed)
Subjective:    Patient ID: Monica [name], female    DOB: 1950/11/10, 65 y.o.   MRN: [id number]  HPI   [name] is here in follow up of her chronic pain. She had bilateral greater occipital nerve blocks on 10/19/15 which completely eradicated her headaches for about a week. Since then she has had return of her headaches. She is now considering cryotherapy vs radiofrequency ablation.   She remains on her methadone and demerol as prescribed. She is very excited about the potential of weaning these medications if further intervention proves successful.    Pain Inventory Average Pain 8 Pain Right Now 7 My pain is constant, sharp, burning, stabbing and aching  In the last 24 hours, has pain interfered with the following? General activity 8 Relation with others 8 Enjoyment of life 9 What TIME of day is your pain at its worst? morning Sleep (in general) Fair  Pain is worse with: bending and some activites Pain improves with: rest, heat/ice, pacing activities and medication Relief from Meds: 5  Mobility Do you have any goals in this area?  no  Function Do you have any goals in this area?  no  Neuro/Psych No problems in this area  Prior Studies Any changes since last visit?  no  Physicians involved in your care Any changes since last visit?  no   Family History  Problem Relation Age of Onset  . Colon cancer Mother [id number]   Social History   Social History  . Marital Status: Married    Spouse Name: N/A  . Number of Children: 3  . Years of Education: N/A   Occupational History  . disabled    Social History Main Topics  . Smoking status: Never Smoker   . Smokeless tobacco: Never Used  . Alcohol Use: No  . Drug Use: No  . Sexual Activity: Yes    Birth Control/ Protection: Post-menopausal   Other Topics Concern  . None   Social History Narrative   Past Surgical History  Procedure Laterality Date  . Colonoscopy  11/17/08    ext hemorrhoids  . Gastro empy study   03/03/10    normal  . Esophagogastroduodenoscopy  04/05/10    [name] (67F),erosive reflux esophagitis, small hiatal hernia,  . Cholecystectomy    . Neck surgery  1998  . Hand surgery    . Breast lumpectomy      left  . Craniotomy      secondary TBI  . Nephrolithotomy  05/13/2012    Procedure: NEPHROLITHOTOMY PERCUTANEOUS;  Surgeon: Marcine MatarStephen Dahlstedt, MD;  Location: [facility];  Service: Urology;  Laterality: Left;     . Finger surgery      removal cyst left pinky   Past Medical History  Diagnosis Date  . Chronic nausea     normal GES   . Hemorrhoids 11/17/08    Colonoscopy Dr Karilyn Cotaehman  . Chronic headaches     Dr Hermelinda MedicusSchwartz [place] [facility]  . PTSD (post-traumatic stress disorder)   . DM (diabetes mellitus) (HCC)   . Torticollis   . Elevated liver enzymes     hx of, normal 03/2010  . Headache(784.0)   . Migraine without aura, with intractable migraine, so stated, without mention of status migrainosus   . Unspecified musculoskeletal disorders and symptoms referable to neck     cervical/trapezius  . Occipital neuralgia   . Posttraumatic stress disorder   . Torticollis   . Cervicalgia   . Complication of anesthesia  had problem with local anesthesia-tried to assist Physician  . Renal lithiasis   . Glaucoma   . Seizure (HCC)     12 yrs ago-med related  . Arthritis   . Cervical facet joint syndrome 04/22/2012   BP 147/78 mmHg  Pulse 77  Resp 16  SpO2 95%  Opioid Risk Score:   Fall Risk Score:  `1  Depression screen PHQ 2/9  Depression screen [facility] 2/9 11/06/2015 10/19/2015 10/18/2015 10/02/2015 08/31/2015 08/23/2015 07/26/2015  Decreased Interest 0 0 0 0 0 1 0  Down, Depressed, Hopeless 0 0 0 0 0 1 0  PHQ - 2 Score 0 0 0 0 0 2 0  Altered sleeping - - - - - 2 -  Tired, decreased energy - - - - - 1 -  Change in appetite - - - - - 1 -  Feeling bad or failure about yourself  - - - - - 1 -  Trouble concentrating - - - - - 1 -  Moving slowly or fidgety/restless - - -  - - 1 -  Suicidal thoughts - - - - - 0 -  PHQ-9 Score - - - - - 9 -       Review of Systems  All other systems reviewed and are negative.      Objective:   Physical Exam  normal motor tone is noted in both LE's . DTR's appear to be 1+ to 2+ throughout. Muscle testing reveals 5/5 muscle strength of the upper extremity, and 5/5 of the lower extremity. Full range of motion in upper and lower extremities. Cervical ROM is more limited today.  tenderness with Palpation in the cervical and occipital region today  Neuro: Fine motor movements are normal in both hands.  Sensory is intact and symmetric to light touch, pinprick and proprioception.  DTR in the upper and lower extremity are present and symmetric 2+. No clonus is noted.  Patient arises from chair without difficulty. Tandem walk is stable.  M/S: pain with palpation over the left iliac crest. Neck rom is improved  Psych: in good spirits today. A little more subdued.    Assessment & Plan:   Assessment:  1. 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  2. Posttraumatic stress syndrome.  3. Greater occipital neuralgia (bilateral) confirmed by response to recent nerve block   PLAN:  1. Continue methadone rx. Continue demerol 50 mg qd prn for severe pain. Second rx's were given for next month  2. We discussed options today and given her tolerance of radiofrequency to the cervical spine already, this is probably the most logical next step. She had 100% drop in her pain levels after the March block which means her response to RF of the greater occipital nerve should be positive as well. I will defer to Dr. Laban Emperor for follow up  3. Continue topamax 4. Maintain cervical range of motion/posture exercises.  5. Continue k-tape, aromatherapy, and cervical traction also.  6. 15 minutes of face to face patient care time were spent during this visit. All questions  were encouraged and answered. I'll see her back in about 2 months.

## 2016-01-10 ENCOUNTER — Encounter: Payer: Worker's Compensation | Admitting: Physical Medicine & Rehabilitation

## 2016-01-25 ENCOUNTER — Other Ambulatory Visit: Payer: Self-pay | Admitting: Physical Medicine & Rehabilitation

## 2016-02-14 ENCOUNTER — Encounter: Payer: Self-pay | Admitting: Physical Medicine & Rehabilitation

## 2016-02-14 ENCOUNTER — Encounter
Payer: Worker's Compensation | Attending: Physical Medicine & Rehabilitation | Admitting: Physical Medicine & Rehabilitation

## 2016-02-14 VITALS — BP 143/85 | HR 52

## 2016-02-14 DIAGNOSIS — F4310 Post-traumatic stress disorder, unspecified: Secondary | ICD-10-CM | POA: Insufficient documentation

## 2016-02-14 DIAGNOSIS — Z5181 Encounter for therapeutic drug level monitoring: Secondary | ICD-10-CM

## 2016-02-14 DIAGNOSIS — G43711 Chronic migraine without aura, intractable, with status migrainosus: Secondary | ICD-10-CM | POA: Diagnosis not present

## 2016-02-14 DIAGNOSIS — F603 Borderline personality disorder: Secondary | ICD-10-CM | POA: Diagnosis not present

## 2016-02-14 DIAGNOSIS — G8929 Other chronic pain: Secondary | ICD-10-CM | POA: Insufficient documentation

## 2016-02-14 DIAGNOSIS — M5382 Other specified dorsopathies, cervical region: Secondary | ICD-10-CM | POA: Diagnosis not present

## 2016-02-14 DIAGNOSIS — M791 Myalgia: Secondary | ICD-10-CM | POA: Insufficient documentation

## 2016-02-14 DIAGNOSIS — M5481 Occipital neuralgia: Secondary | ICD-10-CM

## 2016-02-14 DIAGNOSIS — Z79899 Other long term (current) drug therapy: Secondary | ICD-10-CM

## 2016-02-14 DIAGNOSIS — M542 Cervicalgia: Secondary | ICD-10-CM | POA: Insufficient documentation

## 2016-02-14 DIAGNOSIS — M797 Fibromyalgia: Secondary | ICD-10-CM

## 2016-02-14 DIAGNOSIS — M129 Arthropathy, unspecified: Secondary | ICD-10-CM | POA: Diagnosis not present

## 2016-02-14 DIAGNOSIS — G43919 Migraine, unspecified, intractable, without status migrainosus: Secondary | ICD-10-CM | POA: Insufficient documentation

## 2016-02-14 DIAGNOSIS — M7918 Myalgia, other site: Secondary | ICD-10-CM

## 2016-02-14 DIAGNOSIS — M47812 Spondylosis without myelopathy or radiculopathy, cervical region: Secondary | ICD-10-CM

## 2016-02-14 DIAGNOSIS — M1288 Other specific arthropathies, not elsewhere classified, other specified site: Secondary | ICD-10-CM

## 2016-02-14 MED ORDER — METHADONE HCL 10 MG PO TABS: 20.0000 mg | ORAL_TABLET | Freq: Three times a day (TID) | ORAL | Status: DC

## 2016-02-14 MED ORDER — MEPERIDINE HCL 50 MG PO TABS: 50.0000 mg | ORAL_TABLET | Freq: Every day | ORAL | Status: DC | PRN

## 2016-02-14 NOTE — Progress Notes (Signed)
Subjective:    Patient ID: Monica [name], female    DOB: [birth year], 65 y.o.   MRN: [id number]  HPI   [name] is here in follow up of her chronic headaches/pain. She states that her head has really been bothering her over the last two months. Worker's Comp has not approved the ablations. She had a freon leak in her fridge and is worried that the large amount of leaking could have worsened her headaches. She is struggling walking or sewing due to the immense pain in her head/neck.   She continues on her medications as prescribed.     Pain Inventory Average Pain 10 Pain Right Now 9 My pain is constant, sharp, burning and stabbing  In the last 24 hours, has pain interfered with the following? General activity 10 Relation with others 10 Enjoyment of life 10 What TIME of day is your pain at its worst? morning, daytime Sleep (in general) Poor  Pain is worse with: walking, bending and some activites Pain improves with: rest and pacing activities Relief from Meds: 4  Mobility Do you have any goals in this area?  no  Function retired  Neuro/Psych No problems in this area  Prior Studies Any changes since last visit?  no  Physicians involved in your care Any changes since last visit?  no   Family History  Problem Relation Age of Onset  . Colon cancer Mother [id number]   Social History   Social History  . Marital Status: Married    Spouse Name: N/A  . Number of Children: 3  . Years of Education: N/A   Occupational History  . disabled    Social History Main Topics  . Smoking status: Never Smoker   . Smokeless tobacco: Never Used  . Alcohol Use: No  . Drug Use: No  . Sexual Activity: Yes    Birth Control/ Protection: Post-menopausal   Other Topics Concern  . None   Social History Narrative   Past Surgical History  Procedure Laterality Date  . Colonoscopy  11/17/08    ext hemorrhoids  . Gastro empy study  03/03/10    normal  . Esophagogastroduodenoscopy  04/05/10     [name] (37F),erosive reflux esophagitis, small hiatal hernia,  . Cholecystectomy    . Neck surgery  1998  . Hand surgery    . Breast lumpectomy      left  . Craniotomy      secondary TBI  . Nephrolithotomy  05/13/2012    Procedure: NEPHROLITHOTOMY PERCUTANEOUS;  Surgeon: Marcine MatarStephen Dahlstedt, MD;  Location: [facility];  Service: Urology;  Laterality: Left;     . Finger surgery      removal cyst left pinky   Past Medical History  Diagnosis Date  . Chronic nausea     normal GES   . Hemorrhoids 11/17/08    Colonoscopy Dr Karilyn Cotaehman  . Chronic headaches     Dr Hermelinda MedicusSchwartz [place] [facility]  . PTSD (post-traumatic stress disorder)   . DM (diabetes mellitus) (HCC)   . Torticollis   . Elevated liver enzymes     hx of, normal 03/2010  . Headache(784.0)   . Migraine without aura, with intractable migraine, so stated, without mention of status migrainosus   . Unspecified musculoskeletal disorders and symptoms referable to neck     cervical/trapezius  . Occipital neuralgia   . Posttraumatic stress disorder   . Torticollis   . Cervicalgia   . Complication of anesthesia  had problem with local anesthesia-tried to assist Physician  . Renal lithiasis   . Glaucoma   . Seizure (HCC)     12 yrs ago-med related  . Arthritis   . Cervical facet joint syndrome 04/22/2012   BP 143/85 mmHg  Pulse 52  SpO2 97%  Opioid Risk Score:   Fall Risk Score:  `1  Depression screen PHQ 2/9  Depression [facility] 11/06/2015 10/19/2015 10/18/2015 10/02/2015 08/31/2015 08/23/2015 07/26/2015  Decreased Interest 0 0 0 0 0 1 0  Down, Depressed, Hopeless 0 0 0 0 0 1 0  PHQ - 2 Score 0 0 0 0 0 2 0  Altered sleeping - - - - - 2 -  Tired, decreased energy - - - - - 1 -  Change in appetite - - - - - 1 -  Feeling bad or failure about yourself  - - - - - 1 -  Trouble concentrating - - - - - 1 -  Moving slowly or fidgety/restless - - - - - 1 -  Suicidal thoughts - - - - - 0 -  PHQ-9 Score - - - - - 9 -      Review of Systems  Constitutional: Negative.   Eyes: Negative.   Respiratory: Negative.   Cardiovascular: Negative.   Gastrointestinal: Negative.   Endocrine: Negative.   Genitourinary: Negative.   Musculoskeletal: Negative.   Skin: Negative.   Allergic/Immunologic: Negative.   Neurological: Positive for headaches.  Hematological: Negative.   Psychiatric/Behavioral: Negative.        Objective:   Physical Exam normal motor tone is noted in both LE's . DTR's appear to be 1+ to 2+ throughout. Muscle testing reveals 5/5 muscle strength of the upper extremity, and 5/5 of the lower extremity. Full range of motion in upper and lower extremities. Cervical ROM is more limited today. tenderness with Palpation in the cervical and occipital region today  Neuro: Fine motor movements are normal in both hands.  Sensory is intact and symmetric to light touch, pinprick and proprioception.  DTR in the upper and lower extremity are present and symmetric 2+. No clonus is noted.  Patient arises from chair without difficulty. Tandem walk is stable.  M/S: pain with palpation over the left iliac crest. Neck rom is improved  Psych: in good spirits today. A little more subdued.    Assessment & Plan:   Assessment:  1. 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  2. Posttraumatic stress syndrome.  3. Greater occipital neuralgia (bilateral) confirmed by response to recent nerve block    PLAN:  1. Continue methadone rx. Continue demerol 50 mg qd prn for severe pain. Second rx's were given for next month  2. From an interventional standpoint ablation remains the most logical next step. She had 100% drop in her pain levels after the March block which means her response to RF of the greater occipital nerve should be positive as well. Naveira for follow up  3. Continue topamax  4. Maintain cervical range of motion/posture  exercises.  5. Continue k-tape, aromatherapy, and cervical traction also.  6. 15 minutes of face to face patient care time were spent during this visit. All questions were encouraged and answered. I'll see her back in about 2 months.

## 2016-02-14 NOTE — Patient Instructions (Signed)
PLEASE CALL ME WITH ANY PROBLEMS OR QUESTIONS ([phone])  

## 2016-02-22 LAB — TOXASSURE SELECT,+ANTIDEPR,UR: PDF: 0

## 2016-02-22 NOTE — Progress Notes (Signed)
Urine drug screen for this encounter is consistent for prescribed medications.   

## 2016-04-04 ENCOUNTER — Encounter: Payer: Self-pay | Admitting: Pain Medicine

## 2016-04-04 ENCOUNTER — Ambulatory Visit: Payer: Worker's Compensation | Attending: Pain Medicine | Admitting: Pain Medicine

## 2016-04-04 VITALS — BP 106/62 | HR 57 | Temp 98.4°F | Resp 16 | Ht 64.0 in | Wt 140.0 lb

## 2016-04-04 DIAGNOSIS — M542 Cervicalgia: Secondary | ICD-10-CM | POA: Diagnosis not present

## 2016-04-04 DIAGNOSIS — G8929 Other chronic pain: Secondary | ICD-10-CM | POA: Diagnosis not present

## 2016-04-04 DIAGNOSIS — F329 Major depressive disorder, single episode, unspecified: Secondary | ICD-10-CM | POA: Diagnosis not present

## 2016-04-04 DIAGNOSIS — G8918 Other acute postprocedural pain: Secondary | ICD-10-CM | POA: Diagnosis not present

## 2016-04-04 DIAGNOSIS — R1084 Generalized abdominal pain: Secondary | ICD-10-CM | POA: Diagnosis not present

## 2016-04-04 DIAGNOSIS — R51 Headache: Secondary | ICD-10-CM | POA: Diagnosis present

## 2016-04-04 DIAGNOSIS — K219 Gastro-esophageal reflux disease without esophagitis: Secondary | ICD-10-CM | POA: Diagnosis not present

## 2016-04-04 DIAGNOSIS — M47892 Other spondylosis, cervical region: Secondary | ICD-10-CM | POA: Insufficient documentation

## 2016-04-04 DIAGNOSIS — F603 Borderline personality disorder: Secondary | ICD-10-CM | POA: Diagnosis not present

## 2016-04-04 DIAGNOSIS — Z79891 Long term (current) use of opiate analgesic: Secondary | ICD-10-CM | POA: Diagnosis not present

## 2016-04-04 DIAGNOSIS — M5481 Occipital neuralgia: Secondary | ICD-10-CM | POA: Insufficient documentation

## 2016-04-04 DIAGNOSIS — M4802 Spinal stenosis, cervical region: Secondary | ICD-10-CM | POA: Diagnosis not present

## 2016-04-04 DIAGNOSIS — G9619 Other disorders of meninges, not elsewhere classified: Secondary | ICD-10-CM | POA: Insufficient documentation

## 2016-04-04 DIAGNOSIS — F1190 Opioid use, unspecified, uncomplicated: Secondary | ICD-10-CM | POA: Diagnosis not present

## 2016-04-04 DIAGNOSIS — M47812 Spondylosis without myelopathy or radiculopathy, cervical region: Secondary | ICD-10-CM | POA: Diagnosis not present

## 2016-04-04 MED ORDER — FENTANYL CITRATE (PF) 100 MCG/2ML IJ SOLN
25.0000 ug | INTRAMUSCULAR | Status: DC | PRN
  Filled 2016-04-04: qty 2

## 2016-04-04 MED ORDER — DEXAMETHASONE SODIUM PHOSPHATE 10 MG/ML IJ SOLN
10.0000 mg | Freq: Once | INTRAMUSCULAR | Status: AC
  Administered 2016-04-04: 10 mg
  Filled 2016-04-04: qty 1

## 2016-04-04 MED ORDER — ROPIVACAINE HCL 2 MG/ML IJ SOLN
9.0000 mL | Freq: Once | INTRAMUSCULAR | Status: AC
  Administered 2016-04-04: 9 mL
  Filled 2016-04-04: qty 10

## 2016-04-04 MED ORDER — MIDAZOLAM HCL 5 MG/5ML IJ SOLN
1.0000 mg | INTRAMUSCULAR | Status: DC | PRN
  Filled 2016-04-04: qty 5

## 2016-04-04 MED ORDER — OXYCODONE-ACETAMINOPHEN 5-325 MG PO TABS: 1.0000 | ORAL_TABLET | Freq: Three times a day (TID) | ORAL | 0 refills | Status: DC | PRN

## 2016-04-04 MED ORDER — LACTATED RINGERS IV SOLN
1000.0000 mL | Freq: Once | INTRAVENOUS | Status: AC
  Administered 2016-04-04: 1000 mL via INTRAVENOUS

## 2016-04-04 MED ORDER — LIDOCAINE HCL (PF) 1 % IJ SOLN
10.0000 mL | Freq: Once | INTRAMUSCULAR | Status: AC
  Administered 2016-04-04: 10 mL

## 2016-04-04 NOTE — Progress Notes (Signed)
Patient's Name: Monica [name]  Patient type: Established  MRN: [id number]  Service setting: Ambulatory outpatient  DOB: 1951/04/02  Location: [facility]  DOS: 04/04/2016  Primary Care Physician: Monico Blitz, MD  Note by: Kathlen Brunswick. Dossie Arbour, M.D, DABA, DABAPM, DABPM, Milagros Evener, FIPP  Referring Physician: Milinda Pointer, MD  Specialty: Board-Certified Interventional Pain Management  Last Visit to Pain Management: 11/06/2015   Primary Reason(s) for Visit: Interventional Pain Management Treatment. CC: Headache  Bilateral occipital neuralgia [M54.81]   Procedure:  Anesthesia, Analgesia, Anxiolysis:  Type: Therapeutic, Greater, Occipital Nerve Block Radiofrequency Ablation Region: Posterolateral Cervical Level: Occipital Ridge   Laterality: Bilateral  Indications: 1. Bilateral occipital neuralgia   2. Cervical spondylosis   3. Opiate use (600 MME/Day)   4. Acute postoperative pain     Pre-procedure Pain Score: 10/10 Reported level of pain is compatible with clinical observations Post-procedure Pain Score: 0-No pain  Type: Moderate (Conscious) Sedation & Local Anesthesia Local Anesthetic: Lidocaine 1% Route: Intravenous (IV) IV Access: Secured Sedation: Meaningful verbal contact was maintained at all times during the procedure  Indication(s): Analgesia & Anxiolysis   Pre-Procedure Assessment:  Monica [name] is a 65 y.o. year old, female patient, seen today for interventional treatment. She has Generalized abdominal pain; GERD (gastroesophageal reflux disease); Headache, migraine, intractable; Depression; Borderline personality disorder; Snoring; Dyspnea; Myofascial muscle pain; Cervical spondylosis; Cervical facet syndrome (Location of Secondary source of pain) (Bilateral) (L>R); Chronic pain; Cervicogenic headache (Location of Primary Source of Pain) (Bilateral) (L>R); Chronic neck pain (Location of Secondary source of pain) (Bilateral) (L>R); Long term  current use of opiate analgesic; Long term prescription opiate use; Opiate use (600 MME/Day); Opiate dependence ([place]); Long-term current use of methadone for opiate dependence ([place]); 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 Her primarily concern today is the Headache   Pain Type: Chronic pain Pain Location: Head Pain Descriptors / Indicators: Pounding Pain Frequency: Constant  Date of Last Visit: 11/06/15 Service Provided on Last Visit: Evaluation  Coagulation Parameters Lab Results  Component Value Date   INR 1.00 05/08/2012   LABPROT 13.1 05/08/2012   APTT 30 05/08/2012   PLT 141 (L) 05/15/2012    Verification of the correct person, correct site (including marking of site), and correct procedure were performed and confirmed by the patient.  Consent: Secured. Under the influence of no sedatives a written informed consent was obtained, after having provided information on the risks and possible complications. To fulfill our ethical and legal obligations, as recommended by the [name], we have provided information to the patient about our clinical impression; the nature and purpose of the treatment or procedure; the risks, benefits, and possible complications of the intervention; alternatives; the risk(s) and benefit(s) of the alternative treatment(s) or procedure(s); and the risk(s) and benefit(s) of doing nothing. The patient was provided information about the risks and possible complications associated with the procedure. These include, but are not limited  to, failure to achieve desired goals, infection, bleeding, organ or nerve damage, allergic reactions, paralysis, and death. In the case of spinal procedures these may include, but are not limited to, failure to achieve desired goals, infection, bleeding, organ or nerve damage, allergic  reactions, paralysis, and death. In addition, the patient was informed that Medicine is not an exact science; therefore, there is also the possibility of unforeseen risks and possible complications that may result in a catastrophic outcome. The patient indicated having understood very clearly. We have given the patient no guarantees and we have made no promises. Enough time was given to the patient to ask questions, all of which were answered to the patient's satisfaction.  Consent Attestation: I, the ordering provider, attest that I have discussed with the patient the benefits, risks, side-effects, alternatives, likelihood of achieving goals, and potential problems during recovery for the procedure that I have provided informed consent.  Pre-Procedure Preparation: Safety Precautions: Allergies reviewed. Appropriate site, procedure, and patient were confirmed by following the Joint Commission's Universal Protocol (UP.01.01.01), in the form of a "Time Out". The patient was asked to confirm marked site and procedure, before commencing. The patient was asked about blood thinners, or active infections, both of which were denied. Patient was assessed for positional comfort and all pressure points were checked before starting procedure. Allergies: She is allergic to hydromorphone hcl; aspirin; capsaicin; depakote [divalproex sodium]; milnacipran hcl; pristiq [desvenlafaxine succinate monohydrate]; and zanaflex [tizanidine hydrochloride].. Infection Control Precautions: Sterile technique used. Standard Universal Precautions were taken as recommended by the Department of [facility] for Disease Control and Prevention (CDC).  Standard pre-surgical skin prep was conducted. Respiratory hygiene and cough etiquette was practiced. Hand hygiene observed. Safe injection practices and needle disposal techniques followed. SDV (single dose vial) medications used. Medications properly checked for expiration dates and contaminants. Personal protective equipment (PPE) used: Sterile Radiation-resistant gloves. Monitoring:  As per clinic protocol. Vitals:   04/04/16 1322 04/04/16 1332 04/04/16 1342 04/04/16 1352  BP: 111/73 (!) 114/56 (!) 104/58 106/62  Pulse:      Resp: _0 Temp:      TempSrc:      SpO2: 95% 95% 96% 95%  Weight:      Height:      Calculated BMI: Body mass index is 24.03 kg/m.  Description of Procedure Process:   Time-out: "Time-out" completed before starting procedure, as per protocol. Position: Prone Target Area: Area medial to the occipital artery at the level of the superior nuchal ridge Approach: Posterior approach Area Prepped: Entire Posterior Occipital Region Prepping solution: ChloraPrep (2% chlorhexidine gluconate and 70% isopropyl alcohol) Safety Precautions: Aspiration looking for blood return was conducted prior to all injections. At no point did we inject any substances, as a needle was being advanced. No attempts were made at seeking any paresthesias. Safe injection practices and needle disposal techniques used. Medications properly checked for expiration dates. SDV (single dose vial) medications used.   Description of the Procedure: Protocol guidelines were followed. The patient was placed in position over the fluoroscopy table. The target area was identified and the area prepped in the usual manner. Skin & deeper tissues infiltrated with local anesthetic. Appropriate amount of time allowed to pass for local anesthetics to take effect. The Radiofrequency needle was used to reach the area of the occipital nerve. Using a Radiofrequency Generator, sensory stimulation using 50 Hz was used to  locate & identify the nerve, making sure that the needle was positioned such that there was no sensory stimulation below 0.3 V or above 0.7 V. Stimulation using 2 Hz was used to evaluate the motor component. Care was taken not to lesion any nerves that demonstrated motor stimulation of the lower extremities at an output of  less than 2.5 times that of the sensory threshold, or a maximum of 2.0 V. Once satisfactory placement of the needles was achieved, the below described solution was slowly injected after negative aspiration. After waiting for at least 3 minutes, the ablation was performed at 80 degrees C for 60 seconds. The needle was then removed and the area cleansed, making sure to leave some of the prepping solution back to take advantage of its long term bactericidal properties. EBL: Minimal Materials & Medications Used:  Needle(s) Used: Teflon-Coated Radiofrequency Needles Solution Injected: 0.2% PF-Ropivacaine (41m) + SDV-Decadron 10 mg/ml (126m  Imaging Guidance:  Type of Imaging Technique: Fluoroscopy Guidance Indication(s): Assistance in needle guidance and placement for procedures requiring needle placement in or near specific anatomical locations not easily accessible without such assistance. Exposure Time: Please see nurses notes. Fluoroscopic Guidance: I was personally present in the fluoroscopy suite, where the patient was placed in position for the procedure, over the fluoroscopy-compatible table. Fluoroscopy was manipulated to obtain the best possible view of the target area, on the affected side. Permanently recorded images stored by scanning into EMR. Interpretation: No contrast injected. Intraoperative imaging interpretation by performing Physician. Adequate needle placement confirmed in AP & Lateral Views. Permanent images scanned into the patient's record.  Antibiotic Prophylaxis:  Indication(s): No indications identified. Type:  Antibiotics Given (last 72 hours)    None        Post-operative Assessment:  Complications: No immediate post-treatment complications were observed. Disposition: The patient was discharged home, once institutional criteria were met. Return to clinic in 2 weeks for follow-up evaluation and interpretation of results. The patient tolerated the entire procedure well. A repeat set of vitals were taken after the procedure and the patient was kept under observation following institutional policy, for this type of procedure. Post-procedural neurological assessment was performed, showing return to baseline, prior to discharge. The patient was provided with post-procedure discharge instructions, including a section on how to identify potential problems. Should any problems arise concerning this procedure, the patient was given instructions to immediately contact usKoreaat any time, without hesitation. In any case, we plan to contact the patient by telephone for a follow-up status report regarding this interventional procedure. Comments:  No additional relevant information.  Plan of Care   Problem List Items Addressed This Visit      High   Cervical spondylosis (Chronic)   Relevant Medications   fentaNYL (SUBLIMAZE) injection 25-50 mcg   dexamethasone (DECADRON) injection 10 mg (Completed)   oxyCODONE-acetaminophen (PERCOCET) 5-325 MG tablet   Occipital neuralgia (Location of Primary Source of Pain) (Bilateral) (L>R) - Primary (Chronic)   Relevant Medications   fentaNYL (SUBLIMAZE) injection 25-50 mcg   lactated ringers infusion 1,000 mL (Completed)   midazolam (VERSED) 5 MG/5ML injection 1-2 mg   dexamethasone (DECADRON) injection 10 mg (Completed)   lidocaine (PF) (XYLOCAINE) 1 % injection 10 mL (Completed)   ropivacaine (PF) 2 mg/ml (0.2%) (NAROPIN) epidural 9 mL (Completed)   oxyCODONE-acetaminophen (PERCOCET) 5-325 MG tablet     Medium   Opiate use (600 MME/Day) (Chronic)     Unprioritized   Acute postoperative pain   Relevant Medications    oxyCODONE-acetaminophen (PERCOCET) 5-325 MG tablet    Other Visit Diagnoses   None.     Requested PM Follow-up: Return in about 6 weeks (around 05/16/2016) for Post-Procedure evaluation.  Future Appointments Date Time Provider [place]8/30/2017 10:40 AM ZaMeredith StaggersMD [facility]  05/15/2016 10:20 AM FrMilinda PointerMD [facility]  None    Primary Care Physician: Monico Blitz, MD Location: [facility] Note by: Kathlen Brunswick. Dossie Arbour, M.D, DABA, DABAPM, DABPM, DABIPP, FIPP  Disclaimer:  Medicine is not an exact science. The only guarantee in medicine is that nothing is guaranteed. It is important to note that the decision to proceed with this intervention was based on the information collected from the patient. The Data and conclusions were drawn from the patient's questionnaire, the interview, and the physical examination. Because the information was provided in large part by the patient, it cannot be guaranteed that it has not been purposely or unconsciously manipulated. Every effort has been made to obtain as much relevant data as possible for this evaluation. It is important to note that the conclusions that lead to this procedure are derived in large part from the available data. Always take into account that the treatment will also be dependent on availability of resources and existing treatment guidelines, considered by other Pain Management Practitioners as being common knowledge and practice, at the time of the intervention. For Medico-Legal purposes, it is also important to point out that variation in procedural techniques and pharmacological choices are the acceptable norm. The indications, contraindications, technique, and results of the above procedure should only be interpreted and judged by a Board-Certified Interventional Pain Specialist with extensive familiarity and expertise in the same exact procedure and technique. Attempts at  providing opinions without similar or greater experience and expertise than that of the treating physician will be considered as inappropriate and unethical, and shall result in a formal complaint to the state medical board and applicable specialty societies.

## 2016-04-04 NOTE — Patient Instructions (Signed)
Radiofrequency Lesioning Radiofrequency lesioning is a procedure that is performed to relieve pain. The procedure is often used for back, neck, or arm pain. Radiofrequency lesioning involves the use of a machine that creates radio waves to make heat. During the procedure, the heat is applied to the nerve that carries the pain signal. The heat damages the nerve and interferes with the pain signal. Pain relief usually lasts for 6 months to 1 year. LET YOUR HEALTH CARE PROVIDER KNOW ABOUT:  Any allergies you have.  All medicines you are taking, including vitamins, herbs, eye drops, creams, and over-the-counter medicines.  Previous problems you or members of your family have had with the use of anesthetics.  Any blood disorders you have.  Previous surgeries you have had.  Any medical conditions you have.  Whether you are pregnant or may be pregnant. RISKS AND COMPLICATIONS Generally, this is a safe procedure. However, problems may occur, including:  Pain or soreness at the injection site.  Infection at the injection site.  Damage to nerves or blood vessels. BEFORE THE PROCEDURE  Ask your health care provider about:  Changing or stopping your regular medicines. This is especially important if you are taking diabetes medicines or blood thinners.  Taking medicines such as aspirin and ibuprofen. These medicines can thin your blood. Do not take these medicines before your procedure if your health care provider instructs you not to.  Follow instructions from your health care provider about eating or drinking restrictions.  Plan to have someone take you home after the procedure.  If you go home right after the procedure, plan to have someone with you for 24 hours. PROCEDURE  You will be given one or more of the following:  A medicine to help you relax (sedative).  A medicine to numb the area (local anesthetic).  You will be awake during the procedure. You will need to be able to  talk with the health care provider during the procedure.  With the help of a type of X-ray (fluoroscopy), the health care provider will insert a radiofrequency needle into the area to be treated.  Next, a wire that carries the radio waves (electrode) will be put through the radiofrequency needle. An electrical pulse will be sent through the electrode to verify the correct nerve. You will feel a tingling sensation, and you may have muscle twitching.  Then, the tissue that is around the needle tip will be heated by an electric current that is passed using the radiofrequency machine. This will numb the nerves.  A bandage (dressing) will be put on the insertion area after the procedure is done. The procedure may vary among health care providers and hospitals. AFTER THE PROCEDURE  Your blood pressure, heart rate, breathing rate, and blood oxygen level will be monitored often until the medicines you were given have worn off.  Return to your normal activities as directed by your health care provider.   This information is not intended to replace advice given to you by your health care provider. Make sure you discuss any questions you have with your health care provider.   Document Released: 03/27/2011 Document Revised: 04/19/2015 Document Reviewed: 09/05/2014 [employer] Interactive Patient Education 2016 [employer] Pain Management Discharge Instructions  General Discharge Instructions :  If you need to reach your doctor call: Monday-Friday 8:00 am - 4:00 pm at [phone] or toll free [phone].  After clinic hours [phone] to have operator reach doctor.  Bring all of your medication bottles to all your   appointments in the pain clinic.  To cancel or reschedule your appointment with Pain Management please remember to call 24 hours in advance to avoid a fee.  Refer to the educational materials which you have been given on: General Risks, I had my Procedure. Discharge Instructions,  Post Sedation.  Post Procedure Instructions:  The drugs you were given will stay in your system until tomorrow, so for the next 24 hours you should not drive, make any legal decisions or drink any alcoholic beverages.  You may eat anything you prefer, but it is better to start with liquids then soups and crackers, and gradually work up to solid foods.  Please notify your doctor immediately if you have any unusual bleeding, trouble breathing or pain that is not related to your normal pain.  Depending on the type of procedure that was done, some parts of your body may feel week and/or numb.  This usually clears up by tonight or the next day.  Walk with the use of an assistive device or accompanied by an adult for the 24 hours.  You may use ice on the affected area for the first 24 hours.  Put ice in a Ziploc bag and cover with a towel and place against area 15 minutes on 15 minutes off.  You may switch to heat after 24 hours. 

## 2016-04-04 NOTE — Progress Notes (Signed)
Patient here for procedure d/t constant headaches.    Safety precautions to be maintained throughout the outpatient stay will include: orient to surroundings, keep bed in low position, maintain call bell within reach at all times, provide assistance with transfer out of bed and ambulation.

## 2016-04-05 ENCOUNTER — Telehealth: Payer: Self-pay

## 2016-04-05 NOTE — Telephone Encounter (Signed)
Post procedure phone call. Patient states she is doing good.  

## 2016-04-08 ENCOUNTER — Other Ambulatory Visit: Payer: Self-pay | Admitting: Physical Medicine & Rehabilitation

## 2016-04-09 ENCOUNTER — Other Ambulatory Visit: Payer: Self-pay | Admitting: *Deleted

## 2016-04-10 ENCOUNTER — Encounter: Payer: Self-pay | Admitting: Physical Medicine & Rehabilitation

## 2016-04-10 ENCOUNTER — Other Ambulatory Visit: Payer: Self-pay | Admitting: Physical Medicine & Rehabilitation

## 2016-04-10 ENCOUNTER — Encounter
Payer: Worker's Compensation | Attending: Physical Medicine & Rehabilitation | Admitting: Physical Medicine & Rehabilitation

## 2016-04-10 VITALS — BP 144/67 | HR 58 | Resp 14

## 2016-04-10 DIAGNOSIS — G43711 Chronic migraine without aura, intractable, with status migrainosus: Secondary | ICD-10-CM

## 2016-04-10 DIAGNOSIS — R51 Headache: Secondary | ICD-10-CM

## 2016-04-10 DIAGNOSIS — M47812 Spondylosis without myelopathy or radiculopathy, cervical region: Secondary | ICD-10-CM

## 2016-04-10 DIAGNOSIS — M129 Arthropathy, unspecified: Secondary | ICD-10-CM | POA: Insufficient documentation

## 2016-04-10 DIAGNOSIS — M5382 Other specified dorsopathies, cervical region: Secondary | ICD-10-CM

## 2016-04-10 DIAGNOSIS — Z79899 Other long term (current) drug therapy: Secondary | ICD-10-CM

## 2016-04-10 DIAGNOSIS — G43919 Migraine, unspecified, intractable, without status migrainosus: Secondary | ICD-10-CM | POA: Diagnosis not present

## 2016-04-10 DIAGNOSIS — F4310 Post-traumatic stress disorder, unspecified: Secondary | ICD-10-CM | POA: Diagnosis not present

## 2016-04-10 DIAGNOSIS — M7918 Myalgia, other site: Secondary | ICD-10-CM

## 2016-04-10 DIAGNOSIS — M542 Cervicalgia: Secondary | ICD-10-CM | POA: Insufficient documentation

## 2016-04-10 DIAGNOSIS — F603 Borderline personality disorder: Secondary | ICD-10-CM | POA: Diagnosis not present

## 2016-04-10 DIAGNOSIS — M797 Fibromyalgia: Secondary | ICD-10-CM

## 2016-04-10 DIAGNOSIS — M791 Myalgia: Secondary | ICD-10-CM | POA: Insufficient documentation

## 2016-04-10 DIAGNOSIS — Z79891 Long term (current) use of opiate analgesic: Secondary | ICD-10-CM

## 2016-04-10 DIAGNOSIS — G8918 Other acute postprocedural pain: Secondary | ICD-10-CM

## 2016-04-10 DIAGNOSIS — G8929 Other chronic pain: Secondary | ICD-10-CM | POA: Diagnosis not present

## 2016-04-10 DIAGNOSIS — G4486 Cervicogenic headache: Secondary | ICD-10-CM

## 2016-04-10 MED ORDER — MEPERIDINE HCL 50 MG PO TABS: 50.0000 mg | ORAL_TABLET | Freq: Every day | ORAL | 0 refills | Status: DC | PRN

## 2016-04-10 MED ORDER — OXYCODONE-ACETAMINOPHEN 5-325 MG PO TABS: 1.0000 | ORAL_TABLET | Freq: Three times a day (TID) | ORAL | 0 refills | Status: DC | PRN

## 2016-04-10 MED ORDER — METHADONE HCL 10 MG PO TABS: 20.0000 mg | ORAL_TABLET | Freq: Three times a day (TID) | ORAL | 0 refills | Status: DC

## 2016-04-10 MED ORDER — METHADONE HCL 10 MG PO TABS
20.0000 mg | ORAL_TABLET | Freq: Three times a day (TID) | ORAL | 0 refills | Status: DC
Start: 2016-04-10 — End: 2016-06-10

## 2016-04-10 NOTE — Patient Instructions (Signed)
PLEASE CALL ME WITH ANY PROBLEMS OR QUESTIONS ([phone])  

## 2016-04-10 NOTE — Addendum Note (Signed)
Addended by: Doreene ElandSHUMAKER, SYBIL W on: 04/10/2016 01:12 PM   Modules accepted: Orders

## 2016-04-10 NOTE — Progress Notes (Signed)
Rx for percocet was given in error (Dr Laban EmperorNaveira had prescribed after procedure) New Rx for Demerol was written and 2nd month given as per Dr Riley KillSwartz note. Monica [name] will return the percocet Rx and pick up the Demerol Rx

## 2016-04-10 NOTE — Progress Notes (Signed)
Subjective:    Patient ID: Monica [name] L [name], female    DOB: 14-Jan-1951, 65 y.o.   MRN: [id number]  HPI Monica [name] is back regarding her chronic pain. She had her greater occipital nerve RF with Dr Laban EmperorNaveira last Thursday and is doing so much better afterwards. She had no difficulties with the procedure. He did give her #60 percocet and she says she only took 2.She will bring to next visit to be destroyed properly.   Otherwise things are stable at home. She remains on her medications as prescribed including methadone and demerol..   Pain Inventory Average Pain 8 Pain Right Now 4 My pain is constant, sharp, burning, stabbing and aching  In the last 24 hours, has pain interfered with the following? General activity 4 Relation with others 4 Enjoyment of life 4 What TIME of day is your pain at its worst? morning Sleep (in general) Fair  Pain is worse with: bending and some activites Pain improves with: rest, heat/ice and pacing activities Relief from Meds: 5  Mobility Do you have any goals in this area?  no  Function retired Do you have any goals in this area?  no  Neuro/Psych No problems in this area  Prior Studies Any changes since last visit?  yes had procedure for neck pain  Physicians involved in your care Dr Laban EmperorNaveira   Family History  Problem Relation Age of Onset  . Colon cancer Mother 2160   Social History   Social History  . Marital status: Married    Spouse name: N/A  . Number of children: 3  . Years of education: N/A   Occupational History  . disabled Unemployed   Social History Main Topics  . Smoking status: Never Smoker  . Smokeless tobacco: Never Used  . Alcohol use No  . Drug use: No  . Sexual activity: Yes    Birth control/ protection: Post-menopausal   Other Topics Concern  . None   Social History Narrative  . None   Past Surgical History:  Procedure Laterality Date  . BREAST LUMPECTOMY     left  . CHOLECYSTECTOMY    . COLONOSCOPY   11/17/08   ext hemorrhoids  . CRANIOTOMY     secondary TBI  . ESOPHAGOGASTRODUODENOSCOPY  04/05/10   [name] (23F),erosive reflux esophagitis, small hiatal hernia,  . FINGER SURGERY     removal cyst left pinky  . gastro empy study  03/03/10   normal  . HAND SURGERY    . NECK SURGERY  1998  . NEPHROLITHOTOMY  05/13/2012   Procedure: NEPHROLITHOTOMY PERCUTANEOUS;  Surgeon: Marcine MatarStephen Dahlstedt, MD;  Location: [facility];  Service: Urology;  Laterality: Left;      Past Medical History:  Diagnosis Date  . Arthritis   . Cervical facet joint syndrome 04/22/2012  . Cervicalgia   . Chronic headaches    Dr Hermelinda MedicusSchwartz [place] [facility]  . Chronic nausea    normal GES   . Complication of anesthesia    had problem with local anesthesia-tried to assist Physician  . DM (diabetes mellitus) (HCC)   . Elevated liver enzymes    hx of, normal 03/2010  . Glaucoma   . Headache(784.0)   . Hemorrhoids 11/17/08   Colonoscopy Dr Karilyn Cotaehman  . Migraine without aura, with intractable migraine, so stated, without mention of status migrainosus   . Occipital neuralgia   . Posttraumatic stress disorder   . PTSD (post-traumatic stress disorder)   . Renal lithiasis   .  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 Torticollis   . Torticollis   . Unspecified musculoskeletal disorders and symptoms referable to neck    cervical/trapezius   BP (!) 144/67 (BP Location: Left Arm, Patient Position: Sitting, Cuff Size: Normal)   Pulse (!) 58   Resp 14   SpO2 93%   Opioid Risk Score:   Fall Risk Score:  `1  Depression screen PHQ 2/9  Depression screen [facility] 2/9 04/10/2016 04/04/2016 11/06/2015 10/19/2015 10/18/2015 10/02/2015 08/31/2015  Decreased Interest 3 3 0 0 0 0 0  Down, Depressed, Hopeless 3 3 0 0 0 0 0  PHQ - 2 Score 6 6 0 0 0 0 0  Altered sleeping - 3 - - - - -  Tired, decreased energy - 3 - - - - -  Change in appetite - 3 - - - - -  Feeling bad or failure about yourself  - 0 - - - - -  Trouble  concentrating - 3 - - - - -  Moving slowly or fidgety/restless - 3 - - - - -  Suicidal thoughts - 0 - - - - -  PHQ-9 Score - 21 - - - - -  Difficult doing work/chores - Not difficult at all - - - - -   Review of Systems  All other systems reviewed and are negative.      Objective:   Physical Exam  normal motor tone is noted in both LE's . DTR's appear to be 1+ to 2+ throughout. Muscle testing reveals 5/5 muscle strength of the upper extremity, and 5/5 of the lower extremity. Full range of motion in upper and lower extremities. Cervical ROM is more limited today. tenderness with Palpation in the cervical and occipital region today  Neuro: Fine motor movements are normal in both hands.  Sensory is intact and symmetric to light touch, pinprick and proprioception.  DTR in the upper and lower extremity are present and symmetric 2+. No clonus is noted.  Patient arises from chair without difficulty. Tandem walk is stable.  M/S: pain with palpation over the left iliac crest. Neck rom is improved  Psych: in good spirits today.  .    Assessment & Plan:   Assessment:  1. 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  2. Posttraumatic stress syndrome.  3. Greater occipital neuralgia (bilateral) confirmed by response to recent nerve block    PLAN:  1. Continue methadone rx. Continue demerol 50 mg qd prn for severe pain. Second rx's were given for next month. Will look at beginning to taper these if head remains stable over the next couple months. Monica [name] is in agreement with this. 2. S/p greater occipital nerve RF with initial good results. Should see even further relief over the next 1-2 weeks 3. Continue topamax  4. Maintain cervical range of motion/posture exercises.  5. Continue k-tape, aromatherapy, and cervical traction also.  6. 15 minutes of face to face patient care time were spent during this visit. All  questions were encouraged and answered. I'll see her back in about 2 months.

## 2016-04-23 ENCOUNTER — Ambulatory Visit: Payer: Worker's Compensation | Admitting: Pain Medicine

## 2016-05-08 ENCOUNTER — Other Ambulatory Visit: Payer: Self-pay | Admitting: Physical Medicine & Rehabilitation

## 2016-05-15 ENCOUNTER — Ambulatory Visit: Payer: Self-pay | Admitting: Pain Medicine

## 2016-05-20 ENCOUNTER — Encounter: Payer: Self-pay | Admitting: Pain Medicine

## 2016-05-20 ENCOUNTER — Ambulatory Visit: Payer: Worker's Compensation | Attending: Pain Medicine | Admitting: Pain Medicine

## 2016-05-20 VITALS — BP 159/88 | HR 74 | Temp 98.4°F | Resp 18 | Ht 64.0 in | Wt 140.0 lb

## 2016-05-20 DIAGNOSIS — R51 Headache: Secondary | ICD-10-CM | POA: Insufficient documentation

## 2016-05-20 DIAGNOSIS — Z7984 Long term (current) use of oral hypoglycemic drugs: Secondary | ICD-10-CM | POA: Insufficient documentation

## 2016-05-20 DIAGNOSIS — M542 Cervicalgia: Secondary | ICD-10-CM | POA: Diagnosis not present

## 2016-05-20 DIAGNOSIS — M1288 Other specific arthropathies, not elsewhere classified, other specified site: Secondary | ICD-10-CM | POA: Insufficient documentation

## 2016-05-20 DIAGNOSIS — Z79891 Long term (current) use of opiate analgesic: Secondary | ICD-10-CM | POA: Insufficient documentation

## 2016-05-20 DIAGNOSIS — Z5181 Encounter for therapeutic drug level monitoring: Secondary | ICD-10-CM | POA: Insufficient documentation

## 2016-05-20 DIAGNOSIS — F329 Major depressive disorder, single episode, unspecified: Secondary | ICD-10-CM | POA: Diagnosis not present

## 2016-05-20 DIAGNOSIS — G4486 Cervicogenic headache: Secondary | ICD-10-CM

## 2016-05-20 DIAGNOSIS — M9981 Other biomechanical lesions of cervical region: Secondary | ICD-10-CM | POA: Diagnosis not present

## 2016-05-20 DIAGNOSIS — K219 Gastro-esophageal reflux disease without esophagitis: Secondary | ICD-10-CM | POA: Insufficient documentation

## 2016-05-20 DIAGNOSIS — G8918 Other acute postprocedural pain: Secondary | ICD-10-CM | POA: Diagnosis not present

## 2016-05-20 DIAGNOSIS — M4802 Spinal stenosis, cervical region: Secondary | ICD-10-CM | POA: Diagnosis not present

## 2016-05-20 DIAGNOSIS — Z79899 Other long term (current) drug therapy: Secondary | ICD-10-CM | POA: Insufficient documentation

## 2016-05-20 DIAGNOSIS — M47812 Spondylosis without myelopathy or radiculopathy, cervical region: Secondary | ICD-10-CM

## 2016-05-20 DIAGNOSIS — F603 Borderline personality disorder: Secondary | ICD-10-CM | POA: Insufficient documentation

## 2016-05-20 NOTE — Progress Notes (Signed)
Safety precautions to be maintained throughout the outpatient stay will include: orient to surroundings, keep bed in low position, maintain call bell within reach at all times, provide assistance with transfer out of bed and ambulation.  

## 2016-05-20 NOTE — Progress Notes (Signed)
Patient's Name: Monica [name]  MRN: [id number]  Referring Provider: Kirstie Peri, MD  DOB: 1950/12/19  PCP: Kirstie Peri, MD  DOS: 05/20/2016  Note by: Sydnee Levans. Laban Emperor, MD  Service setting: Ambulatory outpatient  Specialty: Interventional Pain Management  Location: [facility] (AMB) Pain Management Facility    Patient type: Established   Primary Reason(s) for Visit: Encounter for prescription drug management & post-procedure evaluation of chronic illness with mild to moderate exacerbation(Level of risk: moderate) CC: Headache and Neck Pain  HPI  Monica [name] is a 65 y.o. year old, female patient, who comes today for an initial evaluation. 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 Her primarily concern today is the Headache and Neck Pain  Pain Assessment: Self-Reported Pain Score: 7  (pain scale sheet provided )/10 Clinically the patient looks like a 4/10 Reported level is inconsistent with clinical observations. Information on  the proper use of the pain score provided to the patient today. Pain Type: Chronic pain Pain Location: Neck Pain Orientation: Posterior Pain Descriptors / Indicators: Pounding, Sharp Pain Frequency: Constant  The patient comes into the clinics today for post-procedure evaluation on the interventional treatment done on 04/04/2016. In addition, she comes in today for pharmacological management of her chronic pain. No benefit from the radiofrequency. With the patient's foraminal stenosis in the upper cervical region, it is very likely that her occipital neuralgia is influenced by compression of the open her cervical nerve roots at the level of the spinal canal therefore making radiofrequency of the occipital nerve not effective for its treatment. At this point I believe that it is very likely that the severe facet arthropathy that she has at the C3-4 level is responsible for irritating the nerves as they're coming out of the spinal canal and therefore they are out of the reach of what I can offer her. The timeline followed by the relief that she obtained from the radiofrequency is likely to have been primarily provided by the steroids.  The patient  reports that she does not use drugs.  Date of Last Visit: 04/04/16 Service Provided on Last Visit: Procedure (bilateral GONB RF)  Controlled Substance Pharmacotherapy Assessment  Analgesic: Methadone 10 mg 2 tablets 3 times a day (60 mg/day) + meperidine 50 mg 1-1-1/2 tablets daily when necessary (50-75 mg/day) Pill Count: The patient's medications are being prescribed by her pain physician Dr. Faith Rogue. MME/day: 600 mg/day Pharmacologic Plan: We will not be taking over at this patient's medication regiment and any time in the future. This will continue to be managed by Dr. Faith Rogue  Post-Procedure Assessment  Procedure done on 04/04/2016: Bilateral greater occipital nerve radiofrequency ablation. Complications experienced at the time of the  procedure: None Side-effects or Adverse reactions: None reported Sedation: Sedation provided.  When no sedatives are used, the analgesic levels obtained are directly associated with the effectiveness of the local anesthetics. On the other hand, when sedation is provided, the level of analgesia obtained during the initial 1 hour, immediately following the intervention, is believed to be the result of a combination of factors. These factors may include, but are not limited to: 1. The effectiveness of the local anesthetics used. 2. The effects of the analgesic(s) and/or anxiolytic(s) used. 3. The degree of discomfort experienced by the patient at the time of the procedure. 4. The patients ability and reliability in recalling and recording the events. 5. The presence and influence of possible secondary gains. Results: Relief during the 1st hour after the procedure: 100 % (Ultra-Short Term Relief) Interpretative note: Analgesia during this period is likely to be Local Anesthetic and/or IV Sedative (Analgesic/Anxiolitic) related Local Anesthesia: Long-acting (4-6 hours) anesthetics used. The analgesic levels attained during this period are directly associated to the localized infiltration of local anesthetics and therefore cary significant diagnostic value as to the etiological location or origin of the pain. Results: Relief during the next 4 to 6 hour after the procedure: 100 % (sleeping ) (Short Term Relief) Interpretative note: Complete relief confirms area to be the source of pain Long-Term Therapy: Steroids used. Results: Extended relief following procedure: 80 % (sept 12th pain returned completely ) Interpretative note: No benefit could suggest etiology to be non-inflammatory, possibly mechanical compression or irritation According to the patient the relief lasted from 04/04/2016 to 04/23/2016. (19 days) Long-Term Benefits:  Current Relief (Now): 0%  Interpretative note: No benefit would suggest  neuropathy to be associated with permanent nerve damage, persistent neural entrapment, or mechanical compression/impingement, as opposed to an inflammatory-mediated neuropraxia Interpretation of Results: Recurrence of the pain 19 days after a radiofrequency would suggest failure of this technique to provide the patient with adequate relief. The procedure was done at the level of the greater occipital nerve since prior nerve blocks in that area have provided her with excellent relief. However, recurrence of the pain after 19 days goes more with steroid effects than radiofrequency itself. Because of this, I would suggest doing PRN greater occipital nerve blocks instead of attempting radiofrequency at that same level. On motor possible alternative would be to attempt going at the C2 + TON level, which is the origin of the occipital nerve for radiofrequency. However, if this is coming from the upper cervical region before the nerves, out of the spine, this technique would also not helped this patient.         Laboratory Chemistry  Inflammation Markers No results found for: ESRSEDRATE, CRP Renal Function Lab Results  Component Value Date   BUN 16 07/31/2012   CREATININE 1.10 07/31/2012   GFRAA 62 (L) 07/31/2012   GFRNONAA 54 (L) 07/31/2012   Hepatic Function Lab Results  Component Value Date   AST 18 05/04/2012   ALT 22 05/04/2012   ALBUMIN 3.9 05/04/2012   Electrolytes Lab Results  Component Value Date   NA 137 07/31/2012   K 4.5 08/03/2012   CL 103 07/31/2012   CALCIUM 9.7 07/31/2012   Pain Modulating Vitamins No results found for: Jerry Caras [id number], [id number], [id number], 25OHVITD1, 25OHVITD2, 25OHVITD3, VITAMINB12 Coagulation Parameters Lab Results  Component Value Date   INR 1.00 05/08/2012   LABPROT 13.1 05/08/2012   APTT 30 05/08/2012   PLT 141 (L) 05/15/2012   Cardiovascular Lab Results  Component Value Date   HGB 13.1 07/31/2012   HCT 40.3 07/31/2012  Note: Lab  results reviewed. Recent Diagnostic Imaging  No results found. Meds  The patient has a current medication list which includes the following prescription(s): glimepiride, lidocaine, meperidine, metaxalone, metformin, methadone, naproxen, promethazine, sumatriptan, and topiramate.  Current Outpatient Prescriptions on File Prior to Visit  Medication Sig  . glimepiride (AMARYL) 4 MG tablet Take 4 mg by mouth daily before breakfast.   . lidocaine (LIDODERM) 5 % APPLY 1 PATCH ONTO THE SKIN DAILY. REMOVE AND DISCARD WITHIN 12 HOURSOR AS DIRECTED--RIGHT TRAP/SHOULDER AREA.  Marland Kitchen meperidine (DEMEROL) 50 MG tablet Take 1-1.5 tablets (50-75 mg total) by mouth daily as needed for severe pain.  . metaxalone (SKELAXIN) 800 MG tablet TAKE (1) TABLET BY MOUTH FOUR TIMES DAILY AS NEEDED.  . metFORMIN (GLUCOPHAGE-XR) 500 MG 24 hr tablet Take 1,000 mg by mouth 2 (two) times daily.   . methadone (DOLOPHINE) 10 MG tablet Take 2 tablets (20 mg total) by mouth 3 (three) times daily.  . naproxen (NAPROSYN) 500 MG tablet TAKE 1 TABLET BY MOUTH TWICE DAILY WITH FOOD.  Marland Kitchen promethazine (PHENERGAN) 25 MG tablet Take 1 tablet (25 mg total) by mouth every 8 (eight) hours as needed for nausea or vomiting.  . SUMAtriptan (IMITREX) 100 MG tablet TAKE (1) TABLET BY MOUTH ONCE DAILY AS NEEDED.  Marland Kitchen topiramate (TOPAMAX) 50 MG tablet TAKE 2 TABLETS EVERY MORNING AND TAKE 4 TABLETS AT BEDTIME.   No current facility-administered medications on file prior to visit.    ROS  Constitutional: Denies any fever or chills Gastrointestinal: No reported hemesis, hematochezia, vomiting, or acute GI distress Musculoskeletal: Denies any acute onset joint swelling, redness, loss of ROM, or weakness Neurological: No reported episodes of acute onset apraxia, aphasia, dysarthria, agnosia, amnesia, paralysis, loss of coordination, or loss of consciousness  Allergies  Monica [name] is allergic to hydromorphone hcl; aspirin; capsaicin; depakote [divalproex  sodium]; milnacipran hcl; pristiq [desvenlafaxine succinate monohydrate]; and zanaflex [tizanidine hydrochloride].  PFSH  Medical:  Monica [name]  has a past medical history of Arthritis; Cervical facet joint syndrome (04/22/2012); Cervicalgia; Chronic headaches; Chronic nausea; Complication of anesthesia; DM (diabetes mellitus) (HCC); Elevated liver enzymes; Glaucoma; Headache(784.0); Hemorrhoids (11/17/08); Migraine without aura, with intractable migraine, so stated, without mention of status migrainosus; Occipital neuralgia; Posttraumatic stress disorder; PTSD (post-traumatic stress disorder); Renal lithiasis; Seizure (HCC); Torticollis; Torticollis; and Unspecified musculoskeletal disorders and symptoms referable to neck. Family: family history includes Colon cancer (age of onset: 93) in her mother. Surgical:  has a past surgical history that includes Colonoscopy (11/17/08); gastro empy study (03/03/10); Esophagogastroduodenoscopy (04/05/10); Cholecystectomy; Neck surgery (1998); Hand surgery; Breast lumpectomy; Craniotomy; Nephrolithotomy (05/13/2012); and Finger surgery. Tobacco:  reports that she has never smoked. She has never used smokeless tobacco. Alcohol:  reports that she does not drink alcohol. Drug:  reports that she does not use drugs.  Constitutional Exam  General appearance: Well nourished, well developed, and well hydrated. In no acute distress Vitals:   05/20/16 1108  BP: (!) 159/88  Pulse: 74  Resp: 18  Temp: 98.4 F (36.9 C)  SpO2: 100%  Weight: 140 lb (63.5 kg)  Height: 5\' 4"  (1.626 m)  BMI Assessment: Estimated body mass index is 24.03 kg/m as calculated from the following:   Height as of this encounter: 5\' 4"  (1.626 m).   Weight as of this encounter: 140 lb (63.5 kg).   BMI interpretation: (18.5-24.9 kg/m2) = Ideal body weight BMI Readings from Last 4 Encounters:  05/20/16 24.03 kg/m  04/04/16 24.03 kg/m  11/06/15 25.40 kg/m  10/19/15 25.23 kg/m   Wt Readings from  Last 4 Encounters:  05/20/16 140 lb (63.5 kg)  04/04/16 140 lb (63.5 kg)  11/06/15 148 lb (67.1 kg)  10/19/15 147 lb (66.7 kg)  Psych/Mental status: Alert and oriented x 3 (person, place, & time) Eyes: PERLA Respiratory: No evidence of acute respiratory distress  Cervical Spine Exam  Inspection: No masses, redness, or swelling Alignment: Symmetrical Functional ROM: Decreased ROM Stability: No instability detected Muscle strength & Tone: Functionally intact Sensory: Movement-associated pain Palpation: Tender  Upper Extremity (UE) Exam    Side: Right upper extremity  Side: Left upper extremity  Inspection: No masses, redness, swelling, or asymmetry  Inspection: No masses, redness, swelling, or asymmetry  Functional ROM: Unrestricted ROM         Functional ROM: Unrestricted ROM          Muscle strength & Tone: Functionally intact  Muscle strength & Tone: Functionally intact  Sensory: Unimpaired  Sensory: Unimpaired  Palpation: Non-contributory  Palpation: Non-contributory   Thoracic Spine Exam  Inspection: No masses, redness, or swelling Alignment: Symmetrical Functional ROM: Unrestricted ROM Stability: No instability detected Sensory: Unimpaired Muscle strength & Tone: Functionally intact Palpation: Non-contributory  Lumbar Spine Exam  Inspection: No masses, redness, or swelling Alignment: Symmetrical Functional ROM: Unrestricted ROM Stability: No instability detected Muscle strength & Tone: Functionally intact Sensory: Unimpaired Palpation: Non-contributory Provocative Tests: Lumbar Hyperextension and rotation test: evaluation deferred today       Patrick's Maneuver: evaluation deferred today              Gait & Posture Assessment  Ambulation: Unassisted Gait: Relatively normal for age and body habitus Posture: WNL   Lower Extremity Exam    Side: Right lower extremity  Side: Left lower extremity  Inspection: No masses, redness, swelling, or asymmetry  Inspection:  No masses, redness, swelling, or asymmetry  Functional ROM: Unrestricted ROM          Functional ROM: Unrestricted ROM          Muscle strength & Tone: Functionally intact  Muscle strength & Tone: Functionally intact  Sensory: Unimpaired  Sensory: Unimpaired  Palpation: Non-contributory  Palpation: Non-contributory   Assessment  Primary Diagnosis & Pertinent Problem List: The primary encounter diagnosis was Cervicogenic headache (Location of Primary Source of Pain) (Bilateral) (L>R). 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  Visit Diagnosis: 1. Cervicogenic headache (Location of Primary Source of Pain) (Bilateral) (L>R)   2. 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 Cervical facet arthropathy (severe at C3-4) (Bilateral) (L>R)    Plan of Care  Pharmacotherapy (Medications Ordered): No orders of the defined types were placed in this encounter.  New Prescriptions   No medications on file   Medications administered during this visit: Monica [name] had no medications administered during this visit. Lab-work, Procedure(s), & Referral(s) Ordered: No orders of the defined types were placed in this encounter.  Imaging & Referral(s) Ordered: None  Interventional Therapies: Scheduled: None at this time. PRN Procedures:  Possible lesioning of the greater occipital nerve at its origin (C2 and TON) using radiofrequency ablation. This is unlikely to provide her with further relief of the pain secondary to her foraminal stenosis. Possible trial with subsequent implant of a [employer] peripheral nerve stimulator for the greater occipital nerve. Based on the results to other tests, she may not be a good candidate for this type of therapy.  Requested PM Follow-up: Return for No return (D/C).  Future Appointments Date Time Provider Department Center  06/10/2016 10:40 AM Ranelle OysterZachary  T Swartz, MD [facility]    Primary Care Physician: Kirstie PeriSHAH,ASHISH, MD [facility] Note by: Sydnee LevansFrancisco A. Laban EmperorNaveira, M.D, DABA, DABAPM, DABPM, DABIPP, FIPP  Pain Score Disclaimer: We use the NRS-11 scale. This is a self-reported, subjective measurement of pain severity with only modest accuracy. It is used primarily to identify changes within a particular patient. It must be understood that outpatient pain scales are significantly less accurate that those used for research, where they can be applied under ideal controlled circumstances with minimal exposure to variables. In reality, the score is likely to be a combination of pain intensity and pain affect, where pain affect describes the degree of emotional arousal or changes in action readiness caused by the sensory experience of pain. Factors such as social and work situation, setting, emotional state, anxiety levels, expectation, and prior pain experience may influence pain perception and show large inter-individual differences that may also be affected by time variables.  Patient instructions provided during this appointment: There are no Patient Instructions on file for this visit.

## 2016-06-10 ENCOUNTER — Encounter
Payer: Worker's Compensation | Attending: Physical Medicine & Rehabilitation | Admitting: Physical Medicine & Rehabilitation

## 2016-06-10 ENCOUNTER — Encounter (INDEPENDENT_AMBULATORY_CARE_PROVIDER_SITE_OTHER): Payer: Self-pay

## 2016-06-10 ENCOUNTER — Encounter: Payer: Self-pay | Admitting: Physical Medicine & Rehabilitation

## 2016-06-10 VITALS — BP 150/80 | HR 53 | Resp 14

## 2016-06-10 DIAGNOSIS — Z5181 Encounter for therapeutic drug level monitoring: Secondary | ICD-10-CM | POA: Diagnosis not present

## 2016-06-10 DIAGNOSIS — M542 Cervicalgia: Secondary | ICD-10-CM | POA: Insufficient documentation

## 2016-06-10 DIAGNOSIS — G43011 Migraine without aura, intractable, with status migrainosus: Secondary | ICD-10-CM

## 2016-06-10 DIAGNOSIS — M7918 Myalgia, other site: Secondary | ICD-10-CM

## 2016-06-10 DIAGNOSIS — G43711 Chronic migraine without aura, intractable, with status migrainosus: Secondary | ICD-10-CM

## 2016-06-10 DIAGNOSIS — F603 Borderline personality disorder: Secondary | ICD-10-CM | POA: Diagnosis not present

## 2016-06-10 DIAGNOSIS — F4310 Post-traumatic stress disorder, unspecified: Secondary | ICD-10-CM | POA: Insufficient documentation

## 2016-06-10 DIAGNOSIS — Z79891 Long term (current) use of opiate analgesic: Secondary | ICD-10-CM

## 2016-06-10 DIAGNOSIS — M47812 Spondylosis without myelopathy or radiculopathy, cervical region: Secondary | ICD-10-CM

## 2016-06-10 DIAGNOSIS — G894 Chronic pain syndrome: Secondary | ICD-10-CM | POA: Diagnosis not present

## 2016-06-10 DIAGNOSIS — M791 Myalgia: Secondary | ICD-10-CM | POA: Diagnosis not present

## 2016-06-10 DIAGNOSIS — G8929 Other chronic pain: Secondary | ICD-10-CM | POA: Diagnosis present

## 2016-06-10 DIAGNOSIS — M1288 Other specific arthropathies, not elsewhere classified, other specified site: Secondary | ICD-10-CM | POA: Diagnosis not present

## 2016-06-10 DIAGNOSIS — G43919 Migraine, unspecified, intractable, without status migrainosus: Secondary | ICD-10-CM | POA: Diagnosis not present

## 2016-06-10 DIAGNOSIS — M129 Arthropathy, unspecified: Secondary | ICD-10-CM | POA: Insufficient documentation

## 2016-06-10 DIAGNOSIS — Z79899 Other long term (current) drug therapy: Secondary | ICD-10-CM

## 2016-06-10 DIAGNOSIS — G4486 Cervicogenic headache: Secondary | ICD-10-CM

## 2016-06-10 DIAGNOSIS — R51 Headache: Secondary | ICD-10-CM

## 2016-06-10 MED ORDER — MEPERIDINE HCL 50 MG PO TABS: 50.0000 mg | ORAL_TABLET | Freq: Every day | ORAL | 0 refills | Status: DC | PRN

## 2016-06-10 MED ORDER — PROMETHAZINE HCL 25 MG PO TABS: 25.0000 mg | ORAL_TABLET | Freq: Three times a day (TID) | ORAL | 4 refills | Status: DC | PRN

## 2016-06-10 MED ORDER — METHADONE HCL 10 MG PO TABS: 20.0000 mg | ORAL_TABLET | Freq: Three times a day (TID) | ORAL | 0 refills | Status: DC

## 2016-06-10 MED ORDER — TOPIRAMATE 50 MG PO TABS: ORAL_TABLET | ORAL | 5 refills | Status: DC

## 2016-06-10 MED ORDER — METAXALONE 800 MG PO TABS: ORAL_TABLET | ORAL | 5 refills | Status: DC

## 2016-06-10 MED ORDER — LACOSAMIDE 50 MG PO TABS: 50.0000 mg | ORAL_TABLET | Freq: Two times a day (BID) | ORAL | 3 refills | Status: DC

## 2016-06-10 NOTE — Progress Notes (Signed)
Subjective:    Patient ID: Dereck LeepStacia L Massiah, female    DOB: 1950-11-24, 65 y.o.   MRN: [id number]  HPI   [name] is here in follow up of her chronic pain. After some initial improvements with the RF, her headaches have recurred and essentially she's back to baseline. She and Dr. Laban EmperorNaveira have moved on as he had nothing more to offer her.   She continues on medications as I've prescribed. Sleep is inconsistent at best.    Pain Inventory Average Pain 7 Pain Right Now 7 My pain is constant, sharp, burning and aching  In the last 24 hours, has pain interfered with the following? General activity 8 Relation with others 9 Enjoyment of life 9 What TIME of day is your pain at its worst? night Sleep (in general) Poor  Pain is worse with: walking, bending and some activites Pain improves with: rest and heat/ice Relief from Meds: 6  Mobility Do you have any goals in this area?  no  Function Do you have any goals in this area?  no  Neuro/Psych No problems in this area  Prior Studies Any changes since last visit?  no  Physicians involved in your care Any changes since last visit?  no   Family History  Problem Relation Age of Onset  . Colon cancer Mother 5960   Social History   Social History  . Marital status: Married    Spouse name: N/A  . Number of children: 3  . Years of education: N/A   Occupational History  . disabled Unemployed   Social History Main Topics  . Smoking status: Never Smoker  . Smokeless tobacco: Never Used  . Alcohol use No  . Drug use: No  . Sexual activity: Yes    Birth control/ protection: Post-menopausal   Other Topics Concern  . None   Social History Narrative  . None   Past Surgical History:  Procedure Laterality Date  . BREAST LUMPECTOMY     left  . CHOLECYSTECTOMY    . COLONOSCOPY  11/17/08   ext hemorrhoids  . CRANIOTOMY     secondary TBI  . ESOPHAGOGASTRODUODENOSCOPY  04/05/10   [name] (33F),erosive reflux  esophagitis, small hiatal hernia,  . FINGER SURGERY     removal cyst left pinky  . gastro empy study  03/03/10   normal  . HAND SURGERY    . NECK SURGERY  1998  . NEPHROLITHOTOMY  05/13/2012   Procedure: NEPHROLITHOTOMY PERCUTANEOUS;  Surgeon: Marcine MatarStephen Dahlstedt, MD;  Location: [facility];  Service: Urology;  Laterality: Left;      Past Medical History:  Diagnosis Date  . Arthritis   . Cervical facet joint syndrome 04/22/2012  . Cervicalgia   . Chronic headaches    Dr Hermelinda MedicusSchwartz [place] [facility]  . Chronic nausea    normal GES   . Complication of anesthesia    had problem with local anesthesia-tried to assist Physician  . DM (diabetes mellitus) (HCC)   . Elevated liver enzymes    hx of, normal 03/2010  . Glaucoma   . Headache(784.0)   . Hemorrhoids 11/17/08   Colonoscopy Dr Karilyn Cotaehman  . Migraine without aura, with intractable migraine, so stated, without mention of status migrainosus   . Occipital neuralgia   . Posttraumatic stress disorder   . PTSD (post-traumatic stress disorder)   . Renal lithiasis   . 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 Torticollis   . Torticollis   .  Unspecified musculoskeletal disorders and symptoms referable to neck    cervical/trapezius   BP (!) 150/80   Pulse (!) 53   Resp 14   SpO2 98%   Opioid Risk Score:   Fall Risk Score:  `1  Depression screen PHQ 2/9  Depression screen [facility] 04/10/2016 04/04/2016 11/06/2015 10/19/2015 10/18/2015 10/02/2015 08/31/2015  Decreased Interest 3 3 0 0 0 0 0  Down, Depressed, Hopeless 3 3 0 0 0 0 0  PHQ - 2 Score 6 6 0 0 0 0 0  Altered sleeping - 3 - - - - -  Tired, decreased energy - 3 - - - - -  Change in appetite - 3 - - - - -  Feeling bad or failure about yourself  - 0 - - - - -  Trouble concentrating - 3 - - - - -  Moving slowly or fidgety/restless - 3 - - - - -  Suicidal thoughts - 0 - - - - -  PHQ-9 Score - 21 - - - - -  Difficult doing work/chores - Not difficult at all - - - - -    Review of  Systems  All other systems reviewed and are negative.      Objective:   Physical Exam  normal motor tone is noted in both LE's . DTR's appear to be 1+ to 2+ throughout. Muscle testing reveals 5/5 muscle strength of the upper extremity, and 5/5 of the lower extremity. Full range of motion in upper and lower extremities. Cervical ROM is more limited today. continued tenderness with Palpation in the cervical and occipital region today  Neuro: Fine motor movements are normal in both hands.  Sensory is intact and symmetric to light touch, pinprick and proprioception.  DTR in the upper and lower extremity are present and symmetric 2+. No clonus is noted.  Patient arises from chair without difficulty. Tandem walk is stable.  M/S: pain persists with palpation over the left iliac crest. Neck rom is improved  Psych: in good spirits today but still can be labile     Assessment & Plan:  Assessment:  1. 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  2. Posttraumatic stress syndrome.  3. Greater occipital neuralgia (bilateral) confirmed by response to recent nerve block    PLAN:  1. Continue methadone rx. Continue demerol 50 mg qd prn for severe pain. Second rx's were given for next month.   2. S/p greater occipital nerve RF with initial good results but relief did not persist. 3. Continue topamax. Add low dose vimpat to see if there is added benefit for headache prophylaxis 4. Maintain cervical range of motion/posture exercises.  5. Continue k-tape, aromatherapy, and cervical traction also.  6. Consider belsomra trial 7. 15 minutes of face to face patient care time were spent during this visit. All questions were encouraged and answered. I'll see her back in about 2 months.

## 2016-06-10 NOTE — Patient Instructions (Addendum)
PLEASE CALL ME WITH ANY PROBLEMS OR QUESTIONS [phone]   VIMPAT===50MG  AT NIGHT FOR 5 DAYS THEN TWICE DAILY PER BOTTLE INSTRUCTIONS

## 2016-06-27 DIAGNOSIS — E119 Type 2 diabetes mellitus without complications: Secondary | ICD-10-CM | POA: Diagnosis not present

## 2016-06-27 DIAGNOSIS — Z7984 Long term (current) use of oral hypoglycemic drugs: Secondary | ICD-10-CM | POA: Diagnosis not present

## 2016-06-27 DIAGNOSIS — H25813 Combined forms of age-related cataract, bilateral: Secondary | ICD-10-CM | POA: Diagnosis not present

## 2016-06-27 DIAGNOSIS — H5213 Myopia, bilateral: Secondary | ICD-10-CM | POA: Diagnosis not present

## 2016-08-07 ENCOUNTER — Encounter
Payer: Worker's Compensation | Attending: Physical Medicine & Rehabilitation | Admitting: Physical Medicine & Rehabilitation

## 2016-08-07 ENCOUNTER — Encounter: Payer: Self-pay | Admitting: Physical Medicine & Rehabilitation

## 2016-08-07 VITALS — BP 131/73 | HR 51

## 2016-08-07 DIAGNOSIS — M129 Arthropathy, unspecified: Secondary | ICD-10-CM | POA: Diagnosis not present

## 2016-08-07 DIAGNOSIS — M542 Cervicalgia: Secondary | ICD-10-CM | POA: Diagnosis not present

## 2016-08-07 DIAGNOSIS — G8929 Other chronic pain: Secondary | ICD-10-CM | POA: Diagnosis not present

## 2016-08-07 DIAGNOSIS — F603 Borderline personality disorder: Secondary | ICD-10-CM | POA: Diagnosis not present

## 2016-08-07 DIAGNOSIS — M1288 Other specific arthropathies, not elsewhere classified, other specified site: Secondary | ICD-10-CM | POA: Diagnosis not present

## 2016-08-07 DIAGNOSIS — F4310 Post-traumatic stress disorder, unspecified: Secondary | ICD-10-CM | POA: Diagnosis not present

## 2016-08-07 DIAGNOSIS — M791 Myalgia: Secondary | ICD-10-CM | POA: Diagnosis not present

## 2016-08-07 DIAGNOSIS — G43919 Migraine, unspecified, intractable, without status migrainosus: Secondary | ICD-10-CM | POA: Insufficient documentation

## 2016-08-07 DIAGNOSIS — G894 Chronic pain syndrome: Secondary | ICD-10-CM

## 2016-08-07 DIAGNOSIS — M961 Postlaminectomy syndrome, not elsewhere classified: Secondary | ICD-10-CM

## 2016-08-07 DIAGNOSIS — G4486 Cervicogenic headache: Secondary | ICD-10-CM

## 2016-08-07 DIAGNOSIS — M47892 Other spondylosis, cervical region: Secondary | ICD-10-CM | POA: Diagnosis not present

## 2016-08-07 DIAGNOSIS — Z79899 Other long term (current) drug therapy: Secondary | ICD-10-CM

## 2016-08-07 DIAGNOSIS — M47812 Spondylosis without myelopathy or radiculopathy, cervical region: Secondary | ICD-10-CM

## 2016-08-07 DIAGNOSIS — G43011 Migraine without aura, intractable, with status migrainosus: Secondary | ICD-10-CM

## 2016-08-07 DIAGNOSIS — Z5181 Encounter for therapeutic drug level monitoring: Secondary | ICD-10-CM

## 2016-08-07 DIAGNOSIS — G43711 Chronic migraine without aura, intractable, with status migrainosus: Secondary | ICD-10-CM

## 2016-08-07 DIAGNOSIS — R51 Headache: Secondary | ICD-10-CM

## 2016-08-07 DIAGNOSIS — M7918 Myalgia, other site: Secondary | ICD-10-CM

## 2016-08-07 MED ORDER — METHADONE HCL 10 MG PO TABS: 20.0000 mg | ORAL_TABLET | Freq: Three times a day (TID) | ORAL | 0 refills | Status: DC

## 2016-08-07 MED ORDER — MEPERIDINE HCL 50 MG PO TABS: 50.0000 mg | ORAL_TABLET | Freq: Every day | ORAL | 0 refills | Status: DC | PRN

## 2016-08-07 NOTE — Addendum Note (Signed)
Addended by: Barbee ShropshireBRIGHT, Khrystyne Arpin B on: 08/07/2016 11:33 AM   Modules accepted: Orders

## 2016-08-07 NOTE — Patient Instructions (Signed)
MEDITATION AND MINDFULLNESS TECHNIQUES   PLEASE CALL ME WITH ANY PROBLEMS OR QUESTIONS [phone])   HAPPY HOLIDAYS!!!!                    *                * *             *   *   *         *  *   *  *  *     *  *  *  *  *  *  * *  *  *  *  *  *  *  *  *  * *               *  *               *  *               *  *

## 2016-08-07 NOTE — Progress Notes (Signed)
Subjective:    Patient ID: Monica [name], female    DOB: 06/30/1951, 65 y.o.   MRN: [id number]  HPI   Monica [name] is here in follow up of her chronic pain. We tried vimpat for her headache and she developed tingling and hallucinations however with the medication and had to stop after two weeks. With cessation of the medication her symptoms subsided.   Since then she's been back to baseline. She has adopted more of a "management mode" with more realistic goals and expectations.   She has enjoyed a quiet Christmas with her family.    Pain Inventory Average Pain 6 Pain Right Now 6 My pain is constant, sharp, burning, stabbing, tingling and aching  In the last 24 hours, has pain interfered with the following? General activity 7 Relation with others 9 Enjoyment of life 10 What TIME of day is your pain at its worst? evening Sleep (in general) Poor  Pain is worse with: bending and some activites Pain improves with: rest, heat/ice, pacing activities and medication Relief from Meds: 7  Mobility walk without assistance ability to climb steps?  yes do you drive?  yes Do you have any goals in this area?  no  Function retired  Neuro/Psych No problems in this area  Prior Studies Any changes since last visit?  no  Physicians involved in your care Any changes since last visit?  no   Family History  Problem Relation Age of Onset  . Colon cancer Mother 4160   Social History   Social History  . Marital status: Married    Spouse name: N/A  . Number of children: 3  . Years of education: N/A   Occupational History  . disabled Unemployed   Social History Main Topics  . Smoking status: Never Smoker  . Smokeless tobacco: Never Used  . Alcohol use No  . Drug use: No  . Sexual activity: Yes    Birth control/ protection: Post-menopausal   Other Topics Concern  . Not on file   Social History Narrative  . No narrative on file   Past Surgical History:  Procedure Laterality  Date  . BREAST LUMPECTOMY     left  . CHOLECYSTECTOMY    . COLONOSCOPY  11/17/08   ext hemorrhoids  . CRANIOTOMY     secondary TBI  . ESOPHAGOGASTRODUODENOSCOPY  04/05/10   [name] (58F),erosive reflux esophagitis, small hiatal hernia,  . FINGER SURGERY     removal cyst left pinky  . gastro empy study  03/03/10   normal  . HAND SURGERY    . NECK SURGERY  1998  . NEPHROLITHOTOMY  05/13/2012   Procedure: NEPHROLITHOTOMY PERCUTANEOUS;  Surgeon: Marcine MatarStephen Dahlstedt, MD;  Location: [facility];  Service: Urology;  Laterality: Left;      Past Medical History:  Diagnosis Date  . Arthritis   . Cervical facet joint syndrome 04/22/2012  . Cervicalgia   . Chronic headaches    Dr Hermelinda MedicusSchwartz [place] [facility]  . Chronic nausea    normal GES   . Complication of anesthesia    had problem with local anesthesia-tried to assist Physician  . DM (diabetes mellitus) (HCC)   . Elevated liver enzymes    hx of, normal 03/2010  . Glaucoma   . Headache(784.0)   . Hemorrhoids 11/17/08   Colonoscopy Dr Karilyn Cotaehman  . Migraine without aura, with intractable migraine, so stated, without mention of status migrainosus   . Occipital neuralgia   . Posttraumatic stress disorder   .  PTSD (post-traumatic stress disorder)   . Renal lithiasis   . 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 Torticollis   . Torticollis   . Unspecified musculoskeletal disorders and symptoms referable to neck    cervical/trapezius   There were no vitals taken for this visit.  Opioid Risk Score:   Fall Risk Score:  `1  Depression screen PHQ 2/9  [facility] 04/10/2016 04/04/2016 11/06/2015 10/19/2015 10/18/2015 10/02/2015 08/31/2015  Decreased Interest 3 3 0 0 0 0 0  Down, Depressed, Hopeless 3 3 0 0 0 0 0  PHQ - 2 Score 6 6 0 0 0 0 0  Altered sleeping - 3 - - - - -  Tired, decreased energy - 3 - - - - -  Change in appetite - 3 - - - - -  Feeling bad or failure about yourself  - 0 - - - - -  Trouble concentrating -  3 - - - - -  Moving slowly or fidgety/restless - 3 - - - - -  Suicidal thoughts - 0 - - - - -  PHQ-9 Score - 21 - - - - -  Difficult doing work/chores - Not difficult at all - - - - -   Review of Systems  Constitutional: Negative.   HENT:       Headache  Eyes: Negative.   Respiratory: Negative.   Cardiovascular: Negative.   Gastrointestinal: Negative.   Endocrine: Negative.   Genitourinary: Negative.   Musculoskeletal: Negative.   Skin: Negative.   Allergic/Immunologic: Negative.   Neurological: Negative.   Hematological: Negative.   Psychiatric/Behavioral: Negative.        Objective:   Physical Exam   DTR's appear to be 1+ to 2+ throughout. Muscle testing reveals 5/5 muscle strength of the upper extremity, and 5/5 of the lower extremity. Full range of motion in upper and lower extremities. Cervical ROM remains somewhat limited. Remains tender.  Neuro: Fine motor movements are normal in both hands.  Sensory is intact and symmetric to light touch, pinprick and proprioception.  DTR in the upper and lower extremity are present and symmetric 2+. No clonus is noted.  Patient balance/stable---tandem gait normal.  M/S: pain persists with palpation over the left iliac crest. Neck rom is improved  Psych: in good spirits today but still can be labile     Assessment & Plan:   Assessment:  1. 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  2. Posttraumatic stress syndrome.  3. Greater occipital neuralgia (bilateral) confirmed by response to recent nerve block    PLAN:  1. Continue methadone rx. Continue demerol 50 mg qd prn for severe pain. Second rx's were provided for next month.   2. S/p greater occipital nerve RF with transient results 3. Continue topamax.  Consider another anticonvulstant if available.  4. Maintain cervical range of motion/posture exercises.  5. Continue k-tape,  aromatherapy, and cervical traction as able.  6. Reviewed meditation and mindfulness techniques---consider neuropsych referral in 2018; biofeedback might be useful for her. 7. 15 minutes of face to face patient care time were spent during this visit. All questions were encouraged and answered. I'll see her back in about 2 months.

## 2016-08-14 LAB — TOXASSURE SELECT,+ANTIDEPR,UR

## 2016-08-26 DIAGNOSIS — H25813 Combined forms of age-related cataract, bilateral: Secondary | ICD-10-CM | POA: Diagnosis not present

## 2016-08-26 DIAGNOSIS — E119 Type 2 diabetes mellitus without complications: Secondary | ICD-10-CM | POA: Diagnosis not present

## 2016-09-03 DIAGNOSIS — H25812 Combined forms of age-related cataract, left eye: Secondary | ICD-10-CM | POA: Diagnosis not present

## 2016-09-13 NOTE — Patient Instructions (Signed)
Your procedure is scheduled on:  09/23/2016  [facility]    [facility].  Call this number if you have problems the morning of surgery: [phone]   Do not eat food or drink liquids :After Midnight.      Take these medicines the morning of surgery with A SIP OF WATER: demerol or methadone (if needed), skelaxin, phenergan and imitrex(if needed), topamax.   Do not wear jewelry, make-up or nail polish.  Do not wear lotions, powders, or perfumes. You may wear deodorant.  Do not shave 48 hours prior to surgery.  Do not bring valuables to the hospital.  Contacts, dentures or bridgework may not be worn into surgery.  Leave suitcase in the car. After surgery it may be brought to your room.  For patients admitted to the hospital, checkout time is 11:00 AM the day of discharge.   Patients discharged the day of surgery will not be allowed to drive home.  :     Please read over the following fact sheets that you were given: Coughing and Deep Breathing, Surgical Site Infection Prevention, Anesthesia Post-op Instructions and Care and Recovery After Surgery    Cataract A cataract is a clouding of the lens of the eye. When a lens becomes cloudy, vision is reduced based on the degree and nature of the clouding. Many cataracts reduce vision to some degree. Some cataracts make people more near-sighted as they develop. Other cataracts increase glare. Cataracts that are ignored and become worse can sometimes look white. The white color can be seen through the pupil. CAUSES   Aging. However, cataracts may occur at any age, even in newborns.   Certain drugs.   Trauma to the eye.   Certain diseases such as diabetes.   Specific eye diseases such as chronic inflammation inside the eye or a sudden attack of a rare form of glaucoma.   Inherited or acquired medical problems.  SYMPTOMS   Gradual, progressive drop in vision in the affected eye.   Severe, rapid visual loss. This most often happens  when trauma is the cause.  DIAGNOSIS  To detect a cataract, an eye doctor examines the lens. Cataracts are best diagnosed with an exam of the eyes with the pupils enlarged (dilated) by drops.  TREATMENT  For an early cataract, vision may improve by using different eyeglasses or stronger lighting. If that does not help your vision, surgery is the only effective treatment. A cataract needs to be surgically removed when vision loss interferes with your everyday activities, such as driving, reading, or watching TV. A cataract may also have to be removed if it prevents examination or treatment of another eye problem. Surgery removes the cloudy lens and usually replaces it with a substitute lens (intraocular lens, IOL).  At a time when both you and your doctor agree, the cataract will be surgically removed. If you have cataracts in both eyes, only one is usually removed at a time. This allows the operated eye to heal and be out of danger from any possible problems after surgery (such as infection or poor wound healing). In rare cases, a cataract may be doing damage to your eye. In these cases, your caregiver may advise surgical removal right away. The vast majority of people who have cataract surgery have better vision afterward. HOME CARE INSTRUCTIONS  If you are not planning surgery, you may be asked to do the following:  Use different eyeglasses.   Use stronger or brighter  lighting.   Ask your eye doctor about reducing your medicine dose or changing medicines if it is thought that a medicine caused your cataract. Changing medicines does not make the cataract go away on its own.   Become familiar with your surroundings. Poor vision can lead to injury. Avoid bumping into things on the affected side. You are at a higher risk for tripping or falling.   Exercise extreme care when driving or operating machinery.   Wear sunglasses if you are sensitive to bright light or experiencing problems with glare.    SEEK IMMEDIATE MEDICAL CARE IF:   You have a worsening or sudden vision loss.   You notice redness, swelling, or increasing pain in the eye.   You have a fever.  Document Released: 07/29/2005 Document Revised: 07/18/2011 Document Reviewed: 03/22/2011 [facility] Patient Information 2012 [place].PATIENT INSTRUCTIONS POST-ANESTHESIA  IMMEDIATELY FOLLOWING SURGERY:  Do not drive or operate machinery for the first twenty four hours after surgery.  Do not make any important decisions for twenty four hours after surgery or while taking narcotic pain medications or sedatives.  If you develop intractable nausea and vomiting or a severe headache please notify your doctor immediately.  FOLLOW-UP:  Please make an appointment with your surgeon as instructed. You do not need to follow up with anesthesia unless specifically instructed to do so.  WOUND CARE INSTRUCTIONS (if applicable):  Keep a dry clean dressing on the anesthesia/puncture wound site if there is drainage.  Once the wound has quit draining you may leave it open to air.  Generally you should leave the bandage intact for twenty four hours unless there is drainage.  If the epidural site drains for more than 36-48 hours please call the anesthesia department.  QUESTIONS?:  Please feel free to call your physician or the hospital operator if you have any questions, and they will be happy to assist you.

## 2016-09-17 ENCOUNTER — Encounter (HOSPITAL_COMMUNITY): Payer: Self-pay

## 2016-09-17 ENCOUNTER — Encounter (HOSPITAL_COMMUNITY)
Admission: RE | Admit: 2016-09-17 | Discharge: 2016-09-17 | Disposition: A | Discharge status: Home / Self Care | Payer: Medicare Other | Source: Ambulatory Visit | Attending: Ophthalmology | Admitting: Ophthalmology

## 2016-09-17 DIAGNOSIS — E1322 Other specified diabetes mellitus with diabetic chronic kidney disease: Secondary | ICD-10-CM | POA: Diagnosis not present

## 2016-09-17 DIAGNOSIS — Z299 Encounter for prophylactic measures, unspecified: Secondary | ICD-10-CM | POA: Diagnosis not present

## 2016-09-17 DIAGNOSIS — R001 Bradycardia, unspecified: Secondary | ICD-10-CM | POA: Insufficient documentation

## 2016-09-17 DIAGNOSIS — Z01812 Encounter for preprocedural laboratory examination: Secondary | ICD-10-CM | POA: Insufficient documentation

## 2016-09-17 DIAGNOSIS — E1365 Other specified diabetes mellitus with hyperglycemia: Secondary | ICD-10-CM | POA: Diagnosis not present

## 2016-09-17 DIAGNOSIS — Z713 Dietary counseling and surveillance: Secondary | ICD-10-CM | POA: Diagnosis not present

## 2016-09-17 DIAGNOSIS — Z6824 Body mass index (BMI) 24.0-24.9, adult: Secondary | ICD-10-CM | POA: Diagnosis not present

## 2016-09-17 DIAGNOSIS — Z01818 Encounter for other preprocedural examination: Secondary | ICD-10-CM | POA: Diagnosis not present

## 2016-09-17 DIAGNOSIS — H2512 Age-related nuclear cataract, left eye: Secondary | ICD-10-CM | POA: Diagnosis not present

## 2016-09-17 DIAGNOSIS — R569 Unspecified convulsions: Secondary | ICD-10-CM | POA: Diagnosis not present

## 2016-09-17 DIAGNOSIS — Z789 Other specified health status: Secondary | ICD-10-CM | POA: Diagnosis not present

## 2016-09-17 DIAGNOSIS — N181 Chronic kidney disease, stage 1: Secondary | ICD-10-CM | POA: Diagnosis not present

## 2016-09-17 LAB — CBC
HEMATOCRIT: 43.2 % (ref 36.0–46.0)
HEMOGLOBIN: 15.3 g/dL — AB (ref 12.0–15.0)
MCH: 31.7 pg (ref 26.0–34.0)
MCHC: 35.4 g/dL (ref 30.0–36.0)
MCV: 89.6 fL (ref 78.0–100.0)
Platelets: 155 10*3/uL (ref 150–400)
RBC: 4.82 MIL/uL (ref 3.87–5.11)
RDW: 12.9 % (ref 11.5–15.5)
WBC: 5.2 10*3/uL (ref 4.0–10.5)

## 2016-09-17 LAB — BASIC METABOLIC PANEL
Anion gap: 8 (ref 5–15)
BUN: 18 mg/dL (ref 6–20)
CO2: 25 mmol/L (ref 22–32)
Calcium: 9.3 mg/dL (ref 8.9–10.3)
Chloride: 97 mmol/L — ABNORMAL LOW (ref 101–111)
Creatinine, Ser: 0.83 mg/dL (ref 0.44–1.00)
GFR calc non Af Amer: >60 mL/min (ref >60)
Glucose, Bld: 436 mg/dL — ABNORMAL HIGH (ref 65–99)
POTASSIUM: 3.7 mmol/L (ref 3.5–5.1)
SODIUM: 130 mmol/L — AB (ref 135–145)

## 2016-09-17 NOTE — Pre-Procedure Instructions (Signed)
Patients glucose 436.  Dr Jayme CloudGonzalez aware.  Notified Dr Alto DenverHunt, who advised that patient see PCP and cancel surgery until glucose under control.  Notified Dr Sherryll BurgerShah and lab work faxed,  States that he will manage from here.  Patient notified, as well and was urged to call to make an appointment today, per Dr Sherryll BurgerShah.

## 2016-09-19 DIAGNOSIS — Z713 Dietary counseling and surveillance: Secondary | ICD-10-CM | POA: Diagnosis not present

## 2016-09-19 DIAGNOSIS — E1365 Other specified diabetes mellitus with hyperglycemia: Secondary | ICD-10-CM | POA: Diagnosis not present

## 2016-09-19 DIAGNOSIS — Z6824 Body mass index (BMI) 24.0-24.9, adult: Secondary | ICD-10-CM | POA: Diagnosis not present

## 2016-09-19 DIAGNOSIS — N181 Chronic kidney disease, stage 1: Secondary | ICD-10-CM | POA: Diagnosis not present

## 2016-09-19 DIAGNOSIS — F329 Major depressive disorder, single episode, unspecified: Secondary | ICD-10-CM | POA: Diagnosis not present

## 2016-09-19 DIAGNOSIS — Z299 Encounter for prophylactic measures, unspecified: Secondary | ICD-10-CM | POA: Diagnosis not present

## 2016-09-19 DIAGNOSIS — E1322 Other specified diabetes mellitus with diabetic chronic kidney disease: Secondary | ICD-10-CM | POA: Diagnosis not present

## 2016-09-19 DIAGNOSIS — E7800 Pure hypercholesterolemia, unspecified: Secondary | ICD-10-CM | POA: Diagnosis not present

## 2016-09-23 ENCOUNTER — Encounter (HOSPITAL_COMMUNITY): Admission: RE | Payer: Self-pay | Source: Ambulatory Visit

## 2016-09-23 ENCOUNTER — Ambulatory Visit (HOSPITAL_COMMUNITY): Admission: RE | Admit: 2016-09-23 | Payer: Medicare Other | Source: Ambulatory Visit | Admitting: Ophthalmology

## 2016-09-23 SURGERY — PHACOEMULSIFICATION, CATARACT, WITH IOL INSERTION
Anesthesia: Monitor Anesthesia Care | Site: Eye | Laterality: Left

## 2016-10-02 ENCOUNTER — Other Ambulatory Visit (HOSPITAL_COMMUNITY): Payer: Self-pay

## 2016-10-02 NOTE — Patient Instructions (Signed)
Your procedure is scheduled on: 10/07/2016  [facility]  [facility]  [facility].  Call this number if you have problems the morning of surgery: [phone]   Do not eat food or drink liquids :After Midnight.      Take these medicines the morning of surgery with A SIP OF WATER: Demerol, or methadone, skelaxin, phenergan, imitrex, topamax.   Do not wear jewelry, make-up or nail polish.  Do not wear lotions, powders, or perfumes. You may wear deodorant.  Do not shave 48 hours prior to surgery.  Do not bring valuables to the hospital.  Contacts, dentures or bridgework may not be worn into surgery.  Leave suitcase in the car. After surgery it may be brought to your room.  For patients admitted to the hospital, checkout time is 11:00 AM the day of discharge.   Patients discharged the day of surgery will not be allowed to drive home.  :     Please read over the following fact sheets that you were given: Coughing and Deep Breathing, Surgical Site Infection Prevention, Anesthesia Post-op Instructions and Care and Recovery After Surgery    Cataract A cataract is a clouding of the lens of the eye. When a lens becomes cloudy, vision is reduced based on the degree and nature of the clouding. Many cataracts reduce vision to some degree. Some cataracts make people more near-sighted as they develop. Other cataracts increase glare. Cataracts that are ignored and become worse can sometimes look white. The white color can be seen through the pupil. CAUSES   Aging. However, cataracts may occur at any age, even in newborns.   Certain drugs.   Trauma to the eye.   Certain diseases such as diabetes.   Specific eye diseases such as chronic inflammation inside the eye or a sudden attack of a rare form of glaucoma.   Inherited or acquired medical problems.  SYMPTOMS   Gradual, progressive drop in vision in the affected eye.   Severe, rapid visual loss. This most often happens when trauma is the cause.    DIAGNOSIS  To detect a cataract, an eye doctor examines the lens. Cataracts are best diagnosed with an exam of the eyes with the pupils enlarged (dilated) by drops.  TREATMENT  For an early cataract, vision may improve by using different eyeglasses or stronger lighting. If that does not help your vision, surgery is the only effective treatment. A cataract needs to be surgically removed when vision loss interferes with your everyday activities, such as driving, reading, or watching TV. A cataract may also have to be removed if it prevents examination or treatment of another eye problem. Surgery removes the cloudy lens and usually replaces it with a substitute lens (intraocular lens, IOL).  At a time when both you and your doctor agree, the cataract will be surgically removed. If you have cataracts in both eyes, only one is usually removed at a time. This allows the operated eye to heal and be out of danger from any possible problems after surgery (such as infection or poor wound healing). In rare cases, a cataract may be doing damage to your eye. In these cases, your caregiver may advise surgical removal right away. The vast majority of people who have cataract surgery have better vision afterward. HOME CARE INSTRUCTIONS  If you are not planning surgery, you may be asked to do the following:  Use different eyeglasses.   Use stronger or brighter lighting.   Ask your eye  doctor about reducing your medicine dose or changing medicines if it is thought that a medicine caused your cataract. Changing medicines does not make the cataract go away on its own.   Become familiar with your surroundings. Poor vision can lead to injury. Avoid bumping into things on the affected side. You are at a higher risk for tripping or falling.   Exercise extreme care when driving or operating machinery.   Wear sunglasses if you are sensitive to bright light or experiencing problems with glare.  SEEK IMMEDIATE MEDICAL CARE  IF:   You have a worsening or sudden vision loss.   You notice redness, swelling, or increasing pain in the eye.   You have a fever.  Document Released: 07/29/2005 Document Revised: 07/18/2011 Document Reviewed: 03/22/2011 [facility] Patient Information 2012 [place].PATIENT INSTRUCTIONS POST-ANESTHESIA  IMMEDIATELY FOLLOWING SURGERY:  Do not drive or operate machinery for the first twenty four hours after surgery.  Do not make any important decisions for twenty four hours after surgery or while taking narcotic pain medications or sedatives.  If you develop intractable nausea and vomiting or a severe headache please notify your doctor immediately.  FOLLOW-UP:  Please make an appointment with your surgeon as instructed. You do not need to follow up with anesthesia unless specifically instructed to do so.  WOUND CARE INSTRUCTIONS (if applicable):  Keep a dry clean dressing on the anesthesia/puncture wound site if there is drainage.  Once the wound has quit draining you may leave it open to air.  Generally you should leave the bandage intact for twenty four hours unless there is drainage.  If the epidural site drains for more than 36-48 hours please call the anesthesia department.  QUESTIONS?:  Please feel free to call your physician or the hospital operator if you have any questions, and they will be happy to assist you.

## 2016-10-03 ENCOUNTER — Encounter (HOSPITAL_COMMUNITY)
Admission: RE | Admit: 2016-10-03 | Discharge: 2016-10-03 | Disposition: A | Discharge status: Home / Self Care | Payer: Medicare Other | Source: Ambulatory Visit | Attending: Ophthalmology | Admitting: Ophthalmology

## 2016-10-03 ENCOUNTER — Encounter (HOSPITAL_COMMUNITY): Payer: Self-pay

## 2016-10-03 DIAGNOSIS — Z01812 Encounter for preprocedural laboratory examination: Secondary | ICD-10-CM | POA: Insufficient documentation

## 2016-10-03 DIAGNOSIS — H2512 Age-related nuclear cataract, left eye: Secondary | ICD-10-CM | POA: Diagnosis not present

## 2016-10-03 LAB — BASIC METABOLIC PANEL
ANION GAP: 7 (ref 5–15)
BUN: 12 mg/dL (ref 6–20)
CALCIUM: 8.9 mg/dL (ref 8.9–10.3)
CHLORIDE: 104 mmol/L (ref 101–111)
CO2: 25 mmol/L (ref 22–32)
CREATININE: 0.87 mg/dL (ref 0.44–1.00)
GFR calc non Af Amer: >60 mL/min (ref >60)
Glucose, Bld: 228 mg/dL — ABNORMAL HIGH (ref 65–99)
Potassium: 4 mmol/L (ref 3.5–5.1)
SODIUM: 136 mmol/L (ref 135–145)

## 2016-10-04 ENCOUNTER — Other Ambulatory Visit: Payer: Self-pay | Admitting: Physical Medicine & Rehabilitation

## 2016-10-07 ENCOUNTER — Ambulatory Visit: Payer: Self-pay | Admitting: Physical Medicine & Rehabilitation

## 2016-10-07 ENCOUNTER — Encounter (HOSPITAL_COMMUNITY): Payer: Self-pay | Admitting: Ophthalmology

## 2016-10-07 ENCOUNTER — Encounter (HOSPITAL_COMMUNITY)
Admission: RE | Disposition: A | Discharge status: Home / Self Care | Payer: Self-pay | Source: Ambulatory Visit | Attending: Ophthalmology

## 2016-10-07 ENCOUNTER — Ambulatory Visit (HOSPITAL_COMMUNITY): Payer: Medicare Other | Admitting: Anesthesiology

## 2016-10-07 ENCOUNTER — Ambulatory Visit (HOSPITAL_COMMUNITY)
Admission: RE | Admit: 2016-10-07 | Discharge: 2016-10-07 | Disposition: A | Discharge status: Home / Self Care | Payer: Medicare Other | Source: Ambulatory Visit | Attending: Ophthalmology | Admitting: Ophthalmology

## 2016-10-07 DIAGNOSIS — F329 Major depressive disorder, single episode, unspecified: Secondary | ICD-10-CM | POA: Insufficient documentation

## 2016-10-07 DIAGNOSIS — G709 Myoneural disorder, unspecified: Secondary | ICD-10-CM | POA: Diagnosis not present

## 2016-10-07 DIAGNOSIS — R51 Headache: Secondary | ICD-10-CM | POA: Insufficient documentation

## 2016-10-07 DIAGNOSIS — Z7984 Long term (current) use of oral hypoglycemic drugs: Secondary | ICD-10-CM | POA: Diagnosis not present

## 2016-10-07 DIAGNOSIS — F4310 Post-traumatic stress disorder, unspecified: Secondary | ICD-10-CM | POA: Insufficient documentation

## 2016-10-07 DIAGNOSIS — E1136 Type 2 diabetes mellitus with diabetic cataract: Secondary | ICD-10-CM | POA: Diagnosis not present

## 2016-10-07 DIAGNOSIS — K219 Gastro-esophageal reflux disease without esophagitis: Secondary | ICD-10-CM | POA: Diagnosis not present

## 2016-10-07 DIAGNOSIS — H25812 Combined forms of age-related cataract, left eye: Secondary | ICD-10-CM | POA: Diagnosis not present

## 2016-10-07 DIAGNOSIS — Z79899 Other long term (current) drug therapy: Secondary | ICD-10-CM | POA: Insufficient documentation

## 2016-10-07 DIAGNOSIS — H269 Unspecified cataract: Secondary | ICD-10-CM | POA: Diagnosis not present

## 2016-10-07 HISTORY — PX: CATARACT EXTRACTION W/PHACO: SHX586

## 2016-10-07 LAB — GLUCOSE, CAPILLARY
GLUCOSE-CAPILLARY: 144 mg/dL — AB (ref 65–99)
Glucose-Capillary: 86 mg/dL (ref 65–99)

## 2016-10-07 SURGERY — PHACOEMULSIFICATION, CATARACT, WITH IOL INSERTION
Anesthesia: Monitor Anesthesia Care | Site: Eye | Laterality: Left

## 2016-10-07 MED ORDER — PHENYLEPHRINE HCL 2.5 % OP SOLN
1.0000 [drp] | OPHTHALMIC | Status: AC
  Administered 2016-10-07 (×3): 1 [drp] via OPHTHALMIC

## 2016-10-07 MED ORDER — LIDOCAINE HCL (PF) 1 % IJ SOLN
INTRAMUSCULAR | Status: DC | PRN
  Administered 2016-10-07: .4 mL

## 2016-10-07 MED ORDER — DEXTROSE 50 % IV SOLN
INTRAVENOUS | Status: AC
  Filled 2016-10-07: qty 50

## 2016-10-07 MED ORDER — MIDAZOLAM HCL 2 MG/2ML IJ SOLN
1.0000 mg | INTRAMUSCULAR | Status: AC
  Administered 2016-10-07: 2 mg via INTRAVENOUS

## 2016-10-07 MED ORDER — MIDAZOLAM HCL 2 MG/2ML IJ SOLN
INTRAMUSCULAR | Status: AC
  Filled 2016-10-07: qty 2

## 2016-10-07 MED ORDER — DEXTROSE 50 % IV SOLN
12.5000 g | Freq: Once | INTRAVENOUS | Status: AC
  Administered 2016-10-07: 12.5 g via INTRAVENOUS

## 2016-10-07 MED ORDER — POVIDONE-IODINE 5 % OP SOLN
OPHTHALMIC | Status: DC | PRN
  Administered 2016-10-07: 1 via OPHTHALMIC

## 2016-10-07 MED ORDER — EPINEPHRINE PF 1 MG/ML IJ SOLN
INTRAOCULAR | Status: DC | PRN
  Administered 2016-10-07: 500 mL

## 2016-10-07 MED ORDER — BSS IO SOLN
INTRAOCULAR | Status: DC | PRN
  Administered 2016-10-07: 15 mL

## 2016-10-07 MED ORDER — FENTANYL CITRATE (PF) 100 MCG/2ML IJ SOLN
INTRAMUSCULAR | Status: AC
  Filled 2016-10-07: qty 2

## 2016-10-07 MED ORDER — PROVISC 10 MG/ML IO SOLN
INTRAOCULAR | Status: DC | PRN
  Administered 2016-10-07: 0.85 mL via INTRAOCULAR

## 2016-10-07 MED ORDER — TETRACAINE HCL 0.5 % OP SOLN
1.0000 [drp] | OPHTHALMIC | Status: AC
  Administered 2016-10-07 (×3): 1 [drp] via OPHTHALMIC

## 2016-10-07 MED ORDER — FENTANYL CITRATE (PF) 100 MCG/2ML IJ SOLN
25.0000 ug | Freq: Once | INTRAMUSCULAR | Status: AC
  Administered 2016-10-07: 25 ug via INTRAVENOUS

## 2016-10-07 MED ORDER — LIDOCAINE HCL 3.5 % OP GEL
1.0000 "application " | Freq: Once | OPHTHALMIC | Status: AC
  Administered 2016-10-07: 1 via OPHTHALMIC

## 2016-10-07 MED ORDER — CYCLOPENTOLATE-PHENYLEPHRINE 0.2-1 % OP SOLN
1.0000 [drp] | OPHTHALMIC | Status: AC
  Administered 2016-10-07 (×3): 1 [drp] via OPHTHALMIC

## 2016-10-07 MED ORDER — NEOMYCIN-POLYMYXIN-DEXAMETH 3.5-10000-0.1 OP SUSP
OPHTHALMIC | Status: DC | PRN
  Administered 2016-10-07: 2 [drp] via OPHTHALMIC

## 2016-10-07 MED ORDER — LACTATED RINGERS IV SOLN
INTRAVENOUS | Status: DC
  Administered 2016-10-07: 07:00:00 via INTRAVENOUS

## 2016-10-07 SURGICAL SUPPLY — 11 items
CLOTH BEACON ORANGE TIMEOUT ST (SAFETY) ×3 IMPLANT
EYE SHIELD UNIVERSAL CLEAR (GAUZE/BANDAGES/DRESSINGS) ×3 IMPLANT
GLOVE BIOGEL PI IND STRL 6.5 (GLOVE) ×1 IMPLANT
GLOVE BIOGEL PI INDICATOR 6.5 (GLOVE) ×2
GLOVE EXAM NITRILE MD LF STRL (GLOVE) ×3 IMPLANT
PAD ARMBOARD 7.5X6 YLW CONV (MISCELLANEOUS) ×3 IMPLANT
SIGHTPATH CAT PROC W REG LENS (Ophthalmic Related) ×3 IMPLANT
SYRINGE LUER LOK 1CC (MISCELLANEOUS) ×3 IMPLANT
TAPE SURG TRANSPORE 1 IN (GAUZE/BANDAGES/DRESSINGS) ×1 IMPLANT
TAPE SURGICAL TRANSPORE 1 IN (GAUZE/BANDAGES/DRESSINGS) ×2
WATER STERILE IRR 250ML POUR (IV SOLUTION) ×3 IMPLANT

## 2016-10-07 NOTE — Discharge Instructions (Signed)

## 2016-10-07 NOTE — Anesthesia Procedure Notes (Signed)
Procedure Name: MAC Date/Time: 10/07/2016 7:53 AM Performed by: Vista Deck Pre-anesthesia Checklist: Patient identified, Emergency Drugs available, Suction available, Timeout performed and Patient being monitored Patient Re-evaluated:Patient Re-evaluated prior to inductionOxygen Delivery Method: Nasal Cannula

## 2016-10-07 NOTE — Anesthesia Preprocedure Evaluation (Signed)
Anesthesia Evaluation  Patient identified by MRN, date of birth, ID band Patient awake    Reviewed: Allergy & Precautions, H&P , NPO status , Patient's Chart, lab work & pertinent test results  Airway Mallampati: II  TM Distance: <3 FB Neck ROM: Full    Dental no notable dental hx.    Pulmonary neg pulmonary ROS, shortness of breath and with exertion,    Pulmonary exam normal breath sounds clear to auscultation       Cardiovascular negative cardio ROS Normal cardiovascular exam Rhythm:Regular Rate:Normal     Neuro/Psych  Headaches, Seizures -,  PSYCHIATRIC DISORDERS (PTSD) Depression  Neuromuscular disease negative neurological ROS  negative psych ROS   GI/Hepatic negative GI ROS, Neg liver ROS, GERD  Medicated,  Endo/Other  diabetes, Type 2, Oral Hypoglycemic Agents  Renal/GU negative Renal ROS  negative genitourinary   Musculoskeletal negative musculoskeletal ROS (+)   Abdominal   Peds negative pediatric ROS (+)  Hematology negative hematology ROS (+)   Anesthesia Other Findings   Reproductive/Obstetrics negative OB ROS                             Anesthesia Physical Anesthesia Plan  ASA: III  Anesthesia Plan: MAC   Post-op Pain Management:    Induction: Intravenous  Airway Management Planned: Nasal Cannula  Additional Equipment:   Intra-op Plan:   Post-operative Plan:   Informed Consent: I have reviewed the patients History and Physical, chart, labs and discussed the procedure including the risks, benefits and alternatives for the proposed anesthesia with the patient or authorized representative who has indicated his/her understanding and acceptance.     Plan Discussed with:   Anesthesia Plan Comments:         Anesthesia Quick Evaluation

## 2016-10-07 NOTE — Anesthesia Postprocedure Evaluation (Signed)
Anesthesia Post Note  Patient: Monica [name]  Procedure(s) Performed: Procedure(s) (LRB): CATARACT EXTRACTION PHACO AND INTRAOCULAR LENS PLACEMENT (IOC) (Left)  Patient location during evaluation: Short Stay Anesthesia Type: MAC Level of consciousness: awake and alert Pain management: pain level controlled Vital Signs Assessment: post-procedure vital signs reviewed and stable Respiratory status: spontaneous breathing Anesthetic complications: no     Last Vitals:  Vitals:   10/07/16 0750 10/07/16 0810  BP: (!) 92/50 (!) 100/43  Pulse:  (!) 44  Resp: 15 16  Temp:  36.9 C    Last Pain:  Vitals:   10/07/16 0810  TempSrc: Oral  PainSc:                  Drucie Opitz

## 2016-10-07 NOTE — H&P (Signed)
I have reviewed the H&P, the patient was re-examined, and I have identified no interval changes in medical condition and plan of care since the history and physical of record  

## 2016-10-07 NOTE — Op Note (Signed)
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  Assistants: None  Anesthesia: Topical with MAC  Indications: Painless, progressive loss of vision with compromise of daily activities.  Surgery: Cataract Extraction with Intraocular lens Implant Left Eye  Discription: The patient had dilating drops and viscous lidocaine placed into the Left eye in the pre-op holding area. After transfer to the operating room, a time out was performed. The patient was then prepped and draped. Beginning with a 36 degree blade a paracentesis port was made at the surgeon's 2 o'clock position. The anterior chamber was then filled with 1% non-preserved lidocaine. This was followed by filling the anterior chamber with Provisc.  A 2.73m keratome blade was used to make a clear corneal incision at the temporal limbus.  A bent cystatome needle was used to create a continuous tear capsulotomy. Hydrodissection was performed with balanced salt solution on a Fine canula. The lens nucleus was then removed using the phacoemulsification handpiece. Residual cortex was removed with the I&A handpiece. The anterior chamber and capsular bag were refilled with Provisc. A posterior chamber intraocular lens was placed into the capsular bag with it's injector. The implant was positioned with the Kuglan hook. The Provisc was then removed from the anterior chamber and capsular bag with the I&A handpiece. Stromal hydration of the main incision and paracentesis port was performed with BSS on a Fine canula. The wounds were tested for leak which was negative. The patient tolerated the procedure well. There were no operative complications. The patient was then transferred to the recovery room in stable condition.  Complications: None  Specimen: None  EBL: None  Prosthetic device: Abbott Technis, PCB00, power  22.5, SN [id number]

## 2016-10-07 NOTE — Transfer of Care (Signed)
Immediate Anesthesia Transfer of Care Note  Patient: Monica [name]  Procedure(s) Performed: Procedure(s) (LRB): CATARACT EXTRACTION PHACO AND INTRAOCULAR LENS PLACEMENT (IOC) (Left)  Patient Location: Shortstay  Anesthesia Type: MAC  Level of Consciousness: awake  Airway & Oxygen Therapy: Patient Spontanous Breathing   Post-op Assessment: Report given to PACU RN, Post -op Vital signs reviewed and stable and Patient moving all extremities  Post vital signs: Reviewed and stable  Complications: No apparent anesthesia complications

## 2016-10-08 ENCOUNTER — Encounter (HOSPITAL_COMMUNITY): Payer: Self-pay | Admitting: Ophthalmology

## 2016-10-08 NOTE — Addendum Note (Signed)
Addendum  created 10/08/16 0849 by Franco Noneseresa S Torres Hardenbrook, CRNA   Anesthesia Intra Flowsheets edited

## 2016-10-11 ENCOUNTER — Telehealth: Payer: Self-pay

## 2016-10-11 DIAGNOSIS — S7002XS Contusion of left hip, sequela: Secondary | ICD-10-CM

## 2016-10-11 NOTE — Telephone Encounter (Signed)
[facility], patient requesting a refill on naproxen, no mention in previous notes to continue this medication.   Please advise

## 2016-10-14 ENCOUNTER — Other Ambulatory Visit: Payer: Self-pay | Admitting: Physical Medicine & Rehabilitation

## 2016-10-14 DIAGNOSIS — H25811 Combined forms of age-related cataract, right eye: Secondary | ICD-10-CM | POA: Diagnosis not present

## 2016-10-14 DIAGNOSIS — S7002XS Contusion of left hip, sequela: Secondary | ICD-10-CM

## 2016-10-14 MED ORDER — NAPROXEN 500 MG PO TABS: ORAL_TABLET | ORAL | 5 refills | Status: DC

## 2016-10-14 NOTE — Telephone Encounter (Signed)
rx written

## 2016-10-15 ENCOUNTER — Encounter (HOSPITAL_COMMUNITY)
Admission: RE | Admit: 2016-10-15 | Discharge: 2016-10-15 | Disposition: A | Discharge status: Home / Self Care | Payer: Medicare Other | Source: Ambulatory Visit | Attending: Ophthalmology | Admitting: Ophthalmology

## 2016-10-15 ENCOUNTER — Encounter
Payer: Worker's Compensation | Attending: Physical Medicine & Rehabilitation | Admitting: Physical Medicine & Rehabilitation

## 2016-10-15 ENCOUNTER — Encounter: Payer: Self-pay | Admitting: Physical Medicine & Rehabilitation

## 2016-10-15 VITALS — BP 136/86 | HR 58 | Resp 14

## 2016-10-15 DIAGNOSIS — F603 Borderline personality disorder: Secondary | ICD-10-CM | POA: Insufficient documentation

## 2016-10-15 DIAGNOSIS — M1288 Other specific arthropathies, not elsewhere classified, other specified site: Secondary | ICD-10-CM | POA: Diagnosis not present

## 2016-10-15 DIAGNOSIS — M542 Cervicalgia: Secondary | ICD-10-CM | POA: Insufficient documentation

## 2016-10-15 DIAGNOSIS — M791 Myalgia: Secondary | ICD-10-CM | POA: Diagnosis not present

## 2016-10-15 DIAGNOSIS — M47812 Spondylosis without myelopathy or radiculopathy, cervical region: Secondary | ICD-10-CM

## 2016-10-15 DIAGNOSIS — G43919 Migraine, unspecified, intractable, without status migrainosus: Secondary | ICD-10-CM | POA: Insufficient documentation

## 2016-10-15 DIAGNOSIS — G43011 Migraine without aura, intractable, with status migrainosus: Secondary | ICD-10-CM

## 2016-10-15 DIAGNOSIS — M129 Arthropathy, unspecified: Secondary | ICD-10-CM | POA: Diagnosis not present

## 2016-10-15 DIAGNOSIS — R51 Headache: Secondary | ICD-10-CM

## 2016-10-15 DIAGNOSIS — F4310 Post-traumatic stress disorder, unspecified: Secondary | ICD-10-CM

## 2016-10-15 DIAGNOSIS — G8929 Other chronic pain: Secondary | ICD-10-CM | POA: Diagnosis not present

## 2016-10-15 DIAGNOSIS — G43711 Chronic migraine without aura, intractable, with status migrainosus: Secondary | ICD-10-CM | POA: Diagnosis not present

## 2016-10-15 DIAGNOSIS — M7918 Myalgia, other site: Secondary | ICD-10-CM

## 2016-10-15 DIAGNOSIS — Z5181 Encounter for therapeutic drug level monitoring: Secondary | ICD-10-CM

## 2016-10-15 DIAGNOSIS — G4486 Cervicogenic headache: Secondary | ICD-10-CM

## 2016-10-15 DIAGNOSIS — G894 Chronic pain syndrome: Secondary | ICD-10-CM

## 2016-10-15 MED ORDER — MEPERIDINE HCL 50 MG PO TABS: 50.0000 mg | ORAL_TABLET | Freq: Every day | ORAL | 0 refills | Status: DC | PRN

## 2016-10-15 MED ORDER — METHADONE HCL 10 MG PO TABS: 20.0000 mg | ORAL_TABLET | Freq: Three times a day (TID) | ORAL | 0 refills | Status: DC

## 2016-10-15 NOTE — Patient Instructions (Signed)
PLEASE FEEL FREE TO CALL OUR OFFICE WITH ANY PROBLEMS OR QUESTIONS ([phone])      
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
## 2016-10-17 DIAGNOSIS — F329 Major depressive disorder, single episode, unspecified: Secondary | ICD-10-CM | POA: Diagnosis not present

## 2016-10-17 DIAGNOSIS — K219 Gastro-esophageal reflux disease without esophagitis: Secondary | ICD-10-CM | POA: Diagnosis not present

## 2016-10-17 DIAGNOSIS — R569 Unspecified convulsions: Secondary | ICD-10-CM | POA: Diagnosis not present

## 2016-10-17 DIAGNOSIS — Z713 Dietary counseling and surveillance: Secondary | ICD-10-CM | POA: Diagnosis not present

## 2016-10-17 DIAGNOSIS — B3781 Candidal esophagitis: Secondary | ICD-10-CM | POA: Diagnosis not present

## 2016-10-17 DIAGNOSIS — Z299 Encounter for prophylactic measures, unspecified: Secondary | ICD-10-CM | POA: Diagnosis not present

## 2016-10-17 DIAGNOSIS — E1165 Type 2 diabetes mellitus with hyperglycemia: Secondary | ICD-10-CM | POA: Diagnosis not present

## 2016-10-17 DIAGNOSIS — M7550 Bursitis of unspecified shoulder: Secondary | ICD-10-CM | POA: Diagnosis not present

## 2016-10-18 ENCOUNTER — Encounter (HOSPITAL_COMMUNITY): Payer: Self-pay | Admitting: *Deleted

## 2016-10-19 LAB — TOXASSURE SELECT,+ANTIDEPR,UR

## 2016-10-21 ENCOUNTER — Encounter (HOSPITAL_COMMUNITY)
Admission: RE | Disposition: A | Discharge status: Home / Self Care | Payer: Self-pay | Source: Ambulatory Visit | Attending: Ophthalmology

## 2016-10-21 ENCOUNTER — Ambulatory Visit (HOSPITAL_COMMUNITY)
Admission: RE | Admit: 2016-10-21 | Discharge: 2016-10-21 | Disposition: A | Discharge status: Home / Self Care | Payer: Medicare Other | Source: Ambulatory Visit | Attending: Ophthalmology | Admitting: Ophthalmology

## 2016-10-21 ENCOUNTER — Ambulatory Visit (HOSPITAL_COMMUNITY): Payer: Medicare Other | Admitting: Anesthesiology

## 2016-10-21 ENCOUNTER — Encounter (HOSPITAL_COMMUNITY): Payer: Self-pay | Admitting: *Deleted

## 2016-10-21 DIAGNOSIS — E1136 Type 2 diabetes mellitus with diabetic cataract: Secondary | ICD-10-CM | POA: Diagnosis not present

## 2016-10-21 DIAGNOSIS — Z79899 Other long term (current) drug therapy: Secondary | ICD-10-CM | POA: Diagnosis not present

## 2016-10-21 DIAGNOSIS — F329 Major depressive disorder, single episode, unspecified: Secondary | ICD-10-CM | POA: Insufficient documentation

## 2016-10-21 DIAGNOSIS — F4310 Post-traumatic stress disorder, unspecified: Secondary | ICD-10-CM | POA: Insufficient documentation

## 2016-10-21 DIAGNOSIS — Z7984 Long term (current) use of oral hypoglycemic drugs: Secondary | ICD-10-CM | POA: Diagnosis not present

## 2016-10-21 DIAGNOSIS — G709 Myoneural disorder, unspecified: Secondary | ICD-10-CM | POA: Diagnosis not present

## 2016-10-21 DIAGNOSIS — K219 Gastro-esophageal reflux disease without esophagitis: Secondary | ICD-10-CM | POA: Insufficient documentation

## 2016-10-21 DIAGNOSIS — R51 Headache: Secondary | ICD-10-CM | POA: Diagnosis not present

## 2016-10-21 DIAGNOSIS — H25811 Combined forms of age-related cataract, right eye: Secondary | ICD-10-CM | POA: Diagnosis not present

## 2016-10-21 DIAGNOSIS — H2511 Age-related nuclear cataract, right eye: Secondary | ICD-10-CM | POA: Diagnosis not present

## 2016-10-21 HISTORY — PX: CATARACT EXTRACTION W/PHACO: SHX586

## 2016-10-21 LAB — GLUCOSE, CAPILLARY: Glucose-Capillary: 119 mg/dL — ABNORMAL HIGH (ref 65–99)

## 2016-10-21 SURGERY — PHACOEMULSIFICATION, CATARACT, WITH IOL INSERTION
Anesthesia: Monitor Anesthesia Care | Site: Eye | Laterality: Right

## 2016-10-21 MED ORDER — TETRACAINE HCL 0.5 % OP SOLN
1.0000 [drp] | OPHTHALMIC | Status: AC
  Administered 2016-10-21 (×3): 1 [drp] via OPHTHALMIC

## 2016-10-21 MED ORDER — MIDAZOLAM HCL 2 MG/2ML IJ SOLN
1.0000 mg | INTRAMUSCULAR | Status: AC
  Administered 2016-10-21 (×2): 2 mg via INTRAVENOUS
  Filled 2016-10-21: qty 2

## 2016-10-21 MED ORDER — POVIDONE-IODINE 5 % OP SOLN
OPHTHALMIC | Status: DC | PRN
  Administered 2016-10-21: 1 via OPHTHALMIC

## 2016-10-21 MED ORDER — EPINEPHRINE PF 1 MG/ML IJ SOLN
INTRAMUSCULAR | Status: DC | PRN
  Administered 2016-10-21: 500 mL

## 2016-10-21 MED ORDER — EPINEPHRINE PF 1 MG/ML IJ SOLN
INTRAMUSCULAR | Status: AC
  Filled 2016-10-21: qty 1

## 2016-10-21 MED ORDER — NEOMYCIN-POLYMYXIN-DEXAMETH 3.5-10000-0.1 OP SUSP
OPHTHALMIC | Status: DC | PRN
  Administered 2016-10-21: 2 [drp] via OPHTHALMIC

## 2016-10-21 MED ORDER — FENTANYL CITRATE (PF) 100 MCG/2ML IJ SOLN
INTRAMUSCULAR | Status: AC
  Filled 2016-10-21: qty 2

## 2016-10-21 MED ORDER — CYCLOPENTOLATE-PHENYLEPHRINE 0.2-1 % OP SOLN
1.0000 [drp] | OPHTHALMIC | Status: AC
  Administered 2016-10-21 (×3): 1 [drp] via OPHTHALMIC

## 2016-10-21 MED ORDER — FENTANYL CITRATE (PF) 100 MCG/2ML IJ SOLN
25.0000 ug | INTRAMUSCULAR | Status: AC
  Administered 2016-10-21 (×2): 25 ug via INTRAVENOUS

## 2016-10-21 MED ORDER — LIDOCAINE HCL 3.5 % OP GEL
1.0000 "application " | Freq: Once | OPHTHALMIC | Status: AC
  Administered 2016-10-21: 1 via OPHTHALMIC

## 2016-10-21 MED ORDER — PROVISC 10 MG/ML IO SOLN
INTRAOCULAR | Status: DC | PRN
  Administered 2016-10-21: 0.85 mL via INTRAOCULAR

## 2016-10-21 MED ORDER — LIDOCAINE HCL (PF) 1 % IJ SOLN
INTRAMUSCULAR | Status: DC | PRN
  Administered 2016-10-21: .6 mL

## 2016-10-21 MED ORDER — BSS IO SOLN
INTRAOCULAR | Status: DC | PRN
  Administered 2016-10-21: 15 mL via INTRAOCULAR

## 2016-10-21 MED ORDER — LACTATED RINGERS IV SOLN
INTRAVENOUS | Status: DC
  Administered 2016-10-21: 10:00:00 via INTRAVENOUS

## 2016-10-21 MED ORDER — PHENYLEPHRINE HCL 2.5 % OP SOLN
1.0000 [drp] | OPHTHALMIC | Status: AC
  Administered 2016-10-21 (×3): 1 [drp] via OPHTHALMIC

## 2016-10-21 MED ORDER — MIDAZOLAM HCL 2 MG/2ML IJ SOLN
INTRAMUSCULAR | Status: AC
  Filled 2016-10-21: qty 2

## 2016-10-21 SURGICAL SUPPLY — 22 items
CAPSULAR TENSION RING-AMO (OPHTHALMIC RELATED) IMPLANT
CLOTH BEACON ORANGE TIMEOUT ST (SAFETY) ×3 IMPLANT
EYE SHIELD UNIVERSAL CLEAR (GAUZE/BANDAGES/DRESSINGS) ×3 IMPLANT
GLOVE BIOGEL PI IND STRL 6.5 (GLOVE) ×1 IMPLANT
GLOVE BIOGEL PI IND STRL 7.0 (GLOVE) ×1 IMPLANT
GLOVE BIOGEL PI INDICATOR 6.5 (GLOVE) ×2
GLOVE BIOGEL PI INDICATOR 7.0 (GLOVE) ×2
GLOVE EXAM NITRILE LRG STRL (GLOVE) ×3 IMPLANT
GLOVE EXAM NITRILE MD LF STRL (GLOVE) ×3 IMPLANT
KIT VITRECTOMY (OPHTHALMIC RELATED) IMPLANT
PAD ARMBOARD 7.5X6 YLW CONV (MISCELLANEOUS) ×3 IMPLANT
PROC W NO LENS (INTRAOCULAR LENS)
PROC W SPEC LENS (INTRAOCULAR LENS)
PROCESS W NO LENS (INTRAOCULAR LENS) IMPLANT
PROCESS W SPEC LENS (INTRAOCULAR LENS) IMPLANT
RETRACTOR IRIS SIGHTPATH (OPHTHALMIC RELATED) IMPLANT
RING MALYGIN (MISCELLANEOUS) IMPLANT
SIGHTPATH CAT PROC W REG LENS (Ophthalmic Related) ×3 IMPLANT
SYRINGE LUER LOK 1CC (MISCELLANEOUS) ×3 IMPLANT
TAPE TRANSPARENT 1/2IN (GAUZE/BANDAGES/DRESSINGS) ×3 IMPLANT
VISCOELASTIC ADDITIONAL (OPHTHALMIC RELATED) IMPLANT
WATER STERILE IRR 250ML POUR (IV SOLUTION) ×3 IMPLANT

## 2016-10-21 NOTE — H&P (Signed)
I have reviewed the H&P, the patient was re-examined, and I have identified no interval changes in medical condition and plan of care since the history and physical of record  

## 2016-10-21 NOTE — Addendum Note (Signed)
Addendum  created 10/21/16 1137 by Earleen NewportAmy A Lavonya Hoerner, CRNA   Charge Capture section accepted

## 2016-10-21 NOTE — Anesthesia Procedure Notes (Signed)
Procedure Name: MAC Date/Time: 10/21/2016 10:47 AM Performed by: Andree Elk, AMY A Pre-anesthesia Checklist: Patient identified, Timeout performed, Emergency Drugs available, Suction available and Patient being monitored Oxygen Delivery Method: Nasal cannula

## 2016-10-21 NOTE — Discharge Instructions (Signed)

## 2016-10-21 NOTE — Op Note (Signed)
Date of Admission: 10/21/2016  Date of Surgery: 10/21/2016  Pre-Op Dx: Cataract Right  Eye  Post-Op Dx: Senile Combined Cataract  Right  Eye,  Dx Code J47.829  Surgeon: Tonny Branch, M.D.  Assistants: None  Anesthesia: Topical with MAC  Indications: Painless, progressive loss of vision with compromise of daily activities.  Surgery: Cataract Extraction with Intraocular lens Implant Right Eye  Discription: The patient had dilating drops and viscous lidocaine placed into the Right eye in the pre-op holding area. After transfer to the operating room, a time out was performed. The patient was then prepped and draped. Beginning with a 70 degree blade a paracentesis port was made at the surgeon's 2 o'clock position. The anterior chamber was then filled with 1% non-preserved lidocaine. This was followed by filling the anterior chamber with Provisc.  A 2.26m keratome blade was used to make a clear corneal incision at the temporal limbus.  A bent cystatome needle was used to create a continuous tear capsulotomy. Hydrodissection was performed with balanced salt solution on a Fine canula. The lens nucleus was then removed using the phacoemulsification handpiece. Residual cortex was removed with the I&A handpiece. The anterior chamber and capsular bag were refilled with Provisc. A posterior chamber intraocular lens was placed into the capsular bag with it's injector. The implant was positioned with the Kuglan hook. The Provisc was then removed from the anterior chamber and capsular bag with the I&A handpiece. Stromal hydration of the main incision and paracentesis port was performed with BSS on a Fine canula. The wounds were tested for leak which was negative. The patient tolerated the procedure well. There were no operative complications. The patient was then transferred to the recovery room in stable condition.  Complications: None  Specimen: None  EBL: None  Prosthetic device: Abbott Technis, PCB00,  power 21.5, SN [id number]

## 2016-10-21 NOTE — Anesthesia Preprocedure Evaluation (Signed)
Anesthesia Evaluation  Patient identified by MRN, date of birth, ID band Patient awake    Reviewed: Allergy & Precautions, H&P , NPO status , Patient's Chart, lab work & pertinent test results  Airway Mallampati: II  TM Distance: <3 FB Neck ROM: Full    Dental no notable dental hx.    Pulmonary neg pulmonary ROS, shortness of breath and with exertion,    Pulmonary exam normal breath sounds clear to auscultation       Cardiovascular negative cardio ROS Normal cardiovascular exam Rhythm:Regular Rate:Normal     Neuro/Psych  Headaches, Seizures -,  PSYCHIATRIC DISORDERS (PTSD) Depression  Neuromuscular disease negative neurological ROS  negative psych ROS   GI/Hepatic negative GI ROS, Neg liver ROS, GERD  Medicated,  Endo/Other  diabetes, Type 2, Oral Hypoglycemic Agents  Renal/GU negative Renal ROS  negative genitourinary   Musculoskeletal negative musculoskeletal ROS (+)   Abdominal   Peds negative pediatric ROS (+)  Hematology negative hematology ROS (+)   Anesthesia Other Findings   Reproductive/Obstetrics negative OB ROS                             Anesthesia Physical Anesthesia Plan  ASA: III  Anesthesia Plan: MAC   Post-op Pain Management:    Induction: Intravenous  Airway Management Planned: Nasal Cannula  Additional Equipment:   Intra-op Plan:   Post-operative Plan:   Informed Consent: I have reviewed the patients History and Physical, chart, labs and discussed the procedure including the risks, benefits and alternatives for the proposed anesthesia with the patient or authorized representative who has indicated his/her understanding and acceptance.     Plan Discussed with:   Anesthesia Plan Comments:         Anesthesia Quick Evaluation  

## 2016-10-21 NOTE — Transfer of Care (Signed)
Immediate Anesthesia Transfer of Care Note  Patient: Monica [name]  Procedure(s) Performed: Procedure(s) with comments: CATARACT EXTRACTION PHACO AND INTRAOCULAR LENS PLACEMENT RIGHT EYE CDE - 6.24 (Right) - right  Patient Location: Short Stay  Anesthesia Type:MAC  Level of Consciousness: awake, alert , oriented and patient cooperative  Airway & Oxygen Therapy: Patient Spontanous Breathing  Post-op Assessment: Report given to RN and Post -op Vital signs reviewed and stable  Post vital signs: Reviewed and stable  Last Vitals:  Vitals:   10/21/16 1006 10/21/16 1015  BP:  132/61  Resp:  (!) 55  Temp: 36.6 C     Last Pain:  Vitals:   10/21/16 1006  TempSrc: Oral      Patients Stated Pain Goal: 9 (62/83/15 1761)  Complications: No apparent anesthesia complications

## 2016-10-21 NOTE — Anesthesia Postprocedure Evaluation (Signed)
Anesthesia Post Note  Patient: Monica [name]  Procedure(s) Performed: Procedure(s) (LRB): CATARACT EXTRACTION PHACO AND INTRAOCULAR LENS PLACEMENT RIGHT EYE CDE - 6.24 (Right)  Patient location during evaluation: Short Stay Anesthesia Type: MAC Level of consciousness: awake and alert and oriented Pain management: pain level controlled Vital Signs Assessment: post-procedure vital signs reviewed and stable Respiratory status: spontaneous breathing Cardiovascular status: stable Postop Assessment: no signs of nausea or vomiting Anesthetic complications: no     Last Vitals:  Vitals:   10/21/16 1006 10/21/16 1015  BP:  132/61  Resp:  (!) 55  Temp: 36.6 C     Last Pain:  Vitals:   10/21/16 1006  TempSrc: Oral                 [name] A

## 2016-10-22 ENCOUNTER — Encounter (HOSPITAL_COMMUNITY): Payer: Self-pay | Admitting: Ophthalmology

## 2016-11-22 ENCOUNTER — Telehealth: Payer: Self-pay | Admitting: Registered Nurse

## 2016-11-22 NOTE — Telephone Encounter (Signed)
Ms. Hon had a UDS performed on 10/15/2016, Methadone consistent. Demerol missing her last fill was on 09/06/16, which makes her UDS consistent.

## 2016-12-05 ENCOUNTER — Encounter: Payer: Worker's Compensation | Attending: Psychology | Admitting: Psychology

## 2016-12-05 ENCOUNTER — Encounter: Payer: Self-pay | Admitting: Psychology

## 2016-12-05 DIAGNOSIS — F4310 Post-traumatic stress disorder, unspecified: Secondary | ICD-10-CM | POA: Diagnosis not present

## 2016-12-05 DIAGNOSIS — M791 Myalgia: Secondary | ICD-10-CM | POA: Insufficient documentation

## 2016-12-05 DIAGNOSIS — G43001 Migraine without aura, not intractable, with status migrainosus: Secondary | ICD-10-CM | POA: Diagnosis not present

## 2016-12-05 DIAGNOSIS — M531 Cervicobrachial syndrome: Secondary | ICD-10-CM | POA: Diagnosis not present

## 2016-12-05 DIAGNOSIS — M7918 Myalgia, other site: Secondary | ICD-10-CM

## 2016-12-05 DIAGNOSIS — G43011 Migraine without aura, intractable, with status migrainosus: Secondary | ICD-10-CM | POA: Diagnosis not present

## 2016-12-05 DIAGNOSIS — M4692 Unspecified inflammatory spondylopathy, cervical region: Secondary | ICD-10-CM

## 2016-12-05 DIAGNOSIS — M47812 Spondylosis without myelopathy or radiculopathy, cervical region: Secondary | ICD-10-CM
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
## 2016-12-09 ENCOUNTER — Other Ambulatory Visit: Payer: Self-pay | Admitting: Physical Medicine & Rehabilitation

## 2016-12-09 DIAGNOSIS — G894 Chronic pain syndrome: Secondary | ICD-10-CM

## 2016-12-09 DIAGNOSIS — Z79899 Other long term (current) drug therapy: Secondary | ICD-10-CM

## 2016-12-09 DIAGNOSIS — G43711 Chronic migraine without aura, intractable, with status migrainosus: Secondary | ICD-10-CM

## 2016-12-09 DIAGNOSIS — Z5181 Encounter for therapeutic drug level monitoring: Secondary | ICD-10-CM

## 2016-12-09 DIAGNOSIS — M7918 Myalgia, other site: Secondary | ICD-10-CM

## 2016-12-09 DIAGNOSIS — M47812 Spondylosis without myelopathy or radiculopathy, cervical region: Secondary | ICD-10-CM

## 2016-12-09 NOTE — Telephone Encounter (Signed)
Recieved electronic medication refill request for metaxalone, no mention in previous notes as to continue this medication, please advise

## 2016-12-16 ENCOUNTER — Encounter
Payer: Worker's Compensation | Attending: Physical Medicine & Rehabilitation | Admitting: Physical Medicine & Rehabilitation

## 2016-12-16 ENCOUNTER — Encounter: Payer: Self-pay | Admitting: Physical Medicine & Rehabilitation

## 2016-12-16 DIAGNOSIS — M4692 Unspecified inflammatory spondylopathy, cervical region: Secondary | ICD-10-CM | POA: Insufficient documentation

## 2016-12-16 DIAGNOSIS — R51 Headache: Secondary | ICD-10-CM | POA: Diagnosis present

## 2016-12-16 DIAGNOSIS — G894 Chronic pain syndrome: Secondary | ICD-10-CM | POA: Insufficient documentation

## 2016-12-16 DIAGNOSIS — Z79899 Other long term (current) drug therapy: Secondary | ICD-10-CM | POA: Diagnosis present

## 2016-12-16 DIAGNOSIS — M791 Myalgia: Secondary | ICD-10-CM | POA: Insufficient documentation

## 2016-12-16 DIAGNOSIS — Z5181 Encounter for therapeutic drug level monitoring: Secondary | ICD-10-CM | POA: Insufficient documentation

## 2016-12-16 DIAGNOSIS — M47812 Spondylosis without myelopathy or radiculopathy, cervical region: Secondary | ICD-10-CM

## 2016-12-16 DIAGNOSIS — G43011 Migraine without aura, intractable, with status migrainosus: Secondary | ICD-10-CM | POA: Diagnosis present

## 2016-12-16 DIAGNOSIS — G4486 Cervicogenic headache: Secondary | ICD-10-CM

## 2016-12-16 DIAGNOSIS — G43019 Migraine without aura, intractable, without status migrainosus: Secondary | ICD-10-CM | POA: Diagnosis not present

## 2016-12-16 DIAGNOSIS — G43711 Chronic migraine without aura, intractable, with status migrainosus: Secondary | ICD-10-CM | POA: Diagnosis not present

## 2016-12-16 DIAGNOSIS — M7918 Myalgia, other site: Secondary | ICD-10-CM

## 2016-12-16 MED ORDER — MEPERIDINE HCL 50 MG PO TABS: 50.0000 mg | ORAL_TABLET | Freq: Every day | ORAL | 0 refills | Status: DC | PRN

## 2016-12-16 MED ORDER — METAXALONE 800 MG PO TABS: 800.0000 mg | ORAL_TABLET | Freq: Four times a day (QID) | ORAL | 4 refills | Status: DC

## 2016-12-16 MED ORDER — METHADONE HCL 10 MG PO TABS: 20.0000 mg | ORAL_TABLET | Freq: Three times a day (TID) | ORAL | 0 refills | Status: DC

## 2016-12-16 MED ORDER — MEPERIDINE HCL 50 MG PO TABS
50.0000 mg | ORAL_TABLET | Freq: Every day | ORAL | 0 refills | Status: DC | PRN
Start: 2016-12-16 — End: 2016-12-16

## 2016-12-16 NOTE — Patient Instructions (Signed)
PLEASE FEEL FREE TO CALL OUR OFFICE WITH ANY PROBLEMS OR QUESTIONS ([phone])      

## 2016-12-16 NOTE — Progress Notes (Signed)
Subjective:    Patient ID: Monica [name], female    DOB: 1951-05-23, 66 y.o.   MRN: [id number]  HPI   Monica [name] is here in follow up of her chronic pain. She had a fall last week when she was trying to pull a shrub from the ground. She suffered a few scrapes but fortunately nothing much more although she feels her head has been "pounding" a bit more.  She remains on methadone for her baseline pain control with demerol for breakthrough pain.   Jasleen remains active physcially as she can. She better understands her limits.       Pain Inventory Average Pain 9 Pain Right Now 7 My pain is constant, sharp, burning, stabbing, tingling and aching  In the last 24 hours, has pain interfered with the following? General activity no selection Relation with others no selection Enjoyment of life no selection What TIME of day is your pain at its worst? morning Sleep (in general) Poor  Pain is worse with: walking, bending and some activites Pain improves with: rest, heat/ice, pacing activities and medication Relief from Meds: 6  Mobility walk without assistance Do you have any goals in this area?  no  Function Do you have any goals in this area?  no  Neuro/Psych dizziness  Prior Studies Any changes since last visit?  no  Physicians involved in your care Any changes since last visit?  no   Family History  Problem Relation Age of Onset  . Colon cancer Mother 21   Social History   Social History  . Marital status: Married    Spouse name: N/A  . Number of children: 3  . Years of education: N/A   Occupational History  . disabled Unemployed   Social History Main Topics  . Smoking status: Never Smoker  . Smokeless tobacco: Never Used  . Alcohol use No  . Drug use: No  . Sexual activity: Yes    Birth control/ protection: Post-menopausal   Other Topics Concern  . None   Social History Narrative  . None   Past Surgical History:  Procedure Laterality Date  . BREAST  LUMPECTOMY     left  . CATARACT EXTRACTION W/PHACO Left 10/07/2016   Procedure: CATARACT EXTRACTION PHACO AND INTRAOCULAR LENS PLACEMENT (IOC);  Surgeon: Monica [name];  Location: [facility];  Service: Ophthalmology;  Laterality: Left;  CDE: 4.98  . CATARACT EXTRACTION W/PHACO Right 10/21/2016   Procedure: CATARACT EXTRACTION PHACO AND INTRAOCULAR LENS PLACEMENT RIGHT EYE CDE - 6.24;  Surgeon: Monica [name];  Location: [facility];  Service: Ophthalmology;  Laterality: Right;  right  . CHOLECYSTECTOMY    . COLONOSCOPY  11/17/08   ext hemorrhoids  . CRANIOTOMY     secondary TBI  . ESOPHAGOGASTRODUODENOSCOPY  04/05/10   Monica [name] (70F),erosive reflux esophagitis, small hiatal hernia,  . FINGER SURGERY     removal cyst left pinky  . gastro empy study  03/03/10   normal  . HAND SURGERY    . NECK SURGERY  1998  . NEPHROLITHOTOMY  05/13/2012   Procedure: NEPHROLITHOTOMY PERCUTANEOUS;  Surgeon: Monica [name];  Location: [facility];  Service: Urology;  Laterality: Left;      Past Medical History:  Diagnosis Date  . Arthritis   . Cervical facet joint syndrome (HCC) 04/22/2012  . Cervicalgia   . Chronic headaches    Dr Hermelinda Medicus [place] Pain Specialist  . Chronic nausea    normal GES   . Complication of  anesthesia    had problem with local anesthesia-tried to assist Physician  . DM (diabetes mellitus) (HCC)   . Elevated liver enzymes    hx of, normal 03/2010  . Glaucoma   . Headache(784.0)   . Hemorrhoids 11/17/08   Colonoscopy Dr Monica [name]  . Migraine without aura, with intractable migraine, so stated, without mention of status migrainosus   . Occipital neuralgia   . Posttraumatic stress disorder   . PTSD (post-traumatic stress disorder)   . Renal lithiasis   . 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 Torticollis   . Torticollis   . Unspecified musculoskeletal disorders and symptoms referable to neck    cervical/trapezius   There were no vitals taken for this visit.  Opioid Risk  Score:   Fall Risk Score:  `1  Depression screen PHQ 2/9  [facility] 10/15/2016 04/10/2016 04/04/2016 11/06/2015 10/19/2015 10/18/2015 10/02/2015  Decreased Interest 3 3 3  0 0 0 0  Down, Depressed, Hopeless 3 3 3  0 0 0 0  PHQ - 2 Score 6 6 6  0 0 0 0  Altered sleeping - - 3 - - - -  Tired, decreased energy - - 3 - - - -  Change in appetite - - 3 - - - -  Feeling bad or failure about yourself  - - 0 - - - -  Trouble concentrating - - 3 - - - -  Moving slowly or fidgety/restless - - 3 - - - -  Suicidal thoughts - - 0 - - - -  PHQ-9 Score - - 21 - - - -  Difficult doing work/chores - - Not difficult at all - - - -    Review of Systems  Constitutional: Negative.   HENT: Negative.   Eyes: Negative.   Respiratory: Negative.   Cardiovascular: Negative.   Endocrine: Negative.   Genitourinary: Negative.   Musculoskeletal: Positive for neck pain and neck stiffness.  Skin: Negative.   Allergic/Immunologic: Negative.   Neurological: Positive for dizziness and headaches.  Hematological: Negative.   Psychiatric/Behavioral: Negative.   All other systems reviewed and are negative.      Objective:   Physical Exam Card:  RRR Chest: CTA B.  Tight with cervical ROM.  Motor 5/5.  Neuro: Fine motor movements are normal in both hands. Cognitively appropriate Sensory exam.   DTR are 2+.  No clonus is noted.  Patient balance stable/good Skin: abrasions on right left leg Psych: pleasant and appropriate   Assessment & Plan:   Assessment:  1. 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  2. Posttraumatic stress syndrome.  3. Greater occipital neuralgia (bilateral) confirmed by response to recent nerve block  4. DM 2- now on insulin   PLAN:  1. Continue methadone rx. Continue demerol 50 mg qd prn for severe pain. Second rx's were providedfor next month.  We will continue the opioid  monitoring program, this consists of regular clinic visits, examinations, urine drug screen, pill counts as well as use of [place]. [place].    2. S/p greater occipital nerve RF with transient effects.  3. Continue topamax.  .  4. Maintain cervical range motion/posture exercises.  5. Continue k-tape, aromatherapy, and cervical traction as she is able to do.   6. Continue with  Dr. Kieth Brightlyodenbough for counseling and establishment of coping skills as it pertains to her  pain. She became emotional discussing family issues today.  7. Fifteen minutes of face to face patient care time were spent during this visit. All questions were encouraged and answered. I'll see her back in about 2 months. Greater than 50% of time during this encounter was spent counseling patient/family in regard to pain mgt strategies, coping skills.

## 2016-12-18 DIAGNOSIS — F329 Major depressive disorder, single episode, unspecified: Secondary | ICD-10-CM | POA: Diagnosis not present

## 2016-12-18 DIAGNOSIS — Z713 Dietary counseling and surveillance: Secondary | ICD-10-CM | POA: Diagnosis not present

## 2016-12-18 DIAGNOSIS — Z299 Encounter for prophylactic measures, unspecified: Secondary | ICD-10-CM | POA: Diagnosis not present

## 2016-12-18 DIAGNOSIS — K760 Fatty (change of) liver, not elsewhere classified: Secondary | ICD-10-CM | POA: Diagnosis not present

## 2016-12-18 DIAGNOSIS — Z6825 Body mass index (BMI) 25.0-25.9, adult: Secondary | ICD-10-CM | POA: Diagnosis not present

## 2016-12-18 DIAGNOSIS — E1165 Type 2 diabetes mellitus with hyperglycemia: Secondary | ICD-10-CM | POA: Diagnosis not present

## 2016-12-18 DIAGNOSIS — Z1389 Encounter for screening for other disorder: Secondary | ICD-10-CM | POA: Diagnosis not present

## 2016-12-18 DIAGNOSIS — E7800 Pure hypercholesterolemia, unspecified: Secondary | ICD-10-CM | POA: Diagnosis not present

## 2016-12-18 DIAGNOSIS — K219 Gastro-esophageal reflux disease without esophagitis: Secondary | ICD-10-CM | POA: Diagnosis not present

## 2016-12-18 DIAGNOSIS — R569 Unspecified convulsions: Secondary | ICD-10-CM | POA: Diagnosis not present

## 2016-12-30 ENCOUNTER — Encounter: Payer: Worker's Compensation | Attending: Psychology | Admitting: Psychology

## 2016-12-30 DIAGNOSIS — M791 Myalgia: Secondary | ICD-10-CM | POA: Diagnosis not present

## 2016-12-30 DIAGNOSIS — G43019 Migraine without aura, intractable, without status migrainosus: Secondary | ICD-10-CM

## 2016-12-30 DIAGNOSIS — F4310 Post-traumatic stress disorder, unspecified: Secondary | ICD-10-CM | POA: Diagnosis not present

## 2016-12-30 DIAGNOSIS — M7918 Myalgia, other site: Secondary | ICD-10-CM

## 2016-12-30 DIAGNOSIS — G894 Chronic pain syndrome: Secondary | ICD-10-CM

## 2017-01-09 ENCOUNTER — Encounter: Payer: Self-pay | Admitting: Psychology

## 2017-01-09 NOTE — Progress Notes (Signed)
Patient:  [name]   DOB: 05-15-1951  MR Number: [id number]  [facility] [address], [place] [address] [id number] [place] [address] Dept: [phone]  Start: 2 PM End: 3 PM  Provider/Observer:     Hershal CoriaJohn R Rodenbough PSYD  Chief Complaint:      Chief Complaint  Patient presents with  . Pain  . Anxiety    Reason For Service:     The patient has been dealing with severe headaches that are migrainous in nature as well as cervical facet syndrome. The patient also has a lot of stress in issues. I saw the patient back in 2012 because of depression anxiety and family stress. The patient has been followed by Dr. Riley KillSwartz fear for her headaches and pain symptoms. The patient describes moderate to significant symptoms of depression, anxiety, mood changes, appetite disturbance, sleep disturbance, visual and auditory disturbance during the day which is likely related to running sleep deprivation, insomnia, marital stress and excessive worry. The patient reports that she constantly worries about her family and is always worried about whether issues within the family or her fault with errors. She reports that these difficulties have been going on for at least 19 years. She reports that she tries not to let it affect all aspects of her life but she can't sing tries to figure out why issues are happening.  Interventions Strategy:  Cognitive/behavioral psychotherapeutic interventions as well as other coping skills and strategies around severe recurrent migrainous headaches and chronic pain symptoms.  Participation Level:   Active  Participation Quality:  Appropriate and Attentive      Behavioral Observation:  Well Groomed, Lethargic, and Appropriate.   Current Psychosocial Factors: The patient reports that she is still struggling with severe pain and migraines. She reports that she is very  frustrated by her chronic pain symptoms and the inability to treat him. They have severely limited any social activity that is not a necessity.  Content of Session:   Reviewed symptoms and worked on further building of coping skills and strategies around her chronic pain and coping difficulties.  Current Status:   The patient continues to experience a lot of stress and anxiety which is triggered by an associated with her chronic pain symptoms.  Patient Progress:   Stable  Target Goals:   Target goals include reducing the frequency, intensity, and duration of her headache activity as well as better managing her other stressors.  Last Reviewed:   12/28/2016  Impression/Diagnosis:   The patient is a 66 year old female who has a long history of severe intractable migraine headaches and other orthopedic issues creating chronic pain syndromes difficulties. She also has had a lot of difficulties with anxiety and depression through the years and some significant psychosocial stressors.  Diagnosis:   Intractable migraine without aura and without status migrainosus  Myofascial muscle pain  PTSD (post-traumatic stress disorder)  Chronic pain syndrome

## 2017-01-13 ENCOUNTER — Other Ambulatory Visit: Payer: Self-pay | Admitting: Physical Medicine & Rehabilitation

## 2017-01-13 DIAGNOSIS — M7918 Myalgia, other site: Secondary | ICD-10-CM

## 2017-01-13 DIAGNOSIS — Z5181 Encounter for therapeutic drug level monitoring: Secondary | ICD-10-CM

## 2017-01-13 DIAGNOSIS — M47812 Spondylosis without myelopathy or radiculopathy, cervical region: Secondary | ICD-10-CM

## 2017-01-13 DIAGNOSIS — R51 Headache: Secondary | ICD-10-CM

## 2017-01-13 DIAGNOSIS — G43011 Migraine without aura, intractable, with status migrainosus: Secondary | ICD-10-CM

## 2017-01-13 DIAGNOSIS — G894 Chronic pain syndrome: Secondary | ICD-10-CM

## 2017-01-13 DIAGNOSIS — Z79891 Long term (current) use of opiate analgesic: Secondary | ICD-10-CM

## 2017-01-13 DIAGNOSIS — Z79899 Other long term (current) drug therapy: Secondary | ICD-10-CM

## 2017-01-13 DIAGNOSIS — G4486 Cervicogenic headache: Secondary | ICD-10-CM

## 2017-01-13 DIAGNOSIS — G43711 Chronic migraine without aura, intractable, with status migrainosus: Secondary | ICD-10-CM

## 2017-01-22 ENCOUNTER — Encounter: Payer: Self-pay | Attending: Psychology | Admitting: Psychology

## 2017-01-22 DIAGNOSIS — M791 Myalgia: Secondary | ICD-10-CM | POA: Insufficient documentation

## 2017-01-22 DIAGNOSIS — F4310 Post-traumatic stress disorder, unspecified: Secondary | ICD-10-CM | POA: Insufficient documentation

## 2017-01-22 DIAGNOSIS — M7918 Myalgia, other site: Secondary | ICD-10-CM

## 2017-01-22 DIAGNOSIS — F419 Anxiety disorder, unspecified: Secondary | ICD-10-CM | POA: Insufficient documentation

## 2017-01-22 DIAGNOSIS — G43019 Migraine without aura, intractable, without status migrainosus: Secondary | ICD-10-CM | POA: Insufficient documentation

## 2017-01-22 DIAGNOSIS — F329 Major depressive disorder, single episode, unspecified: Secondary | ICD-10-CM | POA: Insufficient documentation

## 2017-01-22 DIAGNOSIS — G894 Chronic pain syndrome: Secondary | ICD-10-CM | POA: Insufficient documentation

## 2017-02-13 ENCOUNTER — Encounter: Payer: Self-pay | Admitting: Psychology

## 2017-02-13 NOTE — Progress Notes (Signed)
Patient:  Dereck LeepStacia L Moorehouse   DOB: 05/25/1951  MR Number: [id number]  [facility] [address], [place] [address] [id number] [place] [address] Dept: [phone]  Start: 1 PM End: 2 PM  Provider/Observer:     Hershal CoriaJohn R Rodenbough PSYD  Chief Complaint:      Chief Complaint  Patient presents with  . Anxiety  . Depression  . Pain  . Post-Traumatic Stress Disorder    Reason For Service:     The patient has been dealing with severe headaches that are migrainous in nature as well as cervical facet syndrome. The patient also has a lot of stress in issues. I saw the patient back in 2012 because of depression anxiety and family stress. The patient has been followed by Dr. Riley KillSwartz fear for her headaches and pain symptoms. The patient describes moderate to significant symptoms of depression, anxiety, mood changes, appetite disturbance, sleep disturbance, visual and auditory disturbance during the day which is likely related to running sleep deprivation, insomnia, marital stress and excessive worry. The patient reports that she constantly worries about her family and is always worried about whether issues within the family or her fault with errors. She reports that these difficulties have been going on for at least 19 years. She reports that she tries not to let it affect all aspects of her life but she can't sing tries to figure out why issues are happening.  Interventions Strategy:  Cognitive/behavioral psychotherapeutic interventions as well as other coping skills and strategies around severe recurrent migrainous headaches and chronic pain symptoms.  Participation Level:   Active  Participation Quality:  Appropriate and Attentive      Behavioral Observation:  Well Groomed, Lethargic, and Appropriate.   Current Psychosocial Factors: The patient reports that she continues to struggle with  severe HA and reduced functioning as a result.  She has had trouble engaging in life and that she feels family and friends don't want to acknowledge her struggles and issues, which create even more stress and frustration.    Content of Session:   Reviewed symptoms and worked on further building of coping skills and strategies around her chronic pain and coping difficulties.  Current Status:   The patient continues to experience a lot of stress and anxiety which is triggered by an associated with her chronic pain symptoms.  Patient Progress:   Stable  Target Goals:   Target goals include reducing the frequency, intensity, and duration of her headache activity as well as better managing her other stressors.  Last Reviewed:   01/22/2017  Impression/Diagnosis:   The patient is a 66 year old female who has a long history of severe intractable migraine headaches and other orthopedic issues creating chronic pain syndromes difficulties. She also has had a lot of difficulties with anxiety and depression through the years and some significant psychosocial stressors.  Diagnosis:   Intractable migraine without aura and without status migrainosus  Myofascial muscle pain  PTSD (post-traumatic stress disorder)  Chronic pain syndrome

## 2017-02-15 ENCOUNTER — Other Ambulatory Visit: Payer: Self-pay | Admitting: Physical Medicine & Rehabilitation

## 2017-02-15 DIAGNOSIS — M7918 Myalgia, other site: Secondary | ICD-10-CM

## 2017-02-15 DIAGNOSIS — G43011 Migraine without aura, intractable, with status migrainosus: Secondary | ICD-10-CM

## 2017-02-15 DIAGNOSIS — Z5181 Encounter for therapeutic drug level monitoring: Secondary | ICD-10-CM

## 2017-02-15 DIAGNOSIS — Z79899 Other long term (current) drug therapy: Secondary | ICD-10-CM

## 2017-02-15 DIAGNOSIS — R51 Headache: Secondary | ICD-10-CM

## 2017-02-15 DIAGNOSIS — G4486 Cervicogenic headache: Secondary | ICD-10-CM

## 2017-02-15 DIAGNOSIS — M47812 Spondylosis without myelopathy or radiculopathy, cervical region: Secondary | ICD-10-CM

## 2017-02-15 DIAGNOSIS — Z79891 Long term (current) use of opiate analgesic: Secondary | ICD-10-CM

## 2017-02-15 DIAGNOSIS — G43711 Chronic migraine without aura, intractable, with status migrainosus: Secondary | ICD-10-CM

## 2017-02-15 DIAGNOSIS — G894 Chronic pain syndrome: Secondary | ICD-10-CM

## 2017-02-17 ENCOUNTER — Encounter
Payer: Worker's Compensation | Attending: Physical Medicine & Rehabilitation | Admitting: Physical Medicine & Rehabilitation

## 2017-02-17 ENCOUNTER — Encounter: Payer: Self-pay | Admitting: Physical Medicine & Rehabilitation

## 2017-02-17 VITALS — BP 171/100 | HR 56

## 2017-02-17 DIAGNOSIS — E119 Type 2 diabetes mellitus without complications: Secondary | ICD-10-CM | POA: Diagnosis not present

## 2017-02-17 DIAGNOSIS — M542 Cervicalgia: Secondary | ICD-10-CM | POA: Diagnosis not present

## 2017-02-17 DIAGNOSIS — G43919 Migraine, unspecified, intractable, without status migrainosus: Secondary | ICD-10-CM | POA: Insufficient documentation

## 2017-02-17 DIAGNOSIS — R51 Headache: Secondary | ICD-10-CM

## 2017-02-17 DIAGNOSIS — Z794 Long term (current) use of insulin: Secondary | ICD-10-CM | POA: Insufficient documentation

## 2017-02-17 DIAGNOSIS — G8929 Other chronic pain: Secondary | ICD-10-CM | POA: Insufficient documentation

## 2017-02-17 DIAGNOSIS — Z9841 Cataract extraction status, right eye: Secondary | ICD-10-CM | POA: Diagnosis not present

## 2017-02-17 DIAGNOSIS — Z5181 Encounter for therapeutic drug level monitoring: Secondary | ICD-10-CM | POA: Diagnosis not present

## 2017-02-17 DIAGNOSIS — F4310 Post-traumatic stress disorder, unspecified: Secondary | ICD-10-CM | POA: Diagnosis not present

## 2017-02-17 DIAGNOSIS — M5481 Occipital neuralgia: Secondary | ICD-10-CM | POA: Insufficient documentation

## 2017-02-17 DIAGNOSIS — M4692 Unspecified inflammatory spondylopathy, cervical region: Secondary | ICD-10-CM | POA: Diagnosis not present

## 2017-02-17 DIAGNOSIS — Z961 Presence of intraocular lens: Secondary | ICD-10-CM | POA: Insufficient documentation

## 2017-02-17 DIAGNOSIS — Z87442 Personal history of urinary calculi: Secondary | ICD-10-CM | POA: Diagnosis not present

## 2017-02-17 DIAGNOSIS — M791 Myalgia: Secondary | ICD-10-CM

## 2017-02-17 DIAGNOSIS — Z800 Family history of malignant neoplasm of digestive organs: Secondary | ICD-10-CM | POA: Diagnosis not present

## 2017-02-17 DIAGNOSIS — Z79899 Other long term (current) drug therapy: Secondary | ICD-10-CM

## 2017-02-17 DIAGNOSIS — H409 Unspecified glaucoma: Secondary | ICD-10-CM | POA: Diagnosis not present

## 2017-02-17 DIAGNOSIS — Z9842 Cataract extraction status, left eye: Secondary | ICD-10-CM | POA: Insufficient documentation

## 2017-02-17 DIAGNOSIS — G4486 Cervicogenic headache: Secondary | ICD-10-CM

## 2017-02-17 DIAGNOSIS — M1288 Other specific arthropathies, not elsewhere classified, other specified site: Secondary | ICD-10-CM | POA: Insufficient documentation

## 2017-02-17 DIAGNOSIS — M7918 Myalgia, other site: Secondary | ICD-10-CM

## 2017-02-17 DIAGNOSIS — M47812 Spondylosis without myelopathy or radiculopathy, cervical region: Secondary | ICD-10-CM

## 2017-02-17 DIAGNOSIS — G894 Chronic pain syndrome: Secondary | ICD-10-CM | POA: Diagnosis not present

## 2017-02-17 DIAGNOSIS — G43011 Migraine without aura, intractable, with status migrainosus: Secondary | ICD-10-CM

## 2017-02-17 DIAGNOSIS — G43711 Chronic migraine without aura, intractable, with status migrainosus: Secondary | ICD-10-CM

## 2017-02-17 MED ORDER — METHADONE HCL 10 MG PO TABS: 20.0000 mg | ORAL_TABLET | Freq: Three times a day (TID) | ORAL | 0 refills | Status: DC

## 2017-02-17 MED ORDER — MEPERIDINE HCL 50 MG PO TABS: 50.0000 mg | ORAL_TABLET | Freq: Every day | ORAL | 0 refills | Status: DC | PRN

## 2017-02-17 NOTE — Patient Instructions (Signed)
CONTINUE TO WORK ON YOUR COPING SKILLS. YOU'RE DOING A GREAT JOB!!

## 2017-02-17 NOTE — Progress Notes (Signed)
Subjective:    Patient ID: Monica [name], female    DOB: [birth year], 66 y.o.   MRN: [id number]  HPI   Monica [name] is here in follow up of her chronic pain. She has found the sessions with Dr. Kieth Brightly have been very helpful. She has developed better coping skills for both pain and with her psychosocial situations.   She is having a more difficult day today with some of the recent temperature changes.   Monica [name] continues on methadone for baseline pain control. She uses demerol for breakthrough pain.   Her sleep is an issue she attributes to her dogs which "like to stay up at night."  Pain Inventory Average Pain 10 Pain Right Now 10 My pain is constant, sharp, burning, stabbing and aching  In the last 24 hours, has pain interfered with the following? General activity 9 Relation with others 10 Enjoyment of life 10 What TIME of day is your pain at its worst? daytime Sleep (in general) Fair  Pain is worse with: walking, bending and some activites Pain improves with: heat/ice, pacing activities and medication Relief from Meds: 7  Mobility walk without assistance  Function retired  Neuro/Psych No problems in this area  Prior Studies Any changes since last visit?  no  Physicians involved in your care Any changes since last visit?  no   Family History  Problem Relation Age of Onset  . Colon cancer Mother 36   Social History   Social History  . Marital status: Married    Spouse name: N/A  . Number of children: 3  . Years of education: N/A   Occupational History  . disabled Unemployed   Social History Main Topics  . Smoking status: Never Smoker  . Smokeless tobacco: Never Used  . Alcohol use No  . Drug use: No  . Sexual activity: Yes    Birth control/ protection: Post-menopausal   Other Topics Concern  . None   Social History Narrative  . None   Past Surgical History:  Procedure Laterality Date  . BREAST LUMPECTOMY     left  . CATARACT EXTRACTION  W/PHACO Left 10/07/2016   Procedure: CATARACT EXTRACTION PHACO AND INTRAOCULAR LENS PLACEMENT (IOC);  Surgeon: Gemma Payor, MD;  Location: [facility];  Service: Ophthalmology;  Laterality: Left;  CDE: 4.98  . CATARACT EXTRACTION W/PHACO Right 10/21/2016   Procedure: CATARACT EXTRACTION PHACO AND INTRAOCULAR LENS PLACEMENT RIGHT EYE CDE - 6.24;  Surgeon: Gemma Payor, MD;  Location: [facility];  Service: Ophthalmology;  Laterality: Right;  right  . CHOLECYSTECTOMY    . COLONOSCOPY  11/17/08   ext hemorrhoids  . CRANIOTOMY     secondary TBI  . ESOPHAGOGASTRODUODENOSCOPY  04/05/10   [name] (4F),erosive reflux esophagitis, small hiatal hernia,  . FINGER SURGERY     removal cyst left pinky  . gastro empy study  03/03/10   normal  . HAND SURGERY    . NECK SURGERY  1998  . NEPHROLITHOTOMY  05/13/2012   Procedure: NEPHROLITHOTOMY PERCUTANEOUS;  Surgeon: Marcine Matar, MD;  Location: [facility];  Service: Urology;  Laterality: Left;      Past Medical History:  Diagnosis Date  . Arthritis   . Cervical facet joint syndrome (HCC) 04/22/2012  . Cervicalgia   . Chronic headaches    Dr Hermelinda Medicus [place] Pain Specialist  . Chronic nausea    normal GES   . Complication of anesthesia    had problem with local anesthesia-tried to assist Physician  .  DM (diabetes mellitus) (HCC)   . Elevated liver enzymes    hx of, normal 03/2010  . Glaucoma   . Headache(784.0)   . Hemorrhoids 11/17/08   Colonoscopy Dr Karilyn Cota  . Migraine without aura, with intractable migraine, so stated, without mention of status migrainosus   . Occipital neuralgia   . Posttraumatic stress disorder   . PTSD (post-traumatic stress disorder)   . Renal lithiasis   . 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 Torticollis   . Torticollis   . Unspecified musculoskeletal disorders and symptoms referable to neck    cervical/trapezius   BP (!) 171/100   Pulse (!) 56   SpO2 97%   Opioid Risk Score:   Fall Risk Score:  `1  Depression  screen PHQ 2/9  Depression screen [facility] 2/9 10/15/2016 04/10/2016 04/04/2016 11/06/2015 10/19/2015 10/18/2015 10/02/2015  Decreased Interest 3 3 3  0 0 0 0  Down, Depressed, Hopeless 3 3 3  0 0 0 0  PHQ - 2 Score 6 6 6  0 0 0 0  Altered sleeping - - 3 - - - -  Tired, decreased energy - - 3 - - - -  Change in appetite - - 3 - - - -  Feeling bad or failure about yourself  - - 0 - - - -  Trouble concentrating - - 3 - - - -  Moving slowly or fidgety/restless - - 3 - - - -  Suicidal thoughts - - 0 - - - -  PHQ-9 Score - - 21 - - - -  Difficult doing work/chores - - Not difficult at all - - - -  Some recent data might be hidden     Review of Systems  Constitutional: Negative.   HENT: Negative.   Eyes: Negative.   Respiratory: Negative.   Cardiovascular: Negative.   Gastrointestinal: Negative.   Endocrine: Negative.   Genitourinary: Negative.   Musculoskeletal: Negative.   Skin: Negative.   Allergic/Immunologic: Negative.   Neurological: Negative.   Hematological: Negative.   Psychiatric/Behavioral: Negative.   All other systems reviewed and are negative.      Objective:   Physical Exam Card:  RRR Chest: CTA B.  Decreased cervical ROM.  Motor 5/5.  Neuro: Fine motor movements are normal in both hands. Cognitively appropriate Sensory exam.  DTR are 2+.  No clonus is noted.  Patient balance stable/good Skin: abrasions on right left leg Psych: pleasant. A little fatigued appearing (having headache)   Assessment & Plan:   Assessment:  1. 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  2. Posttraumatic stress syndrome.  3. Greater occipital neuralgia (bilateral) confirmed by response to recent nerve block  4. DM 2- now on insulin   PLAN:  1. Continue methadone rx. Continue demerol 50 mg qd prn for severe pain. Second rx'es for next month. We will continue the opioid monitoring program, this  consists of regular clinic visits, examinations, urine drug screen, pill counts as well as use of [place] Controlled Sub[place] Reporting System. [place].   -UDS   -Goal will be to decrease methadone to 20-10-20 schedule by next visit. I think she's ready  2. S/p greater occipital nerve RF with transient effects.  3. Continue topamax.   -sleep needs to be a priority.   4. Maintain cervical range motion/posture exercises.  5. Continue k-tape, aromatherapy, and cervical traction as she is able to  do.  6. Continue with  Dr. Kieth Brightlyodenbough for counseling and establishment of coping skills as it pertains to her pain. She has found this very helpful so far.  7. Fifteen minutes of face to face patient care time were spent during this visit. All questions were encouraged and answered. I'll see her back in about 2 months.  Greater than 50% of time during this encounter was spent counseling patient/family in regard to pain/coping.

## 2017-02-20 ENCOUNTER — Telehealth: Payer: Self-pay | Admitting: *Deleted

## 2017-02-20 LAB — TOXASSURE SELECT,+ANTIDEPR,UR

## 2017-02-20 NOTE — Telephone Encounter (Signed)
Urine drug screen for this encounter is consistent for prescribed medication. She had not had demerol in 3 days and it is prn.

## 2017-02-28 ENCOUNTER — Encounter: Payer: Worker's Compensation | Admitting: Psychology

## 2017-04-19 ENCOUNTER — Other Ambulatory Visit: Payer: Self-pay | Admitting: Physical Medicine & Rehabilitation

## 2017-04-19 DIAGNOSIS — Z5181 Encounter for therapeutic drug level monitoring: Secondary | ICD-10-CM

## 2017-04-19 DIAGNOSIS — G43011 Migraine without aura, intractable, with status migrainosus: Secondary | ICD-10-CM

## 2017-04-19 DIAGNOSIS — G43711 Chronic migraine without aura, intractable, with status migrainosus: Secondary | ICD-10-CM

## 2017-04-19 DIAGNOSIS — G4486 Cervicogenic headache: Secondary | ICD-10-CM

## 2017-04-19 DIAGNOSIS — G894 Chronic pain syndrome: Secondary | ICD-10-CM

## 2017-04-19 DIAGNOSIS — Z79899 Other long term (current) drug therapy: Secondary | ICD-10-CM

## 2017-04-19 DIAGNOSIS — R51 Headache: Secondary | ICD-10-CM

## 2017-04-19 DIAGNOSIS — Z79891 Long term (current) use of opiate analgesic: Secondary | ICD-10-CM

## 2017-04-19 DIAGNOSIS — M47812 Spondylosis without myelopathy or radiculopathy, cervical region: Secondary | ICD-10-CM

## 2017-04-19 DIAGNOSIS — M7918 Myalgia, other site: Secondary | ICD-10-CM

## 2017-04-21 ENCOUNTER — Encounter: Payer: Worker's Compensation | Admitting: Physical Medicine & Rehabilitation

## 2017-04-21 ENCOUNTER — Telehealth: Payer: Self-pay | Admitting: Physical Medicine & Rehabilitation

## 2017-04-21 DIAGNOSIS — Z5181 Encounter for therapeutic drug level monitoring: Secondary | ICD-10-CM

## 2017-04-21 DIAGNOSIS — M47812 Spondylosis without myelopathy or radiculopathy, cervical region: Secondary | ICD-10-CM

## 2017-04-21 DIAGNOSIS — R51 Headache: Secondary | ICD-10-CM

## 2017-04-21 DIAGNOSIS — G43711 Chronic migraine without aura, intractable, with status migrainosus: Secondary | ICD-10-CM

## 2017-04-21 DIAGNOSIS — Z79891 Long term (current) use of opiate analgesic: Secondary | ICD-10-CM

## 2017-04-21 DIAGNOSIS — M7918 Myalgia, other site: Secondary | ICD-10-CM

## 2017-04-21 DIAGNOSIS — G43011 Migraine without aura, intractable, with status migrainosus: Secondary | ICD-10-CM

## 2017-04-21 DIAGNOSIS — G894 Chronic pain syndrome: Secondary | ICD-10-CM

## 2017-04-21 DIAGNOSIS — G4486 Cervicogenic headache: Secondary | ICD-10-CM

## 2017-04-21 DIAGNOSIS — Z79899 Other long term (current) drug therapy: Secondary | ICD-10-CM

## 2017-04-21 MED ORDER — METHADONE HCL 10 MG PO TABS: 20.0000 mg | ORAL_TABLET | Freq: Three times a day (TID) | ORAL | 0 refills | Status: DC

## 2017-04-21 MED ORDER — MEPERIDINE HCL 50 MG PO TABS: 50.0000 mg | ORAL_TABLET | Freq: Every day | ORAL | 0 refills | Status: DC | PRN

## 2017-04-21 NOTE — Telephone Encounter (Signed)
Printed Rxs for LenaEunice to sign. Jaelie notified she can pick up tomorrow.

## 2017-04-21 NOTE — Telephone Encounter (Signed)
Patient had an appointment with Dr. Riley KillSwartz today, and this visit was canceled due to him being out of office.  We could not get patient scheduled until 10/10 and she will run out of medication before then.  She will need a refill on methadone, demerol and Topamax.  Please call patient when she can come pick these up.

## 2017-04-23 ENCOUNTER — Other Ambulatory Visit: Payer: Self-pay | Admitting: *Deleted

## 2017-04-23 NOTE — Telephone Encounter (Signed)
Error, already filled

## 2017-05-21 ENCOUNTER — Encounter: Payer: Self-pay | Admitting: Physical Medicine & Rehabilitation

## 2017-05-21 ENCOUNTER — Encounter
Payer: Worker's Compensation | Attending: Physical Medicine & Rehabilitation | Admitting: Physical Medicine & Rehabilitation

## 2017-05-21 VITALS — BP 144/78 | HR 54

## 2017-05-21 DIAGNOSIS — K449 Diaphragmatic hernia without obstruction or gangrene: Secondary | ICD-10-CM | POA: Insufficient documentation

## 2017-05-21 DIAGNOSIS — G43011 Migraine without aura, intractable, with status migrainosus: Secondary | ICD-10-CM | POA: Diagnosis not present

## 2017-05-21 DIAGNOSIS — E119 Type 2 diabetes mellitus without complications: Secondary | ICD-10-CM | POA: Diagnosis not present

## 2017-05-21 DIAGNOSIS — Z9889 Other specified postprocedural states: Secondary | ICD-10-CM | POA: Diagnosis not present

## 2017-05-21 DIAGNOSIS — Z794 Long term (current) use of insulin: Secondary | ICD-10-CM | POA: Insufficient documentation

## 2017-05-21 DIAGNOSIS — R51 Headache: Secondary | ICD-10-CM | POA: Diagnosis not present

## 2017-05-21 DIAGNOSIS — M47812 Spondylosis without myelopathy or radiculopathy, cervical region: Secondary | ICD-10-CM

## 2017-05-21 DIAGNOSIS — G4486 Cervicogenic headache: Secondary | ICD-10-CM

## 2017-05-21 DIAGNOSIS — M47892 Other spondylosis, cervical region: Secondary | ICD-10-CM | POA: Diagnosis not present

## 2017-05-21 DIAGNOSIS — Z87442 Personal history of urinary calculi: Secondary | ICD-10-CM | POA: Diagnosis not present

## 2017-05-21 DIAGNOSIS — G43919 Migraine, unspecified, intractable, without status migrainosus: Secondary | ICD-10-CM | POA: Diagnosis present

## 2017-05-21 DIAGNOSIS — H409 Unspecified glaucoma: Secondary | ICD-10-CM | POA: Insufficient documentation

## 2017-05-21 DIAGNOSIS — Z1331 Encounter for screening for depression: Secondary | ICD-10-CM | POA: Diagnosis not present

## 2017-05-21 DIAGNOSIS — Z9049 Acquired absence of other specified parts of digestive tract: Secondary | ICD-10-CM | POA: Diagnosis not present

## 2017-05-21 DIAGNOSIS — G43711 Chronic migraine without aura, intractable, with status migrainosus: Secondary | ICD-10-CM

## 2017-05-21 DIAGNOSIS — M5481 Occipital neuralgia: Secondary | ICD-10-CM | POA: Diagnosis not present

## 2017-05-21 DIAGNOSIS — G8929 Other chronic pain: Secondary | ICD-10-CM | POA: Diagnosis not present

## 2017-05-21 DIAGNOSIS — Z800 Family history of malignant neoplasm of digestive organs: Secondary | ICD-10-CM | POA: Insufficient documentation

## 2017-05-21 DIAGNOSIS — F4310 Post-traumatic stress disorder, unspecified: Secondary | ICD-10-CM | POA: Insufficient documentation

## 2017-05-21 DIAGNOSIS — K210 Gastro-esophageal reflux disease with esophagitis: Secondary | ICD-10-CM | POA: Diagnosis not present

## 2017-05-21 MED ORDER — METHADONE HCL 10 MG PO TABS: 20.0000 mg | ORAL_TABLET | Freq: Three times a day (TID) | ORAL | 0 refills | Status: DC

## 2017-05-21 MED ORDER — HYDROMORPHONE HCL 2 MG PO TABS: 2.0000 mg | ORAL_TABLET | Freq: Two times a day (BID) | ORAL | 0 refills | Status: DC | PRN

## 2017-05-21 NOTE — Patient Instructions (Signed)
PLEASE FEEL FREE TO CALL OUR OFFICE WITH ANY PROBLEMS OR QUESTIONS [phone])    BE MORE AGGRESSIVE WITH NAPROXEN, USING IT 2 X DAILY IF NEEDED FOR ONE WEEK AT A TIME.

## 2017-05-21 NOTE — Progress Notes (Signed)
Subjective:    Patient ID: Monica [name], female    DOB: 1951-04-11, 66 y.o.   MRN: [id number]  HPI   Monica [name] is here in follow up of her chronic pain/headaches. She has had more headaches over the last few months, in particular this Fall. She has felt more tense as well. She is using naproxen which does help. She is not using it regularly. She has tried to taper the methadone but her headachs increased as a result.   She comes in wanting an "answer" to her pain. She is increasingly frustrated with how it affects her life and the fact that she cannot escape it.   Pain Inventory Average Pain 9 Pain Right Now 8 My pain is constant, sharp, burning, stabbing, tingling and aching  In the last 24 hours, has pain interfered with the following? General activity 10 Relation with others 10 Enjoyment of life 10 What TIME of day is your pain at its worst? evening Sleep (in general) Poor  Pain is worse with: walking, bending and some activites Pain improves with: rest, heat/ice, pacing activities and medication Relief from Meds: 5  Mobility ability to climb steps?  yes do you drive?  yes  Function I need assistance with the following:  meal prep and household duties  Neuro/Psych No problems in this area  Prior Studies Any changes since last visit?  no  Physicians involved in your care Any changes since last visit?  no   Family History  Problem Relation Age of Onset  . Colon cancer Mother 55   Social History   Social History  . Marital status: Married    Spouse name: N/A  . Number of children: 3  . Years of education: N/A   Occupational History  . disabled Unemployed   Social History Main Topics  . Smoking status: Never Smoker  . Smokeless tobacco: Never Used  . Alcohol use No  . Drug use: No  . Sexual activity: Yes    Birth control/ protection: Post-menopausal   Other Topics Concern  . Not on file   Social History Narrative  . No narrative on file   Past  Surgical History:  Procedure Laterality Date  . BREAST LUMPECTOMY     left  . CATARACT EXTRACTION W/PHACO Left 10/07/2016   Procedure: CATARACT EXTRACTION PHACO AND INTRAOCULAR LENS PLACEMENT (IOC);  Surgeon: Gemma Payor, MD;  Location: [facility];  Service: Ophthalmology;  Laterality: Left;  CDE: 4.98  . CATARACT EXTRACTION W/PHACO Right 10/21/2016   Procedure: CATARACT EXTRACTION PHACO AND INTRAOCULAR LENS PLACEMENT RIGHT EYE CDE - 6.24;  Surgeon: Gemma Payor, MD;  Location: [facility];  Service: Ophthalmology;  Laterality: Right;  right  . CHOLECYSTECTOMY    . COLONOSCOPY  11/17/08   ext hemorrhoids  . CRANIOTOMY     secondary TBI  . 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 FINGER SURGERY     removal cyst left pinky  . gastro empy study  03/03/10   normal  . HAND SURGERY    . NECK SURGERY  1998  . NEPHROLITHOTOMY  05/13/2012   Procedure: NEPHROLITHOTOMY PERCUTANEOUS;  Surgeon: Marcine Matar, MD;  Location: [facility];  Service: Urology;  Laterality: Left;      Past Medical History:  Diagnosis Date  . Arthritis   . Cervical facet joint syndrome (HCC) 04/22/2012  . Cervicalgia   . Chronic headaches    Dr Hermelinda Medicus [place] [facility]  .  Chronic nausea    normal GES   . Complication of anesthesia    had problem with local anesthesia-tried to assist Physician  . DM (diabetes mellitus) (HCC)   . Elevated liver enzymes    hx of, normal 03/2010  . Glaucoma   . Headache(784.0)   . Hemorrhoids 11/17/08   Colonoscopy Dr Karilyn Cota  . Migraine without aura, with intractable migraine, so stated, without mention of status migrainosus   . Occipital neuralgia   . Posttraumatic stress disorder   . PTSD (post-traumatic stress disorder)   . Renal lithiasis   . 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 Torticollis   . Torticollis   . Unspecified musculoskeletal disorders and symptoms referable to neck    cervical/trapezius    There were no vitals taken for this visit.  Opioid Risk Score:   Fall Risk Score:  `1  Depression screen PHQ 2/9  Depression screen [facility] 2/9 10/15/2016 04/10/2016 04/04/2016 11/06/2015 10/19/2015 10/18/2015 10/02/2015  Decreased Interest 0 0 0 0  Down, Depressed, Hopeless 0 0 0 0  PHQ - 2 Score 0 0 0 0  Altered sleeping - - 3 - - - -  Tired, decreased energy - - 3 - - - -  Change in appetite - - 3 - - - -  Feeling bad or failure about yourself  - - 0 - - - -  Trouble concentrating - - 3 - - - -  Moving slowly or fidgety/restless - - 3 - - - -  Suicidal thoughts - - 0 - - - -  PHQ-9 Score - - 21 - - - -  Difficult doing work/chores - - Not difficult at all - - - -  Some recent data might be hidden     Review of Systems  Constitutional: Negative.   HENT: Negative.   Eyes: Negative.   Respiratory: Negative.   Cardiovascular: Negative.   Gastrointestinal: Negative.   Endocrine: Negative.   Genitourinary: Negative.   Musculoskeletal: Negative.   Skin: Negative.   Allergic/Immunologic: Negative.   Neurological: Negative.   Hematological: Negative.   Psychiatric/Behavioral: Negative.   All other systems reviewed and are negative.      Objective:   Physical Exam  Card: RRR Chest: CTA B.  Decreased cervical ROM.  Motor 5/5.  Neuro: Fine motor movements are normal in both hands. Cognitively appropriate Sensory exam. tender to palpation over frontal and occipital scalp and upper cervical spine DTR are 2+. No clonus is noted.  Patient balance stable/good Skin: intact Psych: pleasant. Anxious at times.    Assessment & Plan:   Assessment:  1. 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  2. Posttraumatic stress syndrome.  3. Greater occipital neuralgia (bilateral) confirmed by response to recent nerve block  4. DM 2- now on insulin   PLAN:  1. Continue methadone  rx. RF today  -We will continue the opioid monitoring program, this consists of regular clinic visits, examinations, routine drug screening, pill counts as well as use of [place] Controlled Substance Reporting System. [place] today.                             -Still would like to decrease methadone to 20-10-20 schedule by next visit.     -will replace demerol with  dilaudid  one q12 prn for severe breakthrough headache 2. S/p greater occipital nerve RF with transient effects.  3. Continue topamax.              -sleep an issue at times  -given samples of trokendi with pt education provided   4. Maintain cervical range motion/posture exercises.  5. Continue k-tape, aromatherapy, and cervical traction as she is able to do.  6. Will not follow up with Dr. Kieth Brightly due to excessive facility fees for his visits. .  7. Fifteen minutes of face to face patient care time were spent during this visit. All questions were encouraged and answered. I'll see her back in about 1 months.

## 2017-05-22 ENCOUNTER — Other Ambulatory Visit: Payer: Self-pay | Admitting: Physical Medicine & Rehabilitation

## 2017-05-22 DIAGNOSIS — R51 Headache: Secondary | ICD-10-CM

## 2017-05-22 DIAGNOSIS — M47812 Spondylosis without myelopathy or radiculopathy, cervical region: Secondary | ICD-10-CM

## 2017-05-22 DIAGNOSIS — Z5181 Encounter for therapeutic drug level monitoring: Secondary | ICD-10-CM

## 2017-05-22 DIAGNOSIS — G43011 Migraine without aura, intractable, with status migrainosus: Secondary | ICD-10-CM

## 2017-05-22 DIAGNOSIS — Z79899 Other long term (current) drug therapy: Secondary | ICD-10-CM

## 2017-05-22 DIAGNOSIS — G894 Chronic pain syndrome: Secondary | ICD-10-CM

## 2017-05-22 DIAGNOSIS — G4486 Cervicogenic headache: Secondary | ICD-10-CM

## 2017-05-22 DIAGNOSIS — Z79891 Long term (current) use of opiate analgesic: Secondary | ICD-10-CM

## 2017-05-22 DIAGNOSIS — M7918 Myalgia, other site: Secondary | ICD-10-CM

## 2017-05-22 DIAGNOSIS — G43711 Chronic migraine without aura, intractable, with status migrainosus: Secondary | ICD-10-CM

## 2017-06-02 ENCOUNTER — Other Ambulatory Visit: Payer: Self-pay | Admitting: Physical Medicine & Rehabilitation

## 2017-06-02 DIAGNOSIS — G894 Chronic pain syndrome: Secondary | ICD-10-CM

## 2017-06-02 DIAGNOSIS — Z5181 Encounter for therapeutic drug level monitoring: Secondary | ICD-10-CM

## 2017-06-02 DIAGNOSIS — M7918 Myalgia, other site: Secondary | ICD-10-CM

## 2017-06-02 DIAGNOSIS — Z79899 Other long term (current) drug therapy: Secondary | ICD-10-CM

## 2017-06-02 DIAGNOSIS — G43711 Chronic migraine without aura, intractable, with status migrainosus: Secondary | ICD-10-CM

## 2017-06-02 DIAGNOSIS — M47812 Spondylosis without myelopathy or radiculopathy, cervical region: Secondary | ICD-10-CM

## 2017-06-17 ENCOUNTER — Encounter
Payer: Worker's Compensation | Attending: Physical Medicine & Rehabilitation | Admitting: Physical Medicine & Rehabilitation

## 2017-06-17 ENCOUNTER — Encounter: Payer: Self-pay | Admitting: Physical Medicine & Rehabilitation

## 2017-06-17 VITALS — BP 123/73 | HR 53

## 2017-06-17 DIAGNOSIS — Z794 Long term (current) use of insulin: Secondary | ICD-10-CM | POA: Diagnosis not present

## 2017-06-17 DIAGNOSIS — M5481 Occipital neuralgia: Secondary | ICD-10-CM | POA: Insufficient documentation

## 2017-06-17 DIAGNOSIS — G43011 Migraine without aura, intractable, with status migrainosus: Secondary | ICD-10-CM | POA: Diagnosis not present

## 2017-06-17 DIAGNOSIS — F4310 Post-traumatic stress disorder, unspecified: Secondary | ICD-10-CM | POA: Diagnosis not present

## 2017-06-17 DIAGNOSIS — E119 Type 2 diabetes mellitus without complications: Secondary | ICD-10-CM | POA: Diagnosis not present

## 2017-06-17 DIAGNOSIS — R51 Headache: Secondary | ICD-10-CM | POA: Diagnosis not present

## 2017-06-17 DIAGNOSIS — G43711 Chronic migraine without aura, intractable, with status migrainosus: Secondary | ICD-10-CM | POA: Diagnosis not present

## 2017-06-17 DIAGNOSIS — K210 Gastro-esophageal reflux disease with esophagitis: Secondary | ICD-10-CM | POA: Diagnosis not present

## 2017-06-17 DIAGNOSIS — G4486 Cervicogenic headache: Secondary | ICD-10-CM

## 2017-06-17 DIAGNOSIS — H409 Unspecified glaucoma: Secondary | ICD-10-CM | POA: Diagnosis not present

## 2017-06-17 DIAGNOSIS — Z1331 Encounter for screening for depression: Secondary | ICD-10-CM | POA: Insufficient documentation

## 2017-06-17 DIAGNOSIS — Z87442 Personal history of urinary calculi: Secondary | ICD-10-CM | POA: Diagnosis not present

## 2017-06-17 DIAGNOSIS — G8929 Other chronic pain: Secondary | ICD-10-CM | POA: Diagnosis not present

## 2017-06-17 DIAGNOSIS — Z800 Family history of malignant neoplasm of digestive organs: Secondary | ICD-10-CM | POA: Insufficient documentation

## 2017-06-17 DIAGNOSIS — Z9889 Other specified postprocedural states: Secondary | ICD-10-CM | POA: Diagnosis not present

## 2017-06-17 DIAGNOSIS — Z9049 Acquired absence of other specified parts of digestive tract: Secondary | ICD-10-CM | POA: Insufficient documentation

## 2017-06-17 DIAGNOSIS — G43919 Migraine, unspecified, intractable, without status migrainosus: Secondary | ICD-10-CM | POA: Insufficient documentation

## 2017-06-17 DIAGNOSIS — K449 Diaphragmatic hernia without obstruction or gangrene: Secondary | ICD-10-CM | POA: Insufficient documentation

## 2017-06-17 MED ORDER — HYDROMORPHONE HCL 2 MG PO TABS: 2.0000 mg | ORAL_TABLET | Freq: Two times a day (BID) | ORAL | 0 refills | Status: DC | PRN

## 2017-06-17 MED ORDER — METHADONE HCL 10 MG PO TABS: 20.0000 mg | ORAL_TABLET | Freq: Three times a day (TID) | ORAL | 0 refills | Status: DC

## 2017-06-17 NOTE — Progress Notes (Signed)
Botox Injection for chronic migraine headaches ICD 10: Intractable migraine without aura and with status migrainosus  Intractable chronic migraine without aura and with status migrainosus - Plan: methadone (DOLOPHINE) 10 MG tablet, HYDROmorphone (DILAUDID) 2 MG tablet, DISCONTINUED: HYDROmorphone (DILAUDID) 2 MG tablet, DISCONTINUED: methadone (DOLOPHINE) 10 MG tablet  Cervicogenic headache (Location of Primary Source of Pain) (Bilateral) (L>R) - Plan: methadone (DOLOPHINE) 10 MG tablet, HYDROmorphone (DILAUDID) 2 MG tablet, DISCONTINUED: HYDROmorphone (DILAUDID) 2 MG tablet, DISCONTINUED: methadone (DOLOPHINE) 10 MG tablet   Dilution: 100 Units/ 2ml preservative free NS Indication: refractory headaches (At least 15 days per month/headache lasting greater than 4 hours per day) incompletely responsive to other more conservative measures.  Informed consent was obtained after describing risks and benefits of the procedure with the patient. This includes bleeding, bruising, infection, excessive weakness, or medication side effects. A REMS form is on file and signed. Needle: 30g 1/2 inch needle   Number of units per muscle:  Right temporalis 10 units, 2 access points Left temporalis 10 units,  2 access points Right frontalis 10 units, 2 access points Left frontalis 10 units, 2 access points Procerus 5 units, 1 access point Right corrugator 5 units, 1 access point Left corrugator 5 units, 1 access point Right occipitalis 15 units, 3 access points Left occipitalis 15 units, 3 access points Right cervical paraspinal 10 units, 2 access points Left cervical paraspinals 10 units, 2 access points  Right trapezius 15 units, 3 access points Left trapezius 15 units, 3 access points   Remaining 45 units was injected in the occipitalis muscles and right trap, 15u was discarded    All injections were done after  after negative drawback for blood. The patient tolerated the procedure well. Post  procedure instructions were given. Return in about 2 months (around 08/17/2017).

## 2017-06-17 NOTE — Patient Instructions (Signed)
TAKE THE TROKENDI 300MG  AT NIGHT TIME ALL AT ONCE TO REPLACE TOPAMAX

## 2017-08-18 ENCOUNTER — Ambulatory Visit: Payer: Self-pay | Admitting: Physical Medicine & Rehabilitation

## 2017-08-20 ENCOUNTER — Encounter: Payer: Self-pay | Admitting: Physical Medicine & Rehabilitation

## 2017-08-20 ENCOUNTER — Encounter
Payer: Worker's Compensation | Attending: Physical Medicine & Rehabilitation | Admitting: Physical Medicine & Rehabilitation

## 2017-08-20 VITALS — BP 133/70 | HR 53

## 2017-08-20 DIAGNOSIS — Z9842 Cataract extraction status, left eye: Secondary | ICD-10-CM | POA: Diagnosis not present

## 2017-08-20 DIAGNOSIS — Z9049 Acquired absence of other specified parts of digestive tract: Secondary | ICD-10-CM | POA: Diagnosis not present

## 2017-08-20 DIAGNOSIS — F4310 Post-traumatic stress disorder, unspecified: Secondary | ICD-10-CM | POA: Insufficient documentation

## 2017-08-20 DIAGNOSIS — E119 Type 2 diabetes mellitus without complications: Secondary | ICD-10-CM | POA: Diagnosis not present

## 2017-08-20 DIAGNOSIS — Z800 Family history of malignant neoplasm of digestive organs: Secondary | ICD-10-CM | POA: Insufficient documentation

## 2017-08-20 DIAGNOSIS — M542 Cervicalgia: Secondary | ICD-10-CM | POA: Diagnosis not present

## 2017-08-20 DIAGNOSIS — Z794 Long term (current) use of insulin: Secondary | ICD-10-CM | POA: Diagnosis not present

## 2017-08-20 DIAGNOSIS — Z9841 Cataract extraction status, right eye: Secondary | ICD-10-CM | POA: Diagnosis not present

## 2017-08-20 DIAGNOSIS — R51 Headache: Secondary | ICD-10-CM | POA: Diagnosis not present

## 2017-08-20 DIAGNOSIS — R1310 Dysphagia, unspecified: Secondary | ICD-10-CM | POA: Diagnosis not present

## 2017-08-20 DIAGNOSIS — M47812 Spondylosis without myelopathy or radiculopathy, cervical region: Secondary | ICD-10-CM | POA: Diagnosis not present

## 2017-08-20 DIAGNOSIS — Z79891 Long term (current) use of opiate analgesic: Secondary | ICD-10-CM | POA: Insufficient documentation

## 2017-08-20 DIAGNOSIS — G4486 Cervicogenic headache: Secondary | ICD-10-CM

## 2017-08-20 DIAGNOSIS — M1288 Other specific arthropathies, not elsewhere classified, other specified site: Secondary | ICD-10-CM | POA: Diagnosis not present

## 2017-08-20 DIAGNOSIS — G8929 Other chronic pain: Secondary | ICD-10-CM | POA: Diagnosis not present

## 2017-08-20 DIAGNOSIS — G43711 Chronic migraine without aura, intractable, with status migrainosus: Secondary | ICD-10-CM | POA: Diagnosis not present

## 2017-08-20 DIAGNOSIS — G43919 Migraine, unspecified, intractable, without status migrainosus: Secondary | ICD-10-CM | POA: Diagnosis not present

## 2017-08-20 DIAGNOSIS — Z961 Presence of intraocular lens: Secondary | ICD-10-CM | POA: Diagnosis not present

## 2017-08-20 DIAGNOSIS — Z87442 Personal history of urinary calculi: Secondary | ICD-10-CM | POA: Diagnosis not present

## 2017-08-20 DIAGNOSIS — H409 Unspecified glaucoma: Secondary | ICD-10-CM | POA: Diagnosis not present

## 2017-08-20 DIAGNOSIS — M5481 Occipital neuralgia: Secondary | ICD-10-CM | POA: Insufficient documentation

## 2017-08-20 MED ORDER — HYDROMORPHONE HCL 2 MG PO TABS: 2.0000 mg | ORAL_TABLET | Freq: Two times a day (BID) | ORAL | 0 refills | Status: DC | PRN

## 2017-08-20 MED ORDER — METHADONE HCL 10 MG PO TABS: 20.0000 mg | ORAL_TABLET | Freq: Three times a day (TID) | ORAL | 0 refills | Status: DC

## 2017-08-20 NOTE — Patient Instructions (Signed)
PLEASE FEEL FREE TO CALL OUR OFFICE WITH ANY PROBLEMS OR QUESTIONS ([phone])      

## 2017-08-20 NOTE — Progress Notes (Signed)
Subjective:    Patient ID: Monica [name], female    DOB: Sep 18, 1950, 67 y.o.   MRN: [id number]  HPI    Monica [name] is here in follow-up of her chronic pain.  2 months ago we performed Botox injections for her migraine headaches.  She had reduction in her pain but noticed that she had left lid ptosis and an elevation of her right brow apparently.  This did frighten her to an extent but symptoms have generally begun to improve.  She did note that the Botox helped her pain a good bit although did not prevent her from having headaches.  Pain levels are down to 5 out of 10 today.  Pain seems to be more pronounced as she goes to the day particularly late in the afternoon and into early evening.  She is taking her naproxen in the late afternoon now which seems to be helping with some of the symptoms.    Pain Inventory Average Pain 5 Pain Right Now 5 My pain is sharp, dull and aching  In the last 24 hours, has pain interfered with the following? General activity 5 Relation with others 5 Enjoyment of life 5 What TIME of day is your pain at its worst? evening Sleep (in general) Poor  Pain is worse with: bending Pain improves with: rest, heat/ice, pacing activities and medication Relief from Meds: 6  Mobility walk without assistance ability to climb steps?  yes do you drive?  yes  Function retired I need assistance with the following:  meal prep, household duties and shopping  Neuro/Psych dizziness  Prior Studies Any changes since last visit?  no  Physicians involved in your care Any changes since last visit?  no   Family History  Problem Relation Age of Onset  . Colon cancer Mother 65   Social History   Socioeconomic History  . Marital status: Married    Spouse name: Not on file  . Number of children: 3  . Years of education: Not on file  . Highest education level: Not on file  Social Needs  . Financial resource strain: Not on file  . Food insecurity - worry: Not on  file  . Food insecurity - inability: Not on file  . Transportation needs - medical: Not on file  . Transportation needs - non-medical: Not on file  Occupational History  . Occupation: disabled    Associate Professor: UNEMPLOYED  Tobacco Use  . Smoking status: Never Smoker  . Smokeless tobacco: Never Used  Substance and Sexual Activity  . Alcohol use: No  . Drug use: No  . Sexual activity: Yes    Birth control/protection: Post-menopausal  Other Topics Concern  . Not on file  Social History Narrative  . Not on file   Past Surgical History:  Procedure Laterality Date  . BREAST LUMPECTOMY     left  . CATARACT EXTRACTION W/PHACO Left 10/07/2016   Procedure: CATARACT EXTRACTION PHACO AND INTRAOCULAR LENS PLACEMENT (IOC);  Surgeon: Gemma Payor, MD;  Location: [facility];  Service: Ophthalmology;  Laterality: Left;  CDE: 4.98  . CATARACT EXTRACTION W/PHACO Right 10/21/2016   Procedure: CATARACT EXTRACTION PHACO AND INTRAOCULAR LENS PLACEMENT RIGHT EYE CDE - 6.24;  Surgeon: Gemma Payor, MD;  Location: [facility];  Service: Ophthalmology;  Laterality: Right;  right  . CHOLECYSTECTOMY    . COLONOSCOPY  11/17/08   ext hemorrhoids  . CRANIOTOMY     secondary TBI  . ESOPHAGOGASTRODUODENOSCOPY  04/05/10   [name] (34F),erosive reflux  esophagitis, small hiatal hernia,  . FINGER SURGERY     removal cyst left pinky  . gastro empy study  03/03/10   normal  . HAND SURGERY    . NECK SURGERY  1998  . NEPHROLITHOTOMY  05/13/2012   Procedure: NEPHROLITHOTOMY PERCUTANEOUS;  Surgeon: Marcine Matar, MD;  Location: [facility];  Service: Urology;  Laterality: Left;      Past Medical History:  Diagnosis Date  . Arthritis   . Cervical facet joint syndrome 04/22/2012  . Cervicalgia   . Chronic headaches    Dr Hermelinda Medicus [place] [facility]  . Chronic nausea    normal GES   . Complication of anesthesia    had problem with local anesthesia-tried to assist Physician  . DM (diabetes mellitus) (HCC)   . Elevated  liver enzymes    hx of, normal 03/2010  . Glaucoma   . Headache(784.0)   . Hemorrhoids 11/17/08   Colonoscopy Dr Karilyn Cota  . Migraine without aura, with intractable migraine, so stated, without mention of status migrainosus   . Occipital neuralgia   . Posttraumatic stress disorder   . PTSD (post-traumatic stress disorder)   . Renal lithiasis   . 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 Torticollis   . Torticollis   . Unspecified musculoskeletal disorders and symptoms referable to neck    cervical/trapezius   There were no vitals taken for this visit.  Opioid Risk Score:   Fall Risk Score:  `1  Depression screen PHQ 2/9  Depression screen [facility] 2/9 10/15/2016 04/10/2016 04/04/2016 11/06/2015 10/19/2015 10/18/2015 10/02/2015  Decreased Interest 3 3 3  0 0 0 0  Down, Depressed, Hopeless 3 3 3  0 0 0 0  PHQ - 2 Score 6 6 6  0 0 0 0  Altered sleeping - - 3 - - - -  Tired, decreased energy - - 3 - - - -  Change in appetite - - 3 - - - -  Feeling bad or failure about yourself  - - 0 - - - -  Trouble concentrating - - 3 - - - -  Moving slowly or fidgety/restless - - 3 - - - -  Suicidal thoughts - - 0 - - - -  PHQ-9 Score - - 21 - - - -  Difficult doing work/chores - - Not difficult at all - - - -  Some recent data might be hidden     Review of Systems  Constitutional: Negative.   HENT: Negative.   Eyes: Negative.   Respiratory: Negative.   Cardiovascular: Negative.   Gastrointestinal: Negative.   Endocrine: Negative.   Genitourinary: Negative.   Musculoskeletal: Positive for neck pain.  Skin: Negative.   Allergic/Immunologic: Negative.   Neurological: Negative.   Hematological: Negative.   Psychiatric/Behavioral: Negative.   All other systems reviewed and are negative.      Objective:   Physical Exam  Card: RRR Chest: CTA B  Decreased cervical ROM.  Motor 5/5.  Neuro: Fine motor movements are normal in both hands. Cognitively appropriate.still left eye ptosis. Otherwise  exam normal.  Sensory exam. tender to palpation over frontal and occipital scalp and upper cervical spine DTR are 2+. No clonus is noted.  Patient balance stable/good Skin: intact Psych: pleasant overall.    Assessment & Plan:   Assessment:  1. 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  2. Posttraumatic stress syndrome.  3. Greater occipital neuralgia (  bilateral) confirmed by response to recent nerve block  4. DM 2- now on insulin   PLAN:  1. Continue methadone rx. RF today. Second rx was provided           - We will continue the opioid monitoring program, this consists of regular clinic visits, examinations, routine drug screening, pill counts as well as use of [place]. [place] today.      -Still would like to decrease methadone to 20-10-20 schedule this spring. Consider change to 10mg  tabs so that we can taper by 5mg  increments            -continue demerol with 2mg  dilaudid one q12 prn for severe breakthrough headache   -discussed impact of mood on pain.  2. S/p greater occipital nerve RF with transient effects.  3. Continue topamax.  -sleep can be an issue           -did not tolerate trokendi  4. Consider botox injections with less medication in frontal/peri-ocular area.  5. Continue k-tape, aromatherapy, and cervical traction as she is able to do.  6. follow up with primary re: dysphagia of solids  7. 15 minutes of face to face patient care time were spent during this visit. All questions were encouraged and answered. I'll see her back in about 2months.

## 2017-08-25 LAB — DRUG TOX MONITOR 1 W/CONF, ORAL FLD
Amphetamines: NEGATIVE ng/mL (ref <10)
BUPRENORPHINE: NEGATIVE ng/mL (ref <0.10)
Barbiturates: NEGATIVE ng/mL (ref <10)
Benzodiazepines: NEGATIVE ng/mL (ref <0.50)
COCAINE: NEGATIVE ng/mL (ref <5.0)
EDDP: 8.2 ng/mL — AB (ref <5.0)
FENTANYL: NEGATIVE ng/mL (ref <0.10)
HEROIN METABOLITE: NEGATIVE ng/mL (ref <1.0)
MARIJUANA: NEGATIVE ng/mL (ref <2.5)
MDMA: NEGATIVE ng/mL (ref <10)
METHADONE: 493.6 ng/mL — AB (ref <5.0)
METHADONE: POSITIVE ng/mL — AB (ref <5.0)
Meprobamate: NEGATIVE ng/mL (ref <2.5)
NICOTINE METABOLITE: NEGATIVE ng/mL (ref <5.0)
OPIATES: NEGATIVE ng/mL (ref <2.5)
PHENCYCLIDINE: NEGATIVE ng/mL (ref <10)
Tapentadol: NEGATIVE ng/mL (ref <5.0)
Tramadol: NEGATIVE ng/mL (ref <5.0)
Zolpidem: NEGATIVE ng/mL (ref <5.0)

## 2017-08-25 LAB — DRUG TOX ALC METAB W/CON, ORAL FLD: ALCOHOL METABOLITE: NEGATIVE ng/mL (ref <25)

## 2017-08-26 ENCOUNTER — Telehealth: Payer: Self-pay | Admitting: *Deleted

## 2017-08-26 NOTE — Telephone Encounter (Signed)
Oral swab drug screen was consistent for prescribed medications methadone.  She reported taking her dilaudid same day as test as well but there is no opiate showing positive. She filled her dilaudid rx 07/19/17 and 30 day supply would have been out if taken bid and her count did show 0, so question if last dose was taken a couple of days prior.

## 2017-08-29 ENCOUNTER — Other Ambulatory Visit: Payer: Self-pay | Admitting: Physical Medicine & Rehabilitation

## 2017-08-29 DIAGNOSIS — Z79891 Long term (current) use of opiate analgesic: Secondary | ICD-10-CM

## 2017-08-29 DIAGNOSIS — M7918 Myalgia, other site: Secondary | ICD-10-CM

## 2017-08-29 DIAGNOSIS — G894 Chronic pain syndrome: Secondary | ICD-10-CM

## 2017-08-29 DIAGNOSIS — M47812 Spondylosis without myelopathy or radiculopathy, cervical region: Secondary | ICD-10-CM

## 2017-08-29 DIAGNOSIS — Z79899 Other long term (current) drug therapy: Secondary | ICD-10-CM

## 2017-08-29 DIAGNOSIS — R51 Headache: Secondary | ICD-10-CM

## 2017-08-29 DIAGNOSIS — G43711 Chronic migraine without aura, intractable, with status migrainosus: Secondary | ICD-10-CM

## 2017-08-29 DIAGNOSIS — Z5181 Encounter for therapeutic drug level monitoring: Secondary | ICD-10-CM

## 2017-08-29 DIAGNOSIS — G4486 Cervicogenic headache: Secondary | ICD-10-CM

## 2017-08-29 DIAGNOSIS — G43011 Migraine without aura, intractable, with status migrainosus: Secondary | ICD-10-CM

## 2017-08-29 NOTE — Telephone Encounter (Signed)
Recieved electronic medication request for promethazine,  Patient has not been prescribed this medication since 06-10-2016, no mention in last notes to use or continue this medication, please advise

## 2017-09-27 ENCOUNTER — Other Ambulatory Visit: Payer: Self-pay | Admitting: Physical Medicine & Rehabilitation

## 2017-09-27 DIAGNOSIS — M47812 Spondylosis without myelopathy or radiculopathy, cervical region: Secondary | ICD-10-CM

## 2017-09-27 DIAGNOSIS — M7918 Myalgia, other site: Secondary | ICD-10-CM

## 2017-09-27 DIAGNOSIS — G4486 Cervicogenic headache: Secondary | ICD-10-CM

## 2017-09-27 DIAGNOSIS — G43011 Migraine without aura, intractable, with status migrainosus: Secondary | ICD-10-CM

## 2017-09-27 DIAGNOSIS — G894 Chronic pain syndrome: Secondary | ICD-10-CM

## 2017-09-27 DIAGNOSIS — G43711 Chronic migraine without aura, intractable, with status migrainosus: Secondary | ICD-10-CM

## 2017-09-27 DIAGNOSIS — R51 Headache: Secondary | ICD-10-CM

## 2017-09-27 DIAGNOSIS — Z5181 Encounter for therapeutic drug level monitoring: Secondary | ICD-10-CM

## 2017-09-27 DIAGNOSIS — Z79891 Long term (current) use of opiate analgesic: Secondary | ICD-10-CM

## 2017-09-27 DIAGNOSIS — Z79899 Other long term (current) drug therapy: Secondary | ICD-10-CM

## 2017-10-10 ENCOUNTER — Other Ambulatory Visit: Payer: Self-pay | Admitting: Physical Medicine & Rehabilitation

## 2017-10-10 DIAGNOSIS — S7002XS Contusion of left hip, sequela: Secondary | ICD-10-CM

## 2017-10-15 ENCOUNTER — Encounter
Payer: Worker's Compensation | Attending: Physical Medicine & Rehabilitation | Admitting: Physical Medicine & Rehabilitation

## 2017-10-15 ENCOUNTER — Encounter: Payer: Self-pay | Admitting: Physical Medicine & Rehabilitation

## 2017-10-15 VITALS — BP 150/86 | HR 67 | Resp 14

## 2017-10-15 DIAGNOSIS — H409 Unspecified glaucoma: Secondary | ICD-10-CM | POA: Insufficient documentation

## 2017-10-15 DIAGNOSIS — G8929 Other chronic pain: Secondary | ICD-10-CM | POA: Diagnosis present

## 2017-10-15 DIAGNOSIS — M47812 Spondylosis without myelopathy or radiculopathy, cervical region: Secondary | ICD-10-CM

## 2017-10-15 DIAGNOSIS — G43711 Chronic migraine without aura, intractable, with status migrainosus: Secondary | ICD-10-CM | POA: Diagnosis not present

## 2017-10-15 DIAGNOSIS — G43919 Migraine, unspecified, intractable, without status migrainosus: Secondary | ICD-10-CM | POA: Insufficient documentation

## 2017-10-15 DIAGNOSIS — R51 Headache: Secondary | ICD-10-CM

## 2017-10-15 DIAGNOSIS — Z79891 Long term (current) use of opiate analgesic: Secondary | ICD-10-CM | POA: Insufficient documentation

## 2017-10-15 DIAGNOSIS — Z800 Family history of malignant neoplasm of digestive organs: Secondary | ICD-10-CM | POA: Insufficient documentation

## 2017-10-15 DIAGNOSIS — E119 Type 2 diabetes mellitus without complications: Secondary | ICD-10-CM | POA: Insufficient documentation

## 2017-10-15 DIAGNOSIS — Z961 Presence of intraocular lens: Secondary | ICD-10-CM | POA: Diagnosis not present

## 2017-10-15 DIAGNOSIS — M1288 Other specific arthropathies, not elsewhere classified, other specified site: Secondary | ICD-10-CM | POA: Insufficient documentation

## 2017-10-15 DIAGNOSIS — Z794 Long term (current) use of insulin: Secondary | ICD-10-CM | POA: Insufficient documentation

## 2017-10-15 DIAGNOSIS — M5481 Occipital neuralgia: Secondary | ICD-10-CM | POA: Diagnosis not present

## 2017-10-15 DIAGNOSIS — F4310 Post-traumatic stress disorder, unspecified: Secondary | ICD-10-CM | POA: Diagnosis not present

## 2017-10-15 DIAGNOSIS — G894 Chronic pain syndrome: Secondary | ICD-10-CM | POA: Diagnosis not present

## 2017-10-15 DIAGNOSIS — G4486 Cervicogenic headache: Secondary | ICD-10-CM

## 2017-10-15 DIAGNOSIS — Z9842 Cataract extraction status, left eye: Secondary | ICD-10-CM | POA: Diagnosis not present

## 2017-10-15 DIAGNOSIS — M542 Cervicalgia: Secondary | ICD-10-CM | POA: Insufficient documentation

## 2017-10-15 DIAGNOSIS — Z9841 Cataract extraction status, right eye: Secondary | ICD-10-CM | POA: Insufficient documentation

## 2017-10-15 DIAGNOSIS — Z87442 Personal history of urinary calculi: Secondary | ICD-10-CM | POA: Insufficient documentation

## 2017-10-15 MED ORDER — METHADONE HCL 10 MG PO TABS: 20.0000 mg | ORAL_TABLET | Freq: Three times a day (TID) | ORAL | 0 refills | Status: DC

## 2017-10-15 MED ORDER — HYDROMORPHONE HCL 2 MG PO TABS: 2.0000 mg | ORAL_TABLET | Freq: Two times a day (BID) | ORAL | 0 refills | Status: DC | PRN

## 2017-10-15 MED ORDER — ERENUMAB-AOOE 70 MG/ML ~~LOC~~ SOAJ: 70.0000 mg | SUBCUTANEOUS | 3 refills | Status: DC

## 2017-10-15 NOTE — Patient Instructions (Signed)
PLEASE FEEL FREE TO CALL OUR OFFICE WITH ANY PROBLEMS OR QUESTIONS ([phone])      

## 2017-10-15 NOTE — Progress Notes (Signed)
Subjective:    Patient ID: Monica [name], female    DOB: 10/06/1950, 67 y.o.   MRN: [id number]  HPI   Monica [name] is here in follow up of her chronic pain.  She states that her pain is increased with the cold and damp weather as of late.  She is trying to get through the best she can but some days are really a struggle.  Headaches are daily typically most severe towards the end of the day but they can really happen at any time.  She does feel that her husband is more understanding of her pain and tends to know when to back off and no one to help.  She remains on methadone and Dilaudid as prescribed.  She is taking Topamax as a prophylactic and using Imitrex for most severe headaches.  Pain Inventory Average Pain 9 Pain Right Now 8 My pain is constant, sharp, burning, stabbing and aching  In the last 24 hours, has pain interfered with the following? General activity 7 Relation with others 10 Enjoyment of life 10 What TIME of day is your pain at its worst? daytime Sleep (in general) Poor  Pain is worse with: bending and some activites Pain improves with: rest, heat/ice, pacing activities and medication Relief from Meds: 5  Mobility walk without assistance ability to climb steps?  yes do you drive?  yes  Function Do you have any goals in this area?  no  Neuro/Psych No problems in this area  Prior Studies Any changes since last visit?  no  Physicians involved in your care Any changes since last visit?  no   Family History  Problem Relation Age of Onset  . Colon cancer Mother 8060   Social History   Socioeconomic History  . Marital status: Married    Spouse name: None  . Number of children: 3  . Years of education: None  . Highest education level: None  Social Needs  . Financial resource strain: None  . Food insecurity - worry: None  . Food insecurity - inability: None  . Transportation needs - medical: None  . Transportation needs - non-medical: None    Occupational History  . Occupation: disabled    Associate Professormployer: UNEMPLOYED  Tobacco Use  . Smoking status: Never Smoker  . Smokeless tobacco: Never Used  Substance and Sexual Activity  . Alcohol use: No  . Drug use: No  . Sexual activity: Yes    Birth control/protection: Post-menopausal  Other Topics Concern  . None  Social History Narrative  . None   Past Surgical History:  Procedure Laterality Date  . BREAST LUMPECTOMY     left  . CATARACT EXTRACTION W/PHACO Left 10/07/2016   Procedure: CATARACT EXTRACTION PHACO AND INTRAOCULAR LENS PLACEMENT (IOC);  Surgeon: Gemma PayorKerry Hunt, MD;  Location: [facility];  Service: Ophthalmology;  Laterality: Left;  CDE: 4.98  . CATARACT EXTRACTION W/PHACO Right 10/21/2016   Procedure: CATARACT EXTRACTION PHACO AND INTRAOCULAR LENS PLACEMENT RIGHT EYE CDE - 6.24;  Surgeon: Gemma PayorKerry Hunt, MD;  Location: [facility];  Service: Ophthalmology;  Laterality: Right;  right  . CHOLECYSTECTOMY    . COLONOSCOPY  11/17/08   ext hemorrhoids  . CRANIOTOMY     secondary TBI  . ESOPHAGOGASTRODUODENOSCOPY  04/05/10   [name] (22F),erosive reflux esophagitis, small hiatal hernia,  . FINGER SURGERY     removal cyst left pinky  . gastro empy study  03/03/10   normal  . HAND SURGERY    . NECK  SURGERY  1998  . NEPHROLITHOTOMY  05/13/2012   Procedure: NEPHROLITHOTOMY PERCUTANEOUS;  Surgeon: Marcine Matar, MD;  Location: [facility];  Service: Urology;  Laterality: Left;      Past Medical History:  Diagnosis Date  . Arthritis   . Cervical facet joint syndrome 04/22/2012  . Cervicalgia   . Chronic headaches    Dr Hermelinda Medicus [place] [facility]  . Chronic nausea    normal GES   . Complication of anesthesia    had problem with local anesthesia-tried to assist Physician  . DM (diabetes mellitus) (HCC)   . Elevated liver enzymes    hx of, normal 03/2010  . Glaucoma   . Headache(784.0)   . Hemorrhoids 11/17/08   Colonoscopy Dr Karilyn Cota  . Migraine without aura, with intractable  migraine, so stated, without mention of status migrainosus   . Occipital neuralgia   . Posttraumatic stress disorder   . PTSD (post-traumatic stress disorder)   . Renal lithiasis   . 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 Torticollis   . Torticollis   . Unspecified musculoskeletal disorders and symptoms referable to neck    cervical/trapezius   BP (!) 150/86 (BP Location: Left Arm, Patient Position: Sitting, Cuff Size: Normal)   Pulse 67   Resp 14   SpO2 91%   Opioid Risk Score:   Fall Risk Score:  `1  Depression screen PHQ 2/9  Depression screen [facility] 2/9 10/15/2016 04/10/2016 04/04/2016 11/06/2015 10/19/2015 10/18/2015 10/02/2015  Decreased Interest 3 3 3  0 0 0 0  Down, Depressed, Hopeless 3 3 3  0 0 0 0  PHQ - 2 Score 6 6 6  0 0 0 0  Altered sleeping - - 3 - - - -  Tired, decreased energy - - 3 - - - -  Change in appetite - - 3 - - - -  Feeling bad or failure about yourself  - - 0 - - - -  Trouble concentrating - - 3 - - - -  Moving slowly or fidgety/restless - - 3 - - - -  Suicidal thoughts - - 0 - - - -  PHQ-9 Score - - 21 - - - -  Difficult doing work/chores - - Not difficult at all - - - -  Some recent data might be hidden    Review of Systems  Constitutional: Negative.   HENT: Negative.   Eyes: Positive for photophobia and visual disturbance.  Respiratory: Negative.   Cardiovascular: Negative.   Gastrointestinal: Negative.   Endocrine: Negative.   Genitourinary: Negative.   Musculoskeletal: Negative.   Skin: Negative.   Allergic/Immunologic: Negative.   Neurological: Positive for dizziness, light-headedness and headaches.  Hematological: Negative.   Psychiatric/Behavioral: Negative.   All other systems reviewed and are negative.      Objective:   Physical Exam  Card: RRR Chest: CTA B  Decreased cervical ROM.  Motor 5/5 in all 4's.  Neuro: Fine motor movements are normal in both hands. No ptosis seen Sensory exam. occipital and cervical muscles TTP DTR  are 2+. No clonus is noted.  Patient balance stable/good Skin:intact Psych: a little more reserved today   Assessment & Plan:   Assessment:  1. 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  2. Posttraumatic stress syndrome.  3. Greater occipital neuralgia (bilateral) confirmed by response to recent nerve block  4. DM 2- now on insulin   PLAN:  1. Continue  methadone rx.RF today. Second rx was provided - We will continue the opioid monitoring program, this consists of regular clinic visits, examinations, routine drug screening, pill counts as well as use of [place] Controlled Substance Reporting System. [place] today.     -Consider decrease at some point of methadone to 20-10-20 schedule this spring. -continue demerol with 2mg  dilaudid one q12 prn for severe breakthrough headache         -discussed impact of mood on pain.  2.  History of recent greater occipital nerve RF with transient effects.  3. Continue topamax.  -sleep can be an issue -did not tolerate trokendi 4. Consider botox injections with less medication in frontal/peri-ocular area in the future 5. Would like to initiate a trial of aimovig 70mg /ml monthly injection. She has exhausted other treatment modalities.  6  15  minutes of face to face patient care time were spent during this visit. All questions were encouraged and answered. I'll see her back in about9months.

## 2017-11-29 ENCOUNTER — Other Ambulatory Visit: Payer: Self-pay | Admitting: Physical Medicine & Rehabilitation

## 2017-11-29 DIAGNOSIS — G43711 Chronic migraine without aura, intractable, with status migrainosus: Secondary | ICD-10-CM

## 2017-11-29 DIAGNOSIS — Z5181 Encounter for therapeutic drug level monitoring: Secondary | ICD-10-CM

## 2017-11-29 DIAGNOSIS — M47812 Spondylosis without myelopathy or radiculopathy, cervical region: Secondary | ICD-10-CM

## 2017-11-29 DIAGNOSIS — M7918 Myalgia, other site: Secondary | ICD-10-CM

## 2017-11-29 DIAGNOSIS — Z79899 Other long term (current) drug therapy: Secondary | ICD-10-CM

## 2017-11-29 DIAGNOSIS — R51 Headache: Secondary | ICD-10-CM

## 2017-11-29 DIAGNOSIS — G894 Chronic pain syndrome: Secondary | ICD-10-CM

## 2017-11-29 DIAGNOSIS — G43011 Migraine without aura, intractable, with status migrainosus: Secondary | ICD-10-CM

## 2017-11-29 DIAGNOSIS — G4486 Cervicogenic headache: Secondary | ICD-10-CM

## 2017-11-29 DIAGNOSIS — Z79891 Long term (current) use of opiate analgesic: Secondary | ICD-10-CM

## 2017-12-17 ENCOUNTER — Encounter: Payer: Self-pay | Admitting: Physical Medicine & Rehabilitation

## 2017-12-17 ENCOUNTER — Encounter
Payer: Worker's Compensation | Attending: Physical Medicine & Rehabilitation | Admitting: Physical Medicine & Rehabilitation

## 2017-12-17 VITALS — BP 118/68 | HR 57 | Resp 14 | Ht 64.0 in | Wt 149.0 lb

## 2017-12-17 DIAGNOSIS — E119 Type 2 diabetes mellitus without complications: Secondary | ICD-10-CM | POA: Insufficient documentation

## 2017-12-17 DIAGNOSIS — H409 Unspecified glaucoma: Secondary | ICD-10-CM | POA: Insufficient documentation

## 2017-12-17 DIAGNOSIS — Z79891 Long term (current) use of opiate analgesic: Secondary | ICD-10-CM | POA: Insufficient documentation

## 2017-12-17 DIAGNOSIS — M1288 Other specific arthropathies, not elsewhere classified, other specified site: Secondary | ICD-10-CM | POA: Insufficient documentation

## 2017-12-17 DIAGNOSIS — M7918 Myalgia, other site: Secondary | ICD-10-CM | POA: Diagnosis not present

## 2017-12-17 DIAGNOSIS — G43919 Migraine, unspecified, intractable, without status migrainosus: Secondary | ICD-10-CM | POA: Insufficient documentation

## 2017-12-17 DIAGNOSIS — M542 Cervicalgia: Secondary | ICD-10-CM | POA: Insufficient documentation

## 2017-12-17 DIAGNOSIS — R51 Headache: Secondary | ICD-10-CM

## 2017-12-17 DIAGNOSIS — Z5181 Encounter for therapeutic drug level monitoring: Secondary | ICD-10-CM | POA: Diagnosis not present

## 2017-12-17 DIAGNOSIS — G43011 Migraine without aura, intractable, with status migrainosus: Secondary | ICD-10-CM | POA: Diagnosis not present

## 2017-12-17 DIAGNOSIS — G43711 Chronic migraine without aura, intractable, with status migrainosus: Secondary | ICD-10-CM | POA: Diagnosis not present

## 2017-12-17 DIAGNOSIS — G4486 Cervicogenic headache: Secondary | ICD-10-CM

## 2017-12-17 DIAGNOSIS — F4310 Post-traumatic stress disorder, unspecified: Secondary | ICD-10-CM | POA: Insufficient documentation

## 2017-12-17 DIAGNOSIS — Z9841 Cataract extraction status, right eye: Secondary | ICD-10-CM | POA: Diagnosis not present

## 2017-12-17 DIAGNOSIS — Z87442 Personal history of urinary calculi: Secondary | ICD-10-CM | POA: Insufficient documentation

## 2017-12-17 DIAGNOSIS — G8929 Other chronic pain: Secondary | ICD-10-CM | POA: Insufficient documentation

## 2017-12-17 DIAGNOSIS — Z961 Presence of intraocular lens: Secondary | ICD-10-CM | POA: Diagnosis not present

## 2017-12-17 DIAGNOSIS — M5481 Occipital neuralgia: Secondary | ICD-10-CM | POA: Insufficient documentation

## 2017-12-17 DIAGNOSIS — Z800 Family history of malignant neoplasm of digestive organs: Secondary | ICD-10-CM | POA: Insufficient documentation

## 2017-12-17 DIAGNOSIS — Z9842 Cataract extraction status, left eye: Secondary | ICD-10-CM | POA: Insufficient documentation

## 2017-12-17 DIAGNOSIS — Z794 Long term (current) use of insulin: Secondary | ICD-10-CM | POA: Insufficient documentation

## 2017-12-17 DIAGNOSIS — G894 Chronic pain syndrome: Secondary | ICD-10-CM | POA: Diagnosis not present

## 2017-12-17 MED ORDER — METHADONE HCL 10 MG PO TABS: 20.0000 mg | ORAL_TABLET | Freq: Three times a day (TID) | ORAL | 0 refills | Status: DC

## 2017-12-17 MED ORDER — MEPERIDINE HCL 50 MG PO TABS: 50.0000 mg | ORAL_TABLET | Freq: Two times a day (BID) | ORAL | 0 refills | Status: DC | PRN

## 2017-12-17 MED ORDER — TIZANIDINE HCL 2 MG PO TABS: 2.0000 mg | ORAL_TABLET | Freq: Three times a day (TID) | ORAL | 3 refills | Status: DC | PRN

## 2017-12-17 NOTE — Progress Notes (Signed)
Subjective:    Patient ID: Monica [name], female    DOB: May 31, 1951, 67 y.o.   MRN: [id number]  HPI   Monica [name] is here in follow up of her chronic pain. She states that her headaches have been worse over the last two months. She was unable to acquire the aimovig because WC denied stating she "didn't have migraines."  She continues to have severe pain in her neck as well.   Her pain has been limiting ADL's and basic activities around the house.   She continues on skelaxin for muscle spasms but she doesn't feel that it's doing much for her anymore.   She remains on methadone and dilaudid for breakthrough pain. She no longer feels that he dilaudid is beneficial either and thinks that demerol was more effective in controlling her pain.    Pain Inventory Average Pain 6 Pain Right Now 6 My pain is constant, sharp, burning, stabbing, tingling and aching  In the last 24 hours, has pain interfered with the following? General activity 9 Relation with others 10 Enjoyment of life 9 What TIME of day is your pain at its worst? daytime Sleep (in general) Fair  Pain is worse with: bending and some activites Pain improves with: rest, heat/ice, pacing activities and medication Relief from Meds: 6  Mobility walk without assistance do you drive?  yes Do you have any goals in this area?  no  Function Do you have any goals in this area?  no  Neuro/Psych dizziness depression  Prior Studies Any changes since last visit?  no  Physicians involved in your care     Family History  Problem Relation Age of Onset  . Colon cancer Mother 37   Social History   Socioeconomic History  . Marital status: Married    Spouse name: Not on file  . Number of children: 3  . Years of education: Not on file  . Highest education level: Not on file  Occupational History  . Occupation: disabled    Associate Professor: UNEMPLOYED  Social Needs  . Financial resource strain: Not on file  . Food insecurity:   Worry: Not on file    Inability: Not on file  . Transportation needs:    Medical: Not on file    Non-medical: Not on file  Tobacco Use  . Smoking status: Never Smoker  . Smokeless tobacco: Never Used  Substance and Sexual Activity  . Alcohol use: No  . Drug use: No  . Sexual activity: Yes    Birth control/protection: Post-menopausal  Lifestyle  . Physical activity:    Days per week: Not on file    Minutes per session: Not on file  . Stress: Not on file  Relationships  . Social connections:    Talks on phone: Not on file    Gets together: Not on file    Attends religious service: Not on file    Active member of club or organization: Not on file    Attends meetings of clubs or organizations: Not on file    Relationship status: Not on file  Other Topics Concern  . Not on file  Social History Narrative  . Not on file   Past Surgical History:  Procedure Laterality Date  . BREAST LUMPECTOMY     left  . CATARACT EXTRACTION W/PHACO Left 10/07/2016   Procedure: CATARACT EXTRACTION PHACO AND INTRAOCULAR LENS PLACEMENT (IOC);  Surgeon: Gemma Payor, MD;  Location: [facility];  Service: Ophthalmology;  Laterality: Left;  CDE: 4.98  . CATARACT EXTRACTION W/PHACO Right 10/21/2016   Procedure: CATARACT EXTRACTION PHACO AND INTRAOCULAR LENS PLACEMENT RIGHT EYE CDE - 6.24;  Surgeon: Gemma Payor, MD;  Location: [facility];  Service: Ophthalmology;  Laterality: Right;  right  . CHOLECYSTECTOMY    . COLONOSCOPY  11/17/08   ext hemorrhoids  . CRANIOTOMY     secondary TBI  . ESOPHAGOGASTRODUODENOSCOPY  04/05/10   [name] (101F),erosive reflux esophagitis, small hiatal hernia,  . FINGER SURGERY     removal cyst left pinky  . gastro empy study  03/03/10   normal  . HAND SURGERY    . NECK SURGERY  1998  . NEPHROLITHOTOMY  05/13/2012   Procedure: NEPHROLITHOTOMY PERCUTANEOUS;  Surgeon: Marcine Matar, MD;  Location: [facility];  Service: Urology;  Laterality: Left;      Past Medical History:    Diagnosis Date  . Arthritis   . Cervical facet joint syndrome 04/22/2012  . Cervicalgia   . Chronic headaches    Dr Hermelinda Medicus [place] [facility]  . Chronic nausea    normal GES   . Complication of anesthesia    had problem with local anesthesia-tried to assist Physician  . DM (diabetes mellitus) (HCC)   . Elevated liver enzymes    hx of, normal 03/2010  . Glaucoma   . Headache(784.0)   . Hemorrhoids 11/17/08   Colonoscopy Dr Karilyn Cota  . Migraine without aura, with intractable migraine, so stated, without mention of status migrainosus   . Occipital neuralgia   . Posttraumatic stress disorder   . PTSD (post-traumatic stress disorder)   . Renal lithiasis   . 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 Torticollis   . Torticollis   . Unspecified musculoskeletal disorders and symptoms referable to neck    cervical/trapezius   BP 118/68 (BP Location: Left Arm, Patient Position: Sitting, Cuff Size: Normal)   Pulse (!) 57   Resp 14   Ht  (1.626 m)   Wt 149 lb (67.6 kg)   SpO2 92%   BMI 25.58 kg/m   Opioid Risk Score:   Fall Risk Score:  `1  Depression screen PHQ 2/9  Depression screen [facility] 2/9 10/15/2016 04/10/2016 04/04/2016 11/06/2015 10/19/2015 10/18/2015 10/02/2015  Decreased Interest 0 0 0 0  Down, Depressed, Hopeless 0 0 0 0  PHQ - 2 Score 0 0 0 0  Altered sleeping - - 3 - - - -  Tired, decreased energy - - 3 - - - -  Change in appetite - - 3 - - - -  Feeling bad or failure about yourself  - - 0 - - - -  Trouble concentrating - - 3 - - - -  Moving slowly or fidgety/restless - - 3 - - - -  Suicidal thoughts - - 0 - - - -  PHQ-9 Score - - 21 - - - -  Difficult doing work/chores - - Not difficult at all - - - -  Some recent data might be hidden    Review of Systems  Constitutional: Negative.   HENT: Negative.   Eyes: Negative.   Respiratory: Positive for shortness of breath.   Cardiovascular: Negative.   Gastrointestinal: Negative.   Endocrine:  Negative.   Musculoskeletal: Positive for arthralgias.  Skin: Negative.   Allergic/Immunologic: Negative.   Neurological: Positive for dizziness and headaches.  Hematological: Negative.   Psychiatric/Behavioral: Negative.   All other  systems reviewed and are negative.      Objective:   Physical Exam General: No acute distress HEENT: EOMI, oral membranes moist Cards: reg rate  Chest: normal effort Abdomen: Soft, NT, ND Skin: dry, intact Extremities: no edema Neuro: Fine motor movements are normal in both hands. No ptosis seen Sensory exam. occipital and cervical muscles TTP DTR are 2+. No clonus is noted.  Patient balance stable/good Musc: head forward position. Tenderness with flexion and extension as well as rotation right more than left.  Cervical spine tender. Upper left trap is taut/tender also Psych: flat but cooperative   Assessment & Plan:   Assessment:  1. 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  2. Posttraumatic stress syndrome.  3. Greater occipital neuralgia (bilateral) confirmed by response to recent nerve block  4. DM 2- now on insulin   PLAN:  1. Continue methadone rx.RF today. Second rx was provided -We will continue the controlled substance monitoring program, this consists of regular clinic visits, examinations, routine drug screening, pill counts as well as use of [place] Controlled Substance Reporting System. [place] today.     -Consider decrease at some point of methadone to 20-10-20 schedulethis spring. -resume demerol  in place of hydromorphone as she feels she had better results with this.  -discussed impact of mood on pain. 2.  History of recent greater occipital nerve RF with transient effects.  3. Continue topamax.  -sleep can be an issue -did not  tolerate trokendi 4.Consider botox injections again 5. Would like to initiate a trial of aimovig /ml monthly injection. She has exhausted other treatment modalities for her migraines which have been documented in her chart.  6.Trial of zanaflex  q6 prn in place of skelaxin to help address cervical and myofascial component to pain.  7. 15  minutesof face to face patient care time were spent during this visit. All questions were encouraged and answered. I'll see her back in about2 months.            Assessment & Plan:

## 2017-12-25 ENCOUNTER — Telehealth: Payer: Self-pay | Admitting: *Deleted

## 2017-12-25 NOTE — Telephone Encounter (Signed)
Urine drug screen for this encounter is consistent for prescribed medication 

## 2017-12-26 LAB — TOXASSURE SELECT,+ANTIDEPR,UR

## 2018-02-18 ENCOUNTER — Encounter
Payer: Worker's Compensation | Attending: Physical Medicine & Rehabilitation | Admitting: Physical Medicine & Rehabilitation

## 2018-02-18 ENCOUNTER — Encounter: Payer: Self-pay | Admitting: Physical Medicine & Rehabilitation

## 2018-02-18 DIAGNOSIS — Z9049 Acquired absence of other specified parts of digestive tract: Secondary | ICD-10-CM | POA: Diagnosis not present

## 2018-02-18 DIAGNOSIS — G4486 Cervicogenic headache: Secondary | ICD-10-CM

## 2018-02-18 DIAGNOSIS — G894 Chronic pain syndrome: Secondary | ICD-10-CM | POA: Diagnosis present

## 2018-02-18 DIAGNOSIS — Z794 Long term (current) use of insulin: Secondary | ICD-10-CM | POA: Diagnosis not present

## 2018-02-18 DIAGNOSIS — G43711 Chronic migraine without aura, intractable, with status migrainosus: Secondary | ICD-10-CM

## 2018-02-18 DIAGNOSIS — R51 Headache: Secondary | ICD-10-CM | POA: Diagnosis not present

## 2018-02-18 DIAGNOSIS — M5481 Occipital neuralgia: Secondary | ICD-10-CM | POA: Insufficient documentation

## 2018-02-18 DIAGNOSIS — M7918 Myalgia, other site: Secondary | ICD-10-CM | POA: Diagnosis not present

## 2018-02-18 DIAGNOSIS — Z9889 Other specified postprocedural states: Secondary | ICD-10-CM | POA: Diagnosis not present

## 2018-02-18 DIAGNOSIS — G43011 Migraine without aura, intractable, with status migrainosus: Secondary | ICD-10-CM | POA: Diagnosis not present

## 2018-02-18 DIAGNOSIS — E119 Type 2 diabetes mellitus without complications: Secondary | ICD-10-CM | POA: Insufficient documentation

## 2018-02-18 DIAGNOSIS — F4310 Post-traumatic stress disorder, unspecified: Secondary | ICD-10-CM | POA: Insufficient documentation

## 2018-02-18 MED ORDER — TIZANIDINE HCL 2 MG PO TABS: 2.0000 mg | ORAL_TABLET | Freq: Four times a day (QID) | ORAL | 3 refills | Status: DC | PRN

## 2018-02-18 MED ORDER — METHADONE HCL 10 MG PO TABS: 20.0000 mg | ORAL_TABLET | Freq: Three times a day (TID) | ORAL | 0 refills | Status: DC

## 2018-02-18 MED ORDER — MEPERIDINE HCL 50 MG PO TABS: 50.0000 mg | ORAL_TABLET | Freq: Two times a day (BID) | ORAL | 0 refills | Status: DC | PRN

## 2018-02-18 NOTE — Patient Instructions (Signed)
PLEASE FEEL FREE TO CALL OUR OFFICE WITH ANY PROBLEMS OR QUESTIONS [phone])     CONSIDER TAKING YOUR ZANALFEX FOR BREAKTHROUGH SPASMS /HEADACHES.  WE CAN ALSO CHANGE IT TO SCHEDULED DOSING.

## 2018-02-18 NOTE — Progress Notes (Signed)
Subjective:    Patient ID: Monica [name], female    DOB: Jul 13, 1951, 67 y.o.   MRN: [id number]  HPI   Monica [name] is here in follow-up of her chronic pain syndrome.  She had great results with the trial of Zanaflex.  She is using it once to twice daily and it really has impacted her cervical and shoulder girdle pain in a positive manner.  Headache frequency is down his results also.  Her Worker's Comp. would not approve the Aimovig, therefore she never started that.  She continues on her methadone and Demerol as well for headache control.  Given her Demerol use has decreased quite a bit.  She has found that she has been able to do more physically and with her family and friends.  People noted that she seems to be a different person over the last 6 to 8 weeks time.   Pain Inventory Average Pain 5 Pain Right Now 5 My pain is constant, sharp, burning, stabbing and aching  In the last 24 hours, has pain interfered with the following? General activity 0 Relation with others 0 Enjoyment of life 0 What TIME of day is your pain at its worst? evening Sleep (in general) Poor  Pain is worse with: bending and some activites Pain improves with: medication Relief from Meds: 4  Mobility walk without assistance  Function retired I need assistance with the following:  meal prep and household duties  Neuro/Psych dizziness  Prior Studies Any changes since last visit?  no  Physicians involved in your care Any changes since last visit?  no   Family History  Problem Relation Age of Onset  . Colon cancer Mother 13   Social History   Socioeconomic History  . Marital status: Married    Spouse name: Not on file  . Number of children: 3  . Years of education: Not on file  . Highest education level: Not on file  Occupational History  . Occupation: disabled    Associate Professor: UNEMPLOYED  Social Needs  . Financial resource strain: Not on file  . Food insecurity:    Worry: Not on file   Inability: Not on file  . Transportation needs:    Medical: Not on file    Non-medical: Not on file  Tobacco Use  . Smoking status: Never Smoker  . Smokeless tobacco: Never Used  Substance and Sexual Activity  . Alcohol use: No  . Drug use: No  . Sexual activity: Yes    Birth control/protection: Post-menopausal  Lifestyle  . Physical activity:    Days per week: Not on file    Minutes per session: Not on file  . Stress: Not on file  Relationships  . Social connections:    Talks on phone: Not on file    Gets together: Not on file    Attends religious service: Not on file    Active member of club or organization: Not on file    Attends meetings of clubs or organizations: Not on file    Relationship status: Not on file  Other Topics Concern  . Not on file  Social History Narrative  . Not on file   Past Surgical History:  Procedure Laterality Date  . BREAST LUMPECTOMY     left  . CATARACT EXTRACTION W/PHACO Left 10/07/2016   Procedure: CATARACT EXTRACTION PHACO AND INTRAOCULAR LENS PLACEMENT (IOC);  Surgeon: Gemma Payor, MD;  Location: [facility];  Service: Ophthalmology;  Laterality: Left;  CDE: 4.98  .  CATARACT EXTRACTION W/PHACO Right 10/21/2016   Procedure: CATARACT EXTRACTION PHACO AND INTRAOCULAR LENS PLACEMENT RIGHT EYE CDE - 6.24;  Surgeon: Gemma PayorKerry Hunt, MD;  Location: [facility];  Service: Ophthalmology;  Laterality: Right;  right  . CHOLECYSTECTOMY    . COLONOSCOPY  11/17/08   ext hemorrhoids  . CRANIOTOMY     secondary TBI  . ESOPHAGOGASTRODUODENOSCOPY  04/05/10   [name] (88F),erosive reflux esophagitis, small hiatal hernia,  . FINGER SURGERY     removal cyst left pinky  . gastro empy study  03/03/10   normal  . HAND SURGERY    . NECK SURGERY  1998  . NEPHROLITHOTOMY  05/13/2012   Procedure: NEPHROLITHOTOMY PERCUTANEOUS;  Surgeon: Marcine MatarStephen Dahlstedt, MD;  Location: [facility];  Service: Urology;  Laterality: Left;      Past Medical History:  Diagnosis Date  .  Arthritis   . Cervical facet joint syndrome 04/22/2012  . Cervicalgia   . Chronic headaches    Dr Hermelinda MedicusSchwartz [place] [facility]ist  . Chronic nausea    normal GES   . Complication of anesthesia    had problem with local anesthesia-tried to assist Physician  . DM (diabetes mellitus) (HCC)   . Elevated liver enzymes    hx of, normal 03/2010  . Glaucoma   . Headache(784.0)   . Hemorrhoids 11/17/08   Colonoscopy Dr Karilyn Cotaehman  . Migraine without aura, with intractable migraine, so stated, without mention of status migrainosus   . Occipital neuralgia   . Posttraumatic stress disorder   . PTSD (post-traumatic stress disorder)   . Renal lithiasis   . 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 Torticollis   . Torticollis   . Unspecified musculoskeletal disorders and symptoms referable to neck    cervical/trapezius   BP 133/76   Pulse 61   Resp 14   Ht 5\' 4"  (1.626 m)   Wt 144 lb (65.3 kg)   SpO2 93%   BMI 24.72 kg/m   Opioid Risk Score:   Fall Risk Score:  `1  Depression screen PHQ 2/9  [facility] 10/15/2016 04/10/2016 04/04/2016 11/06/2015 10/19/2015 10/18/2015 10/02/2015  Decreased Interest 3 3 3  0 0 0 0  Down, Depressed, Hopeless 3 3 3  0 0 0 0  PHQ - 2 Score 6 6 6  0 0 0 0  Altered sleeping - - 3 - - - -  Tired, decreased energy - - 3 - - - -  Change in appetite - - 3 - - - -  Feeling bad or failure about yourself  - - 0 - - - -  Trouble concentrating - - 3 - - - -  Moving slowly or fidgety/restless - - 3 - - - -  Suicidal thoughts - - 0 - - - -  PHQ-9 Score - - 21 - - - -  Difficult doing work/chores - - Not difficult at all - - - -  Some recent data might be hidden    Review of Systems  Constitutional: Negative.   HENT: Negative.   Eyes: Positive for photophobia.  Respiratory: Negative.   Cardiovascular: Negative.   Gastrointestinal: Negative.   Endocrine: Negative.   Genitourinary: Negative.   Musculoskeletal: Positive for arthralgias and neck pain.  Skin:  Negative.   Allergic/Immunologic: Negative.   Neurological: Positive for headaches.  Hematological: Negative.   Psychiatric/Behavioral: Negative.   All other systems reviewed and are negative.      Objective:   Physical  Exam General: No acute distress HEENT: EOMI, oral membranes moist Cards: reg rate  Chest: normal effort Abdomen: Soft, NT, ND Skin: dry, intact Extremities: no edema Neuro: Fine motor movements are normal in both hands.No ptosis seen Sensory exam. occipital and cervical muscles TTP DTR are 2+. No clonus is noted.  Patient balance stable/good Musc: head forward position. Tenderness with flexion and extension as well as rotation right more than left.  Cervical spine tender. Upper left trap is taut/tender also Psych:flat but cooperative   Assessment & Plan:   Assessment:  1. 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  2. Posttraumatic stress syndrome.  3. Greater occipital neuralgia (bilateral) confirmed by response to recent nerve block  4. DM 2- now on insulin   PLAN:  1. Continue methadone rx.RF today. Second rx was provided #180 10mg  tablets -We will continue the controlled substance monitoring program, this consists of regular clinic visits, examinations, routine drug screening, pill counts as well as use of [place] Controlled Substance Reporting System. [place] today.     -Consider decrease at some point ofmethadone to 20-10-20 schedule -maintain demerol 50mg  for breakthrough headahce, reduce to #90.   2.History of recentgreater occipital nerve RF with transient effects.  3. Continue topamax.  -sleep an issue -did not tolerate trokendi 4.Consider botox injections again 5.Retrial of aimovig when possible per  W/C 6. Continue zanaflex 2mg  q6 prn for spasm.  Increase to #90.  Would like her to take this more frequently for breakthrough pain and headaches.  I would definitely prefer her using this as opposed to Demerol for breakthrough symptoms.  Consider scheduling at some point.   7. face to face patient care time were spent during this visit. All questions were encouraged and answered. I'll see her back in about2 months.

## 2018-03-03 ENCOUNTER — Other Ambulatory Visit: Payer: Self-pay | Admitting: Physical Medicine & Rehabilitation

## 2018-03-03 DIAGNOSIS — Z79899 Other long term (current) drug therapy: Secondary | ICD-10-CM

## 2018-03-03 DIAGNOSIS — G894 Chronic pain syndrome: Secondary | ICD-10-CM

## 2018-03-03 DIAGNOSIS — R51 Headache: Secondary | ICD-10-CM

## 2018-03-03 DIAGNOSIS — Z79891 Long term (current) use of opiate analgesic: Secondary | ICD-10-CM

## 2018-03-03 DIAGNOSIS — Z5181 Encounter for therapeutic drug level monitoring: Secondary | ICD-10-CM

## 2018-03-03 DIAGNOSIS — G43711 Chronic migraine without aura, intractable, with status migrainosus: Secondary | ICD-10-CM

## 2018-03-03 DIAGNOSIS — G43011 Migraine without aura, intractable, with status migrainosus: Secondary | ICD-10-CM

## 2018-03-03 DIAGNOSIS — G4486 Cervicogenic headache: Secondary | ICD-10-CM

## 2018-03-03 DIAGNOSIS — M47812 Spondylosis without myelopathy or radiculopathy, cervical region: Secondary | ICD-10-CM

## 2018-03-03 DIAGNOSIS — M7918 Myalgia, other site: Secondary | ICD-10-CM

## 2018-04-22 ENCOUNTER — Encounter: Payer: Self-pay | Admitting: Physical Medicine & Rehabilitation

## 2018-04-22 ENCOUNTER — Other Ambulatory Visit: Payer: Self-pay

## 2018-04-22 ENCOUNTER — Encounter
Payer: Worker's Compensation | Attending: Physical Medicine & Rehabilitation | Admitting: Physical Medicine & Rehabilitation

## 2018-04-22 VITALS — BP 164/99 | HR 70 | Ht 64.0 in | Wt 144.0 lb

## 2018-04-22 DIAGNOSIS — M542 Cervicalgia: Secondary | ICD-10-CM | POA: Insufficient documentation

## 2018-04-22 DIAGNOSIS — G43011 Migraine without aura, intractable, with status migrainosus: Secondary | ICD-10-CM | POA: Diagnosis not present

## 2018-04-22 DIAGNOSIS — R51 Headache: Secondary | ICD-10-CM

## 2018-04-22 DIAGNOSIS — G43711 Chronic migraine without aura, intractable, with status migrainosus: Secondary | ICD-10-CM | POA: Diagnosis not present

## 2018-04-22 DIAGNOSIS — G8929 Other chronic pain: Secondary | ICD-10-CM | POA: Diagnosis not present

## 2018-04-22 DIAGNOSIS — M1990 Unspecified osteoarthritis, unspecified site: Secondary | ICD-10-CM | POA: Insufficient documentation

## 2018-04-22 DIAGNOSIS — F4310 Post-traumatic stress disorder, unspecified: Secondary | ICD-10-CM | POA: Diagnosis not present

## 2018-04-22 DIAGNOSIS — G894 Chronic pain syndrome: Secondary | ICD-10-CM | POA: Diagnosis not present

## 2018-04-22 DIAGNOSIS — E119 Type 2 diabetes mellitus without complications: Secondary | ICD-10-CM | POA: Insufficient documentation

## 2018-04-22 DIAGNOSIS — H409 Unspecified glaucoma: Secondary | ICD-10-CM | POA: Insufficient documentation

## 2018-04-22 DIAGNOSIS — G43919 Migraine, unspecified, intractable, without status migrainosus: Secondary | ICD-10-CM | POA: Insufficient documentation

## 2018-04-22 DIAGNOSIS — M5481 Occipital neuralgia: Secondary | ICD-10-CM | POA: Insufficient documentation

## 2018-04-22 DIAGNOSIS — G4486 Cervicogenic headache: Secondary | ICD-10-CM

## 2018-04-22 MED ORDER — METHADONE HCL 10 MG PO TABS: 20.0000 mg | ORAL_TABLET | Freq: Three times a day (TID) | ORAL | 0 refills | Status: DC

## 2018-04-22 MED ORDER — MEPERIDINE HCL 50 MG PO TABS: 50.0000 mg | ORAL_TABLET | Freq: Two times a day (BID) | ORAL | 0 refills | Status: DC | PRN

## 2018-04-22 NOTE — Progress Notes (Signed)
Subjective:    Patient ID: Monica [name], female    DOB: Mar 14, 1951, 67 y.o.   MRN: [id number]  HPI   Kadejah is here in follow up of her chronic pain. She does not have insurance covg for [employer]. WC will not cover. She hasn't had a good 2 months since I last saw her.  She tried taking the Zanaflex more frequently but found that the increased dosing affected the efficacy.  She is gone back to using it essentially in the evenings only.  She continues on methadone 20 mg 3 times daily with her Demerol for breakthrough pain.  Pain remains limiting her functional activities at home as well as leisure activities.  5 days she is unable to leave the house.  Pain Inventory Average Pain 8 Pain Right Now 8 My pain is constant, sharp, stabbing and aching  In the last 24 hours, has pain interfered with the following? General activity 10 Relation with others 10 Enjoyment of life 10 What TIME of day is your pain at its worst? varies Sleep (in general) Poor  Pain is worse with: walking, bending and some activites Pain improves with: rest, heat/ice and medication Relief from Meds: 6  Mobility Do you have any goals in this area?  no  Function Do you have any goals in this area?  no  Neuro/Psych dizziness confusion loss of taste or smell  Prior Studies Any changes since last visit?  no  Physicians involved in your care Any changes since last visit?  no   Family History  Problem Relation Age of Onset  . Colon cancer Mother 19   Social History   Socioeconomic History  . Marital status: Married    Spouse name: Not on file  . Number of children: 3  . Years of education: Not on file  . Highest education level: Not on file  Occupational History  . Occupation: disabled    Associate Professor: UNEMPLOYED  Social Needs  . Financial resource strain: Not on file  . Food insecurity:    Worry: Not on file    Inability: Not on file  . Transportation needs:    Medical: Not on file   Non-medical: Not on file  Tobacco Use  . Smoking status: Never Smoker  . Smokeless tobacco: Never Used  Substance and Sexual Activity  . Alcohol use: No  . Drug use: No  . Sexual activity: Yes    Birth control/protection: Post-menopausal  Lifestyle  . Physical activity:    Days per week: Not on file    Minutes per session: Not on file  . Stress: Not on file  Relationships  . Social connections:    Talks on phone: Not on file    Gets together: Not on file    Attends religious service: Not on file    Active member of club or organization: Not on file    Attends meetings of clubs or organizations: Not on file    Relationship status: Not on file  Other Topics Concern  . Not on file  Social History Narrative  . Not on file   Past Surgical History:  Procedure Laterality Date  . BREAST LUMPECTOMY     left  . CATARACT EXTRACTION W/PHACO Left 10/07/2016   Procedure: CATARACT EXTRACTION PHACO AND INTRAOCULAR LENS PLACEMENT (IOC);  Surgeon: Gemma Payor, MD;  Location: [facility];  Service: Ophthalmology;  Laterality: Left;  CDE: 4.98  . CATARACT EXTRACTION W/PHACO Right 10/21/2016   Procedure: CATARACT EXTRACTION PHACO  AND INTRAOCULAR LENS PLACEMENT RIGHT EYE CDE - 6.24;  Surgeon: Gemma Payor, MD;  Location: [facility];  Service: Ophthalmology;  Laterality: Right;  right  . CHOLECYSTECTOMY    . COLONOSCOPY  11/17/08   ext hemorrhoids  . CRANIOTOMY     secondary TBI  . ESOPHAGOGASTRODUODENOSCOPY  04/05/10   [name] (29F),erosive reflux esophagitis, small hiatal hernia,  . FINGER SURGERY     removal cyst left pinky  . gastro empy study  03/03/10   normal  . HAND SURGERY    . NECK SURGERY  1998  . NEPHROLITHOTOMY  05/13/2012   Procedure: NEPHROLITHOTOMY PERCUTANEOUS;  Surgeon: Marcine Matar, MD;  Location: [facility];  Service: Urology;  Laterality: Left;      Past Medical History:  Diagnosis Date  . Arthritis   . Cervical facet joint syndrome 04/22/2012  . Cervicalgia   .  Chronic headaches    Dr Hermelinda Medicus [place] [facility]  . Chronic nausea    normal GES   . Complication of anesthesia    had problem with local anesthesia-tried to assist Physician  . DM (diabetes mellitus) (HCC)   . Elevated liver enzymes    hx of, normal 03/2010  . Glaucoma   . Headache(784.0)   . Hemorrhoids 11/17/08   Colonoscopy Dr Karilyn Cota  . Migraine without aura, with intractable migraine, so stated, without mention of status migrainosus   . Occipital neuralgia   . Posttraumatic stress disorder   . PTSD (post-traumatic stress disorder)   . Renal lithiasis   . 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 Torticollis   . Torticollis   . Unspecified musculoskeletal disorders and symptoms referable to neck    cervical/trapezius   BP (!) 164/99   Pulse 70   Ht 5\' 4"  (1.626 m)   Wt 144 lb (65.3 kg)   SpO2 95%   BMI 24.72 kg/m   Opioid Risk Score:   Fall Risk Score:  `1  Depression screen PHQ 2/9  Depression screen [facility] 2/9 04/22/2018 10/15/2016 04/10/2016 04/04/2016 11/06/2015 10/19/2015 10/18/2015  Decreased Interest 0 3 3 3  0 0 0  Down, Depressed, Hopeless 0 3 3 3  0 0 0  PHQ - 2 Score 0 6 6 6  0 0 0  Altered sleeping - - - 3 - - -  Tired, decreased energy - - - 3 - - -  Change in appetite - - - 3 - - -  Feeling bad or failure about yourself  - - - 0 - - -  Trouble concentrating - - - 3 - - -  Moving slowly or fidgety/restless - - - 3 - - -  Suicidal thoughts - - - 0 - - -  PHQ-9 Score - - - 21 - - -  Difficult doing work/chores - - - Not difficult at all - - -  Some recent data might be hidden    Review of Systems  Constitutional: Negative.   HENT: Negative.   Eyes: Negative.   Respiratory: Positive for shortness of breath.   Cardiovascular: Negative.   Gastrointestinal: Negative.   Endocrine: Negative.   Genitourinary: Negative.   Musculoskeletal: Negative.   Skin: Negative.   Allergic/Immunologic: Negative.   Neurological: Negative.   Hematological: Negative.     Psychiatric/Behavioral: Negative.   All other systems reviewed and are negative.      Objective:   Physical Exam General: No acute distress HEENT: EOMI, oral membranes moist Cards: reg rate  Chest:  normal effort Abdomen: Soft, NT, ND Skin: dry, intact Extremities: no edema  Neuro: Fine motor movements are normal in both hands.No ptosis seen Sensory exam. occipital and cervical muscles TTP DTR are 2+. No clonus is noted.  Patient balance stable/good Musc: cervical and suboccipital tenderness Psych:flat but cooperative   Assessment & Plan:   Assessment:  1. 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  2. Posttraumatic stress syndrome.  3. Greater occipital neuralgia (bilateral) confirmed by response to recent nerve block  4. DM 2-   PLAN:  1. Continue methadone rx.RF today. Second rx was provided #180 10mg  tablets -We will continue the controlled substance monitoring program, this consists of regular clinic visits, examinations, routine drug screening, pill counts as well as use of [place] Controlled Substance Reporting System. [place] today.   -She will try to decrease  methadone to 20-10-20 schedule. Still wrote for #90, however.  -maintain demerol 50mg  for breakthrough headahce, reduce to #90.   2.History of recentgreater occipital nerve RF with transient effects.  3. Continue topamax.  -sleep an issue -did not tolerate trokendi 4.Consider botox injectionsagain 5.Retrial of aimovig if WC on board 6. Continue zanaflex 2mg  q6 prn for spasm.  using mostly in the evenings. Continue with current schedule.   7.37minutesof face to face patient care time were spent during this visit. All questions were encouraged and answered. I'll see her back in about78months.

## 2018-04-22 NOTE — Patient Instructions (Signed)
PLEASE FEEL FREE TO CALL OUR OFFICE WITH ANY PROBLEMS OR QUESTIONS ([phone])      

## 2018-05-04 ENCOUNTER — Other Ambulatory Visit: Payer: Self-pay | Admitting: Physical Medicine & Rehabilitation

## 2018-05-04 DIAGNOSIS — G43711 Chronic migraine without aura, intractable, with status migrainosus: Secondary | ICD-10-CM

## 2018-05-04 DIAGNOSIS — G4486 Cervicogenic headache: Secondary | ICD-10-CM

## 2018-05-04 DIAGNOSIS — G43011 Migraine without aura, intractable, with status migrainosus: Secondary | ICD-10-CM

## 2018-05-04 DIAGNOSIS — G894 Chronic pain syndrome: Secondary | ICD-10-CM

## 2018-05-04 DIAGNOSIS — Z5181 Encounter for therapeutic drug level monitoring: Secondary | ICD-10-CM

## 2018-05-04 DIAGNOSIS — R51 Headache: Secondary | ICD-10-CM

## 2018-05-04 DIAGNOSIS — Z79891 Long term (current) use of opiate analgesic: Secondary | ICD-10-CM

## 2018-05-04 DIAGNOSIS — M47812 Spondylosis without myelopathy or radiculopathy, cervical region: Secondary | ICD-10-CM

## 2018-05-04 DIAGNOSIS — M7918 Myalgia, other site: Secondary | ICD-10-CM

## 2018-05-04 DIAGNOSIS — Z79899 Other long term (current) drug therapy: Secondary | ICD-10-CM

## 2018-06-16 ENCOUNTER — Encounter
Payer: Worker's Compensation | Attending: Physical Medicine & Rehabilitation | Admitting: Physical Medicine & Rehabilitation

## 2018-06-16 ENCOUNTER — Encounter: Payer: Self-pay | Admitting: Physical Medicine & Rehabilitation

## 2018-06-16 VITALS — BP 132/72 | HR 86 | Ht 64.0 in | Wt 146.8 lb

## 2018-06-16 DIAGNOSIS — Z9889 Other specified postprocedural states: Secondary | ICD-10-CM | POA: Diagnosis not present

## 2018-06-16 DIAGNOSIS — G43919 Migraine, unspecified, intractable, without status migrainosus: Secondary | ICD-10-CM | POA: Insufficient documentation

## 2018-06-16 DIAGNOSIS — G43711 Chronic migraine without aura, intractable, with status migrainosus: Secondary | ICD-10-CM

## 2018-06-16 DIAGNOSIS — M7918 Myalgia, other site: Secondary | ICD-10-CM | POA: Diagnosis not present

## 2018-06-16 DIAGNOSIS — M5481 Occipital neuralgia: Secondary | ICD-10-CM | POA: Insufficient documentation

## 2018-06-16 DIAGNOSIS — Z9049 Acquired absence of other specified parts of digestive tract: Secondary | ICD-10-CM | POA: Diagnosis not present

## 2018-06-16 DIAGNOSIS — G8929 Other chronic pain: Secondary | ICD-10-CM | POA: Insufficient documentation

## 2018-06-16 DIAGNOSIS — M542 Cervicalgia: Secondary | ICD-10-CM | POA: Diagnosis not present

## 2018-06-16 DIAGNOSIS — E119 Type 2 diabetes mellitus without complications: Secondary | ICD-10-CM | POA: Insufficient documentation

## 2018-06-16 DIAGNOSIS — R51 Headache: Secondary | ICD-10-CM

## 2018-06-16 DIAGNOSIS — F4310 Post-traumatic stress disorder, unspecified: Secondary | ICD-10-CM | POA: Diagnosis not present

## 2018-06-16 DIAGNOSIS — G4486 Cervicogenic headache: Secondary | ICD-10-CM

## 2018-06-16 DIAGNOSIS — Z79899 Other long term (current) drug therapy: Secondary | ICD-10-CM | POA: Insufficient documentation

## 2018-06-16 DIAGNOSIS — G43011 Migraine without aura, intractable, with status migrainosus: Secondary | ICD-10-CM | POA: Diagnosis not present

## 2018-06-16 MED ORDER — METHADONE HCL 10 MG PO TABS: 20.0000 mg | ORAL_TABLET | Freq: Three times a day (TID) | ORAL | 0 refills | Status: DC

## 2018-06-16 MED ORDER — HYDROMORPHONE HCL 2 MG PO TABS: 2.0000 mg | ORAL_TABLET | Freq: Three times a day (TID) | ORAL | 0 refills | Status: DC | PRN

## 2018-06-16 NOTE — Progress Notes (Signed)
Subjective:    Patient ID: Monica [name], female    DOB: 09-13-1950, 67 y.o.   MRN: [id number]  HPI  Monica [name] is here in follow up of her chronic pain. She went down to 20-10-20 for a period of about a month, and her pain increased substantially as a result. She also started doing some weird things.  Now she reports that headaches are worse than ever.  She does not know how she can keep ongoing as things are right now.  She is also concerned about her Demerol which is no longer going to be available.     Pain Inventory Average Pain 10 Pain Right Now 7 My pain is constant, sharp, stabbing and aching  In the last 24 hours, has pain interfered with the following? General activity 0 Relation with others 0 Enjoyment of life 0 What TIME of day is your pain at its worst? all Sleep (in general) Poor  Pain is worse with: bending and some activites Pain improves with: medication Relief from Meds: 4  Mobility Do you have any goals in this area?  no  Function Do you have any goals in this area?  no  Neuro/Psych tingling dizziness anxiety  Prior Studies Any changes since last visit?  no  Physicians involved in your care Any changes since last visit?  no   Family History  Problem Relation Age of Onset  . Colon cancer Mother 64   Social History   Socioeconomic History  . Marital status: Married    Spouse name: Not on file  . Number of children: 3  . Years of education: Not on file  . Highest education level: Not on file  Occupational History  . Occupation: disabled    Associate Professor: UNEMPLOYED  Social Needs  . Financial resource strain: Not on file  . Food insecurity:    Worry: Not on file    Inability: Not on file  . Transportation needs:    Medical: Not on file    Non-medical: Not on file  Tobacco Use  . Smoking status: Never Smoker  . Smokeless tobacco: Never Used  Substance and Sexual Activity  . Alcohol use: No  . Drug use: No  . Sexual activity: Yes   Birth control/protection: Post-menopausal  Lifestyle  . Physical activity:    Days per week: Not on file    Minutes per session: Not on file  . Stress: Not on file  Relationships  . Social connections:    Talks on phone: Not on file    Gets together: Not on file    Attends religious service: Not on file    Active member of club or organization: Not on file    Attends meetings of clubs or organizations: Not on file    Relationship status: Not on file  Other Topics Concern  . Not on file  Social History Narrative  . Not on file   Past Surgical History:  Procedure Laterality Date  . BREAST LUMPECTOMY     left  . CATARACT EXTRACTION W/PHACO Left 10/07/2016   Procedure: CATARACT EXTRACTION PHACO AND INTRAOCULAR LENS PLACEMENT (IOC);  Surgeon: Gemma Payor, MD;  Location: [facility];  Service: Ophthalmology;  Laterality: Left;  CDE: 4.98  . CATARACT EXTRACTION W/PHACO Right 10/21/2016   Procedure: CATARACT EXTRACTION PHACO AND INTRAOCULAR LENS PLACEMENT RIGHT EYE CDE - 6.24;  Surgeon: Gemma Payor, MD;  Location: [facility];  Service: Ophthalmology;  Laterality: Right;  right  . CHOLECYSTECTOMY    .  COLONOSCOPY  11/17/08   ext hemorrhoids  . CRANIOTOMY     secondary TBI  . ESOPHAGOGASTRODUODENOSCOPY  04/05/10   [name] (60F),erosive reflux esophagitis, small hiatal hernia,  . FINGER SURGERY     removal cyst left pinky  . gastro empy study  03/03/10   normal  . HAND SURGERY    . NECK SURGERY  1998  . NEPHROLITHOTOMY  05/13/2012   Procedure: NEPHROLITHOTOMY PERCUTANEOUS;  Surgeon: Marcine Matar, MD;  Location: [facility];  Service: Urology;  Laterality: Left;      Past Medical History:  Diagnosis Date  . Arthritis   . Cervical facet joint syndrome 04/22/2012  . Cervicalgia   . Chronic headaches    Dr Hermelinda Medicus [place] [facility]  . Chronic nausea    normal GES   . Complication of anesthesia    had problem with local anesthesia-tried to assist Physician  . DM (diabetes  mellitus) (HCC)   . Elevated liver enzymes    hx of, normal 03/2010  . Glaucoma   . Headache(784.0)   . Hemorrhoids 11/17/08   Colonoscopy Dr Karilyn Cota  . Migraine without aura, with intractable migraine, so stated, without mention of status migrainosus   . Occipital neuralgia   . Posttraumatic stress disorder   . PTSD (post-traumatic stress disorder)   . Renal lithiasis   . 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 Torticollis   . Torticollis   . Unspecified musculoskeletal disorders and symptoms referable to neck    cervical/trapezius   There were no vitals taken for this visit.  Opioid Risk Score:   Fall Risk Score:  `1  Depression screen PHQ 2/9  Depression screen [facility] 2/9 04/22/2018 10/15/2016 04/10/2016 04/04/2016 11/06/2015 10/19/2015 10/18/2015  Decreased Interest 0 3 3 3  0 0 0  Down, Depressed, Hopeless 0 3 3 3  0 0 0  PHQ - 2 Score 0 6 6 6  0 0 0  Altered sleeping - - - 3 - - -  Tired, decreased energy - - - 3 - - -  Change in appetite - - - 3 - - -  Feeling bad or failure about yourself  - - - 0 - - -  Trouble concentrating - - - 3 - - -  Moving slowly or fidgety/restless - - - 3 - - -  Suicidal thoughts - - - 0 - - -  PHQ-9 Score - - - 21 - - -  Difficult doing work/chores - - - Not difficult at all - - -  Some recent data might be hidden     Review of Systems  Constitutional: Negative.   HENT: Negative.   Eyes: Negative.   Respiratory: Negative.   Cardiovascular: Negative.   Gastrointestinal: Negative.   Endocrine: Negative.   Genitourinary: Negative.   Musculoskeletal: Positive for arthralgias and myalgias.  Skin: Negative.   Allergic/Immunologic: Negative.   Neurological: Positive for dizziness, numbness and headaches.  Hematological: Negative.   Psychiatric/Behavioral: Negative.   All other systems reviewed and are negative.      Objective:   Physical Exam  General: No acute distress HEENT: EOMI, oral membranes moist Cards: reg rate  Chest: normal  effort Abdomen: Soft, NT, ND Skin: dry, intact Extremities: no edema  Neuro: Intact cranial nerve exam.  Motor and sensory exam is nonfocal. DTR are 2+. No clonus is noted.  Patient balance stable/good Musc: cervical and suboccipital tenderness continues. Psych:flat, tearful at times.    Assessment & Plan:  Assessment:  1. 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  2. Posttraumatic stress syndrome.  3. Greater occipital neuralgia (bilateral) confirmed by response to recent nerve block  4. DM 2-   PLAN:  1. Continue methadone rx.RF today. Second rx was provided#180 10mg  tablets -We will continue the controlled substance monitoring program, this consists of regular clinic visits, examinations, routine drug screening, pill counts as well as use of [place] Controlled Substance Reporting System. [place] was [place] today.  .   -We will continue methadone at the 20 mg 3 times daily schedule.  Refilled #90,  -Changed Demerol to Dilaudid 2 mg 1 every 8 hours as needed #50  -Medication was refilled and a second prescription was sent to the patient's pharmacy for next month.  Marland Kitchen   2.History of recentgreater occipital nerve RF with transient effects.  3. Continue topamax.  -sleep an issue -did not tolerate trokendi 4.Consider botox injectionsagain 5.Trial of ajovi 225 mg subcu monthly.  2 months of samples were provided.  Instructions were also reviewed.  Side effects were reviewed as well. 6.Continuezanaflex 2mg  q6 prn for spasm.using mostly in the evenings. Continue with current schedule.  7.78minutesof face to face patient care time were spent during this visit. All questions were encouraged and answered. I'll see her back in about31months.

## 2018-06-16 NOTE — Patient Instructions (Signed)
PLEASE FEEL FREE TO CALL OUR OFFICE WITH ANY PROBLEMS OR QUESTIONS ([phone])      

## 2018-06-16 NOTE — Progress Notes (Signed)
ajovy samples given to patient by provider, education handout and copay card handed to patient as well

## 2018-06-17 ENCOUNTER — Telehealth: Payer: Self-pay | Admitting: Physical Medicine & Rehabilitation

## 2018-06-22 NOTE — Telephone Encounter (Signed)
Patient called back stating she missed call from Wadie Lessen. Asking for call back

## 2018-07-06 ENCOUNTER — Telehealth: Payer: Self-pay | Admitting: Physical Medicine & Rehabilitation

## 2018-07-14 NOTE — Telephone Encounter (Signed)
Have recieved faxed requests for same medications.  Please advise.

## 2018-08-17 ENCOUNTER — Encounter: Payer: Self-pay | Admitting: Physical Medicine & Rehabilitation

## 2018-08-17 ENCOUNTER — Encounter
Payer: Worker's Compensation | Attending: Physical Medicine & Rehabilitation | Admitting: Physical Medicine & Rehabilitation

## 2018-08-17 VITALS — BP 134/83 | HR 80 | Ht 64.0 in | Wt 146.0 lb

## 2018-08-17 DIAGNOSIS — M47812 Spondylosis without myelopathy or radiculopathy, cervical region: Secondary | ICD-10-CM | POA: Diagnosis not present

## 2018-08-17 DIAGNOSIS — G8929 Other chronic pain: Secondary | ICD-10-CM | POA: Diagnosis not present

## 2018-08-17 DIAGNOSIS — E119 Type 2 diabetes mellitus without complications: Secondary | ICD-10-CM | POA: Insufficient documentation

## 2018-08-17 DIAGNOSIS — M5481 Occipital neuralgia: Secondary | ICD-10-CM | POA: Diagnosis not present

## 2018-08-17 DIAGNOSIS — G43919 Migraine, unspecified, intractable, without status migrainosus: Secondary | ICD-10-CM | POA: Insufficient documentation

## 2018-08-17 DIAGNOSIS — G43711 Chronic migraine without aura, intractable, with status migrainosus: Secondary | ICD-10-CM

## 2018-08-17 DIAGNOSIS — G4486 Cervicogenic headache: Secondary | ICD-10-CM

## 2018-08-17 DIAGNOSIS — M961 Postlaminectomy syndrome, not elsewhere classified: Secondary | ICD-10-CM | POA: Diagnosis not present

## 2018-08-17 DIAGNOSIS — F4310 Post-traumatic stress disorder, unspecified: Secondary | ICD-10-CM | POA: Diagnosis not present

## 2018-08-17 DIAGNOSIS — G43011 Migraine without aura, intractable, with status migrainosus: Secondary | ICD-10-CM

## 2018-08-17 DIAGNOSIS — Z79899 Other long term (current) drug therapy: Secondary | ICD-10-CM | POA: Insufficient documentation

## 2018-08-17 DIAGNOSIS — Z9889 Other specified postprocedural states: Secondary | ICD-10-CM | POA: Diagnosis not present

## 2018-08-17 DIAGNOSIS — R51 Headache: Secondary | ICD-10-CM

## 2018-08-17 DIAGNOSIS — Z9049 Acquired absence of other specified parts of digestive tract: Secondary | ICD-10-CM | POA: Insufficient documentation

## 2018-08-17 DIAGNOSIS — M542 Cervicalgia: Secondary | ICD-10-CM | POA: Diagnosis not present

## 2018-08-17 MED ORDER — METHADONE HCL 10 MG PO TABS: 20.0000 mg | ORAL_TABLET | Freq: Three times a day (TID) | ORAL | 0 refills | Status: DC

## 2018-08-17 MED ORDER — TAPENTADOL HCL 50 MG PO TABS: 50.0000 mg | ORAL_TABLET | Freq: Two times a day (BID) | ORAL | 0 refills | Status: DC | PRN

## 2018-08-17 NOTE — Patient Instructions (Signed)
PLEASE FEEL FREE TO CALL OUR OFFICE WITH ANY PROBLEMS OR QUESTIONS ([phone])      

## 2018-08-17 NOTE — Progress Notes (Signed)
Subjective:    Patient ID: Monica [name], female    DOB: 1951-03-20, 68 y.o.   MRN: [id number]    HPI   Monica [name] is here in follow up of her chronic pain. She did not tolerate the dilaudid or the ajovi. The ajovi caused her to have more "down days" than good days.  She did the best with Demerol for breakthrough pain, but this is no longer available.  She does use naproxen occasionally as a booster which seems to help things.  Otherwise her medical condition has not changed.  Husband has been at home more to help with her increased pain levels.  Pain Inventory Average Pain 7 Pain Right Now 9 My pain is constant, sharp, burning, stabbing and aching  In the last 24 hours, has pain interfered with the following? General activity 8 Relation with others 9 Enjoyment of life 10 What TIME of day is your pain at its worst? na Sleep (in general) Poor  Pain is worse with: walking and some activites Pain improves with: rest and heat/ice Relief from Meds: 6  Mobility walk without assistance  Function not employed: date last employed .  Neuro/Psych No problems in this area  Prior Studies Any changes since last visit?  no  Physicians involved in your care Any changes since last visit?  no   Family History  Problem Relation Age of Onset  . Colon cancer Mother 6160   Social History   Socioeconomic History  . Marital status: Married    Spouse name: Not on file  . Number of children: 3  . Years of education: Not on file  . Highest education level: Not on file  Occupational History  . Occupation: disabled    Associate Professormployer: UNEMPLOYED  Social Needs  . Financial resource strain: Not on file  . Food insecurity:    Worry: Not on file    Inability: Not on file  . Transportation needs:    Medical: Not on file    Non-medical: Not on file  Tobacco Use  . Smoking status: Never Smoker  . Smokeless tobacco: Never Used  Substance and Sexual Activity  . Alcohol use: No  . Drug use:  No  . Sexual activity: Yes    Birth control/protection: Post-menopausal  Lifestyle  . Physical activity:    Days per week: Not on file    Minutes per session: Not on file  . Stress: Not on file  Relationships  . Social connections:    Talks on phone: Not on file    Gets together: Not on file    Attends religious service: Not on file    Active member of club or organization: Not on file    Attends meetings of clubs or organizations: Not on file    Relationship status: Not on file  Other Topics Concern  . Not on file  Social History Narrative  . Not on file   Past Surgical History:  Procedure Laterality Date  . BREAST LUMPECTOMY     left  . CATARACT EXTRACTION W/PHACO Left 10/07/2016   Procedure: CATARACT EXTRACTION PHACO AND INTRAOCULAR LENS PLACEMENT (IOC);  Surgeon: Gemma PayorKerry Hunt, MD;  Location: [facility];  Service: Ophthalmology;  Laterality: Left;  CDE: 4.98  . CATARACT EXTRACTION W/PHACO Right 10/21/2016   Procedure: CATARACT EXTRACTION PHACO AND INTRAOCULAR LENS PLACEMENT RIGHT EYE CDE - 6.24;  Surgeon: Gemma PayorKerry Hunt, MD;  Location: [facility];  Service: Ophthalmology;  Laterality: Right;  right  . CHOLECYSTECTOMY    .  COLONOSCOPY  11/17/08   ext hemorrhoids  . CRANIOTOMY     secondary TBI  . ESOPHAGOGASTRODUODENOSCOPY  04/05/10   [name] (66F),erosive reflux esophagitis, small hiatal hernia,  . FINGER SURGERY     removal cyst left pinky  . gastro empy study  03/03/10   normal  . HAND SURGERY    . NECK SURGERY  1998  . NEPHROLITHOTOMY  05/13/2012   Procedure: NEPHROLITHOTOMY PERCUTANEOUS;  Surgeon: Marcine MatarStephen Dahlstedt, MD;  Location: [facility];  Service: Urology;  Laterality: Left;      Past Medical History:  Diagnosis Date  . Arthritis   . Cervical facet joint syndrome 04/22/2012  . Cervicalgia   . Chronic headaches    Dr Hermelinda [facility] [place] [facility]  . Chronic nausea    normal GES   . Complication of anesthesia    had problem with local anesthesia-tried to assist  Physician  . DM (diabetes mellitus) (HCC)   . Elevated liver enzymes    hx of, normal 03/2010  . Glaucoma   . Headache(784.0)   . Hemorrhoids 11/17/08   Colonoscopy Dr Karilyn Cotaehman  . Migraine without aura, with intractable migraine, so stated, without mention of status migrainosus   . Occipital neuralgia   . Posttraumatic stress disorder   . PTSD (post-traumatic stress disorder)   . Renal lithiasis   . 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 Torticollis   . Torticollis   . Unspecified musculoskeletal disorders and symptoms referable to neck    cervical/trapezius   BP 134/83   Pulse 80   Ht 5\' 4"  (1.626 m)   Wt 146 lb (66.2 kg)   SpO2 97%   BMI 25.06 kg/m   Opioid Risk Score:   Fall Risk Score:  `1  Depression screen PHQ 2/9  [facility] 04/22/2018 10/15/2016 04/10/2016 04/04/2016 11/06/2015 10/19/2015 10/18/2015  Decreased Interest 0 3 3 3  0 0 0  Down, Depressed, Hopeless 0 3 3 3  0 0 0  PHQ - 2 Score 0 6 6 6  0 0 0  Altered sleeping - - - 3 - - -  Tired, decreased energy - - - 3 - - -  Change in appetite - - - 3 - - -  Feeling bad or failure about yourself  - - - 0 - - -  Trouble concentrating - - - 3 - - -  Moving slowly or fidgety/restless - - - 3 - - -  Suicidal thoughts - - - 0 - - -  PHQ-9 Score - - - 21 - - -  Difficult doing work/chores - - - Not difficult at all - - -  Some recent data might be hidden     Review of Systems  Constitutional: Negative.   HENT: Negative.   Eyes: Negative.   Respiratory: Negative.   Cardiovascular: Negative.   Gastrointestinal: Negative.   Endocrine: Negative.   Genitourinary: Negative.   Musculoskeletal: Positive for arthralgias, myalgias and neck pain.  Skin: Negative.   Allergic/Immunologic: Negative.   Neurological: Positive for headaches.  Psychiatric/Behavioral: Negative.   All other systems reviewed and are negative.      Objective:   Physical Exam General: No acute distress HEENT: EOMI, oral membranes  moist Cards: reg rate  Chest: normal effort Abdomen: Soft, NT, ND Skin: dry, intact Extremities: no edema   Neuro: Intact cranial nerve exam.  Motor and sensory exam is nonfocal. DTR are 2+. No clonus is noted.  Patient balance  stable/good Musc:Continue cervical and suboccipital tenderness with range of motion and palpation Psych:Fairly upbeat today.    Assessment & Plan:   Assessment:  1. 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  2. Posttraumatic stress syndrome.  3. Greater occipital neuralgia (bilateral) confirmed by response to recent nerve block  4. DM 2-   PLAN:  1. Continue methadone rx.RF today. #180 10mg  tablets -We will continue the controlled substance monitoring program, this consists of regular clinic visits, examinations, routine drug screening, pill counts as well as use of [place] Controlled Substance Reporting System. [place].  -We will continuemethadone at the 20 mg 3 times daily schedule.     -Trial of Nucynta 50 mg every 12 hours as needed #60  -Medication was refilled and a second prescription was sent to the patient's pharmacy for next month  -naproxen for breakthrough.    2.History of recentgreater occipital nerve RF with transient effects.  3. Continue topamax.  --did not tolerate trokendi 4.Consider botox injectionsagain 5.Dc ajovi 6.Continuezanaflex 2mg  q6 prn for spasm.using mostly in the evenings. Continue with current schedule.  7.60minutesof face to face patient care time were spent during this visit. All questions were encouraged and answered. I'll see her back in about46months.

## 2018-08-24 ENCOUNTER — Telehealth: Payer: Self-pay | Admitting: *Deleted

## 2018-08-24 MED ORDER — MEPERIDINE HCL 50 MG PO TABS: 50.0000 mg | ORAL_TABLET | Freq: Two times a day (BID) | ORAL | 0 refills | Status: DC | PRN

## 2018-08-24 NOTE — Telephone Encounter (Signed)
Patient  is asking for Dr [name] to give her a temporary script for meperidine 50 mg.  She states [facility] has 19 tabs in stock.  She states that [name] is aware.

## 2018-08-24 NOTE — Telephone Encounter (Signed)
rx written

## 2018-08-25 NOTE — Telephone Encounter (Signed)
Notified. 

## 2018-09-07 ENCOUNTER — Other Ambulatory Visit: Payer: Self-pay | Admitting: Physical Medicine & Rehabilitation

## 2018-09-07 DIAGNOSIS — G894 Chronic pain syndrome: Secondary | ICD-10-CM

## 2018-09-07 DIAGNOSIS — M47812 Spondylosis without myelopathy or radiculopathy, cervical region: Secondary | ICD-10-CM

## 2018-09-07 DIAGNOSIS — M7918 Myalgia, other site: Secondary | ICD-10-CM

## 2018-09-07 DIAGNOSIS — Z79891 Long term (current) use of opiate analgesic: Secondary | ICD-10-CM

## 2018-09-07 DIAGNOSIS — G43011 Migraine without aura, intractable, with status migrainosus: Secondary | ICD-10-CM

## 2018-09-07 DIAGNOSIS — G43711 Chronic migraine without aura, intractable, with status migrainosus: Secondary | ICD-10-CM

## 2018-09-07 DIAGNOSIS — R51 Headache: Secondary | ICD-10-CM

## 2018-09-07 DIAGNOSIS — Z79899 Other long term (current) drug therapy: Secondary | ICD-10-CM

## 2018-09-07 DIAGNOSIS — Z5181 Encounter for therapeutic drug level monitoring: Secondary | ICD-10-CM

## 2018-09-07 DIAGNOSIS — G4486 Cervicogenic headache: Secondary | ICD-10-CM

## 2018-09-09 ENCOUNTER — Telehealth: Payer: Self-pay | Admitting: *Deleted

## 2018-09-09 NOTE — Telephone Encounter (Signed)
Monica [name] called and says that her workmans comp CM has not received notes from Dr [name] in over a year and they are not going to approve her nucynta without updates.  Monica [name] needs this to be addressed.

## 2018-10-19 ENCOUNTER — Encounter: Payer: Self-pay | Admitting: Physical Medicine & Rehabilitation

## 2018-10-19 ENCOUNTER — Other Ambulatory Visit: Payer: Self-pay

## 2018-10-19 ENCOUNTER — Encounter
Payer: Worker's Compensation | Attending: Physical Medicine & Rehabilitation | Admitting: Physical Medicine & Rehabilitation

## 2018-10-19 VITALS — BP 166/107 | HR 77 | Ht 64.0 in | Wt 143.2 lb

## 2018-10-19 DIAGNOSIS — Z79891 Long term (current) use of opiate analgesic: Secondary | ICD-10-CM | POA: Diagnosis not present

## 2018-10-19 DIAGNOSIS — Z5181 Encounter for therapeutic drug level monitoring: Secondary | ICD-10-CM

## 2018-10-19 DIAGNOSIS — Z9049 Acquired absence of other specified parts of digestive tract: Secondary | ICD-10-CM | POA: Insufficient documentation

## 2018-10-19 DIAGNOSIS — G8929 Other chronic pain: Secondary | ICD-10-CM | POA: Diagnosis present

## 2018-10-19 DIAGNOSIS — F4310 Post-traumatic stress disorder, unspecified: Secondary | ICD-10-CM | POA: Insufficient documentation

## 2018-10-19 DIAGNOSIS — Z79899 Other long term (current) drug therapy: Secondary | ICD-10-CM | POA: Diagnosis not present

## 2018-10-19 DIAGNOSIS — M542 Cervicalgia: Secondary | ICD-10-CM | POA: Insufficient documentation

## 2018-10-19 DIAGNOSIS — G43919 Migraine, unspecified, intractable, without status migrainosus: Secondary | ICD-10-CM | POA: Diagnosis not present

## 2018-10-19 DIAGNOSIS — M5481 Occipital neuralgia: Secondary | ICD-10-CM | POA: Diagnosis not present

## 2018-10-19 DIAGNOSIS — E119 Type 2 diabetes mellitus without complications: Secondary | ICD-10-CM | POA: Diagnosis not present

## 2018-10-19 DIAGNOSIS — G4486 Cervicogenic headache: Secondary | ICD-10-CM

## 2018-10-19 DIAGNOSIS — G894 Chronic pain syndrome: Secondary | ICD-10-CM | POA: Diagnosis not present

## 2018-10-19 DIAGNOSIS — G43711 Chronic migraine without aura, intractable, with status migrainosus: Secondary | ICD-10-CM | POA: Diagnosis not present

## 2018-10-19 DIAGNOSIS — R51 Headache: Secondary | ICD-10-CM

## 2018-10-19 DIAGNOSIS — Z9889 Other specified postprocedural states: Secondary | ICD-10-CM | POA: Insufficient documentation

## 2018-10-19 MED ORDER — METHADONE HCL 10 MG PO TABS: 20.0000 mg | ORAL_TABLET | Freq: Three times a day (TID) | ORAL | 0 refills | Status: DC

## 2018-10-19 MED ORDER — TAPENTADOL HCL 50 MG PO TABS: 50.0000 mg | ORAL_TABLET | Freq: Two times a day (BID) | ORAL | 0 refills | Status: DC | PRN
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
## 2018-10-19 NOTE — Patient Instructions (Signed)
PLEASE FEEL FREE TO CALL OUR OFFICE WITH ANY PROBLEMS OR QUESTIONS ([phone])      

## 2018-10-24 LAB — TOXASSURE SELECT,+ANTIDEPR,UR

## 2018-10-27 ENCOUNTER — Telehealth: Payer: Self-pay | Admitting: *Deleted

## 2018-10-27 NOTE — Telephone Encounter (Signed)
Urine drug screen for this encounter is consistent for prescribed medication 

## 2018-12-16 ENCOUNTER — Other Ambulatory Visit: Payer: Self-pay

## 2018-12-16 ENCOUNTER — Encounter
Payer: Worker's Compensation | Attending: Physical Medicine & Rehabilitation | Admitting: Physical Medicine & Rehabilitation

## 2018-12-16 ENCOUNTER — Encounter: Payer: Self-pay | Admitting: Physical Medicine & Rehabilitation

## 2018-12-16 VITALS — BP 100/54 | HR 72 | Temp 99.1°F | Ht 64.0 in | Wt 138.4 lb

## 2018-12-16 DIAGNOSIS — Z9049 Acquired absence of other specified parts of digestive tract: Secondary | ICD-10-CM | POA: Diagnosis not present

## 2018-12-16 DIAGNOSIS — M5481 Occipital neuralgia: Secondary | ICD-10-CM | POA: Diagnosis not present

## 2018-12-16 DIAGNOSIS — G43011 Migraine without aura, intractable, with status migrainosus: Secondary | ICD-10-CM

## 2018-12-16 DIAGNOSIS — M7918 Myalgia, other site: Secondary | ICD-10-CM

## 2018-12-16 DIAGNOSIS — Z79899 Other long term (current) drug therapy: Secondary | ICD-10-CM | POA: Insufficient documentation

## 2018-12-16 DIAGNOSIS — G43919 Migraine, unspecified, intractable, without status migrainosus: Secondary | ICD-10-CM | POA: Diagnosis not present

## 2018-12-16 DIAGNOSIS — M542 Cervicalgia: Secondary | ICD-10-CM | POA: Insufficient documentation

## 2018-12-16 DIAGNOSIS — R51 Headache: Secondary | ICD-10-CM

## 2018-12-16 DIAGNOSIS — M47812 Spondylosis without myelopathy or radiculopathy, cervical region: Secondary | ICD-10-CM

## 2018-12-16 DIAGNOSIS — F4310 Post-traumatic stress disorder, unspecified: Secondary | ICD-10-CM | POA: Insufficient documentation

## 2018-12-16 DIAGNOSIS — G43711 Chronic migraine without aura, intractable, with status migrainosus: Secondary | ICD-10-CM

## 2018-12-16 DIAGNOSIS — E119 Type 2 diabetes mellitus without complications: Secondary | ICD-10-CM | POA: Insufficient documentation

## 2018-12-16 DIAGNOSIS — G8929 Other chronic pain: Secondary | ICD-10-CM | POA: Diagnosis present

## 2018-12-16 DIAGNOSIS — G4486 Cervicogenic headache: Secondary | ICD-10-CM

## 2018-12-16 DIAGNOSIS — Z79891 Long term (current) use of opiate analgesic: Secondary | ICD-10-CM

## 2018-12-16 DIAGNOSIS — Z9889 Other specified postprocedural states: Secondary | ICD-10-CM | POA: Diagnosis not present

## 2018-12-16 DIAGNOSIS — Z5181 Encounter for therapeutic drug level monitoring: Secondary | ICD-10-CM

## 2018-12-16 DIAGNOSIS — G894 Chronic pain syndrome: Secondary | ICD-10-CM

## 2018-12-16 MED ORDER — TAPENTADOL HCL 50 MG PO TABS: 50.0000 mg | ORAL_TABLET | Freq: Two times a day (BID) | ORAL | 0 refills | Status: DC | PRN

## 2018-12-16 MED ORDER — METHADONE HCL 10 MG PO TABS: 20.0000 mg | ORAL_TABLET | Freq: Three times a day (TID) | ORAL | 0 refills | Status: DC

## 2018-12-16 MED ORDER — TOPIRAMATE 50 MG PO TABS: ORAL_TABLET | ORAL | 5 refills | Status: DC

## 2018-12-16 NOTE — Patient Instructions (Signed)
PLEASE FEEL FREE TO CALL OUR OFFICE WITH ANY PROBLEMS OR QUESTIONS ([phone])      

## 2018-12-16 NOTE — Progress Notes (Signed)
Subjective:    Patient ID: Monica [name], female    DOB: 10-31-1950, 68 y.o.   MRN: [id number]  HPI   Monica [name] is here in follow up of her chronic pain. She reports that her pain is up and down as it typically has been. However, she and her husband have been some struggles together which has led to additional stress which increases her headaches and pain at times. This is hard for Monica [name] to cope with. Her pain levels are actually a little lower at this visit though similar to prior visits. She has found that the nucynta has been effective for severe breakthrough headaches. She is trying to take it sparingly. She denies any side effects.   She uses heat and ice for her neck. She rests when she needs to.       Pain Inventory Average Pain 6 Pain Right Now 6 My pain is constant, sharp, stabbing and aching  In the last 24 hours, has pain interfered with the following? General activity 7 Relation with others 8 Enjoyment of life 7 What TIME of day is your pain at its worst? daytime Sleep (in general) Poor  Pain is worse with: bending and some activites Pain improves with: rest, heat/ice, pacing activities and medication Relief from Meds: 5  Mobility do you drive?  yes  Function retired  Neuro/Psych dizziness  Prior Studies Any changes since last visit?  no  Physicians involved in your care Any changes since last visit?  no   Family History  Problem Relation Age of Onset  . Colon cancer Mother 20   Social History   Socioeconomic History  . Marital status: Married    Spouse name: Not on file  . Number of children: 3  . Years of education: Not on file  . Highest education level: Not on file  Occupational History  . Occupation: disabled    Associate Professor: UNEMPLOYED  Social Needs  . Financial resource strain: Not on file  . Food insecurity:    Worry: Not on file    Inability: Not on file  . Transportation needs:    Medical: Not on file    Non-medical: Not on file   Tobacco Use  . Smoking status: Never Smoker  . Smokeless tobacco: Never Used  Substance and Sexual Activity  . Alcohol use: No  . Drug use: No  . Sexual activity: Yes    Birth control/protection: Post-menopausal  Lifestyle  . Physical activity:    Days per week: Not on file    Minutes per session: Not on file  . Stress: Not on file  Relationships  . Social connections:    Talks on phone: Not on file    Gets together: Not on file    Attends religious service: Not on file    Active member of club or organization: Not on file    Attends meetings of clubs or organizations: Not on file    Relationship status: Not on file  Other Topics Concern  . Not on file  Social History Narrative  . Not on file   Past Surgical History:  Procedure Laterality Date  . BREAST LUMPECTOMY     left  . CATARACT EXTRACTION W/PHACO Left 10/07/2016   Procedure: CATARACT EXTRACTION PHACO AND INTRAOCULAR LENS PLACEMENT (IOC);  Surgeon: Gemma Payor, MD;  Location: [facility];  Service: Ophthalmology;  Laterality: Left;  CDE: 4.98  . CATARACT EXTRACTION W/PHACO Right 10/21/2016   Procedure: CATARACT EXTRACTION PHACO AND INTRAOCULAR LENS  PLACEMENT RIGHT EYE CDE - 6.24;  Surgeon: Gemma PayorKerry Hunt, MD;  Location: [facility];  Service: Ophthalmology;  Laterality: Right;  right  . CHOLECYSTECTOMY    . COLONOSCOPY  11/17/08   ext hemorrhoids  . CRANIOTOMY     secondary TBI  . ESOPHAGOGASTRODUODENOSCOPY  04/05/10   [name] (18F),erosive reflux esophagitis, small hiatal hernia,  . FINGER SURGERY     removal cyst left pinky  . gastro empy study  03/03/10   normal  . HAND SURGERY    . NECK SURGERY  1998  . NEPHROLITHOTOMY  05/13/2012   Procedure: NEPHROLITHOTOMY PERCUTANEOUS;  Surgeon: Marcine MatarStephen Dahlstedt, MD;  Location: [facility];  Service: Urology;  Laterality: Left;      Past Medical History:  Diagnosis Date  . Arthritis   . Cervical facet joint syndrome 04/22/2012  . Cervicalgia   . Chronic headaches    Dr  Hermelinda MedicusSchwartz [place] [facility]ist  . Chronic nausea    normal GES   . Complication of anesthesia    had problem with local anesthesia-tried to assist Physician  . DM (diabetes mellitus) (HCC)   . Elevated liver enzymes    hx of, normal 03/2010  . Glaucoma   . Headache(784.0)   . Hemorrhoids 11/17/08   Colonoscopy Dr Karilyn Cotaehman  . Migraine without aura, with intractable migraine, so stated, without mention of status migrainosus   . Occipital neuralgia   . Posttraumatic stress disorder   . PTSD (post-traumatic stress disorder)   . Renal lithiasis   . 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 Torticollis   . Torticollis   . Unspecified musculoskeletal disorders and symptoms referable to neck    cervical/trapezius   BP (!) 100/54   Pulse 72   Temp 99.1 F (37.3 C) (Oral)   Ht 5\' 4"  (1.626 m)   Wt 138 lb 6.4 oz (62.8 kg)   SpO2 95%   BMI 23.76 kg/m   Opioid Risk Score:   Fall Risk Score:  `1  Depression screen PHQ 2/9  [facility] 2/9 10/19/2018 04/22/2018 10/15/2016 04/10/2016 04/04/2016 11/06/2015 10/19/2015  Decreased Interest 1 0 3 3 3  0 0  Down, Depressed, Hopeless 1 0 3 3 3  0 0  PHQ - 2 Score 2 0 6 6 6  0 0  Altered sleeping - - - - 3 - -  Tired, decreased energy - - - - 3 - -  Change in appetite - - - - 3 - -  Feeling bad or failure about yourself  - - - - 0 - -  Trouble concentrating - - - - 3 - -  Moving slowly or fidgety/restless - - - - 3 - -  Suicidal thoughts - - - - 0 - -  PHQ-9 Score - - - - 21 - -  Difficult doing work/chores - - - - Not difficult at all - -  Some recent data might be hidden    Review of Systems  Constitutional: Negative.   HENT: Negative.   Eyes: Negative.   Respiratory: Negative.   Cardiovascular: Negative.   Gastrointestinal: Negative.   Endocrine: Negative.   Genitourinary: Negative.   Musculoskeletal: Negative.   Skin: Negative.   Allergic/Immunologic: Negative.   Neurological: Positive for dizziness and headaches.   Hematological: Negative.   Psychiatric/Behavioral: Negative.   All other systems reviewed and are negative.      Objective:   Physical Exam General: No acute distress HEENT: EOMI, oral membranes moist  Cards: reg rate  Chest: normal effort Abdomen: Soft, NT, ND Skin: dry, intact Extremities: no edema  Neuro: Cranial nerve exam nonfocal.  Motor 5 out of 5.  Normal sensory exam DTR are 2+. no clonus Patient balance stable/good Musc: Mild cervical and shoulder girdle myofascial discomfort. Psych:generally pleasant  Assessment & Plan:   Assessment:  1. 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  2. Posttraumatic stress syndrome.  3. Greater occipital neuralgia (bilateral) confirmed by response to recent nerve block  4. DM 2- per primary   PLAN:  1. Continue methadone rx.RF today. #180 10mg  tablets -We will continue the controlled substance monitoring program, this consists of regular clinic visits, examinations, routine drug screening, pill counts as well as use of [place] Controlled Substance Reporting System. [place].               -Medication was refilled and a second prescription was sent to the patient's pharmacy for next month.   -We will continuemethadone at the 20 mg 3 times dailyschedule.    -discussed the concept of dose reduction   -conversion to Nucynta controlled release -  Nucynta 50 mg every 12 hours as needed #60has been helpful            -naproxen for breakthrough.  2.History of recentgreater occipital nerve RF with transient effects.  3. Continue topamax.  --did not tolerate trokendi 4.Needs to work through relationship issues with husband as they are weighing heavily on her. This isn't helping pain.  5.Failed ajovi trial.  not anxious to try new short acting CGRP  medication given tolerance 6.Continuezanaflex 2mg  q6 prn for spasm.generally uses in evenings. Continue with current schedule.  7.26minutesof face to face patient care time were spent during this visit. All questions were encouraged and answered. I'll see her back in 2 months.

## 2019-02-09 ENCOUNTER — Other Ambulatory Visit: Payer: Self-pay

## 2019-02-09 ENCOUNTER — Encounter: Payer: Self-pay | Admitting: Physical Medicine & Rehabilitation

## 2019-02-09 ENCOUNTER — Encounter
Payer: Worker's Compensation | Attending: Physical Medicine & Rehabilitation | Admitting: Physical Medicine & Rehabilitation

## 2019-02-09 DIAGNOSIS — M542 Cervicalgia: Secondary | ICD-10-CM | POA: Insufficient documentation

## 2019-02-09 DIAGNOSIS — Z79899 Other long term (current) drug therapy: Secondary | ICD-10-CM | POA: Insufficient documentation

## 2019-02-09 DIAGNOSIS — E119 Type 2 diabetes mellitus without complications: Secondary | ICD-10-CM | POA: Insufficient documentation

## 2019-02-09 DIAGNOSIS — M5481 Occipital neuralgia: Secondary | ICD-10-CM | POA: Insufficient documentation

## 2019-02-09 DIAGNOSIS — R51 Headache: Secondary | ICD-10-CM

## 2019-02-09 DIAGNOSIS — Z9049 Acquired absence of other specified parts of digestive tract: Secondary | ICD-10-CM | POA: Insufficient documentation

## 2019-02-09 DIAGNOSIS — F4310 Post-traumatic stress disorder, unspecified: Secondary | ICD-10-CM | POA: Diagnosis not present

## 2019-02-09 DIAGNOSIS — G43711 Chronic migraine without aura, intractable, with status migrainosus: Secondary | ICD-10-CM | POA: Diagnosis not present

## 2019-02-09 DIAGNOSIS — Z9889 Other specified postprocedural states: Secondary | ICD-10-CM | POA: Insufficient documentation

## 2019-02-09 DIAGNOSIS — G8929 Other chronic pain: Secondary | ICD-10-CM | POA: Diagnosis not present

## 2019-02-09 DIAGNOSIS — G43919 Migraine, unspecified, intractable, without status migrainosus: Secondary | ICD-10-CM | POA: Diagnosis not present

## 2019-02-09 DIAGNOSIS — S7002XS Contusion of left hip, sequela: Secondary | ICD-10-CM

## 2019-02-09 DIAGNOSIS — G4486 Cervicogenic headache: Secondary | ICD-10-CM

## 2019-02-09 MED ORDER — METHADONE HCL 10 MG PO TABS: 20.0000 mg | ORAL_TABLET | Freq: Three times a day (TID) | ORAL | 0 refills | Status: DC

## 2019-02-09 MED ORDER — TAPENTADOL HCL 50 MG PO TABS: 50.0000 mg | ORAL_TABLET | Freq: Two times a day (BID) | ORAL | 0 refills | Status: DC | PRN

## 2019-02-09 MED ORDER — NAPROXEN 500 MG PO TABS: 500.0000 mg | ORAL_TABLET | Freq: Two times a day (BID) | ORAL | 6 refills | Status: DC

## 2019-02-09 NOTE — Patient Instructions (Signed)
REALLY WORK ON YOUR NECK AND HEAD EXTENSION AS WELL AS LOOSENING UP YOUR CHEST MUSCLES SO THAT YOUR SHOULDERS WILL ROTATE BACKWARD MORE EASILY!
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
## 2019-02-12 ENCOUNTER — Other Ambulatory Visit: Payer: Self-pay | Admitting: Physical Medicine & Rehabilitation

## 2019-02-12 DIAGNOSIS — M7918 Myalgia, other site: Secondary | ICD-10-CM

## 2019-02-12 DIAGNOSIS — G4486 Cervicogenic headache: Secondary | ICD-10-CM

## 2019-04-06 LAB — HM DIABETES EYE EXAM

## 2019-04-14 ENCOUNTER — Encounter
Payer: Worker's Compensation | Attending: Physical Medicine & Rehabilitation | Admitting: Physical Medicine & Rehabilitation

## 2019-04-14 ENCOUNTER — Encounter: Payer: Self-pay | Admitting: Physical Medicine & Rehabilitation

## 2019-04-14 ENCOUNTER — Other Ambulatory Visit: Payer: Self-pay

## 2019-04-14 DIAGNOSIS — G4486 Cervicogenic headache: Secondary | ICD-10-CM

## 2019-04-14 DIAGNOSIS — G43711 Chronic migraine without aura, intractable, with status migrainosus: Secondary | ICD-10-CM | POA: Diagnosis present

## 2019-04-14 DIAGNOSIS — R51 Headache: Secondary | ICD-10-CM

## 2019-04-14 MED ORDER — METHADONE HCL 10 MG PO TABS: 20.0000 mg | ORAL_TABLET | Freq: Three times a day (TID) | ORAL | 0 refills | Status: DC

## 2019-04-14 MED ORDER — TAPENTADOL HCL 50 MG PO TABS: 50.0000 mg | ORAL_TABLET | Freq: Two times a day (BID) | ORAL | 0 refills | Status: DC | PRN

## 2019-04-14 NOTE — Patient Instructions (Signed)
PLEASE FEEL FREE TO CALL OUR OFFICE WITH ANY PROBLEMS OR QUESTIONS ([phone])      

## 2019-04-14 NOTE — Progress Notes (Signed)
Subjective:    Patient ID: Monica [name], female    DOB: [birth year], 68 y.o.   MRN: [id number]  HPI   [name] is here in follow up of her chronic pain. Pain levels are worse with the amount of weather changes we've had, especially the rain. Overall her headache patterns ar the same predominantly originating from her neck and occiput. She uses methadone for her baseline control and nucynta for severe breakthrough headaches.   She's making progress with her new home and is ready to start builiding after getting approval from the insurance company.     Pain Inventory Average Pain 7 Pain Right Now 8 My pain is .  In the last 24 hours, has pain interfered with the following? General activity 8 Relation with others 9 Enjoyment of life 8 What TIME of day is your pain at its worst? daytime Sleep (in general) Poor  Pain is worse with: walking, bending and some activites Pain improves with: rest, heat/ice, pacing activities and medication Relief from Meds: 6  Mobility Do you have any goals in this area?  no  Function retired  Neuro/Psych No problems in this area  Prior Studies Any changes since last visit?  no  Physicians involved in your care Any changes since last visit?  no   Family History  Problem Relation Age of Onset  . Colon cancer Mother 4   Social History   Socioeconomic History  . Marital status: Married    Spouse name: Not on file  . Number of children: 3  . Years of education: Not on file  . Highest education level: Not on file  Occupational History  . Occupation: disabled    Associate Professor: UNEMPLOYED  Social Needs  . Financial resource strain: Not on file  . Food insecurity    Worry: Not on file    Inability: Not on file  . Transportation needs    Medical: Not on file    Non-medical: Not on file  Tobacco Use  . Smoking status: Never Smoker  . Smokeless tobacco: Never Used  Substance and Sexual Activity  . Alcohol use: No  . Drug use: No  .  Sexual activity: Yes    Birth control/protection: Post-menopausal  Lifestyle  . Physical activity    Days per week: Not on file    Minutes per session: Not on file  . Stress: Not on file  Relationships  . Social Musician on phone: Not on file    Gets together: Not on file    Attends religious service: Not on file    Active member of club or organization: Not on file    Attends meetings of clubs or organizations: Not on file    Relationship status: Not on file  Other Topics Concern  . Not on file  Social History Narrative  . Not on file   Past Surgical History:  Procedure Laterality Date  . BREAST LUMPECTOMY     left  . CATARACT EXTRACTION W/PHACO Left 10/07/2016   Procedure: CATARACT EXTRACTION PHACO AND INTRAOCULAR LENS PLACEMENT (IOC);  Surgeon: Gemma Payor, MD;  Location: [facility];  Service: Ophthalmology;  Laterality: Left;  CDE: 4.98  . CATARACT EXTRACTION W/PHACO Right 10/21/2016   Procedure: CATARACT EXTRACTION PHACO AND INTRAOCULAR LENS PLACEMENT RIGHT EYE CDE - 6.24;  Surgeon: Gemma Payor, MD;  Location: [facility];  Service: Ophthalmology;  Laterality: Right;  right  . CHOLECYSTECTOMY    . COLONOSCOPY  11/17/08  ext hemorrhoids  . CRANIOTOMY     secondary TBI  . ESOPHAGOGASTRODUODENOSCOPY  04/05/10   [name] (43F),erosive reflux esophagitis, small hiatal hernia,  . FINGER SURGERY     removal cyst left pinky  . gastro empy study  03/03/10   normal  . HAND SURGERY    . NECK SURGERY  1998  . NEPHROLITHOTOMY  05/13/2012   Procedure: NEPHROLITHOTOMY PERCUTANEOUS;  Surgeon: Marcine MatarStephen Dahlstedt, MD;  Location: [facility];  Service: Urology;  Laterality: Left;      Past Medical History:  Diagnosis Date  . Arthritis   . Cervical facet joint syndrome 04/22/2012  . Cervicalgia   . Chronic headaches    Dr Hermelinda MedicusSchwartz [place] [facility]  . Chronic nausea    normal GES   . Complication of anesthesia    had problem with local anesthesia-tried to assist Physician   . DM (diabetes mellitus) (HCC)   . Elevated liver enzymes    hx of, normal 03/2010  . Glaucoma   . Headache(784.0)   . Hemorrhoids 11/17/08   Colonoscopy Dr Karilyn Cotaehman  . Migraine without aura, with intractable migraine, so stated, without mention of status migrainosus   . Occipital neuralgia   . Posttraumatic stress disorder   . PTSD (post-traumatic stress disorder)   . Renal lithiasis   . 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 Torticollis   . Torticollis   . Unspecified musculoskeletal disorders and symptoms referable to neck    cervical/trapezius   There were no vitals taken for this visit.  Opioid Risk Score:   Fall Risk Score:  `1  Depression screen PHQ 2/9  [facility] 12/16/2018 10/19/2018 04/22/2018 10/15/2016 04/10/2016 04/04/2016 11/06/2015  Decreased Interest 1 1 0 3 3 3  0  Down, Depressed, Hopeless 1 1 0 3 3 3  0  PHQ - 2 Score 2 2 0 6 6 6  0  Altered sleeping - - - - - 3 -  Tired, decreased energy - - - - - 3 -  Change in appetite - - - - - 3 -  Feeling bad or failure about yourself  - - - - - 0 -  Trouble concentrating - - - - - 3 -  Moving slowly or fidgety/restless - - - - - 3 -  Suicidal thoughts - - - - - 0 -  PHQ-9 Score - - - - - 21 -  Difficult doing work/chores - - - - - Not difficult at all -  Some recent data might be hidden     Review of Systems  Constitutional: Negative.   HENT: Negative.   Eyes: Negative.   Respiratory: Negative.   Cardiovascular: Negative.   Gastrointestinal: Negative.   Endocrine: Negative.   Genitourinary: Negative.   Musculoskeletal: Positive for arthralgias, back pain, myalgias and neck pain.  Skin: Negative.   Allergic/Immunologic: Negative.   Neurological: Positive for headaches.  Hematological: Negative.   Psychiatric/Behavioral: Negative.   All other systems reviewed and are negative.      Objective:   Physical Exam  General: No acute distress HEENT: EOMI, oral membranes moist Cards: reg rate   Chest: normal effort Abdomen: Soft, NT, ND Skin: dry, intact Extremities: no edema Neuro:Cranial nerve exam nonfocal. Motor 5 out of 5. Normal sensory exam DTR are 2+. no clonus Patient balance stable/good Musc:continued cervical and shoulder girdle myofascial discomfort. head forward posture. pecs very tight.  Psych: in good spirits. cooperative.  Assessment & Plan:  Assessment:  1. 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  2. Posttraumatic stress syndrome.  3. Greater occipital neuralgia (bilateral) confirmed by response to recent nerve block  4. DM 2 per primary   PLAN:  1. Continue methadone rx.RF today. #180 10mg  tablets -We will continue the controlled substance monitoring program, this consists of regular clinic visits, examinations, routine drug screening, pill counts as well as use of [place] Controlled Substance Reporting System. [place].   -Continuemethadone at the 20 mg 3 times dailyschedule.  -have reviewed the concept of dose reduction on numerous occasions -consider conversion to Nucynta controlled release - Nucynta 50 mg every 12 hours as needed #60has been helpful -naproxen for breakthrough.  2.History of recentgreater occipital nerve RF with transient effects.  3. Continue topamax.   4.  reviewed posture again today 5.Have discussed nurtec with [place] 6.Continuezanaflex 2mg  q6 prn for spasm.generally uses in evenings. Continue with current schedule.  7.15 minutesof face to face patient care time were spent during this visit. All questions were encouraged and answered.I'll see her in 2 months.

## 2019-05-24 ENCOUNTER — Other Ambulatory Visit: Payer: Self-pay | Admitting: Physical Medicine & Rehabilitation

## 2019-05-24 DIAGNOSIS — G4486 Cervicogenic headache: Secondary | ICD-10-CM

## 2019-05-24 DIAGNOSIS — M7918 Myalgia, other site: Secondary | ICD-10-CM

## 2019-06-16 ENCOUNTER — Encounter: Payer: Self-pay | Admitting: Physical Medicine & Rehabilitation

## 2019-06-16 ENCOUNTER — Other Ambulatory Visit: Payer: Self-pay

## 2019-06-16 ENCOUNTER — Encounter
Payer: Worker's Compensation | Attending: Physical Medicine & Rehabilitation | Admitting: Physical Medicine & Rehabilitation

## 2019-06-16 VITALS — BP 99/60 | HR 87 | Temp 97.5°F | Ht 64.0 in | Wt 140.0 lb

## 2019-06-16 DIAGNOSIS — R519 Headache, unspecified: Secondary | ICD-10-CM

## 2019-06-16 DIAGNOSIS — Z79899 Other long term (current) drug therapy: Secondary | ICD-10-CM | POA: Diagnosis present

## 2019-06-16 DIAGNOSIS — G43711 Chronic migraine without aura, intractable, with status migrainosus: Secondary | ICD-10-CM | POA: Insufficient documentation

## 2019-06-16 DIAGNOSIS — G4486 Cervicogenic headache: Secondary | ICD-10-CM

## 2019-06-16 DIAGNOSIS — Z5181 Encounter for therapeutic drug level monitoring: Secondary | ICD-10-CM | POA: Insufficient documentation

## 2019-06-16 MED ORDER — TAPENTADOL HCL 50 MG PO TABS: 50.0000 mg | ORAL_TABLET | Freq: Two times a day (BID) | ORAL | 0 refills | Status: DC | PRN

## 2019-06-16 MED ORDER — METHADONE HCL 10 MG PO TABS: 20.0000 mg | ORAL_TABLET | Freq: Three times a day (TID) | ORAL | 0 refills | Status: DC

## 2019-06-16 NOTE — Progress Notes (Signed)
Subjective:    Patient ID: Monica [name], female    DOB: [birth year], 68 y.o.   MRN: [id number]  HPI   Monica [name] is here in follow up of her chronic pain. She reports that 2 weeks ago her right hand went numb after sleeping hard one night. The symptoms finally dissipated after a week. She's had these symptoms before which haven't lasted as long  Her pain levels have been fairly stable and manageable at times. She remains on methadone for her baseline pain control with nucynta for breakthrough.    Pain Inventory Average Pain 8 Pain Right Now 6 My pain is constant, sharp, burning, stabbing, tingling and aching  In the last 24 hours, has pain interfered with the following? General activity 8 Relation with others 9 Enjoyment of life 10 What TIME of day is your pain at its worst? daytime Sleep (in general) Poor  Pain is worse with: walking, bending, sitting and some activites Pain improves with: medication Relief from Meds: 9  Mobility walk without assistance  Function not employed: date last employed .  Neuro/Psych No problems in this area  Prior Studies Any changes since last visit?  no  Physicians involved in your care Any changes since last visit?  no   Family History  Problem Relation Age of Onset  . Colon cancer Mother 39   Social History   Socioeconomic History  . Marital status: Married    Spouse name: Not on file  . Number of children: 3  . Years of education: Not on file  . Highest education level: Not on file  Occupational History  . Occupation: disabled    Fish farm manager: UNEMPLOYED  Social Needs  . Financial resource strain: Not on file  . Food insecurity    Worry: Not on file    Inability: Not on file  . Transportation needs    Medical: Not on file    Non-medical: Not on file  Tobacco Use  . Smoking status: Never Smoker  . Smokeless tobacco: Never Used  Substance and Sexual Activity  . Alcohol use: No  . Drug use: No  . Sexual activity: Yes     Birth control/protection: Post-menopausal  Lifestyle  . Physical activity    Days per week: Not on file    Minutes per session: Not on file  . Stress: Not on file  Relationships  . Social Herbalist on phone: Not on file    Gets together: Not on file    Attends religious service: Not on file    Active member of club or organization: Not on file    Attends meetings of clubs or organizations: Not on file    Relationship status: Not on file  Other Topics Concern  . Not on file  Social History Narrative  . Not on file   Past Surgical History:  Procedure Laterality Date  . BREAST LUMPECTOMY     left  . CATARACT EXTRACTION W/PHACO Left 10/07/2016   Procedure: CATARACT EXTRACTION PHACO AND INTRAOCULAR LENS PLACEMENT (IOC);  Surgeon: Tonny Branch, MD;  Location: [facility];  Service: Ophthalmology;  Laterality: Left;  CDE: 4.98  . CATARACT EXTRACTION W/PHACO Right 10/21/2016   Procedure: CATARACT EXTRACTION PHACO AND INTRAOCULAR LENS PLACEMENT RIGHT EYE CDE - 6.24;  Surgeon: Tonny Branch, MD;  Location: [facility];  Service: Ophthalmology;  Laterality: Right;  right  . CHOLECYSTECTOMY    . COLONOSCOPY  11/17/08   ext hemorrhoids  . CRANIOTOMY  secondary TBI  . ESOPHAGOGASTRODUODENOSCOPY  04/05/10   [name] (65F),erosive reflux esophagitis, small hiatal hernia,  . FINGER SURGERY     removal cyst left pinky  . gastro empy study  03/03/10   normal  . HAND SURGERY    . NECK SURGERY  1998  . NEPHROLITHOTOMY  05/13/2012   Procedure: NEPHROLITHOTOMY PERCUTANEOUS;  Surgeon: Marcine Matar, MD;  Location: [facility];  Service: Urology;  Laterality: Left;      Past Medical History:  Diagnosis Date  . Arthritis   . Cervical facet joint syndrome 04/22/2012  . Cervicalgia   . Chronic headaches    Dr Hermelinda Medicus [place] Pain [facility]ist  . Chronic nausea    normal GES   . Complication of anesthesia    had problem with local anesthesia-tried to assist Physician  . DM (diabetes  mellitus) (HCC)   . Elevated liver enzymes    hx of, normal 03/2010  . Glaucoma   . Headache(784.0)   . Hemorrhoids 11/17/08   Colonoscopy Dr Karilyn Cota  . Migraine without aura, with intractable migraine, so stated, without mention of status migrainosus   . Occipital neuralgia   . Posttraumatic stress disorder   . PTSD (post-traumatic stress disorder)   . Renal lithiasis   . 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 Torticollis   . Torticollis   . Unspecified musculoskeletal disorders and symptoms referable to neck    cervical/trapezius   There were no vitals taken for this visit.  Opioid Risk Score:   Fall Risk Score:  `1  Depression screen PHQ 2/9  Depression screen [facility] 2/9 12/16/2018 10/19/2018 04/22/2018 10/15/2016 04/10/2016 04/04/2016 11/06/2015  Decreased Interest 1 1 0 3 3 3  0  Down, Depressed, Hopeless 1 1 0 3 3 3  0  PHQ - 2 Score 2 2 0 6 6 6  0  Altered sleeping - - - - - 3 -  Tired, decreased energy - - - - - 3 -  Change in appetite - - - - - 3 -  Feeling bad or failure about yourself  - - - - - 0 -  Trouble concentrating - - - - - 3 -  Moving slowly or fidgety/restless - - - - - 3 -  Suicidal thoughts - - - - - 0 -  PHQ-9 Score - - - - - 21 -  Difficult doing work/chores - - - - - Not difficult at all -  Some recent data might be hidden     Review of Systems  Constitutional: Negative.   HENT: Negative.   Eyes: Negative.   Respiratory: Positive for shortness of breath.   Cardiovascular: Negative.   Gastrointestinal: Negative.   Endocrine: Negative.   Genitourinary: Negative.   Musculoskeletal: Positive for arthralgias and myalgias.  Skin: Negative.   Allergic/Immunologic: Negative.   Neurological: Negative.   Hematological: Negative.   Psychiatric/Behavioral: Negative.   All other systems reviewed and are negative.      Objective:   Physical Exam Physical Exam  Constitutional: No distress . Vital signs reviewed. HEENT: EOMI, oral membranes moist Neck:  supple Cardiovascular: RRR without murmur. No JVD    Respiratory: CTA Bilaterally without wheezes or rales. Normal effort    GI: BS +, non-tender, non-distended  Neuro:Cranial nerve exam nonfocal. Motor 5 out of 5. Normal sensory exam. Tinel's + right elbow.  DTR are 2+. no clonus Patient balance stable/good Musc:head forward posture, pecs tight.  Psych: in good spirits. cooperative.  Assessment & Plan:   Assessment:  1. 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  2. Posttraumatic stress syndrome.  3. Greater occipital neuralgia (bilateral) confirmed by response to recent nerve block  4. DM 2 per primary   PLAN:  1. Continue methadone rx.RF today. #180 10mg  tablets -We will continue the controlled substance monitoring program, this consists of regular clinic visits, examinations, routine drug screening, pill counts as well as use of [place] Cont[place]. [place].   -Continuemethadone at the 20 mg 3 times dailyschedule.  -dose reduction has been reduced -considerconversion to Nucynta controlled release - Nucynta 50 mg every 12 hours as needed #60has been helpful -naproxen for breakthrough pain        -UDS today  2.History of recentgreater occipital nerve RF with transient effects.  3. Continue topamax.   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  5.Have discussed nurtec with [place] 6.Continuezanaflex 2mg  q6 prn for spasm.generally uses in evenings. Continue with current schedule.  7.15 minutesof face to face patient care time were spent during this visit. All questions were encouraged and answered.F/u visit in about 2 months.

## 2019-06-17 DIAGNOSIS — Z1231 Encounter for screening mammogram for malignant neoplasm of breast: Secondary | ICD-10-CM | POA: Diagnosis not present

## 2019-06-17 DIAGNOSIS — R079 Chest pain, unspecified: Secondary | ICD-10-CM | POA: Diagnosis not present

## 2019-06-17 DIAGNOSIS — Z6823 Body mass index (BMI) 23.0-23.9, adult: Secondary | ICD-10-CM | POA: Diagnosis not present

## 2019-06-17 DIAGNOSIS — L292 Pruritus vulvae: Secondary | ICD-10-CM | POA: Diagnosis not present

## 2019-06-17 DIAGNOSIS — Z124 Encounter for screening for malignant neoplasm of cervix: Secondary | ICD-10-CM | POA: Diagnosis not present

## 2019-06-17 DIAGNOSIS — G40909 Epilepsy, unspecified, not intractable, without status epilepticus: Secondary | ICD-10-CM | POA: Insufficient documentation

## 2019-06-17 DIAGNOSIS — E119 Type 2 diabetes mellitus without complications: Secondary | ICD-10-CM | POA: Insufficient documentation

## 2019-06-17 DIAGNOSIS — N760 Acute vaginitis: Secondary | ICD-10-CM | POA: Diagnosis not present

## 2019-06-17 DIAGNOSIS — Z01419 Encounter for gynecological examination (general) (routine) without abnormal findings: Secondary | ICD-10-CM | POA: Diagnosis not present

## 2019-06-17 DIAGNOSIS — E1165 Type 2 diabetes mellitus with hyperglycemia: Secondary | ICD-10-CM | POA: Insufficient documentation

## 2019-06-19 LAB — TOXASSURE SELECT,+ANTIDEPR,UR

## 2019-06-21 ENCOUNTER — Telehealth: Payer: Self-pay | Admitting: *Deleted

## 2019-06-21 NOTE — Telephone Encounter (Signed)
Urine drug screen for this encounter is consistent for prescribed medication 

## 2019-06-22 ENCOUNTER — Other Ambulatory Visit: Payer: Self-pay | Admitting: Physical Medicine & Rehabilitation

## 2019-06-22 DIAGNOSIS — M7918 Myalgia, other site: Secondary | ICD-10-CM

## 2019-06-22 DIAGNOSIS — Z5181 Encounter for therapeutic drug level monitoring: Secondary | ICD-10-CM

## 2019-06-22 DIAGNOSIS — M47812 Spondylosis without myelopathy or radiculopathy, cervical region: Secondary | ICD-10-CM

## 2019-06-22 DIAGNOSIS — Z79891 Long term (current) use of opiate analgesic: Secondary | ICD-10-CM

## 2019-06-22 DIAGNOSIS — G43011 Migraine without aura, intractable, with status migrainosus: Secondary | ICD-10-CM

## 2019-06-22 DIAGNOSIS — Z79899 Other long term (current) drug therapy: Secondary | ICD-10-CM

## 2019-06-22 DIAGNOSIS — G4486 Cervicogenic headache: Secondary | ICD-10-CM

## 2019-06-22 DIAGNOSIS — G894 Chronic pain syndrome: Secondary | ICD-10-CM

## 2019-06-22 DIAGNOSIS — G43711 Chronic migraine without aura, intractable, with status migrainosus: Secondary | ICD-10-CM

## 2019-06-29 ENCOUNTER — Encounter: Payer: Self-pay | Admitting: Family Medicine

## 2019-06-29 ENCOUNTER — Other Ambulatory Visit: Payer: Self-pay

## 2019-06-29 ENCOUNTER — Ambulatory Visit (INDEPENDENT_AMBULATORY_CARE_PROVIDER_SITE_OTHER): Payer: Medicare Other | Admitting: Family Medicine

## 2019-06-29 VITALS — BP 125/70 | HR 84 | Temp 97.0°F | Ht 64.0 in | Wt 138.2 lb

## 2019-06-29 DIAGNOSIS — R1111 Vomiting without nausea: Secondary | ICD-10-CM

## 2019-06-29 DIAGNOSIS — Z23 Encounter for immunization: Secondary | ICD-10-CM

## 2019-06-29 DIAGNOSIS — E1142 Type 2 diabetes mellitus with diabetic polyneuropathy: Secondary | ICD-10-CM

## 2019-06-29 DIAGNOSIS — Z79899 Other long term (current) drug therapy: Secondary | ICD-10-CM

## 2019-06-29 DIAGNOSIS — Z1159 Encounter for screening for other viral diseases: Secondary | ICD-10-CM | POA: Diagnosis not present

## 2019-06-29 DIAGNOSIS — F1120 Opioid dependence, uncomplicated: Secondary | ICD-10-CM

## 2019-06-29 DIAGNOSIS — K219 Gastro-esophageal reflux disease without esophagitis: Secondary | ICD-10-CM

## 2019-06-29 DIAGNOSIS — I499 Cardiac arrhythmia, unspecified: Secondary | ICD-10-CM

## 2019-06-29 DIAGNOSIS — F331 Major depressive disorder, recurrent, moderate: Secondary | ICD-10-CM

## 2019-06-29 DIAGNOSIS — E119 Type 2 diabetes mellitus without complications: Secondary | ICD-10-CM | POA: Diagnosis not present

## 2019-06-29 DIAGNOSIS — Z9889 Other specified postprocedural states: Secondary | ICD-10-CM

## 2019-06-29 DIAGNOSIS — F4310 Post-traumatic stress disorder, unspecified: Secondary | ICD-10-CM

## 2019-06-29 DIAGNOSIS — G894 Chronic pain syndrome: Secondary | ICD-10-CM

## 2019-06-29 DIAGNOSIS — G43711 Chronic migraine without aura, intractable, with status migrainosus: Secondary | ICD-10-CM

## 2019-06-29 LAB — BAYER DCA HB A1C WAIVED: HB A1C (BAYER DCA - WAIVED): >14 % — ABNORMAL HIGH (ref <7.0)

## 2019-06-29 MED ORDER — METFORMIN HCL 500 MG PO TABS: ORAL_TABLET | ORAL | 0 refills | Status: DC
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
## 2019-06-29 NOTE — Patient Instructions (Signed)

## 2019-06-30 LAB — CMP14+EGFR
ALT: 26 IU/L (ref 0–32)
AST: 24 IU/L (ref 0–40)
Albumin/Globulin Ratio: 1.5 (ref 1.2–2.2)
Albumin: 4.1 g/dL (ref 3.8–4.8)
Alkaline Phosphatase: 204 IU/L — ABNORMAL HIGH (ref 39–117)
BUN/Creatinine Ratio: 19 (ref 12–28)
BUN: 18 mg/dL (ref 8–27)
Bilirubin Total: 0.3 mg/dL (ref 0.0–1.2)
CO2: 24 mmol/L (ref 20–29)
Calcium: 9 mg/dL (ref 8.7–10.3)
Chloride: 95 mmol/L — ABNORMAL LOW (ref 96–106)
Creatinine, Ser: 0.97 mg/dL (ref 0.57–1.00)
GFR calc Af Amer: 70 mL/min/{1.73_m2} (ref >59)
GFR calc non Af Amer: 61 mL/min/{1.73_m2} (ref >59)
Globulin, Total: 2.7 g/dL (ref 1.5–4.5)
Glucose: 527 mg/dL (ref 65–99)
Potassium: 4.5 mmol/L (ref 3.5–5.2)
Sodium: 133 mmol/L — ABNORMAL LOW (ref 134–144)
Total Protein: 6.8 g/dL (ref 6.0–8.5)

## 2019-06-30 LAB — CBC WITH DIFFERENTIAL/PLATELET
Basophils Absolute: 0.1 10*3/uL (ref 0.0–0.2)
Basos: 1 %
EOS (ABSOLUTE): 0.2 10*3/uL (ref 0.0–0.4)
Eos: 2 %
Hematocrit: 39.7 % (ref 34.0–46.6)
Hemoglobin: 12.7 g/dL (ref 11.1–15.9)
Immature Grans (Abs): 0 10*3/uL (ref 0.0–0.1)
Immature Granulocytes: 0 %
Lymphocytes Absolute: 1.2 10*3/uL (ref 0.7–3.1)
Lymphs: 14 %
MCH: 28.9 pg (ref 26.6–33.0)
MCHC: 32 g/dL (ref 31.5–35.7)
MCV: 90 fL (ref 79–97)
Monocytes Absolute: 0.4 10*3/uL (ref 0.1–0.9)
Monocytes: 5 %
Neutrophils Absolute: 6.5 10*3/uL (ref 1.4–7.0)
Neutrophils: 78 %
Platelets: 160 10*3/uL (ref 150–450)
RBC: 4.39 x10E6/uL (ref 3.77–5.28)
RDW: 13.4 % (ref 11.7–15.4)
WBC: 8.4 10*3/uL (ref 3.4–10.8)

## 2019-06-30 LAB — BAYER DCA HB A1C WAIVED: HB A1C (BAYER DCA - WAIVED): >14 % — ABNORMAL HIGH (ref <7.0)

## 2019-06-30 LAB — LIPID PANEL
Chol/HDL Ratio: 4.1 ratio (ref 0.0–4.4)
Cholesterol, Total: 147 mg/dL (ref 100–199)
HDL: 36 mg/dL — ABNORMAL LOW (ref >39)
LDL Chol Calc (NIH): 81 mg/dL (ref 0–99)
Triglycerides: 174 mg/dL — ABNORMAL HIGH (ref 0–149)
VLDL Cholesterol Cal: 30 mg/dL (ref 5–40)

## 2019-06-30 LAB — MICROALBUMIN / CREATININE URINE RATIO
Creatinine, Urine: 49.8 mg/dL
Microalb/Creat Ratio: 10 mg/g creat (ref 0–29)
Microalbumin, Urine: 5 ug/mL

## 2019-06-30 LAB — HEPATITIS C ANTIBODY: Hep C Virus Ab: <0.1 s/co ratio (ref 0.0–0.9)

## 2019-06-30 MED ORDER — SHINGRIX 50 MCG/0.5ML IM SUSR: 0.5000 mL | Freq: Once | INTRAMUSCULAR | 0 refills | Status: AC

## 2019-06-30 NOTE — Progress Notes (Signed)
Left message,  shingrix immunization was ordered at [facility].   Call them to check price.  Please call our office for any questions.Marland Kitchen

## 2019-06-30 NOTE — Progress Notes (Signed)
Left message to please call our office. Shingrix sent to pharmacy.

## 2019-07-05 ENCOUNTER — Encounter: Payer: Self-pay | Admitting: Internal Medicine

## 2019-07-16 ENCOUNTER — Ambulatory Visit (INDEPENDENT_AMBULATORY_CARE_PROVIDER_SITE_OTHER): Payer: Medicare Other | Admitting: Family

## 2019-07-16 ENCOUNTER — Encounter: Payer: Self-pay | Admitting: Family

## 2019-07-16 ENCOUNTER — Ambulatory Visit (INDEPENDENT_AMBULATORY_CARE_PROVIDER_SITE_OTHER): Payer: Medicare Other

## 2019-07-16 ENCOUNTER — Other Ambulatory Visit: Payer: Self-pay

## 2019-07-16 VITALS — BP 135/67 | HR 61 | Temp 97.8°F | Ht 64.0 in | Wt 142.0 lb

## 2019-07-16 DIAGNOSIS — M25551 Pain in right hip: Secondary | ICD-10-CM

## 2019-07-16 DIAGNOSIS — M169 Osteoarthritis of hip, unspecified: Secondary | ICD-10-CM | POA: Diagnosis not present

## 2019-07-16 DIAGNOSIS — M1611 Unilateral primary osteoarthritis, right hip: Secondary | ICD-10-CM | POA: Diagnosis not present

## 2019-07-16 MED ORDER — PREDNISONE 10 MG (21) PO TBPK: ORAL_TABLET | ORAL | 0 refills | Status: DC

## 2019-07-16 NOTE — Progress Notes (Signed)
   Subjective:    Patient ID: [name], female    DOB: 05-09-1951, 68 y.o.   MRN: [id number]  Hip Pain  The incident occurred more than 1 week ago. There was no injury mechanism. The pain is present in the right hip. The pain is at a severity of 10/10. The pain is moderate. The pain has been fluctuating since onset. Associated symptoms include a loss of sensation. Pertinent negatives include no numbness. She reports no foreign bodies present. The symptoms are aggravated by movement and weight bearing. She has tried non-weight bearing and NSAIDs for the symptoms. The treatment provided mild relief.      Review of Systems  Neurological: Negative for numbness.  All other systems reviewed and are negative.      Objective:   Physical Exam Vitals signs reviewed.  Constitutional:      General: She is not in acute distress.    Appearance: She is well-developed.  HENT:     Head: Normocephalic and atraumatic.  Eyes:     Pupils: Pupils are equal, round, and reactive to light.  Neck:     Musculoskeletal: Normal range of motion and neck supple.     Thyroid: No thyromegaly.  Cardiovascular:     Rate and Rhythm: Normal rate and regular rhythm.     Heart sounds: Normal heart sounds. No murmur.  Pulmonary:     Effort: Pulmonary effort is normal. No respiratory distress.     Breath sounds: Normal breath sounds. No wheezing.  Abdominal:     General: Bowel sounds are normal. There is no distension.     Palpations: Abdomen is soft.     Tenderness: There is no abdominal tenderness.  Musculoskeletal: Normal range of motion.        General: No tenderness.  Skin:    General: Skin is warm and dry.     Coloration: Skin is pale.  Neurological:     Mental Status: She is alert and oriented to person, place, and time.     Cranial Nerves: No cranial nerve deficit.     Deep Tendon Reflexes: Reflexes are normal and symmetric.  Psychiatric:        Behavior: Behavior normal.        Thought  Content: Thought content normal.        Judgment: Judgment normal.    BP 135/67   Pulse 61   Temp 97.8 F (36.6 C) (Temporal)   Ht 5\' 4"  (1.626 m)   Wt 142 lb (64.4 kg)   SpO2 99%   BMI 24.37 kg/m       Assessment & Plan:  Jorene Kaylor [place] comes in today with chief complaint of pain in right hip   Diagnosis and orders addressed:  1. Pain in right hip - DG HIP UNILAT WITH PELVIS 2-3 VIEWS RIGHT; Future - Ambulatory referral to Orthopedic Surgery - predniSONE (STERAPRED UNI-PAK 21 TAB) 10 MG (21) TBPK tablet; Use as directed  Dispense: 21 tablet; Refill: 0  2. Osteoarthrosis, hip - Ambulatory referral to Orthopedic Surgery - predniSONE (STERAPRED UNI-PAK 21 TAB) 10 MG (21) TBPK tablet; Use as directed  Dispense: 21 tablet; Refill: 0   Rest Continue Naprosyn and start prednisone  Ice ROM exercises discussed Referral to Ortho pending  Evelina Dun, FNP

## 2019-07-16 NOTE — Patient Instructions (Signed)

## 2019-07-19 ENCOUNTER — Telehealth: Payer: Self-pay | Admitting: Family

## 2019-07-19 ENCOUNTER — Encounter: Payer: Self-pay | Admitting: Family Medicine

## 2019-07-19 NOTE — Telephone Encounter (Signed)
Can we check on the Ortho referral for pain?

## 2019-07-19 NOTE — Telephone Encounter (Signed)
Referral was sent to [facility] today and they should contact patient

## 2019-07-20 ENCOUNTER — Ambulatory Visit: Payer: Medicare Other | Admitting: Orthopaedic Surgery

## 2019-07-20 ENCOUNTER — Other Ambulatory Visit: Payer: Self-pay | Admitting: Physical Medicine & Rehabilitation

## 2019-07-20 ENCOUNTER — Ambulatory Visit (INDEPENDENT_AMBULATORY_CARE_PROVIDER_SITE_OTHER): Payer: Medicare Other | Admitting: Orthopaedic Surgery

## 2019-07-20 ENCOUNTER — Telehealth: Payer: Self-pay | Admitting: Orthopaedic Surgery

## 2019-07-20 ENCOUNTER — Other Ambulatory Visit: Payer: Self-pay

## 2019-07-20 ENCOUNTER — Encounter: Payer: Self-pay | Admitting: Orthopaedic Surgery

## 2019-07-20 DIAGNOSIS — M7062 Trochanteric bursitis, left hip: Secondary | ICD-10-CM | POA: Diagnosis not present

## 2019-07-20 DIAGNOSIS — Z79891 Long term (current) use of opiate analgesic: Secondary | ICD-10-CM

## 2019-07-20 DIAGNOSIS — Z5181 Encounter for therapeutic drug level monitoring: Secondary | ICD-10-CM

## 2019-07-20 DIAGNOSIS — Z79899 Other long term (current) drug therapy: Secondary | ICD-10-CM

## 2019-07-20 DIAGNOSIS — M47812 Spondylosis without myelopathy or radiculopathy, cervical region: Secondary | ICD-10-CM

## 2019-07-20 DIAGNOSIS — G4486 Cervicogenic headache: Secondary | ICD-10-CM

## 2019-07-20 DIAGNOSIS — M7918 Myalgia, other site: Secondary | ICD-10-CM

## 2019-07-20 DIAGNOSIS — G894 Chronic pain syndrome: Secondary | ICD-10-CM

## 2019-07-20 DIAGNOSIS — G43711 Chronic migraine without aura, intractable, with status migrainosus: Secondary | ICD-10-CM

## 2019-07-20 DIAGNOSIS — G43011 Migraine without aura, intractable, with status migrainosus: Secondary | ICD-10-CM

## 2019-07-20 MED ORDER — METHYLPREDNISOLONE ACETATE 40 MG/ML IJ SUSP
40.0000 mg | INTRAMUSCULAR | Status: AC | PRN
  Administered 2019-07-20: 40 mg via INTRA_ARTICULAR

## 2019-07-20 MED ORDER — LIDOCAINE HCL 1 % IJ SOLN
0.5000 mL | INTRAMUSCULAR | Status: AC | PRN
  Administered 2019-07-20: .5 mL

## 2019-07-20 MED ORDER — BUPIVACAINE HCL 0.25 % IJ SOLN
2.0000 mL | INTRAMUSCULAR | Status: AC | PRN
  Administered 2019-07-20: 2 mL via INTRA_ARTICULAR

## 2019-07-20 NOTE — Telephone Encounter (Signed)
Work in appt made for this afternoon. Appt on 07/22/2019 cancelled.

## 2019-07-20 NOTE — Telephone Encounter (Signed)
Pt called in crying stating she is in a lot of pain and cant wait till her appt on 12/10 she's wondering if she can get worked in sooner to see Dr.Yates she'll even come to the [facility] location instead.  Pt is a referral, please give pt a call    [phone]

## 2019-07-20 NOTE — Progress Notes (Signed)
Office Visit Note   Patient: Monica [name]           Date of Birth: May 26, 1951           MRN: [id number] Visit Date: 07/20/2019              Requested by: Gwenlyn FudgeJoyce, Britney F, FNP [id number] [place],  [place] [id number] PCP: Gwenlyn FudgeJoyce, Britney F, FNP   Assessment & Plan: Visit Diagnoses:  1. Trochanteric bursitis, left hip     Plan: I reviewed with the patient her previous cervical myelogram CT scan 2003.  She has a solid fusion.  X-rays of pelvis and hips were reviewed which shows no significant right hip osteoarthritis.  She does have trochanteric bursitis and we will proceed with a trochanteric injection.  She can follow-up if she is having persistent symptoms.  Follow-Up Instructions: No follow-ups on file.   Orders:  No orders of the defined types were placed in this encounter.  No orders of the defined types were placed in this encounter.     Procedures: Large Joint Inj: R greater trochanter on 07/20/2019 1:28 PM Details: 22 G needle, lateral approach Medications: 0.5 mL lidocaine 1 %; 2 mL bupivacaine 0.25 %; 40 mg methylPREDNISolone acetate 40 MG/ML      Clinical Data: No additional findings.   Subjective: Chief Complaint  Patient presents with   Right Hip - Pain    HPI 68 year old female seen with right groin pain.  She has pain with ambulation and she states her pain is constant.  She has had previous two-level cervical fusion has pain after that associated with constant migraines and has been on methadone for more than 10 years taking 3 a day.  She has been unsuccessful attempts to wean due to increased pain.  Cervical myelogram CT scan 2003 showed no evidence of compression she did have some mild facet changes at C3-4 and C4-5 above her solid fusion.  He has had prednisone pack in the past also taken Naprosyn with slight improvement.  She is not used pain.  She states the groin pain has tended to come and go sometimes be gone for a few months but now states is  gotten severe and she rates her pain 10 out of 10.  Review of Systems 14 point review of systems positive for cervical facet arthropathy at C3-4.  Borderline personality disorder, chronic neck pain, depression, diabetes, history of dyspnea.   Objective: Vital Signs: Ht 5\' 4"  (1.626 m)    Wt 142 lb (64.4 kg)    BMI 24.37 kg/m   Physical Exam Constitutional:      Appearance: She is well-developed.  HENT:     Head: Normocephalic.     Comments: Pupils react to light.    Right Ear: External ear normal.     Left Ear: External ear normal.  Eyes:     Pupils: Pupils are equal, round, and reactive to light.  Neck:     Thyroid: No thyromegaly.     Trachea: No tracheal deviation.  Cardiovascular:     Rate and Rhythm: Normal rate.  Pulmonary:     Effort: Pulmonary effort is normal.  Abdominal:     Palpations: Abdomen is soft.  Skin:    General: Skin is warm and dry.  Neurological:     Mental Status: She is alert and oriented to person, place, and time.  Psychiatric:        Behavior: Behavior normal.  Ortho Exam negative Trendelenburg gait she tends to walk with her knee relatively straight short stride shuffled gait.  Negative logroll of the hips in sitting position.  She complains of groin pain and tenderness with palpation.  Femoral pulses normal no lymphadenopathy in the groin noted severe  trochanteric bursal tenderness left side only.  No pitting edema distal pulses intact knee range of motion is good.  Specialty Comments:  No specialty comments available.  Imaging: No results found.   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   Cervical facet arthropathy (severe at C3-4) (Bilateral) (L>R) 07/26/2015   Cervical foraminal stenosis (C3-4) (Left) 07/26/2015   Cervical spondylosis 06/13/2015   Cervical facet syndrome (Location of Secondary source of pain) (Bilateral) (L>R) 06/13/2015   Chronic pain 06/13/2015   Cervicogenic headache (Location of Primary Source of Pain) (Bilateral) (L>R) 06/13/2015   Chronic neck pain (Location of Secondary source of pain) (Bilateral) (L>R) 06/13/2015   Long term current use of opiate analgesic 06/13/2015   Opiate use (600 MME/Day) 06/13/2015   Opiate dependence ([place]) 06/13/2015   Myofascial muscle pain 04/19/2013   Dyspnea 02/10/2013   Intractable chronic migraine without aura and with status migrainosus 04/22/2012   Depression 04/22/2012   Borderline personality disorder ([place]) 04/22/2012   GERD (gastroesophageal reflux disease) 05/31/2011   Past Medical History:  Diagnosis Date   Arthritis    Cervical facet joint syndrome 04/22/2012   Cervicalgia    Chronic headaches    Dr Tessa Lerner [facility]   Chronic nausea    normal GES    Complication of anesthesia    had problem with local anesthesia-tried to assist Physician   DM (diabetes mellitus) ([place])    Elevated liver enzymes    hx of, normal 03/2010   GERD (gastroesophageal reflux disease) 05/31/2011   History Schatzki's ring, erosive reflux esophagitis, status post last EGD and dilation 04/05/10    Glaucoma    lens in both eyes   Hemorrhoids 11/17/08   Colonoscopy Dr Laural Golden   Migraine without aura, with intractable migraine, so stated, without mention of status migrainosus    Occipital neuralgia    PTSD (post-traumatic stress disorder)    Renal lithiasis    Seizure ([place])    12 yrs ago-med related   Torticollis    Unspecified musculoskeletal disorders and symptoms  referable to neck    cervical/trapezius    Family History  Problem Relation Age of Onset   Colon cancer Mother 25   Heart disease Father    Diabetes Father    Diabetes Paternal Grandmother    Heart disease Paternal Grandfather    Diverticulitis Daughter     Past Surgical History:  Procedure Laterality Date   BREAST LUMPECTOMY     left   CATARACT EXTRACTION W/PHACO Left 10/07/2016   Procedure: CATARACT EXTRACTION PHACO AND INTRAOCULAR LENS PLACEMENT ([place]);  Surgeon: Tonny Branch, MD;  Location: [facility];  Service: Ophthalmology;  Laterality: Left;  CDE: 4.98   CATARACT EXTRACTION W/PHACO Right 10/21/2016   Procedure: CATARACT EXTRACTION PHACO AND INTRAOCULAR LENS PLACEMENT RIGHT EYE CDE -  6.24;  Surgeon: Gemma Payor, MD;  Location: [facility];  Service: Ophthalmology;  Laterality: Right;  right   CHOLECYSTECTOMY     COLONOSCOPY  11/17/08   ext hemorrhoids   CRANIOTOMY     secondary TBI   CYST EXCISION     left wrist   ESOPHAGOGASTRODUODENOSCOPY  04/05/10   [name] (67F),erosive reflux esophagitis, small hiatal hernia,   FINGER SURGERY     removal cyst left pinky   gastro empy study  03/03/10   normal   HAND SURGERY     NECK SURGERY  1998   NEPHROLITHOTOMY  05/13/2012   Procedure: NEPHROLITHOTOMY PERCUTANEOUS;  Surgeon: Marcine Matar, MD;  Location: [facility];  Service: Urology;  Laterality: Left;      Social History   Occupational History   Occupation: disabled    Associate Professor: UNEMPLOYED  Tobacco Use   Smoking status: Never Smoker   Smokeless tobacco: Never Used  Substance and Sexual Activity   Alcohol use: No   Drug use: No   Sexual activity: Yes    Birth control/protection: Post-menopausal

## 2019-07-22 ENCOUNTER — Ambulatory Visit: Payer: Medicare Other | Admitting: Orthopaedic Surgery

## 2019-07-22 ENCOUNTER — Ambulatory Visit: Payer: Medicare Other | Admitting: Gastroenterology

## 2019-08-03 ENCOUNTER — Ambulatory Visit (INDEPENDENT_AMBULATORY_CARE_PROVIDER_SITE_OTHER): Payer: Medicare Other | Admitting: Gastroenterology

## 2019-08-03 ENCOUNTER — Other Ambulatory Visit: Payer: Self-pay

## 2019-08-03 ENCOUNTER — Encounter: Payer: Self-pay | Admitting: Gastroenterology

## 2019-08-03 DIAGNOSIS — R194 Change in bowel habit: Secondary | ICD-10-CM | POA: Insufficient documentation

## 2019-08-03 DIAGNOSIS — R1310 Dysphagia, unspecified: Secondary | ICD-10-CM | POA: Insufficient documentation

## 2019-08-03 MED ORDER — PANTOPRAZOLE SODIUM 40 MG PO TBEC: 40.0000 mg | DELAYED_RELEASE_TABLET | Freq: Every day | ORAL | 3 refills | Status: DC
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
## 2019-08-03 NOTE — Patient Instructions (Signed)
We have arranged an upper endoscopy with dilation by Dr. Gala Romney in the near future.  We will circle back around to a colonoscopy at your next visit.   Start taking Protonix once daily, 30 minutes before breakfast daily. I sent this to your pharmacy.  We will see you back in about 6 weeks!  It was a pleasure to see you today. I want to create trusting relationships with patients to provide genuine, compassionate, and quality care. I value your feedback. If you receive a survey regarding your visit,  I greatly appreciate you taking time to fill this out.   Annitta Needs, PhD, ANP-BC [facility]

## 2019-08-11 ENCOUNTER — Ambulatory Visit: Payer: Self-pay

## 2019-08-11 ENCOUNTER — Other Ambulatory Visit: Payer: Self-pay

## 2019-08-11 ENCOUNTER — Ambulatory Visit (INDEPENDENT_AMBULATORY_CARE_PROVIDER_SITE_OTHER): Payer: Medicare Other | Admitting: Surgery

## 2019-08-11 ENCOUNTER — Ambulatory Visit (INDEPENDENT_AMBULATORY_CARE_PROVIDER_SITE_OTHER): Payer: Medicare Other

## 2019-08-11 ENCOUNTER — Encounter: Payer: Self-pay | Admitting: Surgery

## 2019-08-11 VITALS — BP 102/62

## 2019-08-11 DIAGNOSIS — M533 Sacrococcygeal disorders, not elsewhere classified: Secondary | ICD-10-CM

## 2019-08-11 DIAGNOSIS — M5441 Lumbago with sciatica, right side: Secondary | ICD-10-CM

## 2019-08-11 DIAGNOSIS — M4726 Other spondylosis with radiculopathy, lumbar region: Secondary | ICD-10-CM | POA: Diagnosis not present

## 2019-08-11 DIAGNOSIS — G8929 Other chronic pain: Secondary | ICD-10-CM

## 2019-08-11 NOTE — Progress Notes (Signed)
Subjective: Patient is here for ultrasound-guided intra-articular right SI joint injection.   Subacute pain in this area without radicular symptoms.  She is also struggling with a migraine headache today.  Objective: She is point tender over the midportion of the right SI joint.  Well-healed midline surgical scar.  Procedure: Ultrasound-guided right SI injection: After sterile prep with Betadine, injected 5 cc 1% lidocaine without epinephrine and 40 mg methylprednisolone using a 22-gauge spinal needle, passing the needle through the sacroiliac ligament into the region of the SI joint.  Excellent immediate relief.  Follow-up as directed.

## 2019-08-11 NOTE — Progress Notes (Signed)
Office Visit Note   Patient: Monica [name]           Date of Birth: 05/01/1951           MRN: [id number] Visit Date: 08/11/2019              Requested by: Loman Brooklyn, [place],  [place] [id number] PCP: Loman Brooklyn, FNP   Assessment & Plan: Visit Diagnoses:  1. Acute right-sided low back pain with right-sided sciatica   2. Chronic right SI joint pain   3. Other spondylosis with radiculopathy, lumbar region     Plan: I think patient is describing problems in a couple of areas.  I asked Dr. Junius Roads to perform a diagnostic/therapeutic ultrasound-guided right SI joint Marcaine/Depo-Medrol injection today.  Patient will pay close attention to how she feels after this is done.  She will call me in 2 weeks to let me know how she is feeling.  If she still continues to describe having the right leg radicular pain I will plan to get a lumbar MRI to rule out HNP/stenosis.  Patient has already failed conservative treatment with activity modification and prednisone taper that was given a few weeks ago.    Follow-Up Instructions: Return in about 3 weeks (around 09/01/2019) for With [facility] recheck after SI joint injection and follow-up lumbar.   Orders:  Orders Placed This Encounter  Procedures  . XR Lumbar Spine Complete  . MS[facility] [place] - NO CHARGES   No orders of the defined types were placed in this encounter.     Procedures: No procedures performed   Clinical Data: No additional findings.   Subjective: Chief Complaint  Patient presents with  . Right Hip - Pain    HPI 68 year old white female comes in with complaints of right lower back pain and right lower extremity radiculopathy.  States that this problem has been ongoing for a few weeks.  No injury.  Patient is status post lumbar discectomy several years ago when living in [place].  Patient was seen by her PCP a few weeks ago and prednisone taper was prescribed.  States that this did not really give any  relief.  She localizes most of her right low back pain to the SI joint.  This aggravated when she is ambulating and sitting.  Patient also has some pain is radiating down to the right knee.  States that this has been intermittent but can be constant at times.  No left leg symptoms.  States that she has some pain into the right groin. Review of Systems No current cardiac pulmonary GI GU issues  Objective: Vital Signs: BP 102/62   Physical Exam HENT:     Head: Normocephalic and atraumatic.  Eyes:     Extraocular Movements: Extraocular movements intact.     Pupils: Pupils are equal, round, and reactive to light.  Musculoskeletal:     Comments: Gait is antalgic with a single prong cane.  Patient has marked tenderness over the right SI joint.  Negative logroll bilateral hips.  Negative straight leg raise bilaterally.  Neurovascular intact.  No focal motor deficits.  Neurological:     General: No focal deficit present.     Mental Status: She is alert and oriented to person, place, and time.  Psychiatric:        Mood and Affect: Mood normal.     Ortho Exam  Specialty Comments:  No specialty comments available.  Imaging: MS[facility] [place] -  NO CHARGES  Result Date: 08/11/2019 Please see Notes tab for imaging impression.  XR Lumbar Spine Complete  Result Date: 08/11/2019 X-rays lumbar spine show multilevel lumbar spondylosis but most significant finding at L4-5 with complete disc space collapse and endplate sclerotic changes.  L4-S1 facet arthropathy.  Degenerative scoliosis.  Patient also has degenerative changes of the right SI joint.  No acute finding.    PMFS History: Patient Active Problem List   Diagnosis Date Noted  . Dysphagia 08/03/2019  . Altered bowel habits 08/03/2019  . Trochanteric bursitis, left hip 07/20/2019  . Diabetes mellitus (HCC) 06/17/2019  . PTSD (post-traumatic stress disorder) 10/15/2016  . Pain management 10/06/2015  . Encounter for therapeutic drug level  monitoring 10/02/2015  . Occipital neuralgia (Location of Primary Source of Pain) (Bilateral) (L>R) 10/02/2015  . Chronic cervical radicular pain (Left) 08/31/2015  . 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 Cervical facet arthropathy (severe at C3-4) (Bilateral) (L>R) 07/26/2015  . Cervical foraminal stenosis (C3-4) (Left) 07/26/2015  . Cervical spondylosis 06/13/2015  . Cervical facet syndrome (Location of Secondary source of pain) (Bilateral) (L>R) 06/13/2015  . Chronic pain 06/13/2015  . Cervicogenic headache (Location of Primary Source of Pain) (Bilateral) (L>R) 06/13/2015  . Chronic neck pain (Location of Secondary source of pain) (Bilateral) (L>R) 06/13/2015  . Long term current use of opiate analgesic 06/13/2015  . Opiate use (600 MME/Day) 06/13/2015  . Opiate dependence (HCC) 06/13/2015  . Myofascial muscle pain 04/19/2013  . Dyspnea 02/10/2013  . Intractable chronic migraine without aura and with status migrainosus 04/22/2012  . Depression 04/22/2012  . Borderline personality disorder (HCC) 04/22/2012  . GERD (gastroesophageal reflux disease) 05/31/2011   Past Medical History:  Diagnosis Date  . Arthritis   . Cervical facet joint syndrome 04/22/2012  . Cervicalgia   . Chronic headaches    Dr Hermelinda Medicus [place] Pain Specialist  . Chronic nausea    normal GES   . Complication of anesthesia    had problem with local anesthesia-tried to assist Physician  . DM (diabetes mellitus) (HCC)   . Elevated liver enzymes    hx of, normal 03/2010  . GERD (gastroesophageal reflux disease) 05/31/2011   History Schatzki's ring, erosive reflux esophagitis, status post last EGD and dilation 04/05/10   . Glaucoma    lens in both eyes  . Hemorrhoids 11/17/08   Colonoscopy Dr Karilyn Cota  . Migraine without aura, with intractable migraine, so stated, without mention of status migrainosus   . Occipital neuralgia   . PTSD (post-traumatic stress disorder)   . Renal lithiasis   .  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 Torticollis   . Unspecified musculoskeletal disorders and symptoms referable to neck    cervical/trapezius    Family History  Problem Relation Age of Onset  . Colon cancer Mother 45  . Heart disease Father   . Diabetes Father   . Diabetes Paternal Grandmother   . Heart disease Paternal Grandfather   . Diverticulitis Daughter     Past Surgical History:  Procedure Laterality Date  . BREAST LUMPECTOMY     left  . CATARACT EXTRACTION W/PHACO Left 10/07/2016   Procedure: CATARACT EXTRACTION PHACO AND INTRAOCULAR LENS PLACEMENT (IOC);  Surgeon: Gemma Payor, MD;  Location: [facility];  Service: Ophthalmology;  Laterality: Left;  CDE: 4.98  . CATARACT EXTRACTION W/PHACO Right 10/21/2016   Procedure: CATARACT EXTRACTION PHACO AND INTRAOCULAR LENS PLACEMENT RIGHT EYE CDE - 6.24;  Surgeon: Nash Dimmer  Alto DenverHunt, MD;  Location: [facility];  Service: Ophthalmology;  Laterality: Right;  right  . CHOLECYSTECTOMY    . COLONOSCOPY  11/17/08   ext hemorrhoids  . CRANIOTOMY     secondary TBI  . CYST EXCISION     left wrist  . ESOPHAGOGASTRODUODENOSCOPY  04/05/10   [name] (70F),erosive reflux esophagitis, small hiatal hernia,  . FINGER SURGERY     removal cyst left pinky  . gastro empy study  03/03/10   normal  . HAND SURGERY    . NECK SURGERY  1998  . NEPHROLITHOTOMY  05/13/2012   Procedure: NEPHROLITHOTOMY PERCUTANEOUS;  Surgeon: Marcine MatarStephen Dahlstedt, MD;  Location: [facility];  Service: Urology;  Laterality: Left;      Social History   Occupational History  . Occupation: disabled    Associate Professormployer: UNEMPLOYED  Tobacco Use  . Smoking status: Never Smoker  . Smokeless tobacco: Never Used  Substance and Sexual Activity  . Alcohol use: No  . Drug use: No  . Sexual activity: Yes    Birth control/protection: Post-menopausal

## 2019-08-16 NOTE — Progress Notes (Signed)
Should this request to go to [facility].

## 2019-08-18 ENCOUNTER — Other Ambulatory Visit: Payer: Self-pay

## 2019-08-18 ENCOUNTER — Encounter
Payer: Worker's Compensation | Attending: Physical Medicine & Rehabilitation | Admitting: Physical Medicine & Rehabilitation

## 2019-08-18 ENCOUNTER — Encounter: Payer: Self-pay | Admitting: Physical Medicine & Rehabilitation

## 2019-08-18 DIAGNOSIS — M47818 Spondylosis without myelopathy or radiculopathy, sacral and sacrococcygeal region: Secondary | ICD-10-CM | POA: Diagnosis present

## 2019-08-18 DIAGNOSIS — G43711 Chronic migraine without aura, intractable, with status migrainosus: Secondary | ICD-10-CM | POA: Insufficient documentation

## 2019-08-18 DIAGNOSIS — M7918 Myalgia, other site: Secondary | ICD-10-CM | POA: Diagnosis present

## 2019-08-18 DIAGNOSIS — G4486 Cervicogenic headache: Secondary | ICD-10-CM

## 2019-08-18 DIAGNOSIS — R519 Headache, unspecified: Secondary | ICD-10-CM | POA: Insufficient documentation

## 2019-08-18 DIAGNOSIS — S7002XS Contusion of left hip, sequela: Secondary | ICD-10-CM | POA: Diagnosis present

## 2019-08-18 MED ORDER — TAPENTADOL HCL 50 MG PO TABS: 50.0000 mg | ORAL_TABLET | ORAL | 0 refills | Status: DC | PRN

## 2019-08-18 MED ORDER — METHADONE HCL 10 MG PO TABS: 20.0000 mg | ORAL_TABLET | Freq: Three times a day (TID) | ORAL | 0 refills | Status: DC

## 2019-08-18 MED ORDER — TIZANIDINE HCL 2 MG PO TABS: ORAL_TABLET | ORAL | 2 refills | Status: DC

## 2019-08-18 NOTE — Progress Notes (Signed)
Subjective:    Patient ID: Monica [name], female    DOB: 25-Sep-1950, 69 y.o.   MRN: [id number]  HPI   Monica [name] is here in follow up of her chronic pain. She has developed right sided hip and low back pain towards the end of November. She has been seen by orthopedics who is treating her for right SI jt arthritis. She had an U/S guided injection without any results  Her pelvic xrays demonstrate arthritis in the right SIJ joint. I reviewed these with the patient in person today.   She is walking with a cane for support.   She remains on methadone and nucynta for headache rx.   She still uses naproxen 500mg  q12 prn  Pain Inventory Average Pain 7 Pain Right Now 5 My pain is sharp, burning, stabbing and aching  In the last 24 hours, has pain interfered with the following? General activity 5 Relation with others 5 Enjoyment of life 4 What TIME of day is your pain at its worst? daytime Sleep (in general) Poor  Pain is worse with: walking and some activites Pain improves with: rest and heat/ice Relief from Meds: 6  Mobility walk without assistance use a cane  Function I need assistance with the following:  meal prep, household duties and shopping  Neuro/Psych No problems in this area  Prior Studies Any changes since last visit?  no  Physicians involved in your care Any changes since last visit?  no   Family History  Problem Relation Age of Onset  . Colon cancer Mother 40  . Heart disease Father   . Diabetes Father   . Diabetes Paternal Grandmother   . Heart disease Paternal Grandfather   . Diverticulitis Daughter    Social History   Socioeconomic History  . Marital status: Married    Spouse name: Not on file  . Number of children: 3  . Years of education: Not on file  . Highest education level: Not on file  Occupational History  . Occupation: disabled    69: UNEMPLOYED  Tobacco Use  . Smoking status: Never Smoker  . Smokeless tobacco: Never Used    Substance and Sexual Activity  . Alcohol use: No  . Drug use: No  . Sexual activity: Yes    Birth control/protection: Post-menopausal  Other Topics Concern  . Not on file  Social History Narrative  . Not on file   Social Determinants of Health   Financial Resource Strain:   . Difficulty of Paying Living Expenses: Not on file  Food Insecurity:   . Worried About Associate Professor in the Last Year: Not on file  . Ran Out of Food in the Last Year: Not on file  Transportation Needs:   . Lack of Transportation (Medical): Not on file  . Lack of Transportation (Non-Medical): Not on file  Physical Activity:   . Days of Exercise per Week: Not on file  . Minutes of Exercise per Session: Not on file  Stress:   . Feeling of Stress : Not on file  Social Connections:   . Frequency of Communication with Friends and Family: Not on file  . Frequency of Social Gatherings with Friends and Family: Not on file  . Attends Religious Services: Not on file  . Active Member of Clubs or Organizations: Not on file  . Attends Programme researcher, broadcasting/film/video Meetings: Not on file  . Marital Status: Not on file   Past Surgical History:  Procedure Laterality Date  .  BREAST LUMPECTOMY     left  . CATARACT EXTRACTION W/PHACO Left 10/07/2016   Procedure: CATARACT EXTRACTION PHACO AND INTRAOCULAR LENS PLACEMENT (IOC);  Surgeon: Tonny Branch, MD;  Location: [facility];  Service: Ophthalmology;  Laterality: Left;  CDE: 4.98  . CATARACT EXTRACTION W/PHACO Right 10/21/2016   Procedure: CATARACT EXTRACTION PHACO AND INTRAOCULAR LENS PLACEMENT RIGHT EYE CDE - 6.24;  Surgeon: Tonny Branch, MD;  Location: [facility];  Service: Ophthalmology;  Laterality: Right;  right  . CHOLECYSTECTOMY    . COLONOSCOPY  11/17/08   ext hemorrhoids  . CRANIOTOMY     secondary TBI  . CYST EXCISION     left wrist  . ESOPHAGOGASTRODUODENOSCOPY  04/05/10   [name] (7F),erosive reflux esophagitis, small hiatal hernia,  . FINGER SURGERY      removal cyst left pinky  . gastro empy study  03/03/10   normal  . HAND SURGERY    . NECK SURGERY  1998  . NEPHROLITHOTOMY  05/13/2012   Procedure: NEPHROLITHOTOMY PERCUTANEOUS;  Surgeon: Franchot Gallo, MD;  Location: [facility];  Service: Urology;  Laterality: Left;      Past Medical History:  Diagnosis Date  . Arthritis   . Cervical facet joint syndrome 04/22/2012  . Cervicalgia   . Chronic headaches    Dr Tessa Lerner [place] [facility]  . Chronic nausea    normal G[facility]   . Complication of anesthesia    had problem with local anesthesia-tried to assist Physician  . DM (diabetes mellitus) ([place])   . Elevated liver enzymes    hx of, normal 03/2010  . GERD (gastroesophageal reflux disease) 05/31/2011   History Schatzki's ring, erosive reflux esophagitis, status post last EGD and dilation 04/05/10   . Glaucoma    lens in both eyes  . Hemorrhoids 11/17/08   Colonoscopy Dr Laural Golden  . Migraine without aura, with intractable migraine, so stated, without mention of status migrainosus   . Occipital neuralgia   . PTSD (post-traumatic stress disorder)   . Renal lithiasis   . Seizure ([place])    12 yrs ago-med related  . Torticollis   . Unspecified musculoskeletal disorders and symptoms referable to neck    cervical/trapezius   BP (!) 143/80   Pulse 79   Temp 97.8 F (36.6 C)   Ht 5\' 4"  (1.626 m)   Wt 135 lb (61.2 kg)   SpO2 97%   BMI 23.17 kg/m   Opioid Risk Score:   Fall Risk Score:  `1  Depression screen PHQ 2/9  Depression screen [facility] 2/9 07/16/2019 06/29/2019 12/16/2018 10/19/2018 04/22/2018 10/15/2016 04/10/2016  Decreased Interest 0 1 1 1  0 3 3  Down, Depressed, Hopeless 0 1 1 1  0 3 3  PHQ - 2 Score 0 2 2 2  0 6 6  Altered sleeping - 3 - - - - -  Tired, decreased energy - 2 - - - - -  Change in appetite - 3 - - - - -  Feeling bad or failure about yourself  - 1 - - - - -  Trouble concentrating - 3 - - - - -  Moving slowly or fidgety/restless - 2 - - - - -  Suicidal thoughts - 0 - - -  - -  PHQ-9 Score - 16 - - - - -  Difficult doing work/chores - Not difficult at all - - - - -  Some recent data might be hidden     Review of Systems  Constitutional: Positive for  appetite change.  HENT: Negative.   Eyes: Negative.   Respiratory: Positive for shortness of breath.   Cardiovascular: Negative.   Gastrointestinal: Positive for diarrhea, nausea and vomiting.  Endocrine: Negative.   Genitourinary: Negative.   Musculoskeletal: Negative.   Skin: Negative.   Allergic/Immunologic: Negative.   Neurological: Negative.   Hematological: Negative.   Psychiatric/Behavioral: Negative.   All other systems reviewed and are negative.      Objective:   Physical Exam General: No acute distress HEENT: EOMI, oral membranes moist Cards: reg rate  Chest: normal effort Abdomen: Soft, NT, ND Skin: dry, intact Extremities: no edema  Neuro:Cranial nerve exam nonfocal. Motor 5 out of 5 in UE's. Difficult to assess RLE d/t pain. Normal sensory exam.    DTR are 2+. no clonus Patient balance stable/good Musc:limited low back and LE rom. Has difficulty lifting right leg. Cannot perform patrick's maneuver or figure 4. Compression test positive. Pain with palpation at right SIJ/PSIS. Pain only radiates down side of right leg to above the knee and also into anterior thigh to a lesser extent.  Psych:in good spirits. cooperative.  Assessment & Plan:   Assessment:  1. 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  2. Posttraumatic stress syndrome.  3. Greater occipital neuralgia (bilateral) confirmed by response to recent nerve block  4. 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 Continue methadone rx.RF today. #180 10mg  tablets We will continue the controlled substance monitoring program, this consists of regular clinic visits, examinations, routine drug  screening, pill counts as well as use of Controlled Substance Reporting System. [place] today.  -Continuemethadone at the 20 mg 3 times dailyschedule.  -dose reduction has been discussed -considerconversion to Nucynta controlled release - Nucynta 50 mg every 12 hours as needed #60RF today -naproxen for breakthrough pain              2.History of recentgreater occipital nerve RF with transient effects.  3. Continue topamax.   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  5.Reviewed right hip/lowback/SIJ pain. Recommend course of therapy to improve pelvic stability and ROM, posture. Modalities as indicated    -reviewed xrays with patient   -CONSIDER RF SI jt    -consider MRI lumbar spine 6.Continuezanaflex 2mg  q6 prn for spasm.generally uses in evenings. Continue with current schedule.  7.25  minutesof face to face patient care time were spent during this visit. All questions were encouraged and answered.F/u visit in about 2 months.

## 2019-08-18 NOTE — Patient Instructions (Signed)
PLEASE FEEL FREE TO CALL OUR OFFICE WITH ANY PROBLEMS OR QUESTIONS ([phone])      

## 2019-08-26 ENCOUNTER — Other Ambulatory Visit: Payer: Self-pay

## 2019-08-26 ENCOUNTER — Other Ambulatory Visit (HOSPITAL_COMMUNITY)
Admission: RE | Admit: 2019-08-26 | Discharge: 2019-08-26 | Disposition: A | Discharge status: Home / Self Care | Payer: Medicare Other | Source: Ambulatory Visit | Attending: Internal Medicine | Admitting: Internal Medicine

## 2019-08-26 ENCOUNTER — Encounter (HOSPITAL_COMMUNITY)
Admission: RE | Admit: 2019-08-26 | Discharge: 2019-08-26 | Disposition: A | Discharge status: Home / Self Care | Payer: Medicare Other | Source: Ambulatory Visit | Attending: Internal Medicine | Admitting: Internal Medicine

## 2019-08-26 DIAGNOSIS — Z01812 Encounter for preprocedural laboratory examination: Secondary | ICD-10-CM | POA: Diagnosis present

## 2019-08-26 DIAGNOSIS — Z20822 Contact with and (suspected) exposure to covid-19: Secondary | ICD-10-CM | POA: Diagnosis not present

## 2019-08-26 LAB — SARS CORONAVIRUS 2 (TAT 6-24 HRS): SARS Coronavirus 2: NEGATIVE

## 2019-08-26 NOTE — Patient Instructions (Signed)
Monica [name]  08/26/2019     @PREFPERIOPPHARMACY @   Your procedure is scheduled on  08/30/2019   Report to [facility] at  1315 (1:15)  P.M.  Call this number if you have problems the morning of surgery:  [phone]   Remember:  Follow the diet instructions given to you by Dr [phone].                       Take these medicines the morning of surgery with A SIP OF WATER Zanaflex(if needed), protonix, methadone(if needed).    Do not wear jewelry, make-up or nail polish.  Do not wear lotions, powders, or perfumes. Please wear deodorant and brush your teeth.  Do not shave 48 hours prior to surgery.  Men may shave face and neck.  Do not bring valuables to the hospital.  [facility] is not responsible for any belongings or valuables.  Contacts, dentures or bridgework may not be worn into surgery.  Leave your suitcase in the car.  After surgery it may be brought to your room.  For patients admitted to the hospital, discharge time will be determined by your treatment team.  Patients discharged the day of surgery will not be allowed to drive home.   Name and phone number of your driver:   family Special instructions:  DO NOT take any medications for diabetes the morning of your procedure.  Please read over the following fact sheets that you were given. Anesthesia Post-op Instructions and Care and Recovery After Surgery       Upper Endoscopy, Adult, Care After This sheet gives you information about how to care for yourself after your procedure. Your health care provider may also give you more specific instructions. If you have problems or questions, contact your health care provider. What can I expect after the procedure? After the procedure, it is common to have:  A sore throat.  Mild stomach pain or discomfort.  Bloating.  Nausea. Follow these instructions at home:   Follow instructions from your health care provider about what to eat or drink after  your procedure.  Return to your normal activities as told by your health care provider. Ask your health care provider what activities are safe for you.  Take over-the-counter and prescription medicines only as told by your health care provider.  Do not drive for 24 hours if you were given a sedative during your procedure.  Keep all follow-up visits as told by your health care provider. This is important. Contact a health care provider if you have:  A sore throat that lasts longer than one day.  Trouble swallowing. Get help right away if:  You vomit blood or your vomit looks like coffee grounds.  You have: ? A fever. ? Bloody, black, or tarry stools. ? A severe sore throat or you cannot swallow. ? Difficulty breathing. ? Severe pain in your chest or abdomen. Summary  After the procedure, it is common to have a sore throat, mild stomach discomfort, bloating, and nausea.  Do not drive for 24 hours if you were given a sedative during the procedure.  Follow instructions from your health care provider about what to eat or drink after your procedure.  Return to your normal activities as told by your health care provider. This information is not intended to replace advice given to you by your health care provider. Make sure you discuss any questions you  have with your health care provider. Document Revised: 01/20/2018 Document Reviewed: 12/29/2017 Elsevier Patient Education  [place].  Esophageal Dilatation Esophageal dilatation, also called esophageal dilation, is a procedure to widen or open (dilate) a blocked or narrowed part of the esophagus. The esophagus is the part of the body that moves food and liquid from the mouth to the stomach. You may need this procedure if:  You have a buildup of scar tissue in your esophagus that makes it difficult, painful, or impossible to swallow. This can be caused by gastroesophageal reflux disease (GERD).  You have cancer of the  esophagus.  There is a problem with how food moves through your esophagus. In some cases, you may need this procedure repeated at a later time to dilate the esophagus gradually. Tell a health care provider about:  Any allergies you have.  All medicines you are taking, including vitamins, herbs, eye drops, creams, and over-the-counter medicines.  Any problems you or family members have had with anesthetic medicines.  Any blood disorders you have.  Any surgeries you have had.  Any medical conditions you have.  Any antibiotic medicines you are required to take before dental procedures.  Whether you are pregnant or may be pregnant. What are the risks? Generally, this is a safe procedure. However, problems may occur, including:  Bleeding due to a tear in the lining of the esophagus.  A hole (perforation) in the esophagus. What happens before the procedure?  Follow instructions from your health care provider about eating or drinking restrictions.  Ask your health care provider about changing or stopping your regular medicines. This is especially important if you are taking diabetes medicines or blood thinners.  Plan to have someone take you home from the hospital or clinic.  Plan to have a responsible adult care for you for at least 24 hours after you leave the hospital or clinic. This is important. What happens during the procedure?  You may be given a medicine to help you relax (sedative).  A numbing medicine may be sprayed into the back of your throat, or you may gargle the medicine.  Your health care provider may perform the dilatation using various surgical instruments, such as: ? Simple dilators. This instrument is carefully placed in the esophagus to stretch it. ? Guided wire bougies. This involves using an endoscope to insert a wire into the esophagus. A dilator is passed over this wire to enlarge the esophagus. Then the wire is removed. ? Balloon dilators. An endoscope  with a small balloon at the end is inserted into the esophagus. The balloon is inflated to stretch the esophagus and open it up. The procedure may vary among health care providers and hospitals. What happens after the procedure?  Your blood pressure, heart rate, breathing rate, and blood oxygen level will be monitored until the medicines you were given have worn off.  Your throat may feel slightly sore and numb. This will improve slowly over time.  You will not be allowed to eat or drink until your throat is no longer numb.  When you are able to drink, urinate, and sit on the edge of the bed without nausea or dizziness, you may be able to return home. Follow these instructions at home:  Take over-the-counter and prescription medicines only as told by your health care provider.  Do not drive for 24 hours if you were given a sedative during your procedure.  You should have a responsible adult with you for 24  hours after the procedure.  Follow instructions from your health care provider about any eating or drinking restrictions.  Do not use any products that contain nicotine or tobacco, such as cigarettes and e-cigarettes. If you need help quitting, ask your health care provider.  Keep all follow-up visits as told by your health care provider. This is important. Get help right away if you:  Have a fever.  Have chest pain.  Have pain that is not relieved by medication.  Have trouble breathing.  Have trouble swallowing.  Vomit blood. Summary  Esophageal dilatation, also called esophageal dilation, is a procedure to widen or open (dilate) a blocked or narrowed part of the esophagus.  Plan to have someone take you home from the hospital or clinic.  For this procedure, a numbing medicine may be sprayed into the back of your throat, or you may gargle the medicine.  Do not drive for 24 hours if you were given a sedative during your procedure. This information is not intended to  replace advice given to you by your health care provider. Make sure you discuss any questions you have with your health care provider. Document Revised: 05/26/2019 Document Reviewed: 06/03/2017 Elsevier Patient Education  2020 [place] After These instructions provide you with information about caring for yourself after your procedure. Your health care provider may also give you more specific instructions. Your treatment has been planned according to current medical practices, but problems sometimes occur. Call your health care provider if you have any problems or questions after your procedure. What can I expect after the procedure? After your procedure, you may:  Feel sleepy for several hours.  Feel clumsy and have poor balance for several hours.  Feel forgetful about what happened after the procedure.  Have poor judgment for several hours.  Feel nauseous or vomit.  Have a sore throat if you had a breathing tube during the procedure. Follow these instructions at home: For at least 24 hours after the procedure:      Have a responsible adult stay with you. It is important to have someone help care for you until you are awake and alert.  Rest as needed.  Do not: ? Participate in activities in which you could fall or become injured. ? Drive. ? Use heavy machinery. ? Drink alcohol. ? Take sleeping pills or medicines that cause drowsiness. ? Make important decisions or sign legal documents. ? Take care of children on your own. Eating and drinking  Follow the diet that is recommended by your health care provider.  If you vomit, drink water, juice, or soup when you can drink without vomiting.  Make sure you have little or no nausea before eating solid foods. General instructions  Take over-the-counter and prescription medicines only as told by your health care provider.  If you have sleep apnea, surgery and certain medicines can increase  your risk for breathing problems. Follow instructions from your health care provider about wearing your sleep device: ? Anytime you are sleeping, including during daytime naps. ? While taking prescription pain medicines, sleeping medicines, or medicines that make you drowsy.  If you smoke, do not smoke without supervision.  Keep all follow-up visits as told by your health care provider. This is important. Contact a health care provider if:  You keep feeling nauseous or you keep vomiting.  You feel light-headed.  You develop a rash.  You have a fever. Get help right away if:  You have trouble  breathing. Summary  For several hours after your procedure, you may feel sleepy and have poor judgment.  Have a responsible adult stay with you for at least 24 hours or until you are awake and alert. This information is not intended to replace advice given to you by your health care provider. Make sure you discuss any questions you have with your health care provider. Document Revised: 10/27/2017 Document Reviewed: 11/19/2015 Elsevier Patient Education  [place].

## 2019-08-30 ENCOUNTER — Encounter (HOSPITAL_COMMUNITY): Payer: Self-pay | Admitting: Internal Medicine

## 2019-08-30 ENCOUNTER — Ambulatory Visit (HOSPITAL_COMMUNITY): Payer: Medicare Other | Admitting: Anesthesiology

## 2019-08-30 ENCOUNTER — Encounter (HOSPITAL_COMMUNITY)
Admission: RE | Disposition: A | Discharge status: Home / Self Care | Payer: Self-pay | Source: Home / Self Care | Attending: Internal Medicine

## 2019-08-30 ENCOUNTER — Ambulatory Visit (HOSPITAL_COMMUNITY)
Admission: RE | Admit: 2019-08-30 | Discharge: 2019-08-30 | Disposition: A | Discharge status: Home / Self Care | Payer: Medicare Other | Attending: Internal Medicine | Admitting: Internal Medicine

## 2019-08-30 DIAGNOSIS — Z87442 Personal history of urinary calculi: Secondary | ICD-10-CM | POA: Insufficient documentation

## 2019-08-30 DIAGNOSIS — Z79899 Other long term (current) drug therapy: Secondary | ICD-10-CM | POA: Diagnosis not present

## 2019-08-30 DIAGNOSIS — M5481 Occipital neuralgia: Secondary | ICD-10-CM | POA: Insufficient documentation

## 2019-08-30 DIAGNOSIS — R112 Nausea with vomiting, unspecified: Secondary | ICD-10-CM | POA: Insufficient documentation

## 2019-08-30 DIAGNOSIS — K2100 Gastro-esophageal reflux disease with esophagitis, without bleeding: Secondary | ICD-10-CM | POA: Insufficient documentation

## 2019-08-30 DIAGNOSIS — Z9842 Cataract extraction status, left eye: Secondary | ICD-10-CM | POA: Diagnosis not present

## 2019-08-30 DIAGNOSIS — R1310 Dysphagia, unspecified: Secondary | ICD-10-CM | POA: Diagnosis not present

## 2019-08-30 DIAGNOSIS — R197 Diarrhea, unspecified: Secondary | ICD-10-CM | POA: Insufficient documentation

## 2019-08-30 DIAGNOSIS — Z791 Long term (current) use of non-steroidal anti-inflammatories (NSAID): Secondary | ICD-10-CM | POA: Diagnosis not present

## 2019-08-30 DIAGNOSIS — H409 Unspecified glaucoma: Secondary | ICD-10-CM | POA: Insufficient documentation

## 2019-08-30 DIAGNOSIS — Z800 Family history of malignant neoplasm of digestive organs: Secondary | ICD-10-CM | POA: Diagnosis not present

## 2019-08-30 DIAGNOSIS — Z886 Allergy status to analgesic agent status: Secondary | ICD-10-CM | POA: Insufficient documentation

## 2019-08-30 DIAGNOSIS — G43909 Migraine, unspecified, not intractable, without status migrainosus: Secondary | ICD-10-CM | POA: Diagnosis not present

## 2019-08-30 DIAGNOSIS — Z9841 Cataract extraction status, right eye: Secondary | ICD-10-CM | POA: Diagnosis not present

## 2019-08-30 DIAGNOSIS — K209 Esophagitis, unspecified without bleeding: Secondary | ICD-10-CM

## 2019-08-30 DIAGNOSIS — F4310 Post-traumatic stress disorder, unspecified: Secondary | ICD-10-CM | POA: Insufficient documentation

## 2019-08-30 DIAGNOSIS — M436 Torticollis: Secondary | ICD-10-CM | POA: Insufficient documentation

## 2019-08-30 DIAGNOSIS — K5900 Constipation, unspecified: Secondary | ICD-10-CM | POA: Insufficient documentation

## 2019-08-30 DIAGNOSIS — Z7984 Long term (current) use of oral hypoglycemic drugs: Secondary | ICD-10-CM | POA: Insufficient documentation

## 2019-08-30 DIAGNOSIS — Z888 Allergy status to other drugs, medicaments and biological substances status: Secondary | ICD-10-CM | POA: Insufficient documentation

## 2019-08-30 DIAGNOSIS — Z7982 Long term (current) use of aspirin: Secondary | ICD-10-CM | POA: Diagnosis not present

## 2019-08-30 DIAGNOSIS — M1990 Unspecified osteoarthritis, unspecified site: Secondary | ICD-10-CM | POA: Diagnosis not present

## 2019-08-30 DIAGNOSIS — Z8379 Family history of other diseases of the digestive system: Secondary | ICD-10-CM | POA: Insufficient documentation

## 2019-08-30 DIAGNOSIS — Z9049 Acquired absence of other specified parts of digestive tract: Secondary | ICD-10-CM | POA: Diagnosis not present

## 2019-08-30 DIAGNOSIS — K222 Esophageal obstruction: Secondary | ICD-10-CM | POA: Diagnosis not present

## 2019-08-30 DIAGNOSIS — F419 Anxiety disorder, unspecified: Secondary | ICD-10-CM | POA: Insufficient documentation

## 2019-08-30 DIAGNOSIS — Z8249 Family history of ischemic heart disease and other diseases of the circulatory system: Secondary | ICD-10-CM | POA: Insufficient documentation

## 2019-08-30 DIAGNOSIS — K449 Diaphragmatic hernia without obstruction or gangrene: Secondary | ICD-10-CM | POA: Diagnosis not present

## 2019-08-30 DIAGNOSIS — E1140 Type 2 diabetes mellitus with diabetic neuropathy, unspecified: Secondary | ICD-10-CM | POA: Diagnosis not present

## 2019-08-30 DIAGNOSIS — F329 Major depressive disorder, single episode, unspecified: Secondary | ICD-10-CM | POA: Insufficient documentation

## 2019-08-30 DIAGNOSIS — Z833 Family history of diabetes mellitus: Secondary | ICD-10-CM | POA: Insufficient documentation

## 2019-08-30 HISTORY — PX: ESOPHAGOGASTRODUODENOSCOPY (EGD) WITH PROPOFOL: SHX5813

## 2019-08-30 HISTORY — PX: MALONEY DILATION: SHX5535

## 2019-08-30 LAB — GLUCOSE, CAPILLARY: Glucose-Capillary: 124 mg/dL — ABNORMAL HIGH (ref 70–99)

## 2019-08-30 SURGERY — ESOPHAGOGASTRODUODENOSCOPY (EGD) WITH PROPOFOL
Anesthesia: General

## 2019-08-30 MED ORDER — CHLORHEXIDINE GLUCONATE CLOTH 2 % EX PADS: 6.0000 | MEDICATED_PAD | Freq: Once | CUTANEOUS | Status: DC

## 2019-08-30 MED ORDER — PROPOFOL 10 MG/ML IV BOLUS
INTRAVENOUS | Status: DC | PRN
  Administered 2019-08-30 (×2): 20 mg via INTRAVENOUS

## 2019-08-30 MED ORDER — LACTATED RINGERS IV SOLN: INTRAVENOUS | Status: DC | PRN

## 2019-08-30 MED ORDER — KETAMINE HCL 50 MG/5ML IJ SOSY
PREFILLED_SYRINGE | INTRAMUSCULAR | Status: AC
  Filled 2019-08-30: qty 5

## 2019-08-30 MED ORDER — ONDANSETRON HCL 4 MG/2ML IJ SOLN
INTRAMUSCULAR | Status: DC | PRN
  Administered 2019-08-30: 4 mg via INTRAVENOUS

## 2019-08-30 MED ORDER — PROPOFOL 500 MG/50ML IV EMUL
INTRAVENOUS | Status: DC | PRN
  Administered 2019-08-30: 150 ug/kg/min via INTRAVENOUS

## 2019-08-30 MED ORDER — KETAMINE HCL 10 MG/ML IJ SOLN
INTRAMUSCULAR | Status: DC | PRN
  Administered 2019-08-30 (×2): 10 mg via INTRAVENOUS

## 2019-08-30 MED ORDER — LACTATED RINGERS IV SOLN: Freq: Once | INTRAVENOUS | Status: AC

## 2019-08-30 NOTE — Anesthesia Preprocedure Evaluation (Addendum)
Anesthesia Evaluation  Patient identified by MRN, date of birth, ID band Patient awake    Reviewed: Allergy & Precautions, NPO status , Patient's Chart, lab work & pertinent test results  History of Anesthesia Complications (+) history of anesthetic complications  Airway Mallampati: II  TM Distance: >3 FB Neck ROM: Full    Dental  (+) Missing, Chipped,  Upper front tooth was repaired, dental advisory was given:   Pulmonary shortness of breath and with exertion,    Pulmonary exam normal breath sounds clear to auscultation       Cardiovascular METS (as per patient poor exercise tolerance because of hip pain): Normal cardiovascular exam Rhythm:Regular Rate:Normal     Neuro/Psych  Headaches, Seizures - (last seizure 20 years ago), Well Controlled,  PSYCHIATRIC DISORDERS Anxiety Depression  Neuromuscular disease    GI/Hepatic GERD  ,  Endo/Other  diabetes (blood sugar was in 500s in 06/2019), Well Controlled, Type 2, Oral Hypoglycemic Agents  Renal/GU Renal disease     Musculoskeletal  (+) Arthritis , Osteoarthritis,  Chronic cervical radicular pain   Abdominal   Peds  Hematology   Anesthesia Other Findings   Reproductive/Obstetrics                            Anesthesia Physical Anesthesia Plan  ASA: III  Anesthesia Plan: General   Post-op Pain Management:    Induction: Intravenous  PONV Risk Score and Plan: 0 and TIVA  Airway Management Planned: Nasal Cannula and Natural Airway  Additional Equipment:   Intra-op Plan:   Post-operative Plan:   Informed Consent: I have reviewed the patients History and Physical, chart, labs and discussed the procedure including the risks, benefits and alternatives for the proposed anesthesia with the patient or authorized representative who has indicated his/her understanding and acceptance.     Dental advisory given  Plan Discussed with:  CRNA  Anesthesia Plan Comments:         Anesthesia Quick Evaluation

## 2019-08-30 NOTE — Progress Notes (Signed)
From EGD procedure to PACU. Awake. Crying. Wants to see [name] (husband). Oriented to place per nurse. Reassurance given. Unable to console pt.

## 2019-08-30 NOTE — Progress Notes (Signed)
Resting quietly at intervals. Continues crying at intervals. Continues to want to see [name]. Reassurance given. Unable to console pt. Wants to go home. Transferred to SDS postop in good condition.

## 2019-08-30 NOTE — Discharge Instructions (Signed)
Gastroesophageal Reflux Disease, Adult Gastroesophageal reflux (GER) happens when acid from the stomach flows up into the tube that connects the mouth and the stomach (esophagus). Normally, food travels down the esophagus and stays in the stomach to be digested. However, when a person has GER, food and stomach acid sometimes move back up into the esophagus. If this becomes a more serious problem, the person may be diagnosed with a disease called gastroesophageal reflux disease (GERD). GERD occurs when the reflux:  Happens often.  Causes frequent or severe symptoms.  Causes problems such as damage to the esophagus. When stomach acid comes in contact with the esophagus, the acid may cause soreness (inflammation) in the esophagus. Over time, GERD may create small holes (ulcers) in the lining of the esophagus. What are the causes? This condition is caused by a problem with the muscle between the esophagus and the stomach (lower esophageal sphincter, or LES). Normally, the LES muscle closes after food passes through the esophagus to the stomach. When the LES is weakened or abnormal, it does not close properly, and that allows food and stomach acid to go back up into the esophagus. The LES can be weakened by certain dietary substances, medicines, and medical conditions, including:  Tobacco use.  Pregnancy.  Having a hiatal hernia.  Alcohol use.  Certain foods and beverages, such as coffee, chocolate, onions, and peppermint. What increases the risk? You are more likely to develop this condition if you:  Have an increased body weight.  Have a connective tissue disorder.  Use NSAID medicines. What are the signs or symptoms? Symptoms of this condition include:  Heartburn.  Difficult or painful swallowing.  The feeling of having a lump in the throat.  Abitter taste in the mouth.  Bad breath.  Having a large amount of saliva.  Having an upset or bloated  stomach.  Belching.  Chest pain. Different conditions can cause chest pain. Make sure you see your health care provider if you experience chest pain.  Shortness of breath or wheezing.  Ongoing (chronic) cough or a night-time cough.  Wearing away of tooth enamel.  Weight loss. How is this diagnosed? Your health care provider will take a medical history and perform a physical exam. To determine if you have mild or severe GERD, your health care provider may also monitor how you respond to treatment. You may also have tests, including:  A test to examine your stomach and esophagus with a small camera (endoscopy).  A test thatmeasures the acidity level in your esophagus.  A test thatmeasures how much pressure is on your esophagus.  A barium swallow or modified barium swallow test to show the shape, size, and functioning of your esophagus. How is this treated? The goal of treatment is to help relieve your symptoms and to prevent complications. Treatment for this condition may vary depending on how severe your symptoms are. Your health care provider may recommend:  Changes to your diet.  Medicine.  Surgery. Follow these instructions at home: Eating and drinking   Follow a diet as recommended by your health care provider. This may involve avoiding foods and drinks such as: ? Coffee and tea (with or without caffeine). ? Drinks that containalcohol. ? Energy drinks and sports drinks. ? Carbonated drinks or sodas. ? Chocolate and cocoa. ? Peppermint and mint flavorings. ? Garlic and onions. ? Horseradish. ? Spicy and acidic foods, including peppers, chili powder, curry powder, vinegar, hot sauces, and barbecue sauce. ? Citrus fruit juices and citrus   fruits, such as oranges, lemons, and limes. ? Tomato-based foods, such as red sauce, chili, salsa, and pizza with red sauce. ? Fried and fatty foods, such as donuts, french fries, potato chips, and high-fat dressings. ? High-fat  meats, such as hot dogs and fatty cuts of red and white meats, such as rib eye steak, sausage, ham, and bacon. ? High-fat dairy items, such as whole milk, butter, and cream cheese.  Eat small, frequent meals instead of large meals.  Avoid drinking large amounts of liquid with your meals.  Avoid eating meals during the 2-3 hours before bedtime.  Avoid lying down right after you eat.  Do not exercise right after you eat. Lifestyle   Do not use any products that contain nicotine or tobacco, such as cigarettes, e-cigarettes, and chewing tobacco. If you need help quitting, ask your health care provider.  Try to reduce your stress by using methods such as yoga or meditation. If you need help reducing stress, ask your health care provider.  If you are overweight, reduce your weight to an amount that is healthy for you. Ask your health care provider for guidance about a safe weight loss goal. General instructions  Pay attention to any changes in your symptoms.  Take over-the-counter and prescription medicines only as told by your health care provider. Do not take aspirin, ibuprofen, or other NSAIDs unless your health care provider told you to do so.  Wear loose-fitting clothing. Do not wear anything tight around your waist that causes pressure on your abdomen.  Raise (elevate) the head of your bed about 6 inches (15 cm).  Avoid bending over if this makes your symptoms worse.  Keep all follow-up visits as told by your health care provider. This is important. Contact a health care provider if:  You have: ? New symptoms. ? Unexplained weight loss. ? Difficulty swallowing or it hurts to swallow. ? Wheezing or a persistent cough. ? A hoarse voice.  Your symptoms do not improve with treatment. Get help right away if you:  Have pain in your arms, neck, jaw, teeth, or back.  Feel sweaty, dizzy, or light-headed.  Have chest pain or shortness of breath.  Vomit and your vomit looks  like blood or coffee grounds.  Faint.  Have stool that is bloody or black.  Cannot swallow, drink, or eat. Summary  Gastroesophageal reflux happens when acid from the stomach flows up into the esophagus. GERD is a disease in which the reflux happens often, causes frequent or severe symptoms, or causes problems such as damage to the esophagus.  Treatment for this condition may vary depending on how severe your symptoms are. Your health care provider may recommend diet and lifestyle changes, medicine, or surgery.  Contact a health care provider if you have new or worsening symptoms.  Take over-the-counter and prescription medicines only as told by your health care provider. Do not take aspirin, ibuprofen, or other NSAIDs unless your health care provider told you to do so.  Keep all follow-up visits as told by your health care provider. This is important. This information is not intended to replace advice given to you by your health care provider. Make sure you discuss any questions you have with your health care provider. Document Revised: 02/04/2018 Document Reviewed: 02/04/2018 Elsevier Patient Education  [place]. EGD Discharge instructions Please read the instructions outlined below and refer to this sheet in the next few weeks. These discharge instructions provide you with general information on caring for  yourself after you leave the hospital. Your doctor may also give you specific instructions. While your treatment has been planned according to the most current medical practices available, unavoidable complications occasionally occur. If you have any problems or questions after discharge, please call your doctor. ACTIVITY  You may resume your regular activity but move at a slower pace for the next 24 hours.   Take frequent rest periods for the next 24 hours.   Walking will help expel (get rid of) the air and reduce the bloated feeling in your abdomen.   No driving for  24 hours (because of the anesthesia (medicine) used during the test).   You may shower.   Do not sign any important legal documents or operate any machinery for 24 hours (because of the anesthesia used during the test).  NUTRITION  Drink plenty of fluids.   You may resume your normal diet.   Begin with a light meal and progress to your normal diet.   Avoid alcoholic beverages for 24 hours or as instructed by your caregiver.  MEDICATIONS  You may resume your normal medications unless your caregiver tells you otherwise.  WHAT YOU CAN EXPECT TODAY  You may experience abdominal discomfort such as a feeling of fullness or "gas" pains.  FOLLOW-UP  Your doctor will discuss the results of your test with you.  SEEK IMMEDIATE MEDICAL ATTENTION IF ANY OF THE FOLLOWING OCCUR:  Excessive nausea (feeling sick to your stomach) and/or vomiting.   Severe abdominal pain and distention (swelling).   Trouble swallowing.   Temperature over 101 F (37.8 C).   Rectal bleeding or vomiting of blood.   GERD information provided  No more Protonix  Begin AcipHex 20 mg daily for reflux  Office visit with [place] in 3 months  If diarrhea does not improve let us know  At patient request, I called her husband at [phone] and reviewed results.

## 2019-08-30 NOTE — Anesthesia Postprocedure Evaluation (Signed)
Anesthesia Post Note  Patient: Rai L Haislip  Procedure(s) Performed: ESOPHAGOGASTRODUODENOSCOPY (EGD) WITH PROPOFOL (N/A ) MALONEY DILATION (N/A )  Patient location during evaluation: PACU Anesthesia Type: General Level of consciousness: awake and alert and oriented Pain management: pain level controlled Vital Signs Assessment: post-procedure vital signs reviewed and stable Respiratory status: spontaneous breathing Cardiovascular status: blood pressure returned to baseline and stable Postop Assessment: no apparent nausea or vomiting Anesthetic complications: no     Last Vitals:  Vitals:   08/30/19 1039 08/30/19 1307  BP: 124/72 140/80  Pulse: 67 87  Resp: (!) 23 16  Temp: 36.9 C   SpO2: 100% 95%    Last Pain:  Vitals:   08/30/19 1039  TempSrc: Oral  PainSc: 10-Worst pain ever                 Nereyda Bowler

## 2019-08-30 NOTE — Interval H&P Note (Signed)
History and Physical Interval Note:  08/30/2019 12:35 PM  Monica [name]  has presented today for surgery, with the diagnosis of dysphagia.  The various methods of treatment have been discussed with the patient and family. After consideration of risks, benefits and other options for treatment, the patient has consented to  Procedure(s) with comments: ESOPHAGOGASTRODUODENOSCOPY (EGD) WITH PROPOFOL (N/A) - 3:30pm - pt knows to arrive at 9:45 MALONEY DILATION (N/A) as a surgical intervention.  The patient's history has been reviewed, patient examined, no change in status, stable for surgery.  I have reviewed the patient's chart and labs.  Questions were answered to the patient's satisfaction.     Yannis Broce   No change.  EGD with esophageal dilation as feasible/appropriate.  Diarrhea with Protonix recently. The risks, benefits, limitations, alternatives and imponderables have been reviewed with the patient. Potential for esophageal dilation, biopsy, etc. have also been reviewed.  Questions have been answered. All parties agreeable.

## 2019-08-30 NOTE — Op Note (Signed)
[facility] Patient Name: Monica [name] Procedure Date: 08/30/2019 11:36 AM MRN: [id number] Date of Birth: 03-21-1951 Attending MD: Norvel Richards , MD CSN: [id number] Age: 69 Admit Type: Outpatient Procedure:                Upper GI endoscopy Indications:              Dysphagia Providers:                Norvel Richards, MD, Hinton Rao, RN, Nelma Rothman, Technician Referring MD:             Loman Brooklyn Medicines:                Propofol per Anesthesia Complications:            No immediate complications. Estimated Blood Loss:     Estimated blood loss was minimal. Estimated blood                            loss was minimal. Procedure:                Pre-Anesthesia Assessment:                           - Prior to the procedure, a History and Physical                            was performed, and patient medications and                            allergies were reviewed. The patient's tolerance of                            previous anesthesia was also reviewed. The risks                            and benefits of the procedure and the sedation                            options and risks were discussed with the patient.                            All questions were answered, and informed consent                            was obtained. Prior Anticoagulants: The patient has                            taken no previous anticoagulant or antiplatelet                            agents. ASA Grade Assessment: II - A patient with  mild systemic disease. After reviewing the risks                            and benefits, the patient was deemed in                            satisfactory condition to undergo the procedure.                           After obtaining informed consent, the endoscope was                            passed under direct vision. Throughout the                            procedure, the patient's blood  pressure, pulse, and                            oxygen saturations were monitored continuously. The                            [id number]-[id number] ([id number]) scope was introduced through the                            mouth, and advanced to the second part of duodenum.                            The upper GI endoscopy was accomplished without                            difficulty. The patient tolerated the procedure                            well. Scope In: 12:53:21 PM Scope Out: 12:59:00 PM Total Procedure Duration: 0 hours 5 minutes 39 seconds  Findings:      Schatzki's ring with a component of a peptic stricture straddling the GE       junction. Overlying esophageal erosions were present within 5 mm of the       GE junction. No Barrett's epithelium or tumor seen. . Esophagus remain       patent at this level.      A small hiatal hernia was present.      The exam was otherwise without abnormality.      The duodenal bulb and second portion of the duodenum were normal. The       scope was withdrawn. Dilation was performed with a Maloney dilator with       mild resistance at 54 Fr. The dilation site was examined following       endoscope reinsertion and showed moderate mucosal disruption. Estimated       blood loss was minimal. Impression:               -Schatzki's ring, peptic stricture, reflux                            esophagitis esophagitis. Dilated.                           -  Small hiatal hernia.                           - The examination was otherwise normal.                           - Normal duodenal bulb and second portion of the                            duodenum.                           - No specimens collected. Moderate Sedation:      Moderate (conscious) sedation was personally administered by an       anesthesia professional. The following parameters were monitored: oxygen       saturation, heart rate, blood pressure, respiratory rate, EKG, adequacy       of pulmonary  ventilation, and response to care.      Moderate (conscious) sedation was administered by the endoscopy nurse       and supervised by the endoscopist. The following parameters were       monitored: oxygen saturation, heart rate, blood pressure, and response       to care. Recommendation:           - Patient has a contact number available for                            emergencies. The signs and symptoms of potential                            delayed complications were discussed with the                            patient. Return to normal activities tomorrow.                            Written discharge instructions were provided to the                            patient.                           - Resume previous diet.                           - Continue present medications. Stop Protonix.                            Begin AcipHex 20 mg daily.                           - Return to my office in 3 months. Procedure Code(s):        --- Professional ---                           [phone], Esophagogastroduodenoscopy, flexible,  transoral; diagnostic, including collection of                            specimen(s) by brushing or washing, when performed                            (separate procedure)                           43450, Dilation of esophagus, by unguided sound or                            bougie, single or multiple passes Diagnosis Code(s):        --- Professional ---                           K20.90, Esophagitis, unspecified without bleeding                           K44.9, Diaphragmatic hernia without obstruction or                            gangrene                           R13.10, Dysphagia, unspecified CPT copyright 2019 [employer]. All rights reserved. The codes documented in this report are preliminary and upon coder review may  be revised to meet current compliance requirements. Gerrit Friends. Kailany Dinunzio, MD Gennette Pac,  MD 08/30/2019 1:11:13 PM This report has been signed electronically. Number of Addenda: 0

## 2019-08-30 NOTE — Transfer of Care (Signed)
Immediate Anesthesia Transfer of Care Note  Patient: Monica [name]  Procedure(s) Performed: ESOPHAGOGASTRODUODENOSCOPY (EGD) WITH PROPOFOL (N/A ) MALONEY DILATION (N/A )  Patient Location: PACU  Anesthesia Type:General  Level of Consciousness: awake  Airway & Oxygen Therapy: Patient Spontanous Breathing  Post-op Assessment: Report given to RN  Post vital signs: Reviewed  Last Vitals:  Vitals Value Taken Time  BP 140/80 08/30/19 1307  Temp    Pulse 74 08/30/19 1311  Resp 18 08/30/19 1311  SpO2 100 % 08/30/19 1311  Vitals shown include unvalidated device data.  Last Pain:  Vitals:   08/30/19 1039  TempSrc: Oral  PainSc: 10-Worst pain ever      Patients Stated Pain Goal: 10 (08/30/19 1039)  Complications: No apparent anesthesia complications

## 2019-09-01 ENCOUNTER — Ambulatory Visit: Payer: Medicare Other | Admitting: Surgery

## 2019-09-03 ENCOUNTER — Telehealth: Payer: Self-pay

## 2019-09-03 NOTE — Telephone Encounter (Signed)
PA has been started through [email or web address] for Rabeprazole. Waiting on an approval or denial.

## 2019-09-08 ENCOUNTER — Ambulatory Visit: Payer: Self-pay

## 2019-09-08 ENCOUNTER — Other Ambulatory Visit: Payer: Self-pay

## 2019-09-08 ENCOUNTER — Encounter: Payer: Self-pay | Admitting: Surgery

## 2019-09-08 ENCOUNTER — Ambulatory Visit (INDEPENDENT_AMBULATORY_CARE_PROVIDER_SITE_OTHER): Payer: Medicare Other | Admitting: Surgery

## 2019-09-08 VITALS — Ht 64.0 in | Wt 135.0 lb

## 2019-09-08 DIAGNOSIS — M25561 Pain in right knee: Secondary | ICD-10-CM

## 2019-09-08 DIAGNOSIS — M5416 Radiculopathy, lumbar region: Secondary | ICD-10-CM

## 2019-09-08 NOTE — Progress Notes (Signed)
Office Visit Note   Patient: Monica [name]           Date of Birth: 10-30-1950           MRN: [id number] Visit Date: 09/08/2019              Requested by: [name], FNP [address] [place],  [place] [address] PCP: [name], FNP   Assessment & Plan: Visit Diagnoses:  1. Acute pain of right knee   2. Radiculopathy, lumbar region     Plan: Since patient continues to have ongoing low back pain and right lower extremity radiculopathy that has failed conservative treatment at this point I recommend proceeding with lumbar MRI to rule out HNP/stenosis.  Follow-up with Dr. Ophelia Charter after completion to discuss results and further treatment options.  Advised patient that the knee pain that she is describing I feel is mostly related to chronic lumbar spine issues.  We will monitor this.  Follow-Up Instructions: Return in about 3 weeks (around 09/29/2019) for with dr yates to review lumbar mri.   Orders:  Orders Placed This Encounter  Procedures  . XR KNEE 3 VIEW RIGHT  . MR Lumbar Spine w/o contrast   No orders of the defined types were placed in this encounter.     Procedures: No procedures performed   Clinical Data: No additional findings.   Subjective: Chief Complaint  Patient presents with  . Right Hip - Pain    HPI 69 year old white female returns for recheck of low back pain, right lower extremity radiculopathy and right SI joint pain.  Previous right SI joint injection gave her some improvement for a few hours.  She continues have ongoing pain in the right low back that radiates down the right leg.  She feels like the pain extending down into her knee is getting worse.  Right leg pain is constant.  Patient is followed by [name] who recommended that she try formal PT for her lumbar spine issues but states that she can only go to one visit due to it increasing her pain.  Not complaining of any left leg issues. Review of Systems No current cardiac  pulmonary GI GU issues  Objective: Vital Signs: Ht 5\' 4"  (1.626 m)   Wt 135 lb (61.2 kg)   BMI 23.17 kg/m   Physical Exam Pleasant female alert and oriented in no acute distress.  Gait is antalgic.  Negative logroll bilateral hips.  Positive right straight leg raise.  Negative on the left side.  Trace right anterior tib weakness. Ortho Exam  Specialty Comments:  No specialty comments available.  Imaging: No results found.   PMFS History: Patient Active Problem List   Diagnosis Date Noted  . Arthritis of right sacroiliac joint 08/18/2019  . Dysphagia 08/03/2019  . Altered bowel habits 08/03/2019  . Trochanteric bursitis, left hip 07/20/2019  . Diabetes mellitus (HCC) 06/17/2019  . PTSD (post-traumatic stress disorder) 10/15/2016  . Pain management 10/06/2015  . Encounter for therapeutic drug level monitoring 10/02/2015  . Occipital neuralgia (Location of Primary Source of Pain) (Bilateral) (L>R) 10/02/2015  . Chronic cervical radicular pain (Left) 08/31/2015  . 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 Cervical facet arthropathy (severe at C3-4) (Bilateral) (L>R) 07/26/2015  . Cervical foraminal stenosis (C3-4) (Left) 07/26/2015  . Cervical spondylosis 06/13/2015  . Cervical facet syndrome (Location of Secondary source of pain) (Bilateral) (L>R) 06/13/2015  . Chronic pain 06/13/2015  . Cervicogenic headache (  Location of Primary Source of Pain) (Bilateral) (L>R) 06/13/2015  . Chronic neck pain (Location of Secondary source of pain) (Bilateral) (L>R) 06/13/2015  . Long term current use of opiate analgesic 06/13/2015  . Opiate use (600 MME/Day) 06/13/2015  . Opiate dependence (HCC) 06/13/2015  . Myofascial muscle pain 04/19/2013  . Dyspnea 02/10/2013  . Intractable chronic migraine without aura and with status migrainosus 04/22/2012  . Depression 04/22/2012  . Borderline personality disorder (HCC) 04/22/2012  . GERD (gastroesophageal reflux disease)  05/31/2011   Past Medical History:  Diagnosis Date  . Arthritis   . Cervical facet joint syndrome 04/22/2012  . Cervicalgia   . Chronic headaches    Dr Hermelinda Medicus [place] [facility]ist  . Chronic nausea    normal GES   . Complication of anesthesia    had problem with local anesthesia-tried to assist Physician  . DM (diabetes mellitus) (HCC)   . Elevated liver enzymes    hx of, normal 03/2010  . GERD (gastroesophageal reflux disease) 05/31/2011   History Schatzki's ring, erosive reflux esophagitis, status post last EGD and dilation 04/05/10   . Glaucoma    lens in both eyes  . Hemorrhoids 11/17/08   Colonoscopy Dr Karilyn Cota  . Migraine without aura, with intractable migraine, so stated, without mention of status migrainosus   . Occipital neuralgia   . PTSD (post-traumatic stress disorder)   . Renal lithiasis   . 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 Torticollis   . Unspecified musculoskeletal disorders and symptoms referable to neck    cervical/trapezius    Family History  Problem Relation Age of Onset  . Colon cancer Mother 71  . Heart disease Father   . Diabetes Father   . Diabetes Paternal Grandmother   . Heart disease Paternal Grandfather   . Diverticulitis Daughter     Past Surgical History:  Procedure Laterality Date  . BREAST LUMPECTOMY     left  . CATARACT EXTRACTION W/PHACO Left 10/07/2016   Procedure: CATARACT EXTRACTION PHACO AND INTRAOCULAR LENS PLACEMENT (IOC);  Surgeon: Gemma Payor, MD;  Location: [facility];  Service: Ophthalmology;  Laterality: Left;  CDE: 4.98  . CATARACT EXTRACTION W/PHACO Right 10/21/2016   Procedure: CATARACT EXTRACTION PHACO AND INTRAOCULAR LENS PLACEMENT RIGHT EYE CDE - 6.24;  Surgeon: Gemma Payor, MD;  Location: [facility];  Service: Ophthalmology;  Laterality: Right;  right  . CHOLECYSTECTOMY    . COLONOSCOPY  11/17/08   ext hemorrhoids  . CRANIOTOMY     secondary TBI  . CYST EXCISION     left wrist  . ESOPHAGOGASTRODUODENOSCOPY  04/05/10    [name] (49F),erosive reflux esophagitis, small hiatal hernia,  . ESOPHAGOGASTRODUODENOSCOPY (EGD) WITH PROPOFOL N/A 08/30/2019   Procedure: ESOPHAGOGASTRODUODENOSCOPY (EGD) WITH PROPOFOL;  Surgeon: Corbin Ade, MD;  Location: [facility];  Service: Endoscopy;  Laterality: N/A;  3:30pm - pt knows to arrive at 9:45  . FINGER SURGERY     removal cyst left pinky  . gastro empy study  03/03/10   normal  . HAND SURGERY    . MALONEY DILATION N/A 08/30/2019   Procedure: Elease Hashimoto DILATION;  Surgeon: Corbin Ade, MD;  Location: [facility];  Service: Endoscopy;  Laterality: N/A;  . NECK SURGERY  1998  . NEPHROLITHOTOMY  05/13/2012   Procedure: NEPHROLITHOTOMY PERCUTANEOUS;  Surgeon: Marcine Matar, MD;  Location: [facility];  Service: Urology;  Laterality: Left;      Social History   Occupational History  .  Occupation: disabled    Fish farm manager: UNEMPLOYED  Tobacco Use  . Smoking status: Never Smoker  . Smokeless tobacco: Never Used  Substance and Sexual Activity  . Alcohol use: No  . Drug use: No  . Sexual activity: Yes    Birth control/protection: Post-menopausal

## 2019-09-16 ENCOUNTER — Ambulatory Visit: Payer: Medicare Other | Admitting: Gastroenterology

## 2019-09-21 ENCOUNTER — Encounter: Payer: Self-pay | Admitting: Family Medicine

## 2019-09-21 ENCOUNTER — Ambulatory Visit (INDEPENDENT_AMBULATORY_CARE_PROVIDER_SITE_OTHER): Payer: Medicare Other | Admitting: Family Medicine

## 2019-09-21 DIAGNOSIS — B029 Zoster without complications: Secondary | ICD-10-CM

## 2019-09-21 MED ORDER — VALACYCLOVIR HCL 1 G PO TABS: 1000.0000 mg | ORAL_TABLET | Freq: Three times a day (TID) | ORAL | 0 refills | Status: AC

## 2019-09-21 NOTE — Progress Notes (Signed)
Virtual Visit via Telephone Note  I connected with Monica [name] on 09/21/19 at 9:46 AM by telephone and verified that I am speaking with the correct person using two identifiers. Monica [name] is currently located at home and nobody is currently with her during this visit. The provider, Loman Brooklyn, FNP is located in their office at time of visit.  I discussed the limitations, risks, security and privacy concerns of performing an evaluation and management service by telephone and the availability of in person appointments. I also discussed with the patient that there may be a patient responsible charge related to this service. The patient expressed understanding and agreed to proceed.  Subjective: PCP: Loman Brooklyn, FNP  Chief Complaint  Patient presents with  . Rash   Patient reports she woke up yesterday morning with a rash on the left side of her head around her ear and her neck.  There is clear drainage present.  Patient reports her skin is very sensitive to the touch and painful.  When asked to describe pain she states it is everything from aching to burning, stabbing, shooting, and tingling.  Also reports she feels like her hearing is not quite right.  Patient had Zostavax on 11/03/2013 but has not had Shingrix.  She reports she has had a lot of nerve problems going on that are affecting the right side of her body recently.  She is currently using a cane.   ROS: Per HPI  Current Outpatient Medications:  .  diphenhydrAMINE (BENADRYL) 25 MG tablet, Take 25 mg by mouth every 6 (six) hours as needed for allergies., Disp: , Rfl:  .  metFORMIN (GLUCOPHAGE) 500 MG tablet, Take 500 mg once daily x1 week, add one tablet weekly until you are taking 1,000 mg twice daily. Take with meals. (Patient taking differently: Take 1,000 mg by mouth 2 (two) times daily. ), Disp: 360 tablet, Rfl: 0 .  methadone (DOLOPHINE) 10 MG tablet, Take 2 tablets (20 mg total) by mouth 3 (three) times  daily., Disp: 180 tablet, Rfl: 0 .  naproxen (NAPROSYN) 500 MG tablet, Take 1 tablet (500 mg total) by mouth 2 (two) times daily with a meal., Disp: 60 tablet, Rfl: 6 .  pantoprazole (PROTONIX) 40 MG tablet, Take 1 tablet (40 mg total) by mouth daily. 30 minutes before breakfast, Disp: 30 tablet, Rfl: 3 .  promethazine (PHENERGAN) 25 MG tablet, TAKE 1 TABLET BY MOUTH EVERY 8 HOURS AS NEEDED FOR NAUSEA & VOMITING. (Patient taking differently: Take 25 mg by mouth every 8 (eight) hours as needed for nausea or vomiting. ), Disp: 60 tablet, Rfl: 3 .  tapentadol (NUCYNTA) 50 MG tablet, Take 1 tablet (50 mg total) by mouth as needed. (Patient taking differently: Take 50 mg by mouth daily as needed (migraines). ), Disp: 60 tablet, Rfl: 0 .  tiZANidine (ZANAFLEX) 2 MG tablet, TAKE (1) TABLET EVERY SIX HOURS AS NEEDED FOR MUSCLE SPASMS. (Patient taking differently: Take 2 mg by mouth every 6 (six) hours as needed for muscle spasms. ), Disp: 60 tablet, Rfl: 2 .  topiramate (TOPAMAX) 50 MG tablet, TAKE 2 TABLETS IN THE MORNING AND 4 TABLETS AT BEDTIME. (Patient taking differently: Take 100-200 mg by mouth See admin instructions. Take 100 mg by mouth in the morning and 200 mg in the evening), Disp: 180 tablet, Rfl: 2  Allergies  Allergen Reactions  . Aspirin Anaphylaxis and Hives    REACTION: slurred speech, blurred vision  . Hydromorphone Hcl  Shortness Of Breath    severe  . Vimpat [Lacosamide] Other (See Comments)    Hallucinations, out of body experience, confusion   . Capsaicin     Red rash  . Depakote [Divalproex Sodium]   . Milnacipran Hcl Other (See Comments)    dizziness  . Pristiq [Desvenlafaxine Succinate Monohydrate] Other (See Comments)    dizziness   Past Medical History:  Diagnosis Date  . Arthritis   . Cervical facet joint syndrome 04/22/2012  . Cervicalgia   . Chronic headaches    Dr Hermelinda [facility]us [place] [facility]  . Chronic nausea    normal GES   . Complication of anesthesia    had  problem with local anesthesia-tried to assist Physician  . DM (diabetes mellitus) (HCC)   . Elevated liver enzymes    hx of, normal 03/2010  . GERD (gastroesophageal reflux disease) 05/31/2011   History Schatzki's ring, erosive reflux esophagitis, status post last EGD and dilation 04/05/10   . Glaucoma    lens in both eyes  . Hemorrhoids 11/17/08   Colonoscopy Dr Karilyn Cota  . Migraine without aura, with intractable migraine, so stated, without mention of status migrainosus   . Occipital neuralgia   . PTSD (post-traumatic stress disorder)   . Renal lithiasis   . 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 Torticollis   . Unspecified musculoskeletal disorders and symptoms referable to neck    cervical/trapezius    Observations/Objective: A&O  No respiratory distress or wheezing audible over the phone Judgement and thought processes all WNL. Tearful.   Assessment and Plan: 1. Herpes zoster without complication - Encouraged lots of handwashing as she is contagious as long as there is drainage. - valACYclovir (VALTREX) 1000 MG tablet; Take 1 tablet (1,000 mg total) by mouth every 8 (eight) hours for 7 days.  Dispense: 21 tablet; Refill: 0   Follow Up Instructions:  I discussed the assessment and treatment plan with the patient. The patient was provided an opportunity to ask questions and all were answered. The patient agreed with the plan and demonstrated an understanding of the instructions.   The patient was advised to call back or seek an in-person evaluation if the symptoms worsen or if the condition fails to improve as anticipated.  The above assessment and management plan was discussed with the patient. The patient verbalized understanding of and has agreed to the management plan. Patient is aware to call the clinic if symptoms persist or worsen. Patient is aware when to return to the clinic for a follow-up visit. Patient educated on when it is appropriate to go to the emergency  department.   Time call ended: 10:02 AM  I provided 18 minutes of non-face-to-face time during this encounter.  Deliah Boston, MSN, APRN, FNP-C [facility] 09/21/19

## 2019-09-22 ENCOUNTER — Telehealth: Payer: Self-pay | Admitting: Family Medicine

## 2019-09-22 ENCOUNTER — Encounter: Payer: Self-pay | Admitting: Family Medicine

## 2019-09-22 ENCOUNTER — Ambulatory Visit (INDEPENDENT_AMBULATORY_CARE_PROVIDER_SITE_OTHER): Payer: Medicare Other | Admitting: Family Medicine

## 2019-09-22 DIAGNOSIS — B029 Zoster without complications: Secondary | ICD-10-CM | POA: Diagnosis not present

## 2019-09-22 NOTE — Progress Notes (Signed)
Virtual Visit via Telephone Note  I connected with Monica [name] on 09/22/19 at 2:31 PM by telephone and verified that I am speaking with the correct person using two identifiers. Monica [name] is currently located at home and nobody is currently with her during this visit. The provider, Loman Brooklyn, FNP is located in their office at time of visit.  I discussed the limitations, risks, security and privacy concerns of performing an evaluation and management service by telephone and the availability of in person appointments. I also discussed with the patient that there may be a patient responsible charge related to this service. The patient expressed understanding and agreed to proceed.  Subjective: PCP: Loman Brooklyn, FNP  Chief Complaint  Patient presents with  . Herpes Zoster   Patient was diagnosed with shingles yesterday and started on Valtrex.  She reports she received a call yesterday evening letting her know that she had been moved up and was eligible to get her COVID-19 vaccine on Friday, February 12th.  She just wanted to make sure that she was okay to get it given her current diagnosis and treatment.   ROS: Per HPI  Current Outpatient Medications:  .  diphenhydrAMINE (BENADRYL) 25 MG tablet, Take 25 mg by mouth every 6 (six) hours as needed for allergies., Disp: , Rfl:  .  metFORMIN (GLUCOPHAGE) 500 MG tablet, Take 500 mg once daily x1 week, add one tablet weekly until you are taking 1,000 mg twice daily. Take with meals. (Patient taking differently: Take 1,000 mg by mouth 2 (two) times daily. ), Disp: 360 tablet, Rfl: 0 .  methadone (DOLOPHINE) 10 MG tablet, Take 2 tablets (20 mg total) by mouth 3 (three) times daily., Disp: 180 tablet, Rfl: 0 .  naproxen (NAPROSYN) 500 MG tablet, Take 1 tablet (500 mg total) by mouth 2 (two) times daily with a meal., Disp: 60 tablet, Rfl: 6 .  pantoprazole (PROTONIX) 40 MG tablet, Take 1 tablet (40 mg total) by mouth daily. 30  minutes before breakfast, Disp: 30 tablet, Rfl: 3 .  promethazine (PHENERGAN) 25 MG tablet, TAKE 1 TABLET BY MOUTH EVERY 8 HOURS AS NEEDED FOR NAUSEA & VOMITING. (Patient taking differently: Take 25 mg by mouth every 8 (eight) hours as needed for nausea or vomiting. ), Disp: 60 tablet, Rfl: 3 .  tapentadol (NUCYNTA) 50 MG tablet, Take 1 tablet (50 mg total) by mouth as needed. (Patient taking differently: Take 50 mg by mouth daily as needed (migraines). ), Disp: 60 tablet, Rfl: 0 .  tiZANidine (ZANAFLEX) 2 MG tablet, TAKE (1) TABLET EVERY SIX HOURS AS NEEDED FOR MUSCLE SPASMS. (Patient taking differently: Take 2 mg by mouth every 6 (six) hours as needed for muscle spasms. ), Disp: 60 tablet, Rfl: 2 .  topiramate (TOPAMAX) 50 MG tablet, TAKE 2 TABLETS IN THE MORNING AND 4 TABLETS AT BEDTIME. (Patient taking differently: Take 100-200 mg by mouth See admin instructions. Take 100 mg by mouth in the morning and 200 mg in the evening), Disp: 180 tablet, Rfl: 2 .  valACYclovir (VALTREX) 1000 MG tablet, Take 1 tablet (1,000 mg total) by mouth every 8 (eight) hours for 7 days., Disp: 21 tablet, Rfl: 0  Allergies  Allergen Reactions  . Aspirin Anaphylaxis and Hives    REACTION: slurred speech, blurred vision  . Hydromorphone Hcl Shortness Of Breath    severe  . Vimpat [Lacosamide] Other (See Comments)    Hallucinations, out of body experience, confusion   .  Capsaicin     Red rash  . Depakote [Divalproex Sodium]   . Milnacipran Hcl Other (See Comments)    dizziness  . Pristiq [Desvenlafaxine Succinate Monohydrate] Other (See Comments)    dizziness   Past Medical History:  Diagnosis Date  . Arthritis   . Cervical facet joint syndrome 04/22/2012  . Cervicalgia   . Chronic headaches    Dr Hermelinda Medicus [place] [facility]ist  . Chronic nausea    normal GES   . Complication of anesthesia    had problem with local anesthesia-tried to assist Physician  . DM (diabetes mellitus) (HCC)   . Elevated liver  enzymes    hx of, normal 03/2010  . GERD (gastroesophageal reflux disease) 05/31/2011   History Schatzki's ring, erosive reflux esophagitis, status post last EGD and dilation 04/05/10   . Glaucoma    lens in both eyes  . Hemorrhoids 11/17/08   Colonoscopy Dr Karilyn Cota  . Migraine without aura, with intractable migraine, so stated, without mention of status migrainosus   . Occipital neuralgia   . PTSD (post-traumatic stress disorder)   . Renal lithiasis   . 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 Torticollis   . Unspecified musculoskeletal disorders and symptoms referable to neck    cervical/trapezius    Observations/Objective: A&O  No respiratory distress or wheezing audible over the phone Mood, judgement, and thought processes all WNL  Assessment and Plan: 1. Herpes zoster without complication - Per CDC guidelines and discussion with Dr. Orvan Falconer, infectious disease specialist, patient is okay to proceed with getting the COVID-19 vaccine at this time.  She was encouraged to wear a hat when she goes to keep the area covered to prevent from spreading it.   Follow Up Instructions:  I discussed the assessment and treatment plan with the patient. The patient was provided an opportunity to ask questions and all were answered. The patient agreed with the plan and demonstrated an understanding of the instructions.   The patient was advised to call back or seek an in-person evaluation if the symptoms worsen or if the condition fails to improve as anticipated.  The above assessment and management plan was discussed with the patient. The patient verbalized understanding of and has agreed to the management plan. Patient is aware to call the clinic if symptoms persist or worsen. Patient is aware when to return to the clinic for a follow-up visit. Patient educated on when it is appropriate to go to the emergency department.   Time call ended: 2:45 PM  I provided 17 minutes of non-face-to-face  time during this encounter.  Deliah Boston, MSN, APRN, FNP-C [facility] 09/22/19

## 2019-09-24 ENCOUNTER — Ambulatory Visit: Payer: Medicare Other | Attending: Internal Medicine

## 2019-09-24 DIAGNOSIS — Z23 Encounter for immunization: Secondary | ICD-10-CM

## 2019-09-24 NOTE — Progress Notes (Signed)
   Covid-19 Vaccination Clinic  Name:  Monica [name]    MRN: [id number] DOB: 11-Feb-1951  09/24/2019  [name] was observed post Covid-19 immunization for 15 minutes without incidence. She was provided with Vaccine Information Sheet and instruction to access the V-Safe system.   [name] was instructed to call 911 with any severe reactions post vaccine: Marland Kitchen Difficulty breathing  . Swelling of your face and throat  . A fast heartbeat  . A bad rash all over your body  . Dizziness and weakness    Immunizations Administered    Name Date Dose VIS Date Route   Pfizer COVID-19 Vaccine 09/24/2019 12:30 PM 0.3 mL 07/23/2019 Intramuscular   Manufacturer: [employer], [employer]   Lot: [id number]   NDC: [address]-1

## 2019-09-27 ENCOUNTER — Telehealth: Payer: Self-pay | Admitting: Family Medicine

## 2019-09-27 NOTE — Telephone Encounter (Signed)
Patient wanted to reschedule appointment.  New appointment scheduled for 09/04/2019 at 11:05 am with Shon Hale.

## 2019-09-29 ENCOUNTER — Ambulatory Visit: Payer: Medicare Other | Admitting: Family Medicine

## 2019-10-01 NOTE — Progress Notes (Signed)
Virtual Visit via Telephone Note  I connected with Monica [name] on 10/07/19 at 11:49 AM by telephone and verified that I am speaking with the correct person using two identifiers. Monica [name] is currently located at home and nobody is currently with her during this visit. The provider, Loman Brooklyn, FNP is located in their office at time of visit.  I discussed the limitations, risks, security and privacy concerns of performing an evaluation and management service by telephone and the availability of in person appointments. I also discussed with the patient that there may be a patient responsible charge related to this service. The patient expressed understanding and agreed to proceed.  Subjective: PCP: Loman Brooklyn, FNP  Chief Complaint  Patient presents with  . Medical Management of Chronic Issues   HPI: Monica [name] is a 69 y.o. female presenting on 10/05/2019 for medical management of chronic.  Diabetes: Patient presents for follow up of diabetes. Current symptoms include: none. Neuropathy has resolved. Medication compliance: yes. Current diet: in general, a "healthy" diet.  Home blood sugar records: BGs consistently in an acceptable range. Is she  on ACE inhibitor or angiotensin II receptor blocker? No. Is she on a statin? No.   Lab Results  Component Value Date   HGBA1C >14.0 (H) 06/29/2019   HGBA1C >14.0 (H) 06/29/2019   Lab Results  Component Value Date   LDLCALC 81 06/29/2019   CREATININE 0.97 06/29/2019    Since our last routine office visit:  Orthopedics on 07/20/2019 for left hip trochanteric bursitis where she received an injection.   GI on 08/03/2019: - Dysphagia in the setting of uncontrolled GERD. She was started on Protonix 40 mg once daily.  - Chronic nausea in the setting of chronic narcotics and diabetes; recommended diabetes management.  - Alternating bowel habits (intermittent diarrhea and constipation) in the setting of dysphagia and nausea.  Medications were held off on until she is back to normal intake.  - Needs colonoscopy in the future as her last was 2010 and her mother had colon cancer at age 73.  - Upper endoscopy scheduled with Dr. Gala Romney.   Orthopedics 08/11/2019 for right SI joint injection.   Pain Management (Dr. Naaman Plummer) on 08/18/2019: continue methadone, nucynta, naproxen, topamax, and zanaflex. 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 Recommended therapy to improve pelvic stability and ROM.   Upper Endoscopy on 08/30/2019: - Schatzki's ring, peptic stricture, reflex esophagitis. Dilated. - Small hiatal hernia. - Stopped protonix. Started AcipHex 20 mg QD.  Orthopedics on 09/08/2019: MRI ordered due to failed conservative treatment to rule out HNP/stenosis.   Then me on 09/21/2019 due to shingles.    New complaints: Patient reports that the shingles was on her head, around her ear and down her neck has been looking good.  She started having a new outbreak of the shingles 2 days ago on her left side under her breast.  She reports before the blistery rash appeared she could feel it inside her skin.  She describes it as feeling like something is crawling around in the ear.   ROS: Per HPI  Current Outpatient Medications:  .  atorvastatin (LIPITOR) 10 MG tablet, Take 1 tablet (10 mg total) by mouth daily., Disp: 30 tablet, Rfl: 2 .  blood glucose meter kit and supplies KIT, Dispense based on patient and insurance preference. Use up to four times daily as directed. (FOR ICD-9 250.00, 250.01)., Disp: 1 each, Rfl: 0 .  diphenhydrAMINE (  BENADRYL) 25 MG tablet, Take 25 mg by mouth every 6 (six) hours as needed for allergies., Disp: , Rfl:  .  metFORMIN (GLUCOPHAGE) 500 MG tablet, Take 2 tablets (1,000 mg total) by mouth 2 (two) times daily., Disp: 360 tablet, Rfl: 1 .  methadone (DOLOPHINE) 10 MG tablet, Take 2 tablets (20 mg total) by mouth 3 (three) times daily., Disp: 180 tablet, Rfl: 0 .  naproxen  (NAPROSYN) 500 MG tablet, Take 1 tablet (500 mg total) by mouth 2 (two) times daily with a meal., Disp: 60 tablet, Rfl: 6 .  pantoprazole (PROTONIX) 40 MG tablet, Take 1 tablet (40 mg total) by mouth daily. 30 minutes before breakfast, Disp: 30 tablet, Rfl: 3 .  promethazine (PHENERGAN) 25 MG tablet, TAKE 1 TABLET BY MOUTH EVERY 8 HOURS AS NEEDED FOR NAUSEA & VOMITING. (Patient taking differently: Take 25 mg by mouth every 8 (eight) hours as needed for nausea or vomiting. ), Disp: 60 tablet, Rfl: 3 .  tapentadol (NUCYNTA) 50 MG tablet, Take 1 tablet (50 mg total) by mouth as needed. (Patient taking differently: Take 50 mg by mouth daily as needed (migraines). ), Disp: 60 tablet, Rfl: 0 .  tiZANidine (ZANAFLEX) 2 MG tablet, TAKE (1) TABLET EVERY SIX HOURS AS NEEDED FOR MUSCLE SPASMS. (Patient taking differently: Take 2 mg by mouth every 6 (six) hours as needed for muscle spasms. ), Disp: 60 tablet, Rfl: 2 .  topiramate (TOPAMAX) 50 MG tablet, TAKE 2 TABLETS IN THE MORNING AND 4 TABLETS AT BEDTIME. (Patient taking differently: Take 100-200 mg by mouth See admin instructions. Take 100 mg by mouth in the morning and 200 mg in the evening), Disp: 180 tablet, Rfl: 2 .  valACYclovir (VALTREX) 1000 MG tablet, Take 1 tablet (1,000 mg total) by mouth every 8 (eight) hours for 7 days., Disp: 21 tablet, Rfl: 0  Allergies  Allergen Reactions  . Aspirin Anaphylaxis and Hives    REACTION: slurred speech, blurred vision  . Hydromorphone Hcl Shortness Of Breath    severe  . Vimpat [Lacosamide] Other (See Comments)    Hallucinations, out of body experience, confusion   . Capsaicin     Red rash  . Depakote [Divalproex Sodium]   . Milnacipran Hcl Other (See Comments)    dizziness  . Pristiq [Desvenlafaxine Succinate Monohydrate] Other (See Comments)    dizziness   Past Medical History:  Diagnosis Date  . Arthritis   . Cervical facet joint syndrome 04/22/2012  . Cervicalgia   . Chronic headaches    Dr  Tessa Lerner [place] [facility]  . Chronic nausea    normal GES   . Complication of anesthesia    had problem with local anesthesia-tried to assist Physician  . DM (diabetes mellitus) ([place])   . Elevated liver enzymes    hx of, normal 03/2010  . GERD (gastroesophageal reflux disease) 05/31/2011   History Schatzki's ring, erosive reflux esophagitis, status post last EGD and dilation 04/05/10   . Glaucoma    lens in both eyes  . Hemorrhoids 11/17/08   Colonoscopy Dr Laural Golden  . Migraine without aura, with intractable migraine, so stated, without mention of status migrainosus   . Occipital neuralgia   . PTSD (post-traumatic stress disorder)   . Renal lithiasis   . Seizure ([place])    12 yrs ago-med related  . Torticollis   . Unspecified musculoskeletal disorders and symptoms referable to neck    cervical/trapezius    Observations/Objective: A&O  No respiratory distress or wheezing  audible over the phone Mood, judgement, and thought processes all WNL  Assessment and Plan: 1. Type 2 diabetes mellitus with diabetic polyneuropathy, without long-term current use of insulin (HCC) Lab Results  Component Value Date   HGBA1C >14.0 (H) 06/29/2019   HGBA1C >14.0 (H) 06/29/2019  - Diabetic control unknown at this time as patient is having a telephone visit due to current outbreak of shingles.  I suspect her A1c is much improved given consistently good blood glucose readings per patient. - Medications: continue current medications - Patient is currently taking a statin. Patient is not taking an ACE-inhibitor/ARB.  - Last diabetic eye exam: 04/06/2019 - Urine Microalbumin/Creat Ratio: 06/29/2019 - CMP14+EGFR; Future - Bayer DCA Hb A1c Waived; Future - blood glucose meter kit and supplies KIT; Dispense based on patient and insurance preference. Use up to four times daily as directed. (FOR ICD-9 250.00, 250.01).  Dispense: 1 each; Refill: 0 - metFORMIN (GLUCOPHAGE) 500 MG tablet; Take 2 tablets (1,000 mg  total) by mouth 2 (two) times daily.  Dispense: 360 tablet; Refill: 1  2. Herpes zoster without complication - New prescription for Valtrex given now that patient is breaking out in a totally different area. - valACYclovir (VALTREX) 1000 MG tablet; Take 1 tablet (1,000 mg total) by mouth every 8 (eight) hours for 7 days.  Dispense: 21 tablet; Refill: 0  3. Elevated alkaline phosphatase level - CMP14+EGFR; Future - Gamma GT; Future - Nucleotidase, 5', blood; Future - Hepatitis panel, acute; Future  4. Elevation of levels of liver transaminase levels  - Hepatitis panel, acute; Future  5. Dysphagia, unspecified type - Resolved with esophageal dilation.   Follow Up Instructions: Return as directed after labs result.  I discussed the assessment and treatment plan with the patient. The patient was provided an opportunity to ask questions and all were answered. The patient agreed with the plan and demonstrated an understanding of the instructions.   The patient was advised to call back or seek an in-person evaluation if the symptoms worsen or if the condition fails to improve as anticipated.  The above assessment and management plan was discussed with the patient. The patient verbalized understanding of and has agreed to the management plan. Patient is aware to call the clinic if symptoms persist or worsen. Patient is aware when to return to the clinic for a follow-up visit. Patient educated on when it is appropriate to go to the emergency department.   Time call ended: 12:15 PM  I provided 30 minutes of non-face-to-face time during this encounter.  [name], MSN, APRN, FNP-C [facility] 10/07/19

## 2019-10-04 ENCOUNTER — Other Ambulatory Visit: Payer: Self-pay

## 2019-10-04 ENCOUNTER — Ambulatory Visit: Payer: Medicare Other

## 2019-10-05 ENCOUNTER — Ambulatory Visit (INDEPENDENT_AMBULATORY_CARE_PROVIDER_SITE_OTHER): Payer: Medicare Other | Admitting: Family Medicine

## 2019-10-05 DIAGNOSIS — R748 Abnormal levels of other serum enzymes: Secondary | ICD-10-CM

## 2019-10-05 DIAGNOSIS — E1142 Type 2 diabetes mellitus with diabetic polyneuropathy: Secondary | ICD-10-CM | POA: Diagnosis not present

## 2019-10-05 DIAGNOSIS — R7401 Elevation of levels of liver transaminase levels: Secondary | ICD-10-CM

## 2019-10-05 DIAGNOSIS — R1310 Dysphagia, unspecified: Secondary | ICD-10-CM

## 2019-10-05 DIAGNOSIS — B029 Zoster without complications: Secondary | ICD-10-CM | POA: Diagnosis not present

## 2019-10-05 MED ORDER — VALACYCLOVIR HCL 1 G PO TABS: 1000.0000 mg | ORAL_TABLET | Freq: Three times a day (TID) | ORAL | 0 refills | Status: AC

## 2019-10-05 MED ORDER — METFORMIN HCL 500 MG PO TABS: 1000.0000 mg | ORAL_TABLET | Freq: Two times a day (BID) | ORAL | 1 refills | Status: DC

## 2019-10-05 MED ORDER — BLOOD GLUCOSE MONITOR KIT: PACK | 0 refills | Status: DC

## 2019-10-05 MED ORDER — ATORVASTATIN CALCIUM 10 MG PO TABS: 10.0000 mg | ORAL_TABLET | Freq: Every day | ORAL | 2 refills | Status: DC

## 2019-10-07 ENCOUNTER — Encounter: Payer: Self-pay | Admitting: Family Medicine

## 2019-10-11 ENCOUNTER — Telehealth: Payer: Self-pay | Admitting: Orthopaedic Surgery

## 2019-10-11 NOTE — Telephone Encounter (Signed)
Pt called in stating she's having an MRI on 10-19-19 and is claustrophobic, she was wondering if anything could be called into her [facility] in [place] to calm her nerves?   [phone]

## 2019-10-12 ENCOUNTER — Other Ambulatory Visit: Payer: Medicare Other

## 2019-10-12 MED ORDER — DIAZEPAM 5 MG PO TABS: ORAL_TABLET | ORAL | 0 refills | Status: DC

## 2019-10-12 NOTE — Telephone Encounter (Signed)
Yes thanks 

## 2019-10-12 NOTE — Telephone Encounter (Signed)
Ok for valium? 

## 2019-10-12 NOTE — Telephone Encounter (Signed)
Called to pharmacy. I called patient, no answer, no voicemail set up. Will try again.

## 2019-10-12 NOTE — Telephone Encounter (Signed)
I called patient and gave instructions for valium prior to procedure. Patient aware meds have been called to her pharmacy.

## 2019-10-13 ENCOUNTER — Encounter: Payer: Self-pay | Admitting: Physical Medicine & Rehabilitation

## 2019-10-13 ENCOUNTER — Encounter
Payer: Worker's Compensation | Attending: Physical Medicine & Rehabilitation | Admitting: Physical Medicine & Rehabilitation

## 2019-10-13 ENCOUNTER — Other Ambulatory Visit: Payer: Self-pay

## 2019-10-13 VITALS — BP 123/76 | HR 88 | Temp 97.8°F | Ht 64.0 in | Wt 138.0 lb

## 2019-10-13 DIAGNOSIS — G43711 Chronic migraine without aura, intractable, with status migrainosus: Secondary | ICD-10-CM | POA: Diagnosis present

## 2019-10-13 DIAGNOSIS — M47812 Spondylosis without myelopathy or radiculopathy, cervical region: Secondary | ICD-10-CM | POA: Diagnosis not present

## 2019-10-13 DIAGNOSIS — R519 Headache, unspecified: Secondary | ICD-10-CM | POA: Diagnosis not present

## 2019-10-13 DIAGNOSIS — G4486 Cervicogenic headache: Secondary | ICD-10-CM

## 2019-10-13 MED ORDER — TAPENTADOL HCL 50 MG PO TABS: 50.0000 mg | ORAL_TABLET | Freq: Every day | ORAL | 0 refills | Status: DC | PRN

## 2019-10-13 MED ORDER — METHADONE HCL 10 MG PO TABS: 20.0000 mg | ORAL_TABLET | Freq: Three times a day (TID) | ORAL | 0 refills | Status: DC

## 2019-10-13 NOTE — Patient Instructions (Signed)
PLEASE FEEL FREE TO CALL OUR OFFICE WITH ANY PROBLEMS OR QUESTIONS ([phone])      

## 2019-10-13 NOTE — Progress Notes (Signed)
Subjective:    Patient ID: Monica [name], female    DOB: 07/05/1951, 69 y.o.   MRN: [id number]  HPI    Monica [name] is here in follow-up of her chronic pain.  From the standpoint of her cervical pain and migraine she has been doing fairly well.  Unfortunately she has had worsening low back and leg pain since I last saw her.  She has been followed by Dr. Ophelia Charter for this.  An MRI has been ordered and is scheduled for next week.  She complains of back and right leg pain with associated tingling and numbness going down to the dorsum of the right foot.  She has been struggling with her sleep as a result.  She is getting an hour or so at best each night.  As result she has been half asleep during the day.  In fact she was falling asleep as she was waiting for me to come into the exam room today.  For her headaches and cervical pain she remains on methadone and Nucynta as prescribed.  These have been effective for her.   Pain Inventory Average Pain 6 Pain Right Now 6 My pain is constant, sharp, burning, stabbing and aching  In the last 24 hours, has pain interfered with the following? General activity 0 Relation with others 1 Enjoyment of life 1 What TIME of day is your pain at its worst? evening Sleep (in general) Poor  Pain is worse with: unsure and some activites Pain improves with: rest and heat/ice Relief from Meds: 5  Mobility use a cane  Function Do you have any goals in this area?  no  Neuro/Psych numbness  Prior Studies Any changes since last visit?  no  Physicians involved in your care Any changes since last visit?  no   Family History  Problem Relation Age of Onset  . Colon cancer Mother 76  . Heart disease Father   . Diabetes Father   . Diabetes Paternal Grandmother   . Heart disease Paternal Grandfather   . Diverticulitis Daughter    Social History   Socioeconomic History  . Marital status: Married    Spouse name: Not on file  . Number of children: 3  .  Years of education: Not on file  . Highest education level: Not on file  Occupational History  . Occupation: disabled    Associate Professor: UNEMPLOYED  Tobacco Use  . Smoking status: Never Smoker  . Smokeless tobacco: Never Used  Substance and Sexual Activity  . Alcohol use: No  . Drug use: No  . Sexual activity: Yes    Birth control/protection: Post-menopausal  Other Topics Concern  . Not on file  Social History Narrative  . Not on file   Social Determinants of Health   Financial Resource Strain:   . Difficulty of Paying Living Expenses: Not on file  Food Insecurity:   . Worried About Programme researcher, broadcasting/film/video in the Last Year: Not on file  . Ran Out of Food in the Last Year: Not on file  Transportation Needs:   . Lack of Transportation (Medical): Not on file  . Lack of Transportation (Non-Medical): Not on file  Physical Activity:   . Days of Exercise per Week: Not on file  . Minutes of Exercise per Session: Not on file  Stress:   . Feeling of Stress : Not on file  Social Connections:   . Frequency of Communication with Friends and Family: Not on file  . Frequency  of Social Gatherings with Friends and Family: Not on file  . Attends Religious Services: Not on file  . Active Member of Clubs or Organizations: Not on file  . Attends Archivist Meetings: Not on file  . Marital Status: Not on file   Past Surgical History:  Procedure Laterality Date  . BREAST LUMPECTOMY     left  . CATARACT EXTRACTION W/PHACO Left 10/07/2016   Procedure: CATARACT EXTRACTION PHACO AND INTRAOCULAR LENS PLACEMENT (IOC);  Surgeon: Tonny Branch, MD;  Location: [facility];  Service: Ophthalmology;  Laterality: Left;  CDE: 4.98  . CATARACT EXTRACTION W/PHACO Right 10/21/2016   Procedure: CATARACT EXTRACTION PHACO AND INTRAOCULAR LENS PLACEMENT RIGHT EYE CDE - 6.24;  Surgeon: Tonny Branch, MD;  Location: [facility];  Service: Ophthalmology;  Laterality: Right;  right  . CHOLECYSTECTOMY    . COLONOSCOPY  11/17/08    ext hemorrhoids  . CRANIOTOMY     secondary TBI  . CYST EXCISION     left wrist  . ESOPHAGOGASTRODUODENOSCOPY  04/05/10   [name] (63F),erosive reflux esophagitis, small hiatal hernia,  . ESOPHAGOGASTRODUODENOSCOPY (EGD) WITH PROPOFOL N/A 08/30/2019   Procedure: ESOPHAGOGASTRODUODENOSCOPY (EGD) WITH PROPOFOL;  Surgeon: Daneil Dolin, MD;  Location: [facility];  Service: Endoscopy;  Laterality: N/A;  3:30pm - pt knows to arrive at 9:45  . FINGER SURGERY     removal cyst left pinky  . gastro empy study  03/03/10   normal  . HAND SURGERY    . MALONEY DILATION N/A 08/30/2019   Procedure: Venia Minks DILATION;  Surgeon: Daneil Dolin, MD;  Location: [facility];  Service: Endoscopy;  Laterality: N/A;  . NECK SURGERY  1998  . NEPHROLITHOTOMY  05/13/2012   Procedure: NEPHROLITHOTOMY PERCUTANEOUS;  Surgeon: Franchot Gallo, MD;  Location: [facility];  Service: Urology;  Laterality: Left;      Past Medical History:  Diagnosis Date  . Arthritis   . Cervical facet joint syndrome 04/22/2012  . Cervicalgia   . Chronic headaches    Dr Tessa Lerner [place] [facility]  . Chronic nausea    normal GES   . Complication of anesthesia    had problem with local anesthesia-tried to assist Physician  . DM (diabetes mellitus) ([place])   . Elevated liver enzymes    hx of, normal 03/2010  . GERD (gastroesophageal reflux disease) 05/31/2011   History Schatzki's ring, erosive reflux esophagitis, status post last EGD and dilation 04/05/10   . Glaucoma    lens in both eyes  . Hemorrhoids 11/17/08   Colonoscopy Dr Laural Golden  . Migraine without aura, with intractable migraine, so stated, without mention of status migrainosus   . Occipital neuralgia   . PTSD (post-traumatic stress disorder)   . Renal lithiasis   . Seizure ([place])    12 yrs ago-med related  . Torticollis   . Unspecified musculoskeletal disorders and symptoms referable to neck    cervical/trapezius   BP 123/76   Pulse 88   Temp 97.8 F  (36.6 C)   Ht 5\' 4"  (1.626 m)   Wt 138 lb (62.6 kg)   SpO2 97%   BMI 23.69 kg/m   Opioid Risk Score:   Fall Risk Score:  `1  Depression screen PHQ 2/9  Depression screen [facility] 2/9 07/16/2019 06/29/2019 12/16/2018 10/19/2018 04/22/2018 10/15/2016 04/10/2016  Decreased Interest 0 1 1 1  0 3 3  Down, Depressed, Hopeless 0 1 1 1  0 3 3  PHQ - 2 Score 0 2 2  2 0 6 6  Altered sleeping - 3 - - - - -  Tired, decreased energy - 2 - - - - -  Change in appetite - 3 - - - - -  Feeling bad or failure about yourself  - 1 - - - - -  Trouble concentrating - 3 - - - - -  Moving slowly or fidgety/restless - 2 - - - - -  Suicidal thoughts - 0 - - - - -  PHQ-9 Score - 16 - - - - -  Difficult doing work/chores - Not difficult at all - - - - -  Some recent data might be hidden    Review of Systems  Constitutional: Negative.   HENT: Negative.   Eyes: Negative.   Respiratory: Negative.   Cardiovascular: Negative.   Gastrointestinal: Negative.   Endocrine: Negative.   Genitourinary: Negative.   Musculoskeletal: Positive for gait problem.  Skin: Negative.   Allergic/Immunologic: Negative.   Neurological: Positive for headaches.  Hematological: Negative.   Psychiatric/Behavioral: Negative.   All other systems reviewed and are negative.      Objective:   Physical Exam  General: No acute distress HEENT: EOMI, oral membranes moist Cards: reg rate  Chest: normal effort Abdomen: Soft, NT, ND Skin: dry, intact Extremities: no edema    Neuro:having a hard staying awake. Literally nodding off during our visit. Decreased sensation along right leg down to dorsum of foot. DTR's absent RLE 1+ LLE, has difficulty activating any muscles in RLE. LLE freely moves.  Musc:RLE tender with ROM. LB and PSIS tender to palpation as well as flex/ext Psych:fatigeud but pleasant  Assessment & Plan:   Assessment:  1. History of cervicalgia, facet arthropathy (left C3-4 appears most severe), and chronic daily,  intractable migraine headaches.  2. Posttraumatic stress syndrome.  3. Greater occipital neuralgia (bilateral) confirmed by response to recent nerve block  4. 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 Continue methadone rx.RF today. #180 10mg  tablets We will continue the controlled substance monitoring program, this consists of regular clinic visits, examinations, routine drug screening, pill counts as well as use of Controlled Substance Reporting System. [place] today.  -Continuemethadone at the 20 mg 3 times dailyschedule. #180 10mg  tabs - considerconversion to Nucynta controlled release - Nucynta 50 mg every 12 hours as needed #60RF today -naproxen for breakthroughpain -Medication was refilled and a second prescription was sent to the patient's pharmacy for next month.   2.History of recentgreater occipital nerve RF with transient effects.  3. Continue topamax.   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 5.MRI pending for right low back pain and likely radiculopathy 6.Continuezanaflex 2mg  q6 prn for spasm.generally uses in evenings. Continue with current schedule.  Fifteen minutes of face to face patient care time were spent during this visit. All questions were encouraged and answered.  Follow up with me in 2 mos .

## 2019-10-14 ENCOUNTER — Ambulatory Visit
Admission: RE | Admit: 2019-10-14 | Discharge: 2019-10-14 | Disposition: A | Discharge status: Home / Self Care | Payer: Medicare Other | Source: Ambulatory Visit | Attending: Surgery | Admitting: Surgery

## 2019-10-14 DIAGNOSIS — M5416 Radiculopathy, lumbar region: Secondary | ICD-10-CM

## 2019-10-16 ENCOUNTER — Ambulatory Visit: Payer: Medicare Other | Attending: Internal Medicine

## 2019-10-16 DIAGNOSIS — Z23 Encounter for immunization: Secondary | ICD-10-CM | POA: Insufficient documentation

## 2019-10-16 NOTE — Progress Notes (Signed)
   Covid-19 Vaccination Clinic  Name:  Monica [name]    MRN: [id number] DOB: [birth year]  10/16/2019  Monica [name] was observed post Covid-19 immunization for 15 minutes without incident. She was provided with Vaccine Information Sheet and instruction to access the V-Safe system.   Monica [name] was instructed to call 911 with any severe reactions post vaccine: Marland Kitchen Difficulty breathing  . Swelling of face and throat  . A fast heartbeat  . A bad rash all over body  . Dizziness and weakness   Immunizations Administered    Name Date Dose VIS Date Route   Pfizer COVID-19 Vaccine 10/16/2019  3:28 PM 0.3 mL 07/23/2019 Intramuscular   Manufacturer: [employer], [employer]   Lot: [id number]   NDC: [address]-1

## 2019-10-19 ENCOUNTER — Encounter: Payer: Self-pay | Admitting: Orthopaedic Surgery

## 2019-10-19 ENCOUNTER — Ambulatory Visit (INDEPENDENT_AMBULATORY_CARE_PROVIDER_SITE_OTHER): Payer: Medicare Other | Admitting: Orthopaedic Surgery

## 2019-10-19 ENCOUNTER — Other Ambulatory Visit: Payer: Self-pay

## 2019-10-19 ENCOUNTER — Other Ambulatory Visit: Payer: Self-pay | Admitting: Physical Medicine & Rehabilitation

## 2019-10-19 VITALS — BP 123/74 | HR 75 | Ht 64.0 in | Wt 136.0 lb

## 2019-10-19 DIAGNOSIS — Z5181 Encounter for therapeutic drug level monitoring: Secondary | ICD-10-CM

## 2019-10-19 DIAGNOSIS — M6281 Muscle weakness (generalized): Secondary | ICD-10-CM

## 2019-10-19 DIAGNOSIS — M7918 Myalgia, other site: Secondary | ICD-10-CM

## 2019-10-19 DIAGNOSIS — G894 Chronic pain syndrome: Secondary | ICD-10-CM

## 2019-10-19 DIAGNOSIS — Z79899 Other long term (current) drug therapy: Secondary | ICD-10-CM

## 2019-10-19 DIAGNOSIS — Z79891 Long term (current) use of opiate analgesic: Secondary | ICD-10-CM

## 2019-10-19 DIAGNOSIS — G43711 Chronic migraine without aura, intractable, with status migrainosus: Secondary | ICD-10-CM

## 2019-10-19 DIAGNOSIS — R29898 Other symptoms and signs involving the musculoskeletal system: Secondary | ICD-10-CM | POA: Insufficient documentation

## 2019-10-19 DIAGNOSIS — M47812 Spondylosis without myelopathy or radiculopathy, cervical region: Secondary | ICD-10-CM

## 2019-10-19 DIAGNOSIS — G4486 Cervicogenic headache: Secondary | ICD-10-CM

## 2019-10-19 DIAGNOSIS — G43011 Migraine without aura, intractable, with status migrainosus: Secondary | ICD-10-CM

## 2019-10-19 NOTE — Progress Notes (Signed)
Office Visit Note   Patient: Monica [name]           Date of Birth: 1951-05-17           MRN: [id number] Visit Date: 10/19/2019              Requested by: [name], FNP [address] [place],  [place] [address] PCP: [name], FNP   Assessment & Plan: Visit Diagnoses:  1. Quadriceps weakness            Right leg  Plan: This does not look like a diabetic polyneuropathy with unilateral quad weakness.  She does not have atrophy and has normal adductor strength which is same level innervation.  MRI scan is reviewed without corresponding compression.  Prescription given for walker due to her falling history.  She needs physical therapy for quad strengthening.  I will recheck her in a month.  Will consider neurology referral if she is not responding to therapy.  Follow-Up Instructions: No follow-ups on file.   Orders:  Orders Placed This Encounter  Procedures  . Ambulatory referral to Physical Therapy   No orders of the defined types were placed in this encounter.     Procedures: No procedures performed   Clinical Data: No additional findings.   Subjective: Chief Complaint  Patient presents with  . Lower Back - Pain, Follow-up    Lumbar MRI Review    HPI 69 year old female returns with history of falls and right leg weakness.  She ambulates with a cane she states she is fallen 4 times and states her leg completely gives out.  She is onNucynta 50 mg tablet and also methadone 10 mg 180 a month.  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  Patient is using a cane to ambulate.  She denies significant weakness in her left leg.  No problems with right or left arm.  She states the weakness in her right leg has been present for about 4 months.  Review of Systems 14 point review of systems unchanged from 07/20/2019.  Of note is chronic neck pain, depression, diabetes, falling history, chronic narcotic pain management.   Objective: Vital Signs: BP  123/74   Pulse 75   Ht 5\' 4"  (1.626 m)   Wt 136 lb (61.7 kg)   BMI 23.34 kg/m   Physical Exam Constitutional:      Appearance: She is well-developed.  HENT:     Head: Normocephalic.     Right Ear: External ear normal.     Left Ear: External ear normal.  Eyes:     Pupils: Pupils are equal, round, and reactive to light.  Neck:     Thyroid: No thyromegaly.     Trachea: No tracheal deviation.  Cardiovascular:     Rate and Rhythm: Normal rate.  Pulmonary:     Effort: Pulmonary effort is normal.  Abdominal:     Palpations: Abdomen is soft.  Skin:    General: Skin is warm and dry.  Neurological:     Mental Status: She is alert and oriented to person, place, and time.  Psychiatric:        Behavior: Behavior normal.     Ortho Exam patient has significant quad weakness on the right leg when she walks she tends to hyperextend when she walks with a cane.  She uses a cane in the left hand.  She does not have significant quad atrophy but has some decreased sensation anteriorly over  the thigh.  Abductor surprisingly is normal testing right and left no hamstring weakness.  She also has hip flexion weakness on the right but not on the left.  Anterior tib gastrocsoleus is strong knee and ankle jerk are only trace but symmetrical as well as upper extremities.  I can overcome her right quad with 2 finger pressure mid tibia.  This is present only on the right side she also has significant hip flexion weakness. Specialty Comments:  No specialty comments available.  Imaging: No results found.   PMFS History: Patient Active Problem List   Diagnosis Date Noted  . Quadriceps weakness 10/19/2019  . Arthritis of right sacroiliac joint 08/18/2019  . Altered bowel habits 08/03/2019  . Trochanteric bursitis, left hip 07/20/2019  . Diabetes mellitus ([place]) 06/17/2019  . PTSD (post-traumatic stress disorder) 10/15/2016  . Pain management 10/06/2015  . Encounter for therapeutic drug level monitoring  10/02/2015  . Occipital neuralgia (Location of Primary Source of Pain) (Bilateral) (L>R) 10/02/2015  . Chronic cervical radicular pain (Left) 08/31/2015  . 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 Cervical facet arthropathy (severe at C3-4) (Bilateral) (L>R) 07/26/2015  . Cervical foraminal stenosis (C3-4) (Left) 07/26/2015  . Cervical spondylosis 06/13/2015  . Cervical facet syndrome (Location of Secondary source of pain) (Bilateral) (L>R) 06/13/2015  . Chronic pain 06/13/2015  . Cervicogenic headache (Location of Primary Source of Pain) (Bilateral) (L>R) 06/13/2015  . Chronic neck pain (Location of Secondary source of pain) (Bilateral) (L>R) 06/13/2015  . Long term current use of opiate analgesic 06/13/2015  . Opiate use (600 MME/Day) 06/13/2015  . Opiate dependence ([place]) 06/13/2015  . Myofascial muscle pain 04/19/2013  . Dyspnea 02/10/2013  . Intractable chronic migraine without aura and with status migrainosus 04/22/2012  . Depression 04/22/2012  . Borderline personality disorder ([place]) 04/22/2012  . GERD (gastroesophageal reflux disease) 05/31/2011   Past Medical History:  Diagnosis Date  . Arthritis   . Cervical facet joint syndrome 04/22/2012  . Cervicalgia   . Chronic headaches    Dr Tessa Lerner [facility]  . Chronic nausea    normal GES   . Complication of anesthesia    had problem with local anesthesia-tried to assist Physician  . DM (diabetes mellitus) ([place])   . Elevated liver enzymes    hx of, normal 03/2010  . GERD (gastroesophageal reflux disease) 05/31/2011   History Schatzki's ring, erosive reflux esophagitis, status post last EGD and dilation 04/05/10   . Glaucoma    lens in both eyes  . Hemorrhoids 11/17/08   Colonoscopy Dr Laural Golden  . Migraine without aura, with intractable migraine, so stated, without mention of status migrainosus   . Occipital neuralgia   . PTSD (post-traumatic stress disorder)   . Renal lithiasis   . Seizure  ([place])    12 yrs ago-med related  . Torticollis   . Unspecified musculoskeletal disorders and symptoms referable to neck    cervical/trapezius    Family History  Problem Relation Age of Onset  . Colon cancer Mother 33  . Heart disease Father   . Diabetes Father   . Diabetes Paternal Grandmother   . Heart disease Paternal Grandfather   . Diverticulitis Daughter     Past Surgical History:  Procedure Laterality Date  . BREAST LUMPECTOMY     left  . CATARACT EXTRACTION W/PHACO Left 10/07/2016   Procedure: CATARACT EXTRACTION PHACO AND INTRAOCULAR LENS PLACEMENT (IOC);  Surgeon: Tonny Branch, MD;  Location: [facility];  Service:  Ophthalmology;  Laterality: Left;  CDE: 4.98  . CATARACT EXTRACTION W/PHACO Right 10/21/2016   Procedure: CATARACT EXTRACTION PHACO AND INTRAOCULAR LENS PLACEMENT RIGHT EYE CDE - 6.24;  Surgeon: Gemma Payor, MD;  Location: [facility];  Service: Ophthalmology;  Laterality: Right;  right  . CHOLECYSTECTOMY    . COLONOSCOPY  11/17/08   ext hemorrhoids  . CRANIOTOMY     secondary TBI  . CYST EXCISION     left wrist  . ESOPHAGOGASTRODUODENOSCOPY  04/05/10   [name] (7F),erosive reflux esophagitis, small hiatal hernia,  . ESOPHAGOGASTRODUODENOSCOPY (EGD) WITH PROPOFOL N/A 08/30/2019   Procedure: ESOPHAGOGASTRODUODENOSCOPY (EGD) WITH PROPOFOL;  Surgeon: Corbin Ade, MD;  Location: [facility];  Service: Endoscopy;  Laterality: N/A;  3:30pm - pt knows to arrive at 9:45  . FINGER SURGERY     removal cyst left pinky  . gastro empy study  03/03/10   normal  . HAND SURGERY    . MALONEY DILATION N/A 08/30/2019   Procedure: Elease Hashimoto DILATION;  Surgeon: Corbin Ade, MD;  Location: [facility];  Service: Endoscopy;  Laterality: N/A;  . NECK SURGERY  1998  . NEPHROLITHOTOMY  05/13/2012   Procedure: NEPHROLITHOTOMY PERCUTANEOUS;  Surgeon: Marcine Matar, MD;  Location: [facility];  Service: Urology;  Laterality: Left;      Social History   Occupational History  .  Occupation: disabled    Associate Professor: UNEMPLOYED  Tobacco Use  . Smoking status: Never Smoker  . Smokeless tobacco: Never Used  Substance and Sexual Activity  . Alcohol use: No  . Drug use: No  . Sexual activity: Yes    Birth control/protection: Post-menopausal

## 2019-10-22 ENCOUNTER — Ambulatory Visit (INDEPENDENT_AMBULATORY_CARE_PROVIDER_SITE_OTHER): Payer: Medicare Other

## 2019-10-22 ENCOUNTER — Other Ambulatory Visit: Payer: Self-pay

## 2019-10-22 ENCOUNTER — Ambulatory Visit (INDEPENDENT_AMBULATORY_CARE_PROVIDER_SITE_OTHER): Payer: Medicare Other | Admitting: Family Medicine

## 2019-10-22 ENCOUNTER — Encounter: Payer: Self-pay | Admitting: Family Medicine

## 2019-10-22 VITALS — BP 118/67 | HR 63 | Temp 96.8°F | Ht 64.0 in | Wt 138.0 lb

## 2019-10-22 DIAGNOSIS — S20212A Contusion of left front wall of thorax, initial encounter: Secondary | ICD-10-CM | POA: Diagnosis not present

## 2019-10-22 DIAGNOSIS — R0789 Other chest pain: Secondary | ICD-10-CM | POA: Diagnosis not present

## 2019-10-22 DIAGNOSIS — R748 Abnormal levels of other serum enzymes: Secondary | ICD-10-CM | POA: Diagnosis not present

## 2019-10-22 DIAGNOSIS — W19XXXA Unspecified fall, initial encounter: Secondary | ICD-10-CM | POA: Diagnosis not present

## 2019-10-22 DIAGNOSIS — E1142 Type 2 diabetes mellitus with diabetic polyneuropathy: Secondary | ICD-10-CM

## 2019-10-22 DIAGNOSIS — R7401 Elevation of levels of liver transaminase levels: Secondary | ICD-10-CM

## 2019-10-22 LAB — BAYER DCA HB A1C WAIVED: HB A1C (BAYER DCA - WAIVED): 9.9 % — ABNORMAL HIGH (ref <7.0)

## 2019-10-22 NOTE — Patient Instructions (Signed)
Ibuprofen 400-600 mg WITH Tylenol 500 mg every 6-8 hours as needed for pain.   Rib Contusion A rib contusion is a deep bruise on your rib area. Contusions are the result of a blunt trauma that causes bleeding and injury to the tissues under the skin. A rib contusion may involve bruising of the ribs and of the skin and muscles in the area. The skin over the contusion may turn blue, purple, or yellow. Minor injuries will give you a painless contusion. More severe contusions may stay painful and swollen for a few weeks. What are the causes? This condition is usually caused by a blow, trauma, or direct force to an area of the body. This often occurs while playing contact sports. What are the signs or symptoms? Symptoms of this condition include:  Swelling and redness of the injured area.  Discoloration of the injured area.  Tenderness and soreness of the injured area.  Pain with or without movement. How is this diagnosed? This condition may be diagnosed based on:  Your symptoms and medical history.  A physical exam.  Imaging tests--such as an X-ray, CT scan, or MRI--to determine if there were internal injuries or broken bones (fractures). How is this treated? This condition may be treated with:  Rest. This is often the best treatment for a rib contusion.  Icing. This reduces swelling and inflammation.  Deep-breathing exercises. These may be recommended to reduce the risk for lung collapse and pneumonia.  Medicines. Over-the-counter or prescription medicines may be given to control pain.  Injection of a numbing medicine around the nerve near your injury (nerve block). Follow these instructions at home:     Medicines  Take over-the-counter and prescription medicines only as told by your health care provider.  Do not drive or use heavy machinery while taking prescription pain medicine.  If you are taking prescription pain medicine, take actions to prevent or treat  constipation. Your health care provider may recommend that you: ? Drink enough fluid to keep your urine pale yellow. ? Eat foods that are high in fiber, such as fresh fruits and vegetables, whole grains, and beans. ? Limit foods that are high in fat and processed sugars, such as fried or sweet foods. ? Take an over-the-counter or prescription medicine for constipation. Managing pain, stiffness, and swelling  If directed, put ice on the injured area: ? Put ice in a plastic bag. ? Place a towel between your skin and the bag. ? Leave the ice on for 20 minutes, 2-3 times a day.  Rest the injured area. Avoid strenuous activity and any activities or movements that cause pain. Be careful during activities and avoid bumping the injured area.  Do not lift anything that is heavier than 5 lb (2.3 kg), or the limit that you are told, until your health care provider says that it is safe. General instructions  Do not use any products that contain nicotine or tobacco, such as cigarettes and e-cigarettes. These can delay healing. If you need help quitting, ask your health care provider.  Do deep-breathing exercises as told by your health care provider.  If you were given an incentive spirometer, use it every 1-2 hours while you are awake, or as recommended by your health care provider. This device measures how well you are filling your lungs with each breath.  Keep all follow-up visits as told by your health care provider. This is important. Contact a health care provider if you have:  Increased bruising or swelling.  Pain that is not controlled with treatment.  A fever. Get help right away if you:  Have difficulty breathing or shortness of breath.  Develop a continual cough or you cough up thick or bloody sputum.  Feel nauseous or you vomit.  Have pain in your abdomen. Summary  A rib contusion is a deep bruise on your rib area. Contusions are the result of a blunt trauma that causes  bleeding and injury to the tissues under the skin.  The skin overlying the contusion may turn blue, purple, or yellow. Minor injuries may give you a painless contusion. More severe contusions may stay painful and swollen for a few weeks.  Rest the injured area. Avoid strenuous activity and any activities or movements that cause pain. This information is not intended to replace advice given to you by your health care provider. Make sure you discuss any questions you have with your health care provider. Document Revised: 08/27/2017 Document Reviewed: 08/27/2017 Elsevier Patient Education  2020 [employer].

## 2019-10-22 NOTE — Progress Notes (Signed)
Assessment & Plan:  1-2. Contusion of rib on left side, initial encounter/Fall, initial encounter - Education provided on rib contusions. Ibuprofen 400-600 mg WITH Tylenol 500 mg every 6-8 hours as needed for pain. - DG Ribs Unilateral W/Chest Right; Future (right was ordered but left was viewed per patients c/o pain): negative   Follow up plan: Return if symptoms worsen or fail to improve.  [name], MSN, APRN, FNP-C [facility]  Subjective:   Patient ID: Monica [name], female    DOB: [birth year], 69 y.o.   MRN: [id number]  HPI: Monica [name] is a 69 y.o. female presenting on 10/22/2019 for Chest Pain (Patient is c/o left sided rib pain that she has had for a week and half after a fall.  Patient states the pain is getting worse)  Patient complains of left-sided rib pain that has been going on for the past week and a half since her last fall.  She has had a total of 5 falls within the past 5 weeks.  She has seen her orthopedic who has recommended she resume physical therapy.  She does report it hurts to take a deep breath and she does occasionally have some shortness of breath.   ROS: Negative unless specifically indicated above in HPI.   Relevant past medical history reviewed and updated as indicated.   Allergies and medications reviewed and updated.   Current Outpatient Medications:  .  atorvastatin (LIPITOR) 10 MG tablet, Take 1 tablet (10 mg total) by mouth daily., Disp: 30 tablet, Rfl: 2 .  blood glucose meter kit and supplies KIT, Dispense based on patient and insurance preference. Use up to four times daily as directed. (FOR ICD-9 250.00, 250.01)., Disp: 1 each, Rfl: 0 .  diphenhydrAMINE (BENADRYL) 25 MG tablet, Take 25 mg by mouth every 6 (six) hours as needed for allergies., Disp: , Rfl:  .  metFORMIN (GLUCOPHAGE) 500 MG tablet, Take 2 tablets (1,000 mg total) by mouth 2 (two) times daily., Disp: 360 tablet, Rfl: 1 .  methadone (DOLOPHINE) 10  MG tablet, Take 2 tablets (20 mg total) by mouth 3 (three) times daily., Disp: 180 tablet, Rfl: 0 .  Misc Natural Products (OSTEO BI-FLEX ADV TRIPLE ST PO), Take by mouth., Disp: , Rfl:  .  Multiple Vitamins-Minerals (MULTI-VITAMIN GUMMIES PO), Take by mouth., Disp: , Rfl:  .  naproxen (NAPROSYN) 500 MG tablet, Take 1 tablet (500 mg total) by mouth 2 (two) times daily with a meal., Disp: 60 tablet, Rfl: 6 .  pantoprazole (PROTONIX) 40 MG tablet, Take 1 tablet (40 mg total) by mouth daily. 30 minutes before breakfast, Disp: 30 tablet, Rfl: 3 .  promethazine (PHENERGAN) 25 MG tablet, TAKE 1 TABLET BY MOUTH EVERY 8 HOURS AS NEEDED FOR NAUSEA & VOMITING. (Patient taking differently: Take 25 mg by mouth every 8 (eight) hours as needed for nausea or vomiting. ), Disp: 60 tablet, Rfl: 3 .  tapentadol (NUCYNTA) 50 MG tablet, Take 1 tablet (50 mg total) by mouth daily as needed (migraines)., Disp: 60 tablet, Rfl: 0 .  tiZANidine (ZANAFLEX) 2 MG tablet, TAKE (1) TABLET EVERY SIX HOURS AS NEEDED FOR MUSCLE SPASMS. (Patient taking differently: Take 2 mg by mouth every 6 (six) hours as needed for muscle spasms. ), Disp: 60 tablet, Rfl: 2 .  topiramate (TOPAMAX) 50 MG tablet, TAKE 2 TABLETS IN THE MORNING AND 4 TABLETS AT BEDTIME., Disp: 180 tablet, Rfl: 0 .  vitamin B-12 (CYANOCOBALAMIN) 1000 MCG tablet, Take  1,000 mcg by mouth daily., Disp: , Rfl:   Allergies  Allergen Reactions  . Aspirin Anaphylaxis and Hives    REACTION: slurred speech, blurred vision  . Hydromorphone Hcl Shortness Of Breath    severe  . Vimpat [Lacosamide] Other (See Comments)    Hallucinations, out of body experience, confusion   . Capsaicin     Red rash  . Depakote [Divalproex Sodium]   . Milnacipran Hcl Other (See Comments)    dizziness  . Pristiq [Desvenlafaxine Succinate Monohydrate] Other (See Comments)    dizziness    Objective:   BP 118/67   Pulse 63   Temp (!) 96.8 F (36 C) (Temporal)   Ht _0  (1.626 m)   Wt  138 lb (62.6 kg)   SpO2 99%   BMI 23.69 kg/m    Physical Exam Vitals reviewed.  Constitutional:      General: She is not in acute distress.    Appearance: Normal appearance. She is normal weight. She is not ill-appearing, toxic-appearing or diaphoretic.  HENT:     Head: Normocephalic and atraumatic.  Eyes:     General: No scleral icterus.       Right eye: No discharge.        Left eye: No discharge.     Conjunctiva/sclera: Conjunctivae normal.  Cardiovascular:     Rate and Rhythm: Normal rate.  Pulmonary:     Effort: Pulmonary effort is normal. No respiratory distress.  Chest:     Chest wall: Swelling (ribs on left side under breasts) and tenderness (ribs on left side under breasts) present. No mass, lacerations, deformity or crepitus.  Musculoskeletal:        General: Normal range of motion.     Cervical back: Normal range of motion.  Skin:    General: Skin is warm and dry.     Capillary Refill: Capillary refill takes less than 2 seconds.  Neurological:     General: No focal deficit present.     Mental Status: She is alert and oriented to person, place, and time. Mental status is at baseline.  Psychiatric:        Mood and Affect: Mood normal.        Behavior: Behavior normal.        Thought Content: Thought content normal.        Judgment: Judgment normal.

## 2019-10-25 ENCOUNTER — Encounter: Payer: Self-pay | Admitting: Family Medicine

## 2019-10-25 ENCOUNTER — Other Ambulatory Visit: Payer: Self-pay | Admitting: Family Medicine

## 2019-10-25 DIAGNOSIS — E1142 Type 2 diabetes mellitus with diabetic polyneuropathy: Secondary | ICD-10-CM

## 2019-10-25 LAB — CMP14+EGFR
ALT: 14 IU/L (ref 0–32)
AST: 12 IU/L (ref 0–40)
Albumin/Globulin Ratio: 1.7 (ref 1.2–2.2)
Albumin: 4.3 g/dL (ref 3.8–4.8)
Alkaline Phosphatase: 143 IU/L — ABNORMAL HIGH (ref 39–117)
BUN/Creatinine Ratio: 20 (ref 12–28)
BUN: 21 mg/dL (ref 8–27)
Bilirubin Total: <0.2 mg/dL (ref 0.0–1.2)
CO2: 23 mmol/L (ref 20–29)
Calcium: 9.5 mg/dL (ref 8.7–10.3)
Chloride: 97 mmol/L (ref 96–106)
Creatinine, Ser: 1.05 mg/dL — ABNORMAL HIGH (ref 0.57–1.00)
GFR calc Af Amer: 64 mL/min/{1.73_m2} (ref >59)
GFR calc non Af Amer: 55 mL/min/{1.73_m2} — ABNORMAL LOW (ref >59)
Globulin, Total: 2.6 g/dL (ref 1.5–4.5)
Glucose: 305 mg/dL — ABNORMAL HIGH (ref 65–99)
Potassium: 4.9 mmol/L (ref 3.5–5.2)
Sodium: 136 mmol/L (ref 134–144)
Total Protein: 6.9 g/dL (ref 6.0–8.5)

## 2019-10-25 LAB — HEPATITIS PANEL, ACUTE
Hep A IgM: NEGATIVE
Hep B C IgM: NEGATIVE
Hep C Virus Ab: <0.1 s/co ratio (ref 0.0–0.9)
Hepatitis B Surface Ag: NEGATIVE

## 2019-10-25 LAB — GAMMA GT: GGT: 26 IU/L (ref 0–60)

## 2019-10-25 LAB — NUCLEOTIDASE, 5', BLOOD: 5-Nucleotidase: 5 IU/L (ref 0–10)

## 2019-10-25 MED ORDER — GLIPIZIDE 5 MG PO TABS: 5.0000 mg | ORAL_TABLET | Freq: Every day | ORAL | 3 refills | Status: DC

## 2019-10-29 ENCOUNTER — Other Ambulatory Visit: Payer: Self-pay

## 2019-10-29 ENCOUNTER — Ambulatory Visit (INDEPENDENT_AMBULATORY_CARE_PROVIDER_SITE_OTHER): Payer: Medicare Other | Admitting: Family Medicine

## 2019-10-29 ENCOUNTER — Encounter: Payer: Self-pay | Admitting: Family Medicine

## 2019-10-29 VITALS — BP 152/75 | HR 67 | Temp 98.6°F | Ht 64.0 in | Wt 141.6 lb

## 2019-10-29 DIAGNOSIS — R600 Localized edema: Secondary | ICD-10-CM

## 2019-10-29 DIAGNOSIS — R079 Chest pain, unspecified: Secondary | ICD-10-CM | POA: Diagnosis not present

## 2019-10-29 DIAGNOSIS — I499 Cardiac arrhythmia, unspecified: Secondary | ICD-10-CM

## 2019-10-29 DIAGNOSIS — R4689 Other symptoms and signs involving appearance and behavior: Secondary | ICD-10-CM

## 2019-10-29 NOTE — Progress Notes (Signed)
Assessment & Plan:  1. Irregular heart rhythm - Patient was in a normal rhythm today upon auscultation. EKG NSR.  - EKG 12-Lead - Ambulatory referral to Cardiology  2. Exertional chest pain - Patient and her husband would like a work-up by a cardiologist due to symptoms of exertional chest pain and bilateral lower extremity edema. She also seems to have some sort of abnormality in her rhythm that is not present at all times as it was present in November 2020 upon auscultation.  - Ambulatory referral to Cardiology  3. Bilateral lower extremity edema - Encouraged patient to elevate her legs if she is lying or sitting down instead of keeping them in a dependent position. Encouraged use of compression stockings and low salt diet.  - Ambulatory referral to Cardiology  4. Episode of abnormal behavior - Ambulatory referral to Neurology   Follow up plan: No follow-ups on file.  [name], MSN, APRN, FNP-C [facility]  Subjective:   Patient ID: Monica [name], female    DOB: 08-27-1950, 69 y.o.   MRN: [id number]  HPI: Monica [name] is a 69 y.o. female presenting on 10/29/2019 for ekg test  Patient is alone today for her appointment, but does have her husband, Lennette Bihari, on speaker phone to participate.   Patient presents today for an EKG. I had tried to complete one back in November 2020 when I auscultated her heart and her rhythm was irregular; she declined at that time. She does reports today that she has occasional chest pain with exertion that resolves with rest. This occurs about once a week. She denies any shortness of breath but does have to take a moment to focus on her breathing and breath a little deeper every now and then. She has been experiencing lower extremity edema recently.   Patient's husband is also concerned that the patient has episodes where she is "out of it" for 12 hours at a time. He reports she doesn't know what she is doing during this  time. She reports she doesn't remember anything she does during this time. She puts things where they don't belong and he will find her standing somewhere slumped over. When he asks her what she is doing, she will respond with something like - getting something to drink if she is standing in the kitchen. He feels something is just not right and would like a referral to a neurologist. He reports she has the episodes at least once every 2-3 weeks.    ROS: Negative unless specifically indicated above in HPI.   Relevant past medical history reviewed and updated as indicated.   Allergies and medications reviewed and updated.   Current Outpatient Medications:  .  atorvastatin (LIPITOR) 10 MG tablet, Take 1 tablet (10 mg total) by mouth daily., Disp: 30 tablet, Rfl: 2 .  blood glucose meter kit and supplies KIT, Dispense based on patient and insurance preference. Use up to four times daily as directed. (FOR ICD-9 250.00, 250.01)., Disp: 1 each, Rfl: 0 .  diphenhydrAMINE (BENADRYL) 25 MG tablet, Take 25 mg by mouth every 6 (six) hours as needed for allergies., Disp: , Rfl:  .  glipiZIDE (GLUCOTROL) 5 MG tablet, Take 1 tablet (5 mg total) by mouth daily before breakfast., Disp: 30 tablet, Rfl: 3 .  metFORMIN (GLUCOPHAGE) 500 MG tablet, Take 2 tablets (1,000 mg total) by mouth 2 (two) times daily., Disp: 360 tablet, Rfl: 1 .  methadone (DOLOPHINE) 10 MG tablet, Take 2 tablets (  20 mg total) by mouth 3 (three) times daily., Disp: 180 tablet, Rfl: 0 .  Misc Natural Products (OSTEO BI-FLEX ADV TRIPLE ST PO), Take by mouth., Disp: , Rfl:  .  Multiple Vitamins-Minerals (MULTI-VITAMIN GUMMIES PO), Take by mouth., Disp: , Rfl:  .  naproxen (NAPROSYN) 500 MG tablet, Take 1 tablet (500 mg total) by mouth 2 (two) times daily with a meal., Disp: 60 tablet, Rfl: 6 .  pantoprazole (PROTONIX) 40 MG tablet, Take 1 tablet (40 mg total) by mouth daily. 30 minutes before breakfast, Disp: 30 tablet, Rfl: 3 .  promethazine  (PHENERGAN) 25 MG tablet, TAKE 1 TABLET BY MOUTH EVERY 8 HOURS AS NEEDED FOR NAUSEA & VOMITING. (Patient taking differently: Take 25 mg by mouth every 8 (eight) hours as needed for nausea or vomiting. ), Disp: 60 tablet, Rfl: 3 .  RABEprazole (ACIPHEX) 20 MG tablet, Take 20 mg by mouth daily., Disp: , Rfl:  .  tapentadol (NUCYNTA) 50 MG tablet, Take 1 tablet (50 mg total) by mouth daily as needed (migraines)., Disp: 60 tablet, Rfl: 0 .  tiZANidine (ZANAFLEX) 2 MG tablet, TAKE (1) TABLET EVERY SIX HOURS AS NEEDED FOR MUSCLE SPASMS. (Patient taking differently: Take 2 mg by mouth every 6 (six) hours as needed for muscle spasms. ), Disp: 60 tablet, Rfl: 2 .  topiramate (TOPAMAX) 50 MG tablet, TAKE 2 TABLETS IN THE MORNING AND 4 TABLETS AT BEDTIME., Disp: 180 tablet, Rfl: 0 .  vitamin B-12 (CYANOCOBALAMIN) 1000 MCG tablet, Take 1,000 mcg by mouth daily., Disp: , Rfl:   Allergies  Allergen Reactions  . Aspirin Anaphylaxis and Hives    REACTION: slurred speech, blurred vision  . Hydromorphone Hcl Shortness Of Breath    severe  . Vimpat [Lacosamide] Other (See Comments)    Hallucinations, out of body experience, confusion   . Capsaicin     Red rash  . Depakote [Divalproex Sodium]   . Milnacipran Hcl Other (See Comments)    dizziness  . Pristiq [Desvenlafaxine Succinate Monohydrate] Other (See Comments)    dizziness    Objective:   BP (!) 152/75   Pulse 67   Temp 98.6 F (37 C) (Temporal)   Ht '5\' 4"'$  (1.626 m)   Wt 141 lb 9.6 oz (64.2 kg)   SpO2 99%   BMI 24.31 kg/m    Physical Exam Vitals reviewed.  Constitutional:      General: She is not in acute distress.    Appearance: Normal appearance. She is normal weight. She is not ill-appearing, toxic-appearing or diaphoretic.  HENT:     Head: Normocephalic and atraumatic.  Eyes:     General: No scleral icterus.       Right eye: No discharge.        Left eye: No discharge.     Conjunctiva/sclera: Conjunctivae normal.  Cardiovascular:      Rate and Rhythm: Normal rate and regular rhythm.     Heart sounds: Normal heart sounds. No murmur. No friction rub. No gallop.   Pulmonary:     Effort: Pulmonary effort is normal. No respiratory distress.     Breath sounds: Normal breath sounds. No stridor. No wheezing, rhonchi or rales.  Musculoskeletal:        General: Normal range of motion.     Cervical back: Normal range of motion.  Skin:    General: Skin is warm and dry.     Capillary Refill: Capillary refill takes less than 2 seconds.  Neurological:  General: No focal deficit present.     Mental Status: She is alert and oriented to person, place, and time. Mental status is at baseline.  Psychiatric:        Mood and Affect: Mood normal.        Behavior: Behavior normal.        Thought Content: Thought content normal.        Judgment: Judgment normal.

## 2019-10-30 ENCOUNTER — Encounter: Payer: Self-pay | Admitting: Family Medicine

## 2019-11-03 ENCOUNTER — Ambulatory Visit (INDEPENDENT_AMBULATORY_CARE_PROVIDER_SITE_OTHER): Payer: Medicare Other | Admitting: Cardiovascular Disease

## 2019-11-03 ENCOUNTER — Encounter: Payer: Self-pay | Admitting: Cardiovascular Disease

## 2019-11-03 ENCOUNTER — Other Ambulatory Visit: Payer: Self-pay

## 2019-11-03 ENCOUNTER — Telehealth: Payer: Self-pay

## 2019-11-03 VITALS — BP 117/73 | HR 86 | Temp 96.5°F | Ht 64.0 in | Wt 139.0 lb

## 2019-11-03 DIAGNOSIS — R600 Localized edema: Secondary | ICD-10-CM

## 2019-11-03 DIAGNOSIS — I499 Cardiac arrhythmia, unspecified: Secondary | ICD-10-CM | POA: Diagnosis not present

## 2019-11-03 DIAGNOSIS — R531 Weakness: Secondary | ICD-10-CM | POA: Diagnosis not present

## 2019-11-03 DIAGNOSIS — R079 Chest pain, unspecified: Secondary | ICD-10-CM

## 2019-11-03 NOTE — Patient Instructions (Signed)
Medication Instructions:  Your physician recommends that you continue on your current medications as directed. Please refer to the Current Medication list given to you today.  *If you need a refill on your cardiac medications before your next appointment, please call your pharmacy*   Lab Work: None today  If you have labs (blood work) drawn today and your tests are completely normal, you will receive your results only by: Marland Kitchen MyChart Message (if you have MyChart) OR . A paper copy in the mail If you have any lab test that is abnormal or we need to change your treatment, we will call you to review the results.   Testing/Procedures: Your physician has requested that you have an echocardiogram. Echocardiography is a painless test that uses sound waves to create images of your heart. It provides your doctor with information about the size and shape of your heart and how well your heart's chambers and valves are working. This procedure takes approximately one hour. There are no restrictions for this procedure.  Your physician has requested that you have a lexiscan myoview. For further information please visit [email or web address] Please follow instruction sheet, as given.     Follow-Up: At [facility], you and your health needs are our priority.  As part of our continuing mission to provide you with exceptional heart care, we have created designated Provider Care Teams.  These Care Teams include your primary Cardiologist (physician) and Advanced Practice Providers (APPs -  Physician Assistants and Nurse Practitioners) who all work together to provide you with the care you need, when you need it.  We recommend signing up for the patient portal called "MyChart".  Sign up information is provided on this After Visit Summary.  MyChart is used to connect with patients for Virtual Visits (Telemedicine).  Patients are able to view lab/test results, encounter notes, upcoming appointments, etc.   Non-urgent messages can be sent to your provider as well.   To learn more about what you can do with MyChart, go to [email or web address].    Your next appointment:   2 month(s)  The format for your next appointment:   Virtual Visit   Provider:   Prentice Docker, MD   Other Instructions None      Thank you for choosing [facility] !

## 2019-11-03 NOTE — Progress Notes (Signed)
CARDIOLOGY CONSULT NOTE  Patient ID: Monica [name] MRN: [id number] DOB/AGE: 67-16-1952 67 y.o.  Admit date: (Not on file) Primary Physician: [name], FNP   Reason for Consultation: Arrhythmia and chest pain  HPI: Monica [name] is a 69 y.o. female who is being seen today for the evaluation of arrhythmia and chest pain at the request of [name], FNP.  Past medical history includes type 2 diabetes mellitus, generalized weakness, falls, and chronic pain management.  I reviewed PCP notes in the EMR.  She was noted to have an irregular heart rhythm at an office visit on 10/29/2019.  She also complained of exertional chest pain and bilateral leg edema.  Labs on 10/22/2019 demonstrated BUN 21 and creatinine 1.05, sodium 136, potassium 4.9.  I personally reviewed the ECG performed on 10/29/2019 which demonstrated sinus rhythm with sinus arrhythmia and late R wave transition.  She is here with her husband.  She has apparently been going downhill over the past 6 to 8 months.  She has developed bilateral leg swelling and pain over the last 1 month.  She has had episodic retrosternal sharp chest pains over the past 6 to 8 months.  She has noticed having to take a deep breath over the past 6 to 8 months as well.  Leg swelling improves with leg elevation.    She walks with a cane.  Her husband said that a year ago she was not like this.  She has episodes of confusion and disorientation at home.  She is also scheduled to see neurology.  Weights at home range from 136-138 pounds.  Social history: Originally from [place] in the [place] area of [place].  They plan to move back there later this year as that is where their children are.  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   Allergies  Allergen Reactions  . Aspirin Anaphylaxis and Hives    REACTION: slurred speech, blurred vision  .  Hydromorphone Hcl Shortness Of Breath    severe  . Vimpat [Lacosamide] Other (See Comments)    Hallucinations, out of body experience, confusion   . Capsaicin     Red rash  . Depakote [Divalproex Sodium]   . Milnacipran Hcl Other (See Comments)    dizziness  . Pristiq [Desvenlafaxine Succinate Monohydrate] Other (See Comments)    dizziness    Current Outpatient Medications  Medication Sig Dispense Refill  . atorvastatin (LIPITOR) 10 MG tablet Take 1 tablet (10 mg total) by mouth daily. 30 tablet 2  . diphenhydrAMINE (BENADRYL) 25 MG tablet Take 25 mg by mouth every 6 (six) hours as needed for allergies.    Marland Kitchen glipiZIDE (GLUCOTROL) 5 MG tablet Take 1 tablet (5 mg total) by mouth daily before breakfast. 30 tablet 3  . metFORMIN (GLUCOPHAGE) 500 MG tablet Take 2 tablets (1,000 mg total) by mouth 2 (two) times daily. 360 tablet 1  . methadone (DOLOPHINE) 10 MG tablet Take 2 tablets (20 mg total) by mouth 3 (three) times daily. 180 tablet 0  . Misc Natural Products (OSTEO BI-FLEX ADV TRIPLE ST PO) Take by mouth.    . Multiple Vitamins-Minerals (MULTI-VITAMIN GUMMIES PO) Take by mouth.    . naproxen (NAPROSYN) 500 MG tablet Take 1 tablet (500 mg total) by mouth 2 (two) times daily with a meal. 60 tablet 6  . pantoprazole (PROTONIX) 40 MG tablet Take 1 tablet (40  mg total) by mouth daily. 30 minutes before breakfast 30 tablet 3  . promethazine (PHENERGAN) 25 MG tablet TAKE 1 TABLET BY MOUTH EVERY 8 HOURS AS NEEDED FOR NAUSEA & VOMITING. (Patient taking differently: Take 25 mg by mouth every 8 (eight) hours as needed for nausea or vomiting. ) 60 tablet 3  . tapentadol (NUCYNTA) 50 MG tablet Take 1 tablet (50 mg total) by mouth daily as needed (migraines). 60 tablet 0  . tiZANidine (ZANAFLEX) 2 MG tablet TAKE (1) TABLET EVERY SIX HOURS AS NEEDED FOR MUSCLE SPASMS. (Patient taking differently: Take 2 mg by mouth every 6 (six) hours as needed for muscle spasms. ) 60 tablet 2  . topiramate (TOPAMAX) 50  MG tablet TAKE 2 TABLETS IN THE MORNING AND 4 TABLETS AT BEDTIME. 180 tablet 0  . vitamin B-12 (CYANOCOBALAMIN) 1000 MCG tablet Take 1,000 mcg by mouth daily.     No current facility-administered medications for this visit.    Past Medical History:  Diagnosis Date  . Arthritis   . Cervical facet joint syndrome 04/22/2012  . Cervicalgia   . Chronic headaches    Dr Hermelinda Medicus [place] [facility]  . Chronic nausea    normal GES   . Complication of anesthesia    had problem with local anesthesia-tried to assist Physician  . DM (diabetes mellitus) (HCC)   . Elevated liver enzymes    hx of, normal 03/2010  . GERD (gastroesophageal reflux disease) 05/31/2011   History Schatzki's ring, erosive reflux esophagitis, status post last EGD and dilation 04/05/10   . Glaucoma    lens in both eyes  . Hemorrhoids 11/17/08   Colonoscopy Dr Karilyn Cota  . Occipital neuralgia   . PTSD (post-traumatic stress disorder)   . Renal lithiasis   . 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 Torticollis   . Unspecified musculoskeletal disorders and symptoms referable to neck    cervical/trapezius    Past Surgical History:  Procedure Laterality Date  . BREAST LUMPECTOMY     left  . CATARACT EXTRACTION W/PHACO Left 10/07/2016   Procedure: CATARACT EXTRACTION PHACO AND INTRAOCULAR LENS PLACEMENT (IOC);  Surgeon: Gemma Payor, MD;  Location: [facility];  Service: Ophthalmology;  Laterality: Left;  CDE: 4.98  . CATARACT EXTRACTION W/PHACO Right 10/21/2016   Procedure: CATARACT EXTRACTION PHACO AND INTRAOCULAR LENS PLACEMENT RIGHT EYE CDE - 6.24;  Surgeon: Gemma Payor, MD;  Location: [facility];  Service: Ophthalmology;  Laterality: Right;  right  . CHOLECYSTECTOMY    . COLONOSCOPY  11/17/08   ext hemorrhoids  . CRANIOTOMY     secondary TBI  . CYST EXCISION     left wrist  . ESOPHAGOGASTRODUODENOSCOPY  04/05/10   [name] (70F),erosive reflux esophagitis, small hiatal hernia,  . ESOPHAGOGASTRODUODENOSCOPY (EGD)  WITH PROPOFOL N/A 08/30/2019   Procedure: ESOPHAGOGASTRODUODENOSCOPY (EGD) WITH PROPOFOL;  Surgeon: Corbin Ade, MD;  Location: [facility];  Service: Endoscopy;  Laterality: N/A;  3:30pm - pt knows to arrive at 9:45  . FINGER SURGERY     removal cyst left pinky  . gastro empy study  03/03/10   normal  . HAND SURGERY    . MALONEY DILATION N/A 08/30/2019   Procedure: Elease Hashimoto DILATION;  Surgeon: Corbin Ade, MD;  Location: [facility];  Service: Endoscopy;  Laterality: N/A;  . NECK SURGERY  1998  . NEPHROLITHOTOMY  05/13/2012   Procedure: NEPHROLITHOTOMY PERCUTANEOUS;  Surgeon: Marcine Matar, MD;  Location: [facility];  Service: Urology;  Laterality: Left;       Social History   Socioeconomic History  . Marital status: Married    Spouse name: Not on file  . Number of children: 3  . Years of education: Not on file  . Highest education level: Not on file  Occupational History  . Occupation: disabled    Fish farm manager: UNEMPLOYED  Tobacco Use  . Smoking status: Never Smoker  . Smokeless tobacco: Never Used  Substance and Sexual Activity  . Alcohol use: No  . Drug use: No  . Sexual activity: Yes    Birth control/protection: Post-menopausal  Other Topics Concern  . Not on file  Social History Narrative  . Not on file   Social Determinants of Health   Financial Resource Strain:   . Difficulty of Paying Living Expenses:   Food Insecurity:   . Worried About Charity fundraiser in the Last Year:   . Arboriculturist in the Last Year:   Transportation Needs:   . Film/video editor (Medical):   Marland Kitchen Lack of Transportation (Non-Medical):   Physical Activity:   . Days of Exercise per Week:   . Minutes of Exercise per Session:   Stress:   . Feeling of Stress :   Social Connections:   . Frequency of Communication with Friends and Family:   . Frequency of Social Gatherings with Friends and Family:   . Attends Religious Services:   . Active Member of Clubs or Organizations:     . Attends Archivist Meetings:   Marland Kitchen Marital Status:   Intimate Partner Violence:   . Fear of Current or Ex-Partner:   . Emotionally Abused:   Marland Kitchen Physically Abused:   . Sexually Abused:      No family history of premature CAD in 1st degree relatives.  Current Meds  Medication Sig  . atorvastatin (LIPITOR) 10 MG tablet Take 1 tablet (10 mg total) by mouth daily.  . diphenhydrAMINE (BENADRYL) 25 MG tablet Take 25 mg by mouth every 6 (six) hours as needed for allergies.  Marland Kitchen glipiZIDE (GLUCOTROL) 5 MG tablet Take 1 tablet (5 mg total) by mouth daily before breakfast.  . metFORMIN (GLUCOPHAGE) 500 MG tablet Take 2 tablets (1,000 mg total) by mouth 2 (two) times daily.  . methadone (DOLOPHINE) 10 MG tablet Take 2 tablets (20 mg total) by mouth 3 (three) times daily.  . Misc Natural Products (OSTEO BI-FLEX ADV TRIPLE ST PO) Take by mouth.  . Multiple Vitamins-Minerals (MULTI-VITAMIN GUMMIES PO) Take by mouth.  . naproxen (NAPROSYN) 500 MG tablet Take 1 tablet (500 mg total) by mouth 2 (two) times daily with a meal.  . pantoprazole (PROTONIX) 40 MG tablet Take 1 tablet (40 mg total) by mouth daily. 30 minutes before breakfast  . promethazine (PHENERGAN) 25 MG tablet TAKE 1 TABLET BY MOUTH EVERY 8 HOURS AS NEEDED FOR NAUSEA & VOMITING. (Patient taking differently: Take 25 mg by mouth every 8 (eight) hours as needed for nausea or vomiting. )  . tapentadol (NUCYNTA) 50 MG tablet Take 1 tablet (50 mg total) by mouth daily as needed (migraines).  Marland Kitchen tiZANidine (ZANAFLEX) 2 MG tablet TAKE (1) TABLET EVERY SIX HOURS AS NEEDED FOR MUSCLE SPASMS. (Patient taking differently: Take 2 mg by mouth every 6 (six) hours as needed for muscle spasms. )  . topiramate (TOPAMAX) 50 MG tablet TAKE 2 TABLETS IN THE MORNING AND 4 TABLETS AT BEDTIME.  . vitamin B-12 (CYANOCOBALAMIN) 1000 MCG tablet Take 1,000  mcg by mouth daily.      Review of systems complete and found to be negative unless listed above in  HPI   Marlyn Corporal, RN was present throughout the entirety of the encounter.  Physical exam Blood pressure 117/73, pulse 86, temperature (!) 96.5 F (35.8 C), height 5\' 4"  (1.626 m), weight 139 lb (63 kg). General: NAD Neck: No JVD, no thyromegaly or thyroid nodule.  Lungs: Clear to auscultation bilaterally with normal respiratory effort. CV: Nondisplaced PMI. Regular rate and rhythm, normal S1/S2, no S3/S4, no murmur.  1+ pitting bilateral lower extremity edema.  No carotid bruit.    Abdomen: Soft, nontender, no distention.  Skin: Intact without lesions or rashes.  Neurologic: Alert and oriented x 3.  Psych: Normal affect. Extremities: No clubbing or cyanosis.  HEENT: Normal.   ECG: Most recent ECG reviewed.   Labs: Lab Results  Component Value Date/Time   K 4.9 10/22/2019 10:12 AM   BUN 21 10/22/2019 10:12 AM   CREATININE 1.05 (H) 10/22/2019 10:12 AM   CREATININE 1.09 05/04/2012 11:09 AM   ALT 14 10/22/2019 10:12 AM   HGB 12.7 06/29/2019 03:24 PM     Lipids: Lab Results  Component Value Date/Time   LDLCALC 81 06/29/2019 03:24 PM   CHOL 147 06/29/2019 03:24 PM   TRIG 174 (H) 06/29/2019 03:24 PM   HDL 36 (L) 06/29/2019 03:24 PM        ASSESSMENT AND PLAN:   1.  Chest pain: Episodes are atypical and sporadic and primarily occur at rest.  She does have a history of type 2 diabetes mellitus.  She also has a history of erosive reflux esophagitis.  In order to rule out ischemic heart disease, I will obtain a Lexiscan Myoview.  2.  Bilateral leg edema: She likely has a component of chronic venous insufficiency.  I may prescribe compression stockings in the future.  I will obtain an echocardiogram to evaluate cardiac structure and function.  3.  Generalized weakness: Unclear etiology.  Cardiac studies to be pursued as noted above.  She is also scheduled to see neurology.  4.  Arrhythmia: She appears to have sinus rhythm with sinus arrhythmia.  She denies  palpitations.  Cardiac studies to be pursued as noted above.    Disposition: Follow up in 2 months virtual visit   Signed: 07/01/2019, M.D., F.A.C.C.  11/03/2019, 10:51 AM

## 2019-11-03 NOTE — Telephone Encounter (Signed)
Virtual Visit Pre-Appointment Phone Call  "(Name), I am calling you today to discuss your upcoming appointment. We are currently trying to limit exposure to the virus that causes COVID-19 by seeing patients at home rather than in the office."  1. "What is the BEST phone number to call the day of the visit?" - include this in appointment notes  2. "Do you have or have access to (through a family member/friend) a smartphone with video capability that we can use for your visit?" a. If yes - list this number in appt notes as "cell" (if different from BEST phone #) and list the appointment type as a VIDEO visit in appointment notes b. If no - list the appointment type as a PHONE visit in appointment notes  3. Confirm consent - "In the setting of the current Covid19 crisis, you are scheduled for a (phone or video) visit with your provider on (date) at (time).  Just as we do with many in-office visits, in order for you to participate in this visit, we must obtain consent.  If you'd like, I can send this to your mychart (if signed up) or email for you to review.  Otherwise, I can obtain your verbal consent now.  All virtual visits are billed to your insurance company just like a normal visit would be.  By agreeing to a virtual visit, we'd like you to understand that the technology does not allow for your provider to perform an examination, and thus may limit your provider's ability to fully assess your condition. If your provider identifies any concerns that need to be evaluated in person, we will make arrangements to do so.  Finally, though the technology is pretty good, we cannot assure that it will always work on either your or our end, and in the setting of a video visit, we may have to convert it to a phone-only visit.  In either situation, we cannot ensure that we have a secure connection.  Are you willing to proceed?" STAFF: Did the patient verbally acknowledge consent to telehealth visit? Document  YES/NO here:   4. Advise patient to be prepared - "Two hours prior to your appointment, go ahead and check your blood pressure, pulse, oxygen saturation, and your weight (if you have the equipment to check those) and write them all down. When your visit starts, your provider will ask you for this information. If you have an Apple Watch or Kardia device, please plan to have heart rate information ready on the day of your appointment. Please have a pen and paper handy nearby the day of the visit as well."  5. Give patient instructions for MyChart download to smartphone OR Doximity/Doxy.me as below if video visit (depending on what platform provider is using)  6. Inform patient they will receive a phone call 15 minutes prior to their appointment time (may be from unknown caller ID) so they should be prepared to answer    TELEPHONE CALL NOTE  Monica [name] has been deemed a candidate for a follow-up tele-health visit to limit community exposure during the Covid-19 pandemic. I spoke with the patient via phone to ensure availability of phone/video source, confirm preferred email & phone number, and discuss instructions and expectations.  I reminded Monica [name] to be prepared with any vital sign and/or heart rhythm information that could potentially be obtained via home monitoring, at the time of her visit. I reminded Monica [name] to expect a phone call prior to  her visit.  Monica [name] 11/03/2019 11:32 AM   INSTRUCTIONS FOR DOWNLOADING THE MYCHART APP TO SMARTPHONE  - The patient must first make sure to have activated MyChart and know their login information - If Apple, go to [employer] and type in MyChart in the search bar and download the app. If Android, ask patient to go to [employer] and type in [place] in the search bar and download the app. The app is free but as with any other app downloads, their phone may require them to verify saved payment information or Apple/Android  password.  - The patient will need to then log into the app with their MyChart username and password, and select [facility] as their healthcare provider to link the account. When it is time for your visit, go to the MyChart app, find appointments, and click Begin Video Visit. Be sure to Select Allow for your device to access the Microphone and Camera for your visit. You will then be connected, and your provider will be with you shortly.  **If they have any issues connecting, or need assistance please contact MyChart service desk [phone])**  **If using a computer, in order to ensure the best quality for their visit they will need to use either of the following Internet Browsers: [employer] or Google Chrome**  IF USING DOXIMITY or DOXY.ME - The patient will receive a link just prior to their visit by text.     FULL LENGTH CONSENT FOR TELE-HEALTH VISIT   I hereby voluntarily request, consent and authorize [facility] and its employed or contracted physicians, physician assistants, nurse practitioners or other licensed health care professionals (the Practitioner), to provide me with telemedicine health care services (the "Services") as deemed necessary by the treating Practitioner. I acknowledge and consent to receive the Services by the Practitioner via telemedicine. I understand that the telemedicine visit will involve communicating with the Practitioner through live audiovisual communication technology and the disclosure of certain medical information by electronic transmission. I acknowledge that I have been given the opportunity to request an in-person assessment or other available alternative prior to the telemedicine visit and am voluntarily participating in the telemedicine visit.  I understand that I have the right to withhold or withdraw my consent to the use of telemedicine in the course of my care at any time, without affecting my right to future care or treatment,  and that the Practitioner or I may terminate the telemedicine visit at any time. I understand that I have the right to inspect all information obtained and/or recorded in the course of the telemedicine visit and may receive copies of available information for a reasonable fee.  I understand that some of the potential risks of receiving the Services via telemedicine include:  Marland Kitchen Delay or interruption in medical evaluation due to technological equipment failure or disruption; . Information transmitted may not be sufficient (e.g. poor resolution of images) to allow for appropriate medical decision making by the Practitioner; and/or  . In rare instances, security protocols could fail, causing a breach of personal health information.  Furthermore, I acknowledge that it is my responsibility to provide information about my medical history, conditions and care that is complete and accurate to the best of my ability. I acknowledge that Practitioner's advice, recommendations, and/or decision may be based on factors not within their control, such as incomplete or inaccurate data provided by me or distortions of diagnostic images or specimens that may result from electronic transmissions. I understand  that the practice of medicine is not an exact science and that Practitioner makes no warranties or guarantees regarding treatment outcomes. I acknowledge that I will receive a copy of this consent concurrently upon execution via email to the email address I last provided but may also request a printed copy by calling the office of [facility].    I understand that my insurance will be billed for this visit.   I have read or had this consent read to me. . I understand the contents of this consent, which adequately explains the benefits and risks of the Services being provided via telemedicine.  . I have been provided ample opportunity to ask questions regarding this consent and the Services and have had my questions  answered to my satisfaction. . I give my informed consent for the services to be provided through the use of telemedicine in my medical care  By participating in this telemedicine visit I agree to the above.

## 2019-11-11 ENCOUNTER — Other Ambulatory Visit: Payer: Self-pay

## 2019-11-11 ENCOUNTER — Ambulatory Visit (INDEPENDENT_AMBULATORY_CARE_PROVIDER_SITE_OTHER): Payer: Medicare Other | Admitting: Neurology

## 2019-11-11 ENCOUNTER — Encounter: Payer: Self-pay | Admitting: Neurology

## 2019-11-11 VITALS — BP 160/68 | HR 58 | Temp 97.1°F | Ht 64.0 in | Wt 139.0 lb

## 2019-11-11 DIAGNOSIS — G9340 Encephalopathy, unspecified: Secondary | ICD-10-CM | POA: Diagnosis not present

## 2019-11-11 DIAGNOSIS — R419 Unspecified symptoms and signs involving cognitive functions and awareness: Secondary | ICD-10-CM

## 2019-11-11 NOTE — Patient Instructions (Addendum)
EEG in the office and then a prolonged EEG   Electroencephalogram, Adult An electroencephalogram (EEG) is a test that records electrical activity in the brain. It is often used to diagnose or monitor problems that are related to the brain, such as:  Seizure disorders.  Sleeping problems.  Changes in behavior.  Head injuries.  Fainting spells. Tell a health care provider about:  Any allergies you have.  All medicines you are taking, including vitamins, herbs, eye drops, creams, and over-the-counter medicines.  Any problems you or family members have had with anesthetic medicines.  Any blood disorders you have.  Any surgeries you have had.  Any medical conditions you have or have had, including psychiatric conditions.  Any history of illegal drug use or alcohol abuse. What are the risks? Generally, this is a safe test. If you have a seizure disorder, you may be made to have a seizure during the test. This is done so that your brain activity can be recorded during the seizure. What happens before the procedure?   Arrive with your hair clean and dry. Do not comb your hair toward your scalp to add volume (backcomb), and do not put hair spray, oil, or other hair products in your hair.  Do not have caffeine within 4 hours of having your test.  Follow instructions from your health care provider about: ? Other eating or drinking restrictions. ? Sleeping before the test.  Ask your health care provider about taking your regular and over-the-counter medicines, herbs, and supplements. What happens during the procedure? You will be asked to sit in a chair or lie down. Small metal discs (electrodes) will be attached to your head with an adhesive. These electrodes will pick up on the signals in your brain, and a machine will record the signals. During the test, you may be asked to:  Sit or lie quietly and relax.  Open and close your eyes.  Breathe deeply and rapidly for 3 minutes  or longer.  Look at a flashing light for a short period of time.  Read or look at an image.  Go to sleep. When the test is complete, the electrodes will be removed by using a solution such as acetone. What happens after the procedure? It is up to you to get the results of your procedure. Ask your health care provider, or the department that is doing the procedure, when your results will be ready. Summary  An electroencephalogram (EEG) is a test that is often used to diagnose or monitor problems that are related to the brain.  Do not have caffeine within 4 hours of having your test. Follow other instructions from your health care provider about eating and drinking before the test.  During the procedure, small metal discs (electrodes) will be attached to your head with an adhesive.  During the test, you may be asked to sit or lie quietly and relax. You may also be asked to do other activities during the test. This information is not intended to replace advice given to you by your health care provider. Make sure you discuss any questions you have with your health care provider. Document Revised: 06/02/2018 Document Reviewed: 06/02/2018 Elsevier Patient Education  2020 [employer].

## 2019-11-11 NOTE — Progress Notes (Signed)
[id number]    Provider:  Dr Lucia Gaskins Requesting Provider: [name], FNP Primary Care Provider:  [name], FNP  CC:  Pain all over my body  HPI:  Monica [name] is a 69 y.o. female here as requested by [name], FNP for episode of abnormal behavior.  Past medical history chronic pain on chronic opioids, torticollis, seizure 12 years ago which is medical related, posttraumatic stress disorder, occipital neuralgia, elevated liver enzymes, diabetes, chronic nausea, chronic headaches, cervicalgia, arthritis.  She is here with her husband who also provides much information.  She sees Dr. Hermelinda Medicus as her pain specialist.  She says that in the last 6 months she is going downhill, she is been disabled for 20 years, lots of pain and she is on heavy pain medication, she has days where she is almost in a stupor, may take 24 hours to come out of it, finding her standing normal sleeping, on a good day her mind is intact, extreme padding in the chest. She has severe pain in her legs and back and feels she can't make it due to the pain, she has severe pain, all of it is getting wrapped together. She has periods of time where she feels like she is sleep walking, goes about the house walking around, she will be standing somewhere totally asleep and dazed out, se will hear her husband say "what are you doing" and then the next few days she will find things put away in the wrong places in cabinets where it doesn't belong and she doesn't remember doing it. She saw an orthopaedic doctor, dr Ophelia Charter and ave her shots in the back, they did an mri of the lumbar spine, her thumb is numb, she has chronic back pain. No snoring but there are nights she doesn't sleep. Episodes happen every 2 weeks.    Reviewed notes, labs and imaging from outside physicians, which showed:  cmp 06/29/2019 showed glucose 527, BUN 18, creatinine 0.97, cbc was normal, hgba1c > 14  MRI lumbar spine 10/14/2019  personally reviewed images and agree   T12-L1:  Negative.  L1-L2:  Negative.  L2-L3: Mild disc desiccation, disc space loss and circumferential disc bulge eccentric to the left. Broad-based left foraminal component with up to moderate left L2 foraminal stenosis.  L3-L4: Mild disc bulge eccentric to the left. Mild posterior element hypertrophy. No spinal or lateral recess stenosis. Borderline to mild left L3 foraminal stenosis.  L4-L5: Disc space loss and circumferential disc bulge with endplate spurring eccentric far laterally to the right. Moderate facet hypertrophy greater on the right. No spinal or lateral recess stenosis. Mild L4 foraminal stenosis.  L5-S1: Subtle anterolisthesis with normal disc. Severe left facet hypertrophy. Moderate right facet and bilateral ligament flavum hypertrophy. Mild epidural lipomatosis. No significant spinal stenosis. There is mild left lateral recess stenosis (left S1 nerve level). No foraminal stenosis.  IMPRESSION: 1. Levoconvex lumbar scoliosis with mostly left side degenerative neural stenosis. There is severe facet degeneration at both the L4-L5 (greater on the right) and L5-S1 (greater on the left) levels. No acute osseous abnormality.  2. No significant lumbar spinal stenosis. Mild left lateral recess stenosis at L5-S1. Up to moderate left L2 foraminal stenosis. Mild foraminal stenosis otherwise.   Review of Systems: Patient complains of symptoms per HPI as well as the following symptoms:chronic pain. Pertinent negatives and positives per HPI. All others negative.   Social History   Socioeconomic History  . Marital status: Married  Spouse name: Not on file  . Number of children: 3  . Years of education: Not on file  . Highest education level: Not on file  Occupational History  . Occupation: disabled    Associate Professor: UNEMPLOYED  Tobacco Use  . Smoking status: Never Smoker  . Smokeless tobacco: Never Used  Substance  and Sexual Activity  . Alcohol use: No  . Drug use: No  . Sexual activity: Yes    Birth control/protection: Post-menopausal  Other Topics Concern  . Not on file  Social History Narrative   Lives at home with husband    Right handed   Caffeine: none   Social Determinants of Health   Financial Resource Strain:   . Difficulty of Paying Living Expenses:   Food Insecurity:   . Worried About Programme researcher, broadcasting/film/video in the Last Year:   . Barista in the Last Year:   Transportation Needs:   . Freight forwarder (Medical):   Marland Kitchen Lack of Transportation (Non-Medical):   Physical Activity:   . Days of Exercise per Week:   . Minutes of Exercise per Session:   Stress:   . Feeling of Stress :   Social Connections:   . Frequency of Communication with Friends and Family:   . Frequency of Social Gatherings with Friends and Family:   . Attends Religious Services:   . Active Member of Clubs or Organizations:   . Attends Banker Meetings:   Marland Kitchen Marital Status:   Intimate Partner Violence:   . Fear of Current or Ex-Partner:   . Emotionally Abused:   Marland Kitchen Physically Abused:   . Sexually Abused:     Family History  Problem Relation Age of Onset  . Colon cancer Mother 48  . Heart disease Father   . Diabetes Father   . Diabetes Paternal Grandmother   . Heart disease Paternal Grandfather   . Diverticulitis Daughter     Past Medical History:  Diagnosis Date  . Arthritis   . Cervical facet joint syndrome 04/22/2012  . Cervicalgia   . Chronic headaches    Dr Hermelinda Medicus [place] Pain Specialist  . Chronic nausea    normal GES   . Complication of anesthesia    had problem with local anesthesia-tried to assist Physician  . DM (diabetes mellitus) (HCC)   . Elevated liver enzymes    hx of, normal 03/2010  . GERD (gastroesophageal reflux disease) 05/31/2011   History Schatzki's ring, erosive reflux esophagitis, status post last EGD and dilation 04/05/10   . Glaucoma    lens in both  eyes  . Hemorrhoids 11/17/08   Colonoscopy Dr Karilyn Cota  . Occipital neuralgia   . PTSD (post-traumatic stress disorder)   . Renal lithiasis   . 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 Torticollis   . Unspecified musculoskeletal disorders and symptoms referable to neck    cervical/trapezius    Patient Active Problem List   Diagnosis Date Noted  . Quadriceps weakness 10/19/2019  . Arthritis of right sacroiliac joint 08/18/2019  . Altered bowel habits 08/03/2019  . Trochanteric bursitis, left hip 07/20/2019  . Uncontrolled type 2 diabetes mellitus with hyperglycemia, without long-term current use of insulin (HCC) 06/17/2019  . PTSD (post-traumatic stress disorder) 10/15/2016  . Pain management 10/06/2015  . Encounter for therapeutic drug level monitoring 10/02/2015  . Occipital neuralgia (Location of Primary Source of Pain) (Bilateral) (L>R) 10/02/2015  . Chronic cervical radicular pain (Left) 08/31/2015  .  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 Cervical facet arthropathy (severe at C3-4) (Bilateral) (L>R) 07/26/2015  . Cervical foraminal stenosis (C3-4) (Left) 07/26/2015  . Cervical spondylosis 06/13/2015  . Cervical facet syndrome (Location of Secondary source of pain) (Bilateral) (L>R) 06/13/2015  . Chronic pain 06/13/2015  . Cervicogenic headache (Location of Primary Source of Pain) (Bilateral) (L>R) 06/13/2015  . Chronic neck pain (Location of Secondary source of pain) (Bilateral) (L>R) 06/13/2015  . Long term current use of opiate analgesic 06/13/2015  . Opiate use (600 MME/Day) 06/13/2015  . Opiate dependence ([place]) 06/13/2015  . Myofascial muscle pain 04/19/2013  . Dyspnea 02/10/2013  . Intractable chronic migraine without aura and with status migrainosus 04/22/2012  . Depression 04/22/2012  . Borderline personality disorder ([place]) 04/22/2012  . GERD with esophagitis 05/31/2011    Past Surgical History:  Procedure Laterality Date  . BREAST  LUMPECTOMY     left  . CATARACT EXTRACTION W/PHACO Left 10/07/2016   Procedure: CATARACT EXTRACTION PHACO AND INTRAOCULAR LENS PLACEMENT (IOC);  Surgeon: Tonny Branch, MD;  Location: [facility];  Service: Ophthalmology;  Laterality: Left;  CDE: 4.98  . CATARACT EXTRACTION W/PHACO Right 10/21/2016   Procedure: CATARACT EXTRACTION PHACO AND INTRAOCULAR LENS PLACEMENT RIGHT EYE CDE - 6.24;  Surgeon: Tonny Branch, MD;  Location: [facility];  Service: Ophthalmology;  Laterality: Right;  right  . CHOLECYSTECTOMY    . COLONOSCOPY  11/17/08   ext hemorrhoids  . CRANIOTOMY     secondary TBI  . CYST EXCISION     left wrist  . ESOPHAGOGASTRODUODENOSCOPY  04/05/10   [name] (80F),erosive reflux esophagitis, small hiatal hernia,  . ESOPHAGOGASTRODUODENOSCOPY (EGD) WITH PROPOFOL N/A 08/30/2019   Procedure: ESOPHAGOGASTRODUODENOSCOPY (EGD) WITH PROPOFOL;  Surgeon: Daneil Dolin, MD;  Location: [facility];  Service: Endoscopy;  Laterality: N/A;  3:30pm - pt knows to arrive at 9:45  . FINGER SURGERY     removal cyst left pinky  . gastro empy study  03/03/10   normal  . HAND SURGERY    . MALONEY DILATION N/A 08/30/2019   Procedure: Venia Minks DILATION;  Surgeon: Daneil Dolin, MD;  Location: [facility];  Service: Endoscopy;  Laterality: N/A;  . NECK SURGERY  1998  . NEPHROLITHOTOMY  05/13/2012   Procedure: NEPHROLITHOTOMY PERCUTANEOUS;  Surgeon: Franchot Gallo, MD;  Location: [facility];  Service: Urology;  Laterality: Left;       Current Outpatient Medications  Medication Sig Dispense Refill  . atorvastatin (LIPITOR) 10 MG tablet Take 1 tablet (10 mg total) by mouth daily. 30 tablet 2  . diphenhydrAMINE (BENADRYL) 25 MG tablet Take 25 mg by mouth every 6 (six) hours as needed for allergies.    Marland Kitchen glipiZIDE (GLUCOTROL) 5 MG tablet Take 1 tablet (5 mg total) by mouth daily before breakfast. 30 tablet 3  . metFORMIN (GLUCOPHAGE) 500 MG tablet Take 2 tablets (1,000 mg total) by mouth 2 (two) times daily.  360 tablet 1  . methadone (DOLOPHINE) 10 MG tablet Take 2 tablets (20 mg total) by mouth 3 (three) times daily. 180 tablet 0  . Misc Natural Products (OSTEO BI-FLEX ADV TRIPLE ST PO) Take by mouth.    . Multiple Vitamins-Minerals (MULTI-VITAMIN GUMMIES PO) Take by mouth.    . naproxen (NAPROSYN) 500 MG tablet Take 1 tablet (500 mg total) by mouth 2 (two) times daily with a meal. 60 tablet 6  . pantoprazole (PROTONIX) 40 MG tablet Take 1 tablet (40 mg total) by mouth  daily. 30 minutes before breakfast 30 tablet 3  . promethazine (PHENERGAN) 25 MG tablet TAKE 1 TABLET BY MOUTH EVERY 8 HOURS AS NEEDED FOR NAUSEA & VOMITING. (Patient taking differently: Take 25 mg by mouth every 8 (eight) hours as needed for nausea or vomiting. ) 60 tablet 3  . tapentadol (NUCYNTA) 50 MG tablet Take 1 tablet (50 mg total) by mouth daily as needed (migraines). 60 tablet 0  . tiZANidine (ZANAFLEX) 2 MG tablet TAKE (1) TABLET EVERY SIX HOURS AS NEEDED FOR MUSCLE SPASMS. (Patient taking differently: Take 2 mg by mouth every 6 (six) hours as needed for muscle spasms. ) 60 tablet 2  . topiramate (TOPAMAX) 50 MG tablet TAKE 2 TABLETS IN THE MORNING AND 4 TABLETS AT BEDTIME. 180 tablet 0  . vitamin B-12 (CYANOCOBALAMIN) 1000 MCG tablet Take 1,000 mcg by mouth daily.     No current facility-administered medications for this visit.    Allergies as of 11/11/2019 - Review Complete 11/11/2019  Allergen Reaction Noted  . Aspirin Anaphylaxis and Hives   . Hydromorphone hcl Shortness Of Breath 11/28/2014  . Vimpat [lacosamide] Other (See Comments) 08/07/2016  . Capsaicin  10/19/2013  . Depakote [divalproex sodium]  03/08/2014  . Milnacipran hcl Other (See Comments) 09/09/2011  . Pristiq [desvenlafaxine succinate monohydrate] Other (See Comments) 09/09/2011    Vitals: BP (!) 160/68 (BP Location: Right Arm, Patient Position: Sitting)   Pulse (!) 58   Temp (!) 97.1 F (36.2 C) Comment: husband 98.2, taken at front  Ht 5\' 4"   (1.626 m)   Wt 139 lb (63 kg)   BMI 23.86 kg/m  Last Weight:  Wt Readings from Last 1 Encounters:  11/11/19 139 lb (63 kg)   Last Height:   Ht Readings from Last 1 Encounters:  11/11/19 5\' 4"  (1.626 m)     Physical exam: Exam: Gen: says she is in chronic pain, conversant, well nourised, well groomed                     CV: RRR, no MRG. No Carotid Bruits. No peripheral edema, warm, nontender Eyes: Conjunctivae clear without exudates or hemorrhage  Neuro: Detailed Neurologic Exam  Speech:    Speech is normal; fluent and spontaneous with normal comprehension.  Cognition:    The patient is oriented to person, place, and time;     recent and remote memory intact;     language fluent;     normal attention, concentration,     fund of knowledge Cranial Nerves:    The pupils are equal, round, and reactive to light. Attempted fundoscopy couldn't visualize.  Visual fields are full to finger confrontation. Extraocular movements are intact. Trigeminal sensation is intact and the muscles of mastication are normal. The face is symmetric. The palate elevates in the midline. Hearing intact. Voice is normal. Shoulder shrug is normal. The tongue has normal motion without fasciculations.   Coordination:    No dysmetria   Gait:    Antalgic with a cane, she can get up from chair independently.   Motor Observation:    No asymmetry, no atrophy, and no involuntary movements noted. Tone:    Normal muscle tone.    Posture:    Stooped at the waste also head flexion    Strength:    Strength is V/V in the upper limbs. In her lowers poor effort right leg 3/5, left 4/5 and poor effort.      Sensation: intact to LT  Reflex Exam:  DTR's: absent AJs, could not perform right patellar due to pain, left patellar 2+, biceps 1+. .   Toes:    The toes are equivocal bilaterally.   Clonus:    Clonus is absent.    Assessment/Plan:   69 y.o. female here as requested by [name], FNP for  episode of abnormal behavior.  Past medical history chronic pain on chronic opioids, torticollis, uncontrolled diabetes (hgba1c>14) seizure 12 years ago which is medical related, posttraumatic stress disorder, occipital neuralgia, elevated liver enzymes, diabetes, chronic nausea, chronic headaches, cervicalgia, arthritis.  -  I suspect her behavior is multifactorial due to chronic pain and other medication conditions and lots of opioids to help control the pain, sleep deprivation, anxiety and depression and uncontrolled medical problems (hgba1c > 14). But we need to rule out seizures.  - I recommend imaging of the brain: She declines an MRi or a CT of the head, she starts crying and sobbing in the office, I suspect lots of psychosocial stressors, polypharmacy and heavy pain medications, depression and anxiety, chronic pain, sleep deprivation(she sometimes doesn't sleep for 2 days). But we need to rule out seizures.  - spoke to husband I will order a ct of the head and he will see if she will go. EEG routine and then prolonged EEG we can do it at home with neurovative diagostics.   - As far as her multiple pain complaints, she recently had an MRI lumbar spine and she should return to Dr. Ophelia Charter who treats her and she should follow up with her pain doctor who is Dr. Hermelinda Medicus, she likely also needs significant therapy and help to deal with her chronic illnesses.   Orders Placed This Encounter  Procedures  . CT HEAD W & WO CONTRAST  . EEG    Cc: [name], FNP,  [name], FNP  Naomie Dean, MD  [facility] [address] [place], [place] [address]  Phone [phone] Fax [phone]

## 2019-11-12 ENCOUNTER — Inpatient Hospital Stay (HOSPITAL_COMMUNITY): Admission: RE | Admit: 2019-11-12 | Payer: Medicare Other | Source: Ambulatory Visit

## 2019-11-12 ENCOUNTER — Encounter (HOSPITAL_BASED_OUTPATIENT_CLINIC_OR_DEPARTMENT_OTHER)
Admission: RE | Admit: 2019-11-12 | Discharge: 2019-11-12 | Disposition: A | Discharge status: Home / Self Care | Payer: Medicare Other | Source: Ambulatory Visit | Attending: Cardiovascular Disease | Admitting: Cardiovascular Disease

## 2019-11-12 ENCOUNTER — Encounter (HOSPITAL_COMMUNITY): Payer: Self-pay

## 2019-11-12 ENCOUNTER — Ambulatory Visit (HOSPITAL_COMMUNITY)
Admission: RE | Admit: 2019-11-12 | Discharge: 2019-11-12 | Disposition: A | Discharge status: Home / Self Care | Payer: Medicare Other | Source: Ambulatory Visit | Attending: Cardiovascular Disease | Admitting: Cardiovascular Disease

## 2019-11-12 DIAGNOSIS — R079 Chest pain, unspecified: Secondary | ICD-10-CM | POA: Diagnosis present

## 2019-11-12 LAB — NM MYOCAR MULTI W/SPECT W/WALL MOTION / EF
LV dias vol: 40 mL (ref 46–106)
LV sys vol: 18 mL
Peak HR: 103 {beats}/min
RATE: 0.39
Rest HR: 63 {beats}/min
SDS: 2
SRS: 0
SSS: 2
TID: 0.84

## 2019-11-12 MED ORDER — REGADENOSON 0.4 MG/5ML IV SOLN
INTRAVENOUS | Status: AC
  Administered 2019-11-12: 12:00:00 0.4 mg via INTRAVENOUS
  Filled 2019-11-12: qty 5

## 2019-11-12 MED ORDER — TECHNETIUM TC 99M TETROFOSMIN IV KIT
30.0000 | PACK | Freq: Once | INTRAVENOUS | Status: AC | PRN
  Administered 2019-11-12: 12:00:00 30.8 via INTRAVENOUS

## 2019-11-12 MED ORDER — TECHNETIUM TC 99M TETROFOSMIN IV KIT
10.0000 | PACK | Freq: Once | INTRAVENOUS | Status: AC | PRN
  Administered 2019-11-12: 11 via INTRAVENOUS

## 2019-11-12 MED ORDER — SODIUM CHLORIDE FLUSH 0.9 % IV SOLN
INTRAVENOUS | Status: AC
  Administered 2019-11-12: 12:00:00 10 mL via INTRAVENOUS
  Filled 2019-11-12: qty 10

## 2019-11-15 ENCOUNTER — Telehealth: Payer: Self-pay | Admitting: Neurology

## 2019-11-15 ENCOUNTER — Encounter: Payer: Self-pay | Admitting: Neurology

## 2019-11-15 ENCOUNTER — Ambulatory Visit: Payer: Medicare Other | Admitting: Family Medicine

## 2019-11-15 NOTE — Telephone Encounter (Signed)
Medicare/aarp order sent to GI. No auth they will reach out to the patient to schedule.  

## 2019-11-16 ENCOUNTER — Ambulatory Visit: Payer: Medicare Other | Admitting: Gastroenterology

## 2019-11-17 ENCOUNTER — Ambulatory Visit: Payer: Medicare Other | Admitting: Family Medicine

## 2019-11-17 ENCOUNTER — Encounter: Payer: Self-pay | Admitting: *Deleted

## 2019-11-18 ENCOUNTER — Ambulatory Visit (INDEPENDENT_AMBULATORY_CARE_PROVIDER_SITE_OTHER): Payer: Medicare Other | Admitting: Family Medicine

## 2019-11-18 ENCOUNTER — Ambulatory Visit (INDEPENDENT_AMBULATORY_CARE_PROVIDER_SITE_OTHER): Payer: Medicare Other

## 2019-11-18 ENCOUNTER — Other Ambulatory Visit: Payer: Self-pay

## 2019-11-18 ENCOUNTER — Encounter: Payer: Self-pay | Admitting: Family Medicine

## 2019-11-18 VITALS — BP 123/65 | HR 96 | Temp 97.3°F | Ht 64.0 in | Wt 138.0 lb

## 2019-11-18 DIAGNOSIS — R0789 Other chest pain: Secondary | ICD-10-CM | POA: Diagnosis not present

## 2019-11-18 DIAGNOSIS — W19XXXA Unspecified fall, initial encounter: Secondary | ICD-10-CM

## 2019-11-18 DIAGNOSIS — R0781 Pleurodynia: Secondary | ICD-10-CM

## 2019-11-18 DIAGNOSIS — E1165 Type 2 diabetes mellitus with hyperglycemia: Secondary | ICD-10-CM | POA: Diagnosis not present

## 2019-11-18 DIAGNOSIS — E1142 Type 2 diabetes mellitus with diabetic polyneuropathy: Secondary | ICD-10-CM

## 2019-11-18 MED ORDER — BLOOD GLUCOSE MONITOR KIT: PACK | 0 refills | Status: DC

## 2019-11-18 MED ORDER — GABAPENTIN 100 MG PO CAPS: 100.0000 mg | ORAL_CAPSULE | Freq: Every day | ORAL | 2 refills | Status: DC

## 2019-11-18 NOTE — Progress Notes (Signed)
Assessment & Plan:  1. Fall, initial encounter - DG Ribs Unilateral W/Chest Left; Future  2. Rib pain on left side - Patient is already on pain medication. She just wants to know if there is a fracture. - DG Ribs Unilateral W/Chest Left; Future  3. Diabetic polyneuropathy associated with type 2 diabetes mellitus ([place]) - Starting patient on low dose gabapentin at night to see if this can improve some of her symptoms. Hopefully she can decrease methadone and we can increase gabapentin if it is effective. Discussed taking at night and staying in bed to prevent falls. There is some concern about this, as she does not sleep well and is up all hours of the night. I am hopeful the gabapentin will also help her get some sleep.  - gabapentin (NEURONTIN) 100 MG capsule; Take 1 capsule (100 mg total) by mouth at bedtime.  Dispense: 30 capsule; Refill: 2  4. Uncontrolled type 2 diabetes mellitus with hyperglycemia, without long-term current use of insulin (HCC) - blood glucose meter kit and supplies KIT; Dispense based on patient and insurance preference. Use up to four times daily as directed. (FOR ICD-9 250.00, 250.01).  Dispense: 1 each; Refill: 0   Follow up plan: Return as scheduled.  [name], MSN, APRN, FNP-C [facility]  Subjective:   Patient ID: Monica [name], female    DOB: [birth year], 69 y.o.   MRN: [id number]  HPI: Monica [name] is a 69 y.o. female presenting on 11/18/2019 for Fall (Patient states that she has had 5 falls since she was last seen x 1 month ago.  Patient is c/o left rib pain.)  Patient has fallen five times since she was last seen ~1 month ago. She is c/o left rib pain today. She has seen neurology since our last visit for episodes of abnormal behavior. They are ruling out seizures. She has transitioned from a cane to a walker full time due to the falls. She is on chronic pain medications including methadone 20 mg TID and Nucynta 50 mg QD PRN  which are prescribed by Dr. Naaman Plummer. She tells me he has tried to wean her down multiple times, but each time she starts having withdrawal symptoms. She always tells me about weakness she feels in her legs but today explains that she also feels a tingling and burning sensation all the way down them. She does have uncontrolled diabetes.   Patient is in need of a new glucometer; hers is no longer working.    ROS: Negative unless specifically indicated above in HPI.   Relevant past medical history reviewed and updated as indicated.   Allergies and medications reviewed and updated.   Current Outpatient Medications:  .  atorvastatin (LIPITOR) 10 MG tablet, Take 1 tablet (10 mg total) by mouth daily., Disp: 30 tablet, Rfl: 2 .  blood glucose meter kit and supplies KIT, Dispense based on patient and insurance preference. Use up to four times daily as directed. (FOR ICD-9 250.00, 250.01)., Disp: 1 each, Rfl: 0 .  diphenhydrAMINE (BENADRYL) 25 MG tablet, Take 25 mg by mouth every 6 (six) hours as needed for allergies., Disp: , Rfl:  .  gabapentin (NEURONTIN) 100 MG capsule, Take 1 capsule (100 mg total) by mouth at bedtime., Disp: 30 capsule, Rfl: 2 .  glipiZIDE (GLUCOTROL) 5 MG tablet, Take 1 tablet (5 mg total) by mouth daily before breakfast., Disp: 30 tablet, Rfl: 3 .  metFORMIN (GLUCOPHAGE) 500 MG tablet, Take 2 tablets (1,000  mg total) by mouth 2 (two) times daily., Disp: 360 tablet, Rfl: 1 .  methadone (DOLOPHINE) 10 MG tablet, Take 2 tablets (20 mg total) by mouth 3 (three) times daily., Disp: 180 tablet, Rfl: 0 .  Misc Natural Products (OSTEO BI-FLEX ADV TRIPLE ST PO), Take by mouth., Disp: , Rfl:  .  Multiple Vitamins-Minerals (MULTI-VITAMIN GUMMIES PO), Take by mouth., Disp: , Rfl:  .  naproxen (NAPROSYN) 500 MG tablet, Take 1 tablet (500 mg total) by mouth 2 (two) times daily with a meal., Disp: 60 tablet, Rfl: 6 .  pantoprazole (PROTONIX) 40 MG tablet, Take 1 tablet (40 mg total) by mouth  daily. 30 minutes before breakfast, Disp: 30 tablet, Rfl: 3 .  promethazine (PHENERGAN) 25 MG tablet, TAKE 1 TABLET BY MOUTH EVERY 8 HOURS AS NEEDED FOR NAUSEA & VOMITING. (Patient taking differently: Take 25 mg by mouth every 8 (eight) hours as needed for nausea or vomiting. ), Disp: 60 tablet, Rfl: 3 .  tapentadol (NUCYNTA) 50 MG tablet, Take 1 tablet (50 mg total) by mouth daily as needed (migraines)., Disp: 60 tablet, Rfl: 0 .  tiZANidine (ZANAFLEX) 2 MG tablet, TAKE (1) TABLET EVERY SIX HOURS AS NEEDED FOR MUSCLE SPASMS., Disp: 60 tablet, Rfl: 1 .  topiramate (TOPAMAX) 50 MG tablet, TAKE 2 TABLETS IN THE MORNING AND 4 TABLETS AT BEDTIME., Disp: 180 tablet, Rfl: 0 .  vitamin B-12 (CYANOCOBALAMIN) 1000 MCG tablet, Take 1,000 mcg by mouth daily., Disp: , Rfl:   Allergies  Allergen Reactions  . Aspirin Anaphylaxis and Hives    REACTION: slurred speech, blurred vision  . Hydromorphone Hcl Shortness Of Breath    severe  . Vimpat [Lacosamide] Other (See Comments)    Hallucinations, out of body experience, confusion   . Capsaicin     Red rash  . Depakote [Divalproex Sodium]   . Milnacipran Hcl Other (See Comments)    dizziness  . Pristiq [Desvenlafaxine Succinate Monohydrate] Other (See Comments)    dizziness    Objective:   BP 123/65   Pulse 96   Temp (!) 97.3 F (36.3 C) (Temporal)   Ht '5\' 4"'$  (1.626 m)   Wt 138 lb (62.6 kg)   SpO2 100%   BMI 23.69 kg/m    Physical Exam Vitals reviewed.  Constitutional:      General: She is not in acute distress.    Appearance: Normal appearance. She is normal weight. She is not ill-appearing, toxic-appearing or diaphoretic.  HENT:     Head: Normocephalic and atraumatic.  Eyes:     General: No scleral icterus.       Right eye: No discharge.        Left eye: No discharge.     Conjunctiva/sclera: Conjunctivae normal.  Cardiovascular:     Rate and Rhythm: Normal rate and regular rhythm.     Heart sounds: Normal heart sounds. No murmur. No  friction rub. No gallop.   Pulmonary:     Effort: Pulmonary effort is normal. No respiratory distress.     Breath sounds: Normal breath sounds. No stridor. No wheezing, rhonchi or rales.  Musculoskeletal:        General: Normal range of motion.     Cervical back: Normal range of motion.     Comments: Pain of left ribs.  Skin:    General: Skin is warm and dry.     Capillary Refill: Capillary refill takes less than 2 seconds.     Findings: Bruising (back on left side)  present.  Neurological:     General: No focal deficit present.     Mental Status: She is alert and oriented to person, place, and time. Mental status is at baseline.     Gait: Gait abnormal (walking with walker).  Psychiatric:        Mood and Affect: Mood normal.        Behavior: Behavior normal.        Thought Content: Thought content normal.        Judgment: Judgment normal.

## 2019-11-22 ENCOUNTER — Ambulatory Visit (INDEPENDENT_AMBULATORY_CARE_PROVIDER_SITE_OTHER): Payer: Medicare Other | Admitting: Pharmacist

## 2019-11-22 ENCOUNTER — Encounter (HOSPITAL_COMMUNITY): Payer: Self-pay

## 2019-11-22 ENCOUNTER — Ambulatory Visit (HOSPITAL_COMMUNITY): Payer: Medicare Other | Attending: Cardiovascular Disease

## 2019-11-22 DIAGNOSIS — E1142 Type 2 diabetes mellitus with diabetic polyneuropathy: Secondary | ICD-10-CM | POA: Diagnosis not present

## 2019-11-23 NOTE — Progress Notes (Signed)
PharmD TeleVisit  11/22/2019 Name: Monica [name] MRN: [id number] DOB: 03/15/1951  Referred by: Loman Brooklyn, FNP Reason for referral : Peripheral Neuropathy  65 yoF referred to Pharmacy for medication management of peripheral neuropathy and other chronic condtions.  This visit was conducted via telephone due to COVID 19.  I discussed the limitations of evaluation and management by telemedicine. The patient expressed understanding and agreed to proceed.  Patient was recommended to start low dose gabapentin for new onset peripheral neuropathy.  She started gabapentin 151m qHS approximately one week ago.  Patient first noticed neuropathy onset in December 2020.  She was previously not on medication to treat this condition.  She sees pain management for chronic neck injuries and is on methadone TID, nucynta PRN and tizanidine PRN.  Gabapentin low dose nightly was started for PN as patient is on multiple CNS acting medications and has reported falls.  Patient has not fallen since using her walker.  Encouraged continued use of her walker and to try to stay in bed once gabapentin has been taken to avoid falls.  Patient also reported she slept better.    Outpatient Encounter Medications as of 11/22/2019  Medication Sig Note  . atorvastatin (LIPITOR) 10 MG tablet Take 1 tablet (10 mg total) by mouth daily.   . blood glucose meter kit and supplies KIT Dispense based on patient and insurance preference. Use up to four times daily as directed. (FOR ICD-9 250.00, 250.01).   .Marland KitchendiphenhydrAMINE (BENADRYL) 25 MG tablet Take 25 mg by mouth every 6 (six) hours as needed for allergies.   .Marland Kitchengabapentin (NEURONTIN) 100 MG capsule Take 1 capsule (100 mg total) by mouth at bedtime.   .Marland KitchenglipiZIDE (GLUCOTROL) 5 MG tablet Take 1 tablet (5 mg total) by mouth daily before breakfast.   . metFORMIN (GLUCOPHAGE) 500 MG tablet Take 2 tablets (1,000 mg total) by mouth 2 (two) times daily.   . methadone (DOLOPHINE) 10 MG  tablet Take 2 tablets (20 mg total) by mouth 3 (three) times daily.   . Misc Natural Products (OSTEO BI-FLEX ADV TRIPLE ST PO) Take by mouth.   . Multiple Vitamins-Minerals (MULTI-VITAMIN GUMMIES PO) Take by mouth.   . naproxen (NAPROSYN) 500 MG tablet Take 1 tablet (500 mg total) by mouth 2 (two) times daily with a meal.   . pantoprazole (PROTONIX) 40 MG tablet Take 1 tablet (40 mg total) by mouth daily. 30 minutes before breakfast   . promethazine (PHENERGAN) 25 MG tablet TAKE 1 TABLET BY MOUTH EVERY 8 HOURS AS NEEDED FOR NAUSEA & VOMITING. (Patient taking differently: Take 25 mg by mouth every 8 (eight) hours as needed for nausea or vomiting. )   . tapentadol (NUCYNTA) 50 MG tablet Take 1 tablet (50 mg total) by mouth daily as needed (migraines). 11/22/2019: Uses 1x per week  . tiZANidine (ZANAFLEX) 2 MG tablet TAKE (1) TABLET EVERY SIX HOURS AS NEEDED FOR MUSCLE SPASMS. (Patient taking differently: Take 2 mg by mouth every 6 (six) hours as needed for muscle spasms. )   . topiramate (TOPAMAX) 50 MG tablet TAKE 2 TABLETS IN THE MORNING AND 4 TABLETS AT BEDTIME.   . vitamin B-12 (CYANOCOBALAMIN) 1000 MCG tablet Take 1,000 mcg by mouth daily.    No facility-administered encounter medications on file as of 11/22/2019.    Assessment/PLAN: -Continue gabapentin 1020mqHS. Will f/u to determine if increased dose is needed as patient has noticed slight improvement in burning/tingling.  Cautious with multiple CNS meds. -Encouraged  patient to discuss new medication with pain management MD -Encouraged continued use of walker -Discussed plan with PCP -Follow up visit in 3 weeks for potential titration of gabapentin   Regina Eck, PharmD, BCPS Clinical Pharmacist, [place]  [place] Phone [phone]

## 2019-11-25 ENCOUNTER — Inpatient Hospital Stay: Admission: RE | Admit: 2019-11-25 | Payer: Medicare Other | Source: Ambulatory Visit

## 2019-11-25 ENCOUNTER — Other Ambulatory Visit: Payer: Self-pay | Admitting: Physical Medicine & Rehabilitation

## 2019-11-25 DIAGNOSIS — G4486 Cervicogenic headache: Secondary | ICD-10-CM

## 2019-11-25 DIAGNOSIS — M7918 Myalgia, other site: Secondary | ICD-10-CM

## 2019-11-27 ENCOUNTER — Encounter: Payer: Self-pay | Admitting: Family Medicine

## 2019-11-29 ENCOUNTER — Telehealth: Payer: Self-pay | Admitting: Family Medicine

## 2019-11-29 ENCOUNTER — Ambulatory Visit (INDEPENDENT_AMBULATORY_CARE_PROVIDER_SITE_OTHER): Payer: Medicare Other | Admitting: Neurology

## 2019-11-29 ENCOUNTER — Other Ambulatory Visit: Payer: Self-pay

## 2019-11-29 DIAGNOSIS — R4182 Altered mental status, unspecified: Secondary | ICD-10-CM

## 2019-11-29 DIAGNOSIS — R419 Unspecified symptoms and signs involving cognitive functions and awareness: Secondary | ICD-10-CM

## 2019-11-29 NOTE — Telephone Encounter (Signed)
Ok, I will forward to PCP for [facility]

## 2019-11-29 NOTE — Telephone Encounter (Signed)
Recommendations?

## 2019-11-29 NOTE — Telephone Encounter (Signed)
I will reach out to patient to discuss.  At such a low dose (or at any dose), I'm perplexed that this would be the cause of her leg pain.

## 2019-11-30 ENCOUNTER — Ambulatory Visit (INDEPENDENT_AMBULATORY_CARE_PROVIDER_SITE_OTHER): Payer: Medicare Other | Admitting: Pharmacist

## 2019-11-30 VITALS — BP 130/85

## 2019-11-30 DIAGNOSIS — E1142 Type 2 diabetes mellitus with diabetic polyneuropathy: Secondary | ICD-10-CM

## 2019-11-30 DIAGNOSIS — E1165 Type 2 diabetes mellitus with hyperglycemia: Secondary | ICD-10-CM

## 2019-11-30 NOTE — Progress Notes (Signed)
Pharmacy Clinic 11/30/2019 Name: Monica [name] MRN: [id number] DOB: [birth year]  Referred by: Loman Brooklyn, FNP Reason for referral : DM/Neuropathy  50 yoF referred to Pharmacy for medication management of peripheral neuropathy and other chronic condtions.  This visit was conducted via telephone due to COVID 19.  I discussed the limitations of evaluation and management by telemedicine. The patient expressed understanding and agreed to proceed.  Patient was recommended to start low dose gabapentin for new onset peripheral neuropathy.  She started gabapentin '100mg'$  qHS approximately 3 weeks ago.  Patient first noticed neuropathy onset in December 2020.  She was previously not on medication to treat this condition.  She sees pain management for chronic neck injuries and is on methadone TID, nucynta PRN and tizanidine PRN.  Gabapentin low dose nightly was started for PN as patient is on multiple CNS acting medications and has reported falls.  Patient has not fallen since using her walker.  Encouraged continued use of her walker and to try to stay in bed once gabapentin has been taken to avoid falls.  Patient also reported she slept better.     11/2019--patient complaint that gabapentin has now worsened her condition (increased pain and tightness) and she has self-discontinued this medication.  Gabapentin has been added to list as an  After holding dose, he pain only got "better".  She states the pain is still in both legs and she cannot even sit down.       Outpatient Encounter Medications as of 11/22/2019  Medication Sig Note  . atorvastatin (LIPITOR) 10 MG tablet Take 1 tablet (10 mg total) by mouth daily.   . blood glucose meter kit and supplies KIT Dispense based on patient and insurance preference. Use up to four times daily as directed. (FOR ICD-9 250.00, 250.01).   Marland Kitchen diphenhydrAMINE (BENADRYL) 25 MG tablet Take 25 mg by mouth every 6 (six) hours as needed for allergies.   Marland Kitchen glipiZIDE  (GLUCOTROL) 5 MG tablet Take 1 tablet (5 mg total) by mouth daily before breakfast.   . metFORMIN (GLUCOPHAGE) 500 MG tablet Take 2 tablets (1,000 mg total) by mouth 2 (two) times daily.   . methadone (DOLOPHINE) 10 MG tablet Take 2 tablets (20 mg total) by mouth 3 (three) times daily.   . Misc Natural Products (OSTEO BI-FLEX ADV TRIPLE ST PO) Take by mouth.   . Multiple Vitamins-Minerals (MULTI-VITAMIN GUMMIES PO) Take by mouth.   . naproxen (NAPROSYN) 500 MG tablet Take 1 tablet (500 mg total) by mouth 2 (two) times daily with a meal.   . pantoprazole (PROTONIX) 40 MG tablet Take 1 tablet (40 mg total) by mouth daily. 30 minutes before breakfast   . promethazine (PHENERGAN) 25 MG tablet TAKE 1 TABLET BY MOUTH EVERY 8 HOURS AS NEEDED FOR NAUSEA & VOMITING. (Patient taking differently: Take 25 mg by mouth every 8 (eight) hours as needed for nausea or vomiting. )   . tapentadol (NUCYNTA) 50 MG tablet Take 1 tablet (50 mg total) by mouth daily as needed (migraines). 11/22/2019: Uses 1x per week  . tiZANidine (ZANAFLEX) 2 MG tablet TAKE (1) TABLET EVERY SIX HOURS AS NEEDED FOR MUSCLE SPASMS. (Patient taking differently: Take 2 mg by mouth every 6 (six) hours as needed for muscle spasms. )   . topiramate (TOPAMAX) 50 MG tablet TAKE 2 TABLETS IN THE MORNING AND 4 TABLETS AT BEDTIME.   . vitamin B-12 (CYANOCOBALAMIN) 1000 MCG tablet Take 1,000 mcg by mouth daily.    No  facility-administered encounter medications on file as of 11/22/2019.    Assessment/PLAN: -Disontinue gabapentin 158m qHS. Listed as an intolerance  -Encouraged patient to discuss neuropathy options with pain management MD (Dr. SEphriam Knuckles on 12/14/19 -Consider low dose TCA at next visit, however would like for her to see pain management in the event changes are made to regimen. Patient still states her pain is never controlled -Encouraged continued use of walker -Counseled on DM, BGs up to 200 will have to add another agent if  patient continue to stay above range, high encouraged diet control -Discussed plan with PCP -Follow up visit in 4-6 weeks    JRegina Eck PharmD, BCPS Clinical Pharmacist, [place] Phone [phone]

## 2019-12-02 ENCOUNTER — Encounter: Payer: Self-pay | Admitting: Pharmacist

## 2019-12-07 ENCOUNTER — Emergency Department (HOSPITAL_COMMUNITY): Payer: Medicare Other

## 2019-12-07 ENCOUNTER — Telehealth: Payer: Self-pay | Admitting: Family Medicine

## 2019-12-07 ENCOUNTER — Encounter (HOSPITAL_COMMUNITY): Payer: Self-pay | Admitting: *Deleted

## 2019-12-07 ENCOUNTER — Ambulatory Visit: Payer: Self-pay | Admitting: Pharmacist

## 2019-12-07 ENCOUNTER — Other Ambulatory Visit: Payer: Self-pay

## 2019-12-07 ENCOUNTER — Emergency Department (HOSPITAL_COMMUNITY)
Admission: EM | Admit: 2019-12-07 | Discharge: 2019-12-08 | Disposition: A | Discharge status: Home / Self Care | Payer: Medicare Other | Attending: Emergency Medicine | Admitting: Emergency Medicine

## 2019-12-07 DIAGNOSIS — N132 Hydronephrosis with renal and ureteral calculous obstruction: Secondary | ICD-10-CM | POA: Insufficient documentation

## 2019-12-07 DIAGNOSIS — Z79899 Other long term (current) drug therapy: Secondary | ICD-10-CM | POA: Insufficient documentation

## 2019-12-07 DIAGNOSIS — R109 Unspecified abdominal pain: Secondary | ICD-10-CM | POA: Diagnosis present

## 2019-12-07 LAB — URINALYSIS, ROUTINE W REFLEX MICROSCOPIC
Bacteria, UA: NONE SEEN
Bilirubin Urine: NEGATIVE
Glucose, UA: 150 mg/dL — AB
Ketones, ur: 5 mg/dL — AB
Nitrite: NEGATIVE
Protein, ur: 30 mg/dL — AB
RBC / HPF: >50 RBC/hpf — ABNORMAL HIGH (ref 0–5)
Specific Gravity, Urine: 1.018 (ref 1.005–1.030)
pH: 6 (ref 5.0–8.0)

## 2019-12-07 LAB — CBC
HCT: 35.5 % — ABNORMAL LOW (ref 36.0–46.0)
Hemoglobin: 11.6 g/dL — ABNORMAL LOW (ref 12.0–15.0)
MCH: 29.7 pg (ref 26.0–34.0)
MCHC: 32.7 g/dL (ref 30.0–36.0)
MCV: 91 fL (ref 80.0–100.0)
Platelets: 147 10*3/uL — ABNORMAL LOW (ref 150–400)
RBC: 3.9 MIL/uL (ref 3.87–5.11)
RDW: 12.9 % (ref 11.5–15.5)
WBC: 7.4 10*3/uL (ref 4.0–10.5)
nRBC: 0 % (ref 0.0–0.2)

## 2019-12-07 LAB — COMPREHENSIVE METABOLIC PANEL
ALT: 18 U/L (ref 0–44)
AST: 13 U/L — ABNORMAL LOW (ref 15–41)
Albumin: 4.3 g/dL (ref 3.5–5.0)
Alkaline Phosphatase: 115 U/L (ref 38–126)
Anion gap: 12 (ref 5–15)
BUN: 26 mg/dL — ABNORMAL HIGH (ref 8–23)
CO2: 24 mmol/L (ref 22–32)
Calcium: 9.2 mg/dL (ref 8.9–10.3)
Chloride: 100 mmol/L (ref 98–111)
Creatinine, Ser: 0.91 mg/dL (ref 0.44–1.00)
GFR calc Af Amer: >60 mL/min (ref >60)
GFR calc non Af Amer: >60 mL/min (ref >60)
Glucose, Bld: 331 mg/dL — ABNORMAL HIGH (ref 70–99)
Potassium: 3.7 mmol/L (ref 3.5–5.1)
Sodium: 136 mmol/L (ref 135–145)
Total Bilirubin: 0.7 mg/dL (ref 0.3–1.2)
Total Protein: 7.5 g/dL (ref 6.5–8.1)

## 2019-12-07 LAB — LIPASE, BLOOD: Lipase: 33 U/L (ref 11–51)

## 2019-12-07 MED ORDER — ONDANSETRON HCL 4 MG/2ML IJ SOLN
4.0000 mg | Freq: Once | INTRAMUSCULAR | Status: AC
  Administered 2019-12-07: 4 mg via INTRAVENOUS
  Filled 2019-12-07: qty 2

## 2019-12-07 MED ORDER — ONDANSETRON HCL 4 MG PO TABS: 4.0000 mg | ORAL_TABLET | Freq: Three times a day (TID) | ORAL | 0 refills | Status: DC | PRN

## 2019-12-07 MED ORDER — SODIUM CHLORIDE 0.9% FLUSH: 3.0000 mL | Freq: Once | INTRAVENOUS | Status: DC

## 2019-12-07 MED ORDER — IOHEXOL 300 MG/ML  SOLN
100.0000 mL | Freq: Once | INTRAMUSCULAR | Status: AC | PRN
  Administered 2019-12-07: 22:00:00 100 mL via INTRAVENOUS

## 2019-12-07 MED ORDER — ONDANSETRON HCL 4 MG PO TABS: 4.0000 mg | ORAL_TABLET | Freq: Four times a day (QID) | ORAL | 0 refills | Status: DC

## 2019-12-07 NOTE — ED Provider Notes (Addendum)
[facility] Provider Note   CSN: [id number] Arrival date & time: 12/07/19  1529     History Chief Complaint  Patient presents with  . Abdominal Pain  . Leg Pain    Monica [name] is a 69 y.o. female with past medical history significant for arthritis, diabetes, GERD presents to emergency department today with chief complaint of intermittent progressively worsening abdominal pain x1 week.  She is also endorsing right leg pain x4 months.  Patient describes her abdominal pain is located in the epigastric area.  It radiates throughout her entire abdomen.  She states the pain is severe.  She has had decreased p.o. intake secondary to nausea and pain. Patient describes the leg pain as an aching sensation.  She states the pain is in her right hip and radiates down her thigh.  She states the pain is constant.  Describes as an aching sensation.  Pain is 9 out of 10 in severity.  She states she has difficulty walking because of the pain.  She does admit to seeing Dr. Crissie Figures who recommended she do strengthening exercises. She denies any recent falls. She admits to fall on 11/18/2019 and was found to have fracture of the anterolateral left ninth rib without significant displacement.  She states the pain on her left chest wall has improved. Patient sees pain management for chronic neck injuries and is on methadone 3 times daily, Nucynta as needed and tizanidine as needed.  Denies fever, chills, chest pain, shortness of breath or difficulty breathing, urinary symptoms, diarrhea, numbness or tingling in right leg, rash.  History provided by patient and spouse with additional history obtained from chart review.     Past Medical History:  Diagnosis Date  . Arthritis   . Cervical facet joint syndrome 04/22/2012  . Cervicalgia   . Chronic headaches    Dr Tessa Lerner [place] [facility]  . Chronic nausea    normal GES   . Complication of anesthesia    had problem with local  anesthesia-tried to assist Physician  . DM (diabetes mellitus) ([place])   . Elevated liver enzymes    hx of, normal 03/2010  . GERD (gastroesophageal reflux disease) 05/31/2011   History Schatzki's ring, erosive reflux esophagitis, status post last EGD and dilation 04/05/10   . Glaucoma    lens in both eyes  . Hemorrhoids 11/17/08   Colonoscopy Dr Laural Golden  . Occipital neuralgia   . PTSD (post-traumatic stress disorder)   . Renal lithiasis   . Seizure ([place])    12 yrs ago-med related  . Torticollis   . Unspecified musculoskeletal disorders and symptoms referable to neck    cervical/trapezius    Patient Active Problem List   Diagnosis Date Noted  . Quadriceps weakness 10/19/2019  . Arthritis of right sacroiliac joint 08/18/2019  . Altered bowel habits 08/03/2019  . Trochanteric bursitis, left hip 07/20/2019  . Uncontrolled type 2 diabetes mellitus with hyperglycemia, without long-term current use of insulin ([place]) 06/17/2019  . PTSD (post-traumatic stress disorder) 10/15/2016  . Pain management 10/06/2015  . Encounter for therapeutic drug level monitoring 10/02/2015  . Occipital neuralgia (Location of Primary Source of Pain) (Bilateral) (L>R) 10/02/2015  . Chronic cervical radicular pain (Left) 08/31/2015  . 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 Cervical facet arthropathy (severe at C3-4) (Bilateral) (L>R) 07/26/2015  . Cervical foraminal stenosis (C3-4) (Left) 07/26/2015  . Cervical spondylosis 06/13/2015  . Cervical facet syndrome (Location of Secondary source of  pain) (Bilateral) (L>R) 06/13/2015  . Chronic pain 06/13/2015  . Cervicogenic headache (Location of Primary Source of Pain) (Bilateral) (L>R) 06/13/2015  . Chronic neck pain (Location of Secondary source of pain) (Bilateral) (L>R) 06/13/2015  . Long term current use of opiate analgesic 06/13/2015  . Opiate use (600 MME/Day) 06/13/2015  . Opiate dependence ([place]) 06/13/2015  . Myofascial muscle pain  04/19/2013  . Dyspnea 02/10/2013  . Intractable chronic migraine without aura and with status migrainosus 04/22/2012  . Depression 04/22/2012  . Borderline personality disorder ([place]) 04/22/2012  . GERD with esophagitis 05/31/2011    Past Surgical History:  Procedure Laterality Date  . BREAST LUMPECTOMY     left  . CATARACT EXTRACTION W/PHACO Left 10/07/2016   Procedure: CATARACT EXTRACTION PHACO AND INTRAOCULAR LENS PLACEMENT (IOC);  Surgeon: Tonny Branch, MD;  Location: [facility];  Service: Ophthalmology;  Laterality: Left;  CDE: 4.98  . CATARACT EXTRACTION W/PHACO Right 10/21/2016   Procedure: CATARACT EXTRACTION PHACO AND INTRAOCULAR LENS PLACEMENT RIGHT EYE CDE - 6.24;  Surgeon: Tonny Branch, MD;  Location: [facility];  Service: Ophthalmology;  Laterality: Right;  right  . CHOLECYSTECTOMY    . COLONOSCOPY  11/17/08   ext hemorrhoids  . CRANIOTOMY     secondary TBI  . CYST EXCISION     left wrist  . 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 ESOPHAGOGASTRODUODENOSCOPY (EGD) WITH PROPOFOL N/A 08/30/2019   Procedure: ESOPHAGOGASTRODUODENOSCOPY (EGD) WITH PROPOFOL;  Surgeon: Daneil Dolin, MD;  Location: [facility];  Service: Endoscopy;  Laterality: N/A;  3:30pm - pt knows to arrive at 9:45  . FINGER SURGERY     removal cyst left pinky  . gastro empy study  03/03/10   normal  . HAND SURGERY    . MALONEY DILATION N/A 08/30/2019   Procedure: Venia Minks DILATION;  Surgeon: Daneil Dolin, MD;  Location: [facility];  Service: Endoscopy;  Laterality: N/A;  . NECK SURGERY  1998  . NEPHROLITHOTOMY  05/13/2012   Procedure: NEPHROLITHOTOMY PERCUTANEOUS;  Surgeon: Franchot Gallo, MD;  Location: [facility];  Service: Urology;  Laterality: Left;        OB History   No obstetric history on file.     Family History  Problem Relation Age of Onset  . Colon cancer Mother 60  . Heart disease Father   . Diabetes Father   .  Diabetes Paternal Grandmother   . Heart disease Paternal Grandfather   . Diverticulitis Daughter     Social History   Tobacco Use  . Smoking status: Never Smoker  . Smokeless tobacco: Never Used  Substance Use Topics  . Alcohol use: No  . Drug use: No    Home Medications Prior to Admission medications   Medication Sig Start Date End Date Taking? Authorizing Provider  atorvastatin (LIPITOR) 10 MG tablet Take 1 tablet (10 mg total) by mouth daily. 10/05/19   Loman Brooklyn, FNP  blood glucose meter kit and supplies KIT Dispense based on patient and insurance preference. Use up to four times daily as directed. (FOR ICD-9 250.00, 250.01). 11/18/19   Loman Brooklyn, FNP  diphenhydrAMINE (BENADRYL) 25 MG tablet Take 25 mg by mouth every 6 (six) hours as needed for allergies.    [provider]  glipiZIDE (GLUCOTROL) 5 MG tablet Take 1 tablet (5 mg total) by mouth daily before breakfast. 10/25/19   Loman Brooklyn, FNP  metFORMIN (GLUCOPHAGE) 500 MG tablet Take  2 tablets (1,000 mg total) by mouth 2 (two) times daily. 10/05/19   Loman Brooklyn, FNP  methadone (DOLOPHINE) 10 MG tablet Take 2 tablets (20 mg total) by mouth 3 (three) times daily. 10/13/19   Meredith Staggers, MD  Misc Natural Products (OSTEO BI-FLEX ADV TRIPLE ST PO) Take by mouth.    [provider]  Multiple Vitamins-Minerals (MULTI-VITAMIN GUMMIES PO) Take by mouth.    [provider]  naproxen (NAPROSYN) 500 MG tablet Take 1 tablet (500 mg total) by mouth 2 (two) times daily with a meal. 02/09/19   Meredith Staggers, MD  pantoprazole (PROTONIX) 40 MG tablet Take 1 tablet (40 mg total) by mouth daily. 30 minutes before breakfast 08/03/19   Annitta Needs, NP  promethazine (PHENERGAN) 25 MG tablet TAKE 1 TABLET BY MOUTH EVERY 8 HOURS AS NEEDED FOR NAUSEA & VOMITING. Patient taking differently: Take 25 mg by mouth every 8 (eight) hours as needed for nausea or vomiting.  07/15/18   Meredith Staggers, MD    tapentadol (NUCYNTA) 50 MG tablet Take 1 tablet (50 mg total) by mouth daily as needed (migraines). 10/13/19   Meredith Staggers, MD  tiZANidine (ZANAFLEX) 2 MG tablet TAKE (1) TABLET EVERY SIX HOURS AS NEEDED FOR MUSCLE SPASMS. 11/25/19   Meredith Staggers, MD  topiramate (TOPAMAX) 50 MG tablet TAKE 2 TABLETS IN THE MORNING AND 4 TABLETS AT BEDTIME. 10/19/19   Meredith Staggers, MD  vitamin B-12 (CYANOCOBALAMIN) 1000 MCG tablet Take 1,000 mcg by mouth daily.    [provider]    Allergies    Aspirin, Hydromorphone hcl, Vimpat [lacosamide], Capsaicin, Depakote [divalproex sodium], Milnacipran hcl, Pristiq [desvenlafaxine succinate monohydrate], and Gabapentin  Review of Systems   Review of Systems All other systems are reviewed and are negative for acute change except as noted in the HPI.  Physical Exam Updated Vital Signs BP 123/78 (BP Location: Right Arm)   Pulse 62   Temp 98.3 F (36.8 C) (Oral)   Resp 16   Ht '5\' 4"'$  (1.626 m)   Wt 62.6 kg   SpO2 97%   BMI 23.69 kg/m   Physical Exam Vitals and nursing note reviewed.  Constitutional:      General: She is not in acute distress.    Appearance: She is not toxic-appearing.     Comments: Chronically ill-appearing female  HENT:     Head: Normocephalic and atraumatic.     Right Ear: Tympanic membrane and external ear normal.     Left Ear: Tympanic membrane and external ear normal.     Nose: Nose normal.     Mouth/Throat:     Mouth: Mucous membranes are moist.     Pharynx: Oropharynx is clear.  Eyes:     General: No scleral icterus.       Right eye: No discharge.        Left eye: No discharge.     Extraocular Movements: Extraocular movements intact.     Conjunctiva/sclera: Conjunctivae normal.     Pupils: Pupils are equal, round, and reactive to light.  Neck:     Vascular: No JVD.  Cardiovascular:     Rate and Rhythm: Normal rate and regular rhythm.     Pulses: Normal pulses.          Radial pulses are 2+ on the  right side and 2+ on the left side.       Dorsalis pedis pulses are 2+ on the right  side and 2+ on the left side.     Heart sounds: Normal heart sounds.  Pulmonary:     Comments: Lungs clear to auscultation in all fields. Symmetric chest rise. No wheezing, rales, or rhonchi. Abdominal:     Comments: Abdomen is soft, non-distended, with tenderness to palpation of epigastric area no rigidity, no guarding. No peritoneal signs.  Musculoskeletal:        General: Normal range of motion.     Cervical back: Normal range of motion.     Right lower leg: No edema.     Left lower leg: No edema.     Comments: Decreased range of motion of right hip and knee secondary to pain.  Patient is standing throughout history and exam.  She is standing without assistance.  She ambulates with normal gait for the first 3 steps and then has noticeable limp of right lower extremity. Neurovascularly intact distally. Compartments soft above and below affected joint.    No cervical, thoracic, or lumbar spinal tenderness to palpation. No paraspinal tenderness. No step offs, crepitus or deformity palpated.   Skin:    General: Skin is warm and dry.     Capillary Refill: Capillary refill takes less than 2 seconds.     Findings: No rash.  Neurological:     Mental Status: She is oriented to person, place, and time.     GCS: GCS eye subscore is 4. GCS verbal subscore is 5. GCS motor subscore is 6.     Comments: Fluent speech, no facial droop.  Psychiatric:        Behavior: Behavior normal.     ED Results / Procedures / Treatments   Labs (all labs ordered are listed, but only abnormal results are displayed) Labs Reviewed  COMPREHENSIVE METABOLIC PANEL - Abnormal; Notable for the following components:      Result Value   Glucose, Bld 331 (*)    BUN 26 (*)    AST 13 (*)    All other components within normal limits  CBC - Abnormal; Notable for the following components:   Hemoglobin 11.6 (*)    HCT 35.5 (*)     Platelets 147 (*)    All other components within normal limits  LIPASE, BLOOD  URINALYSIS, ROUTINE W REFLEX MICROSCOPIC    EKG None  Radiology No results found.  Procedures Procedures (including critical care time)  Medications Ordered in ED Medications  sodium chloride flush (NS) 0.9 % injection 3 mL (has no administration in time range)    ED Course  I have reviewed the triage vital signs and the nursing notes.  Pertinent labs & imaging results that were available during my care of the patient were reviewed by me and considered in my medical decision making (see chart for details).    MDM Rules/Calculators/A&P                      Patient seen and examined. Patient presents awake, alert, hemodynamically stable, afebrile, non toxic.  She is chronically ill-appearing.  No tachycardia or hypoxia.  When I first entered the room patient is standing with her husband at the bedside.  I offered her to sit down but she states she is only comfortable when standing and will not lay in the bed right now.  She is standing without assistance.  She has tenderness to palpation of epigastric area.  No peritoneal signs.  Normoactive bowel sounds.  No CVA tenderness.  It is difficult  to get MSK exam on her right leg in this standing position.  I asked patient to ambulate and she is able to take 3 steps without difficulty but then has a limping gait.  There are no signs of lower extremity edema.  DP pulses 2+ bilaterally.  Right lower extremity appears neurovascularly intact.  Patient did have recent fall on 4/8/121 where she was found to have fracture of ninth rib.  There is no bruising on her abdomen or back.  She has clear and equal lung sounds with normal work of breathing. Labs were collected in triage.  Viewed results which show no leukocytosis, hemoglobin 11.6, prior to compare was x 5 months ago and is 12.7.  CMP without severe electrolyte derangement.  Glucose is elevated at 331 with normal  anion gap.  Lipase is within normal range.  UA is in process.  Given her tenderness will proceed with CT abdomen pelvis and get x-rays of her right hip and knee.  Case discussed with ED attending Dr. Rogene Houston who agrees with plan of care.  Patient care transferred to T. Triplett PA-C at the end of my shift pending imaging. Patient presentation, ED course, and plan of care discussed with review of all pertinent labs and imaging. Please see her note for further details regarding further ED course and disposition.    Portions of this note were generated with [employer]. Dictation errors may occur despite best attempts at proofreading.   Final Clinical Impression(s) / ED Diagnoses Final diagnoses:  None    Rx / DC Orders ED Discharge Orders    None       Cherre Robins, PA-C 12/07/19 2206    AlbrizzeHarley Hallmark, PA-C 12/08/19 1206    Fredia Sorrow, MD 12/11/19 1209

## 2019-12-07 NOTE — ED Triage Notes (Signed)
Pt with abd pain for a week that comes and goes, denies diarrhea or emesis, + nausea.   Right leg pain since December that started in her hip.  Has seen Dr. Ophelia Charter in [facility] and was told it was her tendon per pt and instructed to do exercises-pt states she tried and made pain worse.  Pt states unable to lift right leg.

## 2019-12-07 NOTE — Discharge Instructions (Addendum)
Your CT scan tonight shows that you have a large left-sided kidney stone.  I have spoken with the urologist in [place] and they want to see you tomorrow in the [place] office.  You may call first thing in the morning to arrange an appointment time.  Continue taking your pain medication as directed.  The Zofran is for nausea or vomiting.  Return to the emergency department if you develop any worsening symptoms.

## 2019-12-07 NOTE — ED Provider Notes (Signed)
Pt signed out to me by Emeterio Reeve, PA-C pending completion of CT scan of the abdomen/pelvis, and plain films of the right hip and right knee  Pt here with a one week hx of upper abdominal pain and left flank pain that she states related to a recent fall that resulted in a left rib fracture.  She also has right leg pain that has been present for 4 months.  Patient CT shows that she has a large left-sided ureteral stone located in the proximal left ureter with hydronephrosis.  Stone measures 8 x 11 mm.  When I spoke to patient, she also endorsed frequent urination and voiding small amounts.  She denies hematuria, fever or chills.  Her urinalysis shows a large amount of blood, small leukocytes, 11-20 white cells without bacteria or nitrates. On review of medical records, patient noted to have a large left-sided kidney stone in 2013 that required nephrolithotomy by Dr. Diona Fanti.    [facility], Dr. Louis Meckel, and discussed findings.  He agrees to see pt in the office tomorrow.  She has methadone and Nucynta at home for pain.  Patient given prescription for ondansetron for nausea or vomiting.  Patient agrees to plan.  She was given strict return precautions.    CT ABDOMEN PELVIS W CONTRAST  Result Date: 12/07/2019 CLINICAL DATA:  Progressive abdominal pain epigastric region EXAM: CT ABDOMEN AND PELVIS WITH CONTRAST TECHNIQUE: Multidetector CT imaging of the abdomen and pelvis was performed using the standard protocol following bolus administration of intravenous contrast. CONTRAST:  191mL OMNIPAQUE IOHEXOL 300 MG/ML  SOLN COMPARISON:  CT 05/04/2012 FINDINGS: Lower chest: Lung bases demonstrate no acute consolidation or effusion. Hepatobiliary: No focal liver abnormality is seen. Status post cholecystectomy. No biliary dilatation. Pancreas: Unremarkable. No pancreatic ductal dilatation or surrounding inflammatory changes. Spleen: Normal in size without focal abnormality. Adrenals/Urinary  Tract: Adrenal glands are normal. Intrarenal stones on the left. Lower pole stone measures up to 9 mm. Mild to moderate left hydronephrosis and proximal hydroureter, secondary to an 8 x 11 mm stone in the proximal left ureter about 3 cm distal to the left UPJ. Urinary bladder is unremarkable Stomach/Bowel: Stomach is within normal limits. Appendix not well seen but no right lower quadrant inflammatory process. No evidence of bowel wall thickening, distention, or inflammatory changes. Vascular/Lymphatic: Mild aortic atherosclerosis. No aneurysm. No suspicious adenopathy. Reproductive: Uterus and bilateral adnexa are unremarkable. Other: Negative for free air or free fluid. Musculoskeletal: Degenerative changes. No acute or suspicious osseous abnormality IMPRESSION: 1. Mild to moderate left hydronephrosis and proximal hydroureter, secondary to an 8 x 11 mm stone in the proximal left ureter. 2. Multiple left intrarenal stones. Electronically Signed   By: Donavan Foil M.D.   On: 12/07/2019 22:49     DG Knee Complete 4 Views Right  Result Date: 12/07/2019 CLINICAL DATA:  Right knee pain for several months, history of multiple falls, initial encounter EXAM: RIGHT KNEE - COMPLETE 4+ VIEW COMPARISON:  None. FINDINGS: Medial joint space narrowing is noted with mild osteophytic change. No acute fracture or dislocation is noted. No joint effusion is seen. Mild patellofemoral degenerative changes noted. IMPRESSION: Degenerative change without acute abnormality. Electronically Signed   By: Inez Catalina M.D.   On: 12/07/2019 22:34   DG Hip Unilat W or Wo Pelvis 2-3 Views Right  Result Date: 12/07/2019 CLINICAL DATA:  Right hip pain and fall EXAM: DG HIP (WITH OR WITHOUT PELVIS) 2-3V RIGHT COMPARISON:  None. FINDINGS: There is no  evidence of hip fracture or dislocation. There is no evidence of arthropathy or other focal bone abnormality. IMPRESSION: Negative. Electronically Signed   By: Deatra Robinson M.D.   On:  12/07/2019 22:48      Pauline Aus, PA-C 12/07/19 2359    Vanetta Mulders, MD 12/11/19 [phone]

## 2019-12-07 NOTE — Telephone Encounter (Signed)
Patient has a follow up appointment scheduled.  If symptoms worsen, go to emergency care.

## 2019-12-08 ENCOUNTER — Other Ambulatory Visit: Payer: Self-pay | Admitting: Urology

## 2019-12-09 ENCOUNTER — Other Ambulatory Visit (HOSPITAL_COMMUNITY)
Admission: RE | Admit: 2019-12-09 | Discharge: 2019-12-09 | Disposition: A | Discharge status: Home / Self Care | Payer: Medicare Other | Source: Ambulatory Visit | Attending: Urology | Admitting: Urology

## 2019-12-09 ENCOUNTER — Telehealth: Payer: Self-pay | Admitting: *Deleted

## 2019-12-09 DIAGNOSIS — Z01812 Encounter for preprocedural laboratory examination: Secondary | ICD-10-CM | POA: Diagnosis present

## 2019-12-09 DIAGNOSIS — Z20822 Contact with and (suspected) exposure to covid-19: Secondary | ICD-10-CM | POA: Insufficient documentation

## 2019-12-09 LAB — SARS CORONAVIRUS 2 (TAT 6-24 HRS): SARS Coronavirus 2: NEGATIVE

## 2019-12-09 NOTE — Telephone Encounter (Signed)
She should be able to take her methadone as she usually does. I would recommend that she hold her "post-procedure" methadone until she's awake and alert post-procedure though.

## 2019-12-09 NOTE — Telephone Encounter (Addendum)
Monica [name], [facility] left a message stating patient has an upcoming lithotripsy procedure on May 3rd, 2021.  She is reaching out to [name] for instruction on methadone.  Patient will be administered valium and benadryl pre-procedure.  On the table she will be administered Versed and fentanyl.  They are asking for instructions on what to do about her methadone.

## 2019-12-10 ENCOUNTER — Ambulatory Visit (INDEPENDENT_AMBULATORY_CARE_PROVIDER_SITE_OTHER): Payer: Medicare Other | Admitting: Family Medicine

## 2019-12-10 ENCOUNTER — Encounter: Payer: Self-pay | Admitting: Family Medicine

## 2019-12-10 DIAGNOSIS — R29898 Other symptoms and signs involving the musculoskeletal system: Secondary | ICD-10-CM

## 2019-12-10 DIAGNOSIS — E1165 Type 2 diabetes mellitus with hyperglycemia: Secondary | ICD-10-CM

## 2019-12-10 DIAGNOSIS — M2550 Pain in unspecified joint: Secondary | ICD-10-CM

## 2019-12-10 DIAGNOSIS — M79605 Pain in left leg: Secondary | ICD-10-CM

## 2019-12-10 DIAGNOSIS — M79604 Pain in right leg: Secondary | ICD-10-CM | POA: Diagnosis not present

## 2019-12-10 DIAGNOSIS — R296 Repeated falls: Secondary | ICD-10-CM | POA: Diagnosis not present

## 2019-12-10 NOTE — H&P (Addendum)
Office Visit Report     12/08/2019   --------------------------------------------------------------------------------   Monica [name]  MRN: [id number]  DOB: [birth year], 69 year old Female  SSN: [id number]   PRIMARY CARE:  Deliah Boston, NP  REFERRING:  R Roetta Sessions, MD  PROVIDER:  Marcine Matar, M.D.  TREATING:  Alfredo Martinez, M.D.  LOCATION:  [facility] [phone]     --------------------------------------------------------------------------------   CC/HPI: Patient recently seen in anti [facility] with left-sided pain. Was found to have a 9 mm nonobstructing stone lower pole left kidney. Was found to have a 8 x 11 mm stone 3 cm distal to the ureteropelvic junction on the left side with mild-to-moderate hydronephrosis. No urine culture performed. Patient takes methadone and nucynta to for chronic pain   Patient fell 6 weeks ago and was told she may have fractured her left rib and has been on Advil every day since. The pain is the left subcostal margin but it may radiate into her left back. This was less clear. The pain is steady. She has chronic nausea for weeks or months. She had a kidney stone treated by Dr. Algis Downs in 2013. She does not get bladder infections. Does not take blood thinners otherwise.   She voids every 30-60 minutes and cannot hold it for 2 hours. Gets up 2 or 3 times a night.   Has had neck and lower back surgery     ALLERGIES: Aspirin TABS Pristiq TB24 Savella TABS Zanaflex TABS    MEDICATIONS: Metformin Hcl 500 mg tablet  Atorvastatin Calcium 10 mg tablet  Glipizide  Methadone Hcl 10 mg tablet  Multivitamin Gummies  Nucynta  Ondansetron Hcl  Osteo Bi-Flex  Tizanidine Hcl  Topiramate  Vitamin B12     GU PSH: Percut Stone Removal >2cm - 2013       PSH Notes: Percutaneous Lithotomy For Stone Over 2cm., Back Surgery, Wrist Surgery, Craniotomy (Diagnostic), Hand Surgery, Cervical Vertebral Fusion, Cholecystectomy, Breast  Surgery   NON-GU PSH: Breast Surgery Procedure - 2013 Cholecystectomy (open) - 2013 Neck Spine Fusion - 2013     GU PMH: Renal calculus, Kidney stone on left side - 2014      PMH Notes:  1898-08-12 00:00:00 - Note: Normal Routine History And Physical Adult  2012-05-06 11:09:51 - Note: Seizure  2012-05-06 11:09:51 - Note: Arthritis   NON-GU PMH: Anxiety, Anxiety (Symptom) - 2014 Personal history of other diseases of the digestive system, History of esophageal reflux - 2014 Personal history of other endocrine, nutritional and metabolic disease, History of diabetes mellitus - 2014 Personal history of other mental and behavioral disorders, History of depression - 2014 Diabetes Type 2 GERD Glaucoma Seizure disorder    FAMILY HISTORY: Colon Cancer - Mother Death In The Family Father - Runs In Family Diabetes - Father Family Health Status Number - Runs In Family nephrolithiasis - Mother   SOCIAL HISTORY: Marital Status: Married Current Smoking Status: Patient has never smoked.   Tobacco Use Assessment Completed: Used Tobacco in last 30 days? Has never drank.  Does not drink caffeine. Patient's occupation is/was retired.     Notes: Caffeine Use, Marital History - Currently Married, Alcohol Use, Occupation: Retired, Never A Smoker   REVIEW OF SYSTEMS:    GU Review Female:   Patient reports frequent urination and hard to postpone urination. Patient denies burning /pain with urination, get up at night to urinate, leakage of urine, stream starts and stops, trouble starting your stream, have to strain to  urinate, and being pregnant.  Gastrointestinal (Upper):   Patient reports nausea. Patient denies vomiting and indigestion/ heartburn.  Gastrointestinal (Lower):   Patient denies diarrhea and constipation.  Constitutional:   Patient denies fever, night sweats, weight loss, and fatigue.  Skin:   Patient reports itching and skin rash/ lesion.   Eyes:   Patient denies blurred vision  and double vision.  Ears/ Nose/ Throat:   Patient denies sore throat and sinus problems.  Hematologic/Lymphatic:   Patient denies swollen glands and easy bruising.  Cardiovascular:   Patient reports leg swelling and chest pains.   Respiratory:   Patient reports shortness of breath. Patient denies cough.  Endocrine:   Patient denies excessive thirst.  Musculoskeletal:   Patient reports joint pain. Patient denies back pain.  Neurological:   Patient reports headaches and dizziness.   Psychologic:   Patient reports depression and anxiety.    Notes: left flank pain    VITAL SIGNS:      12/08/2019 11:01 AM  Weight 139 lb / 63.05 kg  Height 64 in / 162.56 cm  BP 150/95 mmHg  Pulse 79 /min  Temperature 97.8 F / 36.5 C  BMI 23.9 kg/m   MULTI-SYSTEM PHYSICAL EXAMINATION:    Constitutional: Well-nourished. No physical deformities. Normally developed. Good grooming.  Neck: Neck symmetrical, not swollen. Normal tracheal position.  Respiratory: No labored breathing, no use of accessory muscles.   Cardiovascular: Normal temperature, normal extremity pulses, no swelling, no varicosities.  Lymphatic: No enlargement of neck, axillae, groin.  Skin: No paleness, no jaundice, no cyanosis. No lesion, no ulcer, no rash.  Neurologic / Psychiatric: Oriented to time, oriented to place, oriented to person. No depression, no anxiety, no agitation.  Gastrointestinal: No mass, no tenderness, no rigidity, non obese abdomen.  Eyes: Normal conjunctivae. Normal eyelids.  Ears, Nose, Mouth, and Throat: Left ear no scars, no lesions, no masses. Right ear no scars, no lesions, no masses. Nose no scars, no lesions, no masses. Normal hearing. Normal lips.  Musculoskeletal: Normal gait and station of head and neck.     PAST DATA REVIEW: None   PROCEDURES:         KUB - [id number]  On KUB renal shadows easily noted. Stone lower pole left kidney noted. Patient has 11 mm stone in proximal left ureter between L3 and L4  easily visualized. She had a lot of bowel gas. No bony abnormalities      . Patient confirmed No Neulasta OnPro Device.          PVR Ultrasound - [id number]  Scanned Volume: 0 cc         Urinalysis w/Scope Dipstick Dipstick Cont'd Micro  Color: Yellow Bilirubin: Neg mg/dL WBC/hpf: 0 - 5/hpf  Appearance: Turbid Ketones: Neg mg/dL RBC/hpf: 20 - 51/WCH  Specific Gravity: 1.015 Blood: 3+ ery/uL Bacteria: Rare (0-9/hpf)  pH: 7.5 Protein: Trace mg/dL Cystals: Amorph Phosphates  Glucose: 3+ mg/dL Urobilinogen: 0.2 mg/dL Casts: NS (Not Seen)    Nitrites: Neg Trichomonas: Not Present    Leukocyte Esterase: Neg leu/uL Mucous: Not Present      Epithelial Cells: 0 - 5/hpf      Yeast: NS (Not Seen)      Sperm: Not Present    Notes: Microscopic not concentrated.    ASSESSMENT:      ICD-10 Details  1 GU:   Renal calculus - N20.0           Notes:   Patient recently seen in anti [facility] with left-sided pain. Was found to have a 9 mm nonobstructing stone lower pole left kidney. Was found to have a 8 x 11 mm stone 3 cm distal to the ureteropelvic junction on the left side with mild-to-moderate hydronephrosis. No urine culture performed. Patient takes methadone and nucynta to for chronic pain. She has followed I believe what for chronic pain by Neurology. She has had chronic leg pain. She had an elevated liver enzyme but last liver enzymes for the most part were reasonably normal.   Patient fell 6 weeks ago and was told she may have fractured her left rib and has been on Advil every day since. The pain is the left subcostal margin but it may radiate into her left back. This was less clear. The pain is steady. She has chronic nausea for weeks or months. She had a kidney stone treated by Dr. Algis Downs in 2013. She does not get bladder infections. Does not take blood thinners otherwise. She had actually went to the emergency room because of low leg pain on both sides below the knee and swollen ankles.   She  voids every 30-60 minutes and cannot hold it for 2 hours. Gets up 2 or 3 times a night.   When I reviewed the medical records she does have a history of chronic nausea.   Medical record reviewed. Residual 0 mL. We will get urine which I reviewed and sent for culture  On KUB renal shadows easily noted. Stone lower pole left kidney noted. Patient has 11 mm stone in proximal left ureter between L3 and L4 easily visualized. She had a lot of bowel gas. No bony abnormalities   Patient has left ureteral stone. She is on Advil. I am not convinced her left-sided pain or chronic nausea is related to the stone. Picture was drawn. Lithotripsy in full detail discussed. It may or may not help her left-sided pain or nausea and she understands this. I will check her coagulation profile as part of her routine lab work prior to the procedure. We will hold add   We talked about ESWL in detail. Pros, cons, general surgical and anesthetic risks, and other options including watchful waiting and ureteroscopy were discussed. Success and failure rates and need for further/repeat therapy were discussed. Risks were described but not limited to pain, infection, sepsis, and bleeding. The risk of renal and ureteral trauma with short and long term sequelae were discussed. The risk of injury to adjacent structures was discussed. The risk of needing a stent post-ESWL was discussed.         PLAN:           Orders Labs Urine Culture  Lab Notes: The patient will try to leave a sample before leaving the office today  X-Rays: KUB          Schedule         Document Letter(s):  Created for Patient: Clinical Summary         Next Appointment:      Next Appointment: 12/13/2019 08:45 AM    Appointment Type: Surgery     Location: [facility] [phone]    Provider: Jerilee Field, M.D.    Reason for Visit: OP NE LT ESWL      * Signed by Alfredo Martinez, M.D. on 12/09/19 at 6:50 AM (EDT)*     The  information contained in this medical record document is considered private and confidential patient information. This information can only be  used for the medical diagnosis and/or medical services that are being provided by the patient's selected caregivers. This information can only be distributed outside of the patient's care if the patient agrees and signs waivers of authorization for this information to be sent to an outside source or route.  Add; Office urine cx negative.

## 2019-12-10 NOTE — Progress Notes (Signed)
Virtual Visit via Telephone Note  I connected with Monica [name]'s husband on 12/10/19 at 9:18 AM by telephone and verified that I am speaking with the correct person using two identifiers. Monica [name] is currently located at home in the bed and her husband is also at home during this visit. The provider, Loman Brooklyn, FNP is located in their home at time of visit.  I discussed the limitations, risks, security and privacy concerns of performing an evaluation and management service by telephone and the availability of in person appointments. I also discussed with the patient that there may be a patient responsible charge related to this service. The patient expressed understanding and agreed to proceed.  Subjective: PCP: Loman Brooklyn, FNP  Chief Complaint  Patient presents with  . Leg Problem   Patient's husband would like to get her tested for Lyme disease to rule out this being a cause of her leg problems. She has had ticks pulled off in the past. Never any rashes present.   Monica [name] has been having a terrible time with her legs. She is having terrible pain in her legs which is causing multiple falls. During her last fall she fractured the left 9th rib. She previously was not using any assistive devices but over the past several months has quickly required the use of a cane and now a walker. She does have uncontrolled diabetes, which is improving. She was tried on gabapentin 100 mg QHS earlier this month which she discontinued herself as she reported it made the pain in her legs worse. She is not sleeping at night and is up all hours of the night due to the pain. She has seen orthopedic, Dr. Lorin Mercy, who did not feel this was diabetic polyneuropathy. She sees Dr. Naaman Plummer for cervical spondylosis, chronic pain, and headaches. He prescribes methadone and Nucynta.   Husband is also concerns that Monica [name]'s blood sugar has been running high the past two days. Today her blood sugar was 361.  Last night it was over 400. Upon questioning it appears he started giving her Boost 2 days ago.    ROS: Per HPI  Current Outpatient Medications:  .  atorvastatin (LIPITOR) 10 MG tablet, Take 1 tablet (10 mg total) by mouth daily., Disp: 30 tablet, Rfl: 2 .  blood glucose meter kit and supplies KIT, Dispense based on patient and insurance preference. Use up to four times daily as directed. (FOR ICD-9 250.00, 250.01). (Patient not taking: Reported on 12/07/2019), Disp: 1 each, Rfl: 0 .  glipiZIDE (GLUCOTROL) 5 MG tablet, Take 1 tablet (5 mg total) by mouth daily before breakfast., Disp: 30 tablet, Rfl: 3 .  metFORMIN (GLUCOPHAGE) 500 MG tablet, Take 2 tablets (1,000 mg total) by mouth 2 (two) times daily., Disp: 360 tablet, Rfl: 1 .  methadone (DOLOPHINE) 10 MG tablet, Take 2 tablets (20 mg total) by mouth 3 (three) times daily., Disp: 180 tablet, Rfl: 0 .  Misc Natural Products (OSTEO BI-FLEX ADV TRIPLE ST PO), Take 1 tablet by mouth daily. , Disp: , Rfl:  .  Multiple Vitamins-Minerals (MULTI-VITAMIN GUMMIES PO), Take 1 tablet by mouth daily. , Disp: , Rfl:  .  ondansetron (ZOFRAN) 4 MG tablet, Take 1 tablet (4 mg total) by mouth every 8 (eight) hours as needed for nausea or vomiting., Disp: 4 tablet, Rfl: 0 .  ondansetron (ZOFRAN) 4 MG tablet, Take 1 tablet (4 mg total) by mouth every 6 (six) hours. As needed for nausea vomiting, Disp:  10 tablet, Rfl: 0 .  tapentadol (NUCYNTA) 50 MG tablet, Take 1 tablet (50 mg total) by mouth daily as needed (migraines)., Disp: 60 tablet, Rfl: 0 .  tiZANidine (ZANAFLEX) 2 MG tablet, TAKE (1) TABLET EVERY SIX HOURS AS NEEDED FOR MUSCLE SPASMS. (Patient taking differently: Take 2 mg by mouth every 6 (six) hours as needed for muscle spasms. ), Disp: 60 tablet, Rfl: 1 .  topiramate (TOPAMAX) 50 MG tablet, TAKE 2 TABLETS IN THE MORNING AND 4 TABLETS AT BEDTIME. (Patient taking differently: Take 100-200 mg by mouth See admin instructions. TAKE 2 TABLETS IN THE MORNING AND  4 TABLETS AT BEDTIME.), Disp: 180 tablet, Rfl: 0 .  vitamin B-12 (CYANOCOBALAMIN) 1000 MCG tablet, Take 1,000 mcg by mouth at bedtime. , Disp: , Rfl:   Allergies  Allergen Reactions  . Aspirin Anaphylaxis and Hives    REACTION: slurred speech, blurred vision  . Hydromorphone Hcl Shortness Of Breath    severe  . Vimpat [Lacosamide] Other (See Comments)    Hallucinations, out of body experience, confusion   . Capsaicin     Red rash  . Depakote [Divalproex Sodium]   . Milnacipran Hcl Other (See Comments)    dizziness  . Pristiq [Desvenlafaxine Succinate Monohydrate] Other (See Comments)    dizziness  . Gabapentin     States leg pain worsened, pt d/c'd   Past Medical History:  Diagnosis Date  . Arthritis   . Cervical facet joint syndrome 04/22/2012  . Cervicalgia   . Chronic headaches    Dr Tessa Lerner [place] [facility]  . Chronic nausea    normal GES   . Complication of anesthesia    had problem with local anesthesia-tried to assist Physician  . DM (diabetes mellitus) ([place])   . Elevated liver enzymes    hx of, normal 03/2010  . GERD (gastroesophageal reflux disease) 05/31/2011   History Schatzki's ring, erosive reflux esophagitis, status post last EGD and dilation 04/05/10   . Glaucoma    lens in both eyes  . Hemorrhoids 11/17/08   Colonoscopy Dr Laural Golden  . Occipital neuralgia   . PTSD (post-traumatic stress disorder)   . Renal lithiasis   . Seizure ([place])    12 yrs ago-med related  . Torticollis   . Unspecified musculoskeletal disorders and symptoms referable to neck    cervical/trapezius    Observations/Objective: Unable to assess patient today as I was speaking with husband.   Assessment and Plan: 1. 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 Pain in both lower extremities - Ambulatory referral to Neurology  3. Weakness of both lower extremities - Ambulatory referral to Neurology  4. Multiple falls - Ambulatory  referral to Neurology  5. Uncontrolled type 2 diabetes mellitus with hyperglycemia, without long-term current use of insulin (HCC) - Advised to stop Boost as this is increasing her blood sugar. Encouraged to purchase Glucerna for diabetics. She should always try to eat prior to drinking the supplement.    Follow Up Instructions:  I discussed the assessment and treatment plan with the patient. The patient was provided an opportunity to ask questions and all were answered. The patient agreed with the plan and demonstrated an understanding of the instructions.   The patient was advised to call back or seek an in-person evaluation if the symptoms worsen or if the condition fails to improve as anticipated.  The above assessment and management plan was discussed with the patient. The patient verbalized  understanding of and has agreed to the management plan. Patient is aware to call the clinic if symptoms persist or worsen. Patient is aware when to return to the clinic for a follow-up visit. Patient educated on when it is appropriate to go to the emergency department.   Time call ended: 9:48 AM  I provided 32 minutes of non-face-to-face time during this encounter.  [name], MSN, APRN, FNP-C [facility] 12/10/19

## 2019-12-10 NOTE — Procedures (Signed)
   HISTORY: 69 year old female with history of episode of an abnormal behavior  TECHNIQUE:  This is a routine 16 channel EEG recording with one channel devoted to a limited EKG recording.  It was performed during wakefulness, drowsiness and asleep.  Hyperventilation and photic stimulation were performed as activating procedures.  There are minimum muscle and movement artifact noted.  Upon maximum arousal, posterior dominant waking rhythm consistent of mildly dysrhythmic mixed alpha and theta range activity, activities are symmetric over the bilateral posterior derivations and attenuated with eye opening.  There are frequent protrusion of short lasting mild generalized slowing wave high amplitude, delta, theta range, lasting 2 to 5 seconds.  Hyperventilation produced mild/moderate buildup with higher amplitude and the slower activities noted.  Photic stimulation did not alter the tracing.  During EEG recording, patient developed drowsiness and no deeper stage of sleep was achieved  During EEG recording, there was no epileptiform discharge noted.  EKG demonstrate sinus rhythm, with normal sinus rhythm  CONCLUSION: This is a mild abnormal EEG, there is evidence of mild background slowing, indicating mild bihemispheric malfunction.  Common etiology are metabolic toxic.  Levert Feinstein, M.D. Ph.D.  [facility] [address] [place], [place] [address] Phone: [phone] Fax:      [phone]

## 2019-12-10 NOTE — Progress Notes (Signed)
Talked with pt's husband. Pt lying down. Instructions given to husband for wife. NPO after MN. Arrival time 67. May take Methadone am of procedure

## 2019-12-13 ENCOUNTER — Encounter (HOSPITAL_BASED_OUTPATIENT_CLINIC_OR_DEPARTMENT_OTHER): Payer: Self-pay | Admitting: Urology

## 2019-12-13 ENCOUNTER — Encounter (HOSPITAL_BASED_OUTPATIENT_CLINIC_OR_DEPARTMENT_OTHER)
Admission: RE | Disposition: A | Discharge status: Home / Self Care | Payer: Self-pay | Source: Home / Self Care | Attending: Urology

## 2019-12-13 ENCOUNTER — Other Ambulatory Visit: Payer: Self-pay

## 2019-12-13 ENCOUNTER — Ambulatory Visit (HOSPITAL_COMMUNITY): Payer: Medicare Other

## 2019-12-13 ENCOUNTER — Ambulatory Visit (HOSPITAL_BASED_OUTPATIENT_CLINIC_OR_DEPARTMENT_OTHER)
Admission: RE | Admit: 2019-12-13 | Discharge: 2019-12-13 | Disposition: A | Discharge status: Home / Self Care | Payer: Medicare Other | Attending: Urology | Admitting: Urology

## 2019-12-13 DIAGNOSIS — Z9049 Acquired absence of other specified parts of digestive tract: Secondary | ICD-10-CM | POA: Insufficient documentation

## 2019-12-13 DIAGNOSIS — Z841 Family history of disorders of kidney and ureter: Secondary | ICD-10-CM | POA: Insufficient documentation

## 2019-12-13 DIAGNOSIS — K219 Gastro-esophageal reflux disease without esophagitis: Secondary | ICD-10-CM | POA: Insufficient documentation

## 2019-12-13 DIAGNOSIS — F329 Major depressive disorder, single episode, unspecified: Secondary | ICD-10-CM | POA: Diagnosis not present

## 2019-12-13 DIAGNOSIS — Z833 Family history of diabetes mellitus: Secondary | ICD-10-CM | POA: Diagnosis not present

## 2019-12-13 DIAGNOSIS — Z7984 Long term (current) use of oral hypoglycemic drugs: Secondary | ICD-10-CM | POA: Insufficient documentation

## 2019-12-13 DIAGNOSIS — N132 Hydronephrosis with renal and ureteral calculous obstruction: Secondary | ICD-10-CM | POA: Insufficient documentation

## 2019-12-13 DIAGNOSIS — G8929 Other chronic pain: Secondary | ICD-10-CM | POA: Insufficient documentation

## 2019-12-13 DIAGNOSIS — Z886 Allergy status to analgesic agent status: Secondary | ICD-10-CM | POA: Diagnosis not present

## 2019-12-13 DIAGNOSIS — Z79899 Other long term (current) drug therapy: Secondary | ICD-10-CM | POA: Insufficient documentation

## 2019-12-13 DIAGNOSIS — Z888 Allergy status to other drugs, medicaments and biological substances status: Secondary | ICD-10-CM | POA: Diagnosis not present

## 2019-12-13 DIAGNOSIS — M1990 Unspecified osteoarthritis, unspecified site: Secondary | ICD-10-CM | POA: Diagnosis not present

## 2019-12-13 DIAGNOSIS — Z981 Arthrodesis status: Secondary | ICD-10-CM | POA: Insufficient documentation

## 2019-12-13 DIAGNOSIS — E119 Type 2 diabetes mellitus without complications: Secondary | ICD-10-CM | POA: Insufficient documentation

## 2019-12-13 DIAGNOSIS — N201 Calculus of ureter: Secondary | ICD-10-CM

## 2019-12-13 HISTORY — PX: EXTRACORPOREAL SHOCK WAVE LITHOTRIPSY: SHX1557

## 2019-12-13 LAB — GLUCOSE, CAPILLARY: Glucose-Capillary: 280 mg/dL — ABNORMAL HIGH (ref 70–99)

## 2019-12-13 LAB — APTT: aPTT: 25 seconds (ref 24–36)

## 2019-12-13 LAB — PROTIME-INR
INR: 1.1 (ref 0.8–1.2)
Prothrombin Time: 13.9 seconds (ref 11.4–15.2)

## 2019-12-13 SURGERY — LITHOTRIPSY, ESWL
Anesthesia: LOCAL | Laterality: Left

## 2019-12-13 MED ORDER — SODIUM CHLORIDE 0.9 % IV SOLN: INTRAVENOUS | Status: DC

## 2019-12-13 MED ORDER — DIPHENHYDRAMINE HCL 25 MG PO CAPS
ORAL_CAPSULE | ORAL | Status: AC
  Filled 2019-12-13: qty 1

## 2019-12-13 MED ORDER — DIAZEPAM 5 MG PO TABS
ORAL_TABLET | ORAL | Status: AC
  Filled 2019-12-13: qty 2

## 2019-12-13 MED ORDER — DIPHENHYDRAMINE HCL 25 MG PO CAPS
25.0000 mg | ORAL_CAPSULE | ORAL | Status: AC
  Administered 2019-12-13: 08:00:00 25 mg via ORAL

## 2019-12-13 MED ORDER — DIAZEPAM 5 MG PO TABS
10.0000 mg | ORAL_TABLET | ORAL | Status: AC
  Administered 2019-12-13: 10 mg via ORAL

## 2019-12-13 NOTE — Discharge Instructions (Signed)
Post Anesthesia Home Care Instructions  Activity: Get plenty of rest for the remainder of the day. A responsible adult should stay with you for 24 hours following the procedure.  For the next 24 hours, DO NOT: -Drive a car -Operate machinery -Drink alcoholic beverages -Take any medication unless instructed by your physician -Make any legal decisions or sign important papers.  Meals: Start with liquid foods such as gelatin or soup. Progress to regular foods as tolerated. Avoid greasy, spicy, heavy foods. If nausea and/or vomiting occur, drink only clear liquids until the nausea and/or vomiting subsides. Call your physician if vomiting continues.  Special Instructions/Symptoms: Your throat may feel dry or sore from the anesthesia or the breathing tube placed in your throat during surgery. If this causes discomfort, gargle with warm salt water. The discomfort should disappear within 24 hours.  If you had a scopolamine patch placed behind your ear for the management of post- operative nausea and/or vomiting:  1. The medication in the patch is effective for 72 hours, after which it should be removed.  Wrap patch in a tissue and discard in the trash. Wash hands thoroughly with soap and water. 2. You may remove the patch earlier than 72 hours if you experience unpleasant side effects which may include dry mouth, dizziness or visual disturbances. 3. Avoid touching the patch. Wash your hands with soap and water after contact with the patch.   Lithotripsy, Care After This sheet gives you information about how to care for yourself after your procedure. Your health care provider may also give you more specific instructions. If you have problems or questions, contact your health care provider. What can I expect after the procedure? After the procedure, it is common to have:  Some blood in your urine. This should only last for a few days.  Soreness in your back, sides, or upper abdomen for a few  days.  Blotches or bruises on your back where the pressure wave entered the skin.  Pain, discomfort, or nausea when pieces (fragments) of the kidney stone move through the tube that carries urine from the kidney to the bladder (ureter). Stone fragments may pass soon after the procedure, but they may continue to pass for up to 4-8 weeks. ? If you have severe pain or nausea, contact your health care provider. This may be caused by a large stone that was not broken up, and this may mean that you need more treatment.  Some pain or discomfort during urination.  Some pain or discomfort in the lower abdomen or (in men) at the base of the penis. Follow these instructions at home: Medicines  Take over-the-counter and prescription medicines only as told by your health care provider.  If you were prescribed an antibiotic medicine, take it as told by your health care provider. Do not stop taking the antibiotic even if you start to feel better.  Do not drive for 24 hours if you were given a medicine to help you relax (sedative).  Do not drive or use heavy machinery while taking prescription pain medicine. Eating and drinking      Drink enough water and fluids to keep your urine clear or pale yellow. This helps any remaining pieces of the stone to pass. It can also help prevent new stones from forming.  Eat plenty of fresh fruits and vegetables.  Follow instructions from your health care provider about eating and drinking restrictions. You may be instructed: ? To reduce how much salt (sodium) you eat   or drink. Check ingredients and nutrition facts on packaged foods and beverages. ? To reduce how much meat you eat.  Eat the recommended amount of calcium for your age and gender. Ask your health care provider how much calcium you should have. General instructions  Get plenty of rest.  Most people can resume normal activities 1-2 days after the procedure. Ask your health care provider what  activities are safe for you.  Your health care provider may direct you to lie in a certain position (postural drainage) and tap firmly (percuss) over your kidney area to help stone fragments pass. Follow instructions as told by your health care provider.  If directed, strain all urine through the strainer that was provided by your health care provider. ? Keep all fragments for your health care provider to see. Any stones that are found may be sent to a medical lab for examination. The stone may be as small as a grain of salt.  Keep all follow-up visits as told by your health care provider. This is important. Contact a health care provider if:  You have pain that is severe or does not get better with medicine.  You have nausea that is severe or does not go away.  You have blood in your urine longer than your health care provider told you to expect.  You have more blood in your urine.  You have pain during urination that does not go away.  You urinate more frequently than usual and this does not go away.  You develop a rash or any other possible signs of an allergic reaction. Get help right away if:  You have severe pain in your back, sides, or upper abdomen.  You have severe pain while urinating.  Your urine is very dark red.  You have blood in your stool (feces).  You cannot pass any urine at all.  You feel a strong urge to urinate after emptying your bladder.  You have a fever or chills.  You develop shortness of breath, difficulty breathing, or chest pain.  You have severe nausea that leads to persistent vomiting.  You faint. Summary  After this procedure, it is common to have some pain, discomfort, or nausea when pieces (fragments) of the kidney stone move through the tube that carries urine from the kidney to the bladder (ureter). If this pain or nausea is severe, however, you should contact your health care provider.  Most people can resume normal activities 1-2  days after the procedure. Ask your health care provider what activities are safe for you.  Drink enough water and fluids to keep your urine clear or pale yellow. This helps any remaining pieces of the stone to pass, and it can help prevent new stones from forming.  If directed, strain your urine and keep all fragments for your health care provider to see. Fragments or stones may be as small as a grain of salt.  Get help right away if you have severe pain in your back, sides, or upper abdomen or have severe pain while urinating. This information is not intended to replace advice given to you by your health care provider. Make sure you discuss any questions you have with your health care provider. Document Revised: 11/09/2018 Document Reviewed: 06/19/2016 Elsevier Patient Education  2020 Elsevier Inc.  

## 2019-12-13 NOTE — Interval H&P Note (Signed)
History and Physical Interval Note:  12/13/2019 8:40 AM  Monica [name]  has presented today for surgery, with the diagnosis of LEFT URETERAL STONE.  The various methods of treatment have been discussed with the patient and family. After consideration of risks, benefits and other options for treatment, the patient has consented to  Procedure(s): EXTRACORPOREAL SHOCK WAVE LITHOTRIPSY (ESWL) (Left) as a surgical intervention.  The patient's history has been reviewed, patient examined, no change in status, stable for surgery.  I have reviewed the patient's chart and labs. Stone right at pelvic inlet hidden on the KUB but plainly visible on the flouro. Pt with urgency and left flank pain. Has not seen stone pass. No fever or dysuria. Questions were answered to the patient's satisfaction.   She elects to proceed. Her BS was 280 but 305 a month ago and pt feels well.    Jerilee Field

## 2019-12-13 NOTE — Op Note (Signed)
Left 8 mm mid ureteral stone   Left ESWL  Findings - she did well. Stone faded well but she may need a staged procedure if stone/fragments fail to pass.

## 2019-12-14 ENCOUNTER — Telehealth: Payer: Self-pay | Admitting: *Deleted

## 2019-12-14 NOTE — Telephone Encounter (Signed)
-----   Message from Anson Fret, MD sent at 12/12/2019  5:04 PM EDT ----- EEG normal. Would like to get [facility] to do a 48 hour eeg if they are willing, as we discussed let m eknow and I can order thanks

## 2019-12-14 NOTE — Telephone Encounter (Signed)
Spoke with pt's husband [name] (on [place]) and we discussed per Dr. Lucia Gaskins, EEG normal. He is amenable to proceeding with 48 hour EEG. He said the pt is currently dealing with a kidney stone issue for which she had surgery, so he would like to wait about 2 weeks. Pt hasn't been able to get her CT head either because pt has been dealing with other issues. I let him know [facility] would be calling them to schedule at a time that works for pt. He feels the patient is suffering from neuropathy, has Diabetes, and said our office will be getting a referral. He said the pt has been losing functionality of legs, progressive for 3 months. Went to ED for this and they found the kidney stones. He said noone will treat her pain except for pain management. Pt has an appt with Dr. Hermelinda Medicus tomorrow. He verbalized appreciation for the call. I provided GI's number to call to schedule CT head. His questions were answered during the call.

## 2019-12-15 ENCOUNTER — Encounter
Payer: Worker's Compensation | Attending: Physical Medicine & Rehabilitation | Admitting: Physical Medicine & Rehabilitation

## 2019-12-15 ENCOUNTER — Encounter: Payer: Self-pay | Admitting: Physical Medicine & Rehabilitation

## 2019-12-15 ENCOUNTER — Other Ambulatory Visit: Payer: Self-pay

## 2019-12-15 ENCOUNTER — Other Ambulatory Visit: Payer: Self-pay | Admitting: Neurology

## 2019-12-15 ENCOUNTER — Telehealth: Payer: Self-pay | Admitting: Neurology

## 2019-12-15 VITALS — BP 140/85 | HR 111 | Temp 97.3°F | Ht 64.0 in | Wt 138.0 lb

## 2019-12-15 DIAGNOSIS — Z5181 Encounter for therapeutic drug level monitoring: Secondary | ICD-10-CM

## 2019-12-15 DIAGNOSIS — R29898 Other symptoms and signs involving the musculoskeletal system: Secondary | ICD-10-CM

## 2019-12-15 DIAGNOSIS — R531 Weakness: Secondary | ICD-10-CM | POA: Insufficient documentation

## 2019-12-15 DIAGNOSIS — R519 Headache, unspecified: Secondary | ICD-10-CM | POA: Insufficient documentation

## 2019-12-15 DIAGNOSIS — G894 Chronic pain syndrome: Secondary | ICD-10-CM | POA: Diagnosis not present

## 2019-12-15 DIAGNOSIS — M47812 Spondylosis without myelopathy or radiculopathy, cervical region: Secondary | ICD-10-CM | POA: Insufficient documentation

## 2019-12-15 DIAGNOSIS — Z79899 Other long term (current) drug therapy: Secondary | ICD-10-CM

## 2019-12-15 DIAGNOSIS — G43711 Chronic migraine without aura, intractable, with status migrainosus: Secondary | ICD-10-CM | POA: Insufficient documentation

## 2019-12-15 DIAGNOSIS — G4486 Cervicogenic headache: Secondary | ICD-10-CM

## 2019-12-15 MED ORDER — METHADONE HCL 10 MG PO TABS: 20.0000 mg | ORAL_TABLET | Freq: Three times a day (TID) | ORAL | 0 refills | Status: DC

## 2019-12-15 MED ORDER — NORTRIPTYLINE HCL 25 MG PO CAPS: 25.0000 mg | ORAL_CAPSULE | Freq: Every day | ORAL | 2 refills | Status: DC

## 2019-12-15 NOTE — Progress Notes (Signed)
Subjective:    Patient ID: Monica [name], female    DOB: 1950-09-06, 69 y.o.   MRN: [id number]  HPI   Monica [name] is here in follow-up of her chronic pain syndrome.  Since I last saw her in early March she has had further problems with pain in her back and lower extremities.  Lumbar MRI revealed some foraminal stenosis but no cause for bilateral leg pain and sensory loss.  She is also had issues with kidney stones and had recent lithotripsy performed by urology.  Referral was made to neurology for assessment of her lower extremity weakness.  An EEG was read as normal but now a 48-hour EEG has been scheduled.  Also plan is for a CT of the head and EMG/NCS?  Husband is with Monica [name] today and has been needing to provide increased assistance at home due to her increasing weakness and pain.  He is using a wheelchair for mobility at present as she is really struggling moving her right leg at all.  He had questions about her methadone and interest in getting to taper this.  I discussed with him that states she and I had gone through this multiple times in hope to begin doing this but that states she had been resistant previously.  Husband denies any bowel incontinence or constipation.  Appetite has been less due to her overall sedation and pain.  She still struggles with sleep at night due to pain but then remains more lethargic during the day.  She has lost about a 10 pound since she was last here.  Bladder habits have been affected by the kidney stone above.       Pain Inventory Average Pain 5 Pain Right Now 5 My pain is constant  In the last 24 hours, has pain interfered with the following? General activity 4 Relation with others 3 Enjoyment of life 3 What TIME of day is your pain at its worst? all Sleep (in general) Poor  Pain is worse with: walking, bending, sitting, inactivity, standing and some activites Pain improves with: medication Relief from Meds: 3  Mobility walk with assistance  use a walker ability to climb steps?  yes do you drive?  no use a wheelchair  Function disabled: date disabled .  Neuro/Psych weakness numbness trouble walking anxiety  Prior Studies Any changes since last visit?  no  Physicians involved in your care Any changes since last visit?  no   Family History  Problem Relation Age of Onset  . Colon cancer Mother 2  . Heart disease Father   . Diabetes Father   . Diabetes Paternal Grandmother   . Heart disease Paternal Grandfather   . Diverticulitis Daughter    Social History   Socioeconomic History  . Marital status: Married    Spouse name: Not on file  . Number of children: 3  . Years of education: Not on file  . Highest education level: Not on file  Occupational History  . Occupation: disabled    Associate Professor: UNEMPLOYED  Tobacco Use  . Smoking status: Never Smoker  . Smokeless tobacco: Never Used  Substance and Sexual Activity  . Alcohol use: No  . Drug use: No  . Sexual activity: Yes    Birth control/protection: Post-menopausal  Other Topics Concern  . Not on file  Social History Narrative   Lives at home with husband    Right handed   Caffeine: none   Social Determinants of Health   Financial Resource Strain:   .  Difficulty of Paying Living Expenses:   Food Insecurity:   . Worried About Programme researcher, broadcasting/film/video in the Last Year:   . Barista in the Last Year:   Transportation Needs:   . Freight forwarder (Medical):   Marland Kitchen Lack of Transportation (Non-Medical):   Physical Activity:   . Days of Exercise per Week:   . Minutes of Exercise per Session:   Stress:   . Feeling of Stress :   Social Connections:   . Frequency of Communication with Friends and Family:   . Frequency of Social Gatherings with Friends and Family:   . Attends Religious Services:   . Active Member of Clubs or Organizations:   . Attends Banker Meetings:   Marland Kitchen Marital Status:    Past Surgical History:  Procedure  Laterality Date  . BREAST LUMPECTOMY     left  . CATARACT EXTRACTION W/PHACO Left 10/07/2016   Procedure: CATARACT EXTRACTION PHACO AND INTRAOCULAR LENS PLACEMENT (IOC);  Surgeon: Gemma Payor, MD;  Location: [facility];  Service: Ophthalmology;  Laterality: Left;  CDE: 4.98  . CATARACT EXTRACTION W/PHACO Right 10/21/2016   Procedure: CATARACT EXTRACTION PHACO AND INTRAOCULAR LENS PLACEMENT RIGHT EYE CDE - 6.24;  Surgeon: Gemma Payor, MD;  Location: [facility];  Service: Ophthalmology;  Laterality: Right;  right  . CHOLECYSTECTOMY    . COLONOSCOPY  11/17/08   ext hemorrhoids  . CRANIOTOMY     secondary TBI  . CYST EXCISION     left wrist  . ESOPHAGOGASTRODUODENOSCOPY  04/05/10   [name] (18F),erosive reflux esophagitis, small hiatal hernia,  . ESOPHAGOGASTRODUODENOSCOPY (EGD) WITH PROPOFOL N/A 08/30/2019   Procedure: ESOPHAGOGASTRODUODENOSCOPY (EGD) WITH PROPOFOL;  Surgeon: Corbin Ade, MD;  Location: [facility];  Service: Endoscopy;  Laterality: N/A;  3:30pm - pt knows to arrive at 9:45  . EXTRACORPOREAL SHOCK WAVE LITHOTRIPSY Left 12/13/2019   Procedure: EXTRACORPOREAL SHOCK WAVE LITHOTRIPSY (ESWL);  Surgeon: Jerilee Field, MD;  Location[facility];  Service: Urology;  Laterality: Left;  . FINGER SURGERY     removal cyst left pinky  . gastro empy study  03/03/10   normal  . HAND SURGERY    . MALONEY DILATION N/A 08/30/2019   Procedure: Elease Hashimoto DILATION;  Surgeon: Corbin Ade, MD;  Location: [facility];  Service: Endoscopy;  Laterality: N/A;  . NECK SURGERY  1998  . NEPHROLITHOTOMY  05/13/2012   Procedure: NEPHROLITHOTOMY PERCUTANEOUS;  Surgeon: Marcine Matar, MD;  Location: [facility];  Service: Urology;  Laterality: Left;      Past Medical History:  Diagnosis Date  . Arthritis   . Cervical facet joint syndrome 04/22/2012  . Cervicalgia   . Chronic headaches    Dr Hermelinda Medicus [place] [facility]  . Chronic nausea    normal GES   . Complication of  anesthesia    had problem with local anesthesia-tried to assist Physician  . DM (diabetes mellitus) (HCC)   . Elevated liver enzymes    hx of, normal 03/2010  . GERD (gastroesophageal reflux disease) 05/31/2011   History Schatzki's ring, erosive reflux esophagitis, status post last EGD and dilation 04/05/10   . Glaucoma    lens in both eyes  . Hemorrhoids 11/17/08   Colonoscopy Dr Karilyn Cota  . Occipital neuralgia   . PTSD (post-traumatic stress disorder)   . Renal lithiasis   . 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 Torticollis   . Unspecified musculoskeletal  disorders and symptoms referable to neck    cervical/trapezius   BP 140/85   Pulse (!) 111   Temp (!) 97.3 F (36.3 C)   Ht 5\' 4"  (1.626 m)   Wt 138 lb (62.6 kg)   SpO2 94%   BMI 23.69 kg/m   Opioid Risk Score:   Fall Risk Score:  `1  Depression screen PHQ 2/9  Depression screen [facility] 2/9 10/29/2019 10/22/2019 07/16/2019 06/29/2019 12/16/2018 10/19/2018 04/22/2018  Decreased Interest 0 0 0 1 1 1  0  Down, Depressed, Hopeless 0 0 0 1 1 1  0  PHQ - 2 Score 0 0 0 2 2 2  0  Altered sleeping - - - 3 - - -  Tired, decreased energy - - - 2 - - -  Change in appetite - - - 3 - - -  Feeling bad or failure about yourself  - - - 1 - - -  Trouble concentrating - - - 3 - - -  Moving slowly or fidgety/restless - - - 2 - - -  Suicidal thoughts - - - 0 - - -  PHQ-9 Score - - - 16 - - -  Difficult doing work/chores - - - Not difficult at all - - -  Some recent data might be hidden    Review of Systems  Constitutional: Negative.   HENT: Negative.   Eyes: Negative.   Respiratory: Negative.   Cardiovascular: Negative.   Gastrointestinal: Negative.   Endocrine: Negative.   Genitourinary: Negative.   Musculoskeletal: Positive for arthralgias, back pain and neck pain.  Skin: Negative.   Allergic/Immunologic: Negative.   Neurological: Positive for weakness, numbness and headaches.  Hematological: Negative.   Psychiatric/Behavioral: The  patient is nervous/anxious.        Objective:   Physical Exam Gen: Lethargic appearing, sitting in wheelchair.  Has been with her HEENT: oral mucosa pink and moist, NCAT Cardio: Reg rate Chest: normal effort, normal rate of breathing Abd: soft, non-distended Ext: no edema Skin: intact Neuro: 3 to 3+ out of 5 strength in the bilateral upper extremities.  Right lower extremity is trace to 1 out of 5 with questionable effort.  Left lower extremity grossly 2+ to 3+ out of 5 proximal distal.  Senses pain and light touch in all 4 limbs.  Reflexes are 1+ in the upper extremities and absent in the lower extremities.  Patient's follows basic commands but is very lethargic.  Struggles keeping eyes open.  Oriented to person place and reason she is here.  Can answer basic biographical questions. Musculoskeletal: Psych: flat, lethargic       Assessment & Plan:  Assessment:  1. 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  2. Posttraumatic stress syndrome.  3. Greater occipital neuralgia (bilateral) confirmed by response to recent nerve block  4. 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 Increased LE weakness, pain. Lumbar MRI without source for such diffuse symptoms. Arms also weak on exam.      PLAN:  1. Begin taper of methadone rx. RF today. #180 10mg  tablets  Will try to decrease by 15mg  daily by end of the next 4 weeks.  Husband will call me at that point to discuss further taper of methadone.          We will continue the controlled substance monitoring program, this consists of regular clinic visits, examinations, routine drug screening, pill counts as well as use of  [place] Controlled Substance Reporting System. [place].   - no UDS today d/t kidney stones and debilitated state             - Nucynta 50 mg every 12 hours as needed #60 No RF today    2. History of  recent greater occipital nerve RF with transient effects.  3. Continue topamax.               4. 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  5. Increased weakness in all 4's, lower extremity pain.   -needs EMG/NCS. Husband tells me that's beeing scheduled  -with arms being weak also, needs MRI of neck/head but patient refuses MRI. Will order CT of head and neck without contrast  -neuro follow up, 48hr EEG to be scheduled 6.   zanaflex 2mg  q6 prn for spasm. generally uses in evenings. 7. Add nortriptyline 25mg  qhs for sleep, leg/neuro pain.    25 minutes of face to face patient care time were spent during this visit. All questions were encouraged and answered.  Follow up with me in 2 mos .

## 2019-12-15 NOTE — Patient Instructions (Signed)
METHADONE:  20-15-20MG  FOR ONE WEEK  THEN 15-15-20MG  FOR ONE WEEK  THEN 15MG  THREE X DAILY  OBSERVE THE NEXT WEEK.   CALL ME BEFORE THE NEXT PRESCRIPTION IS DUE AND WE CAN DISCUSS FURTHER TAPER.

## 2019-12-15 NOTE — Telephone Encounter (Signed)
Patient has developed leg and arm weakness, I spoke with [name] who suggested an emg/ncs. I am going to place the order would someone please call to get her scheduled? Not sure which one of you would do this, thank you !!!

## 2019-12-15 NOTE — Telephone Encounter (Signed)
48 hour EEG referral form signed by Dr. Lucia Gaskins. I faxed to [facility] along with info requested on referral form: office note, EEG result, pt medications, demographic/insurance information. Received a receipt of confirmation.

## 2019-12-16 NOTE — Telephone Encounter (Signed)
We received a notice from [employer]. Referral has been received and is being processed. Our office will be notified when the patient has been successfully scheduled.

## 2019-12-29 NOTE — Progress Notes (Deleted)
Leg and arm weakness.

## 2019-12-30 ENCOUNTER — Inpatient Hospital Stay (HOSPITAL_COMMUNITY): Payer: Medicare Other

## 2019-12-30 ENCOUNTER — Telehealth: Payer: Self-pay | Admitting: Neurology

## 2019-12-30 ENCOUNTER — Emergency Department (HOSPITAL_COMMUNITY): Payer: Medicare Other

## 2019-12-30 ENCOUNTER — Encounter (HOSPITAL_COMMUNITY): Payer: Self-pay | Admitting: *Deleted

## 2019-12-30 ENCOUNTER — Encounter: Payer: Self-pay | Admitting: Neurology

## 2019-12-30 ENCOUNTER — Other Ambulatory Visit: Payer: Self-pay

## 2019-12-30 ENCOUNTER — Inpatient Hospital Stay: Payer: Medicare Other | Admitting: Neurology

## 2019-12-30 ENCOUNTER — Inpatient Hospital Stay (HOSPITAL_COMMUNITY)
Admission: EM | Admit: 2019-12-30 | Discharge: 2020-01-03 | DRG: 682 | Disposition: A | Discharge status: Home Health | Payer: Medicare Other | Attending: Internal Medicine | Admitting: Internal Medicine

## 2019-12-30 DIAGNOSIS — Z7984 Long term (current) use of oral hypoglycemic drugs: Secondary | ICD-10-CM

## 2019-12-30 DIAGNOSIS — Z20822 Contact with and (suspected) exposure to covid-19: Secondary | ICD-10-CM | POA: Diagnosis present

## 2019-12-30 DIAGNOSIS — R5381 Other malaise: Secondary | ICD-10-CM | POA: Diagnosis present

## 2019-12-30 DIAGNOSIS — M5481 Occipital neuralgia: Secondary | ICD-10-CM | POA: Diagnosis present

## 2019-12-30 DIAGNOSIS — E872 Acidosis, unspecified: Secondary | ICD-10-CM

## 2019-12-30 DIAGNOSIS — R000 Tachycardia, unspecified: Secondary | ICD-10-CM | POA: Diagnosis not present

## 2019-12-30 DIAGNOSIS — K2100 Gastro-esophageal reflux disease with esophagitis, without bleeding: Secondary | ICD-10-CM | POA: Diagnosis present

## 2019-12-30 DIAGNOSIS — N390 Urinary tract infection, site not specified: Secondary | ICD-10-CM | POA: Diagnosis present

## 2019-12-30 DIAGNOSIS — Z87820 Personal history of traumatic brain injury: Secondary | ICD-10-CM

## 2019-12-30 DIAGNOSIS — R54 Age-related physical debility: Secondary | ICD-10-CM | POA: Diagnosis present

## 2019-12-30 DIAGNOSIS — Z452 Encounter for adjustment and management of vascular access device: Secondary | ICD-10-CM

## 2019-12-30 DIAGNOSIS — E785 Hyperlipidemia, unspecified: Secondary | ICD-10-CM | POA: Diagnosis present

## 2019-12-30 DIAGNOSIS — E11649 Type 2 diabetes mellitus with hypoglycemia without coma: Secondary | ICD-10-CM | POA: Diagnosis not present

## 2019-12-30 DIAGNOSIS — E871 Hypo-osmolality and hyponatremia: Secondary | ICD-10-CM | POA: Diagnosis present

## 2019-12-30 DIAGNOSIS — Z833 Family history of diabetes mellitus: Secondary | ICD-10-CM

## 2019-12-30 DIAGNOSIS — Z87442 Personal history of urinary calculi: Secondary | ICD-10-CM

## 2019-12-30 DIAGNOSIS — Z885 Allergy status to narcotic agent status: Secondary | ICD-10-CM

## 2019-12-30 DIAGNOSIS — F4310 Post-traumatic stress disorder, unspecified: Secondary | ICD-10-CM | POA: Diagnosis present

## 2019-12-30 DIAGNOSIS — Z79899 Other long term (current) drug therapy: Secondary | ICD-10-CM

## 2019-12-30 DIAGNOSIS — E860 Dehydration: Secondary | ICD-10-CM | POA: Diagnosis present

## 2019-12-30 DIAGNOSIS — R451 Restlessness and agitation: Secondary | ICD-10-CM | POA: Diagnosis not present

## 2019-12-30 DIAGNOSIS — E1142 Type 2 diabetes mellitus with diabetic polyneuropathy: Secondary | ICD-10-CM | POA: Diagnosis present

## 2019-12-30 DIAGNOSIS — G9341 Metabolic encephalopathy: Secondary | ICD-10-CM | POA: Diagnosis present

## 2019-12-30 DIAGNOSIS — G8929 Other chronic pain: Secondary | ICD-10-CM | POA: Diagnosis present

## 2019-12-30 DIAGNOSIS — Z9049 Acquired absence of other specified parts of digestive tract: Secondary | ICD-10-CM

## 2019-12-30 DIAGNOSIS — E1165 Type 2 diabetes mellitus with hyperglycemia: Secondary | ICD-10-CM | POA: Diagnosis present

## 2019-12-30 DIAGNOSIS — N19 Unspecified kidney failure: Secondary | ICD-10-CM | POA: Diagnosis present

## 2019-12-30 DIAGNOSIS — Z8249 Family history of ischemic heart disease and other diseases of the circulatory system: Secondary | ICD-10-CM

## 2019-12-30 DIAGNOSIS — N179 Acute kidney failure, unspecified: Principal | ICD-10-CM | POA: Diagnosis present

## 2019-12-30 DIAGNOSIS — R4182 Altered mental status, unspecified: Secondary | ICD-10-CM

## 2019-12-30 DIAGNOSIS — D696 Thrombocytopenia, unspecified: Secondary | ICD-10-CM | POA: Diagnosis present

## 2019-12-30 DIAGNOSIS — Z888 Allergy status to other drugs, medicaments and biological substances status: Secondary | ICD-10-CM

## 2019-12-30 DIAGNOSIS — Z886 Allergy status to analgesic agent status: Secondary | ICD-10-CM

## 2019-12-30 DIAGNOSIS — E876 Hypokalemia: Secondary | ICD-10-CM | POA: Diagnosis not present

## 2019-12-30 DIAGNOSIS — R531 Weakness: Secondary | ICD-10-CM

## 2019-12-30 DIAGNOSIS — M5412 Radiculopathy, cervical region: Secondary | ICD-10-CM

## 2019-12-30 DIAGNOSIS — N202 Calculus of kidney with calculus of ureter: Secondary | ICD-10-CM | POA: Diagnosis present

## 2019-12-30 LAB — CBG MONITORING, ED
Glucose-Capillary: 146 mg/dL — ABNORMAL HIGH (ref 70–99)
Glucose-Capillary: 45 mg/dL — ABNORMAL LOW (ref 70–99)

## 2019-12-30 LAB — URINALYSIS, ROUTINE W REFLEX MICROSCOPIC
Bilirubin Urine: NEGATIVE
Glucose, UA: 50 mg/dL — AB
Ketones, ur: NEGATIVE mg/dL
Nitrite: NEGATIVE
Protein, ur: 100 mg/dL — AB
RBC / HPF: >50 RBC/hpf — ABNORMAL HIGH (ref 0–5)
Specific Gravity, Urine: 1.015 (ref 1.005–1.030)
pH: 5 (ref 5.0–8.0)

## 2019-12-30 LAB — CBC
HCT: 38.5 % (ref 36.0–46.0)
HCT: 31.4 % — ABNORMAL LOW (ref 36.0–46.0)
Hemoglobin: 13.3 g/dL (ref 12.0–15.0)
Hemoglobin: 10.8 g/dL — ABNORMAL LOW (ref 12.0–15.0)
MCH: 29.9 pg (ref 26.0–34.0)
MCH: 29.8 pg (ref 26.0–34.0)
MCHC: 34.5 g/dL (ref 30.0–36.0)
MCHC: 34.4 g/dL (ref 30.0–36.0)
MCV: 86.5 fL (ref 80.0–100.0)
MCV: 86.7 fL (ref 80.0–100.0)
Platelets: 146 10*3/uL — ABNORMAL LOW (ref 150–400)
Platelets: 97 10*3/uL — ABNORMAL LOW (ref 150–400)
RBC: 4.45 MIL/uL (ref 3.87–5.11)
RBC: 3.62 MIL/uL — ABNORMAL LOW (ref 3.87–5.11)
RDW: 14.1 % (ref 11.5–15.5)
RDW: 14.1 % (ref 11.5–15.5)
WBC: 11.1 10*3/uL — ABNORMAL HIGH (ref 4.0–10.5)
WBC: 9.1 10*3/uL (ref 4.0–10.5)
nRBC: 0 % (ref 0.0–0.2)
nRBC: 0 % (ref 0.0–0.2)

## 2019-12-30 LAB — POCT I-STAT EG7
Acid-base deficit: 20 mmol/L — ABNORMAL HIGH (ref 0.0–2.0)
Bicarbonate: 7.2 mmol/L — ABNORMAL LOW (ref 20.0–28.0)
Calcium, Ion: 0.92 mmol/L — ABNORMAL LOW (ref 1.15–1.40)
HCT: 38 % (ref 36.0–46.0)
Hemoglobin: 12.9 g/dL (ref 12.0–15.0)
O2 Saturation: 94 %
Potassium: 4.1 mmol/L (ref 3.5–5.1)
Sodium: 132 mmol/L — ABNORMAL LOW (ref 135–145)
TCO2: 8 mmol/L — ABNORMAL LOW (ref 22–32)
pCO2, Ven: 20 mmHg — ABNORMAL LOW (ref 44.0–60.0)
pH, Ven: 7.162 — CL (ref 7.250–7.430)
pO2, Ven: 87 mmHg — ABNORMAL HIGH (ref 32.0–45.0)

## 2019-12-30 LAB — MAGNESIUM: Magnesium: 1.5 mg/dL — ABNORMAL LOW (ref 1.7–2.4)

## 2019-12-30 LAB — BASIC METABOLIC PANEL
Anion gap: 26 — ABNORMAL HIGH (ref 5–15)
BUN: 165 mg/dL — ABNORMAL HIGH (ref 8–23)
CO2: 10 mmol/L — ABNORMAL LOW (ref 22–32)
Calcium: 7.8 mg/dL — ABNORMAL LOW (ref 8.9–10.3)
Chloride: 97 mmol/L — ABNORMAL LOW (ref 98–111)
Creatinine, Ser: 7.15 mg/dL — ABNORMAL HIGH (ref 0.44–1.00)
GFR calc Af Amer: 6 mL/min — ABNORMAL LOW (ref >60)
GFR calc non Af Amer: 5 mL/min — ABNORMAL LOW (ref >60)
Glucose, Bld: 163 mg/dL — ABNORMAL HIGH (ref 70–99)
Potassium: 3.8 mmol/L (ref 3.5–5.1)
Sodium: 133 mmol/L — ABNORMAL LOW (ref 135–145)

## 2019-12-30 LAB — CK: Total CK: 53 U/L (ref 38–234)

## 2019-12-30 LAB — RENAL FUNCTION PANEL
Albumin: 3 g/dL — ABNORMAL LOW (ref 3.5–5.0)
Anion gap: 23 — ABNORMAL HIGH (ref 5–15)
BUN: 160 mg/dL — ABNORMAL HIGH (ref 8–23)
CO2: 10 mmol/L — ABNORMAL LOW (ref 22–32)
Calcium: 6.8 mg/dL — ABNORMAL LOW (ref 8.9–10.3)
Chloride: 104 mmol/L (ref 98–111)
Creatinine, Ser: 6.37 mg/dL — ABNORMAL HIGH (ref 0.44–1.00)
GFR calc Af Amer: 7 mL/min — ABNORMAL LOW (ref >60)
GFR calc non Af Amer: 6 mL/min — ABNORMAL LOW (ref >60)
Glucose, Bld: 59 mg/dL — ABNORMAL LOW (ref 70–99)
Phosphorus: 7 mg/dL — ABNORMAL HIGH (ref 2.5–4.6)
Potassium: 3.2 mmol/L — ABNORMAL LOW (ref 3.5–5.1)
Sodium: 137 mmol/L (ref 135–145)

## 2019-12-30 LAB — RAPID URINE DRUG SCREEN, HOSP PERFORMED
Amphetamines: NOT DETECTED
Barbiturates: NOT DETECTED
Benzodiazepines: POSITIVE — AB
Cocaine: NOT DETECTED
Opiates: NOT DETECTED
Tetrahydrocannabinol: NOT DETECTED

## 2019-12-30 LAB — PROTEIN / CREATININE RATIO, URINE
Creatinine, Urine: 65.72 mg/dL
Protein Creatinine Ratio: 2.13 mg/mg{Cre} — ABNORMAL HIGH (ref 0.00–0.15)
Total Protein, Urine: 140 mg/dL

## 2019-12-30 LAB — AMMONIA: Ammonia: 29 umol/L (ref 9–35)

## 2019-12-30 LAB — GLUCOSE, CAPILLARY
Glucose-Capillary: 150 mg/dL — ABNORMAL HIGH (ref 70–99)
Glucose-Capillary: 93 mg/dL (ref 70–99)

## 2019-12-30 LAB — HEPATIC FUNCTION PANEL
ALT: 13 U/L (ref 0–44)
AST: 6 U/L — ABNORMAL LOW (ref 15–41)
Albumin: 3.8 g/dL (ref 3.5–5.0)
Alkaline Phosphatase: 94 U/L (ref 38–126)
Bilirubin, Direct: <0.1 mg/dL (ref 0.0–0.2)
Total Bilirubin: 1.7 mg/dL — ABNORMAL HIGH (ref 0.3–1.2)
Total Protein: 7.1 g/dL (ref 6.5–8.1)

## 2019-12-30 LAB — TROPONIN I (HIGH SENSITIVITY): Troponin I (High Sensitivity): 16 ng/L (ref <18)

## 2019-12-30 LAB — HIV ANTIBODY (ROUTINE TESTING W REFLEX): HIV Screen 4th Generation wRfx: NONREACTIVE

## 2019-12-30 LAB — PROTIME-INR
INR: 1.4 — ABNORMAL HIGH (ref 0.8–1.2)
Prothrombin Time: 16.6 seconds — ABNORMAL HIGH (ref 11.4–15.2)

## 2019-12-30 LAB — SARS CORONAVIRUS 2 BY RT PCR (HOSPITAL ORDER, PERFORMED IN ~~LOC~~ HOSPITAL LAB): SARS Coronavirus 2: NEGATIVE

## 2019-12-30 LAB — PHOSPHORUS: Phosphorus: 8.4 mg/dL — ABNORMAL HIGH (ref 2.5–4.6)

## 2019-12-30 LAB — LACTIC ACID, PLASMA
Lactic Acid, Venous: 2.7 mmol/L (ref 0.5–1.9)
Lactic Acid, Venous: 2.4 mmol/L (ref 0.5–1.9)

## 2019-12-30 LAB — VITAMIN B12: Vitamin B-12: 821 pg/mL (ref 180–914)

## 2019-12-30 LAB — ETHANOL: Alcohol, Ethyl (B): <10 mg/dL (ref <10)

## 2019-12-30 MED ORDER — SODIUM CHLORIDE 0.9 % IV SOLN
1.0000 g | Freq: Once | INTRAVENOUS | Status: AC
  Administered 2019-12-30: 1 g via INTRAVENOUS
  Filled 2019-12-30: qty 10

## 2019-12-30 MED ORDER — HEPARIN SODIUM (PORCINE) 1000 UNIT/ML IJ SOLN
3000.0000 [IU] | INTRAMUSCULAR | Status: AC
  Filled 2019-12-30: qty 3

## 2019-12-30 MED ORDER — LACTATED RINGERS IV BOLUS
1000.0000 mL | Freq: Once | INTRAVENOUS | Status: AC
  Administered 2019-12-30: 1000 mL via INTRAVENOUS

## 2019-12-30 MED ORDER — DEXTROSE 10 % IV SOLN: INTRAVENOUS | Status: DC

## 2019-12-30 MED ORDER — DEXTROSE 50 % IV SOLN
INTRAVENOUS | Status: AC
  Filled 2019-12-30: qty 50

## 2019-12-30 MED ORDER — POLYETHYLENE GLYCOL 3350 17 G PO PACK: 17.0000 g | PACK | Freq: Every day | ORAL | Status: DC | PRN

## 2019-12-30 MED ORDER — DOCUSATE SODIUM 100 MG PO CAPS: 100.0000 mg | ORAL_CAPSULE | Freq: Two times a day (BID) | ORAL | Status: DC | PRN

## 2019-12-30 MED ORDER — SODIUM CHLORIDE 0.9 % IV BOLUS
1000.0000 mL | Freq: Once | INTRAVENOUS | Status: AC
  Administered 2019-12-30: 1000 mL via INTRAVENOUS

## 2019-12-30 MED ORDER — CHLORHEXIDINE GLUCONATE CLOTH 2 % EX PADS
6.0000 | MEDICATED_PAD | Freq: Every day | CUTANEOUS | Status: DC
  Administered 2019-12-31 – 2020-01-03 (×4): 6 via TOPICAL

## 2019-12-30 MED ORDER — STERILE WATER FOR INJECTION IV SOLN
INTRAVENOUS | Status: DC
  Filled 2019-12-30 (×2): qty 850

## 2019-12-30 MED ORDER — HEPARIN SODIUM (PORCINE) 1000 UNIT/ML IJ SOLN
INTRAMUSCULAR | Status: AC
  Filled 2019-12-30: qty 3

## 2019-12-30 MED ORDER — HEPARIN SODIUM (PORCINE) 5000 UNIT/ML IJ SOLN
5000.0000 [IU] | Freq: Three times a day (TID) | INTRAMUSCULAR | Status: DC
  Administered 2019-12-30 – 2020-01-03 (×11): 5000 [IU] via SUBCUTANEOUS
  Filled 2019-12-30 (×11): qty 1

## 2019-12-30 MED ORDER — CHLORHEXIDINE GLUCONATE CLOTH 2 % EX PADS
6.0000 | MEDICATED_PAD | Freq: Every day | CUTANEOUS | Status: DC
  Administered 2020-01-03: 6 via TOPICAL

## 2019-12-30 MED ORDER — POTASSIUM CHLORIDE CRYS ER 20 MEQ PO TBCR
20.0000 meq | EXTENDED_RELEASE_TABLET | Freq: Once | ORAL | Status: AC
  Administered 2019-12-30: 20 meq via ORAL
  Filled 2019-12-30: qty 1

## 2019-12-30 MED ORDER — SODIUM BICARBONATE 8.4 % IV SOLN
50.0000 meq | Freq: Once | INTRAVENOUS | Status: AC
  Administered 2019-12-30: 50 meq via INTRAVENOUS
  Filled 2019-12-30: qty 50

## 2019-12-30 MED ORDER — SODIUM CHLORIDE 0.9% FLUSH
3.0000 mL | Freq: Once | INTRAVENOUS | Status: AC
  Administered 2019-12-30: 3 mL via INTRAVENOUS

## 2019-12-30 NOTE — Telephone Encounter (Signed)
Spoke with husband. He stated pt has declined significantly. She barely responds. I was able to hear her say "yes" in the background of the phone. He said she slowly follows commands after being asked several times. She is sitting in the car staring off. She is weak. He has to assist her with dressing,everything. She isn't eating. He reports she has a UTI + for strep. I advised him the UTI certainly could contribute to the decline but I advised him to go to the emergency department at [facility] (they are close by there). Dr. Lucia Gaskins is aware of this as and agrees to have patient go to [facility] ER. Husband was concerned they would not do anything but I reassured him of the resources and specialties and that the patient would be cared for. He agreed to take her. His questions were answered during the call.    Per Dr. Lucia Gaskins we are advising pt go to the ER for now. Will hold on r/s EMG.

## 2019-12-30 NOTE — ED Triage Notes (Signed)
To ED for eval of weakness and abd pain for past month - has not seen a doctor. Pt appears very weak. Soft spoken. Appears pale. Husband not readily available for further info - moving the car.

## 2019-12-30 NOTE — Procedures (Signed)
Hemodialysis Catheter Insertion Procedure Note Monica [name] [id number] [birth year]  Procedure: Insertion of Hemodialysis Catheter Indications: Dialysis Access   Procedure Details Consent: Risks of procedure as well as the alternatives and risks of each were explained to the (patient/caregiver).  Consent for procedure obtained. Time Out: Verified patient identification, verified procedure, site/side was marked, verified correct patient position, special equipment/implants available, medications/allergies/relevent history reviewed, required imaging and test results available.  Performed  Maximum sterile technique was used including antiseptics, cap, gloves, gown, hand hygiene, mask and sheet. Skin prep: Chlorhexidine; local anesthetic administered Triple lumen hemodialysis catheter was inserted into right internal jugular vein using the Seldinger technique.  Evaluation Blood flow good Complications: No apparent complications Patient did tolerate procedure well. Chest X-ray ordered to verify placement.  CXR: pending.   Monica [name] 12/30/2019 Simonne Martinet ACNP-BC [facility] Pulmonary/Critical Care Pager # [phone] OR # [phone] if no answer

## 2019-12-30 NOTE — Telephone Encounter (Signed)
Husband would like a call back. Patient is declining in health. He would like her hospitalized. Best call back is  [phone] (They arrived 8 min late for today's apt and resch)

## 2019-12-30 NOTE — H&P (Signed)
NAME:  Monica [name], MRN:  [id number], DOB:  Feb 15, 1951, LOS: 0 ADMISSION DATE:  12/30/2019, CONSULTATION DATE:  5/20 REFERRING MD:  rees, CHIEF COMPLAINT:  Acute metabolic encephalopathy, metabolic encephalopathy and AKI  Brief History    69 year old diabetic female presented 5/20 w/ new renal failure, metabolic acidosis and acute metabolic encephalopathy  History of present illness   Chronic sub acute pain, weakness, and paresthesia of LEs to point that she has required assist w/ walking and assist w/ ADLs. She had a recent dx of kidney for which she underwent lithotripsy. Since that point her PO intake as been poor, she has become weaker and not able to cary out conversation over last 24 hrs. She was brought to ER by husband.  In ER found to be lethargic, had sig metabolic acidosis (+ anion gap),  Bicarb 10, cr 7.15 (from nml cr of 0.9) Na 133, glucose 163, hgb 13.3, lactic acid 2.4, total CK 53. She was dx w/ new AKI. Seen by nephrology w/ plan for urgent HD. PCCM asked to admit    Past Medical History  Ptsd, occipital neuralgia, renal lithiasis, gerd, glaucoma, DM, head aches. Currently being worked up by neurology for yet to be defined progressive LE weakness and paresthesia of the lower extremities  Significant Hospital Events   5/20 admitted  Consults:  Nephrology   Procedures:  Right IJ CVL HD cath 5/20>>>  Significant Diagnostic Tests:  Bed side ECHO 5/2p0: collapsed IVC   Micro Data:  UC 5/20>>>  Antimicrobials:    Interim history/subjective:  Awake but not in distress  Objective   Blood pressure (Abnormal) 130/106, pulse (Abnormal) 111, temperature 97.7 F (36.5 C), temperature source Oral, resp. rate 12, height '5\' 4"'$  (1.626 m), weight 62.6 kg, SpO2 100 %.       No intake or output data in the 24 hours ending 12/30/19 1449 Filed Weights   12/30/19 0933  Weight: 62.6 kg    Examination: General: critically ill appearing 69 year old female. Currently  sitting up right in bed. Hemodynamically stable HENT: MM are dry. Sclera are not icteric no JVD Lungs: clear, decreased bases. No accessory use currently on room air Cardiovascular: tachy rrr Abdomen: soft not tender + bowel sounds Extremities: BLE edema brisk CR pain top palp particularly on LEs Neuro: confused. Follows commands. RLE is weak. Has pain to gentle palpation of the RLE GU: due to void   Resolved Hospital Problem list     Assessment & Plan:  Acute renal failure. Suspect multifactorial (recent kidney stone s/p lithotripsy w/ imaging today not showing any obstruction), dehydration and NSAIDs Nephrolithiasis (now non-obstructive) Plan Cont IVFs; will bolus another liter on LR Bicarb gtt Central Line HD cath  Repeat serial chemistries Dc NSAIDs Strict I&O Will prob need HD Serial chemistries  Possible UTI Plan Check UC Hold abx for now   Acute anion gap metabolic acidosis  2/2 aki also has mild lactic acidosis Plan HD Bicarb gtt Supportive care  Fluid and electrolyte imbalance: hyponatremia, hyperphosphatemia, and hypomagnesemia Plan Replace and recheck  Acute metabolic encephalopathy 2/2 AKI and acidosis Plan Supportive care Treat above  DM Plan ssi  Chronic lower extremity weakness and paresthesia Plan Supportive care Being worked up in out pt setting   Best practice:  Diet: NPO Pain/Anxiety/Delirium protocol (if indicated): NA VAP protocol (if indicated): NA DVT prophylaxis: [place] heparin  GI prophylaxis: NA  Glucose control: ssi Mobility: bedrest Code Status: full  Family Communication: husband  Disposition: to ICU   Labs   CBC: Recent Labs  Lab 12/30/19 0948 12/30/19 1217  WBC 11.1*  --   HGB 13.3 12.9  HCT 38.5 38.0  MCV 86.5  --   PLT 146*  --     Basic Metabolic Panel: Recent Labs  Lab 12/30/19 0948 12/30/19 1217  NA 133* 132*  K 3.8 4.1  CL 97*  --   CO2 10*  --   GLUCOSE 163*  --   BUN 165*  --   CREATININE  7.15*  --   CALCIUM 7.8*  --   MG 1.5*  --   PHOS 8.4*  --    GFR: Estimated Creatinine Clearance: 6.6 mL/min (A) (by C-G formula based on SCr of 7.15 mg/dL (H)). Recent Labs  Lab 12/30/19 0948 12/30/19 1130 12/30/19 1330  WBC 11.1*  --   --   LATICACIDVEN  --  2.7* 2.4*    Liver Function Tests: Recent Labs  Lab 12/30/19 1111  AST 6*  ALT 13  ALKPHOS 94  BILITOT 1.7*  PROT 7.1  ALBUMIN 3.8   No results for input(s): LIPASE, AMYLASE in the last 168 hours. Recent Labs  Lab 12/30/19 1046  AMMONIA 29    ABG    Component Value Date/Time   HCO3 7.2 (L) 12/30/2019 1217   TCO2 8 (L) 12/30/2019 1217   ACIDBASEDEF 20.0 (H) 12/30/2019 1217   O2SAT 94.0 12/30/2019 1217     Coagulation Profile: Recent Labs  Lab 12/30/19 1207  INR 1.4*    Cardiac Enzymes: Recent Labs  Lab 12/30/19 1333  CKTOTAL 53    HbA1C: HB A1C (BAYER DCA - WAIVED)  Date/Time Value Ref Range Status  10/22/2019 10:12 AM 9.9 (H) <7.0 % Final    Comment:                                          Diabetic Adult            <7.0                                       Healthy Adult        4.3 - 5.7                                                           (DCCT/NGSP) American Diabetes Association's Summary of Glycemic Recommendations for Adults with Diabetes: Hemoglobin A1c <7.0%. More stringent glycemic goals (A1c <6.0%) may further reduce complications at the cost of increased risk of hypoglycemia.   06/29/2019 01:27 PM >14.0 (H) <7.0 % Final    Comment:                                          Diabetic Adult            <7.0  Healthy Adult        4.3 - 5.7                                                           (DCCT/NGSP) American Diabetes Association's Summary of Glycemic Recommendations for Adults with Diabetes: Hemoglobin A1c <7.0%. More stringent glycemic goals (A1c <6.0%) may further reduce complications at the cost of increased risk of  hypoglycemia.     CBG: Recent Labs  Lab 12/30/19 0940  GLUCAP 146*    Review of Systems:   Not able to right  Now   Past Medical History  She,  has a past medical history of Arthritis, Cervical facet joint syndrome (04/22/2012), Cervicalgia, Chronic headaches, Chronic nausea, Complication of anesthesia, DM (diabetes mellitus) ([place]), Elevated liver enzymes, GERD (gastroesophageal reflux disease) (05/31/2011), Glaucoma, Hemorrhoids (11/17/08), Occipital neuralgia, PTSD (post-traumatic stress disorder), Renal lithiasis, Seizure ([place]), Torticollis, and Unspecified musculoskeletal disorders and symptoms referable to neck.   Surgical History    Past Surgical History:  Procedure Laterality Date  . BREAST LUMPECTOMY     left  . CATARACT EXTRACTION W/PHACO Left 10/07/2016   Procedure: CATARACT EXTRACTION PHACO AND INTRAOCULAR LENS PLACEMENT (IOC);  Surgeon: Tonny Branch, MD;  Location: [facility];  Service: Ophthalmology;  Laterality: Left;  CDE: 4.98  . CATARACT EXTRACTION W/PHACO Right 10/21/2016   Procedure: CATARACT EXTRACTION PHACO AND INTRAOCULAR LENS PLACEMENT RIGHT EYE CDE - 6.24;  Surgeon: Tonny Branch, MD;  Location: [facility];  Service: Ophthalmology;  Laterality: Right;  right  . CHOLECYSTECTOMY    . COLONOSCOPY  11/17/08   ext hemorrhoids  . CRANIOTOMY     secondary TBI  . CYST EXCISION     left wrist  . ESOPHAGOGASTRODUODENOSCOPY  04/05/10   [name] (78F),erosive reflux esophagitis, small hiatal hernia,  . ESOPHAGOGASTRODUODENOSCOPY (EGD) WITH PROPOFOL N/A 08/30/2019   Procedure: ESOPHAGOGASTRODUODENOSCOPY (EGD) WITH PROPOFOL;  Surgeon: Daneil Dolin, MD;  [facility];  Service: Endoscopy;  Laterality: N/A;  3:30pm - pt knows to arrive at 9:45  . EXTRACORPOREAL SHOCK WAVE LITHOTRIPSY Left 12/13/2019   Procedure: EXTRACORPOREAL SHOCK WAVE LITHOTRIPSY (ESWL);  Surgeon: Festus Aloe, MD;  Location: [facility];  Service: Urology;  Laterality: Left;    . FINGER SURGERY     removal cyst left pinky  . gastro empy study  03/03/10   normal  . HAND SURGERY    . MALONEY DILATION N/A 08/30/2019   Procedure: Venia Minks DILATION;  Surgeon: Daneil Dolin, MD;  [facility];  Service: Endoscopy;  Laterality: N/A;  . NECK SURGERY  1998  . NEPHROLITHOTOMY  05/13/2012   Procedure: NEPHROLITHOTOMY PERCUTANEOUS;  Surgeon: Franchot Gallo, MD;  Location: [facility];  Service: Urology;  Laterality: Left;        Social History   reports that she has never smoked. She has never used smokeless tobacco. She reports that she does not drink alcohol or use drugs.   Family History   Her family history includes Colon cancer (age of onset: 62) in her mother; Diabetes in her father and paternal grandmother; Diverticulitis in her daughter; Heart disease in her father and paternal grandfather.   Allergies Allergies  Allergen Reactions  . Aspirin Anaphylaxis and Hives    REACTION: slurred speech, blurred vision  . Hydromorphone  Hcl Shortness Of Breath    severe  . Vimpat [Lacosamide] Other (See Comments)    Hallucinations, out of body experience, confusion   . Capsaicin     Red rash  . Depakote [Divalproex Sodium]   . Milnacipran Hcl Other (See Comments)    dizziness  . Pristiq [Desvenlafaxine Succinate Monohydrate] Other (See Comments)    dizziness  . Gabapentin     States leg pain worsened, pt d/c'd     Home Medications  Prior to Admission medications   Medication Sig Start Date End Date Taking? Authorizing Provider  atorvastatin (LIPITOR) 10 MG tablet Take 1 tablet (10 mg total) by mouth daily. 10/05/19  Yes [name] F, FNP  cephALEXin (KEFLEX) 500 MG capsule Take 500 mg by mouth 2 (two) times daily. 12/29/19  Yes [provider]  glipiZIDE (GLUCOTROL) 5 MG tablet Take 1 tablet (5 mg total) by mouth daily before breakfast. 10/25/19  Yes [name] F, FNP  ibuprofen (ADVIL) 200 MG tablet Take 800 mg by mouth every 8 (eight)  hours as needed for moderate pain.   Yes [provider]  metFORMIN (GLUCOPHAGE) 500 MG tablet Take 2 tablets (1,000 mg total) by mouth 2 (two) times daily. 10/05/19  Yes [name] F, FNP  methadone (DOLOPHINE) 10 MG tablet Take 2 tablets (20 mg total) by mouth 3 (three) times daily. 12/15/19  Yes Meredith Staggers, MD  naproxen (NAPROSYN) 500 MG tablet Take 500 mg by mouth 2 (two) times daily. 12/16/19  Yes [provider]  nortriptyline (PAMELOR) 25 MG capsule Take 1 capsule (25 mg total) by mouth at bedtime. 12/15/19  Yes Meredith Staggers, MD  ondansetron (ZOFRAN) 4 MG tablet Take 1 tablet (4 mg total) by mouth every 8 (eight) hours as needed for nausea or vomiting. 12/07/19  Yes Triplett, Tammy, PA-C  tapentadol (NUCYNTA) 50 MG tablet Take 1 tablet (50 mg total) by mouth daily as needed (migraines). 10/13/19  Yes Meredith Staggers, MD  tiZANidine (ZANAFLEX) 2 MG tablet TAKE (1) TABLET EVERY SIX HOURS AS NEEDED FOR MUSCLE SPASMS. Patient taking differently: Take 2 mg by mouth every 6 (six) hours as needed for muscle spasms.  11/25/19  Yes Meredith Staggers, MD  topiramate (TOPAMAX) 50 MG tablet TAKE 2 TABLETS IN THE MORNING AND 4 TABLETS AT BEDTIME. Patient taking differently: Take 100-200 mg by mouth See admin instructions. TAKE 2 TABLETS IN THE MORNING AND 4 TABLETS AT BEDTIME. 10/19/19  Yes Meredith Staggers, MD  blood glucose meter kit and supplies KIT Dispense based on patient and insurance preference. Use up to four times daily as directed. (FOR ICD-9 250.00, 250.01). Patient not taking: Reported on 12/07/2019 11/18/19   Loman Brooklyn, FNP  ondansetron (ZOFRAN) 4 MG tablet Take 1 tablet (4 mg total) by mouth every 6 (six) hours. As needed for nausea vomiting Patient not taking: Reported on 12/30/2019 12/07/19   Kem Parkinson, PA-C     Critical care time:39 minutes     Erick Colace ACNP-BC [place] Pager # [phone] OR # [phone] if no answer

## 2019-12-30 NOTE — Progress Notes (Signed)
eLink Physician-Brief Progress Note Patient Name: Monica [name] DOB: 05/08/1951 MRN: [id number]   Date of Service  12/30/2019  HPI/Events of Note  Hypoglycemia - Blood glucose = 45.   eICU Interventions  Will order: 1. D10W to run IV at 20 mL/hour.     Intervention Category Major Interventions: Other:  Lenell Antu 12/30/2019, 7:43 PM

## 2019-12-30 NOTE — Consult Note (Addendum)
[facility] Renal Consultation Note  Requesting MD: Quintella Reichert, MD Indication for Consultation:  AKI   Chief complaint: altered mental status   HPI:  Monica [name] is a 69 y.o. female with a history including chronic pain, chronic headaches, diabetes, GERD with erosive esophagitis, arthritis, nephrolithiasis, and diabetes who presented to the ER with altered mental status.  Per her husband, she has had a downward decline for the past several months since January.  He states that 2 weeks ago she had lithotripsy for stone and that she has been managed as an outpatient.  He states that today "I just gave up" she was just getting worse.  She has had confusion and over the past 24 hours her mental status has further deteriorated from being able to be assisted with taking medications and assisted with getting to the restroom and moving around to being much less responsive than normal and not eating or drinking much.  He states that she has actually not been eating or drinking much for a week.  Over the last 24 hours her urine output has decreased and she has only urinated every 4 hours whereas previously she was going every hour.  He states that she has been taking 4 Advil at a time 3 times a day for pain.  She has had nausea and she does report this to me today.  Her husband states that yesterday she told him that she tasted garlic but had not really eaten anything and specifically had not eaten anything with garlic.  I spoke with her husband about the risks benefits and indications of dialysis and he did consent for dialysis.  CK is 53.  LA 2.7 - 2.4.  CT stone study with 11 x 3 mm calculus within the distal left ureter approximately 6 cm above the UVJ. Resolution of proximal hydroureteronephrosis.  Unchanged nonobstructing left renal calculi.  Creat  Date/Time Value Ref Range Status  05/04/2012 11:09 AM 1.09 0.50 - 1.10 mg/dL Final   Creatinine, Ser  Date/Time Value Ref Range  Status  12/30/2019 09:48 AM 7.15 (H) 0.44 - 1.00 mg/dL Final  12/07/2019 04:32 PM 0.91 0.44 - 1.00 mg/dL Final  10/22/2019 10:12 AM 1.05 (H) 0.57 - 1.00 mg/dL Final  06/29/2019 03:24 PM 0.97 0.57 - 1.00 mg/dL Final  10/03/2016 12:48 PM 0.87 0.44 - 1.00 mg/dL Final  09/17/2016 10:02 AM 0.83 0.44 - 1.00 mg/dL Final  07/31/2012 11:22 AM 1.10 0.50 - 1.10 mg/dL Final  05/15/2012 10:35 AM 0.82 0.50 - 1.10 mg/dL Final  05/08/2012 03:00 PM 0.92 0.50 - 1.10 mg/dL Final  06/05/2010 09:51 PM 0.96 0.40 - 1.20 mg/dL Final  04/03/2010 10:46 AM 0.82 0.4 - 1.2 mg/dL Final  12/22/2009 09:17 AM 0.81 0.4 - 1.2 mg/dL Final     PMHx:   Past Medical History:  Diagnosis Date  . Arthritis   . Cervical facet joint syndrome 04/22/2012  . Cervicalgia   . Chronic headaches    Dr Tessa Lerner [place] [facility]  . Chronic nausea    normal GES   . Complication of anesthesia    had problem with local anesthesia-tried to assist Physician  . DM (diabetes mellitus) ([place])   . Elevated liver enzymes    hx of, normal 03/2010  . GERD (gastroesophageal reflux disease) 05/31/2011   History Schatzki's ring, erosive reflux esophagitis, status post last EGD and dilation 04/05/10   . Glaucoma    lens in both eyes  . Hemorrhoids 11/17/08   Colonoscopy  Dr Laural Golden  . Occipital neuralgia   . PTSD (post-traumatic stress disorder)   . Renal lithiasis   . Seizure ([place])    12 yrs ago-med related  . Torticollis   . Unspecified musculoskeletal disorders and symptoms referable to neck    cervical/trapezius    Past Surgical History:  Procedure Laterality Date  . BREAST LUMPECTOMY     left  . CATARACT EXTRACTION W/PHACO Left 10/07/2016   Procedure: CATARACT EXTRACTION PHACO AND INTRAOCULAR LENS PLACEMENT (IOC);  Surgeon: Tonny Branch, MD;  Location: [facility];  Service: Ophthalmology;  Laterality: Left;  CDE: 4.98  . CATARACT EXTRACTION W/PHACO Right 10/21/2016   Procedure: CATARACT EXTRACTION PHACO AND INTRAOCULAR LENS PLACEMENT  RIGHT EYE CDE - 6.24;  Surgeon: Tonny Branch, MD;  Location: [facility];  Service: Ophthalmology;  Laterality: Right;  right  . CHOLECYSTECTOMY    . COLONOSCOPY  11/17/08   ext hemorrhoids  . CRANIOTOMY     secondary TBI  . CYST EXCISION     left wrist  . ESOPHAGOGASTRODUODENOSCOPY  04/05/10   [name] (27F),erosive reflux esophagitis, small hiatal hernia,  . ESOPHAGOGASTRODUODENOSCOPY (EGD) WITH PROPOFOL N/A 08/30/2019   Procedure: ESOPHAGOGASTRODUODENOSCOPY (EGD) WITH PROPOFOL;  Surgeon: Daneil Dolin, MD;  [facility];  Service: Endoscopy;  Laterality: N/A;  3:30pm - pt knows to arrive at 9:45  . EXTRACORPOREAL SHOCK WAVE LITHOTRIPSY Left 12/13/2019   Procedure: EXTRACORPOREAL SHOCK WAVE LITHOTRIPSY (ESWL);  Surgeon: Festus Aloe, MD;  [facility];  Service: Urology;  Laterality: Left;  . FINGER SURGERY     removal cyst left pinky  . gastro empy study  03/03/10   normal  . HAND SURGERY    . MALONEY DILATION N/A 08/30/2019   Procedure: Venia Minks DILATION;  Surgeon: Daneil Dolin, MD;  [facility];  Service: Endoscopy;  Laterality: N/A;  . NECK SURGERY  1998  . NEPHROLITHOTOMY  05/13/2012   Procedure: NEPHROLITHOTOMY PERCUTANEOUS;  Surgeon: Franchot Gallo, MD;  Location: [facility];  Service: Urology;  Laterality: Left;       Family Hx:  Family History  Problem Relation Age of Onset  . Colon cancer Mother 21  . Heart disease Father   . Diabetes Father   . Diabetes Paternal Grandmother   . Heart disease Paternal Grandfather   . Diverticulitis Daughter   no family history of ESRD  Social History:  reports that she has never smoked. She has never used smokeless tobacco. She reports that she does not drink alcohol or use drugs.  Allergies:  Allergies  Allergen Reactions  . Aspirin Anaphylaxis and Hives    REACTION: slurred speech, blurred vision  . Hydromorphone Hcl Shortness Of Breath    severe  . Vimpat [Lacosamide]  Other (See Comments)    Hallucinations, out of body experience, confusion   . Capsaicin     Red rash  . Depakote [Divalproex Sodium]   . Milnacipran Hcl Other (See Comments)    dizziness  . Pristiq [Desvenlafaxine Succinate Monohydrate] Other (See Comments)    dizziness  . Gabapentin     States leg pain worsened, pt d/c'd    Medications: Prior to Admission medications   Medication Sig Start Date End Date Taking? Authorizing Provider  atorvastatin (LIPITOR) 10 MG tablet Take 1 tablet (10 mg total) by mouth daily. 10/05/19   Loman Brooklyn, FNP  blood glucose meter kit and supplies KIT Dispense based on patient and insurance preference. Use up to four times  daily as directed. (FOR ICD-9 250.00, 250.01). Patient not taking: Reported on 12/07/2019 11/18/19   Loman Brooklyn, FNP  glipiZIDE (GLUCOTROL) 5 MG tablet Take 1 tablet (5 mg total) by mouth daily before breakfast. 10/25/19   Loman Brooklyn, FNP  metFORMIN (GLUCOPHAGE) 500 MG tablet Take 2 tablets (1,000 mg total) by mouth 2 (two) times daily. 10/05/19   Loman Brooklyn, FNP  methadone (DOLOPHINE) 10 MG tablet Take 2 tablets (20 mg total) by mouth 3 (three) times daily. 12/15/19   Meredith Staggers, MD  Misc Natural Products (OSTEO BI-FLEX ADV TRIPLE ST PO) Take 1 tablet by mouth daily.     [provider]  Multiple Vitamins-Minerals (MULTI-VITAMIN GUMMIES PO) Take 1 tablet by mouth daily.     [provider]  nortriptyline (PAMELOR) 25 MG capsule Take 1 capsule (25 mg total) by mouth at bedtime. 12/15/19   Meredith Staggers, MD  ondansetron (ZOFRAN) 4 MG tablet Take 1 tablet (4 mg total) by mouth every 8 (eight) hours as needed for nausea or vomiting. 12/07/19   Triplett, Tammy, PA-C  ondansetron (ZOFRAN) 4 MG tablet Take 1 tablet (4 mg total) by mouth every 6 (six) hours. As needed for nausea vomiting 12/07/19   Triplett, Tammy, PA-C  tapentadol (NUCYNTA) 50 MG tablet Take 1 tablet (50 mg total) by mouth daily as needed  (migraines). 10/13/19   Meredith Staggers, MD  tiZANidine (ZANAFLEX) 2 MG tablet TAKE (1) TABLET EVERY SIX HOURS AS NEEDED FOR MUSCLE SPASMS. Patient taking differently: Take 2 mg by mouth every 6 (six) hours as needed for muscle spasms.  11/25/19   Meredith Staggers, MD  topiramate (TOPAMAX) 50 MG tablet TAKE 2 TABLETS IN THE MORNING AND 4 TABLETS AT BEDTIME. Patient taking differently: Take 100-200 mg by mouth See admin instructions. TAKE 2 TABLETS IN THE MORNING AND 4 TABLETS AT BEDTIME. 10/19/19   Meredith Staggers, MD  vitamin B-12 (CYANOCOBALAMIN) 1000 MCG tablet Take 1,000 mcg by mouth at bedtime.     [provider]    I have reviewed the patient's current medications.  Labs:  BMP Latest Ref Rng & Units 12/30/2019 12/30/2019 12/07/2019  Glucose 70 - 99 mg/dL - 163(H) 331(H)  BUN 8 - 23 mg/dL - 165(H) 26(H)  Creatinine 0.44 - 1.00 mg/dL - 7.15(H) 0.91  BUN/Creat Ratio 12 - 28 - - -  Sodium 135 - 145 mmol/L 132(L) 133(L) 136  Potassium 3.5 - 5.1 mmol/L 4.1 3.8 3.7  Chloride 98 - 111 mmol/L - 97(L) 100  CO2 22 - 32 mmol/L - 10(L) 24  Calcium 8.9 - 10.3 mg/dL - 7.8(L) 9.2    Urinalysis    Component Value Date/Time   COLORURINE YELLOW 12/07/2019 1622   APPEARANCEUR HAZY (A) 12/07/2019 1622   LABSPEC 1.018 12/07/2019 1622   PHURINE 6.0 12/07/2019 1622   GLUCOSEU 150 (A) 12/07/2019 1622   HGBUR LARGE (A) 12/07/2019 1622   BILIRUBINUR NEGATIVE 12/07/2019 1622   KETONESUR 5 (A) 12/07/2019 1622   PROTEINUR 30 (A) 12/07/2019 1622   UROBILINOGEN 0.2 05/04/2012 1109   NITRITE NEGATIVE 12/07/2019 1622   LEUKOCYTESUR SMALL (A) 12/07/2019 1622     ROS:  Unable to perform full review of systems due to AMS.  She does report nausea and has a history of chronic pain.  Physical Exam: Vitals:   12/30/19 1030 12/30/19 1115  BP: 101/65 124/65  Pulse:    Resp: 12 (!) 21  Temp:  SpO2:       General: Adult female in stretcher tearful and agitated HEENT: Normocephalic  atraumatic Eyes: Extraocular movements intact sclera anicteric Neck: Supple trachea midline no JVD Heart: S1-S2 no rub Lungs: clear to auscultation bilaterally normal work of breathing at rest on room air Abdomen: Soft nontender nondistended Extremities: Trace to 1+ edema lower extremities there is pain to touch.  No lesions or erythema that I appreciate Skin: No rash on extremities exposed she has tattoos visible which do not appear erythematous Neuro: Patient does not answer questions and is not oriented. she answers 1 question which is whether or not she has had nausea and she states "I have to sit up" Psych tearful and agitated Foley catheter in place with minimal urine  Assessment/Plan:  # Acute kidney injury  - Multifactorial.  Recent prerenal insults with decreased p.o. intake as well as heavy NSAID use.  Appears history of obstructing stone which appears resolved per CT with residual nonobstructing stones reported.   - Bicarb infusion for now prior to HD  - I have consulted critical care for nontunneled catheter placement in the ER for dialysis.  The team in the ER and I have discussed that she is not able to leave the ER until she does have the access in place. - Hemodialysis today after access placement and plan for tomorrow, as well - Assess dialysis needs daily thereafter - Please continue foley catheter  - Check up/cr ratio when able  # Metabolic acidosis - Setting of AKI.  LA mildly elevated.  Has been on metformin - Status post 1 amp of bicarb - Bicarbonate infusion prior to HD - Assess HD needs daily  # Altered mental status - Patient with uremia and acidosis - Starting dialysis as above  # Nephrolithiasis  - Proximal hydro appears resolved per CT scan.  She has report of nonobstructing stones.  Reassess need for urology involvement   # UTI  - abx per primary team  - Urine culture   # DM - noted.  Per primary team.  Normoglycemic.   # Hyperphosphatemia.  initiating dialysis follow trends  Claudia Desanctis 12/30/2019, 2:42 PM   Hematuria noted - Does have a stone.  ANA, ANCA, and up/cr ratio ordered.  Check complement.    Claudia Desanctis 3:55 PM 12/30/2019

## 2019-12-30 NOTE — ED Notes (Signed)
Pt transported to CT ?

## 2019-12-30 NOTE — ED Provider Notes (Signed)
[facility] EMERGENCY DEPARTMENT Provider Note   CSN: [id number] Arrival date & time: 12/30/19  6767     History Chief Complaint  Patient presents with   Weakness   Abdominal Pain    Monica [name] is a 69 y.o. female.  The history is provided by the patient, the spouse and medical records. No language interpreter was used.  Weakness Associated symptoms: abdominal pain   Abdominal Pain  Monica [name] is a 69 y.o. female who presents to the Emergency Department complaining of weakness and confusion. Level V caveat due to confusion. History is provided by the husband. He brings his wife and for several months of progressive symptoms. He states that symptoms began with pain in her right leg around February. At that time she was driving and doing well overall. Since that time she is experienced progressive worsening of her symptoms. She now complains of pain and bilateral legs, right greater than left. Over the last few weeks she has stopped moving her right leg altogether in her pain radiates to her abdomen. He states over the last few days she has experienced altered mental status and is now less active and less verbal and more confused than baseline. He started administering her medications about two weeks ago because he noticed that she was having difficulty remembering to take them. She was recently treated for a kidney stone and was seen in the office on Monday. He received a call yesterday stating that she had strep in her urine and was started on Keflex last night. No reports of fevers, vomiting, diarrhea. Patient complains of chronic headaches, chronic neck pain, intermittent chest pain. She did have abdominal pain when she was diagnosed with a kidney stone, but no longer has any abdominal pain. She denies dysuria.    Past Medical History:  Diagnosis Date   Arthritis    Cervical facet joint syndrome 04/22/2012   Cervicalgia    Chronic headaches    Dr  Tessa Lerner [place] [facility]   Chronic nausea    normal GES    Complication of anesthesia    had problem with local anesthesia-tried to assist Physician   DM (diabetes mellitus) ([place])    Elevated liver enzymes    hx of, normal 03/2010   GERD (gastroesophageal reflux disease) 05/31/2011   History Schatzki's ring, erosive reflux esophagitis, status post last EGD and dilation 04/05/10    Glaucoma    lens in both eyes   Hemorrhoids 11/17/08   Colonoscopy Dr Laural Golden   Occipital neuralgia    PTSD (post-traumatic stress disorder)    Renal lithiasis    Seizure ([place])    12 yrs ago-med related   Torticollis    Unspecified musculoskeletal disorders and symptoms referable to neck    cervical/trapezius    Patient Active Problem List   Diagnosis Date Noted   Renal failure 12/30/2019   Lower extremity weakness 10/19/2019   Arthritis of right sacroiliac joint 08/18/2019   Altered bowel habits 08/03/2019   Trochanteric bursitis, left hip 07/20/2019   Uncontrolled type 2 diabetes mellitus with hyperglycemia, without long-term current use of insulin ([place]) 06/17/2019   PTSD (post-traumatic stress disorder) 10/15/2016   Encounter for central line placement 10/06/2015   Encounter for therapeutic drug level monitoring 10/02/2015   Occipital neuralgia (Location of Primary Source of Pain) (Bilateral) (L>R) 10/02/2015   Chronic cervical radicular pain (Left) 08/31/2015   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   Cervical facet arthropathy (severe  at C3-4) (Bilateral) (L>R) 07/26/2015   Cervical foraminal stenosis (C3-4) (Left) 07/26/2015   Cervical spondylosis 06/13/2015   Cervical facet syndrome (Location of Secondary source of pain) (Bilateral) (L>R) 06/13/2015   Chronic pain 06/13/2015   Cervicogenic headache (Location of Primary Source of Pain) (Bilateral) (L>R) 06/13/2015   Chronic neck pain (Location of Secondary source of pain) (Bilateral)  (L>R) 06/13/2015   Long term current use of opiate analgesic 06/13/2015   Opiate use (600 MME/Day) 06/13/2015   Opiate dependence ([place]) 06/13/2015   Myofascial muscle pain 04/19/2013   Dyspnea 02/10/2013   Intractable chronic migraine without aura and with status migrainosus 04/22/2012   Depression 04/22/2012   Borderline personality disorder ([place]) 04/22/2012   GERD with esophagitis 05/31/2011    Past Surgical History:  Procedure Laterality Date   BREAST LUMPECTOMY     left   CATARACT EXTRACTION W/PHACO Left 10/07/2016   Procedure: CATARACT EXTRACTION PHACO AND INTRAOCULAR LENS PLACEMENT (IOC);  Surgeon: Tonny Branch, MD;  Location: [facility];  Service: Ophthalmology;  Laterality: Left;  CDE: 4.98   CATARACT EXTRACTION W/PHACO Right 10/21/2016   Procedure: CATARACT EXTRACTION PHACO AND INTRAOCULAR LENS PLACEMENT RIGHT EYE CDE - 6.24;  Surgeon: Tonny Branch, MD;  Location: [facility];  Service: Ophthalmology;  Laterality: Right;  right   CHOLECYSTECTOMY     COLONOSCOPY  11/17/08   ext hemorrhoids   CRANIOTOMY     secondary TBI   CYST EXCISION     left wrist   ESOPHAGOGASTRODUODENOSCOPY  04/05/10   [name] (53F),erosive reflux esophagitis, small hiatal hernia,   ESOPHAGOGASTRODUODENOSCOPY (EGD) WITH PROPOFOL N/A 08/30/2019   Procedure: ESOPHAGOGASTRODUODENOSCOPY (EGD) WITH PROPOFOL;  Surgeon: Daneil Dolin, MD;  Location: [facility];  Service: Endoscopy;  Laterality: N/A;  3:30pm - pt knows to arrive at 9:45   [place] LITHOTRIPSY Left 12/13/2019   Procedure: EXTRACORPOREAL SHOCK WAVE LITHOTRIPSY (ESWL);  Surgeon: Festus Aloe, MD;  Location: [facility];  Service: Urology;  Laterality: Left;   FINGER SURGERY     removal cyst left pinky   gastro empy study  03/03/10   normal   HAND SURGERY     MALONEY DILATION N/A 08/30/2019   Procedure: Venia Minks DILATION;  Surgeon: Daneil Dolin, MD;  Location: [facility];  Service:  Endoscopy;  Laterality: N/A;   NECK SURGERY  1998   NEPHROLITHOTOMY  05/13/2012   Procedure: NEPHROLITHOTOMY PERCUTANEOUS;  Surgeon: Franchot Gallo, MD;  Location: [facility];  Service: Urology;  Laterality: Left;        OB History   No obstetric history on file.     Family History  Problem Relation Age of Onset   Colon cancer Mother 55   Heart disease Father    Diabetes Father    Diabetes Paternal Grandmother    Heart disease Paternal Grandfather    Diverticulitis Daughter     Social History   Tobacco Use   Smoking status: Never Smoker   Smokeless tobacco: Never Used  Substance Use Topics   Alcohol use: No   Drug use: No    Home Medications Prior to Admission medications   Medication Sig Start Date End Date Taking? Authorizing Provider  atorvastatin (LIPITOR) 10 MG tablet Take 1 tablet (10 mg total) by mouth daily. 10/05/19  Yes [name] F, FNP  cephALEXin (KEFLEX) 500 MG capsule Take 500 mg by mouth 2 (two) times daily. 12/29/19  Yes [provider]  glipiZIDE (GLUCOTROL) 5 MG tablet Take 1  tablet (5 mg total) by mouth daily before breakfast. 10/25/19  Yes [name] F, FNP  ibuprofen (ADVIL) 200 MG tablet Take 800 mg by mouth every 8 (eight) hours as needed for moderate pain.   Yes [provider]  metFORMIN (GLUCOPHAGE) 500 MG tablet Take 2 tablets (1,000 mg total) by mouth 2 (two) times daily. 10/05/19  Yes [name] F, FNP  methadone (DOLOPHINE) 10 MG tablet Take 2 tablets (20 mg total) by mouth 3 (three) times daily. 12/15/19  Yes Meredith Staggers, MD  naproxen (NAPROSYN) 500 MG tablet Take 500 mg by mouth 2 (two) times daily. 12/16/19  Yes [provider]  nortriptyline (PAMELOR) 25 MG capsule Take 1 capsule (25 mg total) by mouth at bedtime. 12/15/19  Yes Meredith Staggers, MD  ondansetron (ZOFRAN) 4 MG tablet Take 1 tablet (4 mg total) by mouth every 8 (eight) hours as needed for nausea or vomiting. 12/07/19  Yes Triplett,  Tammy, PA-C  tapentadol (NUCYNTA) 50 MG tablet Take 1 tablet (50 mg total) by mouth daily as needed (migraines). 10/13/19  Yes Meredith Staggers, MD  tiZANidine (ZANAFLEX) 2 MG tablet TAKE (1) TABLET EVERY SIX HOURS AS NEEDED FOR MUSCLE SPASMS. Patient taking differently: Take 2 mg by mouth every 6 (six) hours as needed for muscle spasms.  11/25/19  Yes Meredith Staggers, MD  topiramate (TOPAMAX) 50 MG tablet TAKE 2 TABLETS IN THE MORNING AND 4 TABLETS AT BEDTIME. Patient taking differently: Take 100-200 mg by mouth See admin instructions. TAKE 2 TABLETS IN THE MORNING AND 4 TABLETS AT BEDTIME. 10/19/19  Yes Meredith Staggers, MD  blood glucose meter kit and supplies KIT Dispense based on patient and insurance preference. Use up to four times daily as directed. (FOR ICD-9 250.00, 250.01). Patient not taking: Reported on 12/07/2019 11/18/19   Loman Brooklyn, FNP  ondansetron (ZOFRAN) 4 MG tablet Take 1 tablet (4 mg total) by mouth every 6 (six) hours. As needed for nausea vomiting Patient not taking: Reported on 12/30/2019 12/07/19   Kem Parkinson, PA-C    Allergies    Aspirin, Hydromorphone hcl, Vimpat [lacosamide], Capsaicin, Depakote [divalproex sodium], Milnacipran hcl, Pristiq [desvenlafaxine succinate monohydrate], and Gabapentin  Review of Systems   Review of Systems  Gastrointestinal: Positive for abdominal pain.  Neurological: Positive for weakness.  All other systems reviewed and are negative.   Physical Exam Updated Vital Signs BP (!) 130/106 (BP Location: Left Arm)    Pulse (!) 111    Temp 97.7 F (36.5 C) (Oral)    Resp 12    Ht _0  (1.626 m)    Wt 62.6 kg    SpO2 100%    BMI 23.69 kg/m   Physical Exam Vitals and nursing note reviewed.  Constitutional:      Appearance: She is well-developed. She is ill-appearing.     Comments: Chronically ill appearing. Appears older than stated age. Disheveled  HENT:     Head: Normocephalic and atraumatic.  Cardiovascular:     Rate and  Rhythm: Regular rhythm. Tachycardia present.     Heart sounds: No murmur.  Pulmonary:     Effort: Pulmonary effort is normal. No respiratory distress.     Breath sounds: Normal breath sounds.  Abdominal:     Palpations: Abdomen is soft.     Tenderness: There is no abdominal tenderness. There is no guarding or rebound.  Musculoskeletal:        General: No tenderness.  Comments: 2+ DP pulses bilaterally. 2+ pitting edema to bilateral lower extremities. There is ecchymosis to the right fourth toe  Skin:    General: Skin is warm and dry.     Coloration: Skin is pale.  Neurological:     Mental Status: She is alert.     Comments: Slow to answer questions. Only answers with 2 to 3 words to questions. Difficulty following basic commands. 4/5 strength in bilateral upper extremities. 2/5 strength in the right lower extremity, 4/5 strength in the left lower extremity. Difficult to ascertain sensation as patient does not consistently answer questions.  Psychiatric:     Comments: Poor eye contact. Low volume speech.     ED Results / Procedures / Treatments   Labs (all labs ordered are listed, but only abnormal results are displayed) Labs Reviewed  BASIC METABOLIC PANEL - Abnormal; Notable for the following components:      Result Value   Sodium 133 (*)    Chloride 97 (*)    CO2 10 (*)    Glucose, Bld 163 (*)    BUN 165 (*)    Creatinine, Ser 7.15 (*)    Calcium 7.8 (*)    GFR calc non Af Amer 5 (*)    GFR calc Af Amer 6 (*)    Anion gap 26 (*)    All other components within normal limits  CBC - Abnormal; Notable for the following components:   WBC 11.1 (*)    Platelets 146 (*)    All other components within normal limits  URINALYSIS, ROUTINE W REFLEX MICROSCOPIC - Abnormal; Notable for the following components:   APPearance HAZY (*)    Glucose, UA 50 (*)    Hgb urine dipstick LARGE (*)    Protein, ur 100 (*)    Leukocytes,Ua SMALL (*)    RBC / HPF >50 (*)    Bacteria, UA RARE  (*)    All other components within normal limits  HEPATIC FUNCTION PANEL - Abnormal; Notable for the following components:   AST 6 (*)    Total Bilirubin 1.7 (*)    All other components within normal limits  LACTIC ACID, PLASMA - Abnormal; Notable for the following components:   Lactic Acid, Venous 2.7 (*)    All other components within normal limits  LACTIC ACID, PLASMA - Abnormal; Notable for the following components:   Lactic Acid, Venous 2.4 (*)    All other components within normal limits  RAPID URINE DRUG SCREEN, HOSP PERFORMED - Abnormal; Notable for the following components:   Benzodiazepines POSITIVE (*)    All other components within normal limits  PROTIME-INR - Abnormal; Notable for the following components:   Prothrombin Time 16.6 (*)    INR 1.4 (*)    All other components within normal limits  MAGNESIUM - Abnormal; Notable for the following components:   Magnesium 1.5 (*)    All other components within normal limits  PHOSPHORUS - Abnormal; Notable for the following components:   Phosphorus 8.4 (*)    All other components within normal limits  RENAL FUNCTION PANEL - Abnormal; Notable for the following components:   Potassium 3.2 (*)    CO2 10 (*)    Glucose, Bld 59 (*)    BUN 160 (*)    Creatinine, Ser 6.37 (*)    Calcium 6.8 (*)    Phosphorus 7.0 (*)    Albumin 3.0 (*)    GFR calc non Af Amer 6 (*)  GFR calc Af Amer 7 (*)    Anion gap 23 (*)    All other components within normal limits  CBC - Abnormal; Notable for the following components:   RBC 3.62 (*)    Hemoglobin 10.8 (*)    HCT 31.4 (*)    Platelets 97 (*)    All other components within normal limits  CBG MONITORING, ED - Abnormal; Notable for the following components:   Glucose-Capillary 146 (*)    All other components within normal limits  POCT I-STAT EG7 - Abnormal; Notable for the following components:   pH, Ven 7.162 (*)    pCO2, Ven 20.0 (*)    pO2, Ven 87.0 (*)    Bicarbonate 7.2 (*)     TCO2 8 (*)    Acid-base deficit 20.0 (*)    Sodium 132 (*)    Calcium, Ion 0.92 (*)    All other components within normal limits  SARS CORONAVIRUS 2 BY RT PCR (HOSPITAL ORDER, [facility])  CULTURE, BLOOD (ROUTINE X 2)  CULTURE, BLOOD (ROUTINE X 2)  URINE CULTURE  ETHANOL  AMMONIA  CK  VITAMIN B12  HIV ANTIBODY (ROUTINE TESTING W REFLEX)  UPEP/UIFE/LIGHT CHAINS/TP, 24-HR UR  PROTEIN ELECTROPHORESIS, SERUM  PROTEIN / CREATININE RATIO, URINE  C3 COMPLEMENT  C4 COMPLEMENT  MPO/PR-3 (ANCA) ANTIBODIES  ANA  RENAL FUNCTION PANEL  CBC  BASIC METABOLIC PANEL  MAGNESIUM  PHOSPHORUS  HEMOGLOBIN A1C  CBG MONITORING, ED  I-STAT VENOUS BLOOD GAS, ED  TROPONIN I (HIGH SENSITIVITY)    EKG EKG Interpretation  Date/Time:  Thursday Dec 30 2019 09:36:30 EDT Ventricular Rate:  112 PR Interval:  152 QRS Duration: 82 QT Interval:  316 QTC Calculation: 431 R Axis:   64 Text Interpretation: Sinus tachycardia with Premature atrial complexes Low voltage QRS Borderline ECG Confirmed by Quintella Reichert [id number]) on 12/30/2019 10:53:46 AM   Radiology CT Head Wo Contrast  Result Date: 12/30/2019 CLINICAL DATA:  Altered mental status EXAM: CT HEAD WITHOUT CONTRAST TECHNIQUE: Contiguous axial images were obtained from the base of the skull through the vertex without intravenous contrast. COMPARISON:  None. FINDINGS: Brain: There is no acute intracranial hemorrhage, mass effect, or edema. Gray-white differentiation is preserved. There is no extra-axial fluid collection. Ventricles and sulci are within normal limits in size and configuration. Vascular: There is atherosclerotic calcification at the skull base. Skull: Postoperative changes of prior bifrontal craniotomy. Sinuses/Orbits: No acute finding. Other: None. IMPRESSION: No acute intracranial abnormality. Electronically Signed   By: Macy Mis M.D.   On: 12/30/2019 13:09   CT Cervical Spine Wo Contrast  Result Date:  12/30/2019 CLINICAL DATA:  Progressive altered mental status, generalized weakness EXAM: CT CERVICAL SPINE WITHOUT CONTRAST TECHNIQUE: Multidetector CT imaging of the cervical spine was performed without intravenous contrast. Multiplanar CT image reconstructions were also generated. COMPARISON:  None. FINDINGS: Alignment: No significant listhesis. Skull base and vertebrae: There are postoperative changes of prior anterior fusion at C5-C7 with plate and screw fixation and well incorporated interbody grafts. No evidence of loosening. Vertebral body heights are maintained. Soft tissues and spinal canal: No prevertebral soft tissue swelling or visible epidural collection. Disc levels: There are bridging posterior endplate osteophytes at the operative levels and at C3-C4 and C4-C5. Multilevel uncovertebral and facet hypertrophy. There is fusion of the multiple left facets. There is no significant osseous encroachment on the spinal canal. Foraminal stenosis is greatest on the left at C3-C4 and C4-C5. Upper chest: Negative. Other:  None. IMPRESSION: Postoperative changes at C5 and C7 without evidence of complication. No high-grade canal stenosis. Electronically Signed   By: Macy Mis M.D.   On: 12/30/2019 13:22   DG Chest Port 1 View  Result Date: 12/30/2019 CLINICAL DATA:  Central catheter placement EXAM: PORTABLE CHEST 1 VIEW COMPARISON:  Dec 30, 2019 study obtained earlier in the day FINDINGS: Central catheter tip is in the superior vena cava. No pneumothorax. The lungs are clear. Heart size and pulmonary vascularity are normal. No adenopathy. 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 There is postoperative change in the lower cervical spine. IMPRESSION: Central catheter tip in superior vena cava. No pneumothorax. Lungs clear. Cardiac silhouette normal. 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 Electronically Signed   By: Lowella Grip III M.D.   On: 12/30/2019 16:02   DG Chest Port 1  View  Result Date: 12/30/2019 CLINICAL DATA:  Renal failure EXAM: PORTABLE CHEST 1 VIEW COMPARISON:  11/18/2019 FINDINGS: Cardiac shadows within normal limits. Lungs are well aerated bilaterally. No focal infiltrate or sizable effusion is seen. Postsurgical changes in the cervical spine are noted. Multiple calcified loose bodies are noted within the left shoulder joint. IMPRESSION: No acute abnormality noted. Electronically Signed   By: Inez Catalina M.D.   On: 12/30/2019 12:12   CT Renal Stone Study  Result Date: 12/30/2019 CLINICAL DATA:  Altered mental status, history of renal stone EXAM: CT ABDOMEN AND PELVIS WITHOUT CONTRAST TECHNIQUE: Multidetector CT imaging of the abdomen and pelvis was performed following the standard protocol without IV contrast. COMPARISON:  12/07/2019 FINDINGS: Lower chest: No acute abnormality. Hepatobiliary: No focal liver abnormality is seen. Status post cholecystectomy. No biliary dilatation. Pancreas: Unremarkable. Spleen: Unremarkable. Adrenals/Urinary Tract: The adrenals are unremarkable. Unchanged nonobstructing left renal calculi measuring up to 9 mm at the lower pole. Resolution of hydronephrosis. An 11 x 3 mm calculus is now present within the distal left ureter approximately 6 cm above the ureterovesical junction. Bladder is unremarkable. Stomach/Bowel: Hiatal hernia is again noted. Bowel is normal in caliber. Vascular/Lymphatic: Aortic atherosclerosis. No enlarged abdominal or pelvic lymph nodes. Reproductive: Uterus and bilateral adnexa are unremarkable. Other: No ascites. Musculoskeletal: No acute osseous abnormality IMPRESSION: 11 x 3 mm calculus within the distal left ureter approximately 6 cm above the UVJ. Resolution of proximal hydroureteronephrosis. Unchanged nonobstructing left renal calculi. Hiatal hernia. Electronically Signed   By: Macy Mis M.D.   On: 12/30/2019 13:16    Procedures Procedures (including critical care time) CRITICAL CARE Performed  by: Quintella Reichert   Total critical care time: 45 minutes  Critical care time was exclusive of separately billable procedures and treating other patients.  Critical care was necessary to treat or prevent imminent or life-threatening deterioration.  Critical care was time spent personally by me on the following activities: development of treatment plan with patient and/or surrogate as well as nursing, discussions with consultants, evaluation of patient's response to treatment, examination of patient, obtaining history from patient or surrogate, ordering and performing treatments and interventions, ordering and review of laboratory studies, ordering and review of radiographic studies, pulse oximetry and re-evaluation of patient's condition.  Medications Ordered in ED Medications  sodium bicarbonate 150 mEq in sterile water 1,000 mL infusion ( Intravenous New Bag/Given 12/30/19 1619)  Chlorhexidine Gluconate Cloth 2 % PADS 6 each (has no administration in time range)  Chlorhexidine Gluconate Cloth 2 % PADS 6 each (has no administration in time range)  heparin sodium (porcine) injection 3,000 Units (0 Units Intravenous  Hold 12/30/19 1706)  docusate sodium (COLACE) capsule 100 mg (has no administration in time range)  polyethylene glycol (MIRALAX / GLYCOLAX) packet 17 g (has no administration in time range)  heparin injection 5,000 Units (5,000 Units Subcutaneous Given 12/30/19 1704)  potassium chloride SA (KLOR-CON) CR tablet 20 mEq (has no administration in time range)  sodium chloride flush (NS) 0.9 % injection 3 mL (3 mLs Intravenous Given 12/30/19 1328)  sodium chloride 0.9 % bolus 1,000 mL (0 mLs Intravenous Stopped 12/30/19 1557)  sodium bicarbonate injection 50 mEq (50 mEq Intravenous Given 12/30/19 1445)  cefTRIAXone (ROCEPHIN) 1 g in sodium chloride 0.9 % 100 mL IVPB (0 g Intravenous Stopped 12/30/19 1557)  lactated ringers bolus 1,000 mL (1,000 mLs Intravenous New Bag/Given 12/30/19 1620)     ED Course  I have reviewed the triage vital signs and the nursing notes.  Pertinent labs & imaging results that were available during my care of the patient were reviewed by me and considered in my medical decision making (see chart for details).    MDM Rules/Calculators/A&P                     Patient with history of diabetes, chronic pain here for evaluation of progressive weakness, pain and altered mental status. She is ill appearing on evaluation with generalized weakness, lower extremities greater than upper as well as significant confusion. Labs are significant for acute renal failure with uremia. She was treated with gentle IV fluid hydration. Discussed with Dr. Royce Macadamia with nephrology, who evaluated the patient in the emergency department. CT scan does demonstrate a renal stone but no evidence of obstruction. Urinalysis is concerning for UTI and she was recently started on antibiotics, will give her Rocephin. Lactic acid is mildly elevated, feel that this is more related to underlying renal disease over sepsis. Critical care consulted for admission for further management of severe metabolic acidosis and renal failure. Patient and husband updated of findings of studies recommendation for admission and they are in agreement with treatment plan.  Final Clinical Impression(s) / ED Diagnoses Final diagnoses:  Acute renal failure, unspecified acute renal failure type ([place])  Altered mental status, unspecified altered mental status type  Metabolic acidosis    Rx / DC Orders ED Discharge Orders    None       Quintella Reichert, MD 12/30/19 1711

## 2019-12-31 LAB — CBC
HCT: 30.3 % — ABNORMAL LOW (ref 36.0–46.0)
Hemoglobin: 11 g/dL — ABNORMAL LOW (ref 12.0–15.0)
MCH: 30.3 pg (ref 26.0–34.0)
MCHC: 36.3 g/dL — ABNORMAL HIGH (ref 30.0–36.0)
MCV: 83.5 fL (ref 80.0–100.0)
Platelets: 87 10*3/uL — ABNORMAL LOW (ref 150–400)
RBC: 3.63 MIL/uL — ABNORMAL LOW (ref 3.87–5.11)
RDW: 13.6 % (ref 11.5–15.5)
WBC: 8.7 10*3/uL (ref 4.0–10.5)
nRBC: 0 % (ref 0.0–0.2)

## 2019-12-31 LAB — PROTEIN ELECTROPHORESIS, SERUM
A/G Ratio: 1.2 (ref 0.7–1.7)
Albumin ELP: 3.1 g/dL (ref 2.9–4.4)
Alpha-1-Globulin: 0.2 g/dL (ref 0.0–0.4)
Alpha-2-Globulin: 0.8 g/dL (ref 0.4–1.0)
Beta Globulin: 0.8 g/dL (ref 0.7–1.3)
Gamma Globulin: 0.7 g/dL (ref 0.4–1.8)
Globulin, Total: 2.5 g/dL (ref 2.2–3.9)
Total Protein ELP: 5.6 g/dL — ABNORMAL LOW (ref 6.0–8.5)

## 2019-12-31 LAB — MAGNESIUM
Magnesium: 1.1 mg/dL — ABNORMAL LOW (ref 1.7–2.4)
Magnesium: 2.4 mg/dL (ref 1.7–2.4)

## 2019-12-31 LAB — BASIC METABOLIC PANEL
Anion gap: 12 (ref 5–15)
BUN: 52 mg/dL — ABNORMAL HIGH (ref 8–23)
CO2: 20 mmol/L — ABNORMAL LOW (ref 22–32)
Calcium: 6.6 mg/dL — ABNORMAL LOW (ref 8.9–10.3)
Chloride: 101 mmol/L (ref 98–111)
Creatinine, Ser: 2.02 mg/dL — ABNORMAL HIGH (ref 0.44–1.00)
GFR calc Af Amer: 29 mL/min — ABNORMAL LOW (ref >60)
GFR calc non Af Amer: 25 mL/min — ABNORMAL LOW (ref >60)
Glucose, Bld: 223 mg/dL — ABNORMAL HIGH (ref 70–99)
Potassium: 3.5 mmol/L (ref 3.5–5.1)
Sodium: 133 mmol/L — ABNORMAL LOW (ref 135–145)

## 2019-12-31 LAB — URINE CULTURE: Culture: NO GROWTH

## 2019-12-31 LAB — HEMOGLOBIN A1C
Hgb A1c MFr Bld: 9.4 % — ABNORMAL HIGH (ref 4.8–5.6)
Mean Plasma Glucose: 223.08 mg/dL

## 2019-12-31 LAB — RENAL FUNCTION PANEL
Albumin: 3.1 g/dL — ABNORMAL LOW (ref 3.5–5.0)
Anion gap: 16 — ABNORMAL HIGH (ref 5–15)
BUN: 58 mg/dL — ABNORMAL HIGH (ref 8–23)
CO2: 20 mmol/L — ABNORMAL LOW (ref 22–32)
Calcium: 7.3 mg/dL — ABNORMAL LOW (ref 8.9–10.3)
Chloride: 103 mmol/L (ref 98–111)
Creatinine, Ser: 2.41 mg/dL — ABNORMAL HIGH (ref 0.44–1.00)
GFR calc Af Amer: 23 mL/min — ABNORMAL LOW (ref >60)
GFR calc non Af Amer: 20 mL/min — ABNORMAL LOW (ref >60)
Glucose, Bld: 95 mg/dL (ref 70–99)
Phosphorus: 1.7 mg/dL — ABNORMAL LOW (ref 2.5–4.6)
Potassium: 2.9 mmol/L — ABNORMAL LOW (ref 3.5–5.1)
Sodium: 139 mmol/L (ref 135–145)

## 2019-12-31 LAB — C4 COMPLEMENT: Complement C4, Body Fluid: 28 mg/dL (ref 12–38)

## 2019-12-31 LAB — ANA: Anti Nuclear Antibody (ANA): NEGATIVE

## 2019-12-31 LAB — GLUCOSE, CAPILLARY
Glucose-Capillary: 74 mg/dL (ref 70–99)
Glucose-Capillary: 110 mg/dL — ABNORMAL HIGH (ref 70–99)
Glucose-Capillary: 210 mg/dL — ABNORMAL HIGH (ref 70–99)

## 2019-12-31 LAB — HEPATITIS B SURFACE ANTIGEN: Hepatitis B Surface Ag: NONREACTIVE

## 2019-12-31 LAB — HEPATITIS B CORE ANTIBODY, TOTAL: Hep B Core Total Ab: NONREACTIVE

## 2019-12-31 LAB — C3 COMPLEMENT: C3 Complement: 97 mg/dL (ref 82–167)

## 2019-12-31 LAB — HEPATITIS B SURFACE ANTIBODY,QUALITATIVE: Hep B S Ab: NONREACTIVE

## 2019-12-31 LAB — POTASSIUM: Potassium: 2.4 mmol/L — CL (ref 3.5–5.1)

## 2019-12-31 MED ORDER — POTASSIUM PHOSPHATES 15 MMOLE/5ML IV SOLN
30.0000 mmol | Freq: Once | INTRAVENOUS | Status: AC
  Administered 2019-12-31: 30 mmol via INTRAVENOUS
  Filled 2019-12-31: qty 10

## 2019-12-31 MED ORDER — SODIUM CHLORIDE 0.9 % IV SOLN: 100.0000 mL | INTRAVENOUS | Status: DC | PRN

## 2019-12-31 MED ORDER — MAGNESIUM SULFATE 2 GM/50ML IV SOLN
2.0000 g | Freq: Once | INTRAVENOUS | Status: AC
  Administered 2019-12-31: 2 g via INTRAVENOUS
  Filled 2019-12-31: qty 50

## 2019-12-31 MED ORDER — LIDOCAINE HCL (PF) 1 % IJ SOLN: 5.0000 mL | INTRAMUSCULAR | Status: DC | PRN

## 2019-12-31 MED ORDER — HEPARIN SODIUM (PORCINE) 1000 UNIT/ML DIALYSIS
1000.0000 [IU] | INTRAMUSCULAR | Status: DC | PRN
  Administered 2019-12-31: 2400 [IU] via INTRAVENOUS_CENTRAL
  Filled 2019-12-31: qty 1

## 2019-12-31 MED ORDER — PENTAFLUOROPROP-TETRAFLUOROETH EX AERO: 1.0000 "application " | INHALATION_SPRAY | CUTANEOUS | Status: DC | PRN

## 2019-12-31 MED ORDER — ALTEPLASE 2 MG IJ SOLR: 2.0000 mg | Freq: Once | INTRAMUSCULAR | Status: DC | PRN

## 2019-12-31 MED ORDER — POTASSIUM CHLORIDE 10 MEQ/50ML IV SOLN
10.0000 meq | INTRAVENOUS | Status: AC
  Administered 2019-12-31 (×6): 10 meq via INTRAVENOUS
  Filled 2019-12-31 (×6): qty 50

## 2019-12-31 MED ORDER — LIDOCAINE-PRILOCAINE 2.5-2.5 % EX CREA: 1.0000 "application " | TOPICAL_CREAM | CUTANEOUS | Status: DC | PRN

## 2019-12-31 MED ORDER — POTASSIUM CHLORIDE 10 MEQ/100ML IV SOLN: 10.0000 meq | INTRAVENOUS | Status: DC

## 2019-12-31 NOTE — Progress Notes (Signed)
CRITICAL VALUE ALERT  Critical Value:  Potasium 2.4  Date & Time Notied:  12/31/19 0120  Provider Notified: E-link  Orders Received/Actions taken: Orders

## 2019-12-31 NOTE — Plan of Care (Signed)

## 2019-12-31 NOTE — Progress Notes (Signed)
[facility] Progress Note   69 y.o. female with a history including chronic pain, chronic headaches, diabetes, GERD with erosive esophagitis, arthritis, nephrolithiasis, and diabetes who presented to the ER with altered mental status.  Per her husband, she has had a downward decline for the past several months since January.  2 weeks ago she had lithotripsy for stone and that she has been managed as an outpatient; worsening  mental status + anorexia + decr UOP. Heavy NSAID use for pain.   CT stone study with 11 x 3 mm calculus within the distal left ureter approximately 6 cm above the UVJ. Resolution of proximal hydroureteronephrosis.  Unchanged nonobstructing left renal calculi.  Assessment/ Plan:   # Acute kidney injury  - Multifactorial.  Recent prerenal insults with decreased p.o. intake as well as heavy NSAID use.  Appears history of obstructing stone which appears resolved per CT with residual nonobstructing stones reported.   - Tolerated HD treated which ended very early AM today (kept net even) - Would Assess dialysis needs daily thereafter; would like to hold off on HD today. - Please continue foley catheter  - UPC 2.1 - Repeat STAT BMet -> if needed HD later today vs tomorrow if needed.   # Metabolic acidosis - Setting of AKI.  LA mildly elevated.  Has been on metformin - Status post 1 amp of bicarb - Assess HD needs daily  # Altered mental status - Patient with uremia and acidosis - Started dialysis as above  # Nephrolithiasis  - Proximal hydro appears resolved per CT scan.  She has report of nonobstructing stones.  Reassess need for urology involvement   # UTI  - abx per primary team  - Urine culture NGT  # DM - noted.  Per primary team.  Normoglycemic.   # Hyperphosphatemia. initiating dialysis follow trends  Subjective:   Feels cold with chills but denies fevers/ nausea/ CP / SOB   Objective:   BP (!) 101/56 (BP Location: Left Arm)   Pulse  (!) 106   Temp 98.1 F (36.7 C) (Axillary)   Resp 16   Ht 5\' 4"  (1.626 m)   Wt 56.3 kg   SpO2 100%   BMI 21.30 kg/m   Intake/Output Summary (Last 24 hours) at 12/31/2019 1040 Last data filed at 12/31/2019 1000 Gross per 24 hour  Intake 1408.01 ml  Output 1075 ml  Net 333.01 ml   Weight change:   Physical Exam: General: Adult female in bed, oriented and pleasant HEENT: NCAT Heart: S1-S2 no rub Lungs: CTA B/L Abdomen: Soft nontender nondistended Extremities: Trace to 1+ edema lower extremities there is pain to touch.  Neuro: Patient  answers questions appropriately and  is  oriented.  Foley catheter in place   Imaging: CT Head Wo Contrast  Result Date: 12/30/2019 CLINICAL DATA:  Altered mental status EXAM: CT HEAD WITHOUT CONTRAST TECHNIQUE: Contiguous axial images were obtained from the base of the skull through the vertex without intravenous contrast. COMPARISON:  None. FINDINGS: Brain: There is no acute intracranial hemorrhage, mass effect, or edema. Gray-white differentiation is preserved. There is no extra-axial fluid collection. Ventricles and sulci are within normal limits in size and configuration. Vascular: There is atherosclerotic calcification at the skull base. Skull: Postoperative changes of prior bifrontal craniotomy. Sinuses/Orbits: No acute finding. Other: None. IMPRESSION: No acute intracranial abnormality. Electronically Signed   By: 01/01/2020 M.D.   On: 12/30/2019 13:09   CT Cervical Spine Wo Contrast  Result Date:  12/30/2019 CLINICAL DATA:  Progressive altered mental status, generalized weakness EXAM: CT CERVICAL SPINE WITHOUT CONTRAST TECHNIQUE: Multidetector CT imaging of the cervical spine was performed without intravenous contrast. Multiplanar CT image reconstructions were also generated. COMPARISON:  None. FINDINGS: Alignment: No significant listhesis. Skull base and vertebrae: There are postoperative changes of prior anterior fusion at C5-C7 with plate  and screw fixation and well incorporated interbody grafts. No evidence of loosening. Vertebral body heights are maintained. Soft tissues and spinal canal: No prevertebral soft tissue swelling or visible epidural collection. Disc levels: There are bridging posterior endplate osteophytes at the operative levels and at C3-C4 and C4-C5. Multilevel uncovertebral and facet hypertrophy. There is fusion of the multiple left facets. There is no significant osseous encroachment on the spinal canal. Foraminal stenosis is greatest on the left at C3-C4 and C4-C5. Upper chest: Negative. Other: None. IMPRESSION: Postoperative changes at C5 and C7 without evidence of complication. No high-grade canal stenosis. Electronically Signed   By: Macy Mis M.D.   On: 12/30/2019 13:22   US RENAL  Result Date: 12/30/2019 CLINICAL DATA:  History of nephrolithiasis.  Renal failure. EXAM: RENAL / URINARY TRACT ULTRASOUND COMPLETE COMPARISON:  None. FINDINGS: Right Kidney: Renal measurements: 10.3 x 4.7 x 5.2 cm = volume: 132 mL . Echogenicity within normal limits. No mass or hydronephrosis visualized. Left Kidney: Renal measurements: 10.7 x 5.2 x 6.3 cm = volume: 184 mL. 7 mm calculus near the left lower pole. No hydronephrosis. Normal echogenicity. Bladder: Decompressed by Foley catheter. Other: None. IMPRESSION: No hydronephrosis.  Nonobstructive left lower pole renal calculus. Electronically Signed   By: Ulyses Jarred M.D.   On: 12/30/2019 17:57   DG Chest Port 1 View  Result Date: 12/30/2019 CLINICAL DATA:  Central catheter placement EXAM: PORTABLE CHEST 1 VIEW COMPARISON:  Dec 30, 2019 study obtained earlier in the day FINDINGS: Central catheter tip is in the superior vena cava. No pneumothorax. The lungs are clear. Heart size and pulmonary vascularity are normal. No adenopathy. 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 There is postoperative change in the lower cervical spine. IMPRESSION: Central catheter tip in  superior vena cava. No pneumothorax. Lungs clear. Cardiac silhouette normal. 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 Electronically Signed   By: Lowella Grip III M.D.   On: 12/30/2019 16:02   DG Chest Port 1 View  Result Date: 12/30/2019 CLINICAL DATA:  Renal failure EXAM: PORTABLE CHEST 1 VIEW COMPARISON:  11/18/2019 FINDINGS: Cardiac shadows within normal limits. Lungs are well aerated bilaterally. No focal infiltrate or sizable effusion is seen. Postsurgical changes in the cervical spine are noted. Multiple calcified loose bodies are noted within the left shoulder joint. IMPRESSION: No acute abnormality noted. Electronically Signed   By: Inez Catalina M.D.   On: 12/30/2019 12:12   CT Renal Stone Study  Result Date: 12/30/2019 CLINICAL DATA:  Altered mental status, history of renal stone EXAM: CT ABDOMEN AND PELVIS WITHOUT CONTRAST TECHNIQUE: Multidetector CT imaging of the abdomen and pelvis was performed following the standard protocol without IV contrast. COMPARISON:  12/07/2019 FINDINGS: Lower chest: No acute abnormality. Hepatobiliary: No focal liver abnormality is seen. Status post cholecystectomy. No biliary dilatation. Pancreas: Unremarkable. Spleen: Unremarkable. Adrenals/Urinary Tract: The adrenals are unremarkable. Unchanged nonobstructing left renal calculi measuring up to 9 mm at the lower pole. Resolution of hydronephrosis. An 11 x 3 mm calculus is now present within the distal left ureter approximately 6 cm above the ureterovesical junction. Bladder is unremarkable. Stomach/Bowel: Hiatal hernia is  again noted. Bowel is normal in caliber. Vascular/Lymphatic: Aortic atherosclerosis. No enlarged abdominal or pelvic lymph nodes. Reproductive: Uterus and bilateral adnexa are unremarkable. Other: No ascites. Musculoskeletal: No acute osseous abnormality IMPRESSION: 11 x 3 mm calculus within the distal left ureter approximately 6 cm above the UVJ. Resolution of proximal hydroureteronephrosis.  Unchanged nonobstructing left renal calculi. Hiatal hernia. Electronically Signed   By: Guadlupe Spanish M.D.   On: 12/30/2019 13:16    Labs: BMET Recent Labs  Lab 12/30/19 0948 12/30/19 1217 12/30/19 1555 12/31/19 0040 12/31/19 0431  NA 133* 132* 137  --  139  K 3.8 4.1 3.2* 2.4* 2.9*  CL 97*  --  104  --  103  CO2 10*  --  10*  --  20*  GLUCOSE 163*  --  59*  --  95  BUN 165*  --  160*  --  58*  CREATININE 7.15*  --  6.37*  --  2.41*  CALCIUM 7.8*  --  6.8*  --  7.3*  PHOS 8.4*  --  7.0*  --  1.7*   CBC Recent Labs  Lab 12/30/19 0948 12/30/19 1217 12/30/19 1531 12/31/19 0431  WBC 11.1*  --  9.1 8.7  HGB 13.3 12.9 10.8* 11.0*  HCT 38.5 38.0 31.4* 30.3*  MCV 86.5  --  86.7 83.5  PLT 146*  --  97* 87*    Medications:    . Chlorhexidine Gluconate Cloth  6 each Topical Q0600  . Chlorhexidine Gluconate Cloth  6 each Topical Q0600  . heparin  5,000 Units Subcutaneous Q8H  . heparin sodium (porcine)  3,000 Units Intravenous STAT      Paulene Floor, MD 12/31/2019, 10:40 AM

## 2019-12-31 NOTE — Progress Notes (Signed)
NAME:  Monica [name], MRN:  [id number], DOB:  14-Jan-1951, LOS: 1 ADMISSION DATE:  12/30/2019, CONSULTATION DATE:  5/20 REFERRING MD:  rees, CHIEF COMPLAINT:  Acute metabolic encephalopathy, metabolic encephalopathy and AKI  Brief History    69 year old diabetic female presented 5/20 w/ new renal failure, metabolic acidosis and acute metabolic encephalopathy  History of present illness   Chronic sub acute pain, weakness, and paresthesia of LEs to point that she has required assist w/ walking and assist w/ ADLs. She had a recent dx of kidney for which she underwent lithotripsy. Since that point her PO intake as been poor, she has become weaker and not able to cary out conversation over last 24 hrs. She was brought to ER by husband.  In ER found to be lethargic, had sig metabolic acidosis (+ anion gap),  Bicarb 10, cr 7.15 (from nml cr of 0.9) Na 133, glucose 163, hgb 13.3, lactic acid 2.4, total CK 53. She was dx w/ new AKI. Seen by nephrology w/ plan for urgent HD. PCCM asked to admit    Past Medical History  Ptsd, occipital neuralgia, renal lithiasis, gerd, glaucoma, DM, head aches. Currently being worked up by neurology for yet to be defined progressive LE weakness and paresthesia of the lower extremities  Significant Hospital Events   5/20 admitted  Consults:  Nephrology   Procedures:  Right IJ CVL HD cath 5/20>>>  Significant Diagnostic Tests:  Bed side ECHO 5/2p0: collapsed IVC   Micro Data:  SARS Cov2 5/20> neg BCx 5/20>> UC 5/20>>>  Antimicrobials:    Interim history/subjective:  Hypoglycemic, hypokalemic overnight. Replaced  Cr down-trending, now 2.14 Being set up for iHD now   Objective   Blood pressure (!) 101/56, pulse (!) 106, temperature 98.1 F (36.7 C), temperature source Axillary, resp. rate 16, height '5\' 4"'$  (1.626 m), weight 56.3 kg, SpO2 100 %.        Intake/Output Summary (Last 24 hours) at 12/31/2019 0950 Last data filed at 12/31/2019 0800 Gross per  24 hour  Intake 1408.01 ml  Output 900 ml  Net 508.01 ml   Filed Weights   12/30/19 0933 12/31/19 0500  Weight: 62.6 kg 56.3 kg    Examination: General: Chronically ill appearing frail F, appears older than stated age.   HENT: pink mmm NCAT. R IJ HD cath  Lungs: CTA symmetrical chest expansion, no accessory muscle use on RA  Cardiovascular: RRR s1s2 no rgm. Cap refill < 3 seconds Abdomen: soft flat ndnt  Extremities: No obvious joint deformity. No cyanosis or clubbing. No BLE edema  Neuro: AAOx4 following commands. Diffuse myalgia  GU: blood tinged urine  Resolved Hospital Problem list     Assessment & Plan:   Acute renal failure, improving  Suspect multifactorial (recent kidney stone s/p lithotripsy w/ imaging today not showing any obstruction), dehydration and NSAIDs lending to significant prerenal component, although suspect degree of ATN as well  Nephrolithiasis (now non-obstructive) Plan Nephrology following, HD 5/21 Continue IVF, cont bicarb  No NSAIDs Avoid nephrotoxins  Trend met panels   Possible UTI Plan UCx pending   Acute anion gap metabolic acidosis, improving 2/2 aki,  also has mild lactic acidosis Plan HD  Bicarb gtt Supportive care  Hyponatremia, improving Hyperphosphatemia, improving Hypomagnesemia, improving  Hypokalemia Plan Replace PRN  Trend BMP   Acute metabolic encephalopathy 2/2 AKI and acidosis, improving  Plan Supportive care Treat above  DM, poorly controlled with a1c 9.4 Plan SSI  Thrombocytopenia -possibly in  setting of acute renal failure -timing does not align with type 2 HIT  -plt 146 to 87 Plan Trend CBC Continue heparin  Chronic lower extremity weakness and paresthesia Debilitation -husband concerned that debility is progressive  Plan Supportive care Being worked up in out pt setting  PT consult   Best practice:  Diet: renal Pain/Anxiety/Delirium protocol (if indicated): NA VAP protocol (if indicated):  NA DVT prophylaxis: [place] heparin  GI prophylaxis: NA  Glucose control: ssi Mobility: bedrest Code Status: full  Family Communication: pt and husband updated at bedside  Disposition: plan to transfer out of ICU to SDU after HD   Labs   CBC: Recent Labs  Lab 12/30/19 0948 12/30/19 1217 12/30/19 1531 12/31/19 0431  WBC 11.1*  --  9.1 8.7  HGB 13.3 12.9 10.8* 11.0*  HCT 38.5 38.0 31.4* 30.3*  MCV 86.5  --  86.7 83.5  PLT 146*  --  97* 87*    Basic Metabolic Panel: Recent Labs  Lab 12/30/19 0948 12/30/19 1217 12/30/19 1555 12/31/19 0040 12/31/19 0431  NA 133* 132* 137  --  139  K 3.8 4.1 3.2* 2.4* 2.9*  CL 97*  --  104  --  103  CO2 10*  --  10*  --  20*  GLUCOSE 163*  --  59*  --  95  BUN 165*  --  160*  --  58*  CREATININE 7.15*  --  6.37*  --  2.41*  CALCIUM 7.8*  --  6.8*  --  7.3*  MG 1.5*  --   --  1.1* 2.4  PHOS 8.4*  --  7.0*  --  1.7*   GFR: Estimated Creatinine Clearance: 19.6 mL/min (A) (by C-G formula based on SCr of 2.41 mg/dL (H)). Recent Labs  Lab 12/30/19 0948 12/30/19 1130 12/30/19 1330 12/30/19 1531 12/31/19 0431  WBC 11.1*  --   --  9.1 8.7  LATICACIDVEN  --  2.7* 2.4*  --   --     Liver Function Tests: Recent Labs  Lab 12/30/19 1111 12/30/19 1555 12/31/19 0431  AST 6*  --   --   ALT 13  --   --   ALKPHOS 94  --   --   BILITOT 1.7*  --   --   PROT 7.1  --   --   ALBUMIN 3.8 3.0* 3.1*   No results for input(s): LIPASE, AMYLASE in the last 168 hours. Recent Labs  Lab 12/30/19 1046  AMMONIA 29    ABG    Component Value Date/Time   HCO3 7.2 (L) 12/30/2019 1217   TCO2 8 (L) 12/30/2019 1217   ACIDBASEDEF 20.0 (H) 12/30/2019 1217   O2SAT 94.0 12/30/2019 1217     Coagulation Profile: Recent Labs  Lab 12/30/19 1207  INR 1.4*    Cardiac Enzymes: Recent Labs  Lab 12/30/19 1333  CKTOTAL 53    HbA1C: HB A1C (BAYER DCA - WAIVED)  Date/Time Value Ref Range Status  10/22/2019 10:12 AM 9.9 (H) <7.0 % Final    Comment:                                            Diabetic Adult            <7.0  Healthy Adult        4.3 - 5.7                                                           (DCCT/NGSP) American Diabetes Association's Summary of Glycemic Recommendations for Adults with Diabetes: Hemoglobin A1c <7.0%. More stringent glycemic goals (A1c <6.0%) may further reduce complications at the cost of increased risk of hypoglycemia.   06/29/2019 01:27 PM >14.0 (H) <7.0 % Final    Comment:                                          Diabetic Adult            <7.0                                       Healthy Adult        4.3 - 5.7                                                           (DCCT/NGSP) American Diabetes Association's Summary of Glycemic Recommendations for Adults with Diabetes: Hemoglobin A1c <7.0%. More stringent glycemic goals (A1c <6.0%) may further reduce complications at the cost of increased risk of hypoglycemia.    Hgb A1c MFr Bld  Date/Time Value Ref Range Status  12/31/2019 04:33 AM 9.4 (H) 4.8 - 5.6 % Final    Comment:    (NOTE) Pre diabetes:          5.7%-6.4% Diabetes:              >6.4% Glycemic control for   <7.0% adults with diabetes     CBG: Recent Labs  Lab 12/30/19 1850 12/30/19 1947 12/30/19 2317 12/31/19 0331 12/31/19 0742  GLUCAP 45* 150* 93 74 110*    Critical care time: [place] MSN, AGACNP-BC [place] [phone] If no answer, [phone] 12/31/2019, 9:50 AM

## 2019-12-31 NOTE — Progress Notes (Signed)
Phos 1.7, K- 2.9- replaced per e link electrolyte protocol.

## 2019-12-31 NOTE — Progress Notes (Signed)
eLink Physician-Brief Progress Note Patient Name: Monica [name] DOB: [birth year] MRN: [id number]   Date of Service  12/31/2019  HPI/Events of Note  Multiple issues: 1. Hypokalemia - K+ = 2.4 and 2. Hypomagnesemia - Mg++ = 1.1.  Creatinine = 6.37.  eICU Interventions  Will replace Mg++ and K+.      Intervention Category Major Interventions: Electrolyte abnormality - evaluation and management  Marcey Persad [place] 12/31/2019, 1:36 AM

## 2020-01-01 LAB — GLUCOSE, CAPILLARY: Glucose-Capillary: 441 mg/dL — ABNORMAL HIGH (ref 70–99)

## 2020-01-01 LAB — BASIC METABOLIC PANEL
Anion gap: 12 (ref 5–15)
Anion gap: 9 (ref 5–15)
BUN: 32 mg/dL — ABNORMAL HIGH (ref 8–23)
BUN: 25 mg/dL — ABNORMAL HIGH (ref 8–23)
CO2: 21 mmol/L — ABNORMAL LOW (ref 22–32)
CO2: 24 mmol/L (ref 22–32)
Calcium: 7 mg/dL — ABNORMAL LOW (ref 8.9–10.3)
Calcium: 7.3 mg/dL — ABNORMAL LOW (ref 8.9–10.3)
Chloride: 107 mmol/L (ref 98–111)
Chloride: 103 mmol/L (ref 98–111)
Creatinine, Ser: 1.28 mg/dL — ABNORMAL HIGH (ref 0.44–1.00)
Creatinine, Ser: 1.13 mg/dL — ABNORMAL HIGH (ref 0.44–1.00)
GFR calc Af Amer: 50 mL/min — ABNORMAL LOW (ref >60)
GFR calc Af Amer: 58 mL/min — ABNORMAL LOW (ref >60)
GFR calc non Af Amer: 43 mL/min — ABNORMAL LOW (ref >60)
GFR calc non Af Amer: 50 mL/min — ABNORMAL LOW (ref >60)
Glucose, Bld: 255 mg/dL — ABNORMAL HIGH (ref 70–99)
Glucose, Bld: 440 mg/dL — ABNORMAL HIGH (ref 70–99)
Potassium: 2.7 mmol/L — CL (ref 3.5–5.1)
Potassium: 4 mmol/L (ref 3.5–5.1)
Sodium: 140 mmol/L (ref 135–145)
Sodium: 136 mmol/L (ref 135–145)

## 2020-01-01 LAB — PHOSPHORUS: Phosphorus: 1.9 mg/dL — ABNORMAL LOW (ref 2.5–4.6)

## 2020-01-01 LAB — MAGNESIUM: Magnesium: 1.4 mg/dL — ABNORMAL LOW (ref 1.7–2.4)

## 2020-01-01 MED ORDER — ATORVASTATIN CALCIUM 10 MG PO TABS
10.0000 mg | ORAL_TABLET | Freq: Every day | ORAL | Status: DC
  Administered 2020-01-01 – 2020-01-03 (×3): 10 mg via ORAL
  Filled 2020-01-01 (×3): qty 1

## 2020-01-01 MED ORDER — POTASSIUM CHLORIDE CRYS ER 20 MEQ PO TBCR
40.0000 meq | EXTENDED_RELEASE_TABLET | ORAL | Status: AC
  Administered 2020-01-01 (×2): 40 meq via ORAL
  Filled 2020-01-01 (×2): qty 2

## 2020-01-01 MED ORDER — MAGNESIUM SULFATE 2 GM/50ML IV SOLN
2.0000 g | Freq: Once | INTRAVENOUS | Status: AC
  Administered 2020-01-01: 2 g via INTRAVENOUS
  Filled 2020-01-01: qty 50

## 2020-01-01 MED ORDER — TOPIRAMATE 25 MG PO TABS
50.0000 mg | ORAL_TABLET | Freq: Two times a day (BID) | ORAL | Status: DC
  Administered 2020-01-01 – 2020-01-03 (×4): 50 mg via ORAL
  Filled 2020-01-01 (×4): qty 2

## 2020-01-01 MED ORDER — INSULIN ASPART 100 UNIT/ML ~~LOC~~ SOLN
0.0000 [IU] | Freq: Three times a day (TID) | SUBCUTANEOUS | Status: DC
  Administered 2020-01-02: 3 [IU] via SUBCUTANEOUS
  Administered 2020-01-02: 5 [IU] via SUBCUTANEOUS
  Administered 2020-01-02: 7 [IU] via SUBCUTANEOUS
  Administered 2020-01-03 (×2): 3 [IU] via SUBCUTANEOUS

## 2020-01-01 MED ORDER — METHADONE HCL 10 MG PO TABS
10.0000 mg | ORAL_TABLET | Freq: Three times a day (TID) | ORAL | Status: DC
  Administered 2020-01-01 – 2020-01-02 (×4): 10 mg via ORAL
  Filled 2020-01-01 (×4): qty 1

## 2020-01-01 MED ORDER — NORTRIPTYLINE HCL 10 MG PO CAPS
10.0000 mg | ORAL_CAPSULE | Freq: Every day | ORAL | Status: DC
  Administered 2020-01-01 – 2020-01-02 (×2): 10 mg via ORAL
  Filled 2020-01-01 (×3): qty 1

## 2020-01-01 MED ORDER — INSULIN ASPART 100 UNIT/ML ~~LOC~~ SOLN
0.0000 [IU] | Freq: Every day | SUBCUTANEOUS | Status: DC
  Administered 2020-01-02: 4 [IU] via SUBCUTANEOUS

## 2020-01-01 MED ORDER — TIZANIDINE HCL 2 MG PO TABS
2.0000 mg | ORAL_TABLET | Freq: Every day | ORAL | Status: DC | PRN
  Administered 2020-01-02: 2 mg via ORAL
  Filled 2020-01-01 (×3): qty 1

## 2020-01-01 MED ORDER — INSULIN ASPART 100 UNIT/ML ~~LOC~~ SOLN
6.0000 [IU] | Freq: Once | SUBCUTANEOUS | Status: AC
  Administered 2020-01-01: 6 [IU] via SUBCUTANEOUS

## 2020-01-01 NOTE — Progress Notes (Addendum)
[facility] Progress Note   69 y.o.femalewith a historyincludingchronic pain,chronic headaches,diabetes, GERD witherosive esophagitis, arthritis, nephrolithiasis, anddiabetes who presented to the ER with altered mental status. Per her husband, she has had a downward decline for the past several months since January.  2 weeks ago she had lithotripsy for stone and that she has been managed as an outpatient; worsening  mental status+ anorexia + decr UOP. Heavy NSAID use for pain.   CT stone study with 11 x 3 mm calculus within the distal left ureter approximately 6 cm above the UVJ. Resolution of proximal hydroureteronephrosis.Unchanged nonobstructing left renal calculi.   Assessment/ Plan:   #Acute kidney injury  -Multifactorial. Recentprerenal insults with decreased p.o. intake as well as heavy NSAID use. Appears history of obstructing stonewhich appears resolved per CT with residual nonobstructing stones reported.  - Tolerated HD treated which ended very early AM today (kept net even) -Would Assess dialysis needs daily thereafter; would like to hold off on HD today. - Will  discontinue foley catheter per pt request especially as renal function improving. - UPC 2.1 - Does not appear she will need  Anymore HD -> plan on removing cath  tomorrow AM.  Called back at ~3PM 5/23 for bleeding from the RIJ temp cath. I removed it in it's entirety with hemostasis easily obtained with 5 min of gentle focal pressure. I then placed a pressure bandage on.  Will sign off at this time; please reconsult as needed.  - Suspicious for prerenal state causing the acute component as this is too rapid recovery for ATN.  #Metabolic acidosis - Setting of AKI. LA mildly elevated. Has been on metformin -Status post 1 amp of bicarb  #Altered mental status -Patient with uremia and acidosis -Started dialysis as above  #Nephrolithiasis - Proximal hydro appears  resolved per CT scan. She has report of nonobstructing stones. Reassess need for urology involvement   # UTI  - abx per primary team  - Urine culture NGT  # DM - noted. Per primary team. Normoglycemic.   # Hyperphosphatemia.initiating dialysis follow trends; should improve with improving renal function.  Subjective:   Feels cold with chills but denies fevers/ nausea/ CP / SOB No events overnight and tolerated HD x1    Objective:   BP (!) 125/54 (BP Location: Left Arm)   Pulse 93   Temp 98.1 F (36.7 C) (Axillary)   Resp 18   Ht 5\' 4"  (1.626 m)   Wt 56.3 kg   SpO2 96%   BMI 21.30 kg/m   Intake/Output Summary (Last 24 hours) at 01/01/2020 0815 Last data filed at 01/01/2020 01/03/2020 Gross per 24 hour  Intake 752.85 ml  Output 1750 ml  Net -997.15 ml   Weight change:   Physical Exam: General:Adult female in bed, oriented and pleasant HEENT:NCAT Heart:S1-S2 no rub Lungs:CTA B/L Abdomen:Soft nontender nondistended Extremities:Trace to 1+ edema lower extremities there is pain to touch.  Neuro:Patient  answers questions appropriately and  is  oriented. Foley catheter in place    Imaging: CT Head Wo Contrast  Result Date: 12/30/2019 CLINICAL DATA:  Altered mental status EXAM: CT HEAD WITHOUT CONTRAST TECHNIQUE: Contiguous axial images were obtained from the base of the skull through the vertex without intravenous contrast. COMPARISON:  None. FINDINGS: Brain: There is no acute intracranial hemorrhage, mass effect, or edema. Gray-white differentiation is preserved. There is no extra-axial fluid collection. Ventricles and sulci are within normal limits in size and configuration. Vascular: There is atherosclerotic calcification at  the skull base. Skull: Postoperative changes of prior bifrontal craniotomy. Sinuses/Orbits: No acute finding. Other: None. IMPRESSION: No acute intracranial abnormality. Electronically Signed   By: Guadlupe Spanish M.D.   On: 12/30/2019 13:09    CT Cervical Spine Wo Contrast  Result Date: 12/30/2019 CLINICAL DATA:  Progressive altered mental status, generalized weakness EXAM: CT CERVICAL SPINE WITHOUT CONTRAST TECHNIQUE: Multidetector CT imaging of the cervical spine was performed without intravenous contrast. Multiplanar CT image reconstructions were also generated. COMPARISON:  None. FINDINGS: Alignment: No significant listhesis. Skull base and vertebrae: There are postoperative changes of prior anterior fusion at C5-C7 with plate and screw fixation and well incorporated interbody grafts. No evidence of loosening. Vertebral body heights are maintained. Soft tissues and spinal canal: No prevertebral soft tissue swelling or visible epidural collection. Disc levels: There are bridging posterior endplate osteophytes at the operative levels and at C3-C4 and C4-C5. Multilevel uncovertebral and facet hypertrophy. There is fusion of the multiple left facets. There is no significant osseous encroachment on the spinal canal. Foraminal stenosis is greatest on the left at C3-C4 and C4-C5. Upper chest: Negative. Other: None. IMPRESSION: Postoperative changes at C5 and C7 without evidence of complication. No high-grade canal stenosis. Electronically Signed   By: Guadlupe Spanish M.D.   On: 12/30/2019 13:22   US RENAL  Result Date: 12/30/2019 CLINICAL DATA:  History of nephrolithiasis.  Renal failure. EXAM: RENAL / URINARY TRACT ULTRASOUND COMPLETE COMPARISON:  None. FINDINGS: Right Kidney: Renal measurements: 10.3 x 4.7 x 5.2 cm = volume: 132 mL . Echogenicity within normal limits. No mass or hydronephrosis visualized. Left Kidney: Renal measurements: 10.7 x 5.2 x 6.3 cm = volume: 184 mL. 7 mm calculus near the left lower pole. No hydronephrosis. Normal echogenicity. Bladder: Decompressed by Foley catheter. Other: None. IMPRESSION: No hydronephrosis.  Nonobstructive left lower pole renal calculus. Electronically Signed   By: Deatra Robinson M.D.   On: 12/30/2019  17:57   DG Chest Port 1 View  Result Date: 12/30/2019 CLINICAL DATA:  Central catheter placement EXAM: PORTABLE CHEST 1 VIEW COMPARISON:  Dec 30, 2019 study obtained earlier in the day FINDINGS: Central catheter tip is in the superior vena cava. No pneumothorax. The lungs are clear. Heart size and pulmonary vascularity are normal. No adenopathy. 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 There is postoperative change in the lower cervical spine. IMPRESSION: Central catheter tip in superior vena cava. No pneumothorax. Lungs clear. Cardiac silhouette normal. 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 Electronically Signed   By: Bretta Bang III M.D.   On: 12/30/2019 16:02   DG Chest Port 1 View  Result Date: 12/30/2019 CLINICAL DATA:  Renal failure EXAM: PORTABLE CHEST 1 VIEW COMPARISON:  11/18/2019 FINDINGS: Cardiac shadows within normal limits. Lungs are well aerated bilaterally. No focal infiltrate or sizable effusion is seen. Postsurgical changes in the cervical spine are noted. Multiple calcified loose bodies are noted within the left shoulder joint. IMPRESSION: No acute abnormality noted. Electronically Signed   By: Alcide Clever M.D.   On: 12/30/2019 12:12   CT Renal Stone Study  Result Date: 12/30/2019 CLINICAL DATA:  Altered mental status, history of renal stone EXAM: CT ABDOMEN AND PELVIS WITHOUT CONTRAST TECHNIQUE: Multidetector CT imaging of the abdomen and pelvis was performed following the standard protocol without IV contrast. COMPARISON:  12/07/2019 FINDINGS: Lower chest: No acute abnormality. Hepatobiliary: No focal liver abnormality is seen. Status post cholecystectomy. No biliary dilatation. Pancreas: Unremarkable. Spleen: Unremarkable. Adrenals/Urinary Tract: The adrenals are unremarkable.  Unchanged nonobstructing left renal calculi measuring up to 9 mm at the lower pole. Resolution of hydronephrosis. An 11 x 3 mm calculus is now present within the distal left ureter  approximately 6 cm above the ureterovesical junction. Bladder is unremarkable. Stomach/Bowel: Hiatal hernia is again noted. Bowel is normal in caliber. Vascular/Lymphatic: Aortic atherosclerosis. No enlarged abdominal or pelvic lymph nodes. Reproductive: Uterus and bilateral adnexa are unremarkable. Other: No ascites. Musculoskeletal: No acute osseous abnormality IMPRESSION: 11 x 3 mm calculus within the distal left ureter approximately 6 cm above the UVJ. Resolution of proximal hydroureteronephrosis. Unchanged nonobstructing left renal calculi. Hiatal hernia. Electronically Signed   By: Macy Mis M.D.   On: 12/30/2019 13:16    Labs: BMET Recent Labs  Lab 12/30/19 0948 12/30/19 1217 12/30/19 1555 12/31/19 0040 12/31/19 0431 12/31/19 1132 01/01/20 0444  NA 133* 132* 137  --  139 133* 140  K 3.8 4.1 3.2* 2.4* 2.9* 3.5 2.7*  CL 97*  --  104  --  103 101 107  CO2 10*  --  10*  --  20* 20* 21*  GLUCOSE 163*  --  59*  --  95 223* 255*  BUN 165*  --  160*  --  58* 52* 32*  CREATININE 7.15*  --  6.37*  --  2.41* 2.02* 1.28*  CALCIUM 7.8*  --  6.8*  --  7.3* 6.6* 7.0*  PHOS 8.4*  --  7.0*  --  1.7*  --  1.9*   CBC Recent Labs  Lab 12/30/19 0948 12/30/19 1217 12/30/19 1531 12/31/19 0431  WBC 11.1*  --  9.1 8.7  HGB 13.3 12.9 10.8* 11.0*  HCT 38.5 38.0 31.4* 30.3*  MCV 86.5  --  86.7 83.5  PLT 146*  --  97* 87*    Medications:    . Chlorhexidine Gluconate Cloth  6 each Topical Q0600  . Chlorhexidine Gluconate Cloth  6 each Topical Q0600  . heparin  5,000 Units Subcutaneous Q8H      Otelia Santee, MD 01/01/2020, 8:15 AM

## 2020-01-01 NOTE — Progress Notes (Signed)
Inpatient Rehab Admissions Coordinator Note:   Per PT recommendation, pt was screened for CIR candidacy by Wolfgang Phoenix, MS, CCC-SLP.  At this time we are recommending Inpatient Rehab consult.  AC will place order per protocol.  Please contact me with questions.    Wolfgang Phoenix, MS, CCC-SLP Admissions Coordinator [phone]

## 2020-01-01 NOTE — Progress Notes (Signed)
eLink Physician-Brief Progress Note Patient Name: Monica [name] DOB: June 11, 1951 MRN: [id number]   Date of Service  01/01/2020  HPI/Events of Note  1. Hypokalemia - K+ = 2.7 and 2. Hypomagnesemia - Mg++ = 1.4.   eICU Interventions  Plan: 1. Will replace Mg++. 2. Defer K+ replacement to Nephrology.      Intervention Category Major Interventions: Electrolyte abnormality - evaluation and management  Sommer,Steven Eugene 01/01/2020, 5:45 AM

## 2020-01-01 NOTE — Progress Notes (Signed)
Inpatient Rehab Admissions:  Inpatient Rehab Consult received.  I met with pt and husband, Lennette Bihari, at the bedside for rehabilitation assessment and to discuss goals and expectations of an inpatient rehab admission.  Pt lethargic but able to indicate that she and husband are interested in [facility] program.   Husband acknowledged understanding of CIR goals and expectations. Pt appears to be an appropriate candidate for CIR. Will continue to monitor pt's medical workup and progress with acute care therapies.  Signed: Gayland Curry, [place], [place] Admissions Coordinator [phone]

## 2020-01-01 NOTE — Progress Notes (Signed)
PROGRESS NOTE  Monica [name]  HTD:[id number] DOB: 1951/08/05 DOA: 12/30/2019 PCP: [name], FNP   Brief Narrative: Monica [name] is a 69 y.o. female with a history of chronic pain on NSAIDs and methadone followed by [name], occipital neuralgia, progressive weakness since Jan 2021 undergoing outpatient work up, nephrolithiasis s/p ESWL 5/3 who presented to the ED with worsening weakness, lethargy found to be lethargic with significant acidosis and renal failure (creatinine 7.15 from baseline of 0.9). HD catheter was placed and HD performed in the ICU. With improving metabolic parameters and urine output, mentation improved and she was transferred to hospitalist service 5/22. Renal function continues to improve, inpatient rehabilitation is recommended and has been consulted.  Assessment & Plan: Active Problems:   Encounter for central line placement   Renal failure  Acute renal failure with AGMA, lactic acidosis: Multifactorial, though rapid improvement argues for prerenal azotemia more than ATN. s/p HD tx x1 in ICU.  - Monitor BMP in AM. Bicarb gtt stopped, taking po. - Decrease doses of medications given decreased clearance, hold NSAIDs. Continue holding metformin. - If renal function continues improvement, UOP remains adequate, could remove HD cath per nephrology tomorrow AM  Nephrolithiasis: recent lithotripsy by Dr. [name]. No obstruction on CT renal stone study this admission. CTstone study with 11 x 3 mm calculus within the distal left ureter approximately 6 cm above the UVJ. Resolution of proximal hydroureteronephrosis.Unchanged nonobstructing left renal calculi. - No intervention currently planned.   Acute metabolic encephalopathy: Improved with Tx renal failure and holding medications.  - Restart home medications to avoid withdrawal, will lower doses. Consider tapering off depending on clinical response.   T2DM: Poorly controlled with HbA1c 9.4%.  - Start SSI   Occipital neuralgia, chronic pain:  - Restart lower doses of medications as above. Holding NSAIDs.  Thrombocytopenia: Not consistent with HIT - Recheck in AM.  Chronic, progressive LE weakness and paresthesia, debility:  - PT/OT, CIR consulted. Patient of [name]. - continue outpatient work up including nerve conduction studies.  Hyponatremia: Resolved  Hypokalemia: Supplement today per nephrology  Hypomagnesemia:  - Supplement today, monitor  Pyuria: - Urine culture negative, received 1 dose CTX. Blood cultures NGTD  HLD:  - Restart lipitor  DVT prophylaxis: Heparin Code Status: Full Family Communication: Husband at bedside Disposition Plan:  Status is: Inpatient  Remains inpatient appropriate because:Persistent severe electrolyte disturbances   Dispo: The patient is from: Home              Anticipated d/c is to: CIR              Anticipated d/c date is: 1 day              Patient currently is not medically stable to d/c.  Consultants:  PCCM primary through 5/21 Nephrology CIR  Procedures:  Right IJ CVL HD cath 5/20  Antimicrobials:  Ceftriaxone x1   Subjective: Mental status is near baseline again per husband, she's more alert, denies any specific pains at this time though legs are tender with any palpation. Urine output picking up, she's eating [employer] for breakfast.  Objective: Vitals:   12/31/19 2225 01/01/20 0000 01/01/20 0400 01/01/20 0745  BP:  134/61 121/67 (!) 125/54  Pulse: (!) 112 (!) 109 (!) 101 93  Resp: (!) 23 18 16 18   Temp: 98.6 F (37 C) 98.3 F (36.8 C) 99.2 F (37.3 C) 98.1 F (36.7 C)  TempSrc:  Oral Oral Axillary  SpO2: 99%  99% 97% 96%  Weight:      Height:        Intake/Output Summary (Last 24 hours) at 01/01/2020 1614 Last data filed at 01/01/2020 0604 Gross per 24 hour  Intake -  Output 1050 ml  Net -1050 ml   Filed Weights   12/30/19 0933 12/31/19 0500  Weight: 62.6 kg 56.3 kg   Gen: 69 y.o. female in no  distress but chronically ill-appearing Pulm: Non-labored breathing room air. Clear to auscultation bilaterally.  CV: Regular rate and rhythm. No murmur, rub, or gallop. No JVD, no pitting pedal edema, husband states this is improved. GI: Abdomen soft, non-tender, non-distended, with normoactive bowel sounds. No organomegaly or masses felt. Ext: Warm, no deformities Skin: HD cath site was bleeding, currently hemostatic s/p RN redressing. No erythema or exudate. No rashes, lesions or ulcers on visualized skin Neuro: Alert and oriented. No focal neurological deficits. Psych: Judgement and insight appear normal. Mood & affect appropriate.   Data Reviewed: I have personally reviewed following labs and imaging studies  CBC: Recent Labs  Lab 12/30/19 0948 12/30/19 1217 12/30/19 1531 12/31/19 0431  WBC 11.1*  --  9.1 8.7  HGB 13.3 12.9 10.8* 11.0*  HCT 38.5 38.0 31.4* 30.3*  MCV 86.5  --  86.7 83.5  PLT 146*  --  97* 87*   Basic Metabolic Panel: Recent Labs  Lab 12/30/19 0948 12/30/19 0948 12/30/19 1217 12/30/19 1217 12/30/19 1555 12/31/19 0040 12/31/19 0431 12/31/19 1132 01/01/20 0444  NA 133*   < > 132*  --  137  --  139 133* 140  K 3.8   < > 4.1   < > 3.2* 2.4* 2.9* 3.5 2.7*  CL 97*  --   --   --  104  --  103 101 107  CO2 10*  --   --   --  10*  --  20* 20* 21*  GLUCOSE 163*  --   --   --  59*  --  95 223* 255*  BUN 165*  --   --   --  160*  --  58* 52* 32*  CREATININE 7.15*  --   --   --  6.37*  --  2.41* 2.02* 1.28*  CALCIUM 7.8*  --   --   --  6.8*  --  7.3* 6.6* 7.0*  MG 1.5*  --   --   --   --  1.1* 2.4  --  1.4*  PHOS 8.4*  --   --   --  7.0*  --  1.7*  --  1.9*   < > = values in this interval not displayed.   GFR: Estimated Creatinine Clearance: 36.8 mL/min (A) (by C-G formula based on SCr of 1.28 mg/dL (H)). Liver Function Tests: Recent Labs  Lab 12/30/19 1111 12/30/19 1555 12/31/19 0431  AST 6*  --   --   ALT 13  --   --   ALKPHOS 94  --   --   BILITOT  1.7*  --   --   PROT 7.1  --   --   ALBUMIN 3.8 3.0* 3.1*   No results for input(s): LIPASE, AMYLASE in the last 168 hours. Recent Labs  Lab 12/30/19 1046  AMMONIA 29   Coagulation Profile: Recent Labs  Lab 12/30/19 1207  INR 1.4*   Cardiac Enzymes: Recent Labs  Lab 12/30/19 1333  CKTOTAL 53   BNP (last 3 results) No results for input(s): PROBNP  in the last 8760 hours. HbA1C: Recent Labs    12/31/19 0433  HGBA1C 9.4*   CBG: Recent Labs  Lab 12/30/19 1947 12/30/19 2317 12/31/19 0331 12/31/19 0742 12/31/19 1110  GLUCAP 150* 93 74 110* 210*   Lipid Profile: No results for input(s): CHOL, HDL, LDLCALC, TRIG, CHOLHDL, LDLDIRECT in the last 72 hours. Thyroid Function Tests: No results for input(s): TSH, T4TOTAL, FREET4, T3FREE, THYROIDAB in the last 72 hours. Anemia Panel: Recent Labs    12/30/19 1544  VITAMINB12 821   Urine analysis:    Component Value Date/Time   COLORURINE YELLOW 12/30/2019 1335   APPEARANCEUR HAZY (A) 12/30/2019 1335   LABSPEC 1.015 12/30/2019 1335   PHURINE 5.0 12/30/2019 1335   GLUCOSEU 50 (A) 12/30/2019 1335   HGBUR LARGE (A) 12/30/2019 1335   BILIRUBINUR NEGATIVE 12/30/2019 1335   KETONESUR NEGATIVE 12/30/2019 1335   PROTEINUR 100 (A) 12/30/2019 1335   UROBILINOGEN 0.2 05/04/2012 1109   NITRITE NEGATIVE 12/30/2019 1335   LEUKOCYTESUR SMALL (A) 12/30/2019 1335   Recent Results (from the past 240 hour(s))  Culture, blood (routine x 2)     Status: None (Preliminary result)   Collection Time: 12/30/19 11:00 AM   Specimen: BLOOD RIGHT ARM  Result Value Ref Range Status   Specimen Description BLOOD RIGHT ARM  Final   Special Requests   Final    BOTTLES DRAWN AEROBIC AND ANAEROBIC Blood Culture results may not be optimal due to an inadequate volume of blood received in culture bottles   Culture   Final    NO GROWTH 2 DAYS Performed at [facility], [address], [place], [place] [address]    Report Status PENDING   Incomplete  Urine culture     Status: None   Collection Time: 12/30/19  1:35 PM   Specimen: Urine, Random  Result Value Ref Range Status   Specimen Description URINE, RANDOM  Final   Special Requests NONE  Final   Culture   Final    NO GROWTH Performed at [facility], [address], [place], [place] [address]    Report Status 12/31/2019 FINAL  Final  SARS Coronavirus 2 by RT PCR (hospital order, performed in [facility] hospital lab) Nasopharyngeal Nasopharyngeal Swab     Status: None   Collection Time: 12/30/19  1:46 PM   Specimen: Nasopharyngeal Swab  Result Value Ref Range Status   SARS Coronavirus 2 NEGATIVE NEGATIVE Final    Comment: (NOTE) SARS-CoV-2 target nucleic acids are NOT DETECTED. The SARS-CoV-2 RNA is generally detectable in upper and lower respiratory specimens during the acute phase of infection. The lowest concentration of SARS-CoV-2 viral copies this assay can detect is 250 copies / mL. A negative result does not preclude SARS-CoV-2 infection and should not be used as the sole basis for treatment or other patient management decisions.  A negative result may occur with improper specimen collection / handling, submission of specimen other than nasopharyngeal swab, presence of viral mutation(s) within the areas targeted by this assay, and inadequate number of viral copies (<250 copies / mL). A negative result must be combined with clinical observations, patient history, and epidemiological information. Fact Sheet for Patients:   BoilerBrush.com.cy Fact Sheet for Healthcare Providers: https://pope.com/ This test is not yet approved or cleared  by the [place] FDA and has been authorized for detection and/or diagnosis of SARS-CoV-2 by FDA under an Emergency Use Authorization (EUA).  This EUA will remain in effect (meaning this test  can be used) for the duration of the COVID-19 declaration under Section  564(b)(1) of the Act, 21 U.S.C. section 360bbb-3(b)(1), unless the authorization is terminated or revoked sooner. Performed at [facility], [place] [address], [place], [place] [address]   Culture, blood (routine x 2)     Status: None (Preliminary result)   Collection Time: 12/30/19  8:40 PM   Specimen: BLOOD  Result Value Ref Range Status   Specimen Description BLOOD LEFT HAND  Final   Special Requests   Final    BOTTLES DRAWN AEROBIC ONLY Blood Culture adequate volume   Culture   Final    NO GROWTH 2 DAYS Performed at [facility], [place] [address], [place], [place] [address]    Report Status PENDING  Incomplete      Radiology Studies: US RENAL  Result Date: 12/30/2019 CLINICAL DATA:  History of nephrolithiasis.  Renal failure. EXAM: RENAL / URINARY TRACT ULTRASOUND COMPLETE COMPARISON:  None. FINDINGS: Right Kidney: Renal measurements: 10.3 x 4.7 x 5.2 cm = volume: 132 mL . Echogenicity within normal limits. No mass or hydronephrosis visualized. Left Kidney: Renal measurements: 10.7 x 5.2 x 6.3 cm = volume: 184 mL. 7 mm calculus near the left lower pole. No hydronephrosis. Normal echogenicity. Bladder: Decompressed by Foley catheter. Other: None. IMPRESSION: No hydronephrosis.  Nonobstructive left lower pole renal calculus. Electronically Signed   By: Ulyses Jarred M.D.   On: 12/30/2019 17:57    Scheduled Meds: . atorvastatin  10 mg Oral Daily  . Chlorhexidine Gluconate Cloth  6 each Topical Q0600  . Chlorhexidine Gluconate Cloth  6 each Topical Q0600  . heparin  5,000 Units Subcutaneous Q8H  . methadone  10 mg Oral TID  . nortriptyline  10 mg Oral QHS  . potassium chloride  40 mEq Oral Q4H  . topiramate  50 mg Oral BID   Continuous Infusions: . sodium chloride    . sodium chloride    . dextrose Stopped (12/31/19 1406)  .  sodium bicarbonate (isotonic) infusion in sterile water Stopped (12/31/19 0139)     LOS: 2 days   Time spent: 25 minutes.  Patrecia Pour, MD  [facility] [email or web address] 01/01/2020, 4:14 PM

## 2020-01-01 NOTE — Progress Notes (Signed)
CRITICAL VALUE STICKER  CRITICAL VALUE: K+ 2.7  RECEIVER (on-site recipient of call): Roman Sandall R RN   DATE & TIME NOTIFIED: 01/01/20 0535  MD NOTIFIED: Arsenio Loader MD  TIME OF NOTIFICATION:0540  RESPONSE: Paged Nephrology, replace magnesium 2g

## 2020-01-01 NOTE — Progress Notes (Signed)
eLink Physician-Brief Progress Note Patient Name: Monica [name] DOB: 1951-02-27 MRN: [id number]   Date of Service  01/01/2020  HPI/Events of Note  Called d/t elevated HR - Bedside monitor reveals sinus tachycardia with PAC's.  eICU Interventions  Plan: 1. Send aM labs now. 2. Mg++ level STAT.      Intervention Category Major Interventions: Arrhythmia - evaluation and management  Jorden Mahl Eugene 01/01/2020, 4:27 AM

## 2020-01-01 NOTE — Progress Notes (Signed)
Pt CBG 441, lab collected at 1918 showed glucose 440. Pt asymptomatic, alert and oriented x 4, VS stable.  Opyd, MD notified.

## 2020-01-01 NOTE — Evaluation (Signed)
Physical Therapy Evaluation Patient Details Name: Monica [name] MRN: [id number] DOB: 05-Sep-1950 Today's Date: 01/01/2020   History of Present Illness  Pt is a 69 year old diabetic female who presented 5/20 with new renal failure, metabolic acidosis and acute metabolic encephalopathy.    Clinical Impression  Pt admitted with above diagnosis. PTA pt lived at home with her husband. She has had a gradual decline in strength and mobility since January, requiring increased assist from her husband with ADLs and experiencing multi falls. On eval, she required mod assist bed mobility, mod assist sit to stand and min assist ambulation 5' with RW. She presents with deficits in strength, balance, and activity tolerance. Of primary note, she c/o RLE and exhibits 2/5 strength in that leg. She is very motivated to regain her strength and independence. She has good family support through her husband. Pt currently with functional limitations due to the deficits listed below (see PT Problem List). Pt will benefit from skilled PT to increase their independence and safety with mobility to allow discharge to the venue listed below.       Follow Up Recommendations CIR;Supervision/Assistance - 24 hour    Equipment Recommendations  Other (comment)(TBD)    Recommendations for Other Services Rehab consult     Precautions / Restrictions Precautions Precautions: Fall      Mobility  Bed Mobility Overal bed mobility: Needs Assistance Bed Mobility: Supine to Sit     Supine to sit: Mod assist;HOB elevated     General bed mobility comments: +rail, cues for sequencing, use of bed pad to scoot to EOB  Transfers Overall transfer level: Needs assistance Equipment used: Rolling walker (2 wheeled) Transfers: Sit to/from Stand Sit to Stand: Mod assist         General transfer comment: cues for hand placement, assist to power up  Ambulation/Gait Ambulation/Gait assistance: Min assist Gait Distance (Feet):  5 Feet Assistive device: Rolling walker (2 wheeled) Gait Pattern/deviations: Step-through pattern;Decreased stride length;Decreased stance time - right Gait velocity: decreased Gait velocity interpretation: <1.8 ft/sec, indicate of risk for recurrent falls General Gait Details: increased time to place R foot following swing phase. Assist to maintain balance.  Stairs            Wheelchair Mobility    Modified Rankin (Stroke Patients Only)       Balance Overall balance assessment: Needs assistance Sitting-balance support: Feet supported;No upper extremity supported Sitting balance-Leahy Scale: Fair     Standing balance support: Bilateral upper extremity supported;During functional activity Standing balance-Leahy Scale: Poor Standing balance comment: reliant on external support                             Pertinent Vitals/Pain Pain Assessment: Faces Faces Pain Scale: Hurts little more Pain Location: RLE to palpation/touch Pain Descriptors / Indicators: Tender Pain Intervention(s): Monitored during session;Repositioned    Home Living Family/patient expects to be discharged to:: Private residence Living Arrangements: Spouse/significant other Available Help at Discharge: Family;Available 24 hours/day Type of Home: House Home Access: Stairs to enter Entrance Stairs-Rails: Right Entrance Stairs-Number of Steps: 4 Home Layout: Multi-level;Able to live on main level with bedroom/bathroom Home Equipment: Gilford Rile - 2 wheels;Cane - single point      Prior Function Level of Independence: Needs assistance   Gait / Transfers Assistance Needed: ambulates with cane vs RW  ADL's / Homemaking Assistance Needed: husband assists with bathing and dressing. Husband completes all iADLs.  Hand Dominance        Extremity/Trunk Assessment   Upper Extremity Assessment Upper Extremity Assessment: Defer to OT evaluation    Lower Extremity Assessment Lower  Extremity Assessment: Generalized weakness;RLE deficits/detail;LLE deficits/detail RLE Deficits / Details: 2/5 grossly graded LLE Deficits / Details: 4/5 grossly graded    Cervical / Trunk Assessment Cervical / Trunk Assessment: Kyphotic  Communication   Communication: No difficulties  Cognition Arousal/Alertness: Awake/alert Behavior During Therapy: WFL for tasks assessed/performed Overall Cognitive Status: Impaired/Different from baseline Area of Impairment: Memory;Problem solving;Safety/judgement                     Memory: Decreased short-term memory   Safety/Judgement: Decreased awareness of safety;Decreased awareness of deficits   Problem Solving: Slow processing;Difficulty sequencing;Requires verbal cues        General Comments General comments (skin integrity, edema, etc.): Pt reports feeling shaking/tremulous. No tremors noted. max HR 118.    Exercises     Assessment/Plan    PT Assessment Patient needs continued PT services  PT Problem List Decreased strength;Decreased mobility;Decreased safety awareness;Decreased knowledge of precautions;Decreased activity tolerance;Decreased cognition;Pain;Decreased balance;Decreased knowledge of use of DME       PT Treatment Interventions DME instruction;Therapeutic activities;Cognitive remediation;Gait training;Therapeutic exercise;Patient/family education;Balance training;Stair training;Functional mobility training    PT Goals (Current goals can be found in the Care Plan section)  Acute Rehab PT Goals Patient Stated Goal: get stronger PT Goal Formulation: With patient/family Time For Goal Achievement: 01/15/20 Potential to Achieve Goals: Good    Frequency Min 3X/week   Barriers to discharge        Co-evaluation               AM-PAC PT "6 Clicks" Mobility  Outcome Measure Help needed turning from your back to your side while in a flat bed without using bedrails?: A Little Help needed moving from lying  on your back to sitting on the side of a flat bed without using bedrails?: A Lot Help needed moving to and from a bed to a chair (including a wheelchair)?: A Lot Help needed standing up from a chair using your arms (e.g., wheelchair or bedside chair)?: A Little Help needed to walk in hospital room?: A Lot Help needed climbing 3-5 steps with a railing? : A Lot 6 Click Score: 14    End of Session Equipment Utilized During Treatment: Gait belt Activity Tolerance: Patient tolerated treatment well Patient left: in chair;with call bell/phone within reach;with family/visitor present Nurse Communication: Mobility status PT Visit Diagnosis: Other abnormalities of gait and mobility (R26.89);Muscle weakness (generalized) (M62.81);History of falling (Z91.81)    Time: 7939-0300 PT Time Calculation (min) (ACUTE ONLY): 31 min   Charges:   PT Evaluation $PT Eval Moderate Complexity: 1 Mod PT Treatments $Gait Training: 8-22 mins        Aida Raider, PT  Office # [phone] Pager [phone]   Ilda Foil 01/01/2020, 10:26 AM

## 2020-01-01 NOTE — Progress Notes (Signed)
   01/01/20 2021  MEWS Score  MEWS Score Color Yellow  Not an acute change, pt in no distress

## 2020-01-02 ENCOUNTER — Encounter (HOSPITAL_COMMUNITY): Payer: Self-pay | Admitting: Pulmonary Disease

## 2020-01-02 LAB — CBC
HCT: 28.6 % — ABNORMAL LOW (ref 36.0–46.0)
Hemoglobin: 9.5 g/dL — ABNORMAL LOW (ref 12.0–15.0)
MCH: 29.4 pg (ref 26.0–34.0)
MCHC: 33.2 g/dL (ref 30.0–36.0)
MCV: 88.5 fL (ref 80.0–100.0)
Platelets: 84 10*3/uL — ABNORMAL LOW (ref 150–400)
RBC: 3.23 MIL/uL — ABNORMAL LOW (ref 3.87–5.11)
RDW: 13.3 % (ref 11.5–15.5)
WBC: 5.5 10*3/uL (ref 4.0–10.5)
nRBC: 0 % (ref 0.0–0.2)

## 2020-01-02 LAB — BASIC METABOLIC PANEL
Anion gap: 6 (ref 5–15)
BUN: 25 mg/dL — ABNORMAL HIGH (ref 8–23)
CO2: 22 mmol/L (ref 22–32)
Calcium: 7.8 mg/dL — ABNORMAL LOW (ref 8.9–10.3)
Chloride: 109 mmol/L (ref 98–111)
Creatinine, Ser: 1.02 mg/dL — ABNORMAL HIGH (ref 0.44–1.00)
GFR calc Af Amer: >60 mL/min (ref >60)
GFR calc non Af Amer: 57 mL/min — ABNORMAL LOW (ref >60)
Glucose, Bld: 294 mg/dL — ABNORMAL HIGH (ref 70–99)
Potassium: 4.6 mmol/L (ref 3.5–5.1)
Sodium: 137 mmol/L (ref 135–145)

## 2020-01-02 LAB — GLUCOSE, CAPILLARY
Glucose-Capillary: 300 mg/dL — ABNORMAL HIGH (ref 70–99)
Glucose-Capillary: 332 mg/dL — ABNORMAL HIGH (ref 70–99)
Glucose-Capillary: 241 mg/dL — ABNORMAL HIGH (ref 70–99)
Glucose-Capillary: 320 mg/dL — ABNORMAL HIGH (ref 70–99)

## 2020-01-02 LAB — MPO/PR-3 (ANCA) ANTIBODIES
ANCA Proteinase 3: <3.5 U/mL (ref 0.0–3.5)
Myeloperoxidase Abs: <9 U/mL (ref 0.0–9.0)

## 2020-01-02 LAB — MAGNESIUM: Magnesium: 1.4 mg/dL — ABNORMAL LOW (ref 1.7–2.4)

## 2020-01-02 MED ORDER — METHADONE HCL 10 MG PO TABS
20.0000 mg | ORAL_TABLET | Freq: Three times a day (TID) | ORAL | Status: DC
  Administered 2020-01-02 – 2020-01-03 (×4): 20 mg via ORAL
  Filled 2020-01-02 (×4): qty 2

## 2020-01-02 MED ORDER — MAGNESIUM SULFATE 2 GM/50ML IV SOLN
2.0000 g | Freq: Once | INTRAVENOUS | Status: AC
  Administered 2020-01-02: 2 g via INTRAVENOUS
  Filled 2020-01-02: qty 50

## 2020-01-02 MED ORDER — INSULIN GLARGINE 100 UNIT/ML ~~LOC~~ SOLN
12.0000 [IU] | Freq: Every day | SUBCUTANEOUS | Status: DC
  Administered 2020-01-02 – 2020-01-03 (×2): 12 [IU] via SUBCUTANEOUS
  Filled 2020-01-02 (×2): qty 0.12

## 2020-01-02 MED ORDER — INSULIN GLARGINE 100 UNIT/ML ~~LOC~~ SOLN
15.0000 [IU] | Freq: Every day | SUBCUTANEOUS | Status: DC
  Filled 2020-01-02: qty 0.15

## 2020-01-02 NOTE — Progress Notes (Addendum)
Progress Note    Monica [name]  JGO:[id number] DOB: [birth year]  DOA: 12/30/2019 PCP: [name], FNP      Brief Narrative:    Medical records reviewed and are as summarized below:  Monica [name] is an 69 y.o. female with a history of chronic pain on NSAIDs and methadone followed by [name], occipital neuralgia, progressive weakness since Jan 2021 undergoing outpatient work up, nephrolithiasis s/p ESWL 5/3 who presented to the ED with worsening weakness, lethargy found to be lethargic with significant acidosis and renal failure (creatinine 7.15 from baseline of 0.9). HD catheter was placed and HD performed in the ICU. With improving metabolic parameters and urine output, mentation improved and she was transferred to hospitalist service 5/22. Renal function continues to improve, inpatient rehabilitation is recommended and has been consulted      Assessment/Plan:   Active Problems:   Encounter for central line placement   Renal failure   Acute renal failure with AGMA, lactic acidosis: Improved.  She is off of IV fluids/bicarb infusion.  Hemodialysis catheter has been removed. s/p HD tx x1 in ICU.  NSAIDs and Metformin are still on hold.  Nephrolithiasis: recent lithotripsy by Dr. [name]. No obstruction on CT renal stone study this admission. CTstone study with 11 x 3 mm calculus within the distal left ureter approximately 6 cm above the UVJ. Resolution of proximal hydroureteronephrosis.Unchanged nonobstructing left renal calculi. - No intervention currently planned.   Acute metabolic encephalopathy: Improved with Tx renal failure and holding medications.    T2DM: Poorly controlled with HbA1c 9.4%.  - Start Lantus.  Continue NovoLog as needed for hyperglycemia.  Occipital neuralgia, chronic pain:  Increase methadone to 20 mg 3 times daily which is her regular home dose.  Thrombocytopenia: Not consistent with HIT -Platelet count is still low but  stable.  Recheck in AM.  Chronic, progressive LE weakness and paresthesia, debility:  - PT/OT, CIR consulted. Patient of [name]. - continue outpatient work up including nerve conduction studies.  Hyponatremia: Resolved  Hypokalemia: Improved.  Hypomagnesemia:  - Supplement today, monitor  Pyuria: - Urine culture negative, received 1 dose CTX. Blood cultures NGTD  HLD:  - Restart lipitor  Body mass index is 21.53 kg/m.   Family Communication/Anticipated D/C date and plan/Code Status   DVT prophylaxis: Heparin Code Status: Full code Family Communication: Plan discussed with her husband at the bedside Disposition Plan:    Status is: Inpatient  Remains inpatient appropriate because:Inpatient level of care appropriate due to severity of illness   Dispo: The patient is from: Home              Anticipated d/c is to: Home              Anticipated d/c date is: 1 day              Patient currently is not medically stable to d/c.  She has had a change of mind and does not want to go to inpatient rehabilitation center.  She prefers to go home.  Possible discharge home tomorrow if she continues to improve.          Subjective:   She complains of chronic headache and request that her methadone be increased to 20 mg 3 times daily which is her regular home dose.  Objective:    Vitals:   01/02/20 0500 01/02/20 0619 01/02/20 1213 01/02/20 1230  BP:   128/77   Pulse:   Marland Kitchen)  105   Resp:   (!) 21 14  Temp:  98.5 F (36.9 C) 98 F (36.7 C)   TempSrc:  Oral Axillary   SpO2:   98% 99%  Weight: 56.9 kg     Height:       No data found.   Intake/Output Summary (Last 24 hours) at 01/02/2020 1247 Last data filed at 01/02/2020 0406 Gross per 24 hour  Intake 1062 ml  Output 375 ml  Net 687 ml   Filed Weights   12/30/19 0933 12/31/19 0500 01/02/20 0500  Weight: 62.6 kg 56.3 kg 56.9 kg    Exam:  GEN: NAD SKIN: No rash EYES: EOMI ENT: MMM CV:  RRR PULM: CTA B ABD: soft, ND, NT, +BS CNS: AAO x 3, non focal EXT: No edema or tenderness   Data Reviewed:   I have personally reviewed following labs and imaging studies:  Labs: Labs show the following:   Basic Metabolic Panel: Recent Labs  Lab 12/30/19 0948 12/30/19 1217 12/30/19 1555 12/30/19 1555 12/31/19 0040 12/31/19 0431 12/31/19 0431 12/31/19 1132 12/31/19 1132 01/01/20 0444 01/01/20 0444 01/01/20 1918 01/02/20 0641  NA 133*   < > 137   < >  --  139  --  133*  --  140  --  136 137  K 3.8   < > 3.2*   < > 2.4* 2.9*   < > 3.5   < > 2.7*   < > 4.0 4.6  CL 97*   < > 104   < >  --  103  --  101  --  107  --  103 109  CO2 10*   < > 10*   < >  --  20*  --  20*  --  21*  --  24 22  GLUCOSE 163*   < > 59*   < >  --  95  --  223*  --  255*  --  440* 294*  BUN 165*   < > 160*   < >  --  58*  --  52*  --  32*  --  25* 25*  CREATININE 7.15*   < > 6.37*   < >  --  2.41*  --  2.02*  --  1.28*  --  1.13* 1.02*  CALCIUM 7.8*   < > 6.8*   < >  --  7.3*  --  6.6*  --  7.0*  --  7.3* 7.8*  MG 1.5*  --   --   --  1.1* 2.4  --   --   --  1.4*  --   --  1.4*  PHOS 8.4*  --  7.0*  --   --  1.7*  --   --   --  1.9*  --   --   --    < > = values in this interval not displayed.   GFR Estimated Creatinine Clearance: 46.2 mL/min (A) (by C-G formula based on SCr of 1.02 mg/dL (H)). Liver Function Tests: Recent Labs  Lab 12/30/19 1111 12/30/19 1555 12/31/19 0431  AST 6*  --   --   ALT 13  --   --   ALKPHOS 94  --   --   BILITOT 1.7*  --   --   PROT 7.1  --   --   ALBUMIN 3.8 3.0* 3.1*   No results for input(s): LIPASE, AMYLASE in the last 168 hours. Recent Labs  Lab 12/30/19 1046  AMMONIA 29   Coagulation profile Recent Labs  Lab 12/30/19 1207  INR 1.4*    CBC: Recent Labs  Lab 12/30/19 0948 12/30/19 1217 12/30/19 1531 12/31/19 0431 01/02/20 0641  WBC 11.1*  --  9.1 8.7 5.5  HGB 13.3 12.9 10.8* 11.0* 9.5*  HCT 38.5 38.0 31.4* 30.3* 28.6*  MCV 86.5  --  86.7  83.5 88.5  PLT 146*  --  97* 87* 84*   Cardiac Enzymes: Recent Labs  Lab 12/30/19 1333  CKTOTAL 53   BNP (last 3 results) No results for input(s): PROBNP in the last 8760 hours. CBG: Recent Labs  Lab 12/31/19 0742 12/31/19 1110 01/01/20 2028 01/02/20 0934 01/02/20 1213  GLUCAP 110* 210* 441* 300* 332*   D-Dimer: No results for input(s): DDIMER in the last 72 hours. Hgb A1c: Recent Labs    12/31/19 0433  HGBA1C 9.4*   Lipid Profile: No results for input(s): CHOL, HDL, LDLCALC, TRIG, CHOLHDL, LDLDIRECT in the last 72 hours. Thyroid function studies: No results for input(s): TSH, T4TOTAL, T3FREE, THYROIDAB in the last 72 hours.  Invalid input(s): FREET3 Anemia work up: Recent Labs    12/30/19 1544  VITAMINB12 821   Sepsis Labs: Recent Labs  Lab 12/30/19 0948 12/30/19 1130 12/30/19 1330 12/30/19 1531 12/31/19 0431 01/02/20 0641  WBC 11.1*  --   --  9.1 8.7 5.5  LATICACIDVEN  --  2.7* 2.4*  --   --   --     Microbiology Recent Results (from the past 240 hour(s))  Culture, blood (routine x 2)     Status: None (Preliminary result)   Collection Time: 12/30/19 11:00 AM   Specimen: BLOOD RIGHT ARM  Result Value Ref Range Status   Specimen Description BLOOD RIGHT ARM  Final   Special Requests   Final    BOTTLES DRAWN AEROBIC AND ANAEROBIC Blood Culture results may not be optimal due to an inadequate volume of blood received in culture bottles   Culture   Final    NO GROWTH 2 DAYS Performed at [facility], [address] [place], [place] [address]    Report Status PENDING  Incomplete  Urine culture     Status: None   Collection Time: 12/30/19  1:35 PM   Specimen: Urine, Random  Result Value Ref Range Status   Specimen Description URINE, RANDOM  Final   Special Requests NONE  Final   Culture   Final    NO GROWTH Performed at [facility], [address], [place], [place] [address]    Report Status 12/31/2019 FINAL  Final  SARS  Coronavirus 2 by RT PCR (hospital order, performed in [facility] hospital lab) Nasopharyngeal Nasopharyngeal Swab     Status: None   Collection Time: 12/30/19  1:46 PM   Specimen: Nasopharyngeal Swab  Result Value Ref Range Status   SARS Coronavirus 2 NEGATIVE NEGATIVE Final    Comment: (NOTE) SARS-CoV-2 target nucleic acids are NOT DETECTED. The SARS-CoV-2 RNA is generally detectable in upper and lower respiratory specimens during the acute phase of infection. The lowest concentration of SARS-CoV-2 viral copies this assay can detect is 250 copies / mL. A negative result does not preclude SARS-CoV-2 infection and should not be used as the sole basis for treatment or other patient management decisions.  A negative result may occur with improper specimen collection / handling, submission of specimen other than nasopharyngeal swab, presence of viral mutation(s) within the areas targeted  by this assay, and inadequate number of viral copies (<250 copies / mL). A negative result must be combined with clinical observations, patient history, and epidemiological information. Fact Sheet for Patients:   BoilerBrush.com.cy Fact Sheet for Healthcare Providers: https://pope.com/ This test is not yet approved or cleared  by the [place] FDA and has been authorized for detection and/or diagnosis of SARS-CoV-2 by FDA under an Emergency Use Authorization (EUA).  This EUA will remain in effect (meaning this test can be used) for the duration of the COVID-19 declaration under Section 564(b)(1) of the Act, 21 U.S.C. section 360bbb-3(b)(1), unless the authorization is terminated or revoked sooner. Performed at [facility], [address], [place], [place] [address]   Culture, blood (routine x 2)     Status: None (Preliminary result)   Collection Time: 12/30/19  8:40 PM   Specimen: BLOOD  Result Value Ref Range Status   Specimen Description  BLOOD LEFT HAND  Final   Special Requests   Final    BOTTLES DRAWN AEROBIC ONLY Blood Culture adequate volume   Culture   Final    NO GROWTH 2 DAYS Performed at [name], [address], [place], [place] [address]    Report Status PENDING  Incomplete    Procedures and diagnostic studies:  No results found.  Medications:   . atorvastatin  10 mg Oral Daily  . Chlorhexidine Gluconate Cloth  6 each Topical Q0600  . Chlorhexidine Gluconate Cloth  6 each Topical Q0600  . heparin  5,000 Units Subcutaneous Q8H  . insulin aspart  0-5 Units Subcutaneous QHS  . insulin aspart  0-9 Units Subcutaneous TID WC  . insulin glargine  12 Units Subcutaneous Daily  . methadone  10 mg Oral TID  . nortriptyline  10 mg Oral QHS  . topiramate  50 mg Oral BID   Continuous Infusions: . sodium chloride    . sodium chloride       LOS: 3 days   Cythia Bachtel  [facility]     01/02/2020, 12:47 PM

## 2020-01-02 NOTE — Evaluation (Signed)
Occupational Therapy Evaluation Patient Details Name: Monica [name] MRN: [id number] DOB: 03-30-1951 Today's Date: 01/02/2020    History of Present Illness Pt is a 69 year old diabetic female who presented 5/20 with new renal failure, metabolic acidosis and acute metabolic encephalopathy.   Clinical Impression   Pt PTA: Pt living at home withs pouse and received assist for ADL/IADLs. Pt currently supervisionA to minguardA for mobility and ADL tasks. Pt stood at sink x3 mins for ADL tasks. Pt minA for bed mobility. t tolerating session well and reports pain "all over" when focused on, but usually easy to redirect. VSS. Pt would benefit from continued OT skilled services for poor activity tolerance, strength and safety. OT following acutely.      Follow Up Recommendations  Home health OT;Supervision - Intermittent    Equipment Recommendations  3 in 1 bedside commode    Recommendations for Other Services       Precautions / Restrictions Precautions Precautions: Fall Restrictions Weight Bearing Restrictions: No      Mobility Bed Mobility Overal bed mobility: Needs Assistance Bed Mobility: Supine to Sit     Supine to sit: Min assist     General bed mobility comments: rail and encouragement, slow with BLEs.  Transfers Overall transfer level: Needs assistance Equipment used: Rolling walker (2 wheeled) Transfers: Sit to/from [employer] Sit to Stand: Min guard Stand pivot transfers: Min guard       General transfer comment: RW for stability    Balance Overall balance assessment: Needs assistance Sitting-balance support: Feet supported;No upper extremity supported Sitting balance-Leahy Scale: Fair     Standing balance support: Single extremity supported;During functional activity Standing balance-Leahy Scale: Poor Standing balance comment: standing at sink for grooming                           ADL either performed or assessed with  clinical judgement   ADL Overall ADL's : At baseline                                       General ADL Comments: Pt performing grooming at sink in standing with supervisionA; pt ambulating to bathroom with RW and no physical assist; pt performing own pericare. Pt performing supervisionA for transfer to recliner. Pt tolerating session well and reports pain "all over" when focused on, but usually easy to redirect.     Vision Baseline Vision/History: No visual deficits Patient Visual Report: No change from baseline Vision Assessment?: No apparent visual deficits     Perception     Praxis      Pertinent Vitals/Pain Pain Assessment: Faces Faces Pain Scale: Hurts little more Pain Location: "all over" Pain Descriptors / Indicators: Discomfort;Grimacing;Guarding Pain Intervention(s): Monitored during session     Hand Dominance Right   Extremity/Trunk Assessment Upper Extremity Assessment Upper Extremity Assessment: Overall WFL for tasks assessed   Lower Extremity Assessment Lower Extremity Assessment: Defer to PT evaluation   Cervical / Trunk Assessment Cervical / Trunk Assessment: Kyphotic   Communication Communication Communication: No difficulties   Cognition Arousal/Alertness: Awake/alert Behavior During Therapy: WFL for tasks assessed/performed Overall Cognitive Status: Impaired/Different from baseline Area of Impairment: Problem solving                             Problem Solving: Slow processing;Requires verbal  cues General Comments: Pt following all commands, A/O x4 and requires cues for problem solving at EOB and when performing ADL tasks at sink- slow processing.   General Comments  O2  >90% O2 with HR 106-115BPM    Exercises     Shoulder Instructions      Home Living Family/patient expects to be discharged to:: Private residence Living Arrangements: Spouse/significant other Available Help at Discharge: Family;Available 24  hours/day Type of Home: House Home Access: Stairs to enter [employer] of Steps: 5 on side. 4 in front Entrance Stairs-Rails: Right Home Layout: Multi-level;Able to live on main level with bedroom/bathroom Alternate Level Stairs-Number of Steps: ~12 Alternate Level Stairs-Rails: Left Bathroom Shower/Tub: Tub/shower unit;Walk-in shower   Bathroom Toilet: Handicapped height Bathroom Accessibility: Yes How Accessible: Accessible via walker Home Equipment: [place] - 2 wheels;Kasandra Knudsen - single point      Lives With: Spouse    Prior Functioning/Environment Level of Independence: Needs assistance  Gait / Transfers Assistance Needed: ambulates with cane vs RW ADL's / Homemaking Assistance Needed: husband assists with bathing and dressing. Husband completes all iADLs.            OT Problem List: Decreased strength;Decreased activity tolerance;Impaired balance (sitting and/or standing);Pain;Decreased safety awareness      OT Treatment/Interventions: Self-care/ADL training;Therapeutic exercise;Energy conservation;DME and/or AE instruction;Therapeutic activities;Patient/family education;Balance training    OT Goals(Current goals can be found in the care plan section) Acute Rehab OT Goals Patient Stated Goal: to go home when stronger OT Goal Formulation: With patient Time For Goal Achievement: 01/16/20 Potential to Achieve Goals: Good ADL Goals Additional ADL Goal #1: Pt will recall and utilize 2-3 energy conservation strategies for ADL and mobility to carry over at home. Additional ADL Goal #2: Pt wil perform x10 mins of Pt will complete x10 mins of OOB ADL with no cues for (2) multistep commands.  OT Frequency: Min 2X/week   Barriers to D/C:            Co-evaluation              AM-PAC OT "6 Clicks" Daily Activity     Outcome Measure Help from another person eating meals?: None Help from another person taking care of personal grooming?: A Little Help from another  person toileting, which includes using toliet, bedpan, or urinal?: A Little Help from another person bathing (including washing, rinsing, drying)?: A Little Help from another person to put on and taking off regular upper body clothing?: None Help from another person to put on and taking off regular lower body clothing?: A Little 6 Click Score: 20   End of Session Equipment Utilized During Treatment: Gait belt;Rolling walker Nurse Communication: Mobility status  Activity Tolerance: Patient tolerated treatment well Patient left: in chair;with call bell/phone within reach  OT Visit Diagnosis: Unsteadiness on feet (R26.81);Muscle weakness (generalized) (M62.81)                Time: 4128-7867 OT Time Calculation (min): 40 min Charges:  OT General Charges $OT Visit: 1 Visit OT Evaluation $OT Eval Moderate Complexity: 1 Mod OT Treatments $Self Care/Home Management : 8-22 mins $Therapeutic Activity: 8-22 mins  Jefferey Pica, OTR/L Acute Rehabilitation Services Pager: [phone] Office: [phone]  Monica [name] C 01/02/2020, 2:28 PM

## 2020-01-02 NOTE — Progress Notes (Signed)
Inpatient Rehab Admissions Coordinator:  Noted Attending's note that pt had changed her mind and no longer wanted to pursue CIR, instead wanted to go home.  Met with pt and husband at bedside. Pt expressed she desired to receive therapy at home instead of remaining in hospital.  Husband acknowledged he would follow pt's wishes.  TOC notified.  Will sign off on the pt.  Gayland Curry, [place], [place] Admissions Coordinator [phone]

## 2020-01-03 ENCOUNTER — Other Ambulatory Visit: Payer: Medicare Other

## 2020-01-03 ENCOUNTER — Inpatient Hospital Stay: Admission: RE | Admit: 2020-01-03 | Payer: Medicare Other | Source: Ambulatory Visit

## 2020-01-03 LAB — CBC
HCT: 30.2 % — ABNORMAL LOW (ref 36.0–46.0)
Hemoglobin: 10.1 g/dL — ABNORMAL LOW (ref 12.0–15.0)
MCH: 29.8 pg (ref 26.0–34.0)
MCHC: 33.4 g/dL (ref 30.0–36.0)
MCV: 89.1 fL (ref 80.0–100.0)
Platelets: 107 10*3/uL — ABNORMAL LOW (ref 150–400)
RBC: 3.39 MIL/uL — ABNORMAL LOW (ref 3.87–5.11)
RDW: 13.2 % (ref 11.5–15.5)
WBC: 8 10*3/uL (ref 4.0–10.5)
nRBC: 0 % (ref 0.0–0.2)

## 2020-01-03 LAB — BASIC METABOLIC PANEL
Anion gap: 12 (ref 5–15)
BUN: 23 mg/dL (ref 8–23)
CO2: 22 mmol/L (ref 22–32)
Calcium: 8.8 mg/dL — ABNORMAL LOW (ref 8.9–10.3)
Chloride: 101 mmol/L (ref 98–111)
Creatinine, Ser: 1.17 mg/dL — ABNORMAL HIGH (ref 0.44–1.00)
GFR calc Af Amer: 56 mL/min — ABNORMAL LOW (ref >60)
GFR calc non Af Amer: 48 mL/min — ABNORMAL LOW (ref >60)
Glucose, Bld: 256 mg/dL — ABNORMAL HIGH (ref 70–99)
Potassium: 3.6 mmol/L (ref 3.5–5.1)
Sodium: 135 mmol/L (ref 135–145)

## 2020-01-03 LAB — MAGNESIUM: Magnesium: 1.5 mg/dL — ABNORMAL LOW (ref 1.7–2.4)

## 2020-01-03 LAB — GLUCOSE, CAPILLARY
Glucose-Capillary: 221 mg/dL — ABNORMAL HIGH (ref 70–99)
Glucose-Capillary: 239 mg/dL — ABNORMAL HIGH (ref 70–99)

## 2020-01-03 MED ORDER — MAGNESIUM SULFATE 2 GM/50ML IV SOLN
2.0000 g | Freq: Once | INTRAVENOUS | Status: AC
  Administered 2020-01-03: 2 g via INTRAVENOUS
  Filled 2020-01-03: qty 50

## 2020-01-03 MED ORDER — MAGNESIUM OXIDE 400 (241.3 MG) MG PO TABS: 400.0000 mg | ORAL_TABLET | Freq: Two times a day (BID) | ORAL | 0 refills | Status: DC

## 2020-01-03 NOTE — Progress Notes (Signed)
Physical Therapy Treatment Patient Details Name: TOSHIBA NULL MRN: [id number] DOB: 08-31-1950 Today's Date: 01/03/2020    History of Present Illness Pt is a 69 year old diabetic female who presented 5/20 with new renal failure, metabolic acidosis and acute metabolic encephalopathy.    PT Comments    Pt received sitting EOB. Min guard assist sit to stand and min guard assist ambulation 15' x 2 with RW. Pt declining hallway ambulation. Pt in bed at end of session. Pt has changed her mind regarding CIR and now wants to go home with therapy. Husband present in room and confirms ability to provide needed level of assist. D/C plan updated to HHPT and 24-hour assist.     Follow Up Recommendations  Home health PT;Supervision/Assistance - 24 hour     Equipment Recommendations  None recommended by PT    Recommendations for Other Services       Precautions / Restrictions Precautions Precautions: Fall Restrictions Weight Bearing Restrictions: No    Mobility  Bed Mobility Overal bed mobility: Needs Assistance Bed Mobility: Sit to Supine       Sit to supine: Min assist;HOB elevated   General bed mobility comments: assist with RLE into bed. Increased time  Transfers Overall transfer level: Needs assistance Equipment used: Rolling walker (2 wheeled) Transfers: Sit to/from Stand Sit to Stand: Min guard         General transfer comment: Cues for hand placement when standing from low surface.  Ambulation/Gait Ambulation/Gait assistance: Min guard Gait Distance (Feet): 15 Feet(x 2) Assistive device: Rolling walker (2 wheeled) Gait Pattern/deviations: Step-to pattern;Decreased stride length Gait velocity: decreased Gait velocity interpretation: <1.8 ft/sec, indicate of risk for recurrent falls General Gait Details: increased time to progress RLE through swing phase   Stairs             Wheelchair Mobility    Modified Rankin (Stroke Patients Only)        Balance Overall balance assessment: Needs assistance Sitting-balance support: Feet supported;No upper extremity supported Sitting balance-Leahy Scale: Fair     Standing balance support: During functional activity;Bilateral upper extremity supported;Single extremity supported Standing balance-Leahy Scale: Poor Standing balance comment: reliant on UE support                            Cognition Arousal/Alertness: Awake/alert Behavior During Therapy: WFL for tasks assessed/performed Overall Cognitive Status: Impaired/Different from baseline Area of Impairment: Problem solving                             Problem Solving: Slow processing;Requires verbal cues;Difficulty sequencing        Exercises      General Comments General comments (skin integrity, edema, etc.): VSS      Pertinent Vitals/Pain Pain Assessment: Faces Faces Pain Scale: Hurts a little bit Pain Location: generalized Pain Descriptors / Indicators: Discomfort Pain Intervention(s): Monitored during session;Repositioned    Home Living                      Prior Function            PT Goals (current goals can now be found in the care plan section) Acute Rehab PT Goals Patient Stated Goal: home Progress towards PT goals: Progressing toward goals    Frequency    Min 3X/week      PT Plan Discharge plan needs to be updated  Co-evaluation              AM-PAC PT "6 Clicks" Mobility   Outcome Measure  Help needed turning from your back to your side while in a flat bed without using bedrails?: None Help needed moving from lying on your back to sitting on the side of a flat bed without using bedrails?: A Little Help needed moving to and from a bed to a chair (including a wheelchair)?: A Little Help needed standing up from a chair using your arms (e.g., wheelchair or bedside chair)?: A Little Help needed to walk in hospital room?: A Little Help needed climbing 3-5  steps with a railing? : A Lot 6 Click Score: 18    End of Session Equipment Utilized During Treatment: Gait belt Activity Tolerance: Patient tolerated treatment well Patient left: in bed;with call bell/phone within reach;with family/visitor present Nurse Communication: Mobility status PT Visit Diagnosis: Other abnormalities of gait and mobility (R26.89);Muscle weakness (generalized) (M62.81);History of falling (Z91.81)     Time: 0263-7858 PT Time Calculation (min) (ACUTE ONLY): 28 min  Charges:  $Gait Training: 23-37 mins                     Lorrin Goodell, [place]  Office # [phone] Pager [place], [place] 01/03/2020, 11:14 AM

## 2020-01-03 NOTE — Discharge Summary (Signed)
Physician Discharge Summary  Monica [name] JQB:[id number] DOB: Dec 30, 1950 DOA: 12/30/2019  PCP: Loman Brooklyn, FNP  Admit date: 12/30/2019 Discharge date: 01/03/2020  Discharge disposition: Home with home health   Recommendations for Outpatient Follow-Up:   Follow up with PCP in 1 week Follow up with neurologist in 1 to 2 weeks   Discharge Diagnosis:   Principal Problem:   Renal failure Active Problems:   Encounter for central line placement    Discharge Condition: Stable.  Diet recommendation: Heart healthy and diabetic diet  Code status: Full code.    Hospital Course:   Monica [name] is an 69 y.o. female with a history of chronic pain on NSAIDs and methadone followed by Dr. Naaman Plummer, occipital neuralgia, progressive weakness since Jan 2021 undergoing outpatient work up, nephrolithiasis s/p ESWL 12/13/2019 who presented to the ED with worsening weakness, lethargy found to be lethargic with significant acidosis and acute renal failure (creatinine 7.15 from baseline of 0.9). HD catheter was placed and HD performed in the ICU. With improving metabolic parameters and urine output, mentation improved and she was transferred to hospitalist service 01/01/2020. Renal function improved close to baseline.  She was seen in consultation by PT and OT who recommended further rehabilitation at the inpatient rehab center.  However patient declined to go to the inpatient rehab center and preferred to go home with home health therapy.  Her condition was improved and she is deemed stable for discharge to home.  Her husband was concerned about her progressive decline in health and wondered whether patient could have conditions like "MS, ALS or Lyme disease".  Patient was encouraged to follow-up with her PCP or outpatient neurologist for further evaluation.  She has also been advised to monitor her glucose closely and follow-up with PCP for hypoglycemia agents to be adjusted  accordingly.      Discharge Exam:   Vitals:   01/03/20 0447 01/03/20 0747  BP: (!) 119/96 116/75  Pulse: (!) 107 92  Resp: 20 19  Temp: 97.7 F (36.5 C) 98.3 F (36.8 C)  SpO2: 100% 95%   Vitals:   01/02/20 2050 01/03/20 0016 01/03/20 0447 01/03/20 0747  BP: 137/78 (!) 109/58 (!) 119/96 116/75  Pulse: (!) 114 (!) 50 (!) 107 92  Resp: _0 Temp: 97.7 F (36.5 C) 97.6 F (36.4 C) 97.7 F (36.5 C) 98.3 F (36.8 C)  TempSrc: Oral     SpO2: 98% 99% 100% 95%  Weight:      Height:         GEN: NAD SKIN: No rash EYES: EOMI ENT: MMM CV: RRR PULM: CTA B ABD: soft, ND, NT, +BS CNS: AAO x 3, non focal EXT: No edema or tenderness   The results of significant diagnostics from this hospitalization (including imaging, microbiology, ancillary and laboratory) are listed below for reference.     Procedures and Diagnostic Studies:   CT Head Wo Contrast  Result Date: 12/30/2019 CLINICAL DATA:  Altered mental status EXAM: CT HEAD WITHOUT CONTRAST TECHNIQUE: Contiguous axial images were obtained from the base of the skull through the vertex without intravenous contrast. COMPARISON:  None. FINDINGS: Brain: There is no acute intracranial hemorrhage, mass effect, or edema. Gray-white differentiation is preserved. There is no extra-axial fluid collection. Ventricles and sulci are within normal limits in size and configuration. Vascular: There is atherosclerotic calcification at the skull base. Skull: Postoperative changes of prior bifrontal craniotomy. Sinuses/Orbits: No acute finding. Other: None. IMPRESSION: No  acute intracranial abnormality. Electronically Signed   By: Macy Mis M.D.   On: 12/30/2019 13:09   CT Cervical Spine Wo Contrast  Result Date: 12/30/2019 CLINICAL DATA:  Progressive altered mental status, generalized weakness EXAM: CT CERVICAL SPINE WITHOUT CONTRAST TECHNIQUE: Multidetector CT imaging of the cervical spine was performed without intravenous  contrast. Multiplanar CT image reconstructions were also generated. COMPARISON:  None. FINDINGS: Alignment: No significant listhesis. Skull base and vertebrae: There are postoperative changes of prior anterior fusion at C5-C7 with plate and screw fixation and well incorporated interbody grafts. No evidence of loosening. Vertebral body heights are maintained. Soft tissues and spinal canal: No prevertebral soft tissue swelling or visible epidural collection. Disc levels: There are bridging posterior endplate osteophytes at the operative levels and at C3-C4 and C4-C5. Multilevel uncovertebral and facet hypertrophy. There is fusion of the multiple left facets. There is no significant osseous encroachment on the spinal canal. Foraminal stenosis is greatest on the left at C3-C4 and C4-C5. Upper chest: Negative. Other: None. IMPRESSION: Postoperative changes at C5 and C7 without evidence of complication. No high-grade canal stenosis. Electronically Signed   By: Macy Mis M.D.   On: 12/30/2019 13:22   US RENAL  Result Date: 12/30/2019 CLINICAL DATA:  History of nephrolithiasis.  Renal failure. EXAM: RENAL / URINARY TRACT ULTRASOUND COMPLETE COMPARISON:  None. FINDINGS: Right Kidney: Renal measurements: 10.3 x 4.7 x 5.2 cm = volume: 132 mL . Echogenicity within normal limits. No mass or hydronephrosis visualized. Left Kidney: Renal measurements: 10.7 x 5.2 x 6.3 cm = volume: 184 mL. 7 mm calculus near the left lower pole. No hydronephrosis. Normal echogenicity. Bladder: Decompressed by Foley catheter. Other: None. IMPRESSION: No hydronephrosis.  Nonobstructive left lower pole renal calculus. Electronically Signed   By: Ulyses Jarred M.D.   On: 12/30/2019 17:57   DG Chest Port 1 View  Result Date: 12/30/2019 CLINICAL DATA:  Central catheter placement EXAM: PORTABLE CHEST 1 VIEW COMPARISON:  Dec 30, 2019 study obtained earlier in the day FINDINGS: Central catheter tip is in the superior vena cava. No pneumothorax.  The lungs are clear. Heart size and pulmonary vascularity are normal. No adenopathy. 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 There is postoperative change in the lower cervical spine. IMPRESSION: Central catheter tip in superior vena cava. No pneumothorax. Lungs clear. Cardiac silhouette normal. 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 Electronically Signed   By: Lowella Grip III M.D.   On: 12/30/2019 16:02   DG Chest Port 1 View  Result Date: 12/30/2019 CLINICAL DATA:  Renal failure EXAM: PORTABLE CHEST 1 VIEW COMPARISON:  11/18/2019 FINDINGS: Cardiac shadows within normal limits. Lungs are well aerated bilaterally. No focal infiltrate or sizable effusion is seen. Postsurgical changes in the cervical spine are noted. Multiple calcified loose bodies are noted within the left shoulder joint. IMPRESSION: No acute abnormality noted. Electronically Signed   By: Inez Catalina M.D.   On: 12/30/2019 12:12   CT Renal Stone Study  Result Date: 12/30/2019 CLINICAL DATA:  Altered mental status, history of renal stone EXAM: CT ABDOMEN AND PELVIS WITHOUT CONTRAST TECHNIQUE: Multidetector CT imaging of the abdomen and pelvis was performed following the standard protocol without IV contrast. COMPARISON:  12/07/2019 FINDINGS: Lower chest: No acute abnormality. Hepatobiliary: No focal liver abnormality is seen. Status post cholecystectomy. No biliary dilatation. Pancreas: Unremarkable. Spleen: Unremarkable. Adrenals/Urinary Tract: The adrenals are unremarkable. Unchanged nonobstructing left renal calculi measuring up to 9 mm at the lower pole. Resolution of hydronephrosis. An  11 x 3 mm calculus is now present within the distal left ureter approximately 6 cm above the ureterovesical junction. Bladder is unremarkable. Stomach/Bowel: Hiatal hernia is again noted. Bowel is normal in caliber. Vascular/Lymphatic: Aortic atherosclerosis. No enlarged abdominal or pelvic lymph nodes. Reproductive: Uterus and  bilateral adnexa are unremarkable. Other: No ascites. Musculoskeletal: No acute osseous abnormality IMPRESSION: 11 x 3 mm calculus within the distal left ureter approximately 6 cm above the UVJ. Resolution of proximal hydroureteronephrosis. Unchanged nonobstructing left renal calculi. Hiatal hernia. Electronically Signed   By: Macy Mis M.D.   On: 12/30/2019 13:16     Labs:   Basic Metabolic Panel: Recent Labs  Lab 12/30/19 0948 12/30/19 0948 12/30/19 1217 12/30/19 1555 12/30/19 1555 12/31/19 0040 12/31/19 0431 12/31/19 0431 12/31/19 1132 12/31/19 1132 01/01/20 0444 01/01/20 0444 01/01/20 1918 01/01/20 1918 01/02/20 0641 01/03/20 0246  NA 133*  --    < > 137   < >  --  139   < > 133*  --  140  --  136  --  137 135  K 3.8   < >   < > 3.2*   < > 2.4* 2.9*   < > 3.5   < > 2.7*   < > 4.0   < > 4.6 3.6  CL 97*  --    < > 104   < >  --  103   < > 101  --  107  --  103  --  109 101  CO2 10*  --    < > 10*   < >  --  20*   < > 20*  --  21*  --  24  --  22 22  GLUCOSE 163*  --    < > 59*   < >  --  95   < > 223*  --  255*  --  440*  --  294* 256*  BUN 165*  --    < > 160*   < >  --  58*   < > 52*  --  32*  --  25*  --  25* 23  CREATININE 7.15*  --    < > 6.37*   < >  --  2.41*   < > 2.02*  --  1.28*  --  1.13*  --  1.02* 1.17*  CALCIUM 7.8*  --    < > 6.8*   < >  --  7.3*   < > 6.6*  --  7.0*  --  7.3*  --  7.8* 8.8*  MG 1.5*   < >  --   --   --  1.1* 2.4  --   --   --  1.4*  --   --   --  1.4* 1.5*  PHOS 8.4*  --   --  7.0*  --   --  1.7*  --   --   --  1.9*  --   --   --   --   --    < > = values in this interval not displayed.   GFR Estimated Creatinine Clearance: 40.3 mL/min (A) (by C-G formula based on SCr of 1.17 mg/dL (H)). Liver Function Tests: Recent Labs  Lab 12/30/19 1111 12/30/19 1555 12/31/19 0431  AST 6*  --   --   ALT 13  --   --   ALKPHOS 94  --   --   BILITOT 1.7*  --   --  PROT 7.1  --   --   ALBUMIN 3.8 3.0* 3.1*   No results for input(s): LIPASE,  AMYLASE in the last 168 hours. Recent Labs  Lab 12/30/19 1046  AMMONIA 29   Coagulation profile Recent Labs  Lab 12/30/19 1207  INR 1.4*    CBC: Recent Labs  Lab 12/30/19 0948 12/30/19 0948 12/30/19 1217 12/30/19 1531 12/31/19 0431 01/02/20 0641 01/03/20 0246  WBC 11.1*  --   --  9.1 8.7 5.5 8.0  HGB 13.3   < > 12.9 10.8* 11.0* 9.5* 10.1*  HCT 38.5   < > 38.0 31.4* 30.3* 28.6* 30.2*  MCV 86.5  --   --  86.7 83.5 88.5 89.1  PLT 146*  --   --  97* 87* 84* 107*   < > = values in this interval not displayed.   Cardiac Enzymes: Recent Labs  Lab 12/30/19 1333  CKTOTAL 53   BNP: Invalid input(s): POCBNP CBG: Recent Labs  Lab 01/02/20 1213 01/02/20 1604 01/02/20 2031 01/03/20 0750 01/03/20 1219  GLUCAP 332* 241* 320* 221* 239*   D-Dimer No results for input(s): DDIMER in the last 72 hours. Hgb A1c No results for input(s): HGBA1C in the last 72 hours. Lipid Profile No results for input(s): CHOL, HDL, LDLCALC, TRIG, CHOLHDL, LDLDIRECT in the last 72 hours. Thyroid function studies No results for input(s): TSH, T4TOTAL, T3FREE, THYROIDAB in the last 72 hours.  Invalid input(s): FREET3 Anemia work up No results for input(s): VITAMINB12, FOLATE, FERRITIN, TIBC, IRON, RETICCTPCT in the last 72 hours. Microbiology Recent Results (from the past 240 hour(s))  Culture, blood (routine x 2)     Status: None (Preliminary result)   Collection Time: 12/30/19 11:00 AM   Specimen: BLOOD RIGHT ARM  Result Value Ref Range Status   Specimen Description BLOOD RIGHT ARM  Final   Special Requests   Final    BOTTLES DRAWN AEROBIC AND ANAEROBIC Blood Culture results may not be optimal due to an inadequate volume of blood received in culture bottles   Culture   Final    NO GROWTH 4 DAYS Performed at [facility], [place] [address] [place], [place] [address]    Report Status PENDING  Incomplete  Urine culture     Status: None   Collection Time: 12/30/19  1:35 PM    Specimen: Urine, Random  Result Value Ref Range Status   Specimen Description URINE, RANDOM  Final   Special Requests NONE  Final   Culture   Final    NO GROWTH Performed at [facility], [address], [place],  [address]    Report Status 12/31/2019 FINAL  Final  SARS Coronavirus 2 by RT PCR (hospital order, performed in [facility] hospital lab) Nasopharyngeal Nasopharyngeal Swab     Status: None   Collection Time: 12/30/19  1:46 PM   Specimen: Nasopharyngeal Swab  Result Value Ref Range Status   SARS Coronavirus 2 NEGATIVE NEGATIVE Final    Comment: (NOTE) SARS-CoV-2 target nucleic acids are NOT DETECTED. The SARS-CoV-2 RNA is generally detectable in upper and lower respiratory specimens during the acute phase of infection. The lowest concentration of SARS-CoV-2 viral copies this assay can detect is 250 copies / mL. A negative result does not preclude SARS-CoV-2 infection and should not be used as the sole basis for treatment or other patient management decisions.  A negative result may occur with improper specimen collection / handling, submission of specimen other than nasopharyngeal swab,  presence of viral mutation(s) within the areas targeted by this assay, and inadequate number of viral copies (<250 copies / mL). A negative result must be combined with clinical observations, patient history, and epidemiological information. Fact Sheet for Patients:   StrictlyIdeas.no Fact Sheet for Healthcare Providers: BankingDealers.co.za This test is not yet approved or cleared  by the [place] FDA and has been authorized for detection and/or diagnosis of SARS-CoV-2 by FDA under an Emergency Use Authorization (EUA).  This EUA will remain in effect (meaning this test can be used) for the duration of the COVID-19 declaration under Section 564(b)(1) of the Act, 21 U.S.C. section 360bbb-3(b)(1), unless the authorization is  terminated or revoked sooner. Performed at [facility], [place] [address] [place], [place] [address]   Culture, blood (routine x 2)     Status: None (Preliminary result)   Collection Time: 12/30/19  8:40 PM   Specimen: BLOOD  Result Value Ref Range Status   Specimen Description BLOOD LEFT HAND  Final   Special Requests   Final    BOTTLES DRAWN AEROBIC ONLY Blood Culture adequate volume   Culture   Final    NO GROWTH 4 DAYS Performed at [facility], [place] [address], [place], [place] [address]    Report Status PENDING  Incomplete     Discharge Instructions:   Discharge Instructions    Diet - low sodium heart healthy   Complete by: As directed    Discharge instructions   Complete by: As directed    Avoid NSAIDs like ibuprofen, naproxen, if possible because of recent kidney injury.  Follow-up with PCP to determine whether NSAIDs can be resumed after complete recovery of kidney function.   Face-to-face encounter (required for Medicare/Medicaid patients)   Complete by: As directed    I Miklos Bidinger certify that this patient is under my care and that I, or a nurse practitioner or physician's assistant working with me, had a face-to-face encounter that meets the physician face-to-face encounter requirements with this patient on 01/03/2020. The encounter with the patient was in whole, or in part for the following medical condition(s) which is the primary reason for home health care (List medical condition): Cervical spondylosis, chronic neck pain, debility   The encounter with the patient was in whole, or in part, for the following medical condition, which is the primary reason for home health care: Cervical spondylosis, chronic neck pain, debility   I certify that, based on my findings, the following services are medically necessary home health services: Physical therapy   Reason for Medically Necessary Home Health Services: Therapy- Personnel officer, Horticulturist, commercial   My clinical findings support the need for the above services: Unable to leave home safely without assistance and/or assistive device   Further, I certify that my clinical findings support that this patient is homebound due to: Unable to leave home safely without assistance   For home use only DME 3 n 1   Complete by: As directed    Home Health   Complete by: As directed    To provide the following care/treatments:  PT OT     Increase activity slowly   Complete by: As directed      Allergies as of 01/03/2020      Reactions   Aspirin Anaphylaxis, Hives   REACTION: slurred speech, blurred vision   Hydromorphone Hcl Shortness Of Breath   severe   Vimpat [lacosamide] Other (See Comments)   Hallucinations,  out of body experience, confusion    Capsaicin    Red rash   Depakote [divalproex Sodium]    Milnacipran Hcl Other (See Comments)   dizziness   Pristiq [desvenlafaxine Succinate Monohydrate] Other (See Comments)   dizziness   Gabapentin    States leg pain worsened, pt d/c'd      Medication List    STOP taking these medications   cephALEXin 500 MG capsule Commonly known as: KEFLEX   ibuprofen 200 MG tablet Commonly known as: ADVIL   naproxen 500 MG tablet Commonly known as: NAPROSYN     TAKE these medications   atorvastatin 10 MG tablet Commonly known as: LIPITOR Take 1 tablet (10 mg total) by mouth daily.   blood glucose meter kit and supplies Kit Dispense based on patient and insurance preference. Use up to four times daily as directed. (FOR ICD-9 250.00, 250.01).   glipiZIDE 5 MG tablet Commonly known as: GLUCOTROL Take 1 tablet (5 mg total) by mouth daily before breakfast.   magnesium oxide 400 (241.3 Mg) MG tablet Commonly known as: MAG-OX Take 1 tablet (400 mg total) by mouth 2 (two) times daily.   metFORMIN 500 MG tablet Commonly known as: GLUCOPHAGE Take 2 tablets (1,000 mg total) by mouth 2 (two) times daily.   methadone 10 MG  tablet Commonly known as: DOLOPHINE Take 2 tablets (20 mg total) by mouth 3 (three) times daily.   nortriptyline 25 MG capsule Commonly known as: Pamelor Take 1 capsule (25 mg total) by mouth at bedtime.   ondansetron 4 MG tablet Commonly known as: ZOFRAN Take 1 tablet (4 mg total) by mouth every 8 (eight) hours as needed for nausea or vomiting. What changed: Another medication with the same name was removed. Continue taking this medication, and follow the directions you see here.   tapentadol 50 MG tablet Commonly known as: Nucynta Take 1 tablet (50 mg total) by mouth daily as needed (migraines).   tiZANidine 2 MG tablet Commonly known as: ZANAFLEX TAKE (1) TABLET EVERY SIX HOURS AS NEEDED FOR MUSCLE SPASMS. What changed: See the new instructions.   topiramate 50 MG tablet Commonly known as: TOPAMAX TAKE 2 TABLETS IN THE MORNING AND 4 TABLETS AT BEDTIME. What changed: See the new instructions.            Durable Medical Equipment  (From admission, onward)         Start     Ordered   01/03/20 0000  For home use only DME 3 n 1     01/03/20 1159            Time coordinating discharge: 33 minutes  Signed:  Coraleigh Sheeran  [facility] 01/03/2020, 9:04 PM

## 2020-01-03 NOTE — TOC Initial Note (Signed)
Transition of Care [facility]) - Initial/Assessment Note    Patient Details  Name: Monica [name] MRN: [id number] Date of Birth: 1951/06/14  Transition of Care [facility]) CM/SW Contact:    Bethann Berkshire, [place] Phone Number: 01/03/2020, 1:02 PM  Clinical Narrative:                  Met with pt and her husband bedside. Pt confirms desire to go home with [facility] instead of CIR. Lives at home with husband who is able to assist with care giving. Pt has rolling walker and cane; agree for CSW to arrange 3-in-1 DME. Pt and husband agreeable for CSW to arrange [facility]. CSW arranged 3 in 1 with Zack from [employer] and [facility] PT/OT with Malachy Mood from [facility]   Expected Discharge Plan: [place] Barriers to Discharge: No Barriers Identified   Patient Goals and CMS Choice Patient states their goals for this hospitalization and ongoing recovery are:: Return home and get healthy      Expected Discharge Plan and Services Expected Discharge Plan: [place] In-house Referral: Clinical Social Work   Post Acute Care Choice: Durable Medical Equipment, Home Health Living arrangements for the past 2 months: Single Family Home                 DME Arranged: 3-N-1 DME Agency: [employer] Date DME Agency Contacted: 01/03/20   Representative spoke with at DME Agency: zack HH Arranged: PT, OT [place] Agency: [place] Date [place]: 01/03/20 Time HH Agency Contacted: 81 Representative spoke with at [place]: Cheryle  Prior Living Arrangements/Services Living arrangements for the past 2 months: [place] with:: Spouse Patient language and need for interpreter reviewed:: Yes Do you feel safe going back to the place where you live?: Yes      Need for Family Participation in Patient Care: Yes (Comment) Care giver support system in place?: Yes (comment) Current home services: DME(Rolling walker and cane) Criminal Activity/Legal Involvement Pertinent to  Current Situation/Hospitalization: No - Comment as needed  Activities of Daily Living      Permission Sought/Granted                  Emotional Assessment Appearance:: Appears stated age Attitude/Demeanor/Rapport: Lethargic Affect (typically observed): Flat Orientation: : Oriented to Self, Oriented to Place, Oriented to  Time, Oriented to Situation Alcohol / Substance Use: Not Applicable Psych Involvement: No (comment)  Admission diagnosis:  Metabolic acidosis [F68.1] Renal failure [N19] Weakness [R53.1] Encounter for central line placement [Z45.2] Acute renal failure, unspecified acute renal failure type ([place]) [N17.9] Altered mental status, unspecified altered mental status type [R41.82] Patient Active Problem List   Diagnosis Date Noted  . Renal failure 12/30/2019  . Lower extremity weakness 10/19/2019  . Arthritis of right sacroiliac joint 08/18/2019  . Altered bowel habits 08/03/2019  . Trochanteric bursitis, left hip 07/20/2019  . Uncontrolled type 2 diabetes mellitus with hyperglycemia, without long-term current use of insulin ([place]) 06/17/2019  . PTSD (post-traumatic stress disorder) 10/15/2016  . Encounter for central line placement 10/06/2015  . Encounter for therapeutic drug level monitoring 10/02/2015  . Occipital neuralgia (Location of Primary Source of Pain) (Bilateral) (L>R) 10/02/2015  . Chronic cervical radicular pain (Left) 08/31/2015  . 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 Cervical facet arthropathy (severe at C3-4) (Bilateral) (L>R) 07/26/2015  . Cervical foraminal stenosis (C3-4) (Left) 07/26/2015  . Cervical spondylosis 06/13/2015  . Cervical  facet syndrome (Location of Secondary source of pain) (Bilateral) (L>R) 06/13/2015  . Chronic pain 06/13/2015  . Cervicogenic headache (Location of Primary Source of Pain) (Bilateral) (L>R) 06/13/2015  . Chronic neck pain (Location of Secondary source of pain) (Bilateral) (L>R)  06/13/2015  . Long term current use of opiate analgesic 06/13/2015  . Opiate use (600 MME/Day) 06/13/2015  . Opiate dependence ([place]) 06/13/2015  . Myofascial muscle pain 04/19/2013  . Dyspnea 02/10/2013  . Intractable chronic migraine without aura and with status migrainosus 04/22/2012  . Depression 04/22/2012  . Borderline personality disorder ([place]) 04/22/2012  . GERD with esophagitis 05/31/2011   PCP:  Loman Brooklyn, FNP Pharmacy:   [place], [place] [address] [place] [address] Phone: [phone] Fax: [phone]     Social Determinants of Health (SDOH) Interventions    Readmission Risk Interventions No flowsheet data found.

## 2020-01-03 NOTE — Care Management Important Message (Signed)
Important Message  Patient Details  Name: Monica [name] MRN: [id number] Date of Birth: Mar 08, 1951   Medicare Important Message Given:  Yes     [name] 01/03/2020, 1:05 PM

## 2020-01-03 NOTE — Progress Notes (Signed)
Discharge instructions reviewed with patient and husband, questions were encouraged during review. Understanding was verbalized by both patient and her husband. IV was removed prior to patient being discharged. 3-in-1 delivered to patient's room prior to discharge.

## 2020-01-04 LAB — CULTURE, BLOOD (ROUTINE X 2)
Culture: NO GROWTH
Culture: NO GROWTH
Special Requests: ADEQUATE

## 2020-01-05 ENCOUNTER — Telehealth: Payer: Self-pay | Admitting: Neurology

## 2020-01-05 ENCOUNTER — Telehealth: Payer: Self-pay | Admitting: Family Medicine

## 2020-01-05 NOTE — Telephone Encounter (Signed)
hosp f/u appt made for 01/11/20 with Britney Joyce,FNP

## 2020-01-05 NOTE — Telephone Encounter (Signed)
3-day-EEG did not show any seizure activity or epileptiform activity or any indication of a seizure focus. thanks

## 2020-01-06 ENCOUNTER — Telehealth (INDEPENDENT_AMBULATORY_CARE_PROVIDER_SITE_OTHER): Payer: Medicare Other | Admitting: Cardiovascular Disease

## 2020-01-06 ENCOUNTER — Encounter: Payer: Self-pay | Admitting: Cardiovascular Disease

## 2020-01-06 ENCOUNTER — Other Ambulatory Visit: Payer: Self-pay

## 2020-01-06 DIAGNOSIS — R0602 Shortness of breath: Secondary | ICD-10-CM

## 2020-01-06 DIAGNOSIS — R600 Localized edema: Secondary | ICD-10-CM | POA: Diagnosis not present

## 2020-01-06 DIAGNOSIS — R531 Weakness: Secondary | ICD-10-CM

## 2020-01-06 DIAGNOSIS — I499 Cardiac arrhythmia, unspecified: Secondary | ICD-10-CM

## 2020-01-06 DIAGNOSIS — R079 Chest pain, unspecified: Secondary | ICD-10-CM

## 2020-01-06 NOTE — Patient Instructions (Signed)
Medication Instructions: Your physician recommends that you continue on your current medications as directed. Please refer to the Current Medication list given to you today.   Labwork: None today  Procedures/Testing: Your physician has requested that you have an echocardiogram. Echocardiography is a painless test that uses sound waves to create images of your heart. It provides your doctor with information about the size and shape of your heart and how well your heart's chambers and valves are working. This procedure takes approximately one hour. There are no restrictions for this procedure.    Follow-Up: 3 months office visit or telephone visit with the Physician Assistant   Any Additional Special Instructions Will Be Listed Below (If Applicable).     If you need a refill on your cardiac medications before your next appointment, please call your pharmacy.        Thank you for choosing [facility] !

## 2020-01-06 NOTE — Telephone Encounter (Addendum)
Spoke with pt's husband [name] and notified that the home EEG did not show any seizure of epileptiform activity or any indication of a seizure focus. He said the pt could not tolerate it long. He was upset that he got a letter about a missed appt. They were late to the appt and then pt went to the hospital. I apologized to him and advised this was an automatic letter and they are not being dismissed from the office. He verbalized appreciation. Pt is home now and is going to do outpatient therapy. I answered his questions. He was unaware a CT head and c-spine were done in the hospital. He will call Dr. Hermelinda Medicus office tomorrow to discuss as there is still a pending order. He also went ahead and scheduled pt's EMG for Monday 6/28 @ 2:00 pm arrival 1:30 pm and was placed on the wait list. He verbalized appreciation for the call.

## 2020-01-06 NOTE — Progress Notes (Signed)
Virtual Visit via Telephone Note   This visit type was conducted due to national recommendations for restrictions regarding the COVID-19 Pandemic (e.g. social distancing) in an effort to limit this patient's exposure and mitigate transmission in our community.  Due to her co-morbid illnesses, this patient is at least at moderate risk for complications without adequate follow up.  This format is felt to be most appropriate for this patient at this time.  The patient did not have access to video technology/had technical difficulties with video requiring transitioning to audio format only (telephone).  All issues noted in this document were discussed and addressed.  No physical exam could be performed with this format.  Please refer to the patient's chart for her  consent to telehealth for [facility]   The patient was identified using 2 identifiers.  Date:  01/06/2020   ID:  Monica, [name] [birth year], MRN [id number]  Patient Location: Home Provider Location: Home  PCP:  Monica Fudge, FNP  Cardiologist:  Monica Docker, MD  Electrophysiologist:  None   Evaluation Performed:  Follow-Up Visit  Chief Complaint:  Post-hospitalization follow up  History of Present Illness:    Monica [name] is a 69 y.o. female with arrhythmia and chest pain.  She was discharged on 01/03/2020 after being hospitalized for worsening weakness, lethargy, and acidosis with acute renal failure.  Creatinine was 7.15 from a baseline of 0.9.  She underwent hemodialysis and mentation improved.  She has weakness of right leg and has bilateral ankle swelling. I spoke with her husband.  He said she is doing better neurologically and is coherent again.  She denies palpitations. She does complain of shortness of breath but she is not visibly short of breath.   Social history: Originally from [name] in the [place] area [place].  They plan to move back there later this  year as  that is where their children are.  They moved to [name] approximately 20 years ago as her husband managed a computer company.  Past Medical History:  Diagnosis Date  . Arthritis   . Cervical facet joint syndrome 04/22/2012  . Cervicalgia   . Chronic headaches    Dr [name] [facility]  . Chronic nausea    normal GES   . Complication of anesthesia    had problem with local anesthesia-tried to assist Physician  . DM (diabetes mellitus) (HCC)   . Elevated liver enzymes    hx of, normal 03/2010  . GERD (gastroesophageal reflux disease) 05/31/2011   History Schatzki's ring, erosive reflux esophagitis, status post last EGD and dilation 04/05/10   . Glaucoma    lens in both eyes  . Hemorrhoids 11/17/08   Colonoscopy Dr [name]  . Occipital neuralgia   . PTSD (post-traumatic stress disorder)   . Renal lithiasis   . 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 Torticollis   . Unspecified musculoskeletal disorders and symptoms referable to neck    cervical/trapezius   Past Surgical History:  Procedure Laterality Date  . BREAST LUMPECTOMY     left  . CATARACT EXTRACTION W/PHACO Left 10/07/2016   Procedure: CATARACT EXTRACTION PHACO AND INTRAOCULAR LENS PLACEMENT (IOC);  Surgeon: [place] Payor, MD;  Location: [facility];  Service: Ophthalmology;  Laterality: Left;  CDE: 4.98  . CATARACT EXTRACTION W/PHACO Right 10/21/2016   Procedure: CATARACT EXTRACTION PHACO AND INTRAOCULAR LENS PLACEMENT RIGHT EYE CDE - 6.24;  Surgeon: [place] Payor, MD;  Location:  [facility];  Service: Ophthalmology;  Laterality: Right;  right  . CHOLECYSTECTOMY    . COLONOSCOPY  11/17/08   ext hemorrhoids  . CRANIOTOMY     secondary TBI  . CYST EXCISION     left wrist  . ESOPHAGOGASTRODUODENOSCOPY  04/05/10   [name] (79F),erosive reflux esophagitis, small hiatal hernia,  . ESOPHAGOGASTRODUODENOSCOPY (EGD) WITH PROPOFOL N/A 08/30/2019   Procedure: ESOPHAGOGASTRODUODENOSCOPY (EGD) WITH PROPOFOL;  Surgeon:  Monica Dolin, MD;  Location: [facility];  Service: Endoscopy;  Laterality: N/A;  3:30pm - pt knows to arrive at 9:45  . EXTRACORPOREAL SHOCK WAVE LITHOTRIPSY Left 12/13/2019   Procedure: EXTRACORPOREAL SHOCK WAVE LITHOTRIPSY (ESWL);  Surgeon: [place] Aloe, MD;  Location: [facility];  Service: Urology;  Laterality: Left;  . FINGER SURGERY     removal cyst left pinky  . gastro empy study  03/03/10   normal  . HAND SURGERY    . MALONEY DILATION N/A 08/30/2019   Procedure: Venia Minks DILATION;  Surgeon: [place] Dolin, MD;  Location: [facility];  Service: Endoscopy;  Laterality: N/A;  . NECK SURGERY  1998  . NEPHROLITHOTOMY  05/13/2012   Procedure: NEPHROLITHOTOMY PERCUTANEOUS;  Surgeon: [place] Gallo, MD;  Location: [facility];  Service: Urology;  Laterality: Left;        Current Meds  Medication Sig  . atorvastatin (LIPITOR) 10 MG tablet Take 1 tablet (10 mg total) by mouth daily.  [name] glipiZIDE (GLUCOTROL) 5 MG tablet Take 1 tablet (5 mg total) by mouth daily before breakfast.  . magnesium oxide (MAG-OX) 400 (241.3 Mg) MG tablet Take 1 tablet (400 mg total) by mouth 2 (two) times daily.  . metFORMIN (GLUCOPHAGE) 500 MG tablet Take 2 tablets (1,000 mg total) by mouth 2 (two) times daily.  . methadone (DOLOPHINE) 10 MG tablet Take 2 tablets (20 mg total) by mouth 3 (three) times daily.  . nortriptyline (PAMELOR) 25 MG capsule Take 1 capsule (25 mg total) by mouth at bedtime.  [name] tiZANidine (ZANAFLEX) 2 MG tablet TAKE (1) TABLET EVERY SIX HOURS AS NEEDED FOR MUSCLE SPASMS. (Patient taking differently: Take 2 mg by mouth every 6 (six) hours as needed for muscle spasms. )  . topiramate (TOPAMAX) 50 MG tablet TAKE 2 TABLETS IN THE MORNING AND 4 TABLETS AT BEDTIME. (Patient taking differently: Take 100-200 mg by mouth See admin instructions. TAKE 2 TABLETS IN THE MORNING AND 4 TABLETS AT BEDTIME.)     Allergies:   Aspirin, Hydromorphone hcl, Vimpat [lacosamide], Capsaicin,  Depakote [divalproex sodium], Milnacipran hcl, Pristiq [desvenlafaxine succinate monohydrate], and Gabapentin   Social History   Tobacco Use  . Smoking status: Never Smoker  . Smokeless tobacco: Never Used  Substance Use Topics  . Alcohol use: No  . Drug use: No     Family Hx: The patient's family history includes Colon cancer (age of onset: 37) in her mother; Diabetes in her father and paternal grandmother; Diverticulitis in her daughter; Heart disease in her father and paternal grandfather.  ROS:   Please see the history of present illness.     All other systems reviewed and are negative.   Prior CV studies:   The following studies were reviewed today:  NST 11/12/19:   Blood pressure demonstrated a normal response to exercise.  There was no ST segment deviation noted during stress.  The study is normal.  This is a low risk study.  The left ventricular ejection fraction is normal (55-65%).   Normal resting  and stress perfusion. No ischemia or infarction EF 56%  Labs/Other Tests and Data Reviewed:    EKG:  I personally reviewed the ECG performed on 12/30/19 which showed a rapid irregular rhythm, 114 bpm, baseline artifact, possibly atrial fibrillation vs MAT  Recent Labs: 12/30/2019: ALT 13 01/03/2020: BUN 23; Creatinine, Ser 1.17; Hemoglobin 10.1; Magnesium 1.5; Platelets 107; Potassium 3.6; Sodium 135   Recent Lipid Panel Lab Results  Component Value Date/Time   CHOL 147 06/29/2019 03:24 PM   TRIG 174 (H) 06/29/2019 03:24 PM   HDL 36 (L) 06/29/2019 03:24 PM   CHOLHDL 4.1 06/29/2019 03:24 PM   LDLCALC 81 06/29/2019 03:24 PM    Wt Readings from Last 3 Encounters:  01/02/20 125 lb 7.1 oz (56.9 kg)  12/15/19 138 lb (62.6 kg)  12/13/19 130 lb (59 kg)     Objective:    Vital Signs:  There were no vitals taken for this visit.    ASSESSMENT & PLAN:    1.  Chest pain:   She also has a history of erosive reflux esophagitis.  Nuclear stress test normal on  11/12/2019, EF 56%.  No further ischemic testing is indicated at this time.  2.  Bilateral leg edema: She likely has a component of chronic venous insufficiency.  I may prescribe compression stockings in the future.    Previously ordered an echocardiogram but this has not been obtained as she had side effects from [place] and could not proceed that day. I will obtain one now given complaints of shortness of breath as well.  3.  Generalized weakness: This was likely due to acute renal failure which has since resolved. She is also scheduled to see neurology.  4.  Arrhythmia: She previously had sinus rhythm with sinus arrhythmia.  She denies palpitations.    She had a rapid irregular rhythm which was either multifocal atrial tachycardia versus atrial fibrillation by ECG on 12/30/2019.  This is in the context of multiple metabolic derangements.  I will not obtain a cardiac monitor at this time but we do so if she were to have palpitations.  I did encourage her husband purchase a blood pressure cuff which will monitor her heart rate as well.  5. Shortness of breath: I will obtain an echocardiogram to evaluate heart structure and function.     COVID-19 Education: The signs and symptoms of COVID-19 were discussed with the patient and how to seek care for testing (follow up with PCP or arrange E-visit).  The importance of social distancing was discussed today.  Time:   Today, I have spent 25 minutes with the patient with telehealth technology discussing the above problems.     Medication Adjustments/Labs and Tests Ordered: Current medicines are reviewed at length with the patient today.  Concerns regarding medicines are outlined above.   Tests Ordered: No orders of the defined types were placed in this encounter.   Medication Changes: No orders of the defined types were placed in this encounter.   Follow Up:  Virtual Visit  prn  Signed, [place] Docker, MD  01/06/2020 8:37 AM    [facility]

## 2020-01-06 NOTE — Addendum Note (Signed)
Addended by: Marlyn Corporal A on: 01/06/2020 09:05 AM   Modules accepted: Orders

## 2020-01-11 ENCOUNTER — Ambulatory Visit (INDEPENDENT_AMBULATORY_CARE_PROVIDER_SITE_OTHER): Payer: Medicare Other | Admitting: Family Medicine

## 2020-01-11 ENCOUNTER — Encounter: Payer: Self-pay | Admitting: Family Medicine

## 2020-01-11 ENCOUNTER — Other Ambulatory Visit: Payer: Self-pay

## 2020-01-11 VITALS — BP 112/63 | HR 106 | Temp 97.3°F | Ht 64.0 in | Wt 129.0 lb

## 2020-01-11 DIAGNOSIS — M79605 Pain in left leg: Secondary | ICD-10-CM

## 2020-01-11 DIAGNOSIS — N179 Acute kidney failure, unspecified: Secondary | ICD-10-CM

## 2020-01-11 DIAGNOSIS — D649 Anemia, unspecified: Secondary | ICD-10-CM

## 2020-01-11 DIAGNOSIS — L299 Pruritus, unspecified: Secondary | ICD-10-CM

## 2020-01-11 DIAGNOSIS — E1142 Type 2 diabetes mellitus with diabetic polyneuropathy: Secondary | ICD-10-CM

## 2020-01-11 DIAGNOSIS — G479 Sleep disorder, unspecified: Secondary | ICD-10-CM

## 2020-01-11 DIAGNOSIS — M79604 Pain in right leg: Secondary | ICD-10-CM

## 2020-01-11 MED ORDER — MAGNESIUM OXIDE 400 (241.3 MG) MG PO TABS: 400.0000 mg | ORAL_TABLET | Freq: Two times a day (BID) | ORAL | 2 refills | Status: DC

## 2020-01-11 NOTE — Progress Notes (Signed)
Assessment & Plan:  1. Acute renal failure, unspecified acute renal failure type ([place]) - Improved upon discharge. Labs today. - CBC with Differential/Platelet - BMP8+EGFR  2. Hypomagnesemia - Continue supplement. Labs today.  - Magnesium  3. Anemia, unspecified type - CBC with Differential/Platelet  4-5. Pain in both lower extremities/Diabetic polyneuropathy associated with type 2 diabetes mellitus ([place]) - Improving with Pamelor.   6. Difficulty sleeping - Improving with Pamelor.   7. Itching - Continue Benadryl cream. Discussed hydrocortisone cream if needed.    Follow up plan: Return as scheduled.  [name], MSN, APRN, FNP-C [facility]  Subjective:   Patient ID: [name], female    DOB: 11-30-1950, 69 y.o.   MRN: [id number]  HPI: [name] is a 69 y.o. female presenting on 01/11/2020 for Hospitalization Follow-up (12/30/19 [facility]- renal failure )  Patient was admitted to [facility] from 12/30/2019 - 01/03/2020 due to renal failure from dehydration and NSAID use.  Hemodialysis was performed and renal function improved close to baseline.  She was seen by PT and OT who recommended rehabilitation at the inpatient rehab center however patient declined to go and preferred to go home with home health. She reports the therapist has come out and performed their evaluation. They are supposed to start working with her this week. She was placed on magnesium which she ran out of yesterday.   Patient reports the pain in her legs has been a little better lately and she has been sleeping better since starting on the Pamelor.  Patient c/o itching to the left foot and bilateral hips. She has been taking Benadryl and applying Benadryl cream.     ROS: Negative unless specifically indicated above in HPI.   Relevant past medical history reviewed and updated as indicated.   Allergies and medications reviewed and updated.   Current Outpatient  Medications:  .  atorvastatin (LIPITOR) 10 MG tablet, Take 1 tablet (10 mg total) by mouth daily., Disp: 30 tablet, Rfl: 2 .  blood glucose meter kit and supplies KIT, Dispense based on patient and insurance preference. Use up to four times daily as directed. (FOR ICD-9 250.00, 250.01)., Disp: 1 each, Rfl: 0 .  glipiZIDE (GLUCOTROL) 5 MG tablet, Take 1 tablet (5 mg total) by mouth daily before breakfast., Disp: 30 tablet, Rfl: 3 .  magnesium oxide (MAG-OX) 400 (241.3 Mg) MG tablet, Take 1 tablet (400 mg total) by mouth 2 (two) times daily., Disp: 60 tablet, Rfl: 2 .  metFORMIN (GLUCOPHAGE) 500 MG tablet, Take 2 tablets (1,000 mg total) by mouth 2 (two) times daily., Disp: 360 tablet, Rfl: 1 .  methadone (DOLOPHINE) 10 MG tablet, Take 2 tablets (20 mg total) by mouth 3 (three) times daily., Disp: 180 tablet, Rfl: 0 .  nortriptyline (PAMELOR) 25 MG capsule, Take 1 capsule (25 mg total) by mouth at bedtime., Disp: 30 capsule, Rfl: 2 .  ondansetron (ZOFRAN) 4 MG tablet, Take 1 tablet (4 mg total) by mouth every 8 (eight) hours as needed for nausea or vomiting., Disp: 4 tablet, Rfl: 0 .  tapentadol (NUCYNTA) 50 MG tablet, Take 1 tablet (50 mg total) by mouth daily as needed (migraines)., Disp: 60 tablet, Rfl: 0 .  tiZANidine (ZANAFLEX) 2 MG tablet, TAKE (1) TABLET EVERY SIX HOURS AS NEEDED FOR MUSCLE SPASMS. (Patient taking differently: Take 2 mg by mouth every 6 (six) hours as needed for muscle spasms. ), Disp: 60 tablet, Rfl: 1 .  topiramate (TOPAMAX) 50  MG tablet, TAKE 2 TABLETS IN THE MORNING AND 4 TABLETS AT BEDTIME. (Patient taking differently: Take 100-200 mg by mouth See admin instructions. TAKE 2 TABLETS IN THE MORNING AND 4 TABLETS AT BEDTIME.), Disp: 180 tablet, Rfl: 0  Allergies  Allergen Reactions  . Aspirin Anaphylaxis and Hives    REACTION: slurred speech, blurred vision  . Hydromorphone Hcl Shortness Of Breath    severe  . Vimpat [Lacosamide] Other (See Comments)    Hallucinations, out  of body experience, confusion   . Capsaicin     Red rash  . Depakote [Divalproex Sodium]   . Milnacipran Hcl Other (See Comments)    dizziness  . Pristiq [Desvenlafaxine Succinate Monohydrate] Other (See Comments)    dizziness  . Gabapentin     States leg pain worsened, pt d/c'd    Objective:   BP 112/63   Pulse (!) 106   Temp (!) 97.3 F (36.3 C) (Temporal)   Ht '5\' 4"'$  (1.626 m)   Wt 129 lb (58.5 kg)   SpO2 99%   BMI 22.14 kg/m    Physical Exam Vitals reviewed.  Constitutional:      General: She is not in acute distress.    Appearance: Normal appearance. She is normal weight. She is not ill-appearing, toxic-appearing or diaphoretic.  HENT:     Head: Normocephalic and atraumatic.  Eyes:     General: No scleral icterus.       Right eye: No discharge.        Left eye: No discharge.     Conjunctiva/sclera: Conjunctivae normal.  Cardiovascular:     Rate and Rhythm: Normal rate and regular rhythm.     Heart sounds: Normal heart sounds. No murmur. No friction rub. No gallop.   Pulmonary:     Effort: Pulmonary effort is normal. No respiratory distress.     Breath sounds: Normal breath sounds. No stridor. No wheezing, rhonchi or rales.  Musculoskeletal:        General: Normal range of motion.     Cervical back: Normal range of motion.  Skin:    General: Skin is warm and dry.     Capillary Refill: Capillary refill takes less than 2 seconds.     Findings: Bruising (scattered to abdomen, chest, and BUE) present. No rash.  Neurological:     General: No focal deficit present.     Mental Status: She is alert and oriented to person, place, and time. Mental status is at baseline.     Gait: Gait abnormal (walking with walker).  Psychiatric:        Mood and Affect: Mood normal.        Behavior: Behavior normal.        Thought Content: Thought content normal.        Judgment: Judgment normal.

## 2020-01-13 ENCOUNTER — Telehealth: Payer: Self-pay | Admitting: Family Medicine

## 2020-01-13 LAB — CBC WITH DIFFERENTIAL/PLATELET
Basophils Absolute: 0.1 10*3/uL (ref 0.0–0.2)
Basos: 1 %
EOS (ABSOLUTE): 0.2 10*3/uL (ref 0.0–0.4)
Eos: 3 %
Hematocrit: 29.3 % — ABNORMAL LOW (ref 34.0–46.6)
Hemoglobin: 9.8 g/dL — ABNORMAL LOW (ref 11.1–15.9)
Immature Grans (Abs): 0 10*3/uL (ref 0.0–0.1)
Immature Granulocytes: 0 %
Lymphocytes Absolute: 1 10*3/uL (ref 0.7–3.1)
Lymphs: 14 %
MCH: 29.9 pg (ref 26.6–33.0)
MCHC: 33.4 g/dL (ref 31.5–35.7)
MCV: 89 fL (ref 79–97)
Monocytes Absolute: 0.4 10*3/uL (ref 0.1–0.9)
Monocytes: 6 %
Neutrophils Absolute: 5.5 10*3/uL (ref 1.4–7.0)
Neutrophils: 76 %
Platelets: 208 10*3/uL (ref 150–450)
RBC: 3.28 x10E6/uL — ABNORMAL LOW (ref 3.77–5.28)
RDW: 14.2 % (ref 11.7–15.4)
WBC: 7.2 10*3/uL (ref 3.4–10.8)

## 2020-01-13 LAB — BMP8+EGFR
BUN/Creatinine Ratio: 12 (ref 12–28)
BUN: 13 mg/dL (ref 8–27)
CO2: 21 mmol/L (ref 20–29)
Calcium: 9.4 mg/dL (ref 8.7–10.3)
Chloride: 101 mmol/L (ref 96–106)
Creatinine, Ser: 1.05 mg/dL — ABNORMAL HIGH (ref 0.57–1.00)
GFR calc Af Amer: 64 mL/min/{1.73_m2} (ref >59)
GFR calc non Af Amer: 55 mL/min/{1.73_m2} — ABNORMAL LOW (ref >59)
Glucose: 145 mg/dL — ABNORMAL HIGH (ref 65–99)
Potassium: 4.3 mmol/L (ref 3.5–5.2)
Sodium: 139 mmol/L (ref 134–144)

## 2020-01-13 LAB — ROCKY MTN SPOTTED FVR ABS PNL(IGG+IGM)
RMSF IgG: NEGATIVE
RMSF IgM: 0.65 index (ref 0.00–0.89)

## 2020-01-13 LAB — MAGNESIUM: Magnesium: 1.3 mg/dL — ABNORMAL LOW (ref 1.6–2.3)

## 2020-01-13 LAB — LYME AB/WESTERN BLOT REFLEX
LYME DISEASE AB, QUANT, IGM: <0.8 index (ref 0.00–0.79)
Lyme IgG/IgM Ab: <0.91 {ISR} (ref 0.00–0.90)

## 2020-01-13 NOTE — Telephone Encounter (Signed)
Patient aware of labs. Verbalized understanding 

## 2020-01-14 ENCOUNTER — Encounter: Payer: Self-pay | Admitting: Physical Medicine & Rehabilitation

## 2020-01-18 ENCOUNTER — Other Ambulatory Visit: Payer: Self-pay

## 2020-01-18 ENCOUNTER — Ambulatory Visit (INDEPENDENT_AMBULATORY_CARE_PROVIDER_SITE_OTHER): Payer: Medicare Other

## 2020-01-18 ENCOUNTER — Other Ambulatory Visit: Payer: Self-pay | Admitting: Physical Medicine & Rehabilitation

## 2020-01-18 DIAGNOSIS — K219 Gastro-esophageal reflux disease without esophagitis: Secondary | ICD-10-CM

## 2020-01-18 DIAGNOSIS — G43011 Migraine without aura, intractable, with status migrainosus: Secondary | ICD-10-CM

## 2020-01-18 DIAGNOSIS — M542 Cervicalgia: Secondary | ICD-10-CM

## 2020-01-18 DIAGNOSIS — F4310 Post-traumatic stress disorder, unspecified: Secondary | ICD-10-CM

## 2020-01-18 DIAGNOSIS — M47812 Spondylosis without myelopathy or radiculopathy, cervical region: Secondary | ICD-10-CM

## 2020-01-18 DIAGNOSIS — G4486 Cervicogenic headache: Secondary | ICD-10-CM

## 2020-01-18 DIAGNOSIS — Z9181 History of falling: Secondary | ICD-10-CM

## 2020-01-18 DIAGNOSIS — G8929 Other chronic pain: Secondary | ICD-10-CM | POA: Diagnosis not present

## 2020-01-18 DIAGNOSIS — M1990 Unspecified osteoarthritis, unspecified site: Secondary | ICD-10-CM

## 2020-01-18 DIAGNOSIS — N179 Acute kidney failure, unspecified: Secondary | ICD-10-CM

## 2020-01-18 DIAGNOSIS — M7918 Myalgia, other site: Secondary | ICD-10-CM

## 2020-01-18 DIAGNOSIS — G894 Chronic pain syndrome: Secondary | ICD-10-CM

## 2020-01-18 DIAGNOSIS — N200 Calculus of kidney: Secondary | ICD-10-CM

## 2020-01-18 DIAGNOSIS — Z79891 Long term (current) use of opiate analgesic: Secondary | ICD-10-CM

## 2020-01-18 DIAGNOSIS — E119 Type 2 diabetes mellitus without complications: Secondary | ICD-10-CM

## 2020-01-18 DIAGNOSIS — H409 Unspecified glaucoma: Secondary | ICD-10-CM

## 2020-01-18 DIAGNOSIS — Z79899 Other long term (current) drug therapy: Secondary | ICD-10-CM

## 2020-01-18 DIAGNOSIS — G9341 Metabolic encephalopathy: Secondary | ICD-10-CM

## 2020-01-18 DIAGNOSIS — Z7984 Long term (current) use of oral hypoglycemic drugs: Secondary | ICD-10-CM

## 2020-01-18 DIAGNOSIS — M5481 Occipital neuralgia: Secondary | ICD-10-CM

## 2020-01-18 DIAGNOSIS — G43711 Chronic migraine without aura, intractable, with status migrainosus: Secondary | ICD-10-CM

## 2020-01-18 DIAGNOSIS — Z5181 Encounter for therapeutic drug level monitoring: Secondary | ICD-10-CM

## 2020-01-20 ENCOUNTER — Other Ambulatory Visit: Payer: Self-pay

## 2020-01-20 ENCOUNTER — Ambulatory Visit (HOSPITAL_COMMUNITY)
Admission: RE | Admit: 2020-01-20 | Discharge: 2020-01-20 | Disposition: A | Discharge status: Home / Self Care | Payer: Medicare Other | Source: Ambulatory Visit | Attending: Cardiovascular Disease | Admitting: Cardiovascular Disease

## 2020-01-20 DIAGNOSIS — R0602 Shortness of breath: Secondary | ICD-10-CM

## 2020-01-20 NOTE — Progress Notes (Signed)
*  PRELIMINARY RESULTS* Echocardiogram 2D Echocardiogram has been performed.  Stacey Drain 01/20/2020, 2:47 PM

## 2020-01-21 ENCOUNTER — Telehealth: Payer: Self-pay | Admitting: *Deleted

## 2020-01-21 ENCOUNTER — Telehealth: Payer: Self-pay | Admitting: Physical Medicine & Rehabilitation

## 2020-01-21 DIAGNOSIS — G43711 Chronic migraine without aura, intractable, with status migrainosus: Secondary | ICD-10-CM

## 2020-01-21 DIAGNOSIS — G4486 Cervicogenic headache: Secondary | ICD-10-CM

## 2020-01-21 MED ORDER — METHADONE HCL 10 MG PO TABS: 20.0000 mg | ORAL_TABLET | Freq: Three times a day (TID) | ORAL | 0 refills | Status: DC

## 2020-01-21 MED ORDER — TAPENTADOL HCL 50 MG PO TABS: 50.0000 mg | ORAL_TABLET | Freq: Every day | ORAL | 0 refills | Status: DC | PRN

## 2020-01-21 NOTE — Telephone Encounter (Signed)
[name] has been notified

## 2020-01-21 NOTE — Telephone Encounter (Signed)
Med RF's sent in.

## 2020-01-21 NOTE — Telephone Encounter (Signed)
[name] is calling to let us know patient's pharmacy said they faxed in to get refills on medication.  Patient is out of all meds.  Please call [name] with any questions.

## 2020-01-21 NOTE — Telephone Encounter (Signed)
Monica [name] left a message distraught about her medication and not understanding why we have not refilled it and called her.  Her husband Monica [name] answered and I told him that Dr [name] filled the medications at 11:30 and there is a note from [place] saying Monica [name] was notified. He said he had not been notified. I said perhaps it is on his voicemail. I reviewed that the rx were confirmed received by the pharmacy at 11:31 am today.

## 2020-01-25 ENCOUNTER — Other Ambulatory Visit: Payer: Self-pay | Admitting: *Deleted

## 2020-01-25 MED ORDER — ATORVASTATIN CALCIUM 10 MG PO TABS: 10.0000 mg | ORAL_TABLET | Freq: Every day | ORAL | 2 refills | Status: DC

## 2020-02-02 ENCOUNTER — Ambulatory Visit (INDEPENDENT_AMBULATORY_CARE_PROVIDER_SITE_OTHER): Payer: Medicare Other | Admitting: Family Medicine

## 2020-02-02 ENCOUNTER — Other Ambulatory Visit: Payer: Self-pay

## 2020-02-02 ENCOUNTER — Encounter: Payer: Self-pay | Admitting: Family Medicine

## 2020-02-02 VITALS — BP 115/73 | HR 97 | Temp 97.1°F | Ht 64.0 in | Wt 127.4 lb

## 2020-02-02 DIAGNOSIS — M79605 Pain in left leg: Secondary | ICD-10-CM

## 2020-02-02 DIAGNOSIS — E1142 Type 2 diabetes mellitus with diabetic polyneuropathy: Secondary | ICD-10-CM

## 2020-02-02 DIAGNOSIS — M79604 Pain in right leg: Secondary | ICD-10-CM

## 2020-02-02 DIAGNOSIS — R29898 Other symptoms and signs involving the musculoskeletal system: Secondary | ICD-10-CM

## 2020-02-02 DIAGNOSIS — R296 Repeated falls: Secondary | ICD-10-CM

## 2020-02-02 MED ORDER — DAPAGLIFLOZIN PROPANEDIOL 10 MG PO TABS: 10.0000 mg | ORAL_TABLET | Freq: Every day | ORAL | 2 refills | Status: DC

## 2020-02-02 MED ORDER — METFORMIN HCL 500 MG PO TABS: 1000.0000 mg | ORAL_TABLET | Freq: Two times a day (BID) | ORAL | 1 refills | Status: DC

## 2020-02-02 NOTE — Patient Instructions (Signed)
  Nestle flavored water.

## 2020-02-02 NOTE — Progress Notes (Signed)
Assessment & Plan:  1. Type 2 diabetes mellitus with diabetic polyneuropathy, without long-term current use of insulin (HCC) Lab Results  Component Value Date   HGBA1C 9.4 (H) 12/31/2019   HGBA1C 9.9 (H) 10/22/2019   HGBA1C >14.0 (H) 06/29/2019  - Diabetes is not at goal of A1c < 7. - Medications: continue current medications, start Farxiga 10 mg QD - Home glucose monitoring: continue monitoring. Encouraged to check fasting. - Patient is not currently taking a statin. Patient is not taking an ACE-inhibitor/ARB.  - Last foot exam: 02/02/20 - Last diabetic eye exam: 04/06/2019 - Urine Microalbumin/Creat Ratio: 06/29/2019 - Instruction/counseling given: discussed diet - metFORMIN (GLUCOPHAGE) 500 MG tablet; Take 2 tablets (1,000 mg total) by mouth 2 (two) times daily.  Dispense: 360 tablet; Refill: 1 - dapagliflozin propanediol (FARXIGA) 10 MG TABS tablet; Take 1 tablet (10 mg total) by mouth daily before breakfast.  Dispense: 30 tablet; Refill: 2  2. Hypomagnesemia - Encouraged to continue supplement.   3-5. Pain in both lower extremities/Weakness of both lower extremities/Frequent falls - Patient has EMGs scheduled for Monday. Discussed the importance of MRI with patient and the possibility of sedation with anesthesia. I will message her neurologist as well.  - Continue PT and using a walker.    Return in about 3 months (around 05/04/2020) for follow-up of chronic medication conditions.  [name], MSN, APRN, FNP-C [facility]  Subjective:    Patient ID: Monica [name], female    DOB: [birth year], 69 y.o.   MRN: [id number]  Patient Care Team: Loman Brooklyn, FNP as PCP - General (Family Medicine) Herminio Commons, MD as PCP - Cardiology (Cardiology) Daneil Dolin, MD (Gastroenterology) Tyson Dense, MD as Consulting Physician (Obstetrics and Gynecology) Meredith Staggers, MD as Consulting Physician (Physical Medicine and  Rehabilitation)   Chief Complaint:  Chief Complaint  Patient presents with  . Diabetes    3 month follow up of chronic medical conditions  . Labs Only    magnesium checked to see if she needs to stay on otc    HPI: Monica [name] is a 69 y.o. female presenting on 02/02/2020 for Diabetes (3 month follow up of chronic medical conditions) and Labs Only (magnesium checked to see if she needs to stay on otc)  Diabetes: Patient presents for follow up of diabetes. Current symptoms include: hyperglycemia. Symptoms have gradually improved. Medication compliance: yes. Current diet: in general, an "unhealthy" diet. She is only eating breakfast and dinner. She admits to eating ice cream and mostly drinking orange juice. Sometimes she drinks vanilla Coke. Current exercise: none. Home blood sugar records: >200 around 3 PM daily. Is she  on ACE inhibitor or angiotensin II receptor blocker? No. Is she on a statin? No.   Lab Results  Component Value Date   HGBA1C 9.4 (H) 12/31/2019   HGBA1C 9.9 (H) 10/22/2019   HGBA1C >14.0 (H) 06/29/2019   Lab Results  Component Value Date   LDLCALC 81 06/29/2019   CREATININE 1.05 (H) 01/11/2020    New complaints: Patient has been getting therapy twice weekly since she was discharged from the hospital. She does report she is getting some of her strength back but does not feel it has helped her frequency of falling due to the issues with her legs. She states the falls are hurting more and more and it is getting hard to keep going because of it. She is currently using a walker and is fearful of  progressing to the point of requiring a wheelchair. Her legs continue with weakness, tingling, burning, and pain. Sometimes she has to physically pick up her RLE to move it. She quit taking atorvastatin a week ago to see if this would help with her legs. She has EMGs scheduled for 02/07/20 with her neurologist.    Social history:  Relevant past medical, surgical, family and  social history reviewed and updated as indicated. Interim medical history since our last visit reviewed.  Allergies and medications reviewed and updated.  DATA REVIEWED: CHART IN EPIC  ROS: Negative unless specifically indicated above in HPI.    Current Outpatient Medications:  .  blood glucose meter kit and supplies KIT, Dispense based on patient and insurance preference. Use up to four times daily as directed. (FOR ICD-9 250.00, 250.01)., Disp: 1 each, Rfl: 0 .  glipiZIDE (GLUCOTROL) 5 MG tablet, Take 1 tablet (5 mg total) by mouth daily before breakfast., Disp: 30 tablet, Rfl: 3 .  magnesium oxide (MAG-OX) 400 (241.3 Mg) MG tablet, Take 1 tablet (400 mg total) by mouth 2 (two) times daily., Disp: 60 tablet, Rfl: 2 .  metFORMIN (GLUCOPHAGE) 500 MG tablet, Take 2 tablets (1,000 mg total) by mouth 2 (two) times daily., Disp: 360 tablet, Rfl: 1 .  methadone (DOLOPHINE) 10 MG tablet, Take 2 tablets (20 mg total) by mouth 3 (three) times daily., Disp: 180 tablet, Rfl: 0 .  nortriptyline (PAMELOR) 25 MG capsule, Take 1 capsule (25 mg total) by mouth at bedtime., Disp: 30 capsule, Rfl: 2 .  ondansetron (ZOFRAN) 4 MG tablet, Take 1 tablet (4 mg total) by mouth every 8 (eight) hours as needed for nausea or vomiting., Disp: 4 tablet, Rfl: 0 .  tapentadol (NUCYNTA) 50 MG tablet, Take 1 tablet (50 mg total) by mouth daily as needed (migraines)., Disp: 60 tablet, Rfl: 0 .  tiZANidine (ZANAFLEX) 2 MG tablet, TAKE (1) TABLET EVERY SIX HOURS AS NEEDED FOR MUSCLE SPASMS. (Patient taking differently: Take 2 mg by mouth every 6 (six) hours as needed for muscle spasms. ), Disp: 60 tablet, Rfl: 1 .  topiramate (TOPAMAX) 50 MG tablet, TAKE 2 TABLETS IN THE MORNING AND 4 TABLETS AT BEDTIME., Disp: 180 tablet, Rfl: 0 .  atorvastatin (LIPITOR) 10 MG tablet, Take 1 tablet (10 mg total) by mouth daily. (Patient not taking: Reported on 02/02/2020), Disp: 30 tablet, Rfl: 2   Allergies  Allergen Reactions  . Aspirin  Anaphylaxis and Hives    REACTION: slurred speech, blurred vision  . Hydromorphone Hcl Shortness Of Breath    severe  . Vimpat [Lacosamide] Other (See Comments)    Hallucinations, out of body experience, confusion   . Capsaicin     Red rash  . Depakote [Divalproex Sodium]   . Milnacipran Hcl Other (See Comments)    dizziness  . Pristiq [Desvenlafaxine Succinate Monohydrate] Other (See Comments)    dizziness  . Gabapentin     States leg pain worsened, pt d/c'd   Past Medical History:  Diagnosis Date  . Arthritis   . Cervical facet joint syndrome 04/22/2012  . Cervicalgia   . Chronic headaches    Dr Tessa Lerner [place] [facility]  . Chronic nausea    normal GES   . Complication of anesthesia    had problem with local anesthesia-tried to assist Physician  . DM (diabetes mellitus) ([place])   . Elevated liver enzymes    hx of, normal 03/2010  . GERD (gastroesophageal reflux disease) 05/31/2011   History  Schatzki's ring, erosive reflux esophagitis, status post last EGD and dilation 04/05/10   . Glaucoma    lens in both eyes  . Hemorrhoids 11/17/08   Colonoscopy Dr Laural Golden  . Occipital neuralgia   . PTSD (post-traumatic stress disorder)   . Renal lithiasis   . Seizure ([place])    12 yrs ago-med related  . Torticollis   . Unspecified musculoskeletal disorders and symptoms referable to neck    cervical/trapezius    Past Surgical History:  Procedure Laterality Date  . BREAST LUMPECTOMY     left  . CATARACT EXTRACTION W/PHACO Left 10/07/2016   Procedure: CATARACT EXTRACTION PHACO AND INTRAOCULAR LENS PLACEMENT (IOC);  Surgeon: Tonny Branch, MD;  Location: [facility];  Service: Ophthalmology;  Laterality: Left;  CDE: 4.98  . CATARACT EXTRACTION W/PHACO Right 10/21/2016   Procedure: CATARACT EXTRACTION PHACO AND INTRAOCULAR LENS PLACEMENT RIGHT EYE CDE - 6.24;  Surgeon: Tonny Branch, MD;  Location: [facility];  Service: Ophthalmology;  Laterality: Right;  right  . CHOLECYSTECTOMY    . COLONOSCOPY   11/17/08   ext hemorrhoids  . CRANIOTOMY     secondary TBI  . CYST EXCISION     left wrist  . ESOPHAGOGASTRODUODENOSCOPY  04/05/10   [name] (25F),erosive reflux esophagitis, small hiatal hernia,  . ESOPHAGOGASTRODUODENOSCOPY (EGD) WITH PROPOFOL N/A 08/30/2019   Procedure: ESOPHAGOGASTRODUODENOSCOPY (EGD) WITH PROPOFOL;  Surgeon: Daneil Dolin, MD;  Location: [facility];  Service: Endoscopy;  Laterality: N/A;  3:30pm - pt knows to arrive at 9:45  . EXTRACORPOREAL SHOCK WAVE LITHOTRIPSY Left 12/13/2019   Procedure: EXTRACORPOREAL SHOCK WAVE LITHOTRIPSY (ESWL);  Surgeon: Festus Aloe, MD;  Location: [facility];  Service: Urology;  Laterality: Left;  . FINGER SURGERY     removal cyst left pinky  . gastro empy study  03/03/10   normal  . HAND SURGERY    . MALONEY DILATION N/A 08/30/2019   Procedure: Venia Minks DILATION;  Surgeon: Daneil Dolin, MD;  Location: [facility];  Service: Endoscopy;  Laterality: N/A;  . NECK SURGERY  1998  . NEPHROLITHOTOMY  05/13/2012   Procedure: NEPHROLITHOTOMY PERCUTANEOUS;  Surgeon: Franchot Gallo, MD;  Location: [facility];  Service: Urology;  Laterality: Left;       Social History   Socioeconomic History  . Marital status: Married    Spouse name: Not on file  . Number of children: 3  . Years of education: Not on file  . Highest education level: Not on file  Occupational History  . Occupation: disabled    Fish farm manager: UNEMPLOYED  Tobacco Use  . Smoking status: Never Smoker  . Smokeless tobacco: Never Used  Vaping Use  . Vaping Use: Never used  Substance and Sexual Activity  . Alcohol use: No  . Drug use: No  . Sexual activity: Yes    Birth control/protection: Post-menopausal  Other Topics Concern  . Not on file  Social History Narrative   Lives at home with husband    Right handed   Caffeine: none   Social Determinants of Health   Financial Resource Strain:   . Difficulty of Paying Living Expenses:   Food  Insecurity:   . Worried About Charity fundraiser in the Last Year:   . Arboriculturist in the Last Year:   Transportation Needs:   . Film/video editor (Medical):   Marland Kitchen Lack of Transportation (Non-Medical):   Physical Activity:   . Days of Exercise per Week:   .  Minutes of Exercise per Session:   Stress:   . Feeling of Stress :   Social Connections:   . Frequency of Communication with Friends and Family:   . Frequency of Social Gatherings with Friends and Family:   . Attends Religious Services:   . Active Member of Clubs or Organizations:   . Attends Archivist Meetings:   Marland Kitchen Marital Status:   Intimate Partner Violence:   . Fear of Current or Ex-Partner:   . Emotionally Abused:   Marland Kitchen Physically Abused:   . Sexually Abused:         Objective:    BP 115/73   Pulse 97   Temp (!) 97.1 F (36.2 C) (Temporal)   Ht 5' 4" (1.626 m)   Wt 127 lb 6.4 oz (57.8 kg)   SpO2 97%   BMI 21.87 kg/m   Wt Readings from Last 3 Encounters:  02/02/20 127 lb 6.4 oz (57.8 kg)  01/11/20 129 lb (58.5 kg)  01/02/20 125 lb 7.1 oz (56.9 kg)    Physical Exam Vitals reviewed.  Constitutional:      General: She is not in acute distress.    Appearance: Normal appearance. She is normal weight. She is not ill-appearing, toxic-appearing or diaphoretic.  HENT:     Head: Normocephalic and atraumatic.  Eyes:     General: No scleral icterus.       Right eye: No discharge.        Left eye: No discharge.     Conjunctiva/sclera: Conjunctivae normal.  Cardiovascular:     Rate and Rhythm: Normal rate and regular rhythm.     Heart sounds: Normal heart sounds. No murmur heard.  No friction rub. No gallop.   Pulmonary:     Effort: Pulmonary effort is normal. No respiratory distress.     Breath sounds: Normal breath sounds. No stridor. No wheezing, rhonchi or rales.  Musculoskeletal:        General: Normal range of motion.     Cervical back: Normal range of motion.     Comments: Had to pick  up RLE with her hands for the foot exam. Unable to lift leg otherwise.   Skin:    General: Skin is warm and dry.     Capillary Refill: Capillary refill takes less than 2 seconds.  Neurological:     General: No focal deficit present.     Mental Status: She is alert and oriented to person, place, and time. Mental status is at baseline.     Gait: Gait abnormal (ambulating with walker).  Psychiatric:        Mood and Affect: Mood normal.        Behavior: Behavior normal.        Thought Content: Thought content normal.        Judgment: Judgment normal.    Diabetic Foot Exam - Simple   Simple Foot Form Diabetic Foot exam was performed with the following findings: Yes 02/02/2020 10:30 AM  Visual Inspection No deformities, no ulcerations, no other skin breakdown bilaterally: Yes Sensation Testing Intact to touch and monofilament testing bilaterally: Yes Pulse Check Posterior Tibialis and Dorsalis pulse intact bilaterally: Yes Comments     No results found for: TSH Lab Results  Component Value Date   WBC 7.2 01/11/2020   HGB 9.8 (L) 01/11/2020   HCT 29.3 (L) 01/11/2020   MCV 89 01/11/2020   PLT 208 01/11/2020   Lab Results  Component Value Date   NA  139 01/11/2020   K 4.3 01/11/2020   CO2 21 01/11/2020   GLUCOSE 145 (H) 01/11/2020   BUN 13 01/11/2020   CREATININE 1.05 (H) 01/11/2020   BILITOT 1.7 (H) 12/30/2019   ALKPHOS 94 12/30/2019   AST 6 (L) 12/30/2019   ALT 13 12/30/2019   PROT 7.1 12/30/2019   ALBUMIN 3.1 (L) 12/31/2019   CALCIUM 9.4 01/11/2020   ANIONGAP 12 01/03/2020   Lab Results  Component Value Date   CHOL 147 06/29/2019   Lab Results  Component Value Date   HDL 36 (L) 06/29/2019   Lab Results  Component Value Date   LDLCALC 81 06/29/2019   Lab Results  Component Value Date   TRIG 174 (H) 06/29/2019   Lab Results  Component Value Date   CHOLHDL 4.1 06/29/2019   Lab Results  Component Value Date   HGBA1C 9.4 (H) 12/31/2019

## 2020-02-05 ENCOUNTER — Encounter: Payer: Self-pay | Admitting: Family Medicine

## 2020-02-07 ENCOUNTER — Ambulatory Visit (INDEPENDENT_AMBULATORY_CARE_PROVIDER_SITE_OTHER): Payer: Medicare Other | Admitting: Neurology

## 2020-02-07 DIAGNOSIS — M5416 Radiculopathy, lumbar region: Secondary | ICD-10-CM

## 2020-02-07 DIAGNOSIS — G6181 Chronic inflammatory demyelinating polyneuritis: Secondary | ICD-10-CM | POA: Diagnosis not present

## 2020-02-07 DIAGNOSIS — M62561 Muscle wasting and atrophy, not elsewhere classified, right lower leg: Secondary | ICD-10-CM

## 2020-02-07 DIAGNOSIS — R29898 Other symptoms and signs involving the musculoskeletal system: Secondary | ICD-10-CM | POA: Diagnosis not present

## 2020-02-07 DIAGNOSIS — W19XXXA Unspecified fall, initial encounter: Secondary | ICD-10-CM

## 2020-02-07 DIAGNOSIS — Z0289 Encounter for other administrative examinations: Secondary | ICD-10-CM

## 2020-02-07 DIAGNOSIS — G610 Guillain-Barre syndrome: Secondary | ICD-10-CM

## 2020-02-07 NOTE — Patient Instructions (Signed)
Referral to Dr. Venetia Maxon surgeon CT Myelogram   Myelogram  A myelogram is an imaging test. This test checks for problems in the spinal cord and the places where nerves attach to the spinal cord (nerve roots). A dye (contrast material) is put into your spine before the X-ray. This provides a clearer image for your doctor to see. You may need this test if you have a spinal cord problem that cannot be diagnosed with other imaging tests. You may also have this test to check your spine after surgery. Tell a doctor about:  Any allergies you have, especially to iodine.  All medicines you are taking, including vitamins, herbs, eye drops, creams, and over-the-counter medicines.  Any problems you or family members have had with anesthetic medicines or dye.  Any blood disorders you have.  Any surgeries you have had.  Any medical conditions you have or have had, including asthma.  Whether you are pregnant or may be pregnant. What are the risks? Generally, this is a safe procedure. However, problems may occur, including:  Infection.  Bleeding.  Allergic reaction to medicines or dyes.  Damage to your spinal cord or nerves.  Leaking of spinal fluid. This can cause a headache.  Damage to kidneys.  Seizures. This is rare. What happens before the procedure?  Follow instructions from your doctor about what you cannot eat or drink. You may be asked to drink more fluids.  Ask your doctor about changing or stopping your normal medicines. This is important if you take diabetes medicines or blood thinners.  Plan to have someone take you home from the hospital or clinic.  If you will be going home right after the procedure, plan to have someone with you for 24 hours. What happens during the procedure?  You will lie face down on a table.  Your doctor will find the best injection site on your spine. This is most often in the lower back.  This area will be washed with soap.  You will be  given a medicine to numb the area (local anesthetic).  Your doctor will place a long needle into the space around your spinal cord.  A sample of spinal fluid may be taken. This may be sent to the lab for testing.  The dye will be injected into the space around your spinal cord.  The exam table may be tilted. This helps the dye flow up or down your spine.  The X-ray will take images of your spinal cord.  A bandage (dressing) may be placed over the area where the dye was injected. The procedure may vary among doctors and hospitals. What can I expect after the procedure?  You may be monitored until you leave the hospital or clinic. This includes checking your blood pressure, heart rate, breathing rate, and blood oxygen level.  You may feel sore at the injection site. You may have a mild headache.  You will be told to lie flat with your head raised (elevated). This lowers the risk of a headache.  It is up to you to get the results of your procedure. Ask your doctor, or the department that is doing the procedure, when your results will be ready. Follow these instructions at home:   Rest as told by your doctor. Lie flat with your head slightly elevated.  Do not bend, lift, or do hard work for 24-48 hours, or as told by your doctor.  Take over-the-counter and prescription medicines only as told by your doctor.  Take care of your bandage as told by your doctor.  Drink enough fluid to keep your pee (urine) pale yellow.  Bathe or shower as told by your doctor. Contact a doctor if:  You have a fever.  You have a headache that lasts longer than 24 hours.  You feel sick to your stomach (nauseous).  You vomit.  Your neck is stiff.  Your legs feel numb.  You cannot pee.  You cannot poop (no bowel movement).  You have a rash.  You are itchy or sneezing. Get help right away if:  You have new symptoms or your symptoms get worse.  You have a seizure.  You have trouble  breathing. Summary  A myelogram is an imaging test that checks for problems in the spinal cord and the places where nerves attach to the spinal cord (nerve roots).  Before the procedure, follow instructions from your doctor. You will be told what not to eat or drink, or what medicines to change or stop.  After the procedure, you will be told to lie flat with your head raised (elevated). This will lower your risk of a headache.  Do not bend, lift, or do any hard work for 24-48 hours, or as told by your doctor.  Contact a doctor if you have a stiff neck or numb legs. Get help right away if your symptoms get worse, or you have a seizure or trouble breathing. This information is not intended to replace advice given to you by your health care provider. Make sure you discuss any questions you have with your health care provider. Document Revised: 10/07/2018 Document Reviewed: 10/08/2018 Elsevier Patient Education  [employer].

## 2020-02-07 NOTE — Progress Notes (Addendum)
Patient is here today as a new request to be seen by Dr. Hermelinda Medicus for severe back pain and lower extremity weakness.  She is here with her husband who also provides much information.  She says her legs started hurting in January. Prior she started having hip pain on the right in November of 2020 and the hip injections didn't help. Started getting worse in January. The pain shoots down from the hip into the right leg. "Everything hurts". Also her knees are burning. The pain has subsided in the last few weeks per husband (husband provides information) but patient denies this and says her right leg is still severe with pain and weakness, mostly the right leg .  There is the right leg weakness or it just doesn't respond to movement and it buckles and she falls, she has fallen 13 times since January. Arms are not involved, symptoms are primarily the right leg.  She denies any significant symptoms in the left leg.  She does say that she has some numbness in the left leg but that the right leg is her main problem.  Patient reports that her pain starts in her right low back and hip and radiates to the anterolateral right thigh, she also has pain that starts in the right low back that radiates down the lateral leg and lateral lower leg and wraps to the top of the foot.  The pain is definitely originating in the back and shooting down the leg.  Focused exam today showed diffuse weakness in the legs however patient is significantly in pain with poor effort, absent AJ's, trace reflexes in the bilateral patellar's.  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  She also has concomitant sensorimotor polyneuropathy and left L5 radiculopathy.  I reviewed MRI of the lumbar spine which did show  multilevel foraminal and lateral recess stenosis however mostly on the left side.  I do think given the emg findings we should proceed with a CT myelogram. There were 2 motor velocities in the demyelinating range however there appeared to be secondary to axonal degeneration, and all her F waves are normal making demyelinating disease much less likely however cannot rule out other disorders such as Polyradiculoneuropathy, Recommend CT myelogram, lumbar puncture and referral to neurosurgery.when we order CT myelogram we will ask for basic CSF labs as well hopefully can be done together.  Orders Placed This Encounter  Procedures  . DG Myelogram Lumbar  . CT LUMBAR SPINE W WO CONTRAST  . DG FLUORO GUIDED LOC OF NEEDLE/CATH TIP FOR SPINAL INJECT LT  . Ambulatory referral to Neurosurgery  . NCV with EMG(electromyography)   I spent 60 minutes of face-to-face and non-face-to-face time with patient on the  1. Bilateral lumbar radiculopathy   2. Weakness of both lower extremities   3. Atrophy of muscle of right lower leg   4. Fall, initial encounter   5. Polyradiculoneuropathy (HCC)    diagnosis.  This included previsit chart review, lab review, study review, order entry, electronic health record documentation, patient education on the different diagnostic and therapeutic options, counseling and coordination of care, risks and benefits of management, compliance, or risk factor reduction. This does not include time spent on emg/ncs.  Cc: Dr. Venetia Maxon, [name], Deliah Boston FNP

## 2020-02-07 NOTE — Progress Notes (Addendum)
Full Name: Monica [name] Gender: Female MRN #: [id number] Date of Birth: 02/02/1951    Visit Date: 02/07/2020 13:54 Age: 69 Years Examining Physician: Naomie Dean, MD  Referring Physician: [name] Height: 5 feet 4 inch  History: 69 year old patient here with severe back pain, radiating pain into the legs, leg weakness.  Symptoms started in January, the pain shoots down from her low back and hip into the right leg down to the foot.  She denies any significant symptoms in the left leg.  Her arms are also not involved.  Pain on the right leg starts in the low back radiates to the latero-medial thigh and also radiates from the low back to the lateral thigh to the lateral lower leg and wraps to the top of the foot.  Summary: Technically difficult exam due to pain; patient in significant pain, pleasant but having difficulty enduring the testing.  The right median motor nerve showed delayed distal onset latency (4.8 ms, normal less than 4.4).  The right ulnar ADM motor nerve showed delayed distal onset latency (3.7 ms, normal less than 3.3 ms).  The right peroneal motor nerve showed reduced amplitude (0.7 mV, normal greater than 2) and decreased conduction velocity (fibular head to ankle, 27 m/s, normal greater than 44) and decreased conduction velocity (popliteal fossa to fibular head, 24 m/s, normal greater than 44).  The right tibial motor nerve showed reduced amplitude (3.2 mV, normal greater than 4) and decreased conduction velocity (26 m/s, normal greater than 41).  The right radial sensory nerve showed delayed distal peak latency (3.2 ms, normal less than 2.9) and reduced amplitude (5 V, normal greater than 15).  The right sural sensory nerve and the right superficial peroneal sensory nerve both showed no response.  The right median sensory nerve showed delayed distal peak latency (3.9 ms, normal less than 3.4).  The right ulnar sensory nerve showed delayed distal peak latency (3.4 ms,  normal less than 3.1).  F waves were within normal limits.All remaining nerves (as indicated in the following tables) were within normal limits.  The right iliopsoas showed increased spontaneous activity and diminished motor unit recruitment.  The right vastus medialis showed increased spontaneous activity, polyphasic motor units and diminished motor unit recruitment.  The right tibialis anterior showed increased spontaneous activity and diminished motor unit recruitment.  The right extensor hallucis longus showed increased spontaneous activity and diminished motor unit recruitment.  The left tibialis anterior showed increased spontaneous activity and diminished motor unit recruitment.  The left biceps femoris showed increased spontaneous activity.  The right biceps femoris showed decreased insertional activity.  The left extensor hallucis longus showed increased spontaneous activity and diminished motor unit recruitment.  Could not perform EMG needle testing on the paraspinal muscles as these are not reliable after surgery.     Conclusion: 1.  There is electrophysiologic evidence of right acute/ongoing L3 radiculopathy and right acute/ongoing L5 radiculopathy. 2. There is also electrophysiologic evidence for left acute/ongoing L5 radiculopathy. 3. 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 4. Cannot rule out other disorders such as Polyradiculoneuropathy, Recommend CT myelogram, lumbar puncture and referral to neurosurgery.  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  [facility] [address] [place], [place] [address] Tel: [phone] Fax: [phone]  Verbal informed consent was obtained from the patient, patient was informed of potential risk of procedure, including bruising, bleeding, hematoma formation, infection,  muscle weakness, muscle pain, numbness,  among others.  Cc: Dr. Venetia Maxon,  [name], Deliah Boston FNP       [facility]    Nerve / Sites Muscle Latency Ref. Amplitude Ref. Rel Amp Segments Distance Velocity Ref. Area    ms ms mV mV %  cm m/s m/s mVms  R Median - APB     Wrist APB 4.8 ?4.4 5.0 ?4.0 100 Wrist - APB 7   17.0     Upper arm APB 9.0  4.4  87.4 Upper arm - Wrist 21 50 ?49 16.3  R Ulnar - ADM     Wrist ADM 3.7 ?3.3 11.0 ?6.0 100 Wrist - ADM 7   26.0     B.Elbow ADM 7.7  8.0  72.5 B.Elbow - Wrist 20 50 ?49 21.5     A.Elbow ADM 9.7  7.5  93.4 A.Elbow - B.Elbow 10 49 ?49 21.1         A.Elbow - Wrist      R Peroneal - EDB     Ankle EDB 5.7 ?6.5 0.7 ?2.0 100 Ankle - EDB 9   2.4     Fib head EDB 15.8  0.5  64.3 Fib head - Ankle 27 27 ?44 1.8     Pop fossa EDB 19.9  0.3  72.3 Pop fossa - Fib head 10 24 ?44 0.9         Pop fossa - Ankle      R Tibial - AH     Ankle AH 4.9 ?5.8 3.2 ?4.0 100 Ankle - AH 9   9.4     Pop fossa AH 19.0  2.2  69.7 Pop fossa - Ankle 37 26 ?41 7.8             SNC    Nerve / Sites Rec. Site Peak Lat Ref.  Amp Ref. Segments Distance Peak Diff Ref.    ms ms V V  cm ms ms  R Radial - Anatomical snuff box (Forearm)     Forearm Wrist 3.2 ?2.9 5 ?15 Forearm - Wrist 10    R Sural - Ankle (Calf)     Calf Ankle NR ?4.4 NR ?6 Calf - Ankle 14    R Superficial peroneal - Ankle     Lat leg Ankle NR ?4.4 NR ?6 Lat leg - Ankle 14    R Median, Ulnar - Transcarpal comparison     Median Palm Wrist 2.7 ?2.2 55 ?35 Median Palm - Wrist 8       Ulnar Palm Wrist 2.6 ?2.2 37 ?12 Ulnar Palm - Wrist 8          Median Palm - Ulnar Palm  0.2 ?0.4  R Median - Orthodromic (Dig II, Mid palm)     Dig II Wrist 3.9 ?3.4 11 ?10 Dig II - Wrist 13    R Ulnar - Orthodromic, (Dig V, Mid palm)     Dig V Wrist 3.4 ?3.1 9 ?5 Dig V - Wrist 31                   F  Wave    Nerve F Lat Ref.   ms ms  R Peroneal - EDB 45.0 ?56.0  R Tibial - AH 46.5 ?56.0  R Ulnar - ADM 31.7 ?32.0           EMG Summary Table    Spontaneous MUAP  Recruitment  Muscle IA Fib PSW Fasc Other Amp  Dur. Poly Pattern  R. Iliopsoas Normal None 3+ None _______ Normal Normal Normal Reduced  R. Vastus medialis Normal None 3+ None _______ Normal Increased 2+ Reduced  R. Tibialis anterior Normal None 3+ None _______ Normal Normal Normal Reduced  R. Gastrocnemius (Medial head) Normal None None None _______ Normal Normal Normal Normal  R. Extensor hallucis longus Normal None 2+ None _______ Normal Normal Normal Reduced  L. Iliopsoas Normal None None None _______ Normal Normal Normal Normal  L. Vastus medialis Normal None None None _______ Normal Normal Normal Normal  L. Tibialis anterior Normal None 2+ None _______ Normal Normal Normal Reduced  L. Gastrocnemius (Medial head) Normal None None None _______ Normal Normal Normal Normal  R. Gluteus maximus Normal None None None _______ Normal Normal Normal Normal  R. Gluteus medius Normal None None None _______ Normal Normal Normal Normal  L. Biceps femoris (short head) Normal None 3+ None _______ Normal Normal Normal Normal  R. Biceps femoris (short head) Decreased None None None _______ Normal Normal Normal Normal  R. Deltoid Normal None None None _______ Normal Normal Normal Normal  R. Triceps brachii Normal None None None _______ Normal Normal Normal Normal  R. Pronator teres Normal None None None _______ Normal Normal Normal Normal  R. First dorsal interosseous Normal None None None _______ Normal Normal Normal Normal  R. Biceps brachii Normal None None None _______ Normal Normal Normal Normal  R. Opponens pollicis Normal None None None _______ Normal Normal Normal Normal  L. Extensor hallucis longus Normal None 2+ None _______ Normal Normal Normal Reduced  R. Thoracic paraspinals (mid) Normal None None None _______ Normal Normal Normal Normal

## 2020-02-07 NOTE — Progress Notes (Signed)
See procedure note.

## 2020-02-08 ENCOUNTER — Telehealth: Payer: Self-pay | Admitting: Neurology

## 2020-02-08 NOTE — Telephone Encounter (Signed)
Spoke with husband [name] and patient. She still feels like she's being shocked in the leg. She reported it had calmed down and was better this afternoon then flared back up. She said it vibrates and is painful from groin to outside of thigh then travels down leg and wraps around ankle.   I spoke with Dr. Lucia Gaskins. She had discussed the leg pain yesterday and feels pt may have an L3 radic. Plan was to get a CT myelogram asap, likely refer to neurosurgery. I spoke with pt's husband [name] again and let him know Dr. Lucia Gaskins aware and discussed yesterday and feels this may be coming from pt's back. Plan for CT myelogram asap. Advised pt rest. He verbalized appreciation.

## 2020-02-08 NOTE — Telephone Encounter (Signed)
Patient states that she is still have vibration double on top of what she was already having its in her groin area . Vibration is not  Stopping . Patient has been doing other things around the house and its not going away what can she do please call. [phone] Please call Husband [name]

## 2020-02-08 NOTE — Procedures (Signed)
Full Name: Monica [name] Gender: Female MRN #: [id number] Date of Birth: Jun 24, 1951    Visit Date: 02/07/2020 13:54 Age: 69 Years Examining Physician: Naomie Dean, MD  Referring Physician: Dr. Hermelinda Medicus Height: 5 feet 4 inch  History: 69 year old patient here with severe back pain, radiating pain into the legs, leg weakness.  Symptoms started in January, the pain shoots down from her low back and hip into the right leg down to the foot.  She denies any significant symptoms in the left leg.  Her arms are also not involved.  Pain on the right leg starts in the low back radiates to the latero-medial thigh and also radiates from the low back to the lateral thigh to the lateral lower leg and wraps to the top of the foot.  Summary: Technically difficult exam due to pain; patient in significant pain, pleasant but having difficulty enduring the testing.  The right median motor nerve showed delayed distal onset latency (4.8 ms, normal less than 4.4).  The right ulnar ADM motor nerve showed delayed distal onset latency (3.7 ms, normal less than 3.3 ms).  The right peroneal motor nerve showed reduced amplitude (0.7 mV, normal greater than 2) and decreased conduction velocity (fibular head to ankle, 27 m/s, normal greater than 44) and decreased conduction velocity (popliteal fossa to fibular head, 24 m/s, normal greater than 44).  The right tibial motor nerve showed reduced amplitude (3.2 mV, normal greater than 4) and decreased conduction velocity (26 m/s, normal greater than 41).  The right radial sensory nerve showed delayed distal peak latency (3.2 ms, normal less than 2.9) and reduced amplitude (5 V, normal greater than 15).  The right sural sensory nerve and the right superficial peroneal sensory nerve both showed no response.  The right median sensory nerve showed delayed distal peak latency (3.9 ms, normal less than 3.4).  The right ulnar sensory nerve showed delayed distal peak latency (3.4 ms,  normal less than 3.1).  F waves were within normal limits.All remaining nerves (as indicated in the following tables) were within normal limits.  The right iliopsoas showed increased spontaneous activity and diminished motor unit recruitment.  The right vastus medialis showed increased spontaneous activity, polyphasic motor units and diminished motor unit recruitment.  The right tibialis anterior showed increased spontaneous activity and diminished motor unit recruitment.  The right extensor hallucis longus showed increased spontaneous activity and diminished motor unit recruitment.  The left tibialis anterior showed increased spontaneous activity and diminished motor unit recruitment.  The left biceps femoris showed increased spontaneous activity.  The right biceps femoris showed decreased insertional activity.  The left extensor hallucis longus showed increased spontaneous activity and diminished motor unit recruitment.  Could not perform EMG needle testing on the paraspinal muscles as these are not reliable after surgery.     Conclusion: 1.  There is electrophysiologic evidence of acute/ongoing right L3 radiculopathy and acute/ongoing right L5 radiculopathy. 2. There is also electrophysiologic evidence for acute/ongoing left L5 radiculopathy. 3. 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 4. Recommend CT myelogram and referral to neurosurgery.  Naomie Dean, M.D.  [facility] [address] [place], [place] [address] Tel: [phone] Fax: [phone]  Verbal informed consent was obtained from the patient, patient was informed of potential risk of procedure, including bruising, bleeding, hematoma formation, infection, muscle weakness, muscle pain, numbness, among others.         MNC    Nerve / Sites Muscle Latency Ref. Amplitude Ref. Rel  Amp Segments Distance Velocity Ref. Area    ms ms mV mV %  cm m/s m/s mVms  R Median - APB     Wrist APB 4.8 ?4.4 5.0 ?4.0 100 Wrist -  APB 7   17.0     Upper arm APB 9.0  4.4  87.4 Upper arm - Wrist 21 50 ?49 16.3  R Ulnar - ADM     Wrist ADM 3.7 ?3.3 11.0 ?6.0 100 Wrist - ADM 7   26.0     B.Elbow ADM 7.7  8.0  72.5 B.Elbow - Wrist 20 50 ?49 21.5     A.Elbow ADM 9.7  7.5  93.4 A.Elbow - B.Elbow 10 49 ?49 21.1         A.Elbow - Wrist      R Peroneal - EDB     Ankle EDB 5.7 ?6.5 0.7 ?2.0 100 Ankle - EDB 9   2.4     Fib head EDB 15.8  0.5  64.3 Fib head - Ankle 27 27 ?44 1.8     Pop fossa EDB 19.9  0.3  72.3 Pop fossa - Fib head 10 24 ?44 0.9         Pop fossa - Ankle      R Tibial - AH     Ankle AH 4.9 ?5.8 3.2 ?4.0 100 Ankle - AH 9   9.4     Pop fossa AH 19.0  2.2  69.7 Pop fossa - Ankle 37 26 ?41 7.8             SNC    Nerve / Sites Rec. Site Peak Lat Ref.  Amp Ref. Segments Distance Peak Diff Ref.    ms ms V V  cm ms ms  R Radial - Anatomical snuff box (Forearm)     Forearm Wrist 3.2 ?2.9 5 ?15 Forearm - Wrist 10    R Sural - Ankle (Calf)     Calf Ankle NR ?4.4 NR ?6 Calf - Ankle 14    R Superficial peroneal - Ankle     Lat leg Ankle NR ?4.4 NR ?6 Lat leg - Ankle 14    R Median, Ulnar - Transcarpal comparison     Median Palm Wrist 2.7 ?2.2 55 ?35 Median Palm - Wrist 8       Ulnar Palm Wrist 2.6 ?2.2 37 ?12 Ulnar Palm - Wrist 8          Median Palm - Ulnar Palm  0.2 ?0.4  R Median - Orthodromic (Dig II, Mid palm)     Dig II Wrist 3.9 ?3.4 11 ?10 Dig II - Wrist 13    R Ulnar - Orthodromic, (Dig V, Mid palm)     Dig V Wrist 3.4 ?3.1 9 ?5 Dig V - Wrist 58                   F  Wave    Nerve F Lat Ref.   ms ms  R Peroneal - EDB 45.0 ?56.0  R Tibial - AH 46.5 ?56.0  R Ulnar - ADM 31.7 ?32.0           EMG Summary Table    Spontaneous MUAP Recruitment  Muscle IA Fib PSW Fasc Other Amp Dur. Poly Pattern  R. Iliopsoas Normal None 3+ None _______ Normal Normal Normal Reduced  R. Vastus medialis Normal None 3+ None _______ Normal Increased 2+ Reduced  R. Tibialis anterior Normal None 3+ None _______  Normal  Normal Normal Reduced  R. Gastrocnemius (Medial head) Normal None None None _______ Normal Normal Normal Normal  R. Extensor hallucis longus Normal None 2+ None _______ Normal Normal Normal Reduced  L. Iliopsoas Normal None None None _______ Normal Normal Normal Normal  L. Vastus medialis Normal None None None _______ Normal Normal Normal Normal  L. Tibialis anterior Normal None 2+ None _______ Normal Normal Normal Reduced  L. Gastrocnemius (Medial head) Normal None None None _______ Normal Normal Normal Normal  R. Gluteus maximus Normal None None None _______ Normal Normal Normal Normal  R. Gluteus medius Normal None None None _______ Normal Normal Normal Normal  L. Biceps femoris (short head) Normal None 3+ None _______ Normal Normal Normal Normal  R. Biceps femoris (short head) Decreased None None None _______ Normal Normal Normal Normal  R. Deltoid Normal None None None _______ Normal Normal Normal Normal  R. Triceps brachii Normal None None None _______ Normal Normal Normal Normal  R. Pronator teres Normal None None None _______ Normal Normal Normal Normal  R. First dorsal interosseous Normal None None None _______ Normal Normal Normal Normal  R. Biceps brachii Normal None None None _______ Normal Normal Normal Normal  R. Opponens pollicis Normal None None None _______ Normal Normal Normal Normal  L. Extensor hallucis longus Normal None 2+ None _______ Normal Normal Normal Reduced  R. Thoracic paraspinals (mid) Normal None None None _______ Normal Normal Normal Normal

## 2020-02-08 NOTE — Telephone Encounter (Signed)
Can you call her I am not sure what this means thanks

## 2020-02-09 ENCOUNTER — Telehealth: Payer: Self-pay | Admitting: Neurology

## 2020-02-09 NOTE — Telephone Encounter (Signed)
medicare/aarp order sent to GI. No auth they will reach out to the patient to schedule.  °

## 2020-02-10 ENCOUNTER — Telehealth: Payer: Self-pay | Admitting: *Deleted

## 2020-02-10 ENCOUNTER — Telehealth: Payer: Self-pay | Admitting: Neurology

## 2020-02-10 ENCOUNTER — Telehealth: Payer: Self-pay | Admitting: Nurse Practitioner

## 2020-02-10 NOTE — Telephone Encounter (Signed)
I have [facility] on the line asking about patient Monica [name] does Dr. Lucia Gaskins want Myelogram first or LP first  ? Dr. Lucia Gaskins is not on

## 2020-02-10 NOTE — Telephone Encounter (Signed)
Dr. Lucia Gaskins two Radiologist talked patient can have on the same day . Patient scheduled for both.

## 2020-02-10 NOTE — Telephone Encounter (Signed)
Phone call to patient to verify medication list and allergies for myelogram procedure. Pt instructed to hold nucynta and nortriptyline for 48hrs prior to myelogram appointment time. Pt verbalized understanding. Pre and post procedure instructions reviewed with pt.

## 2020-02-10 NOTE — Telephone Encounter (Signed)
LP first thanks

## 2020-02-10 NOTE — Telephone Encounter (Signed)
Elevate foot with pillow under legs - NOT under heels.

## 2020-02-10 NOTE — Telephone Encounter (Signed)
Noted Thanks Dr.Ahern Called Roberta Back she will get scheduled and call patient back.

## 2020-02-10 NOTE — Telephone Encounter (Signed)
Thanks dana if they cannot do it the same day then need CT myelogram first thanks

## 2020-02-10 NOTE — Telephone Encounter (Signed)
Derrick from [facility] called stating pt has developed small blister on right heel. She is covering it with a band aid and he stressed to her the importance of not resting the back of her heel on any surfaces for prolonged periods without changing position. Is there anything else you would like to do?

## 2020-02-16 ENCOUNTER — Telehealth: Payer: Self-pay | Admitting: *Deleted

## 2020-02-16 ENCOUNTER — Other Ambulatory Visit: Payer: Self-pay

## 2020-02-16 ENCOUNTER — Encounter: Payer: Self-pay | Admitting: Physical Medicine & Rehabilitation

## 2020-02-16 ENCOUNTER — Encounter
Payer: Worker's Compensation | Attending: Physical Medicine & Rehabilitation | Admitting: Physical Medicine & Rehabilitation

## 2020-02-16 VITALS — BP 104/64 | HR 106 | Temp 99.3°F

## 2020-02-16 DIAGNOSIS — G4486 Cervicogenic headache: Secondary | ICD-10-CM

## 2020-02-16 DIAGNOSIS — R29898 Other symptoms and signs involving the musculoskeletal system: Secondary | ICD-10-CM | POA: Insufficient documentation

## 2020-02-16 DIAGNOSIS — Z5181 Encounter for therapeutic drug level monitoring: Secondary | ICD-10-CM | POA: Diagnosis not present

## 2020-02-16 DIAGNOSIS — G894 Chronic pain syndrome: Secondary | ICD-10-CM | POA: Insufficient documentation

## 2020-02-16 DIAGNOSIS — Z79899 Other long term (current) drug therapy: Secondary | ICD-10-CM | POA: Diagnosis not present

## 2020-02-16 DIAGNOSIS — M47812 Spondylosis without myelopathy or radiculopathy, cervical region: Secondary | ICD-10-CM | POA: Insufficient documentation

## 2020-02-16 DIAGNOSIS — G43711 Chronic migraine without aura, intractable, with status migrainosus: Secondary | ICD-10-CM

## 2020-02-16 DIAGNOSIS — R519 Headache, unspecified: Secondary | ICD-10-CM

## 2020-02-16 MED ORDER — METHADONE HCL 10 MG PO TABS: 20.0000 mg | ORAL_TABLET | Freq: Three times a day (TID) | ORAL | 0 refills | Status: DC

## 2020-02-16 MED ORDER — TAPENTADOL HCL 50 MG PO TABS: 50.0000 mg | ORAL_TABLET | Freq: Two times a day (BID) | ORAL | 0 refills | Status: DC | PRN

## 2020-02-16 MED ORDER — TAPENTADOL HCL 50 MG PO TABS: 50.0000 mg | ORAL_TABLET | Freq: Every day | ORAL | 0 refills | Status: DC | PRN

## 2020-02-16 NOTE — Telephone Encounter (Signed)
The pharmacist from [facility] called and they cannot fill the Rx for Nucyntal/Tapentadol as written. While it is true that no changes were made to previous Rxs sig when refilled, but technically they should not have been filled off prior Rxs that say take daily but #60 dispense is requested.  I have created a new Rx with the sig matching the dispense from office note "Nucynta 50 mg every 12 hours as needed #60", and one additional Rx to be filled after 03/17/20 for Dr [name] to sign.  (If not, it creates problems with insurance coverage.) The pharmacist has canceled the original Rxs sent over this am.

## 2020-02-16 NOTE — Patient Instructions (Addendum)
PLEASE FEEL FREE TO CALL OUR OFFICE WITH ANY PROBLEMS OR QUESTIONS ([phone])      

## 2020-02-16 NOTE — Telephone Encounter (Signed)
New rx'es sent in.  Thanks Sybil!

## 2020-02-16 NOTE — Progress Notes (Signed)
Subjective:    Patient ID: Monica [name], female    DOB: 10-Jun-1951, 69 y.o.   MRN: [id number]  HPI  Monica [name] is here in follow up of her chronic pain. She continues to have shooting pain in both legs, right more than left. She feels weaker then last time I saw her. Dr. Lucia Gaskins has been seeing her in regard to these symptoms. EMG/NCS demonstrated polyradiculopathy and neuropathy.  She is set up for a lumbar puncture and CT myelogram tomorrow and she has been referred to neurosurgery as well for opinion.  She has received a few sessions of therapy but has been limited with that for the most part.  She was in the hospital with acute renal failure which responded to fluids.  This took place about a week or 2 after I last saw her.  Since the hospitalization she has been much more alert and feeling better has a whole.  We have talked last time about getting to wean her methadone but then when she was hospitalized her husband decided to hold off.  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   Pain Inventory Average Pain 5 Pain Right Now 5 My pain is constant  In the last 24 hours, has pain interfered with the following? General activity 4 Relation with others 3 Enjoyment of life 3 What TIME of day is your pain at its worst? all Sleep (in general) Poor  Pain is worse with: walking, bending, sitting, inactivity, standing and some activites Pain improves with: medication Relief from Meds: 3  Mobility walk with assistance use a walker ability to climb steps?  yes do you drive?  no use a wheelchair  Function disabled: date disabled .  Neuro/Psych weakness numbness trouble walking anxiety  Prior Studies Any changes since last visit?  no  Physicians involved in your care Any changes since last visit?  no   Family History  Problem Relation Age of Onset  . Colon cancer Mother 4  . Heart disease Father   . Diabetes Father   . Diabetes Paternal Grandmother   . Heart disease  Paternal Grandfather   . Diverticulitis Daughter    Social History   Socioeconomic History  . Marital status: Married    Spouse name: Not on file  . Number of children: 3  . Years of education: Not on file  . Highest education level: Not on file  Occupational History  . Occupation: disabled    Associate Professor: UNEMPLOYED  Tobacco Use  . Smoking status: Never Smoker  . Smokeless tobacco: Never Used  Vaping Use  . Vaping Use: Never used  Substance and Sexual Activity  . Alcohol use: No  . Drug use: No  . Sexual activity: Yes    Birth control/protection: Post-menopausal  Other Topics Concern  . Not on file  Social History Narrative   Lives at home with husband    Right handed   Caffeine: none   Social Determinants of Health   Financial Resource Strain:   . Difficulty of Paying Living Expenses:   Food Insecurity:   . Worried About Programme researcher, broadcasting/film/video in the Last Year:   . Barista in the Last Year:   Transportation Needs:   . Freight forwarder (Medical):   Marland Kitchen Lack of Transportation (Non-Medical):   Physical Activity:   . Days of Exercise per Week:   . Minutes of Exercise per Session:   Stress:   . Feeling of Stress :  Social Connections:   . Frequency of Communication with Friends and Family:   . Frequency of Social Gatherings with Friends and Family:   . Attends Religious Services:   . Active Member of Clubs or Organizations:   . Attends Banker Meetings:   Marland Kitchen Marital Status:    Past Surgical History:  Procedure Laterality Date  . BREAST LUMPECTOMY     left  . CATARACT EXTRACTION W/PHACO Left 10/07/2016   Procedure: CATARACT EXTRACTION PHACO AND INTRAOCULAR LENS PLACEMENT (IOC);  Surgeon: Gemma Payor, MD;  Location: [facility];  Service: Ophthalmology;  Laterality: Left;  CDE: 4.98  . CATARACT EXTRACTION W/PHACO Right 10/21/2016   Procedure: CATARACT EXTRACTION PHACO AND INTRAOCULAR LENS PLACEMENT RIGHT EYE CDE - 6.24;  Surgeon: Gemma Payor, MD;   Location: [facility];  Service: Ophthalmology;  Laterality: Right;  right  . CHOLECYSTECTOMY    . COLONOSCOPY  11/17/08   ext hemorrhoids  . CRANIOTOMY     secondary TBI  . CYST EXCISION     left wrist  . ESOPHAGOGASTRODUODENOSCOPY  04/05/10   [name] (11F),erosive reflux esophagitis, small hiatal hernia,  . ESOPHAGOGASTRODUODENOSCOPY (EGD) WITH PROPOFOL N/A 08/30/2019   Procedure: ESOPHAGOGASTRODUODENOSCOPY (EGD) WITH PROPOFOL;  Surgeon: Corbin Ade, MD;  Location: [facility];  Service: Endoscopy;  Laterality: N/A;  3:30pm - pt knows to arrive at 9:45  . EXTRACORPOREAL SHOCK WAVE LITHOTRIPSY Left 12/13/2019   Procedure: EXTRACORPOREAL SHOCK WAVE LITHOTRIPSY (ESWL);  Surgeon: Jerilee Field, MD;  Location: [facility];  Service: Urology;  Laterality: Left;  . FINGER SURGERY     removal cyst left pinky  . gastro empy study  03/03/10   normal  . HAND SURGERY    . MALONEY DILATION N/A 08/30/2019   Procedure: Elease Hashimoto DILATION;  Surgeon: Corbin Ade, MD;  Location: [facility];  Service: Endoscopy;  Laterality: N/A;  . NECK SURGERY  1998  . NEPHROLITHOTOMY  05/13/2012   Procedure: NEPHROLITHOTOMY PERCUTANEOUS;  Surgeon: Marcine Matar, MD;  Location: [facility];  Service: Urology;  Laterality: Left;      Past Medical History:  Diagnosis Date  . Arthritis   . Cervical facet joint syndrome 04/22/2012  . Cervicalgia   . Chronic headaches    Dr Hermelinda Medicus [place] [facility]  . Chronic nausea    normal GES   . Complication of anesthesia    had problem with local anesthesia-tried to assist Physician  . DM (diabetes mellitus) (HCC)   . Elevated liver enzymes    hx of, normal 03/2010  . GERD (gastroesophageal reflux disease) 05/31/2011   History Schatzki's ring, erosive reflux esophagitis, status post last EGD and dilation 04/05/10   . Glaucoma    lens in both eyes  . Hemorrhoids 11/17/08   Colonoscopy Dr Karilyn Cota  . Occipital neuralgia   . PTSD (post-traumatic  stress disorder)   . Renal lithiasis   . 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 Torticollis   . Unspecified musculoskeletal disorders and symptoms referable to neck    cervical/trapezius   There were no vitals taken for this visit.  Opioid Risk Score:   Fall Risk Score:  `1  Depression screen PHQ 2/9  Depression screen [facility] 2/9 01/11/2020 10/29/2019 10/22/2019 07/16/2019 06/29/2019 12/16/2018 10/19/2018  Decreased Interest 0 0 0 0 1 1 1   Down, Depressed, Hopeless 1 0 0 0 1 1 1   PHQ - 2 Score 1 0 0 0 2 2 2   Altered  sleeping - - - - 3 - -  Tired, decreased energy - - - - 2 - -  Change in appetite - - - - 3 - -  Feeling bad or failure about yourself  - - - - 1 - -  Trouble concentrating - - - - 3 - -  Moving slowly or fidgety/restless - - - - 2 - -  Suicidal thoughts - - - - 0 - -  PHQ-9 Score - - - - 16 - -  Difficult doing work/chores - - - - Not difficult at all - -  Some recent data might be hidden    Review of Systems     Objective:   Physical Exam   Physical Exam Gen: Lethargic appearing, sitting in wheelchair.  Has been with her HEENT: oral mucosa pink and moist, NCAT Cardio: Reg rate Chest: normal effort, normal rate of breathing Abd: soft, non-distended Ext: no edema Skin: Small ankle wound dressed Neuro: much more alert. Moves all 4's. Both legs weak with sensory loss. DTR's absent.  Musculoskeletal: Mid cervical tenderness with extension more than flexion. Psych:alert, pleasant           Assessment & Plan:  Assessment:  1. 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  2. Posttraumatic stress syndrome.  3. Greater occipital neuralgia (bilateral) confirmed by response to recent nerve block  4. 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 Increased LE weakness, pain. Lumbar MRI without source for such diffuse symptoms. Arms also weak on exam.   Neurology work-up  ongoing.  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 Continue methadone rx. RF today. #180 10mg  tablets        Ultimately want decrease by 15mg  daily by end of the next 4 weeks.  will hold on decrease given her ongoing work up. I don't want to muddy the waters.             - Nucynta 50 mg every 12 hours as needed #60    We will continue the controlled substance monitoring program, this consists of regular clinic visits, examinations, routine drug screening, pill counts as well as use of Controlled Substance Reporting System. [place] today.      -drug swab  2. History of recent greater occipital nerve RF with transient effects.  3. Continue topamax.               4. 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  5.  Ongoing work up per neurology   -CT myelo and LP pending for tomorrow        -neuro follow up, 48hr EEG to be scheduled 6. Zanaflex 2mg  q6 prn for spasm. generally uses in evenings. 7. Continue nortriptyline 25mg  qhs for sleep, leg/neuro pain.    15 minutes of time was spent with the patient and her husband in examination and treatment plan discussion.  I will see her back in about 2 months

## 2020-02-17 ENCOUNTER — Telehealth: Payer: Self-pay | Admitting: Neurology

## 2020-02-17 ENCOUNTER — Ambulatory Visit
Admission: RE | Admit: 2020-02-17 | Discharge: 2020-02-17 | Disposition: A | Discharge status: Home / Self Care | Payer: Medicare Other | Source: Ambulatory Visit | Attending: Neurology | Admitting: Neurology

## 2020-02-17 ENCOUNTER — Other Ambulatory Visit: Payer: Self-pay | Admitting: Physical Medicine & Rehabilitation

## 2020-02-17 ENCOUNTER — Other Ambulatory Visit: Payer: Medicare Other

## 2020-02-17 ENCOUNTER — Other Ambulatory Visit: Payer: Self-pay | Admitting: Neurology

## 2020-02-17 VITALS — BP 135/69 | HR 96

## 2020-02-17 DIAGNOSIS — M62561 Muscle wasting and atrophy, not elsewhere classified, right lower leg: Secondary | ICD-10-CM

## 2020-02-17 DIAGNOSIS — G894 Chronic pain syndrome: Secondary | ICD-10-CM

## 2020-02-17 DIAGNOSIS — M5416 Radiculopathy, lumbar region: Secondary | ICD-10-CM

## 2020-02-17 DIAGNOSIS — Z5181 Encounter for therapeutic drug level monitoring: Secondary | ICD-10-CM

## 2020-02-17 DIAGNOSIS — W19XXXA Unspecified fall, initial encounter: Secondary | ICD-10-CM

## 2020-02-17 DIAGNOSIS — G43711 Chronic migraine without aura, intractable, with status migrainosus: Secondary | ICD-10-CM

## 2020-02-17 DIAGNOSIS — G610 Guillain-Barre syndrome: Secondary | ICD-10-CM

## 2020-02-17 DIAGNOSIS — M47812 Spondylosis without myelopathy or radiculopathy, cervical region: Secondary | ICD-10-CM

## 2020-02-17 DIAGNOSIS — Z79899 Other long term (current) drug therapy: Secondary | ICD-10-CM

## 2020-02-17 DIAGNOSIS — R29898 Other symptoms and signs involving the musculoskeletal system: Secondary | ICD-10-CM

## 2020-02-17 DIAGNOSIS — G43011 Migraine without aura, intractable, with status migrainosus: Secondary | ICD-10-CM

## 2020-02-17 DIAGNOSIS — G4486 Cervicogenic headache: Secondary | ICD-10-CM

## 2020-02-17 DIAGNOSIS — Z79891 Long term (current) use of opiate analgesic: Secondary | ICD-10-CM

## 2020-02-17 DIAGNOSIS — M7918 Myalgia, other site: Secondary | ICD-10-CM

## 2020-02-17 DIAGNOSIS — G541 Lumbosacral plexus disorders: Secondary | ICD-10-CM

## 2020-02-17 DIAGNOSIS — G544 Lumbosacral root disorders, not elsewhere classified: Secondary | ICD-10-CM

## 2020-02-17 MED ORDER — IOPAMIDOL (ISOVUE-M 200) INJECTION 41%
18.0000 mL | Freq: Once | INTRAMUSCULAR | Status: AC
  Administered 2020-02-17: 18 mL via INTRATHECAL

## 2020-02-17 MED ORDER — ONDANSETRON HCL 4 MG/2ML IJ SOLN: 4.0000 mg | Freq: Four times a day (QID) | INTRAMUSCULAR | Status: DC | PRN

## 2020-02-17 MED ORDER — PREDNISONE 20 MG PO TABS: 60.0000 mg | ORAL_TABLET | Freq: Every day | ORAL | 0 refills | Status: DC

## 2020-02-17 MED ORDER — ALPRAZOLAM 0.25 MG PO TABS: ORAL_TABLET | ORAL | 0 refills | Status: DC

## 2020-02-17 MED ORDER — DIAZEPAM 5 MG PO TABS
5.0000 mg | ORAL_TABLET | Freq: Once | ORAL | Status: AC
  Administered 2020-02-17: 5 mg via ORAL

## 2020-02-17 NOTE — Telephone Encounter (Signed)
I let the pt's husband know that Xanax 0.25 mg was sent to the pharmacy. We discussed the instructions. His questions were answered. He was very Adult nurse.

## 2020-02-17 NOTE — Telephone Encounter (Signed)
Patient is scheduled MRI for tomorrow 02/18/20 at [facility]  The patient is claustrophobic and will need something order for her.

## 2020-02-17 NOTE — Telephone Encounter (Signed)
I let the pt's husband know that  Prednisone 60 mg 5 day course was sent to the pharmacy. We discussed the instructions. His questions were answered. He was very Adult nurse.

## 2020-02-17 NOTE — Telephone Encounter (Signed)
Per [place] registry, pt fills Nucynta and Methadone but no current fills of any benzodiazepines (was given 3 tabs of Diazepam in March 2021).

## 2020-02-17 NOTE — Telephone Encounter (Signed)
No pinched nerves seen on CT Myelogram. I would like to wait for the results of the lumbar puncture. I also would like to start patient on steroids to see if that will help. Her nerve roots looked a little enlarged on CT lumbar spine indicating inflammation but not due to a pinched nerve. I'd like a follow up test of the pelvis with contrast to look for something called plexopathy. In the meantime if this is inflammation the steroids should help. Her HgbA1c is 5.7 so she should be ok for a 5-day burst of 60mg  while I get the other test ordered. Unfortunately if she is still in a great deal of pain the only other way to get anything sooner is to go to the hospital. I will call in the steroids.

## 2020-02-17 NOTE — Telephone Encounter (Signed)
Medicare/aarp no auth.  I emailed Rinaldo Cloud at [facility] if they had any availability.

## 2020-02-17 NOTE — Discharge Instructions (Signed)
Myelogram Discharge Instructions  1. Go home and rest quietly for the next 24 hours.  It is important to lie flat for the next 24 hours.  Get up only to go to the restroom.  You may lie in the bed or on a couch on your back, your stomach, your left side or your right side.  You may have one pillow under your head.  You may have pillows between your knees while you are on your side or under your knees while you are on your back.  2. DO NOT drive today.  Recline the seat as far back as it will go, while still wearing your seat belt, on the way home.  3. You may get up to go to the bathroom as needed.  You may sit up for 10 minutes to eat.  You may resume your normal diet and medications unless otherwise indicated.  Drink lots of extra fluids today and tomorrow.  4. The incidence of headache, nausea, or vomiting is about 5% (one in 20 patients).  If you develop a headache, lie flat and drink plenty of fluids until the headache goes away.  Caffeinated beverages may be helpful.  If you develop severe nausea and vomiting or a headache that does not go away with flat bed rest, call [phone].  5. You may resume normal activities after your 24 hours of bed rest is over; however, do not exert yourself strongly or do any heavy lifting tomorrow. If when you get up you have a headache when standing, go back to bed and force fluids for another 24 hours.  6. Call your physician for a follow-up appointment.  The results of your myelogram will be sent directly to your physician by the following day.  7. If you have any questions or if complications develop after you arrive home, please call [phone].  Discharge instructions have been explained to the patient.  The patient, or the person responsible for the patient, fully understands these instructions.  YOU MAY RESTART YOUR NUCYNTA AND NORTRYPTYLINE TOMORROW 02/18/20 AT 11:30AM.

## 2020-02-17 NOTE — Telephone Encounter (Signed)
Dr. Lucia Gaskins placed order for Prednisone 20 mg (take 3 tablets daily with breakfast) x 5 days.

## 2020-02-17 NOTE — Telephone Encounter (Addendum)
I spoke with the pt's husband [name] (on [place]) and discussed results of LP noted below and that labs are pending, so far CSF looks normal as far as color. We discussed results of the CT myelogram including what Dr. Lucia Gaskins advises for next steps (MRI pelvis to check for plexopathy) and steroids to see if this helps in the meantime if this is inflammation. He verbalized understanding. His questions were answered. He is amenable to the steroids and MRI pelvis. He had to buy the pt a wheelchair to use today. She went to an appt using walker but needed the wheelchair to get into the house. He wants to get to the bottom of this. He is aware he will receive a separate call to schedule MRI and we will be in touch as we get more results. I advised pt should take steroids with food and advised of common side effects including increased appetite, insomnia, nervousness. He verbalized appreciation for the call.     Anson Fret, MD  02/17/2020 2:33 PM EDT Back to Top    Normal looking csf fluid   Anson Fret, MD  02/17/2020 2:34 PM EDT Back to Top    LP appears successful with normal looking csf, pending labs (may take a few days)

## 2020-02-18 ENCOUNTER — Ambulatory Visit
Admission: RE | Admit: 2020-02-18 | Discharge: 2020-02-18 | Disposition: A | Discharge status: Home / Self Care | Payer: Medicare Other | Source: Ambulatory Visit | Attending: Neurology | Admitting: Neurology

## 2020-02-18 ENCOUNTER — Other Ambulatory Visit: Payer: Self-pay

## 2020-02-18 DIAGNOSIS — G541 Lumbosacral plexus disorders: Secondary | ICD-10-CM

## 2020-02-18 DIAGNOSIS — G544 Lumbosacral root disorders, not elsewhere classified: Secondary | ICD-10-CM

## 2020-02-18 LAB — CYTOLOGY - NON PAP

## 2020-02-18 MED ORDER — GADOBENATE DIMEGLUMINE 529 MG/ML IV SOLN
10.0000 mL | Freq: Once | INTRAVENOUS | Status: AC | PRN
  Administered 2020-02-18: 10 mL via INTRAVENOUS

## 2020-02-20 LAB — DRUG TOX MONITOR 1 W/CONF, ORAL FLD
Amphetamines: NEGATIVE ng/mL (ref <10)
Barbiturates: NEGATIVE ng/mL (ref <10)
Benzodiazepines: NEGATIVE ng/mL (ref <0.50)
Buprenorphine: NEGATIVE ng/mL (ref <0.10)
Cocaine: NEGATIVE ng/mL (ref <5.0)
EDDP: NEGATIVE ng/mL (ref <5.0)
Fentanyl: NEGATIVE ng/mL (ref <0.10)
Heroin Metabolite: NEGATIVE ng/mL (ref <1.0)
MARIJUANA: NEGATIVE ng/mL (ref <2.5)
MDMA: NEGATIVE ng/mL (ref <10)
Meprobamate: NEGATIVE ng/mL (ref <2.5)
Methadone: 79.3 ng/mL — ABNORMAL HIGH (ref <5.0)
Methadone: POSITIVE ng/mL — AB (ref <5.0)
Nicotine Metabolite: NEGATIVE ng/mL (ref <5.0)
Opiates: NEGATIVE ng/mL (ref <2.5)
Phencyclidine: NEGATIVE ng/mL (ref <10)
Tapentadol: 27.1 ng/mL — ABNORMAL HIGH (ref <5.0)
Tapentadol: POSITIVE ng/mL — AB (ref <5.0)
Tramadol: NEGATIVE ng/mL (ref <5.0)
Zolpidem: NEGATIVE ng/mL (ref <5.0)

## 2020-02-20 LAB — DRUG TOX ALC METAB W/CON, ORAL FLD: Alcohol Metabolite: NEGATIVE ng/mL (ref <25)

## 2020-02-21 ENCOUNTER — Encounter: Payer: Self-pay | Admitting: *Deleted

## 2020-02-21 ENCOUNTER — Telehealth: Payer: Self-pay | Admitting: *Deleted

## 2020-02-21 LAB — CSF CELL COUNT WITH DIFFERENTIAL
RBC Count, CSF: 0 cells/uL
WBC, CSF: 1 cells/uL (ref 0–5)

## 2020-02-21 LAB — CSF CULTURE W GRAM STAIN
GRAM STAIN:: NONE SEEN
MICRO NUMBER:: 10680650
Result:: NO GROWTH
SPECIMEN QUALITY:: ADEQUATE

## 2020-02-21 LAB — PROTEIN, CSF: Total Protein, CSF: 127 mg/dL — ABNORMAL HIGH (ref 15–60)

## 2020-02-21 LAB — GLUCOSE, CSF: Glucose, CSF: 183 mg/dL — ABNORMAL HIGH (ref 40–80)

## 2020-02-21 NOTE — Telephone Encounter (Signed)
I let Monica [name] w/ [facility] know that we have stat IVIG that Dr. Lucia Gaskins is ordering. Info to be faxed to the attn of [place] at [phone].   Order written, printed and signed, also printed copies of pt's labs, imaging results, EMG results, office note, and insurance/demographic information. Faxed all info to [employer]. Received a receipt of confirmation.

## 2020-02-21 NOTE — Telephone Encounter (Signed)
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

## 2020-02-22 ENCOUNTER — Telehealth: Payer: Self-pay | Admitting: *Deleted

## 2020-02-22 ENCOUNTER — Telehealth: Payer: Self-pay | Admitting: Neurology

## 2020-02-22 NOTE — Telephone Encounter (Signed)
Patient  Daughter wants to if she can see any one other than Venetia Maxon so she can get her in sooner .  Patient daughter is also calling for test results Apt august 18th I have left message to see about sooner apt for Dr. Venetia Maxon .

## 2020-02-22 NOTE — Telephone Encounter (Signed)
The CT myelogram did not show that the nerves are being pinched in the back. Dr. Venetia Maxon can;t help them. I think this is inflammation of the nerves of unknown cause which is why I was hoping the steroids would help. I am going to start IVIG which is a treatment for "inflammatory polyradiculoneuropathy". If she has gone into a wheelchair the fastest way to get IVIG or another treatment is going to the ED and seeing if we can get her admitted since her lumbar puncture showed elevated protein in the fluid around the nerves of the back showing break down of the nerves due to inflammation then she may be admitted. If they would like to try then I can discuss with the neurology team inpatient

## 2020-02-22 NOTE — Telephone Encounter (Signed)
We received IVIG orders for Dr. Jaynee Eagles to sign. Unbranded IVIG 115 gm (2 g/kg x 57.8 kg) over 5 days x 1 cycle via an electronic infusion pump. Premedication Acetaminophen 650 mg and Diphenhydramine 50 mg 30 minutes prior to infusion. IV access is to be flushed by nurse: Saline 0.9% 5 mLs pre/post infusions, If needed Heparin 10 units/mL 3 mLs as a final lock for patency. Anaphylaxis kit to be made available for the home infusions containing Epinephrine 0.3 mg x 2 for severe reaction, 500 mL Sodium Chloride 0.9%, Diphenhydramine 50 mg IV (25-50 mg IM or slow IV push for moderate reaction) and 25 mg capsules (25-50 mg PO for mild reaction). Orders signed by Dr. Jaynee Eagles. I called [facility] to confirm fax number. Orders faxed to [phone]. Received a receipt of confirmation.

## 2020-02-22 NOTE — Telephone Encounter (Signed)
-----   Message from Anson Fret, MD sent at 02/22/2020  7:33 AM EDT ----- Unfortunately I was told patient declined contrast so I couldn't see what I wanted to but I am going to treat with IVIG. In the meantime if the steroids are helping I can place a refill please let me know thanks

## 2020-02-22 NOTE — Telephone Encounter (Signed)
Spoke with pt's husband and advised him of message from Dr. Lucia Gaskins about the MRI pelvis and steroids. He stated the pt has not improved with the steroids. Pain is the same and mobility is no better. She has been in a wheelchair since the Myelogram. They are waiting 5 weeks to see Dr. Venetia Maxon. He said to let him know if they need to take a sooner appt with another surgeon. He also said if pt needs another MRI pelvis, pt will need open as she was dealing with claustrophobia and felt her leg was on fire in traditional MRI machine.

## 2020-02-22 NOTE — Telephone Encounter (Signed)
I would cancel appointment with Dr Venetia Maxon since we did not see any evidence of nerve pinching coming from the lumbar spine. I think this is something different and we are trying to treat her with IVIG, bethany has discussed with husband thanks

## 2020-02-23 ENCOUNTER — Inpatient Hospital Stay (HOSPITAL_COMMUNITY)
Admission: EM | Admit: 2020-02-23 | Discharge: 2020-03-03 | DRG: 041 | Disposition: A | Discharge status: Rehabilitation Facility | Payer: Medicare Other | Source: Ambulatory Visit | Attending: Internal Medicine | Admitting: Internal Medicine

## 2020-02-23 ENCOUNTER — Encounter (HOSPITAL_COMMUNITY): Payer: Self-pay | Admitting: Emergency Medicine

## 2020-02-23 ENCOUNTER — Telehealth: Payer: Self-pay | Admitting: *Deleted

## 2020-02-23 DIAGNOSIS — E1144 Type 2 diabetes mellitus with diabetic amyotrophy: Secondary | ICD-10-CM | POA: Diagnosis present

## 2020-02-23 DIAGNOSIS — G8929 Other chronic pain: Secondary | ICD-10-CM | POA: Diagnosis present

## 2020-02-23 DIAGNOSIS — K5903 Drug induced constipation: Secondary | ICD-10-CM | POA: Diagnosis not present

## 2020-02-23 DIAGNOSIS — E1142 Type 2 diabetes mellitus with diabetic polyneuropathy: Secondary | ICD-10-CM | POA: Diagnosis present

## 2020-02-23 DIAGNOSIS — Z79891 Long term (current) use of opiate analgesic: Secondary | ICD-10-CM

## 2020-02-23 DIAGNOSIS — F1190 Opioid use, unspecified, uncomplicated: Secondary | ICD-10-CM | POA: Diagnosis present

## 2020-02-23 DIAGNOSIS — E876 Hypokalemia: Secondary | ICD-10-CM | POA: Diagnosis present

## 2020-02-23 DIAGNOSIS — M5416 Radiculopathy, lumbar region: Secondary | ICD-10-CM | POA: Diagnosis present

## 2020-02-23 DIAGNOSIS — E872 Acidosis: Secondary | ICD-10-CM | POA: Diagnosis present

## 2020-02-23 DIAGNOSIS — R29898 Other symptoms and signs involving the musculoskeletal system: Secondary | ICD-10-CM

## 2020-02-23 DIAGNOSIS — T40605A Adverse effect of unspecified narcotics, initial encounter: Secondary | ICD-10-CM | POA: Diagnosis present

## 2020-02-23 DIAGNOSIS — D696 Thrombocytopenia, unspecified: Secondary | ICD-10-CM | POA: Diagnosis present

## 2020-02-23 DIAGNOSIS — M62830 Muscle spasm of back: Secondary | ICD-10-CM | POA: Diagnosis not present

## 2020-02-23 DIAGNOSIS — Z6823 Body mass index (BMI) 23.0-23.9, adult: Secondary | ICD-10-CM | POA: Diagnosis not present

## 2020-02-23 DIAGNOSIS — E785 Hyperlipidemia, unspecified: Secondary | ICD-10-CM | POA: Diagnosis present

## 2020-02-23 DIAGNOSIS — E871 Hypo-osmolality and hyponatremia: Secondary | ICD-10-CM

## 2020-02-23 DIAGNOSIS — G43909 Migraine, unspecified, not intractable, without status migrainosus: Secondary | ICD-10-CM

## 2020-02-23 DIAGNOSIS — E1140 Type 2 diabetes mellitus with diabetic neuropathy, unspecified: Secondary | ICD-10-CM | POA: Diagnosis present

## 2020-02-23 DIAGNOSIS — G619 Inflammatory polyneuropathy, unspecified: Secondary | ICD-10-CM | POA: Diagnosis present

## 2020-02-23 DIAGNOSIS — E1165 Type 2 diabetes mellitus with hyperglycemia: Secondary | ICD-10-CM | POA: Diagnosis present

## 2020-02-23 DIAGNOSIS — G610 Guillain-Barre syndrome: Principal | ICD-10-CM | POA: Diagnosis present

## 2020-02-23 DIAGNOSIS — L89312 Pressure ulcer of right buttock, stage 2: Secondary | ICD-10-CM | POA: Diagnosis not present

## 2020-02-23 DIAGNOSIS — Z886 Allergy status to analgesic agent status: Secondary | ICD-10-CM

## 2020-02-23 DIAGNOSIS — F4310 Post-traumatic stress disorder, unspecified: Secondary | ICD-10-CM | POA: Diagnosis present

## 2020-02-23 DIAGNOSIS — Z20822 Contact with and (suspected) exposure to covid-19: Secondary | ICD-10-CM | POA: Diagnosis present

## 2020-02-23 DIAGNOSIS — D649 Anemia, unspecified: Secondary | ICD-10-CM | POA: Diagnosis present

## 2020-02-23 DIAGNOSIS — M47892 Other spondylosis, cervical region: Secondary | ICD-10-CM | POA: Diagnosis present

## 2020-02-23 DIAGNOSIS — Z87892 Personal history of anaphylaxis: Secondary | ICD-10-CM

## 2020-02-23 DIAGNOSIS — F329 Major depressive disorder, single episode, unspecified: Secondary | ICD-10-CM | POA: Diagnosis present

## 2020-02-23 DIAGNOSIS — Z993 Dependence on wheelchair: Secondary | ICD-10-CM | POA: Diagnosis not present

## 2020-02-23 DIAGNOSIS — F4312 Post-traumatic stress disorder, chronic: Secondary | ICD-10-CM | POA: Diagnosis present

## 2020-02-23 DIAGNOSIS — R7989 Other specified abnormal findings of blood chemistry: Secondary | ICD-10-CM

## 2020-02-23 DIAGNOSIS — R296 Repeated falls: Secondary | ICD-10-CM | POA: Diagnosis present

## 2020-02-23 DIAGNOSIS — E440 Moderate protein-calorie malnutrition: Secondary | ICD-10-CM | POA: Diagnosis present

## 2020-02-23 DIAGNOSIS — M1990 Unspecified osteoarthritis, unspecified site: Secondary | ICD-10-CM | POA: Diagnosis present

## 2020-02-23 DIAGNOSIS — G629 Polyneuropathy, unspecified: Secondary | ICD-10-CM

## 2020-02-23 DIAGNOSIS — G894 Chronic pain syndrome: Secondary | ICD-10-CM | POA: Diagnosis not present

## 2020-02-23 DIAGNOSIS — L89612 Pressure ulcer of right heel, stage 2: Secondary | ICD-10-CM | POA: Diagnosis not present

## 2020-02-23 DIAGNOSIS — R945 Abnormal results of liver function studies: Secondary | ICD-10-CM | POA: Diagnosis present

## 2020-02-23 DIAGNOSIS — Z888 Allergy status to other drugs, medicaments and biological substances status: Secondary | ICD-10-CM

## 2020-02-23 DIAGNOSIS — R519 Headache, unspecified: Secondary | ICD-10-CM | POA: Diagnosis not present

## 2020-02-23 DIAGNOSIS — Z7984 Long term (current) use of oral hypoglycemic drugs: Secondary | ICD-10-CM

## 2020-02-23 DIAGNOSIS — Z885 Allergy status to narcotic agent status: Secondary | ICD-10-CM

## 2020-02-23 DIAGNOSIS — Z79899 Other long term (current) drug therapy: Secondary | ICD-10-CM

## 2020-02-23 DIAGNOSIS — D72829 Elevated white blood cell count, unspecified: Secondary | ICD-10-CM | POA: Diagnosis not present

## 2020-02-23 DIAGNOSIS — M21372 Foot drop, left foot: Secondary | ICD-10-CM | POA: Diagnosis present

## 2020-02-23 DIAGNOSIS — M21371 Foot drop, right foot: Secondary | ICD-10-CM | POA: Diagnosis present

## 2020-02-23 DIAGNOSIS — G4700 Insomnia, unspecified: Secondary | ICD-10-CM | POA: Diagnosis present

## 2020-02-23 DIAGNOSIS — Z7952 Long term (current) use of systemic steroids: Secondary | ICD-10-CM

## 2020-02-23 DIAGNOSIS — N289 Disorder of kidney and ureter, unspecified: Secondary | ICD-10-CM | POA: Diagnosis present

## 2020-02-23 DIAGNOSIS — Z833 Family history of diabetes mellitus: Secondary | ICD-10-CM

## 2020-02-23 DIAGNOSIS — K219 Gastro-esophageal reflux disease without esophagitis: Secondary | ICD-10-CM | POA: Diagnosis present

## 2020-02-23 LAB — COMPREHENSIVE METABOLIC PANEL
ALT: 22 U/L (ref 0–44)
AST: 18 U/L (ref 15–41)
Albumin: 4 g/dL (ref 3.5–5.0)
Alkaline Phosphatase: 144 U/L — ABNORMAL HIGH (ref 38–126)
Anion gap: 17 — ABNORMAL HIGH (ref 5–15)
BUN: 30 mg/dL — ABNORMAL HIGH (ref 8–23)
CO2: 21 mmol/L — ABNORMAL LOW (ref 22–32)
Calcium: 10.1 mg/dL (ref 8.9–10.3)
Chloride: 99 mmol/L (ref 98–111)
Creatinine, Ser: 0.94 mg/dL (ref 0.44–1.00)
GFR calc Af Amer: >60 mL/min (ref >60)
GFR calc non Af Amer: >60 mL/min (ref >60)
Glucose, Bld: 325 mg/dL — ABNORMAL HIGH (ref 70–99)
Potassium: 4.4 mmol/L (ref 3.5–5.1)
Sodium: 137 mmol/L (ref 135–145)
Total Bilirubin: 0.5 mg/dL (ref 0.3–1.2)
Total Protein: 7.5 g/dL (ref 6.5–8.1)

## 2020-02-23 LAB — CSF CULTURE W GRAM STAIN

## 2020-02-23 LAB — CBC
HCT: 43.7 % (ref 36.0–46.0)
Hemoglobin: 13.6 g/dL (ref 12.0–15.0)
MCH: 29.8 pg (ref 26.0–34.0)
MCHC: 31.1 g/dL (ref 30.0–36.0)
MCV: 95.6 fL (ref 80.0–100.0)
Platelets: 234 10*3/uL (ref 150–400)
RBC: 4.57 MIL/uL (ref 3.87–5.11)
RDW: 14.3 % (ref 11.5–15.5)
WBC: 11.9 10*3/uL — ABNORMAL HIGH (ref 4.0–10.5)
nRBC: 0 % (ref 0.0–0.2)

## 2020-02-23 LAB — FOLATE: Folate: 45 ng/mL (ref >5.9)

## 2020-02-23 LAB — PROTIME-INR
INR: 1 (ref 0.8–1.2)
Prothrombin Time: 12.3 seconds (ref 11.4–15.2)

## 2020-02-23 LAB — HEMOGLOBIN A1C
Hgb A1c MFr Bld: 8.6 % — ABNORMAL HIGH (ref 4.8–5.6)
Mean Plasma Glucose: 200.12 mg/dL

## 2020-02-23 LAB — VITAMIN B12: Vitamin B-12: 600 pg/mL (ref 180–914)

## 2020-02-23 LAB — SARS CORONAVIRUS 2 BY RT PCR (HOSPITAL ORDER, PERFORMED IN ~~LOC~~ HOSPITAL LAB): SARS Coronavirus 2: NEGATIVE

## 2020-02-23 MED ORDER — METHADONE HCL 5 MG PO TABS
20.0000 mg | ORAL_TABLET | Freq: Three times a day (TID) | ORAL | Status: DC
  Administered 2020-02-24 – 2020-03-03 (×23): 20 mg via ORAL
  Filled 2020-02-23 (×5): qty 4
  Filled 2020-02-23: qty 2
  Filled 2020-02-23 (×12): qty 4
  Filled 2020-02-23: qty 2
  Filled 2020-02-23 (×7): qty 4

## 2020-02-23 MED ORDER — MAGNESIUM OXIDE 400 (241.3 MG) MG PO TABS
400.0000 mg | ORAL_TABLET | Freq: Two times a day (BID) | ORAL | Status: DC
  Administered 2020-02-24 – 2020-03-03 (×17): 400 mg via ORAL
  Filled 2020-02-23 (×18): qty 1

## 2020-02-23 MED ORDER — TIZANIDINE HCL 4 MG PO TABS
2.0000 mg | ORAL_TABLET | Freq: Four times a day (QID) | ORAL | Status: DC | PRN
  Administered 2020-02-25 – 2020-03-03 (×12): 2 mg via ORAL
  Filled 2020-02-23 (×13): qty 1

## 2020-02-23 MED ORDER — INSULIN ASPART 100 UNIT/ML ~~LOC~~ SOLN
10.0000 [IU] | Freq: Once | SUBCUTANEOUS | Status: AC
  Administered 2020-02-24: 10 [IU] via SUBCUTANEOUS

## 2020-02-23 MED ORDER — TAPENTADOL HCL 50 MG PO TABS
50.0000 mg | ORAL_TABLET | Freq: Two times a day (BID) | ORAL | Status: DC | PRN
  Administered 2020-02-28 – 2020-02-29 (×2): 50 mg via ORAL
  Filled 2020-02-23 (×3): qty 1

## 2020-02-23 MED ORDER — TOPIRAMATE 100 MG PO TABS
200.0000 mg | ORAL_TABLET | Freq: Every day | ORAL | Status: DC
  Administered 2020-02-24 – 2020-03-02 (×8): 200 mg via ORAL
  Filled 2020-02-23 (×8): qty 2

## 2020-02-23 MED ORDER — TOPIRAMATE 100 MG PO TABS
100.0000 mg | ORAL_TABLET | Freq: Every day | ORAL | Status: DC
  Administered 2020-02-24 – 2020-03-03 (×9): 100 mg via ORAL
  Filled 2020-02-23: qty 1
  Filled 2020-02-23: qty 4
  Filled 2020-02-23 (×7): qty 1

## 2020-02-23 MED ORDER — SODIUM CHLORIDE 0.9 % IV SOLN: INTRAVENOUS | Status: DC

## 2020-02-23 MED ORDER — SODIUM CHLORIDE 0.9 % IV BOLUS
1000.0000 mL | Freq: Once | INTRAVENOUS | Status: AC
  Administered 2020-02-23: 1000 mL via INTRAVENOUS

## 2020-02-23 MED ORDER — MORPHINE SULFATE (PF) 2 MG/ML IV SOLN
1.0000 mg | INTRAVENOUS | Status: DC | PRN
  Administered 2020-02-24 – 2020-02-25 (×4): 1 mg via INTRAVENOUS
  Filled 2020-02-23 (×4): qty 1

## 2020-02-23 MED ORDER — NORTRIPTYLINE HCL 25 MG PO CAPS
25.0000 mg | ORAL_CAPSULE | Freq: Every day | ORAL | Status: DC
  Administered 2020-02-24 – 2020-03-02 (×8): 25 mg via ORAL
  Filled 2020-02-23 (×10): qty 1

## 2020-02-23 MED ORDER — INSULIN ASPART 100 UNIT/ML ~~LOC~~ SOLN
0.0000 [IU] | Freq: Three times a day (TID) | SUBCUTANEOUS | Status: DC
  Administered 2020-02-24 (×2): 8 [IU] via SUBCUTANEOUS
  Administered 2020-02-25: 3 [IU] via SUBCUTANEOUS
  Administered 2020-02-25: 2 [IU] via SUBCUTANEOUS

## 2020-02-23 MED ORDER — ENOXAPARIN SODIUM 40 MG/0.4ML ~~LOC~~ SOLN
40.0000 mg | SUBCUTANEOUS | Status: DC
  Administered 2020-02-23 – 2020-03-02 (×9): 40 mg via SUBCUTANEOUS
  Filled 2020-02-23 (×10): qty 0.4

## 2020-02-23 MED ORDER — BISACODYL 5 MG PO TBEC
5.0000 mg | DELAYED_RELEASE_TABLET | Freq: Every day | ORAL | Status: DC | PRN
  Administered 2020-02-29 – 2020-03-02 (×3): 5 mg via ORAL
  Filled 2020-02-23 (×3): qty 1

## 2020-02-23 MED ORDER — TOPIRAMATE 25 MG PO TABS: 100.0000 mg | ORAL_TABLET | ORAL | Status: DC

## 2020-02-23 NOTE — Telephone Encounter (Signed)
Noted called and CX apt.

## 2020-02-23 NOTE — Telephone Encounter (Signed)
Again I updated Dr. Lucia Gaskins who advises they remain there for admission. Spoke with Monica [name] @ 3:02 pm and he informed me the pt was just taken back to a room in the ER. He verbalized appreciation for the call.

## 2020-02-23 NOTE — ED Notes (Signed)
The pt has been here since 0800  Today

## 2020-02-23 NOTE — ED Notes (Signed)
One attempt to start an iv unsuccessful   Pt requesting the iv team  Recent hospitalization  And had numerus  ivs and sticks.  bothe arma and hands very cold   Warm blankets given to the  The pt  After the iv stick she has been c/o pain in her entire rt side into her legs from the stick ???

## 2020-02-23 NOTE — Consult Note (Addendum)
Neurology Consultation  Reason for Consult: Progressive leg weakness-right greater than  left Referring Physician: Dr. Vanita Panda, EDP, Dr. Jaynee Eagles, outpatient neurologist.  CC: Progressive painful leg weakness  History is obtained from: patient and chart  HPI: Monica [name] is a 69 y.o. female PMH of uncontrolled diabetes, chronic pain, on multiple pain medications, PTSD, elevated liver enzymes, one episode of medication related seizure many years ago, occipital neuralgia, PTSD, presents to the emergency room upon recommendation of the outpatient neurologist Dr. Lavell Anchors for evaluation of progressive painful leg weakness. Patient reports that she has now for a while-about 6 to 7 months started having pain which is very severe radiating from her back into the leg and has caused leg weakness worse on the right and now starting to affect the left leg.  She reports that she has extremely painful weakness of the right leg to the point where she is now using a wheelchair to ambulate.  The pain is now also coming on to the left leg. She reports that she has also lost a lot of her muscle mass in her legs.  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  She has been on chronic pain treatment for chronic pain as well as under the treatment of PMR physician who had recommended her to Dr. Lavell Anchors for her recommendations. Dr. Lavell Anchors had evaluated the patient for severe back pain and lower extremity weakness.  She had complained of pain all over but more so on the right leg and right leg weakness that has since January progressed to completely disabling symptoms requiring wheelchair for ambulation. EMG nerve conduction studies were done: There is electrophysiological evidence of acute/ongoing right L3 radicular neuropathy and acute ongoing right L5 radiculopathy.  Additionally, there is electrophysiological evidence of acute ongoing left L5 radiculopathy.  There  is also concomitant sensorimotor polyneuropathy.  2 motor velocities in demyelinating range however there appeared to be a secondary to axonal degeneration in all her F waves are normal making demyelination disease much less likely however cannot rule out other disorders such as polyradiculoneuropathy. A CT myelogram was recommended which was done and showed question of nerve roots of cauda equina with minimal enlargement raising possibility of a neuropathic process-this was followed by an MRI of pelvis with and without contrast which did not show any concerning findings. Dr. Lavell Anchors followed her with an LP that showed glucose 183, protein 127 and a WBC count of 1 with no red cells.  The fluid was colorless.    She was sent to the emergency room because of continuing worsening of her symptoms progressively, now requiring wheelchair for ambulation.  Dr. Lavell Anchors had wanted to do IVIG but could not get the approvals fast enough.  She was given steroids while waiting for approval for IVIG by Dr. Anselm Pancoast as an outpatient.  She has had so much pain that she was admitted for AKI due to overuse of ibuprofen.  ROS: Review of systems performed and negative except as noted in HPI.  Past Medical History:  Diagnosis Date  . Arthritis   . Cervical facet joint syndrome 04/22/2012  . Cervicalgia   . Chronic headaches    Dr Tessa Lerner [place] [facility]  . Chronic nausea    normal GES   . Complication of anesthesia    had problem with local anesthesia-tried to assist Physician  . DM (diabetes mellitus) ([place])   . Elevated liver enzymes  hx of, normal 03/2010  . GERD (gastroesophageal reflux disease) 05/31/2011   History Schatzki's ring, erosive reflux esophagitis, status post last EGD and dilation 04/05/10   . Glaucoma    lens in both eyes  . Hemorrhoids 11/17/08   Colonoscopy Dr Laural Golden  . Occipital neuralgia   . PTSD (post-traumatic stress disorder)   . Renal lithiasis   . Seizure ([place])    12 yrs ago-med  related  . Torticollis   . Unspecified musculoskeletal disorders and symptoms referable to neck    cervical/trapezius    Family History  Problem Relation Age of Onset  . Colon cancer Mother 65  . Heart disease Father   . Diabetes Father   . Diabetes Paternal Grandmother   . Heart disease Paternal Grandfather   . Diverticulitis Daughter     Social History:   reports that she has never smoked. She has never used smokeless tobacco. She reports that she does not drink alcohol and does not use drugs.  Medications No current facility-administered medications for this encounter.  Current Outpatient Medications:  .  ALPRAZolam (XANAX) 0.25 MG tablet, Take 1-2 tabs (0.1m-0.50mg) 30-60 minutes before procedure. May repeat if needed.Do not drive., Disp: 4 tablet, Rfl: 0 .  atorvastatin (LIPITOR) 10 MG tablet, Take 1 tablet (10 mg total) by mouth daily., Disp: 30 tablet, Rfl: 2 .  blood glucose meter kit and supplies KIT, Dispense based on patient and insurance preference. Use up to four times daily as directed. (FOR ICD-9 250.00, 250.01)., Disp: 1 each, Rfl: 0 .  dapagliflozin propanediol (FARXIGA) 10 MG TABS tablet, Take 1 tablet (10 mg total) by mouth daily before breakfast., Disp: 30 tablet, Rfl: 2 .  magnesium oxide (MAG-OX) 400 (241.3 Mg) MG tablet, Take 1 tablet (400 mg total) by mouth 2 (two) times daily., Disp: 60 tablet, Rfl: 2 .  metFORMIN (GLUCOPHAGE) 500 MG tablet, Take 2 tablets (1,000 mg total) by mouth 2 (two) times daily., Disp: 360 tablet, Rfl: 1 .  methadone (DOLOPHINE) 10 MG tablet, Take 2 tablets (20 mg total) by mouth 3 (three) times daily., Disp: 180 tablet, Rfl: 0 .  nortriptyline (PAMELOR) 25 MG capsule, TAKE 1 CAPSULE BY MOUTH AT BEDTIME., Disp: 30 capsule, Rfl: 0 .  ondansetron (ZOFRAN) 4 MG tablet, Take 1 tablet (4 mg total) by mouth every 8 (eight) hours as needed for nausea or vomiting., Disp: 4 tablet, Rfl: 0 .  predniSONE (DELTASONE) 20 MG tablet, Take 3 tablets  (60 mg total) by mouth daily with breakfast. First dose may be taken at anytime, Disp: 15 tablet, Rfl: 0 .  tapentadol (NUCYNTA) 50 MG tablet, Take 1 tablet (50 mg total) by mouth every 12 (twelve) hours as needed., Disp: 60 tablet, Rfl: 0 .  tapentadol (NUCYNTA) 50 MG tablet, Take 1 tablet (50 mg total) by mouth every 12 (twelve) hours as needed., Disp: 60 tablet, Rfl: 0 .  tiZANidine (ZANAFLEX) 2 MG tablet, TAKE (1) TABLET EVERY SIX HOURS AS NEEDED FOR MUSCLE SPASMS. (Patient taking differently: Take 2 mg by mouth every 6 (six) hours as needed for muscle spasms. ), Disp: 60 tablet, Rfl: 1 .  topiramate (TOPAMAX) 50 MG tablet, TAKE 2 TABLETS IN THE MORNING AND 4 TABLETS AT BEDTIME. (Patient taking differently: Take 100-200 mg by mouth See admin instructions. TAKE 2 TABLETS IN THE MORNING AND 4 TABLETS AT BEDTIME.), Disp: 180 tablet, Rfl: 0   Exam: Current vital signs: BP 119/71 (BP Location: Left Arm)   Pulse 100  Temp 98.2 F (36.8 C)   Resp 15   Ht _0  (1.626 m)   Wt 61.7 kg   SpO2 97%   BMI 23.34 kg/m  Vital signs in last 24 hours: Temp:  [97.9 F (36.6 C)-99.3 F (37.4 C)] 98.2 F (36.8 C) (07/14 1728) Pulse Rate:  [100-130] 100 (07/14 1628) Resp:  [12-18] 15 (07/14 1628) BP: (99-130)/(50-83) 119/71 (07/14 1628) SpO2:  [97 %-100 %] 97 % (07/14 1628) Weight:  [61.7 kg] 61.7 kg (07/14 0915) General: Awake alert oriented x3 HEENT: Cephalic atraumatic CVS: Regular rate rhythm Chest: Clear Abdomen: Nondistended nontender Neurological exam She is awake alert oriented x3 There is no dysarthria No evidence of aphasia Cranial nerves: Pupils equal round react light, extraocular movements intact, visual fields full, face symmetric, facial sensation intact, tongue and palate midline. Motor exam: Bulk of muscles in her lower extremities is reduced bilaterally most prominently in the thighs.  Her right thigh is extremely tender to touch and precluded much of the exam.  She could only  give me at most 1/5 hip flexion on the right.  Also 1-2/5 knee flexion and extension on the right as well as 1-2/5 plantar and dorsiflexion on the right.  On the left hip, she is 4+/5.  Left knee flexion is also 4+/5.  Left plantar and dorsiflexors are 4 -/5. Sensory exam: There is a significant diminished sensation in a stocking pattern up to the upper third of the thigh bilaterally along with absent joint position sense in both toes. Deep tendon reflexes: Would not let me tap the right knee much but I could not elicit bilateral knee jerks.  I could not elicit bilateral ankle jerks.  I could not elicit bilateral biceps or triceps jerks.  I could not elicit brachioradialis jerks bilaterally. Gait testing was deferred at this time  Labs I have reviewed labs in epic and the results pertinent to this consultation are:  CBC    Component Value Date/Time   WBC 11.9 (H) 02/23/2020 0944   RBC 4.57 02/23/2020 0944   HGB 13.6 02/23/2020 0944   HGB 9.8 (L) 01/11/2020 1134   HCT 43.7 02/23/2020 0944   HCT 29.3 (L) 01/11/2020 1134   PLT 234 02/23/2020 0944   PLT 208 01/11/2020 1134   MCV 95.6 02/23/2020 0944   MCV 89 01/11/2020 1134   MCH 29.8 02/23/2020 0944   MCHC 31.1 02/23/2020 0944   RDW 14.3 02/23/2020 0944   RDW 14.2 01/11/2020 1134   LYMPHSABS 1.0 01/11/2020 1134   MONOABS 0.3 05/04/2012 1109   EOSABS 0.2 01/11/2020 1134   BASOSABS 0.1 01/11/2020 1134    CMP     Component Value Date/Time   NA 137 02/23/2020 0944   NA 139 01/11/2020 1134   K 4.4 02/23/2020 0944   CL 99 02/23/2020 0944   CO2 21 (L) 02/23/2020 0944   GLUCOSE 325 (H) 02/23/2020 0944   BUN 30 (H) 02/23/2020 0944   BUN 13 01/11/2020 1134   CREATININE 0.94 02/23/2020 0944   CREATININE 1.09 05/04/2012 1109   CALCIUM 10.1 02/23/2020 0944   PROT 7.5 02/23/2020 0944   PROT 6.9 10/22/2019 1012   ALBUMIN 4.0 02/23/2020 0944   ALBUMIN 4.3 10/22/2019 1012   AST 18 02/23/2020 0944   ALT 22 02/23/2020 0944   ALKPHOS 144  (H) 02/23/2020 0944   BILITOT 0.5 02/23/2020 0944   BILITOT <0.2 10/22/2019 1012   GFRNONAA >60 02/23/2020 0944   GFRAA >60 02/23/2020 6333  Imaging I have reviewed the images obtained:  MRI lumbar spine March 2021-without contrast-severe facet degeneration at L4-5 greater on the right and L5-S1 greater on the left levels.  No significant lumbar spinal stenosis.  Mild left lateral recess stenosis at L5-S1.  Moderate left L2 foraminal stenosis.  MRI pelvis with and without contrast-no acute findings.  Mild degenerative postsurgical changes within the lumbar spine associated with convex left scoliosis.  No evidence of sacral nerve root enhancement or encroachment.  Mild muscular asymmetries.  C-spine from May 2021-postop changes at C5 and C7 levels without evidence of complication.  No high-grade canal stenosis on CT.  CT myelogram July 2021-mild curvature convex to the left 2 mm chronic anterior listhesis L4-5.  Previous posterior decompression and discectomy at L4-5.  No compressive stenosis.  Bilateral facet arthritis.  L5-S1 facet osteoarthritis with 2 mm anterolisthesis.  No compressive stenosis.  Mild noncompressive disc bulges at L2-3 and L3-4.  Question if the nerve roots of the cauda equina are minimally larger than normal some measuring up to 1 to 1.2 mm in diameter raising the possibility of some sort of neuropathic process.  This is a potential finding and not confirmed.  Assessment:  69 year old with uncontrolled diabetes, chronic pain, multiple back surgeries, PTSD, presenting for evaluation of progressive painful weakness of legs-right worse than left that has been going on since January 2021 or maybe even since November 2020 when she started having some hip and back pain. Her electrodiagnostic studies are suggestive of lumbosacral polyradiculoneuropathy. It is hard to interpret her CSF findings-elevated protein at 123 with no cells in the setting of hyperglycemia and uncontrolled  diabetes-could simply be because of the diabetes or could be secondary to the inflammatory polyradiculoneuropathy process. Given her uncontrolled diabetes and the painful progression of her weakness which is asymmetric, diabetic amyotrophy also has to be kept in the differentials and I would keep that pretty high. In either case IVIG is the only thing that could  help but she needs a little bit of further work-up for other sensorimotor neuropathies prior to starting any immune therapy.  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 Other differentials to consider are nondiabetes related sensorimotor neuropathies.  Recommendations: I would check the following labs. -CBC -BMP -Urinalysis -B12 -Thiamine levels -B6 levels -Angiotensin-converting enzyme -Copper -SSA SSB antibodies -Antinuclear antibodies -ANCA -Copper levels in the serum -Heavy metal screen -A1c  -Pain management per medicine -PT OT -Pt has not had a contrast-enhanced scan of the lumbar spine-the scan that was pelvis MRI with and without contrast was actually only a without contrast study as the patient could not tolerate the full study and contrast could not be administered.  -I would recommend getting MRI L-spine with contrast.  I will discuss with her tomorrow. -I will also keep her hydrated well with IV fluids. -I will reexamine her tomorrow, check on the labs that are back by then, and then make a decision to start IVIG, which I most likely will be starting but I want to make sure I discussed this with the outpatient neurologist for a second opinion.  Relayed my plan to the ED provider.  Please call with questions.  -- Amie Portland, MD [facility] Pager: [phone] If 7pm to 7am, please call on call as listed on AMION.

## 2020-02-23 NOTE — ED Provider Notes (Signed)
[facility] EMERGENCY DEPARTMENT Provider Note   CSN: [id number] Arrival date & time: 02/23/20  0900     History Chief Complaint  Patient presents with  . Extremity Weakness    Monica [name] is a 69 y.o. female presents to ER for IVIG infusion as directed by Dr Jaynee Eagles Neurology.  She reports gradual onset right leg pain and weakness 6-7 months ago.  Now she cannot use her right leg due to pain and weakness, "it doesn't support me".  States this has now started to happen in the left leg as well.  States everything below her waistline is painful.  Feels like her waist line and her pants are up under her rib cage. She denies frank chest pain, shortness of breath.  Has been followed by neurology who has done MRI, CT and lumbar puncture. They were called today and told to come to ER for IVIG infusions because they could not arrange it as outpatient.  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 Of note patient found to be tachycardic in ER HR 120s.  Husband states she was told she has "arrhythmia" but they don't know what type.  She denies recent illnesses. Denies fever, cough, vomiting, diarrhea, dysuria.   HPI     Past Medical History:  Diagnosis Date  . Arthritis   . Cervical facet joint syndrome 04/22/2012  . Cervicalgia   . Chronic headaches    Dr Tessa Lerner [place] [facility]  . Chronic nausea    normal GES   . Complication of anesthesia    had problem with local anesthesia-tried to assist Physician  . DM (diabetes mellitus) ([place])   . Elevated liver enzymes    hx of, normal 03/2010  . GERD (gastroesophageal reflux disease) 05/31/2011   History Schatzki's ring, erosive reflux esophagitis, status post last EGD and dilation 04/05/10   . Glaucoma    lens in both eyes  . Hemorrhoids 11/17/08   Colonoscopy Dr Laural Golden  . Occipital neuralgia   . PTSD (post-traumatic stress disorder)   . Renal lithiasis   . Seizure ([place])    12 yrs ago-med related  . Torticollis   . Unspecified  musculoskeletal disorders and symptoms referable to neck    cervical/trapezius    Patient Active Problem List   Diagnosis Date Noted  . Renal failure 12/30/2019  . Lower extremity weakness 10/19/2019  . Arthritis of right sacroiliac joint 08/18/2019  . Altered bowel habits 08/03/2019  . Trochanteric bursitis, left hip 07/20/2019  . Uncontrolled type 2 diabetes mellitus with hyperglycemia, without long-term current use of insulin ([place]) 06/17/2019  . PTSD (post-traumatic stress disorder) 10/15/2016  . Occipital neuralgia (Location of Primary Source of Pain) (Bilateral) (L>R) 10/02/2015  . Chronic cervical radicular pain (Left) 08/31/2015  . 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 Cervical facet arthropathy (severe at C3-4) (Bilateral) (L>R) 07/26/2015  . Cervical foraminal stenosis (C3-4) (Left) 07/26/2015  . Cervical spondylosis 06/13/2015  . Cervical facet syndrome (Location of Secondary source of pain) (Bilateral) (L>R) 06/13/2015  . Chronic pain 06/13/2015  . Cervicogenic headache (Location of Primary Source of Pain) (Bilateral) (L>R) 06/13/2015  . Chronic neck pain (Location of Secondary source of pain) (Bilateral) (L>R) 06/13/2015  . Long term current use of opiate analgesic 06/13/2015  . Opiate use (600 MME/Day) 06/13/2015  . Opiate dependence ([place]) 06/13/2015  . Myofascial muscle pain 04/19/2013  . Dyspnea 02/10/2013  . Intractable chronic migraine without aura and with status migrainosus 04/22/2012  .  Depression 04/22/2012  . Borderline personality disorder ([place]) 04/22/2012  . GERD with esophagitis 05/31/2011    Past Surgical History:  Procedure Laterality Date  . BREAST LUMPECTOMY     left  . CATARACT EXTRACTION W/PHACO Left 10/07/2016   Procedure: CATARACT EXTRACTION PHACO AND INTRAOCULAR LENS PLACEMENT (IOC);  Surgeon: Tonny Branch, MD;  Location: [facility];  Service: Ophthalmology;  Laterality: Left;  CDE: 4.98  . CATARACT EXTRACTION W/PHACO Right  10/21/2016   Procedure: CATARACT EXTRACTION PHACO AND INTRAOCULAR LENS PLACEMENT RIGHT EYE CDE - 6.24;  Surgeon: Tonny Branch, MD;  Location: [facility];  Service: Ophthalmology;  Laterality: Right;  right  . CHOLECYSTECTOMY    . COLONOSCOPY  11/17/08   ext hemorrhoids  . CRANIOTOMY     secondary TBI  . CYST EXCISION     left wrist  . ESOPHAGOGASTRODUODENOSCOPY  04/05/10   [name] (41F),erosive reflux esophagitis, small hiatal hernia,  . ESOPHAGOGASTRODUODENOSCOPY (EGD) WITH PROPOFOL N/A 08/30/2019   Procedure: ESOPHAGOGASTRODUODENOSCOPY (EGD) WITH PROPOFOL;  Surgeon: Daneil Dolin, MD;  Location: [facility];  Service: Endoscopy;  Laterality: N/A;  3:30pm - pt knows to arrive at 9:45  . EXTRACORPOREAL SHOCK WAVE LITHOTRIPSY Left 12/13/2019   Procedure: EXTRACORPOREAL SHOCK WAVE LITHOTRIPSY (ESWL);  Surgeon: Festus Aloe, MD;  [facility];  Service: Urology;  Laterality: Left;  . FINGER SURGERY     removal cyst left pinky  . gastro empy study  03/03/10   normal  . HAND SURGERY    . MALONEY DILATION N/A 08/30/2019   Procedure: Venia Minks DILATION;  Surgeon: Daneil Dolin, MD;  Location: [facility];  Service: Endoscopy;  Laterality: N/A;  . NECK SURGERY  1998  . NEPHROLITHOTOMY  05/13/2012   Procedure: NEPHROLITHOTOMY PERCUTANEOUS;  Surgeon: Franchot Gallo, MD;  Location: [facility];  Service: Urology;  Laterality: Left;        OB History   No obstetric history on file.     Family History  Problem Relation Age of Onset  . Colon cancer Mother 30  . Heart disease Father   . Diabetes Father   . Diabetes Paternal Grandmother   . Heart disease Paternal Grandfather   . Diverticulitis Daughter     Social History   Tobacco Use  . Smoking status: Never Smoker  . Smokeless tobacco: Never Used  Vaping Use  . Vaping Use: Never used  Substance Use Topics  . Alcohol use: No  . Drug use: No    Home Medications Prior to Admission medications     Medication Sig Start Date End Date Taking? Authorizing Provider  ALPRAZolam (XANAX) 0.25 MG tablet Take 1-2 tabs (0.'25mg'$ -0.'50mg'$ ) 30-60 minutes before procedure. May repeat if needed.Do not drive. 02/17/20   Melvenia Beam, MD  atorvastatin (LIPITOR) 10 MG tablet Take 1 tablet (10 mg total) by mouth daily. 01/25/20   Loman Brooklyn, FNP  blood glucose meter kit and supplies KIT Dispense based on patient and insurance preference. Use up to four times daily as directed. (FOR ICD-9 250.00, 250.01). 11/18/19   Loman Brooklyn, FNP  dapagliflozin propanediol (FARXIGA) 10 MG TABS tablet Take 1 tablet (10 mg total) by mouth daily before breakfast. 02/02/20   Loman Brooklyn, FNP  magnesium oxide (MAG-OX) 400 (241.3 Mg) MG tablet Take 1 tablet (400 mg total) by mouth 2 (two) times daily. 01/11/20   Loman Brooklyn, FNP  metFORMIN (GLUCOPHAGE) 500 MG tablet Take 2 tablets (1,000 mg total) by mouth 2 (  two) times daily. 02/02/20   Loman Brooklyn, FNP  methadone (DOLOPHINE) 10 MG tablet Take 2 tablets (20 mg total) by mouth 3 (three) times daily. 02/16/20   Meredith Staggers, MD  nortriptyline (PAMELOR) 25 MG capsule TAKE 1 CAPSULE BY MOUTH AT BEDTIME. 02/18/20   Meredith Staggers, MD  ondansetron (ZOFRAN) 4 MG tablet Take 1 tablet (4 mg total) by mouth every 8 (eight) hours as needed for nausea or vomiting. 12/07/19   Triplett, Tammy, PA-C  predniSONE (DELTASONE) 20 MG tablet Take 3 tablets (60 mg total) by mouth daily with breakfast. First dose may be taken at anytime 02/17/20   Melvenia Beam, MD  tapentadol (NUCYNTA) 50 MG tablet Take 1 tablet (50 mg total) by mouth every 12 (twelve) hours as needed. 02/16/20   Meredith Staggers, MD  tapentadol (NUCYNTA) 50 MG tablet Take 1 tablet (50 mg total) by mouth every 12 (twelve) hours as needed. 02/16/20   Meredith Staggers, MD  tiZANidine (ZANAFLEX) 2 MG tablet TAKE (1) TABLET EVERY SIX HOURS AS NEEDED FOR MUSCLE SPASMS. Patient taking differently: Take 2 mg by mouth every 6  (six) hours as needed for muscle spasms.  11/25/19   Meredith Staggers, MD  topiramate (TOPAMAX) 50 MG tablet TAKE 2 TABLETS IN THE MORNING AND 4 TABLETS AT BEDTIME. Patient taking differently: Take 100-200 mg by mouth See admin instructions. TAKE 2 TABLETS IN THE MORNING AND 4 TABLETS AT BEDTIME. 02/18/20   Meredith Staggers, MD    Allergies    Aspirin, Hydromorphone hcl, Vimpat [lacosamide], Capsaicin, Depakote [divalproex sodium], Milnacipran hcl, Pristiq [desvenlafaxine succinate monohydrate], and Gabapentin  Review of Systems   Review of Systems  Musculoskeletal: Positive for arthralgias, gait problem and myalgias.  Neurological: Positive for weakness (bilateral).  All other systems reviewed and are negative.   Physical Exam Updated Vital Signs BP 119/71 (BP Location: Left Arm)   Pulse 100   Temp 98.2 F (36.8 C)   Resp 15   Ht '5\' 4"'$  (1.626 m)   Wt 61.7 kg   SpO2 97%   BMI 23.34 kg/m   Physical Exam Vitals and nursing note reviewed.  Constitutional:      Appearance: She is well-developed.     Comments: Non toxic in NAD.  Appears older than stated age  HENT:     Head: Normocephalic and atraumatic.     Nose: Nose normal.  Eyes:     Conjunctiva/sclera: Conjunctivae normal.  Cardiovascular:     Rate and Rhythm: Tachycardia present.  Pulmonary:     Effort: Pulmonary effort is normal.     Breath sounds: Normal breath sounds.  Abdominal:     General: Bowel sounds are normal.     Palpations: Abdomen is soft.     Tenderness: There is no abdominal tenderness.     Comments: No G/R/R. No suprapubic or CVA tenderness. Negative Murphy's and McBurney's. Active BS to lower quadrants.   Musculoskeletal:        General: Tenderness present. Normal range of motion.     Cervical back: Normal range of motion.     Comments: Diffuse tenderness along anterior/posterior bilateral lower extremities in the muscle compartments as well as joints. No focal edema, erythema, fluctuance of the  lower extremity joints.  Skin:    General: Skin is warm and dry.     Capillary Refill: Capillary refill takes less than 2 seconds.  Neurological:     Mental Status: She is alert.  Psychiatric:        Behavior: Behavior normal.     ED Results / Procedures / Treatments   Labs (all labs ordered are listed, but only abnormal results are displayed) Labs Reviewed  CBC - Abnormal; Notable for the following components:      Result Value   WBC 11.9 (*)    All other components within normal limits  COMPREHENSIVE METABOLIC PANEL - Abnormal; Notable for the following components:   CO2 21 (*)    Glucose, Bld 325 (*)    BUN 30 (*)    Alkaline Phosphatase 144 (*)    Anion gap 17 (*)    All other components within normal limits  SARS CORONAVIRUS 2 BY RT PCR (HOSPITAL ORDER, [facility])  PROTIME-INR  URINALYSIS, ROUTINE W REFLEX MICROSCOPIC    EKG None  Radiology No results found.  Procedures Procedures (including critical care time)  Medications Ordered in ED Medications  sodium chloride 0.9 % bolus 1,000 mL (1,000 mLs Intravenous New Bag/Given 02/23/20 1726)    ED Course  I have reviewed the triage vital signs and the nursing notes.  Pertinent labs & imaging results that were available during my care of the patient were reviewed by me and considered in my medical decision making (see chart for details).  Clinical Course as of Feb 23 1808  Wed Feb 23, 2020  1517 Glucose(!): 325 [CG]  1525 CO2(!): 21 [CG]  1525 Anion gap(!): 17 [CG]  1525 WBC(!): 11.9 [CG]  1525 Pulse Rate(!): 120 [CG]  1727 Pulse Rate: 100 [CG]  1727 Temp: 99.1 F (37.3 C) [CG]    Clinical Course User Index [CG] Kinnie Feil, PA-C   MDM Rules/Calculators/A&P                           I obtained additional history from triage, nursing notes and review of medical chart.  Previous medical records available, nursing notes reviewed to obtain more history and assist with  MDM. In summary, seen by neurology Dr Jaynee Eagles and referred to ED for IVIG infusion and admission.  She had MRI, NVC with EMG and lumbar puncture. 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  Neurology recommends admission for IVIG. Dr Rory Percy was notified of patient's referral.    Laboratory studies and imaging ordered in triage by triage RN.  I have personally visualized and interpreted these.    ER work up reveals hyperglycemia with AG 17, CO 21.  Mild leukocytosis in setting of recent steroid use.  H/o diabetes. She is tachycardic here HR 120s, regular.  Denies infection symptoms, palpitations, chest pain, shortness of breath.    I ordered medications IVF, repeat CBG.    I ordered repeat CBG, EKG, continuous cardiac monitoring and pulse ox.    Neurology consulted.   1808: Spoke with Dr Rory Percy, will put appropriate orders in. Agrees with admission.  Requesting UA.    Patient re-evaluated and no clinical decline. Tachycardia has resolved after IVF. Given HR 120s in triage, requested RN for rectal temp. UA pending. No respiratory or GI symptoms.  Final Clinical Impression(s) / ED Diagnoses Final diagnoses:  Leg weakness, bilateral    Rx / DC Orders ED Discharge Orders    None       Arlean Hopping 02/23/20 1809    Carmin Muskrat, MD 02/23/20 2353

## 2020-02-23 NOTE — Telephone Encounter (Signed)
Spoke with husband. They are still there waiting. He is in the car and patient is in the waiting room. I advised him per Dr. Lucia Gaskins to please stay there as pt does need to be admitted and even waiting and getting admitted is recommended over waiting for the IVIG at home as she will need therapy as well. He verbalized understanding.

## 2020-02-23 NOTE — ED Notes (Addendum)
Dr. Charm Barges made aware of patient advised this RN to get CBC, CMP and PT at this time.

## 2020-02-23 NOTE — Telephone Encounter (Signed)
Husband has called to inform pt is now on hour 6 of waiting to be seen at the ED.  Husband is asking if there is another hospital pt can go to. He is asking for a call re: what other options are available for pt.

## 2020-02-23 NOTE — ED Triage Notes (Signed)
Pt sent from [facility] after diagnosis of CIPD to come in and receive IVIG, Dr. Wilford Corner, Neurologist made aware that patient is coming to ED for this. Pt states that she has had progressive weakness in R leg that has not progressed to Left leg x7 months.

## 2020-02-23 NOTE — H&P (Signed)
History and Physical    Monica [name] QJF:[id number] DOB: June 09, 1951 DOA: 02/23/2020  PCP: Monica Brooklyn, FNP  Patient coming from: Home  I have personally briefly reviewed patient's old medical records in [place]  Chief Complaint: Worsening bilateral lower leg pain  HPI: [name] is a 69 y.o. female with medical history significant for chronic migraine, type 2 diabetes, chronic pain on methadone, PTSD who presents with concerns of worsening lower leg pain.  Patient has been having bilateral lower leg pain for the past 8 months and was referred to neurology by her PMR physician. She has had extensive work-up including an MRI of the lumbar spine, EMG and an LP. EMG noted to have right L3 radiculopathy and right L5 radiculopathy.  LP noted to have few cells but significantly elevated CSF protein consistent with inflammatory polyradiculoneuropathy and IVIG was recommended. She was recently given a week of steroids at the beginning of July.  For the past 2 to 3 weeks her pain has acutely worsened and she has not been able to walk and now has been wheelchair-bound. She describes the pain as being sharp, electric-like with numbness and tingling of her bilateral feet.  It is worse when she sits down.   Her left leg is usually better than her right but recently it also started to have worsening symptoms.  She also feels as if her waistline is actually up at her lower ribs.  Also has been having decreased bowel movement since symptoms started 8 months ago and has only been able to have 1 bowel movement per month.  Her last bowel movement was 2 days ago after taking Dulcolax.  Denies any issues with urine output.  Denies any abdominal pain.  No nausea or vomiting.  She was sent to ED by outpatient neurology for IVIG due to progressively worsening symptoms.   ED Course: She was afebrile, normotensive and mildly tachycardic up to 130.  She has mild leukocytosis of 11.9 although recently  was on prednisone.  Glucose of 325 with anion gap of 17, CO2 of 21.  Review of Systems: Constitutional: No Weight Change, No Fever ENT/Mouth: No sore throat, No Rhinorrhea Eyes: No Eye Pain, No Vision Changes Cardiovascular: No Chest Pain, no SOB Respiratory: No Cough, No Sputum Gastrointestinal: No Nausea, No Vomiting, No Diarrhea, No Constipation, No Pain Genitourinary: no Urinary Incontinence Musculoskeletal: No Arthralgias, No Myalgias Skin: No Skin Lesions, No Pruritus, Neuro: + Weakness, + Numbness,  No Loss of Consciousness, No Syncope Psych: No Anxiety/Panic, No Depression, no decrease appetite Heme/Lymph: No Bruising, No Bleeding  Past Medical History:  Diagnosis Date  . Arthritis   . Cervical facet joint syndrome 04/22/2012  . Cervicalgia   . Chronic headaches    Dr Tessa Lerner [place] [facility]  . Chronic nausea    normal GES   . Complication of anesthesia    had problem with local anesthesia-tried to assist Physician  . DM (diabetes mellitus) ([place])   . Elevated liver enzymes    hx of, normal 03/2010  . GERD (gastroesophageal reflux disease) 05/31/2011   History Schatzki's ring, erosive reflux esophagitis, status post last EGD and dilation 04/05/10   . Glaucoma    lens in both eyes  . Hemorrhoids 11/17/08   Colonoscopy Dr Laural Golden  . Occipital neuralgia   . PTSD (post-traumatic stress disorder)   . Renal lithiasis   . Seizure ([place])    12 yrs ago-med related  . Torticollis   . Unspecified  musculoskeletal disorders and symptoms referable to neck    cervical/trapezius    Past Surgical History:  Procedure Laterality Date  . BREAST LUMPECTOMY     left  . CATARACT EXTRACTION W/PHACO Left 10/07/2016   Procedure: CATARACT EXTRACTION PHACO AND INTRAOCULAR LENS PLACEMENT (IOC);  Surgeon: Tonny Branch, MD;  Location: [facility];  Service: Ophthalmology;  Laterality: Left;  CDE: 4.98  . CATARACT EXTRACTION W/PHACO Right 10/21/2016   Procedure: CATARACT EXTRACTION PHACO AND  INTRAOCULAR LENS PLACEMENT RIGHT EYE CDE - 6.24;  Surgeon: Tonny Branch, MD;  Location: [facility];  Service: Ophthalmology;  Laterality: Right;  right  . CHOLECYSTECTOMY    . COLONOSCOPY  11/17/08   ext hemorrhoids  . CRANIOTOMY     secondary TBI  . CYST EXCISION     left wrist  . ESOPHAGOGASTRODUODENOSCOPY  04/05/10   [name] (29F),erosive reflux esophagitis, small hiatal hernia,  . ESOPHAGOGASTRODUODENOSCOPY (EGD) WITH PROPOFOL N/A 08/30/2019   Procedure: ESOPHAGOGASTRODUODENOSCOPY (EGD) WITH PROPOFOL;  Surgeon: Daneil Dolin, MD;  Location: [facility];  Service: Endoscopy;  Laterality: N/A;  3:30pm - pt knows to arrive at 9:45  . EXTRACORPOREAL SHOCK WAVE LITHOTRIPSY Left 12/13/2019   Procedure: EXTRACORPOREAL SHOCK WAVE LITHOTRIPSY (ESWL);  Surgeon: Festus Aloe, MD;  [facility];  Service: Urology;  Laterality: Left;  . FINGER SURGERY     removal cyst left pinky  . gastro empy study  03/03/10   normal  . HAND SURGERY    . MALONEY DILATION N/A 08/30/2019   Procedure: Venia Minks DILATION;  Surgeon: Daneil Dolin, MD;  Location: [facility];  Service: Endoscopy;  Laterality: N/A;  . NECK SURGERY  1998  . NEPHROLITHOTOMY  05/13/2012   Procedure: NEPHROLITHOTOMY PERCUTANEOUS;  Surgeon: Franchot Gallo, MD;  Location: [facility];  Service: Urology;  Laterality: Left;        reports that she has never smoked. She has never used smokeless tobacco. She reports that she does not drink alcohol and does not use drugs.  Allergies  Allergen Reactions  . Aspirin Anaphylaxis and Hives    Slurred speech, blurred vision- also  . Hydromorphone Hcl Shortness Of Breath and Other (See Comments)    Severe shortness of breath  . Vimpat [Lacosamide] Other (See Comments)    Hallucinations, out of body experience, confusion   . Depakote [Divalproex Sodium] Other (See Comments)    Reaction not recalled by husband  . Milnacipran Hcl Other (See Comments)    Dizziness    . Pristiq [Desvenlafaxine Succinate Monohydrate] Other (See Comments)    Dizziness   . Capsaicin Rash and Other (See Comments)    Red rash  . Gabapentin Other (See Comments)    States leg pain worsened, pt d/c'd    Family History  Problem Relation Age of Onset  . Colon cancer Mother 62  . Heart disease Father   . Diabetes Father   . Diabetes Paternal Grandmother   . Heart disease Paternal Grandfather   . Diverticulitis Daughter      Prior to Admission medications   Medication Sig Start Date End Date Taking? Authorizing Provider  bisacodyl (BISACODYL) 5 MG EC tablet Take 5 mg by mouth daily as needed for mild constipation or moderate constipation.    Yes [provider]  dapagliflozin propanediol (FARXIGA) 10 MG TABS tablet Take 1 tablet (10 mg total) by mouth daily before breakfast. 02/02/20  Yes [name] F, FNP  magnesium oxide (MAG-OX) 400 (241.3 Mg) MG tablet  Take 1 tablet (400 mg total) by mouth 2 (two) times daily. 01/11/20  Yes [name] F, FNP  metFORMIN (GLUCOPHAGE) 500 MG tablet Take 2 tablets (1,000 mg total) by mouth 2 (two) times daily. 02/02/20  Yes [name] F, FNP  methadone (DOLOPHINE) 10 MG tablet Take 2 tablets (20 mg total) by mouth 3 (three) times daily. 02/16/20  Yes Meredith Staggers, MD  nortriptyline (PAMELOR) 25 MG capsule TAKE 1 CAPSULE BY MOUTH AT BEDTIME. Patient taking differently: Take 25 mg by mouth at bedtime.  02/18/20  Yes Meredith Staggers, MD  ondansetron (ZOFRAN) 4 MG tablet Take 1 tablet (4 mg total) by mouth every 8 (eight) hours as needed for nausea or vomiting. 12/07/19  Yes Triplett, Tammy, PA-C  tapentadol (NUCYNTA) 50 MG tablet Take 1 tablet (50 mg total) by mouth every 12 (twelve) hours as needed. Patient taking differently: Take 50 mg by mouth every 12 (twelve) hours as needed (for breakthrough migraines).  02/16/20  Yes Meredith Staggers, MD  tiZANidine (ZANAFLEX) 2 MG tablet TAKE (1) TABLET EVERY SIX HOURS AS NEEDED FOR  MUSCLE SPASMS. Patient taking differently: Take 2 mg by mouth every 6 (six) hours as needed for muscle spasms.  11/25/19  Yes Meredith Staggers, MD  topiramate (TOPAMAX) 50 MG tablet TAKE 2 TABLETS IN THE MORNING AND 4 TABLETS AT BEDTIME. Patient taking differently: Take 100-200 mg by mouth See admin instructions. Take 100 mg by mouth in the morning and 200 mg at bedtime 02/18/20  Yes Meredith Staggers, MD  ALPRAZolam Duanne Moron) 0.25 MG tablet Take 1-2 tabs (0.3m-0.50mg) 30-60 minutes before procedure. May repeat if needed.Do not drive. Patient not taking: Reported on 02/23/2020 02/17/20   AMelvenia Beam MD  atorvastatin (LIPITOR) 10 MG tablet Take 1 tablet (10 mg total) by mouth daily. Patient not taking: Reported on 02/23/2020 01/25/20   JLoman Brooklyn FNP  blood glucose meter kit and supplies KIT Dispense based on patient and insurance preference. Use up to four times daily as directed. (FOR ICD-9 250.00, 250.01). 11/18/19   JLoman Brooklyn FNP  predniSONE (DELTASONE) 20 MG tablet Take 3 tablets (60 mg total) by mouth daily with breakfast. First dose may be taken at anytime Patient not taking: Reported on 02/23/2020 02/17/20   AMelvenia Beam MD    Physical Exam: Vitals:   02/23/20 1302 02/23/20 1614 02/23/20 1628 02/23/20 1728  BP: 99/70 (!) 102/50 119/71   Pulse: (!) 120  100   Resp: 15  15   Temp:   99.1 F (37.3 C) 98.2 F (36.8 C)  TempSrc:   Oral   SpO2: 97%  97%   Weight:      Height:        Constitutional: NAD, calm, comfortable, elderly female sitting at 30 incline in bed Vitals:   02/23/20 1302 02/23/20 1614 02/23/20 1628 02/23/20 1728  BP: 99/70 (!) 102/50 119/71   Pulse: (!) 120  100   Resp: 15  15   Temp:   99.1 F (37.3 C) 98.2 F (36.8 C)  TempSrc:   Oral   SpO2: 97%  97%   Weight:      Height:       Eyes: PERRL, lids and conjunctivae normal ENMT: Mucous membranes are moist.  Neck: normal, supple Respiratory: clear to auscultation bilaterally, no wheezing,  no crackles. Normal respiratory effort. No accessory muscle use.  Cardiovascular: Regular rate and rhythm, no murmurs / rubs / gallops. No extremity  edema. 2+ pedal pulses.  Abdomen: no tenderness, no masses palpated.  Bowel sounds positive.  Musculoskeletal: no clubbing / cyanosis. No joint deformity upper and lower extremities. Good ROM, no contractures.  Muscle racing around bilateral anterior thigh. Skin: no rashes, lesions, ulcers. No induration Neurologic: CN 2-12 grossly intact.  Decreased sensation of the distal right lower extremity compared to the left.  Patient able to actively lift left lower extremity with 3 out of 5 strength but unable to actively lift right lower extremity.  Patient endorse pain and increased vibration senses following palpation of the lower extremity. Unable to elicit patella reflexes Psychiatric: Normal judgment and insight. Alert and oriented x 3. Normal mood.     Labs on Admission: I have personally reviewed following labs and imaging studies  CBC: Recent Labs  Lab 02/23/20 0944  WBC 11.9*  HGB 13.6  HCT 43.7  MCV 95.6  PLT 299   Basic Metabolic Panel: Recent Labs  Lab 02/23/20 0944  NA 137  K 4.4  CL 99  CO2 21*  GLUCOSE 325*  BUN 30*  CREATININE 0.94  CALCIUM 10.1   GFR: Estimated Creatinine Clearance: 50.1 mL/min (by C-G formula based on SCr of 0.94 mg/dL). Liver Function Tests: Recent Labs  Lab 02/23/20 0944  AST 18  ALT 22  ALKPHOS 144*  BILITOT 0.5  PROT 7.5  ALBUMIN 4.0   No results for input(s): LIPASE, AMYLASE in the last 168 hours. No results for input(s): AMMONIA in the last 168 hours. Coagulation Profile: Recent Labs  Lab 02/23/20 0944  INR 1.0   Cardiac Enzymes: No results for input(s): CKTOTAL, CKMB, CKMBINDEX, TROPONINI in the last 168 hours. BNP (last 3 results) No results for input(s): PROBNP in the last 8760 hours. HbA1C: No results for input(s): HGBA1C in the last 72 hours. CBG: No results for  input(s): GLUCAP in the last 168 hours. Lipid Profile: No results for input(s): CHOL, HDL, LDLCALC, TRIG, CHOLHDL, LDLDIRECT in the last 72 hours. Thyroid Function Tests: No results for input(s): TSH, T4TOTAL, FREET4, T3FREE, THYROIDAB in the last 72 hours. Anemia Panel: No results for input(s): VITAMINB12, FOLATE, FERRITIN, TIBC, IRON, RETICCTPCT in the last 72 hours. Urine analysis:    Component Value Date/Time   COLORURINE YELLOW 12/30/2019 1335   APPEARANCEUR HAZY (A) 12/30/2019 1335   LABSPEC 1.015 12/30/2019 1335   PHURINE 5.0 12/30/2019 1335   GLUCOSEU 50 (A) 12/30/2019 1335   HGBUR LARGE (A) 12/30/2019 1335   BILIRUBINUR NEGATIVE 12/30/2019 1335   KETONESUR NEGATIVE 12/30/2019 1335   PROTEINUR 100 (A) 12/30/2019 1335   UROBILINOGEN 0.2 05/04/2012 1109   NITRITE NEGATIVE 12/30/2019 1335   LEUKOCYTESUR SMALL (A) 12/30/2019 1335    Radiological Exams on Admission: No results found.    Assessment/Plan  Inflammatory polyradiculoneuropathy Differential diagnosis include idiopathic lumbosacral radicular plexus neuropathy/diabetic amyotrophy per neurology labs pending per neuro  -Urinalysis -B12 -Thiamine levels -B6 levels -Angiotensin-converting enzyme -Copper -SSA SSB antibodies -Antinuclear antibodies -ANCA -Copper levels in the serum -Heavy metal screen -A1c Neuro to decide on IVIG after lab results and second opinion from outpatient neurologist PT/OT will give PRN morphine for any severe pain not controlled with her chronic pain regimen  Chronic pain Continue methadone, nortriptyline, prn tizanidine  Uncontrolled Type 2 diabetes with hyperglycemia Glucose of 325 with anion gap of 17, CO2 of 21 Will give one time dose of 10 units of Novolog and place on moderate SSI Continuous IV NS 75cc fluids  Chronic migraine continue Tapentadol PRN  Continue Topamax  DVT prophylaxis:.Lovenox Code Status: Full Family Communication: Plan discussed with patient at  bedside  disposition Plan: Home with at least 2 midnight stays  Consults called: Neurology Admission status: inpatient     Status is: Inpatient  Remains inpatient appropriate because:Inpatient level of care appropriate due to severity of illness   Dispo: The patient is from: Home              Anticipated d/c is to: Home              Anticipated d/c date is: > 3 days              Patient currently is not medically stable to d/c.         Orene Desanctis DO [facility]   If 7PM-7AM, please contact night-coverage [email or web address]   02/23/2020, 8:07 PM

## 2020-02-23 NOTE — Telephone Encounter (Signed)
I have spoken with Dr. Lucia Gaskins this AM. She spoke with neurology at [facility] and pt's husband. He will take patient to [facility] ER. I spoke with triage and advised them of pt's impending arrival via car and that neurology needs to be paged for admission for IVIG. Also notified pt's husband that they are aware. He verbalized appreciation for the call.

## 2020-02-23 NOTE — Telephone Encounter (Signed)
Oral swab drug screen was consistent for prescribed medications.  ?

## 2020-02-23 NOTE — Telephone Encounter (Signed)
Patient's husband called stating that they are still waiting to be seen in the ED. He is wanting a callback to see what they should do.

## 2020-02-24 ENCOUNTER — Other Ambulatory Visit: Payer: Self-pay

## 2020-02-24 DIAGNOSIS — D72829 Elevated white blood cell count, unspecified: Secondary | ICD-10-CM

## 2020-02-24 LAB — CBC WITH DIFFERENTIAL/PLATELET
Abs Immature Granulocytes: 0.04 10*3/uL (ref 0.00–0.07)
Basophils Absolute: 0 10*3/uL (ref 0.0–0.1)
Basophils Relative: 1 %
Eosinophils Absolute: 0.2 10*3/uL (ref 0.0–0.5)
Eosinophils Relative: 2 %
HCT: 38.8 % (ref 36.0–46.0)
Hemoglobin: 12.1 g/dL (ref 12.0–15.0)
Immature Granulocytes: 1 %
Lymphocytes Relative: 24 %
Lymphs Abs: 2 10*3/uL (ref 0.7–4.0)
MCH: 30.2 pg (ref 26.0–34.0)
MCHC: 31.2 g/dL (ref 30.0–36.0)
MCV: 96.8 fL (ref 80.0–100.0)
Monocytes Absolute: 0.6 10*3/uL (ref 0.1–1.0)
Monocytes Relative: 6 %
Neutro Abs: 5.7 10*3/uL (ref 1.7–7.7)
Neutrophils Relative %: 66 %
Platelets: 181 10*3/uL (ref 150–400)
RBC: 4.01 MIL/uL (ref 3.87–5.11)
RDW: 14.2 % (ref 11.5–15.5)
WBC: 8.5 10*3/uL (ref 4.0–10.5)
nRBC: 0 % (ref 0.0–0.2)

## 2020-02-24 LAB — COMPREHENSIVE METABOLIC PANEL
ALT: 16 U/L (ref 0–44)
AST: 12 U/L — ABNORMAL LOW (ref 15–41)
Albumin: 3.2 g/dL — ABNORMAL LOW (ref 3.5–5.0)
Alkaline Phosphatase: 93 U/L (ref 38–126)
Anion gap: 10 (ref 5–15)
BUN: 23 mg/dL (ref 8–23)
CO2: 24 mmol/L (ref 22–32)
Calcium: 8.8 mg/dL — ABNORMAL LOW (ref 8.9–10.3)
Chloride: 107 mmol/L (ref 98–111)
Creatinine, Ser: 0.86 mg/dL (ref 0.44–1.00)
GFR calc Af Amer: >60 mL/min (ref >60)
GFR calc non Af Amer: >60 mL/min (ref >60)
Glucose, Bld: 97 mg/dL (ref 70–99)
Potassium: 3.9 mmol/L (ref 3.5–5.1)
Sodium: 141 mmol/L (ref 135–145)
Total Bilirubin: 0.5 mg/dL (ref 0.3–1.2)
Total Protein: 6.4 g/dL — ABNORMAL LOW (ref 6.5–8.1)

## 2020-02-24 LAB — CBG MONITORING, ED
Glucose-Capillary: 232 mg/dL — ABNORMAL HIGH (ref 70–99)
Glucose-Capillary: 80 mg/dL (ref 70–99)
Glucose-Capillary: 300 mg/dL — ABNORMAL HIGH (ref 70–99)
Glucose-Capillary: 251 mg/dL — ABNORMAL HIGH (ref 70–99)

## 2020-02-24 LAB — PHOSPHORUS: Phosphorus: 3.9 mg/dL (ref 2.5–4.6)

## 2020-02-24 LAB — MAGNESIUM: Magnesium: 2.1 mg/dL (ref 1.7–2.4)

## 2020-02-24 MED ORDER — IMMUNE GLOBULIN (HUMAN) 10 GM/100ML IV SOLN
400.0000 mg/kg | INTRAVENOUS | Status: AC
  Administered 2020-02-24 – 2020-02-27 (×4): 25 g via INTRAVENOUS
  Filled 2020-02-24: qty 300
  Filled 2020-02-24: qty 250
  Filled 2020-02-24 (×2): qty 200
  Filled 2020-02-24: qty 100
  Filled 2020-02-24: qty 200

## 2020-02-24 MED ORDER — PNEUMOCOCCAL VAC POLYVALENT 25 MCG/0.5ML IJ INJ
0.5000 mL | INJECTION | INTRAMUSCULAR | Status: DC
  Filled 2020-02-24: qty 0.5

## 2020-02-24 NOTE — Progress Notes (Addendum)
Neurology Progress note  S: Seen and examined. No acute changes  Medications  Current Facility-Administered Medications:  .  0.9 %  sodium chloride infusion, , Intravenous, Continuous, Tu, Ching T, DO, Last Rate: 75 mL/hr at 02/24/20 0054, New Bag at 02/24/20 0054 .  bisacodyl (DULCOLAX) EC tablet 5 mg, 5 mg, Oral, Daily PRN, Tu, Ching T, DO .  enoxaparin (LOVENOX) injection 40 mg, 40 mg, Subcutaneous, Q24H, Tu, Ching T, DO, 40 mg at 02/23/20 2014 .  insulin aspart (novoLOG) injection 0-15 Units, 0-15 Units, Subcutaneous, TID WC, Tu, Ching T, DO .  magnesium oxide (MAG-OX) tablet 400 mg, 400 mg, Oral, BID, Tu, Ching T, DO .  methadone (DOLOPHINE) tablet 20 mg, 20 mg, Oral, TID, Tu, Ching T, DO .  morphine 2 MG/ML injection 1 mg, 1 mg, Intravenous, Q3H PRN, Tu, Ching T, DO .  nortriptyline (PAMELOR) capsule 25 mg, 25 mg, Oral, QHS, Tu, Ching T, DO .  tapentadol (NUCYNTA) tablet 50 mg, 50 mg, Oral, Q12H PRN, Tu, Ching T, DO .  tiZANidine (ZANAFLEX) tablet 2 mg, 2 mg, Oral, Q6H PRN, Tu, Ching T, DO .  topiramate (TOPAMAX) tablet 100 mg, 100 mg, Oral, Daily, Tu, Ching T, DO .  topiramate (TOPAMAX) tablet 200 mg, 200 mg, Oral, QHS, Tu, Ching T, DO  Current Outpatient Medications:  .  bisacodyl (BISACODYL) 5 MG EC tablet, Take 5 mg by mouth daily as needed for mild constipation or moderate constipation. , Disp: , Rfl:  .  dapagliflozin propanediol (FARXIGA) 10 MG TABS tablet, Take 1 tablet (10 mg total) by mouth daily before breakfast., Disp: 30 tablet, Rfl: 2 .  magnesium oxide (MAG-OX) 400 (241.3 Mg) MG tablet, Take 1 tablet (400 mg total) by mouth 2 (two) times daily., Disp: 60 tablet, Rfl: 2 .  metFORMIN (GLUCOPHAGE) 500 MG tablet, Take 2 tablets (1,000 mg total) by mouth 2 (two) times daily., Disp: 360 tablet, Rfl: 1 .  methadone (DOLOPHINE) 10 MG tablet, Take 2 tablets (20 mg total) by mouth 3 (three) times daily., Disp: 180 tablet, Rfl: 0 .  nortriptyline (PAMELOR) 25 MG capsule, TAKE 1  CAPSULE BY MOUTH AT BEDTIME. (Patient taking differently: Take 25 mg by mouth at bedtime. ), Disp: 30 capsule, Rfl: 0 .  ondansetron (ZOFRAN) 4 MG tablet, Take 1 tablet (4 mg total) by mouth every 8 (eight) hours as needed for nausea or vomiting., Disp: 4 tablet, Rfl: 0 .  tapentadol (NUCYNTA) 50 MG tablet, Take 1 tablet (50 mg total) by mouth every 12 (twelve) hours as needed. (Patient taking differently: Take 50 mg by mouth every 12 (twelve) hours as needed (for breakthrough migraines). ), Disp: 60 tablet, Rfl: 0 .  tiZANidine (ZANAFLEX) 2 MG tablet, TAKE (1) TABLET EVERY SIX HOURS AS NEEDED FOR MUSCLE SPASMS. (Patient taking differently: Take 2 mg by mouth every 6 (six) hours as needed for muscle spasms. ), Disp: 60 tablet, Rfl: 1 .  topiramate (TOPAMAX) 50 MG tablet, TAKE 2 TABLETS IN THE MORNING AND 4 TABLETS AT BEDTIME. (Patient taking differently: Take 100-200 mg by mouth See admin instructions. Take 100 mg by mouth in the morning and 200 mg at bedtime), Disp: 180 tablet, Rfl: 0 .  ALPRAZolam (XANAX) 0.25 MG tablet, Take 1-2 tabs (0.34m-0.50mg) 30-60 minutes before procedure. May repeat if needed.Do not drive. (Patient not taking: Reported on 02/23/2020), Disp: 4 tablet, Rfl: 0 .  atorvastatin (LIPITOR) 10 MG tablet, Take 1 tablet (10 mg total) by mouth daily. (Patient not taking:  Reported on 02/23/2020), Disp: 30 tablet, Rfl: 2 .  blood glucose meter kit and supplies KIT, Dispense based on patient and insurance preference. Use up to four times daily as directed. (FOR ICD-9 250.00, 250.01)., Disp: 1 each, Rfl: 0 .  predniSONE (DELTASONE) 20 MG tablet, Take 3 tablets (60 mg total) by mouth daily with breakfast. First dose may be taken at anytime (Patient not taking: Reported on 02/23/2020), Disp: 15 tablet, Rfl: 0   Exam: unchanged frm last evening Current vital signs: BP 125/68   Pulse 94   Temp 98.2 F (36.8 C)   Resp 15   Ht _0  (1.626 m)   Wt 61.7 kg   SpO2 95%   BMI 23.34 kg/m   Vital signs in last 24 hours: Temp:  [97.9 F (36.6 C)-99.3 F (37.4 C)] 98.2 F (36.8 C) (07/14 1728) Pulse Rate:  [93-130] 94 (07/15 0500) Resp:  [12-18] 15 (07/14 1628) BP: (99-135)/(50-83) 125/68 (07/15 0500) SpO2:  [95 %-100 %] 95 % (07/15 0500) Weight:  [61.7 kg] 61.7 kg (07/14 0915) General: Awake alert oriented x3 HEENT: normocephalic atraumatic CVS: Regular rate rhythm Chest: Clear Abdomen: Nondistended nontender Neurological exam She is awake alert oriented x3 There is no dysarthria No evidence of aphasia Cranial nerves: Pupils equal round react light, extraocular movements intact, visual fields full, face symmetric, facial sensation intact, tongue and palate midline. Motor exam: Bulk of muscles in her lower extremities is reduced bilaterally most prominently in the thighs.  Her right thigh is extremely tender to touch and precluded much of the exam.  She could only give me at most 1/5 hip flexion on the right.  Also 1-2/5 knee flexion and extension on the right as well as 1-2/5 plantar and dorsiflexion on the right.  On the left hip, she is 4+/5.  Left knee flexion is also 4+/5.  Left plantar and dorsiflexors are 4 -/5. Sensory exam: There is a significant diminished sensation in a stocking pattern up to the upper third of the thigh bilaterally along with absent joint position sense in both toes. Deep tendon reflexes: Would not let me tap the right knee much but I could not elicit bilateral knee jerks.  I could not elicit bilateral ankle jerks.  I could not elicit bilateral biceps or triceps jerks.  I could not elicit brachioradialis jerks bilaterally. Gait testing was deferred at this time  Labs I have reviewed labs in epic and the results pertinent to this consultation are:  CBC    Component Value Date/Time   WBC 11.9 (H) 02/23/2020 0944   RBC 4.57 02/23/2020 0944   HGB 13.6 02/23/2020 0944   HGB 9.8 (L) 01/11/2020 1134   HCT 43.7 02/23/2020 0944   HCT 29.3 (L)  01/11/2020 1134   PLT 234 02/23/2020 0944   PLT 208 01/11/2020 1134   MCV 95.6 02/23/2020 0944   MCV 89 01/11/2020 1134   MCH 29.8 02/23/2020 0944   MCHC 31.1 02/23/2020 0944   RDW 14.3 02/23/2020 0944   RDW 14.2 01/11/2020 1134   LYMPHSABS 1.0 01/11/2020 1134   MONOABS 0.3 05/04/2012 1109   EOSABS 0.2 01/11/2020 1134   BASOSABS 0.1 01/11/2020 1134    CMP     Component Value Date/Time   NA 137 02/23/2020 0944   NA 139 01/11/2020 1134   K 4.4 02/23/2020 0944   CL 99 02/23/2020 0944   CO2 21 (L) 02/23/2020 0944   GLUCOSE 325 (H) 02/23/2020 0944   BUN 30 (H)  02/23/2020 0944   BUN 13 01/11/2020 1134   CREATININE 0.94 02/23/2020 0944   CREATININE 1.09 05/04/2012 1109   CALCIUM 10.1 02/23/2020 0944   PROT 7.5 02/23/2020 0944   PROT 6.9 10/22/2019 1012   ALBUMIN 4.0 02/23/2020 0944   ALBUMIN 4.3 10/22/2019 1012   AST 18 02/23/2020 0944   ALT 22 02/23/2020 0944   ALKPHOS 144 (H) 02/23/2020 0944   BILITOT 0.5 02/23/2020 0944   BILITOT <0.2 10/22/2019 1012   GFRNONAA >60 02/23/2020 0944   GFRAA >60 02/23/2020 0944   B12-600 Folate 45 A1c - 8.6 Other labs pending   Imaging I have reviewed the images obtained: -MRI lumbar spine March 2021-without contrast-severe facet degeneration at L4-5 greater on the right and L5-S1 greater on the left levels.  No significant lumbar spinal stenosis.  Mild left lateral recess stenosis at L5-S1.  Moderate left L2 foraminal stenosis.  -MRI pelvis with and without contrast-no acute findings.  Mild degenerative postsurgical changes within the lumbar spine associated with convex left scoliosis.  No evidence of sacral nerve root enhancement or encroachment.  Mild muscular asymmetries.  -C-spine from May 2021-postop changes at C5 and C7 levels without evidence of complication.  No high-grade canal stenosis on CT.  -CT myelogram July 2021-mild curvature convex to the left 2 mm chronic anterior listhesis L4-5.  Previous posterior decompression and  discectomy at L4-5.  No compressive stenosis.  Bilateral facet arthritis.  L5-S1 facet osteoarthritis with 2 mm anterolisthesis.  No compressive stenosis.  Mild noncompressive disc bulges at L2-3 and L3-4.  Question if the nerve roots of the cauda equina are minimally larger than normal some measuring up to 1 to 1.2 mm in diameter raising the possibility of some sort of neuropathic process.  This is a potential finding and not confirmed.  Assessment:  69 year old with uncontrolled diabetes, chronic pain, multiple back surgeries, PTSD, presenting for evaluation of progressive painful weakness of legs-right worse than left that has been going on since January 2021 or maybe even since November 2020 when she started having some hip and back pain. Her electrodiagnostic studies are suggestive of lumbosacral polyradiculoneuropathy. It is hard to interpret her CSF findings-elevated protein at 123 with no cells in the setting of hyperglycemia and uncontrolled diabetes-could simply be because of the diabetes or could be secondary to the inflammatory polyradiculoneuropathy process. Given her uncontrolled diabetes and the painful progression of her weakness which is asymmetric, diabetic amyotrophy also has to be kept in the differentials and I would keep that pretty high. In either case IVIG is the only thing that could  help but she needs a little bit of further work-up for other sensorimotor neuropathies prior to starting any immune therapy.  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 Other differentials to consider are nondiabetes related sensorimotor neuropathies.  Recommendations: B12 and folate normal. Other labs pending.  -Pain management per medicine -PT OT -Pt has not had a contrast-enhanced scan of the lumbar spine-the scan that was pelvis MRI with and without contrast was actually only a  without contrast study as the patient could not tolerate the full study and contrast could not be administered.  -I recommended getting MRI L-spine with contrast.Discussed with patient who refused to go back in the MRI scanner. -I will also keep her hydrated well with IV fluids. -Will d/w OP neurologist and consider IVIG. Will come back and discuss with patient and husband when he arrives.  Please call  with questions.  -- Amie Portland, MD [facility] Pager: [phone] If 7pm to 7am, please call on call as listed on AMION.   Addendum Discussed with Dr. Lavell Anchors. Patient is very against even trying an MRI and says that the only way she would get it done is under general anesthesia. Given her current comorbidities, I do not think that the yield of a L-spine MRI with contrast is high enough to take the risk of intubating her and putting her under general anesthesia. We have discussed the case extensively and I am recommending we start IVIG. I have placed the orders. We will need to do daily IVIG-total dose of 2 g/kg divided over 5 doses. I have discussed my plan with Dr. Katherine Roan hospitalist.  -- Amie Portland, MD [facility] Pager: [phone] If 7pm to 7am, please call on call as listed on AMION.

## 2020-02-24 NOTE — Evaluation (Signed)
Physical Therapy Evaluation Patient Details Name: Monica [name] MRN: [id number] DOB: [birth year] Today's Date: 02/24/2020   History of Present Illness  Pt is a 69 year old diabetic female who presented 5/20 with new renal failure, metabolic acidosis and acute metabolic encephalopathy.  Clinical Impression  Pt was seen for mobility to get onto [facility] x 2, both to use the chair and during transfer to regular bed.  Pt is in some pain with her legs, but is focusing on her weakness with the neuropathy.  Follow acutely for a progression to home therapy as pt is expecting to be able to return there.  Her husband has been caring for her at home, and will anticipate her to make progress such that she can be assisted with one person to walk and get onto bed.      Follow Up Recommendations Home health PT;Supervision for mobility/OOB    Equipment Recommendations  Rolling walker with 5" wheels    Recommendations for Other Services       Precautions / Restrictions Precautions Precautions: Fall Precaution Comments: weak and unsteady RLE Restrictions Weight Bearing Restrictions: No Other Position/Activity Restrictions: no braces have been obtained      Mobility  Bed Mobility Overal bed mobility: Needs Assistance Bed Mobility: Supine to Sit;Sit to Supine     Supine to sit: Min assist;Mod assist Sit to supine: Mod assist      Transfers Overall transfer level: Needs assistance Equipment used: 2 person hand held assist;1 person hand held assist Transfers: Sit to/from [employer] Sit to Stand: Mod assist;+2 physical assistance Stand pivot transfers: Mod assist;+2 physical assistance          Ambulation/Gait Ambulation/Gait assistance: Mod assist;+2 safety/equipment;+2 physical assistance Gait Distance (Feet): 8 Feet Assistive device: 2 person hand held assist Gait Pattern/deviations: Step-through pattern;Step-to pattern;Shuffle;Scissoring Gait velocity: reduced Gait  velocity interpretation: <1.31 ft/sec, indicative of household ambulator General Gait Details: due to weakness on RLE is awkward and weak to step and pivot  Stairs            Wheelchair Mobility    Modified Rankin (Stroke Patients Only)       Balance Overall balance assessment: Needs assistance Sitting-balance support: Feet supported Sitting balance-Leahy Scale: Fair Sitting balance - Comments: no pain complaints   Standing balance support: Bilateral upper extremity supported;During functional activity Standing balance-Leahy Scale: Poor                               Pertinent Vitals/Pain Pain Assessment: No/denies pain    Home Living Family/patient expects to be discharged to:: Private residence Living Arrangements: Spouse/significant other Available Help at Discharge: Family;Available 24 hours/day Type of Home: House Home Access: Stairs to enter Entrance Stairs-Rails: Right Entrance Stairs-Number of Steps: 5 on side. 4 in front Home Layout: Multi-level;Able to live on main level with bedroom/bathroom Home Equipment: Dan Humphreys - 2 wheels;Cane - single point      Prior Function Level of Independence: Needs assistance   Gait / Transfers Assistance Needed: very short walks with HHA  ADL's / Homemaking Assistance Needed: husband assists with bathing and dressing. Husband completes all iADLs.        Hand Dominance   Dominant Hand: Right    Extremity/Trunk Assessment   Upper Extremity Assessment Upper Extremity Assessment: Overall WFL for tasks assessed    Lower Extremity Assessment Lower Extremity Assessment: Generalized weakness;LLE deficits/detail;RLE deficits/detail RLE Deficits / Details: general weakness at  3+ RLE Coordination: decreased gross motor LLE Coordination: decreased gross motor    Cervical / Trunk Assessment Cervical / Trunk Assessment: Normal  Communication   Communication: No difficulties  Cognition Arousal/Alertness:  Awake/alert Behavior During Therapy: WFL for tasks assessed/performed Overall Cognitive Status: Within Functional Limits for tasks assessed                                 General Comments: motivated to get up to [facility]      General Comments General comments (skin integrity, edema, etc.): pt is encouragd to use weak RLE with knee support    Exercises     Assessment/Plan    PT Assessment Patient needs continued PT services  PT Problem List Decreased strength;Decreased activity tolerance;Decreased range of motion;Decreased balance;Decreased mobility;Decreased coordination;Decreased cognition;Decreased knowledge of use of DME;Decreased safety awareness;Decreased knowledge of precautions;Cardiopulmonary status limiting activity       PT Treatment Interventions DME instruction;Gait training;Stair training;Functional mobility training;Therapeutic activities;Therapeutic exercise;Balance training;Neuromuscular re-education;Patient/family education    PT Goals (Current goals can be found in the Care Plan section)  Acute Rehab PT Goals Patient Stated Goal: to be independent with her  PT Goal Formulation: With patient/family Time For Goal Achievement: 03/09/20    Frequency Min 3X/week   Barriers to discharge Inaccessible home environment      Co-evaluation               AM-PAC PT "6 Clicks" Mobility  Outcome Measure   Help needed moving from lying on your back to sitting on the side of a flat bed without using bedrails?: A Little Help needed moving to and from a bed to a chair (including a wheelchair)?: A Lot Help needed standing up from a chair using your arms (e.g., wheelchair or bedside chair)?: A Lot Help needed to walk in hospital room?: A Lot Help needed climbing 3-5 steps with a railing? : A Lot 6 Click Score: 11    End of Session Equipment Utilized During Treatment: Gait belt Activity Tolerance: Treatment limited secondary to medical complications  (Comment);Patient limited by fatigue Patient left: in bed;with call bell/phone within reach;with family/visitor present Nurse Communication: Mobility status PT Visit Diagnosis: Unsteadiness on feet (R26.81);Muscle weakness (generalized) (M62.81);Difficulty in walking, not elsewhere classified (R26.2)    Time: 1660-6004 PT Time Calculation (min) (ACUTE ONLY): 42 min   Charges:   PT Evaluation $PT Eval Moderate Complexity: 1 Mod PT Treatments $Therapeutic Activity: 23-37 mins       Ivar Drape 02/24/2020, 9:58 PM  Samul Dada, PT MS Acute Rehab Dept. Number: [facility] [id number] and [facility] [phone]

## 2020-02-24 NOTE — ED Notes (Signed)
Attempted report x1. 

## 2020-02-24 NOTE — ED Notes (Signed)
Lunch Tray Ordered @ 1120. 

## 2020-02-24 NOTE — Progress Notes (Signed)
PROGRESS NOTE    Monica [name]  DGU:[id number] DOB: [birth year] DOA: 02/23/2020 PCP: [name], FNP   Brief Narrative: HPI per Dr. Benita Gutter on 02/23/20 Monica [name] is a 69 y.o. female with medical history significant for chronic migraine, type 2 diabetes, chronic pain on methadone, PTSD who presents with concerns of worsening lower leg pain.  Patient has been having bilateral lower leg pain for the past 8 months and was referred to neurology by her PMR physician. She has had extensive work-up including an MRI of the lumbar spine, EMG and an LP. EMG noted to have right L3 radiculopathy and right L5 radiculopathy.  LP noted to have few cells but significantly elevated CSF protein consistent with inflammatory polyradiculoneuropathy and IVIG was recommended. She was recently given a week of steroids at the beginning of July.  For the past 2 to 3 weeks her pain has acutely worsened and she has not been able to walk and now has been wheelchair-bound. She describes the pain as being sharp, electric-like with numbness and tingling of her bilateral feet.  It is worse when she sits down.   Her left leg is usually better than her right but recently it also started to have worsening symptoms.  She also feels as if her waistline is actually up at her lower ribs.  Also has been having decreased bowel movement since symptoms started 8 months ago and has only been able to have 1 bowel movement per month.  Her last bowel movement was 2 days ago after taking Dulcolax.  Denies any issues with urine output.  Denies any abdominal pain.  No nausea or vomiting.  She was sent to ED by outpatient neurology for IVIG due to progressively worsening symptoms.   ED Course: She was afebrile, normotensive and mildly tachycardic up to 130.  She has mild leukocytosis of 11.9 although recently was on prednisone.  Glucose of 325 with anion gap of 17, CO2 of 21.  **Interim History  Neurology evaluating and recommending  several laboratory studies and recommended L-spine MRI with contrast however she does not want to have this done.  Neurology is now starting the patient on IVIG daily empirically of 2 g/kg divided over 5 doses  Assessment & Plan:   Principal Problem:   Inflammatory polyneuropathy (HCC) Active Problems:   Chronic pain   Uncontrolled type 2 diabetes mellitus with hyperglycemia, without long-term current use of insulin (HCC)   Migraine  Inflammatory polyradiculoneuropathy -Differential diagnosis include idiopathic lumbosacral radicular plexus neuropathy/diabetic amyotrophy per neurology; feel it is less likely AIDP more likely to be idiopathic lumbosacral radicular plexus neuropathy but then they also feel that other differentials or to consider nondiabetes related sensory or motor neuropathies -Recent lumbar CT spine showed "Mild curvature convex to the left. 2 mm chronic anterolisthesis L4-5. Previous posterior decompression and discectomy at L4-5. No compressive stenosis. Bilateral facet osteoarthritis. L5-S1 facet osteoarthritis with 2 mm of anterolisthesis. No compressive stenosis. Mild non-compressive disc bulges at L2-3 and L3-4. Question if the nerve roots of the cauda equina are minimally larger than normal, some measuring up to 1-1.2 mm in diameter, raising the possibility of some neuropathic process. I do not think this potential finding is definitely valid." -MRI Pelvis on 02/18/20 showed "No acute findings or explanation for the patient's symptoms.  Mild degenerative and postsurgical changes within the lumbar spine associated with a convex left scoliosis. No evidence of sacral nerve root encroachment. Mild muscular asymmetries as described." -Neurology recommended getting  an MRI of the L-spine with contrast however she does not want to have a repeat MRI and only where she will get it done is under general anesthesia labs pending per neuro  -Urinalysis -B12 and folate level were  normal -Thiamine levels pending -B6 levels, pending -Angiotensin-converting enzyme pending -Copper, pending -SSA SSB antibodies, pending -Antinuclear antibodies, pending -ANCA, pending -Copper levels in the serum still pending -Heavy metal screen -Hemoglobin A1c was 8.6 -Neuro to decide on IVIG after lab results and second opinion from outpatient neurologist and they have decided to initiate IVIG and she will be getting her first dose now -Obtain PT/OT -will give PRN morphine for any severe pain not controlled with her chronic pain regimen -Continue supportive care with normal saline at 75 MLS per hour -Continue with bowel regimen bisacodyl 5 mg p.o. daily as needed for mild constipation  Chronic pain -Continue methadone 20 mg p.o. 3 times daily, nortriptyline 25 mg p.o. nightly, prn tizanidine dose p.o. every 6 hours as needed for muscle spasm -Continue judicious use of her pain medications and she follows with Dr. Whitney PostSchwartz [place] for pain  Uncontrolled Type 2 diabetes with hyperglycemia -Glucose of 325 with anion gap of 17, CO2 of 21 on admission: Metabolic acidosis now improved with a CO2 of 24, anion gap of 10, chloride level of 107 -She was given Will give one time dose of 10 units of Novolog and place on moderate SSI -Hemoglobin A1c is 8.6 -Continuous IV NS 75cc fluids -CBGs ranging from 80-300  -Holding home Metformin 1000 mg po BID and Dapagliflozin 1 tab po daily  -we will consult diabetes education coordinator for further evaluation recommendations  Chronic migraine -continue Tapentadol PRN  -Continue Topamax  Leukocytosis -Improved and patient's WBC went from 11.9 is now 8.5 -Continue to monitor and trend and repeat CBC in a.m.  HLD -Holding Atorvastatin 10 mg po qHS  DVT prophylaxis: Enoxaparin 40 mg subcu every 24 Code Status: FULL CODE Family Communication: No family present at bedside  Disposition Plan: Pending further evaluation and clearance by  neurology  Status is: Inpatient  Remains inpatient appropriate because:Unsafe d/c plan, IV treatments appropriate due to intensity of illness or inability to take PO and Inpatient level of care appropriate due to severity of illness   Dispo: The patient is from: Home              Anticipated d/c is to: TBD              Anticipated d/c date is: > 3 days              Patient currently is not medically stable to d/c.  Consultants:  Neurology   Procedures:  None  Antimicrobials:  Anti-infectives (From admission, onward)   None     Subjective: Seen and examined at bedside and states that she is in significant amount of pain.  No chest pain, lightheadedness or dizziness but states her low back and legs are hurting significantly.  States is worse on the right side compared to left.  No lightheadedness or dizziness.  No other concerns or plans at this time.  Objective: Vitals:   02/24/20 1030 02/24/20 1100 02/24/20 1300 02/24/20 1330  BP:  128/77 124/79 130/65  Pulse: 98 99 98 97  Resp:      Temp:      TempSrc:      SpO2: 98% 97% 96% 96%  Weight:      Height:  No intake or output data in the 24 hours ending 02/24/20 1547 Filed Weights   02/23/20 0915  Weight: 61.7 kg   Examination: Physical Exam:  Constitutional: Thin slightly ill-appearing Caucasian female currently in moderate distress appears extremely uncomfortable and is a little anxious Eyes: Lids and conjunctivae normal, sclerae anicteric  ENMT: External Ears, Nose appear normal. Grossly normal hearing.  Neck: Appears normal, supple, no cervical masses, normal ROM, no appreciable thyromegaly; no JVD Respiratory: Diminished to auscultation bilaterally, no wheezing, rales, rhonchi or crackles. Normal respiratory effort and patient is not tachypenic. No accessory muscle use.  Unlabored breathing Cardiovascular: RRR, no murmurs / rubs / gallops. S1 and S2 auscultated.  Minimal extremity edema Abdomen: Soft,  non-tender, non-distended.  Bowel sounds positive.  GU: Deferred. Musculoskeletal: No clubbing / cyanosis of digits/nails. No joint deformity upper and lower extremities.  Skin: No rashes, lesions, ulcers. No induration; Warm and dry.  Neurologic: CN 2-12 grossly intact with no focal deficits. Romberg sign and cerebellar reflexes not assessed.  Psychiatric: Normal judgment and insight. Alert and oriented x 3.  Anxious mood and appropriate affect.   Data Reviewed: I have personally reviewed following labs and imaging studies  CBC: Recent Labs  Lab 02/23/20 0944 02/24/20 0855  WBC 11.9* 8.5  NEUTROABS  --  5.7  HGB 13.6 12.1  HCT 43.7 38.8  MCV 95.6 96.8  PLT 234 181   Basic Metabolic Panel: Recent Labs  Lab 02/23/20 0944 02/24/20 0855  NA 137 141  K 4.4 3.9  CL 99 107  CO2 21* 24  GLUCOSE 325* 97  BUN 30* 23  CREATININE 0.94 0.86  CALCIUM 10.1 8.8*  MG  --  2.1  PHOS  --  3.9   GFR: Estimated Creatinine Clearance: 54.8 mL/min (by C-G formula based on SCr of 0.86 mg/dL). Liver Function Tests: Recent Labs  Lab 02/23/20 0944 02/24/20 0855  AST 18 12*  ALT 22 16  ALKPHOS 144* 93  BILITOT 0.5 0.5  PROT 7.5 6.4*  ALBUMIN 4.0 3.2*   No results for input(s): LIPASE, AMYLASE in the last 168 hours. No results for input(s): AMMONIA in the last 168 hours. Coagulation Profile: Recent Labs  Lab 02/23/20 0944  INR 1.0   Cardiac Enzymes: No results for input(s): CKTOTAL, CKMB, CKMBINDEX, TROPONINI in the last 168 hours. BNP (last 3 results) No results for input(s): PROBNP in the last 8760 hours. HbA1C: Recent Labs    02/23/20 1957  HGBA1C 8.6*   CBG: Recent Labs  Lab 02/24/20 0039 02/24/20 0824 02/24/20 1327  GLUCAP 232* 80 300*   Lipid Profile: No results for input(s): CHOL, HDL, LDLCALC, TRIG, CHOLHDL, LDLDIRECT in the last 72 hours. Thyroid Function Tests: No results for input(s): TSH, T4TOTAL, FREET4, T3FREE, THYROIDAB in the last 72 hours. Anemia  Panel: Recent Labs    02/23/20 1957  VITAMINB12 600  FOLATE 45.0   Sepsis Labs: No results for input(s): PROCALCITON, LATICACIDVEN in the last 168 hours.  Recent Results (from the past 240 hour(s))  CSF culture     Status: None   Collection Time: 02/17/20 11:39 AM   Specimen: Lumbar Puncture; Cerebrospinal Fluid  Result Value Ref Range Status   MICRO NUMBER: [id number]  Final   SPECIMEN QUALITY: Adequate  Final   Source CEREBROSPINAL FLUID (CSF)  Final   STATUS: FINAL  Final   GRAM STAIN: No organisms or white blood cells seen  Final   Result: No Growth  Final  SARS Coronavirus 2 by  RT PCR (hospital order, performed in [facility] hospital lab) Nasopharyngeal Nasopharyngeal Swab     Status: None   Collection Time: 02/23/20  8:07 PM   Specimen: Nasopharyngeal Swab  Result Value Ref Range Status   SARS Coronavirus 2 NEGATIVE NEGATIVE Final    Comment: (NOTE) SARS-CoV-2 target nucleic acids are NOT DETECTED.  The SARS-CoV-2 RNA is generally detectable in upper and lower respiratory specimens during the acute phase of infection. The lowest concentration of SARS-CoV-2 viral copies this assay can detect is 250 copies / mL. A negative result does not preclude SARS-CoV-2 infection and should not be used as the sole basis for treatment or other patient management decisions.  A negative result may occur with improper specimen collection / handling, submission of specimen other than nasopharyngeal swab, presence of viral mutation(s) within the areas targeted by this assay, and inadequate number of viral copies (<250 copies / mL). A negative result must be combined with clinical observations, patient history, and epidemiological information.  Fact Sheet for Patients:   BoilerBrush.com.cy  Fact Sheet for Healthcare Providers: https://pope.com/  This test is not yet approved or  cleared by the [place] FDA and has been authorized for  detection and/or diagnosis of SARS-CoV-2 by FDA under an Emergency Use Authorization (EUA).  This EUA will remain in effect (meaning this test can be used) for the duration of the COVID-19 declaration under Section 564(b)(1) of the Act, 21 U.S.C. section 360bbb-3(b)(1), unless the authorization is terminated or revoked sooner.  Performed at [facility], [address], [place], [place] [address]     RN Pressure Injury Documentation:     Estimated body mass index is 23.34 kg/m as calculated from the following:   Height as of this encounter: 5\' 4"  (1.626 m).   Weight as of this encounter: 61.7 kg.  Malnutrition Type:      Malnutrition Characteristics:      Nutrition Interventions:     Radiology Studies: No results found.  Scheduled Meds: . enoxaparin (LOVENOX) injection  40 mg Subcutaneous Q24H  . insulin aspart  0-15 Units Subcutaneous TID WC  . magnesium oxide  400 mg Oral BID  . methadone  20 mg Oral TID  . nortriptyline  25 mg Oral QHS  . topiramate  100 mg Oral Daily  . topiramate  200 mg Oral QHS   Continuous Infusions: . sodium chloride 75 mL/hr at 02/24/20 0914  . Immune Globulin 10%       LOS: 1 day   02/26/20, DO [facility] PAGER is on AMION  If 7PM-7AM, please contact night-coverage [email or web address]

## 2020-02-24 NOTE — ED Notes (Signed)
Breakfast Ordered--Monica [name]  

## 2020-02-25 ENCOUNTER — Other Ambulatory Visit: Payer: Self-pay | Admitting: Neurological Surgery

## 2020-02-25 LAB — COMPREHENSIVE METABOLIC PANEL
ALT: 17 U/L (ref 0–44)
AST: 18 U/L (ref 15–41)
Albumin: 2.6 g/dL — ABNORMAL LOW (ref 3.5–5.0)
Alkaline Phosphatase: 76 U/L (ref 38–126)
Anion gap: 8 (ref 5–15)
BUN: 17 mg/dL (ref 8–23)
CO2: 22 mmol/L (ref 22–32)
Calcium: 8.2 mg/dL — ABNORMAL LOW (ref 8.9–10.3)
Chloride: 106 mmol/L (ref 98–111)
Creatinine, Ser: 0.81 mg/dL (ref 0.44–1.00)
GFR calc Af Amer: >60 mL/min (ref >60)
GFR calc non Af Amer: >60 mL/min (ref >60)
Glucose, Bld: 157 mg/dL — ABNORMAL HIGH (ref 70–99)
Potassium: 3.6 mmol/L (ref 3.5–5.1)
Sodium: 136 mmol/L (ref 135–145)
Total Bilirubin: 0.6 mg/dL (ref 0.3–1.2)
Total Protein: 6.2 g/dL — ABNORMAL LOW (ref 6.5–8.1)

## 2020-02-25 LAB — CBC WITH DIFFERENTIAL/PLATELET
Abs Immature Granulocytes: 0.03 10*3/uL (ref 0.00–0.07)
Basophils Absolute: 0 10*3/uL (ref 0.0–0.1)
Basophils Relative: 0 %
Eosinophils Absolute: 0.1 10*3/uL (ref 0.0–0.5)
Eosinophils Relative: 2 %
HCT: 34.2 % — ABNORMAL LOW (ref 36.0–46.0)
Hemoglobin: 10.4 g/dL — ABNORMAL LOW (ref 12.0–15.0)
Immature Granulocytes: 0 %
Lymphocytes Relative: 15 %
Lymphs Abs: 1 10*3/uL (ref 0.7–4.0)
MCH: 28.8 pg (ref 26.0–34.0)
MCHC: 30.4 g/dL (ref 30.0–36.0)
MCV: 94.7 fL (ref 80.0–100.0)
Monocytes Absolute: 0.4 10*3/uL (ref 0.1–1.0)
Monocytes Relative: 5 %
Neutro Abs: 5.3 10*3/uL (ref 1.7–7.7)
Neutrophils Relative %: 78 %
Platelets: 127 10*3/uL — ABNORMAL LOW (ref 150–400)
RBC: 3.61 MIL/uL — ABNORMAL LOW (ref 3.87–5.11)
RDW: 13.7 % (ref 11.5–15.5)
WBC: 6.8 10*3/uL (ref 4.0–10.5)
nRBC: 0 % (ref 0.0–0.2)

## 2020-02-25 LAB — FANA STAINING PATTERNS: Homogeneous Pattern: 1:160 {titer} — ABNORMAL HIGH

## 2020-02-25 LAB — GLUCOSE, CAPILLARY
Glucose-Capillary: 131 mg/dL — ABNORMAL HIGH (ref 70–99)
Glucose-Capillary: 176 mg/dL — ABNORMAL HIGH (ref 70–99)
Glucose-Capillary: 195 mg/dL — ABNORMAL HIGH (ref 70–99)
Glucose-Capillary: 300 mg/dL — ABNORMAL HIGH (ref 70–99)

## 2020-02-25 LAB — ANCA TITERS
Atypical P-ANCA titer: <1:20 {titer}
C-ANCA: <1:20 {titer}
P-ANCA: <1:20 {titer}

## 2020-02-25 LAB — VITAMIN B12: Vitamin B-12: 490 pg/mL (ref 180–914)

## 2020-02-25 LAB — PHOSPHORUS: Phosphorus: 3.6 mg/dL (ref 2.5–4.6)

## 2020-02-25 LAB — ANTINUCLEAR ANTIBODIES, IFA: ANA Ab, IFA: POSITIVE — AB

## 2020-02-25 LAB — SJOGRENS SYNDROME-A EXTRACTABLE NUCLEAR ANTIBODY: SSA (Ro) (ENA) Antibody, IgG: <0.2 AI (ref 0.0–0.9)

## 2020-02-25 LAB — ANGIOTENSIN CONVERTING ENZYME: Angiotensin-Converting Enzyme: 53 U/L (ref 14–82)

## 2020-02-25 LAB — HEAVY METALS, BLOOD
Arsenic: 5 ug/L (ref 2–23)
Lead: <1 ug/dL (ref 0–4)
Mercury: <1 ug/L (ref 0.0–14.9)

## 2020-02-25 LAB — SJOGRENS SYNDROME-B EXTRACTABLE NUCLEAR ANTIBODY: SSB (La) (ENA) Antibody, IgG: <0.2 AI (ref 0.0–0.9)

## 2020-02-25 LAB — MAGNESIUM: Magnesium: 1.9 mg/dL (ref 1.7–2.4)

## 2020-02-25 MED ORDER — INSULIN ASPART 100 UNIT/ML ~~LOC~~ SOLN
6.0000 [IU] | Freq: Once | SUBCUTANEOUS | Status: AC
  Administered 2020-02-25: 6 [IU] via SUBCUTANEOUS

## 2020-02-25 MED ORDER — INSULIN ASPART 100 UNIT/ML ~~LOC~~ SOLN: 0.0000 [IU] | Freq: Three times a day (TID) | SUBCUTANEOUS | Status: DC

## 2020-02-25 MED ORDER — MORPHINE SULFATE (PF) 2 MG/ML IV SOLN
1.0000 mg | INTRAVENOUS | Status: DC | PRN
  Administered 2020-02-25 – 2020-02-29 (×18): 1 mg via INTRAVENOUS
  Filled 2020-02-25 (×20): qty 1

## 2020-02-25 MED ORDER — ENSURE ENLIVE PO LIQD
237.0000 mL | Freq: Three times a day (TID) | ORAL | Status: DC
  Administered 2020-02-25 – 2020-03-03 (×18): 237 mL via ORAL

## 2020-02-25 MED ORDER — INSULIN ASPART 100 UNIT/ML ~~LOC~~ SOLN
0.0000 [IU] | Freq: Three times a day (TID) | SUBCUTANEOUS | Status: DC
  Administered 2020-02-26: 5 [IU] via SUBCUTANEOUS
  Administered 2020-02-26: 8 [IU] via SUBCUTANEOUS
  Administered 2020-02-26 (×2): 3 [IU] via SUBCUTANEOUS
  Administered 2020-02-27: 5 [IU] via SUBCUTANEOUS
  Administered 2020-02-27: 8 [IU] via SUBCUTANEOUS
  Administered 2020-02-27: 3 [IU] via SUBCUTANEOUS
  Administered 2020-02-27: 8 [IU] via SUBCUTANEOUS
  Administered 2020-02-28: 5 [IU] via SUBCUTANEOUS
  Administered 2020-02-28: 2 [IU] via SUBCUTANEOUS
  Administered 2020-02-28: 3 [IU] via SUBCUTANEOUS
  Administered 2020-02-28: 8 [IU] via SUBCUTANEOUS
  Administered 2020-02-29: 11 [IU] via SUBCUTANEOUS
  Administered 2020-02-29 (×2): 8 [IU] via SUBCUTANEOUS
  Administered 2020-02-29: 11 [IU] via SUBCUTANEOUS
  Administered 2020-03-01: 3 [IU] via SUBCUTANEOUS
  Administered 2020-03-01: 8 [IU] via SUBCUTANEOUS
  Administered 2020-03-01: 3 [IU] via SUBCUTANEOUS
  Administered 2020-03-01: 11 [IU] via SUBCUTANEOUS
  Administered 2020-03-02: 3 [IU] via SUBCUTANEOUS

## 2020-02-25 MED ORDER — INSULIN ASPART 100 UNIT/ML ~~LOC~~ SOLN
3.0000 [IU] | Freq: Three times a day (TID) | SUBCUTANEOUS | Status: DC
  Administered 2020-02-25 – 2020-02-29 (×9): 3 [IU] via SUBCUTANEOUS

## 2020-02-25 NOTE — Progress Notes (Signed)
OT Cancellation Note  Patient Details Name: Monica [name] MRN: [id number] DOB: 1950/11/17   Cancelled Treatment:    Reason Eval/Treat Not Completed: Patient declined, no reason specified (due to pain )  Wynona Neat, OTR/L  Acute Rehabilitation Services Pager: [phone] Office: [phone] .  02/25/2020, 11:34 AM

## 2020-02-25 NOTE — Progress Notes (Signed)
PT Cancellation Note  Patient Details Name: Monica [name] MRN: [id number] DOB: 07-Sep-1950   Cancelled Treatment:    Reason Eval/Treat Not Completed: Pain limiting ability to participate. PT attempted x 2 (prior to pain meds and after receiving morphine). Pt declined both attempts stating she is just in too much pain. PT to re-attempt as time allows.   Ilda Foil 02/25/2020, 9:18 AM   Aida Raider, PT  Office # [phone] Pager [phone]

## 2020-02-25 NOTE — Plan of Care (Signed)

## 2020-02-25 NOTE — Plan of Care (Signed)

## 2020-02-25 NOTE — Progress Notes (Signed)
Briefly seen and examined No changes. Still extremely painful RLE and now complain of painful knee on LLE as well in addition to some pain/discomfort in RUE. Discussed again briefly with outpatient neurologist and both her and I agree that the next step to pursue should nerve muscle biopsy. Right quadriceps and right sural nerve biopsy might have the best yield given the EMG and clinical findings affecting that limb the most. Will contact NSGY to schedule this at the soonest.  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 Other differentials to consider are nondiabetes related sensorimotor neuropathies.   Recs: NSGY consult for nerve muscle biopsy C/W Total 5d of IVMP. PT OT Pain management  -- Milon Dikes, MD [facility] Pager: [phone] If 7pm to 7am, please call on call as listed on AMION.

## 2020-02-25 NOTE — Progress Notes (Signed)
Pt's CBG 300 w/o insulin coverage. [place] informed.

## 2020-02-25 NOTE — Progress Notes (Signed)
PROGRESS NOTE    [name]  WUJ:[id number] DOB: Dec 09, 1950 DOA: 02/23/2020 PCP: Gwenlyn FudgeJoyce, Britney F, FNP   Brief Narrative: HPI per Dr. Benita Gutterhing Tu on 02/23/20 Monica [name] is a 69 y.o. female with medical history significant for chronic migraine, type 2 diabetes, chronic pain on methadone, PTSD who presents with concerns of worsening lower leg pain.  Patient has been having bilateral lower leg pain for the past 8 months and was referred to neurology by her PMR physician. She has had extensive work-up including an MRI of the lumbar spine, EMG and an LP. EMG noted to have right L3 radiculopathy and right L5 radiculopathy.  LP noted to have few cells but significantly elevated CSF protein consistent with inflammatory polyradiculoneuropathy and IVIG was recommended. She was recently given a week of steroids at the beginning of July.  For the past 2 to 3 weeks her pain has acutely worsened and she has not been able to walk and now has been wheelchair-bound. She describes the pain as being sharp, electric-like with numbness and tingling of her bilateral feet.  It is worse when she sits down.   Her left leg is usually better than her right but recently it also started to have worsening symptoms.  She also feels as if her waistline is actually up at her lower ribs.  Also has been having decreased bowel movement since symptoms started 8 months ago and has only been able to have 1 bowel movement per month.  Her last bowel movement was 2 days ago after taking Dulcolax.  Denies any issues with urine output.  Denies any abdominal pain.  No nausea or vomiting.  She was sent to ED by outpatient neurology for IVIG due to progressively worsening symptoms.   ED Course: She was afebrile, normotensive and mildly tachycardic up to 130.  She has mild leukocytosis of 11.9 although recently was on prednisone.  Glucose of 325 with anion gap of 17, CO2 of 21.  **Interim History  Neurology evaluating and recommending  several laboratory studies and recommended L-spine MRI with contrast however she does not want to have this done.  Neurology is now starting the patient on IVIG daily empirically of 2 g/kg divided over 5 doses  02/25/20: Neurology recommending a possible nerve muscle biopsy and recommending continuing 5 days of IVIG and Dr. Wilford CornerArora recommends to contact Neurosurgery to schedule Bx. patient was complaining of a pain 8 out of 10 this morning however then she states it acutely worsened after IVIG.  We will titrate up her morphine however will need to be judicious given that she is on chronic pain patient and already on methadone.  PT and OT unable to work with the patient today given her pain  Assessment & Plan:   Principal Problem:   Inflammatory polyneuropathy (HCC) Active Problems:   Chronic pain   Uncontrolled type 2 diabetes mellitus with hyperglycemia, without long-term current use of insulin (HCC)   Migraine  Inflammatory polyradiculoneuropathy -Differential diagnosis include idiopathic lumbosacral radicular plexus neuropathy/diabetic amyotrophy per neurology; feel it is less likely AIDP more likely to be idiopathic lumbosacral radicular plexus neuropathy but then they also feel that other differentials or to consider nondiabetes related sensory or motor neuropathies -Recent lumbar CT spine showed "Mild curvature convex to the left. 2 mm chronic anterolisthesis L4-5. Previous posterior decompression and discectomy at L4-5. No compressive stenosis. Bilateral facet osteoarthritis. L5-S1 facet osteoarthritis with 2 mm of anterolisthesis. No compressive stenosis. Mild non-compressive disc bulges at L2-3  and L3-4. Question if the nerve roots of the cauda equina are minimally larger than normal, some measuring up to 1-1.2 mm in diameter, raising the possibility of some neuropathic process. I do not think this potential finding is definitely valid." -MRI Pelvis on 02/18/20 showed "No acute findings or  explanation for the patient's symptoms.  Mild degenerative and postsurgical changes within the lumbar spine associated with a convex left scoliosis. No evidence of sacral nerve root encroachment. Mild muscular asymmetries as described." -Neurology recommended getting an MRI of the L-spine with contrast however she does not want to have a repeat MRI and only where she will get it done is under general anesthesia labs pending per neuro  -Urinalysis -B12 and folate level were normal -Thiamine levels pending -B6 levels, pending -Angiotensin-converting enzyme pending -Copper, pending -SSA SSB antibodies, pending -Antinuclear antibodies, pending -ANCA, pending -Copper levels in the serum still pending -Heavy metal screen -Hemoglobin A1c was 8.6 -Neuro to decide on IVIG after lab results and second opinion from outpatient neurologist and they have decided to initiate IVIG and she received a dose yesterday and will be getting another dose today -Obtain PT/OT and yesterday they recommended home health but were unable to work with the patient today given her pain -will give PRN morphine for any severe pain not controlled with her chronic pain regimen -Continue supportive care with normal saline at 75 MLS per hour -Continue with bowel regimen bisacodyl 5 mg p.o. daily as needed for mild constipation -Neurology now recommending obtaining a neurosurgery consultation for nerve muscle biopsy given Dr. Bess Harvest discussion with Dr. Daisy Blossom; Dr. Wilford Corner recommends obtaining a Right Quadirceps and Right Sural Nerve Bx  Chronic pain -Continue methadone 20 mg p.o. 3 times daily, nortriptyline 25 mg p.o. nightly, prn tizanidine dose p.o. every 6 hours as needed for muscle spasm -Continue judicious use of her pain medications and she follows with Dr. Whitney Post for pain -She is currently on IV morphine for breakthrough and getting 1 mg every 3 hours as needed we will titrate up to 1 mg every 2 hours as needed but  will not go above this given her chronic pain history  Uncontrolled Type 2 diabetes with hyperglycemia -Glucose of 325 with anion gap of 17, CO2 of 21 on admission: Metabolic acidosis now improved with a CO2 of 24, anion gap of 10, chloride level of 107 -She was given Will give one time dose of 10 units of Novolog and place on moderate SSI -Hemoglobin A1c is 8.6 -Continuous IV NS 75cc fluids -CBGs ranging from 131-300 -Holding home Metformin 1000 mg po BID and Dapagliflozin 1 tab po daily  -we will consult diabetes education coordinator for further evaluation recommendations -Diabetes education coordinator recommending considering 3 to 4 units of NovoLog 3 times daily with meal coverage with parameters of giving if she eats at least 50% of her meal and her glucose is at least 80 mg/dL  Chronic migraine -continue Tapentadol PRN  -Continue Topamax  Leukocytosis -Improved and patient's WBC went from 11.9 is now 6.8 -Continue to monitor and trend and repeat CBC in a.m.  HLD -Holding Atorvastatin 10 mg po qHS  Normocytic Anemia -Patient hemoglobin/hematocrit went from 13.6/43.7 and trended down to 12.1/38.8 and today is now 10.4/34.2 -Check anemia panel in the a.m. -Monitor for signs and symptoms of bleeding; currently no overt bleeding noted -Repeat CBC in a.m.  DVT prophylaxis: Enoxaparin 40 mg subcu every 24 Code Status: FULL CODE Family Communication: No family present at bedside  Disposition Plan: Pending further evaluation and clearance by neurology  Status is: Inpatient  Remains inpatient appropriate because:Unsafe d/c plan, IV treatments appropriate due to intensity of illness or inability to take PO and Inpatient level of care appropriate due to severity of illness   Dispo: The patient is from: Home              Anticipated d/c is to: TBD              Anticipated d/c date is: > 3 days              Patient currently is not medically stable to d/c.  Consultants:   Neurology Neurosurgery    Procedures:  None  Antimicrobials:  Anti-infectives (From admission, onward)   None     Subjective: Seen and examined at bedside and she had just woken from sleep and states that her pain was an 8 out of 10.  States that she slept okay.  No nausea or vomiting but then after IVIG she states that she had excruciating pain.  Continues to also complain of a migraine.  No other concerns or complaints at this time.  Objective: Vitals:   02/24/20 2348 02/25/20 0337 02/25/20 0440 02/25/20 0722  BP: 119/64 97/60 103/62 134/79  Pulse: 61 89 87 85  Resp: 19 17  18   Temp: 98.4 F (36.9 C) 98.2 F (36.8 C)  97.7 F (36.5 C)  TempSrc: Oral Oral  Oral  SpO2: 98% 99% 100% 99%  Weight:      Height:        Intake/Output Summary (Last 24 hours) at 02/25/2020 1210 Last data filed at 02/25/2020 02/27/2020 Gross per 24 hour  Intake 735.1 ml  Output 475 ml  Net 260.1 ml   Filed Weights   02/23/20 0915 02/24/20 2021  Weight: 61.7 kg 60 kg   Examination: Physical Exam:  Constitutional: Patient is a thin slightly ill-appearing Caucasian female currently in no acute distress she had just woken from sleep but still complaining some leg pain which is an 8 out of 10. Eyes: Lids and conjunctivae normal, sclerae anicteric  ENMT: External Ears, Nose appear normal. Grossly normal hearing.  Neck: Appears normal, supple, no cervical masses, normal ROM, no appreciable thyromegaly; no JVD Respiratory: Diminished to auscultation bilaterally, no wheezing, rales, rhonchi or crackles. Normal respiratory effort and patient is not tachypenic. No accessory muscle use.  Unlabored breathing Cardiovascular: RRR, no murmurs / rubs / gallops. S1 and S2 auscultated.  Trace extremity edema. Abdomen: Soft, non-tender, non-distended. Bowel sounds positive.  GU: Deferred. Musculoskeletal: No clubbing / cyanosis of digits/nails. No joint deformity upper and lower extremities.  Skin: No rashes,  lesions, ulcers on limited skin evaluation. No induration; Warm and dry.  Neurologic: CN 2-12 grossly intact with no focal deficits. Romberg sign and cerebellar reflexes not assessed.  Psychiatric: Normal judgment and insight. Alert and oriented x 3.  Slightly drowsy mood and appropriate affect.   Data Reviewed: I have personally reviewed following labs and imaging studies  CBC: Recent Labs  Lab 02/23/20 0944 02/24/20 0855 02/25/20 0416  WBC 11.9* 8.5 6.8  NEUTROABS  --  5.7 5.3  HGB 13.6 12.1 10.4*  HCT 43.7 38.8 34.2*  MCV 95.6 96.8 94.7  PLT 234 181 127*   Basic Metabolic Panel: Recent Labs  Lab 02/23/20 0944 02/24/20 0855 02/25/20 0416  NA 137 141 136  K 4.4 3.9 3.6  CL 99 107 106  CO2 21* 24 22  GLUCOSE 325* 97 157*  BUN 30* 23 17  CREATININE 0.94 0.86 0.81  CALCIUM 10.1 8.8* 8.2*  MG  --  2.1 1.9  PHOS  --  3.9 3.6   GFR: Estimated Creatinine Clearance: 58.2 mL/min (by C-G formula based on SCr of 0.81 mg/dL). Liver Function Tests: Recent Labs  Lab 02/23/20 0944 02/24/20 0855 02/25/20 0416  AST 18 12* 18  ALT ALKPHOS 144* 93 76  BILITOT 0.5 0.5 0.6  PROT 7.5 6.4* 6.2*  ALBUMIN 4.0 3.2* 2.6*   No results for input(s): LIPASE, AMYLASE in the last 168 hours. No results for input(s): AMMONIA in the last 168 hours. Coagulation Profile: Recent Labs  Lab 02/23/20 0944  INR 1.0   Cardiac Enzymes: No results for input(s): CKTOTAL, CKMB, CKMBINDEX, TROPONINI in the last 168 hours. BNP (last 3 results) No results for input(s): PROBNP in the last 8760 hours. HbA1C: Recent Labs    02/23/20 1957  HGBA1C 8.6*   CBG: Recent Labs  Lab 02/24/20 0039 02/24/20 0824 02/24/20 1327 02/24/20 1939 02/25/20 0629  GLUCAP 232* 80 300* 251* 131*   Lipid Profile: No results for input(s): CHOL, HDL, LDLCALC, TRIG, CHOLHDL, LDLDIRECT in the last 72 hours. Thyroid Function Tests: No results for input(s): TSH, T4TOTAL, FREET4, T3FREE, THYROIDAB in the  last 72 hours. Anemia Panel: Recent Labs    02/23/20 1957  VITAMINB12 600  FOLATE 45.0   Sepsis Labs: No results for input(s): PROCALCITON, LATICACIDVEN in the last 168 hours.  Recent Results (from the past 240 hour(s))  CSF culture     Status: None   Collection Time: 02/17/20 11:39 AM   Specimen: Lumbar Puncture; Cerebrospinal Fluid  Result Value Ref Range Status   MICRO NUMBER: [id number]  Final   SPECIMEN QUALITY: Adequate  Final   Source CEREBROSPINAL FLUID (CSF)  Final   STATUS: FINAL  Final   GRAM STAIN: No organisms or white blood cells seen  Final   Result: No Growth  Final  SARS Coronavirus 2 by RT PCR (hospital order, performed in [facility] hospital lab) Nasopharyngeal Nasopharyngeal Swab     Status: None   Collection Time: 02/23/20  8:07 PM   Specimen: Nasopharyngeal Swab  Result Value Ref Range Status   SARS Coronavirus 2 NEGATIVE NEGATIVE Final    Comment: (NOTE) SARS-CoV-2 target nucleic acids are NOT DETECTED.  The SARS-CoV-2 RNA is generally detectable in upper and lower respiratory specimens during the acute phase of infection. The lowest concentration of SARS-CoV-2 viral copies this assay can detect is 250 copies / mL. A negative result does not preclude SARS-CoV-2 infection and should not be used as the sole basis for treatment or other patient management decisions.  A negative result may occur with improper specimen collection / handling, submission of specimen other than nasopharyngeal swab, presence of viral mutation(s) within the areas targeted by this assay, and inadequate number of viral copies (<250 copies / mL). A negative result must be combined with clinical observations, patient history, and epidemiological information.  Fact Sheet for Patients:   BoilerBrush.com.cy  Fact Sheet for Healthcare Providers: https://pope.com/  This test is not yet approved or  cleared by the [place] FDA  and has been authorized for detection and/or diagnosis of SARS-CoV-2 by FDA under an Emergency Use Authorization (EUA).  This EUA will remain in effect (meaning this test can be used) for the duration of the COVID-19 declaration under Section 564(b)(1) of the Act, 21 U.S.C. section 360bbb-3(b)(1),  unless the authorization is terminated or revoked sooner.  Performed at [facility], [address], [place], [place] [address]     RN Pressure Injury Documentation:     Estimated body mass index is 22.71 kg/m as calculated from the following:   Height as of this encounter: 5\' 4"  (1.626 m).   Weight as of this encounter: 60 kg.  Malnutrition Type:      Malnutrition Characteristics:      Nutrition Interventions:     Radiology Studies: No results found.  Scheduled Meds: . enoxaparin (LOVENOX) injection  40 mg Subcutaneous Q24H  . feeding supplement (ENSURE ENLIVE)  237 mL Oral TID BM  . insulin aspart  0-15 Units Subcutaneous TID WC  . magnesium oxide  400 mg Oral BID  . methadone  20 mg Oral TID  . nortriptyline  25 mg Oral QHS  . pneumococcal 23 valent vaccine  0.5 mL Intramuscular Tomorrow-1000  . topiramate  100 mg Oral Daily  . topiramate  200 mg Oral QHS   Continuous Infusions: . sodium chloride 75 mL/hr at 02/25/20 0440  . Immune Globulin 10% Stopped (02/24/20 2239)     LOS: 2 days   02/26/20, DO [facility] PAGER is on AMION  If 7PM-7AM, please contact night-coverage [email or web address]

## 2020-02-25 NOTE — Consult Note (Signed)
Reason for Consult: lower extremity weakness and paresthesias Referring Physician: [facility]  Monica [name] is an 69 y.o. female.   HPI:  69 year old female presented to the hospital this week with progressive leg weakness and inretractable pain. She has had pain in both of her legs for the last 8 months. The strength in her legs has progessively gotten worse. She went from using a cane to a walker and then a wheelchair for the last week. She has had back surgery in the past by a surgeon out of town. She has been seeing Dr. Ophelia Charter for injections in her back and hips. These have not helped her much. She was then sent to Dr. Lucia Gaskins for further workup who ordered CT myelogram, EMGs, and referred her to Dr. Venetia Maxon. However, Dr. Fredrich Birks first available was not until august and her husband did not want to wait that long so he brought her to the hospital. She has been getting IVIG treatments which were started last night. She has right lower back pain that radiates into both legs down the anterior and posterior aspects with numbness and tingling in both legs and feet. Right leg is weaker than her left. Denies any bowel or bladder changes.   Past Medical History:  Diagnosis Date  . Arthritis   . Cervical facet joint syndrome 04/22/2012  . Cervicalgia   . Chronic headaches    Dr Hermelinda Medicus [place] [facility]ist  . Chronic nausea    normal GES   . Complication of anesthesia    had problem with local anesthesia-tried to assist Physician  . DM (diabetes mellitus) (HCC)   . Elevated liver enzymes    hx of, normal 03/2010  . GERD (gastroesophageal reflux disease) 05/31/2011   History Schatzki's ring, erosive reflux esophagitis, status post last EGD and dilation 04/05/10   . Glaucoma    lens in both eyes  . Hemorrhoids 11/17/08   Colonoscopy Dr Karilyn Cota  . Occipital neuralgia   . PTSD (post-traumatic stress disorder)   . Renal lithiasis   . 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 Torticollis   .  Unspecified musculoskeletal disorders and symptoms referable to neck    cervical/trapezius    Past Surgical History:  Procedure Laterality Date  . BREAST LUMPECTOMY     left  . CATARACT EXTRACTION W/PHACO Left 10/07/2016   Procedure: CATARACT EXTRACTION PHACO AND INTRAOCULAR LENS PLACEMENT (IOC);  Surgeon: Gemma Payor, MD;  Location: [facility];  Service: Ophthalmology;  Laterality: Left;  CDE: 4.98  . CATARACT EXTRACTION W/PHACO Right 10/21/2016   Procedure: CATARACT EXTRACTION PHACO AND INTRAOCULAR LENS PLACEMENT RIGHT EYE CDE - 6.24;  Surgeon: Gemma Payor, MD;  Location: [facility];  Service: Ophthalmology;  Laterality: Right;  right  . CHOLECYSTECTOMY    . COLONOSCOPY  11/17/08   ext hemorrhoids  . CRANIOTOMY     secondary TBI  . CYST EXCISION     left wrist  . ESOPHAGOGASTRODUODENOSCOPY  04/05/10   [name] (70F),erosive reflux esophagitis, small hiatal hernia,  . ESOPHAGOGASTRODUODENOSCOPY (EGD) WITH PROPOFOL N/A 08/30/2019   Procedure: ESOPHAGOGASTRODUODENOSCOPY (EGD) WITH PROPOFOL;  Surgeon: Corbin Ade, MD;  Location: [facility];  Service: Endoscopy;  Laterality: N/A;  3:30pm - pt knows to arrive at 9:45  . EXTRACORPOREAL SHOCK WAVE LITHOTRIPSY Left 12/13/2019   Procedure: EXTRACORPOREAL SHOCK WAVE LITHOTRIPSY (ESWL);  Surgeon: Jerilee Field, MD;  Location[facility];  Service: Urology;  Laterality: Left;  . FINGER  SURGERY     removal cyst left pinky  . gastro empy study  03/03/10   normal  . HAND SURGERY    . MALONEY DILATION N/A 08/30/2019   Procedure: Elease Hashimoto DILATION;  Surgeon: Corbin Ade, MD;  Location: [facility];  Service: Endoscopy;  Laterality: N/A;  . NECK SURGERY  1998  . NEPHROLITHOTOMY  05/13/2012   Procedure: NEPHROLITHOTOMY PERCUTANEOUS;  Surgeon: Marcine Matar, MD;  Location: [facility];  Service: Urology;  Laterality: Left;       Allergies  Allergen Reactions  . Aspirin Anaphylaxis and Hives    Slurred speech, blurred vision-  also  . Hydromorphone Hcl Shortness Of Breath and Other (See Comments)    Severe shortness of breath  . Vimpat [Lacosamide] Other (See Comments)    Hallucinations, out of body experience, confusion   . Depakote [Divalproex Sodium] Other (See Comments)    Reaction not recalled by husband  . Milnacipran Hcl Other (See Comments)    Dizziness   . Pristiq [Desvenlafaxine Succinate Monohydrate] Other (See Comments)    Dizziness   . Capsaicin Rash and Other (See Comments)    Red rash  . Gabapentin Other (See Comments)    States leg pain worsened, pt d/c'd    Social History   Tobacco Use  . Smoking status: Never Smoker  . Smokeless tobacco: Never Used  Substance Use Topics  . Alcohol use: No    Family History  Problem Relation Age of Onset  . Colon cancer Mother 42  . Heart disease Father   . Diabetes Father   . Diabetes Paternal Grandmother   . Heart disease Paternal Grandfather   . Diverticulitis Daughter      Review of Systems  Positive ROS: as above  All other systems have been reviewed and were otherwise negative with the exception of those mentioned in the HPI and as above.  Objective: Vital signs in last 24 hours: Temp:  [97.7 F (36.5 C)-99.4 F (37.4 C)] 98.4 F (36.9 C) (07/16 1432) Pulse Rate:  [61-98] 85 (07/16 1515) Resp:  [17-19] 18 (07/16 1515) BP: (97-141)/(59-79) 116/69 (07/16 1515) SpO2:  [96 %-100 %] 98 % (07/16 1515) Weight:  [60 kg] 60 kg (07/15 2021)  General Appearance: Alert, cooperative, no distress, appears stated age Head: Normocephalic, without obvious abnormality, atraumatic Eyes: PERRL, conjunctiva/corneas clear, EOM's intact, fundi benign, both eyes      Back: Symmetric, no curvature, ROM normal, no CVA tenderness Lungs: respirations unlabored Heart: Regular rate and rhythm Pulses: 2+ and symmetric all extremities Skin: Skin color, texture, turgor normal, no rashes or lesions  NEUROLOGIC:   Mental status: A&O x4, no aphasia, good  attention span, Memory and fund of knowledge Motor Exam - Right: dorsiflexion 3/5, plantar flexion 4/5, quad 0/5, hip flexor 0/5, Left: dorsiflexion 2/5, plantar flexion 4/5, quad 4/5, hip flexor 4-/5. Sensory Exam - grossly normal Reflexes: symmetric, no pathologic reflexes, No Hoffman's, No clonus Coordination - grossly normal Gait - unable to test Balance - unable to test Cranial Nerves: I: smell Not tested  II: visual acuity  OS: na    OD: na  II: visual fields Full to confrontation  II: pupils Equal, round, reactive to light  III,VII: ptosis None  III,IV,VI: extraocular muscles  Full ROM  V: mastication   V: facial light touch sensation    V,VII: corneal reflex    VII: facial muscle function - upper    VII: facial muscle function - lower   VIII:  hearing   IX: soft palate elevation    IX,X: gag reflex   XI: trapezius strength    XI: sternocleidomastoid strength   XI: neck flexion strength    XII: tongue strength      Data Review Lab Results  Component Value Date   WBC 6.8 02/25/2020   HGB 10.4 (L) 02/25/2020   HCT 34.2 (L) 02/25/2020   MCV 94.7 02/25/2020   PLT 127 (L) 02/25/2020   Lab Results  Component Value Date   NA 136 02/25/2020   K 3.6 02/25/2020   CL 106 02/25/2020   CO2 22 02/25/2020   BUN 17 02/25/2020   CREATININE 0.81 02/25/2020   GLUCOSE 157 (H) 02/25/2020   Lab Results  Component Value Date   INR 1.0 02/23/2020    Radiology: No results found.  Assessment/Plan: 69 year old female presented to the hospital this week with progressive lower extremity weakness and uncontrollable pain. [facility] requested a consult for a sural nerve biopsy after further discussion with neurology. I do not see anything on her ct myelogram that would be giving her these symptoms. We discussed this with the patient and her husband. We will make her npo after midnight Sunday night in preparation for a sural nerve biopsy.    Tiana Loft Victoria Euceda 02/25/2020  3:52 PM

## 2020-02-25 NOTE — Progress Notes (Signed)
Initial Nutrition Assessment  DOCUMENTATION CODES:   Non-severe (moderate) malnutrition in context of chronic illness  INTERVENTION:   - Remove Heart Healthy diet restriction, verbal with readback order placed per MD  - Ensure Enlive po TID, each supplement provides 350 kcal and 20 grams of protein  - Provided "High Calorie, High Protein Nutrition Therapy" handout and diet education  Pt with poor oral intake and would benefit from nutrient dense supplement. One Ensure Enlive supplement provides 350 kcals, 20 grams protein, and 44-45 grams of carbohydrate vs one Glucerna Shake supplement which provides 220 kcals, 10 grams of protein, and 26 grams of carbohydrate. Given pt's hx of DM, RD will continue to monitor PO intake, CBGs, and adjust supplement regimen as appropriate.  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  GOAL:   Patient will meet greater than or equal to 90% of their needs  MONITOR:   PO intake, Supplement acceptance, Labs, Weight trends  REASON FOR ASSESSMENT:   Malnutrition Screening Tool    ASSESSMENT:   69 year old female who presented to the ED for IVIG infusion. PMH of T2DM, chronic pain, PTSD, occipital neuralgia. Pt admitted with inflammatory polyradiculoneuropathy.   Noted plan for nerve muscle biopsy. Neurosurgery consulted.  Spoke with pt and husband at bedside. Noted untouched meal tray on sink. Pt reports that she was in too much pain this morning to eat. Pt states that when she is experiencing a lot of pain, she is unable to eat. Pt also reports that she is very picky and that if she has to eat something she doesn't like, she will spit it out. Pt reports that she really likes fruit (especially cantaloupe) and garden peas.  Pt reports that her husband  cooks 3 meals daily for her but that she "picks at" the food. Pt states that on a good day, she will eat some solid foods. On a bad day (a lot of pain), she may have only liquids (milk, soda, water, orange juice). Pt states that she has some vanilla Boost at home but rarely drinks them. She is willing to try vanilla Ensure Enlive during admission.  Pt reports decreased PO intake has been going on for about 7 months and is related to pain. Pt reports that her UBW is 142-146 lbs and that she has lost weight. Reviewed weight history in chart. Pt with a 4.2 kg weight loss since 10/29/19. This is a 6.5% weight loss in 4 months which is not quite significant for timeframe but is concerning.  Pt reports that she was having N/V but had an esophageal dilation in November 2020 which caused the N/V to resolve. Pt reports she periodically has nausea.  RD provided handouts regarding the importance of adequate kcal and protein intake and ways to increase kcal and protein without increasing volume. Pt and husband appreciative of information.  Discussed diet liberalization with MD who agreed. Verbal with readback order placed.  Medications reviewed and include: SSI, magnesium oxide, methadone, IVIG IVF: NS @ 75 ml/hr  Labs reviewed. CBG's: 131-300 x 24 hours  NUTRITION - FOCUSED PHYSICAL EXAM:    Most Recent Value  Orbital Region Moderate depletion  Upper Arm Region Moderate depletion  Thoracic and Lumbar Region Mild depletion  Buccal Region Mild depletion  Temple Region Mild depletion  Clavicle Bone Region Moderate depletion  Clavicle and Acromion Bone Region Moderate depletion  Scapular Bone Region Mild depletion  Dorsal Hand Moderate depletion  Patellar Region Severe depletion  Anterior Thigh Region Severe depletion  Posterior Calf Region Severe depletion  Edema (RD Assessment) None  Hair Reviewed  Eyes Reviewed  Mouth Reviewed  Skin Reviewed  Nails Reviewed       Diet Order:   Diet Order             Diet regular Room service appropriate? Yes; Fluid consistency: Thin  Diet effective now                 EDUCATION NEEDS:   Education needs have been addressed  Skin:  Skin Assessment: Reviewed RN Assessment  Last BM:  02/22/20  Height:   Ht Readings from Last 1 Encounters:  02/24/20 5\' 4"  (1.626 m)    Weight:   Wt Readings from Last 1 Encounters:  02/24/20 60 kg    Ideal Body Weight:  54.5 kg  BMI:  Body mass index is 22.71 kg/m.  Estimated Nutritional Needs:   Kcal:  1600-1800  Protein:  80-95 grams  Fluid:  1.6-1.8 L    02/26/20, MS, RD, LDN Inpatient Clinical Dietitian Please see [facility] for contact information.

## 2020-02-25 NOTE — Progress Notes (Signed)
Inpatient Diabetes Program Recommendations  AACE/ADA: New Consensus Statement on Inpatient Glycemic Control (2015)  Target Ranges:  Prepandial:   less than 140 mg/dL      Peak postprandial:   less than 180 mg/dL (1-2 hours)      Critically ill patients:  140 - 180 mg/dL   Lab Results  Component Value Date   GLUCAP 131 (H) 02/25/2020   HGBA1C 8.6 (H) 02/23/2020    Review of Glycemic Control Results for Monica [name], Monica [name] (MRN [id number]) as of 02/25/2020 10:22  Ref. Range 02/24/2020 08:24 02/24/2020 13:27 02/24/2020 19:39 02/25/2020 06:29  Glucose-Capillary Latest Ref Range: 70 - 99 mg/dL 80 937 (H)  Novolog 8 units 251 (H)  Novolog 8 units 131 (H)   Diabetes history: DM 2 Outpatient Diabetes medications: Farxiga 10 mg Daily, Metformin 1000 mg bid Current orders for Inpatient glycemic control:  Novolog 0-15 units tid  Inpatient Diabetes Program Recommendations:    Glucose trends increase after meals.   -Can consider Novolog 3-4 units tid meal coverage with parameters for pt to eat at least 50% of meal and glucose is at least 80 mg/dl.  Thanks,  Christena Deem RN, MSN, BC-ADM Inpatient Diabetes Coordinator Team Pager [phone] (8a-5p)

## 2020-02-26 DIAGNOSIS — E440 Moderate protein-calorie malnutrition: Secondary | ICD-10-CM | POA: Insufficient documentation

## 2020-02-26 LAB — CBC WITH DIFFERENTIAL/PLATELET
Abs Immature Granulocytes: 0.01 10*3/uL (ref 0.00–0.07)
Basophils Absolute: 0 10*3/uL (ref 0.0–0.1)
Basophils Relative: 1 %
Eosinophils Absolute: 0.1 10*3/uL (ref 0.0–0.5)
Eosinophils Relative: 3 %
HCT: 34.8 % — ABNORMAL LOW (ref 36.0–46.0)
Hemoglobin: 10.8 g/dL — ABNORMAL LOW (ref 12.0–15.0)
Immature Granulocytes: 0 %
Lymphocytes Relative: 29 %
Lymphs Abs: 1 10*3/uL (ref 0.7–4.0)
MCH: 29.1 pg (ref 26.0–34.0)
MCHC: 31 g/dL (ref 30.0–36.0)
MCV: 93.8 fL (ref 80.0–100.0)
Monocytes Absolute: 0.3 10*3/uL (ref 0.1–1.0)
Monocytes Relative: 7 %
Neutro Abs: 2.1 10*3/uL (ref 1.7–7.7)
Neutrophils Relative %: 60 %
Platelets: 139 10*3/uL — ABNORMAL LOW (ref 150–400)
RBC: 3.71 MIL/uL — ABNORMAL LOW (ref 3.87–5.11)
RDW: 13.6 % (ref 11.5–15.5)
WBC: 3.5 10*3/uL — ABNORMAL LOW (ref 4.0–10.5)
nRBC: 0 % (ref 0.0–0.2)

## 2020-02-26 LAB — COMPREHENSIVE METABOLIC PANEL
ALT: 17 U/L (ref 0–44)
AST: 15 U/L (ref 15–41)
Albumin: 2.7 g/dL — ABNORMAL LOW (ref 3.5–5.0)
Alkaline Phosphatase: 100 U/L (ref 38–126)
Anion gap: 9 (ref 5–15)
BUN: 15 mg/dL (ref 8–23)
CO2: 21 mmol/L — ABNORMAL LOW (ref 22–32)
Calcium: 8.5 mg/dL — ABNORMAL LOW (ref 8.9–10.3)
Chloride: 105 mmol/L (ref 98–111)
Creatinine, Ser: 0.84 mg/dL (ref 0.44–1.00)
GFR calc Af Amer: >60 mL/min (ref >60)
GFR calc non Af Amer: >60 mL/min (ref >60)
Glucose, Bld: 170 mg/dL — ABNORMAL HIGH (ref 70–99)
Potassium: 3.6 mmol/L (ref 3.5–5.1)
Sodium: 135 mmol/L (ref 135–145)
Total Bilirubin: 0.3 mg/dL (ref 0.3–1.2)
Total Protein: 6.9 g/dL (ref 6.5–8.1)

## 2020-02-26 LAB — GLUCOSE, CAPILLARY
Glucose-Capillary: 184 mg/dL — ABNORMAL HIGH (ref 70–99)
Glucose-Capillary: 227 mg/dL — ABNORMAL HIGH (ref 70–99)
Glucose-Capillary: 269 mg/dL — ABNORMAL HIGH (ref 70–99)
Glucose-Capillary: 188 mg/dL — ABNORMAL HIGH (ref 70–99)

## 2020-02-26 LAB — IRON AND TIBC
Iron: 56 ug/dL (ref 28–170)
Saturation Ratios: 18 % (ref 10.4–31.8)
TIBC: 316 ug/dL (ref 250–450)
UIBC: 260 ug/dL

## 2020-02-26 LAB — RETICULOCYTES
Immature Retic Fract: 9.8 % (ref 2.3–15.9)
RBC.: 3.73 MIL/uL — ABNORMAL LOW (ref 3.87–5.11)
Retic Count, Absolute: 65.3 10*3/uL (ref 19.0–186.0)
Retic Ct Pct: 1.8 % (ref 0.4–3.1)

## 2020-02-26 LAB — PHOSPHORUS: Phosphorus: 3.3 mg/dL (ref 2.5–4.6)

## 2020-02-26 LAB — MAGNESIUM: Magnesium: 1.9 mg/dL (ref 1.7–2.4)

## 2020-02-26 LAB — COPPER, SERUM: Copper: 106 ug/dL (ref 80–158)

## 2020-02-26 LAB — FOLATE: Folate: 35 ng/mL (ref >5.9)

## 2020-02-26 LAB — FERRITIN: Ferritin: 30 ng/mL (ref 11–307)

## 2020-02-26 MED ORDER — SENNOSIDES-DOCUSATE SODIUM 8.6-50 MG PO TABS
1.0000 | ORAL_TABLET | Freq: Two times a day (BID) | ORAL | Status: DC
  Administered 2020-02-26 – 2020-02-28 (×5): 1 via ORAL
  Filled 2020-02-26 (×5): qty 1

## 2020-02-26 MED ORDER — POLYETHYLENE GLYCOL 3350 17 G PO PACK
17.0000 g | PACK | Freq: Two times a day (BID) | ORAL | Status: DC
  Administered 2020-02-26 – 2020-02-29 (×6): 17 g via ORAL
  Filled 2020-02-26 (×10): qty 1

## 2020-02-26 MED ORDER — POTASSIUM CHLORIDE CRYS ER 20 MEQ PO TBCR
40.0000 meq | EXTENDED_RELEASE_TABLET | Freq: Once | ORAL | Status: AC
  Administered 2020-02-26: 40 meq via ORAL
  Filled 2020-02-26: qty 2

## 2020-02-26 NOTE — Plan of Care (Addendum)
Continue IVIG for a total of five doses. Appreciate neurosurgery consultation and scheduling for nerve muscle biopsy. We will follow  -- Milon Dikes, MD [facility] Pager: [phone] If 7pm to 7am, please call on call as listed on [facility]ION.

## 2020-02-26 NOTE — Progress Notes (Signed)
PT Cancellation Note  Patient Details Name: Monica [name] MRN: [id number] DOB: 1950-08-21   Cancelled Treatment:    Reason Eval/Treat Not Completed: Patient declined, no reason specified. Pt declined PT/OT sessions x3 today, citing pain each time despite RN premedication. On the final attempt, the pt reported that sitting up on the edge of the bed relieves her pain and stiffness due to the change in position, but continued to decline all offers to mobilize, citing significant pain in her RLE as the reason. Pt in no apparent distress during final attempt. PT will continue to follow as time/schedule allow.   [name], DPT   Acute Rehabilitation Department Pager #: [phone]    [name] 02/26/2020, 10:41 AM

## 2020-02-26 NOTE — Plan of Care (Signed)

## 2020-02-26 NOTE — Progress Notes (Signed)
OT Cancellation Note  Patient Details Name: Monica [name] MRN: [id number] DOB: May 03, 1951   Cancelled Treatment:    Reason Eval/Treat Not Completed: Other (comment), ensured pt receiving pain meds before checking in - checked on x2 as pt initially requesting to allow pain meds to kick in. During additional attempt pt continues to decline due to pain, despite best efforts/education on benefits of moving/repositioning. Pt in no apparent distress during attempt. Will follow up for OT eval as able.   Marcy Siren, OT Acute Rehabilitation Services Pager [phone] Office [phone]   Orlando Penner 02/26/2020, 10:41 AM

## 2020-02-26 NOTE — Progress Notes (Addendum)
PROGRESS NOTE    Monica [name]  AQT:[id number] DOB: 07-05-1951 DOA: 02/23/2020 PCP: [name], FNP   Brief Narrative: HPI per Dr. Benita Gutter on 02/23/20 Monica [name] is a 69 y.o. female with medical history significant for chronic migraine, type 2 diabetes, chronic pain on methadone, PTSD who presents with concerns of worsening lower leg pain.  Patient has been having bilateral lower leg pain for the past 8 months and was referred to neurology by her PMR physician. She has had extensive work-up including an MRI of the lumbar spine, EMG and an LP. EMG noted to have right L3 radiculopathy and right L5 radiculopathy.  LP noted to have few cells but significantly elevated CSF protein consistent with inflammatory polyradiculoneuropathy and IVIG was recommended. She was recently given a week of steroids at the beginning of July.  For the past 2 to 3 weeks her pain has acutely worsened and she has not been able to walk and now has been wheelchair-bound. She describes the pain as being sharp, electric-like with numbness and tingling of her bilateral feet.  It is worse when she sits down.   Her left leg is usually better than her right but recently it also started to have worsening symptoms.  She also feels as if her waistline is actually up at her lower ribs.  Also has been having decreased bowel movement since symptoms started 8 months ago and has only been able to have 1 bowel movement per month.  Her last bowel movement was 2 days ago after taking Dulcolax.  Denies any issues with urine output.  Denies any abdominal pain.  No nausea or vomiting.  She was sent to ED by outpatient neurology for IVIG due to progressively worsening symptoms.   ED Course: She was afebrile, normotensive and mildly tachycardic up to 130.  She has mild leukocytosis of 11.9 although recently was on prednisone.  Glucose of 325 with anion gap of 17, CO2 of 21.  **Interim History  Neurology evaluating and recommending  several laboratory studies and recommended L-spine MRI with contrast however she does not want to have this done.  Neurology is now starting the patient on IVIG daily empirically of 2 g/kg divided over 5 doses  02/25/20: Neurology recommending a possible nerve muscle biopsy and recommending continuing 5 days of IVIG and Dr. Wilford Corner recommends to contact Neurosurgery to schedule Bx. We will titrate up her morphine however will need to be judicious given that she is on chronic pain patient and already on methadone.  PT and OT unable to work with the patient again today given her pain.  Neurosurgery was consulted and they are planning for sural nerve biopsy on Monday and they have made the patient n.p.o. after midnight Sunday in preparation.  02/26/2020: Continues to complain of some morning pain and states that whenever she gets throughout the day she improves.  Has not had a bowel movement in several days now.  Understand that she will be going for biopsy on Monday for the right sural nerve  Assessment & Plan:   Principal Problem:   Inflammatory polyneuropathy (HCC) Active Problems:   Chronic pain   Uncontrolled type 2 diabetes mellitus with hyperglycemia, without long-term current use of insulin (HCC)   Migraine   Malnutrition of moderate degree  Inflammatory polyradiculoneuropathy -Differential diagnosis include idiopathic lumbosacral radicular plexus neuropathy/diabetic amyotrophy per neurology; feel it is less likely AIDP more likely to be idiopathic lumbosacral radicular plexus neuropathy but then they also feel  that other differentials or to consider nondiabetes related sensory or motor neuropathies -Recent lumbar CT spine showed "Mild curvature convex to the left. 2 mm chronic anterolisthesis L4-5. Previous posterior decompression and discectomy at L4-5. No compressive stenosis. Bilateral facet osteoarthritis. L5-S1 facet osteoarthritis with 2 mm of anterolisthesis. No compressive  stenosis. Mild non-compressive disc bulges at L2-3 and L3-4. Question if the nerve roots of the cauda equina are minimally larger than normal, some measuring up to 1-1.2 mm in diameter, raising the possibility of some neuropathic process. I do not think this potential finding is definitely valid." -MRI Pelvis on 02/18/20 showed "No acute findings or explanation for the patient's symptoms.  Mild degenerative and postsurgical changes within the lumbar spine associated with a convex left scoliosis. No evidence of sacral nerve root encroachment. Mild muscular asymmetries as described." -Neurology recommended getting an MRI of the L-spine with contrast however she does not want to have a repeat MRI and only where she will get it done is under general anesthesia labs pending per neuro  -Urinalysis -B12 and folate level were normal -Thiamine levels pending -B6 levels, pending -Angiotensin-converting enzyme 53 -Copper, was 106 -SSA SSB antibodie was less than 0.2 -Antinuclear antibodies were positive IFA and she had a homogeneous pattern 1:160 -ANCA negative -Copper levels in the serum still pending -Heavy metal screen was negative and had 5 of arsenic, less than 1 of lead, and less than 1.0 mercury -Hemoglobin A1c was 8.6 -Neuro to decide on IVIG after lab results and second opinion from outpatient neurologist and they have decided to initiate IVIG; and today is day 3 out of 5 of her IVIG -Obtain PT/OT and yesterday they recommended home health; they were unable to work with the patient again given her pain -will give PRN morphine for any severe pain not controlled with her chronic pain regimen -Continue supportive care with normal saline at 75 MLS per hour -Continue with bowel regimen bisacodyl 5 mg p.o. daily as needed for mild constipation -Neurology now recommending obtaining a neurosurgery consultation for nerve muscle biopsy given Dr. Bess Harvest discussion with Dr. Daisy Blossom; Dr. Wilford Corner recommends  obtaining a Right Quadirceps and Right Sural Nerve Bx -Neurosurgery consulted and they will do the right sural nerve biopsy on Monday  Chronic pain -Continue methadone 20 mg p.o. 3 times daily, nortriptyline 25 mg p.o. nightly, prn tizanidine dose p.o. every 6 hours as needed for muscle spasm -Continue judicious use of her pain medications and she follows with Dr. Whitney Post for pain -She is currently on IV morphine for breakthrough and getting 1 mg every 3 hours as needed we will titrate up to 1 mg every 2 hours as needed but will not go above this given her chronic pain history  Uncontrolled Type 2 diabetes with hyperglycemia -Glucose of 325 with anion gap of 17, CO2 of 21 on admission: Metabolic acidosis now improved with a CO2 of 24, anion gap of 10, chloride level of 107 -She was given Will give one time dose of 10 units of Novolog and place on moderate SSI -Hemoglobin A1c is 8.6 -Continuous IV NS 75cc fluids -CBGs ranging from 176-1 76-300 -Holding home Metformin 1000 mg po BID and Dapagliflozin 1 tab po daily  -we will consult diabetes education coordinator for further evaluation recommendations -Diabetes education coordinator recommending considering 3 to 4 units of NovoLog 3 times daily with meal coverage with parameters of giving if she eats at least 50% of her meal and her glucose is at least 80  mg/dL  Chronic migraine -continue Tapentadol PRN  -Continue Topamax  Leukocytosis -Improved and patient's WBC went from 11.9 is now 3.5 -Continue to monitor and trend and repeat CBC in a.m.  HLD -Holding Atorvastatin 10 mg po qHS  Normocytic Anemia -Patient hemoglobin/hematocrit went from 13.6/43.7 and trended down to 12.1/38.8 and today is now 10.4/34.2 -Check anemia panel and showed an iron level of 56, U IBC of 260, TIBC 316, saturation ratios of 18%, ferritin level of 30, folate of 35.0 and B12 was done earlier and was normal at 490 -Monitor for signs and symptoms of  bleeding; currently no overt bleeding noted -Repeat CBC in a.m.  Constipation -Likely opiate induced -Started senna docusate 1 tab p.o. twice daily, MiraLAX 17 g p.o. twice daily, -we will continue bisacodyl 5 mg p.o. daily as needed for mild constipation and may need a bisacodyl suppository -Continue to monitor and if not improving may try enemas  Metabolic Acidosis -Patient CO2 is 21, chloride level is 105, anion gap is 9 -Continue monitor and trend and she is currently getting IV fluid resuscitation and will continue with 75 mils per hour and will reduce to 50 mils per hour in a.m. -Repeat CMP in a.m.  Malnutrition of moderate degree/nonsevere malnutrition in the context of chronic illness -Dietitian was consulted for further evaluation recommendations and we remove the heart healthy diet restriction and she has been started on Ensure Enlive p.o. 3 times daily and she is also provided with a "high-calorie, high-protein nutrition therapy" handout   DVT prophylaxis: Enoxaparin 40 mg subcu every 24 Code Status: FULL CODE Family Communication: No family present at bedside  Disposition Plan: Pending further evaluation and clearance by neurology  Status is: Inpatient  Remains inpatient appropriate because:Unsafe d/c plan, IV treatments appropriate due to intensity of illness or inability to take PO and Inpatient level of care appropriate due to severity of illness   Dispo: The patient is from: Home              Anticipated d/c is to: TBD              Anticipated d/c date is: 3 days              Patient currently is not medically stable to d/c.  Consultants:  Neurology Neurosurgery    Procedures:  None  Antimicrobials:  Anti-infectives (From admission, onward)   None     Subjective: Seen and examined at bedside and she is still complaining of some pain in the morning and states that it is worse in the mornings when she first wakes up.  States that she feels stiff and very  hard to move.  Also has very painful extremities on palpation.  No nausea or vomiting.  Has not had bowel movement in about 4 days.  No other concerns or complaints at this time.  Objective: Vitals:   02/25/20 2355 02/26/20 0349 02/26/20 0812 02/26/20 1127  BP: 125/72 138/80 (!) 150/92 124/68  Pulse: 86 77 90 74  Resp: 18 17 20 16   Temp: 98.5 F (36.9 C) 98.1 F (36.7 C) 99.7 F (37.6 C) 98.5 F (36.9 C)  TempSrc: Oral Oral Oral Oral  SpO2: 98% 98% 98% 99%  Weight:      Height:        Intake/Output Summary (Last 24 hours) at 02/26/2020 1330 Last data filed at 02/26/2020 0555 Gross per 24 hour  Intake 1023.85 ml  Output --  Net 1023.85 ml  Filed Weights   02/23/20 0915 02/24/20 2021  Weight: 61.7 kg 60 kg   Examination: Physical Exam:  Constitutional: Patient is a thin slightly ill-appearing Caucasian female currently in no acute distress but still complaining of some pain and does appear little uncomfortable Eyes: Lids and conjunctivae normal, sclerae anicteric  ENMT: External Ears, Nose appear normal. Grossly normal hearing.  Neck: Appears normal, supple, no cervical masses, normal ROM, no appreciable thyromegaly; no JVD Respiratory: Diminished to auscultation bilaterally, no wheezing, rales, rhonchi or crackles. Normal respiratory effort and patient is not tachypenic. No accessory muscle use.  Unlabored breathing Cardiovascular: RRR, no murmurs / rubs / gallops. S1 and S2 auscultated.  Trace extremity edema Abdomen: Soft, non-tender, non-distended. Bowel sounds positive.  GU: Deferred. Musculoskeletal: No clubbing / cyanosis of digits/nails. No joint deformity upper and lower extremities but she is extremely painful extremities to palpation Skin: No rashes, lesions, ulcers on skin on a limited evaluation. No induration; Warm and dry.  Neurologic: CN 2-12 grossly intact with no focal deficits. Romberg sign and cerebellar reflexes not assessed.  Psychiatric: Normal judgment  and insight. Alert and oriented x 3. Normal mood and appropriate affect.   Data Reviewed: I have personally reviewed following labs and imaging studies  CBC: Recent Labs  Lab 02/23/20 0944 02/24/20 0855 02/25/20 0416 02/26/20 0741  WBC 11.9* 8.5 6.8 3.5*  NEUTROABS  --  5.7 5.3 2.1  HGB 13.6 12.1 10.4* 10.8*  HCT 43.7 38.8 34.2* 34.8*  MCV 95.6 96.8 94.7 93.8  PLT 234 181 127* 139*   Basic Metabolic Panel: Recent Labs  Lab 02/23/20 0944 02/24/20 0855 02/25/20 0416 02/26/20 0741  NA 137 141 136 135  K 4.4 3.9 3.6 3.6  CL 99 107 106 105  CO2 21* 24 22 21*  GLUCOSE 325* 97 157* 170*  BUN 30* CREATININE 0.94 0.86 0.81 0.84  CALCIUM 10.1 8.8* 8.2* 8.5*  MG  --  2.1 1.9 1.9  PHOS  --  3.9 3.6 3.3   GFR: Estimated Creatinine Clearance: 56.1 mL/min (by C-G formula based on SCr of 0.84 mg/dL). Liver Function Tests: Recent Labs  Lab 02/23/20 0944 02/24/20 0855 02/25/20 0416 02/26/20 0741  AST 18 12* 18 15  ALT ALKPHOS 144* 93 76 100  BILITOT 0.5 0.5 0.6 0.3  PROT 7.5 6.4* 6.2* 6.9  ALBUMIN 4.0 3.2* 2.6* 2.7*   No results for input(s): LIPASE, AMYLASE in the last 168 hours. No results for input(s): AMMONIA in the last 168 hours. Coagulation Profile: Recent Labs  Lab 02/23/20 0944  INR 1.0   Cardiac Enzymes: No results for input(s): CKTOTAL, CKMB, CKMBINDEX, TROPONINI in the last 168 hours. BNP (last 3 results) No results for input(s): PROBNP in the last 8760 hours. HbA1C: Recent Labs    02/23/20 1957  HGBA1C 8.6*   CBG: Recent Labs  Lab 02/25/20 1325 02/25/20 1627 02/25/20 2116 02/26/20 0607 02/26/20 1126  GLUCAP 176* 195* 300* 184* 227*   Lipid Profile: No results for input(s): CHOL, HDL, LDLCALC, TRIG, CHOLHDL, LDLDIRECT in the last 72 hours. Thyroid Function Tests: No results for input(s): TSH, T4TOTAL, FREET4, T3FREE, THYROIDAB in the last 72 hours. Anemia Panel: Recent Labs    02/23/20 1957 02/25/20 1311  02/26/20 0741  VITAMINB12 600 490  --   FOLATE 45.0  --  35.0  FERRITIN  --   --  30  TIBC  --   --  316  IRON  --   --  56  RETICCTPCT  --   --  1.8   Sepsis Labs: No results for input(s): PROCALCITON, LATICACIDVEN in the last 168 hours.  Recent Results (from the past 240 hour(s))  CSF culture     Status: None   Collection Time: 02/17/20 11:39 AM   Specimen: Lumbar Puncture; Cerebrospinal Fluid  Result Value Ref Range Status   MICRO NUMBER: [id number]  Final   SPECIMEN QUALITY: Adequate  Final   Source CEREBROSPINAL FLUID (CSF)  Final   STATUS: FINAL  Final   GRAM STAIN: No organisms or white blood cells seen  Final   Result: No Growth  Final  SARS Coronavirus 2 by RT PCR (hospital order, performed [facility]) Nasopharyngeal Nasopharyngeal Swab     Status: None   Collection Time: 02/23/20  8:07 PM   Specimen: Nasopharyngeal Swab  Result Value Ref Range Status   SARS Coronavirus 2 NEGATIVE NEGATIVE Final    Comment: (NOTE) SARS-CoV-2 target nucleic acids are NOT DETECTED.  The SARS-CoV-2 RNA is generally detectable in upper and lower respiratory specimens during the acute phase of infection. The lowest concentration of SARS-CoV-2 viral copies this assay can detect is 250 copies / mL. A negative result does not preclude SARS-CoV-2 infection and should not be used as the sole basis for treatment or other patient management decisions.  A negative result may occur with improper specimen collection / handling, submission of specimen other than nasopharyngeal swab, presence of viral mutation(s) within the areas targeted by this assay, and inadequate number of viral copies (<250 copies / mL). A negative result must be combined with clinical observations, patient history, and epidemiological information.  Fact Sheet for Patients:   BoilerBrush.com.cyhttps://www.fda.gov/media/136312/download  Fact Sheet for Healthcare Providers: https://pope.com/https://www.fda.gov/media/136313/download  This  test is not yet approved or  cleared by the [place] FDA and has been authorized for detection and/or diagnosis of SARS-CoV-2 by FDA under an Emergency Use Authorization (EUA).  This EUA will remain in effect (meaning this test can be used) for the duration of the COVID-19 declaration under Section 564(b)(1) of the Act, 21 U.S.C. section 360bbb-3(b)(1), unless the authorization is terminated or revoked sooner.  Performed at [facility], [name] [place], [place] [address]     RN Pressure Injury Documentation:     Estimated body mass index is 22.71 kg/m as calculated from the following:   Height as of this encounter: 5\' 4"  (1.626 m).   Weight as of this encounter: 60 kg.  Malnutrition Type:  Nutrition Problem: Moderate Malnutrition Etiology: chronic illness (inflammatory polyradiculoneuropathy)   Malnutrition Characteristics:  Signs/Symptoms: mild fat depletion, moderate fat depletion, mild muscle depletion, moderate muscle depletion, severe muscle depletion   Nutrition Interventions:  Interventions: Ensure Enlive (each supplement provides 350kcal and 20 grams of protein), Liberalize Diet, Education  Radiology Studies: No results found.  Scheduled Meds: . enoxaparin (LOVENOX) injection  40 mg Subcutaneous Q24H  . feeding supplement (ENSURE ENLIVE)  237 mL Oral TID BM  . insulin aspart  0-15 Units Subcutaneous TID AC & HS  . insulin aspart  3 Units Subcutaneous TID WC  . magnesium oxide  400 mg Oral BID  . methadone  20 mg Oral TID  . nortriptyline  25 mg Oral QHS  . pneumococcal 23 valent vaccine  0.5 mL Intramuscular Tomorrow-1000  . polyethylene glycol  17 g Oral BID  . senna-docusate  1 tablet Oral BID  . topiramate  100 mg Oral Daily  .  topiramate  200 mg Oral QHS   Continuous Infusions: . sodium chloride 75 mL/hr at 02/26/20 0555  . Immune Globulin 10% 25 g (02/25/20 1439)     LOS: 3 days   Merlene Laughter, DO [facility] PAGER is on AMION  If 7PM-7AM, please contact night-coverage [email or web address]

## 2020-02-27 LAB — COMPREHENSIVE METABOLIC PANEL
ALT: 17 U/L (ref 0–44)
AST: 20 U/L (ref 15–41)
Albumin: 2.6 g/dL — ABNORMAL LOW (ref 3.5–5.0)
Alkaline Phosphatase: 95 U/L (ref 38–126)
Anion gap: 7 (ref 5–15)
BUN: 13 mg/dL (ref 8–23)
CO2: 24 mmol/L (ref 22–32)
Calcium: 8.6 mg/dL — ABNORMAL LOW (ref 8.9–10.3)
Chloride: 104 mmol/L (ref 98–111)
Creatinine, Ser: 0.8 mg/dL (ref 0.44–1.00)
GFR calc Af Amer: >60 mL/min (ref >60)
GFR calc non Af Amer: >60 mL/min (ref >60)
Glucose, Bld: 216 mg/dL — ABNORMAL HIGH (ref 70–99)
Potassium: 3.9 mmol/L (ref 3.5–5.1)
Sodium: 135 mmol/L (ref 135–145)
Total Bilirubin: <0.1 mg/dL — ABNORMAL LOW (ref 0.3–1.2)
Total Protein: 7.1 g/dL (ref 6.5–8.1)

## 2020-02-27 LAB — CBC WITH DIFFERENTIAL/PLATELET
Abs Immature Granulocytes: 0.02 10*3/uL (ref 0.00–0.07)
Basophils Absolute: 0 10*3/uL (ref 0.0–0.1)
Basophils Relative: 1 %
Eosinophils Absolute: 0.1 10*3/uL (ref 0.0–0.5)
Eosinophils Relative: 3 %
HCT: 32.9 % — ABNORMAL LOW (ref 36.0–46.0)
Hemoglobin: 10.4 g/dL — ABNORMAL LOW (ref 12.0–15.0)
Immature Granulocytes: 1 %
Lymphocytes Relative: 26 %
Lymphs Abs: 1.1 10*3/uL (ref 0.7–4.0)
MCH: 29.8 pg (ref 26.0–34.0)
MCHC: 31.6 g/dL (ref 30.0–36.0)
MCV: 94.3 fL (ref 80.0–100.0)
Monocytes Absolute: 0.4 10*3/uL (ref 0.1–1.0)
Monocytes Relative: 9 %
Neutro Abs: 2.4 10*3/uL (ref 1.7–7.7)
Neutrophils Relative %: 60 %
Platelets: 125 10*3/uL — ABNORMAL LOW (ref 150–400)
RBC: 3.49 MIL/uL — ABNORMAL LOW (ref 3.87–5.11)
RDW: 13.7 % (ref 11.5–15.5)
WBC: 4 10*3/uL (ref 4.0–10.5)
nRBC: 0 % (ref 0.0–0.2)

## 2020-02-27 LAB — GLUCOSE, CAPILLARY
Glucose-Capillary: 196 mg/dL — ABNORMAL HIGH (ref 70–99)
Glucose-Capillary: 292 mg/dL — ABNORMAL HIGH (ref 70–99)
Glucose-Capillary: 211 mg/dL — ABNORMAL HIGH (ref 70–99)
Glucose-Capillary: 214 mg/dL — ABNORMAL HIGH (ref 70–99)

## 2020-02-27 LAB — PHOSPHORUS: Phosphorus: 3.5 mg/dL (ref 2.5–4.6)

## 2020-02-27 LAB — MAGNESIUM: Magnesium: 1.9 mg/dL (ref 1.7–2.4)

## 2020-02-27 MED ORDER — INSULIN DETEMIR 100 UNIT/ML ~~LOC~~ SOLN
5.0000 [IU] | Freq: Every day | SUBCUTANEOUS | Status: DC
  Administered 2020-02-27 – 2020-02-28 (×2): 5 [IU] via SUBCUTANEOUS
  Filled 2020-02-27 (×3): qty 0.05

## 2020-02-27 NOTE — Progress Notes (Signed)
Spoke with Dr. Marland Mcalpine regarding blood glucose readings for patient.  Diabetes coordinator evaluated patient 7/16 and recommended addition of 3-4 units of insulin at mealtime.    Glucose consistently elevated > 200 after addition of mealtime coverage.  After conversation with Dr. Marland Mcalpine, will add Levemir 5 units daily.  Rexford Maus, PharmD PGY-1 Acute Care Pharmacy Resident Office: [phone] 02/27/2020 12:40 PM

## 2020-02-27 NOTE — Evaluation (Signed)
Occupational Therapy Evaluation Patient Details Name: Monica [name] MRN: [id number] DOB: 1950/12/27 Today's Date: 02/27/2020    History of Present Illness Monica [name] is a 69 y.o. female s/p concerns of worsening lower leg pain and inability to walk. 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 with medical history significant for chronic migraine, type 2 diabetes, chronic pain on methadone, PTSD    Clinical Impression   Pt PTA: Pt living with spouse and reports independence with grooming and BADL, but requires assist for LB ADL and mobility as needed. Pt was ambulating with no AD or SPC at times; recently pt was unable to walk without assist. Pt currently,  limited by decreased strength, decreased activity tolerance, increased pain and decreased ability to care for self. Pt minA to modA overal for ADL and minA +2 for mobility and bed mobility. Pt's RLE pain decreases at EOB and with movement. Pt would benefit from continued OT skilled services. OT following acutely.      Follow Up Recommendations  SNF;Home health OT;Supervision/Assistance - 24 hour    Equipment Recommendations  3 in 1 bedside commode    Recommendations for Other Services       Precautions / Restrictions Precautions Precautions: Fall Precaution Comments: weak and unsteady RLE Restrictions Weight Bearing Restrictions: No      Mobility Bed Mobility Overal bed mobility: Needs Assistance Bed Mobility: Supine to Sit;Sit to Supine     Supine to sit: Min assist;HOB elevated Sit to supine: Min assist;HOB elevated   General bed mobility comments: Assist with RLE to maneuver back onto bed  Transfers Overall transfer level: Needs assistance Equipment used: Rolling walker (2 wheeled) Transfers: Sit to/from [employer] Sit to Stand: Min assist;+2 physical assistance;+2 safety/equipment;From elevated surface Stand pivot transfers:  Min assist;+2 physical assistance;+2 safety/equipment;From elevated surface       General transfer comment: from elevated surface- mostly assist on R side for power-up    Balance Overall balance assessment: Needs assistance Sitting-balance support: Feet supported Sitting balance-Leahy Scale: Fair Sitting balance - Comments: pain decreases when sitting EOB   Standing balance support: Bilateral upper extremity supported;During functional activity Standing balance-Leahy Scale: Poor                             ADL either performed or assessed with clinical judgement   ADL Overall ADL's : Needs assistance/impaired Eating/Feeding: Set up;Sitting;Bed level   Grooming: Set up;Sitting;Bed level   Upper Body Bathing: Minimal assistance;Sitting;Bed level   Lower Body Bathing: Moderate assistance;Cueing for safety;Sitting/lateral leans;Sit to/from stand   Upper Body Dressing : Minimal assistance;Sitting;Bed level   Lower Body Dressing: Moderate assistance;Sitting/lateral leans;Bed level   Toilet Transfer: Minimal assistance;+2 for physical assistance;+2 for safety/equipment;RW;Stand-pivot Toilet Transfer Details (indicate cue type and reason): taking a few steps toward [facility] Toileting- Clothing Manipulation and Hygiene: Moderate assistance;+2 for physical assistance;+2 for safety/equipment;Sitting/lateral lean       Functional mobility during ADLs: Minimal assistance;+2 for physical assistance;Rolling walker;Cueing for safety General ADL Comments: Pt limited by decreased strength, decreased activity tolerance, increased pain and decreased ability to care for self.     Vision Baseline Vision/History: No visual deficits Patient Visual Report: No change from baseline Vision Assessment?: No apparent visual deficits     Perception     Praxis      Pertinent Vitals/Pain Pain Assessment: Faces Faces Pain Scale: Hurts little more Pain Location: low back  and BLEs Pain  Descriptors / Indicators: Discomfort;Constant;Shooting;Sore Pain Intervention(s): Monitored during session;Repositioned     Hand Dominance Right   Extremity/Trunk Assessment Upper Extremity Assessment Upper Extremity Assessment: Generalized weakness   Lower Extremity Assessment Lower Extremity Assessment: Generalized weakness;Defer to PT evaluation RLE Deficits / Details: weakness noted, decreased ROM RLE Coordination: decreased fine motor;decreased gross motor   Cervical / Trunk Assessment Cervical / Trunk Assessment: Normal   Communication Communication Communication: No difficulties   Cognition Arousal/Alertness: Awake/alert Behavior During Therapy: WFL for tasks assessed/performed Overall Cognitive Status: Within Functional Limits for tasks assessed                                     General Comments  Spouse in room. VSS.    Exercises     Shoulder Instructions      Home Living Family/patient expects to be discharged to:: Private residence Living Arrangements: Spouse/significant other Available Help at Discharge: Family;Available 24 hours/day Type of Home: House Home Access: Stairs to enter [employer] of Steps: 5 on side. 4 in front Entrance Stairs-Rails: Right Home Layout: Multi-level;Able to live on main level with bedroom/bathroom Alternate Level Stairs-Number of Steps: ~12 Alternate Level Stairs-Rails: Left Bathroom Shower/Tub: Tub/shower unit;Walk-in shower   Bathroom Toilet: Handicapped height     Home Equipment: Environmental consultant - 2 wheels;Cane - single point          Prior Functioning/Environment Level of Independence: Needs assistance  Gait / Transfers Assistance Needed: still managing stairs >3 weeks ago with [facility]; assisted with transfers ADL's / Homemaking Assistance Needed: husband assists with bathing and dressing. Pt stands in shower- independent. Husband completes all iADLs.   Comments: Plans to use downnstairs walk in  shower        OT Problem List: Decreased strength;Decreased activity tolerance;Impaired balance (sitting and/or standing);Decreased coordination;Decreased safety awareness;Pain;Increased edema;Decreased knowledge of use of DME or AE      OT Treatment/Interventions: Self-care/ADL training;Therapeutic exercise;Energy conservation;DME and/or AE instruction;Therapeutic activities;Patient/family education;Balance training    OT Goals(Current goals can be found in the care plan section) Acute Rehab OT Goals Patient Stated Goal: to return home with spouse OT Goal Formulation: With patient Time For Goal Achievement: 03/12/20 Potential to Achieve Goals: Good ADL Goals Pt Will Perform Grooming: with supervision;standing Pt Will Perform Lower Body Dressing: with supervision;sitting/lateral leans;sit to/from stand Pt Will Transfer to Toilet: with min guard assist;ambulating Pt/caregiver will Perform Home Exercise Program: Increased strength;Both right and left upper extremity;With Supervision Additional ADL Goal #1: Pt will increase to supervisionA for all ADL functional transfers and ADL mobility as precursor for ADLs in standing.  OT Frequency: Min 2X/week   Barriers to D/C:            Co-evaluation              AM-PAC OT "6 Clicks" Daily Activity     Outcome Measure Help from another person eating meals?: None Help from another person taking care of personal grooming?: A Little Help from another person toileting, which includes using toliet, bedpan, or urinal?: A Lot Help from another person bathing (including washing, rinsing, drying)?: A Lot Help from another person to put on and taking off regular upper body clothing?: A Little Help from another person to put on and taking off regular lower body clothing?: A Lot 6 Click Score: 16   End of Session Equipment Utilized During Treatment: Gait belt;Rolling walker Nurse Communication:  Mobility status  Activity Tolerance: Patient  limited by fatigue;Patient limited by lethargy;Patient limited by pain Patient left: in bed;with call bell/phone within reach;with bed alarm set;with family/visitor present  OT Visit Diagnosis: Unsteadiness on feet (R26.81);Muscle weakness (generalized) (M62.81);Pain Pain - Right/Left: Right Pain - part of body: Leg                Time: 5056-9794 OT Time Calculation (min): 33 min Charges:  OT General Charges $OT Visit: 1 Visit OT Evaluation $OT Eval Moderate Complexity: 1 Mod OT Treatments $Self Care/Home Management : 8-22 mins  Flora Lipps, OTR/L Acute Rehabilitation Services Pager: [phone] Office: [phone]   Nathifa Ritthaler C 02/27/2020, 3:54 PM

## 2020-02-27 NOTE — Progress Notes (Signed)
PROGRESS NOTE    Monica [name]  IOM:[id number] DOB: 09-19-1950 DOA: 02/23/2020 PCP: [name], FNP   Brief Narrative: HPI per Dr. Benita Gutter on 02/23/20 Monica [name] is a 69 y.o. female with medical history significant for chronic migraine, type 2 diabetes, chronic pain on methadone, PTSD who presents with concerns of worsening lower leg pain.  Patient has been having bilateral lower leg pain for the past 8 months and was referred to neurology by her PMR physician. She has had extensive work-up including an MRI of the lumbar spine, EMG and an LP. EMG noted to have right L3 radiculopathy and right L5 radiculopathy.  LP noted to have few cells but significantly elevated CSF protein consistent with inflammatory polyradiculoneuropathy and IVIG was recommended. She was recently given a week of steroids at the beginning of July.  For the past 2 to 3 weeks her pain has acutely worsened and she has not been able to walk and now has been wheelchair-bound. She describes the pain as being sharp, electric-like with numbness and tingling of her bilateral feet.  It is worse when she sits down.   Her left leg is usually better than her right but recently it also started to have worsening symptoms.  She also feels as if her waistline is actually up at her lower ribs.  Also has been having decreased bowel movement since symptoms started 8 months ago and has only been able to have 1 bowel movement per month.  Her last bowel movement was 2 days ago after taking Dulcolax.  Denies any issues with urine output.  Denies any abdominal pain.  No nausea or vomiting.  She was sent to ED by outpatient neurology for IVIG due to progressively worsening symptoms.   ED Course: She was afebrile, normotensive and mildly tachycardic up to 130.  She has mild leukocytosis of 11.9 although recently was on prednisone.  Glucose of 325 with anion gap of 17, CO2 of 21.  **Interim History  Neurology evaluating and recommending  several laboratory studies and recommended L-spine MRI with contrast however she does not want to have this done.  Neurology is now starting the patient on IVIG daily empirically of 2 g/kg divided over 5 doses  02/25/20: Neurology recommending a possible nerve muscle biopsy and recommending continuing 5 days of IVIG and Dr. Wilford Corner recommends to contact Neurosurgery to schedule Bx. We will titrate up her morphine however will need to be judicious given that she is on chronic pain patient and already on methadone.  PT and OT unable to work with the patient again today given her pain.  Neurosurgery was consulted and they are planning for sural nerve biopsy on Monday and they have made the patient n.p.o. after midnight Sunday in preparation.  02/26/2020: Continues to complain of some morning pain and states that whenever she gets throughout the day she improves.  Has not had a bowel movement in several days now.  Understand that she will be going for biopsy on Monday for the right sural nerve  02/27/20: Continues to complain of some pain and ready to get her biopsy over with.  Still not had a bowel movement but states that this happens to her frequently and that she can go a week without having a bowel movement usually.  Labs are relatively stable and her acidosis is improved.  Assessment & Plan:   Principal Problem:   Inflammatory polyneuropathy (HCC) Active Problems:   Chronic pain   Uncontrolled type 2  diabetes mellitus with hyperglycemia, without long-term current use of insulin (HCC)   Migraine   Malnutrition of moderate degree  Inflammatory polyradiculoneuropathy -Differential diagnosis include idiopathic lumbosacral radicular plexus neuropathy/diabetic amyotrophy per neurology; feel it is less likely AIDP more likely to be idiopathic lumbosacral radicular plexus neuropathy but then they also feel that other differentials or to consider nondiabetes related sensory or motor neuropathies -Recent  lumbar CT spine showed "Mild curvature convex to the left. 2 mm chronic anterolisthesis L4-5. Previous posterior decompression and discectomy at L4-5. No compressive stenosis. Bilateral facet osteoarthritis. L5-S1 facet osteoarthritis with 2 mm of anterolisthesis. No compressive stenosis. Mild non-compressive disc bulges at L2-3 and L3-4. Question if the nerve roots of the cauda equina are minimally larger than normal, some measuring up to 1-1.2 mm in diameter, raising the possibility of some neuropathic process. I do not think this potential finding is definitely valid." -MRI Pelvis on 02/18/20 showed "No acute findings or explanation for the patient's symptoms.  Mild degenerative and postsurgical changes within the lumbar spine associated with a convex left scoliosis. No evidence of sacral nerve root encroachment. Mild muscular asymmetries as described." -Neurology recommended getting an MRI of the L-spine with contrast however she does not want to have a repeat MRI and only where she will get it done is under general anesthesia labs pending per neuro  -Urinalysis -B12 and folate level were normal -Thiamine levels pending -B6 levels, pending -Angiotensin-converting enzyme 53 -Copper, was 106 -SSA SSB antibodie was less than 0.2 -Antinuclear antibodies were positive IFA and she had a homogeneous pattern 1:160 -ANCA negative -Copper levels in the serum still pending -Heavy metal screen was negative and had 5 of arsenic, less than 1 of lead, and less than 1.0 mercury -Hemoglobin A1c was 8.6 -Neuro to decide on IVIG after lab results and second opinion from outpatient neurologist and they have decided to initiate IVIG; and today is day 4 out of 5 of her IVIG -Obtain PT/OT and they initially recommended home health; they were unable to work with the patient again given her pain yesterday and will try again today -will give PRN morphine for any severe pain not controlled with her chronic pain  regimen -Continue supportive care with normal saline at 75 MLS per hour but will stop now -Continue with bowel regimen bisacodyl 5 mg p.o. daily as needed for mild constipation -Neurology now recommending obtaining a neurosurgery consultation for nerve muscle biopsy given Dr. Bess Harvest discussion with Dr. Daisy Blossom; Dr. Wilford Corner recommends obtaining a Right Quadirceps and Right Sural Nerve Bx -Neurosurgery consulted and they will do the right sural nerve biopsy on Monday unsure what time tomorrow  Chronic pain -Continue methadone 20 mg p.o. 3 times daily, nortriptyline 25 mg p.o. nightly, prn tizanidine dose p.o. every 6 hours as needed for muscle spasm -Continue judicious use of her pain medications and she follows with Dr. Whitney Post for pain -She is currently on IV morphine for breakthrough and getting 1 mg every 3 hours as needed we will titrate up to 1 mg every 2 hours as needed but will not go above this given her chronic pain history  Uncontrolled Type 2 diabetes with hyperglycemia -Glucose of 325 with anion gap of 17, CO2 of 21 on admission: Metabolic acidosis now improved with a CO2 of 24, anion gap of 10, chloride level of 107 -She was given Will give one time dose of 10 units of Novolog and place on moderate SSI -Hemoglobin A1c is 8.6 -Continuous IV NS  75cc fluids will now be stopped -CBGs ranging from 188-292 -Holding home Metformin 1000 mg po BID and Dapagliflozin 1 tab po daily  -we will consult diabetes education coordinator for further evaluation recommendations -Diabetes education coordinator recommending considering 3 to 4 units of NovoLog 3 times daily with meal coverage with parameters of giving if she eats at least 50% of her meal and her glucose is at least 80 mg/dL  Chronic migraine -continue Tapentadol PRN  -Continue Topamax  Leukocytosis -Improved and patient's WBC went from 11.9 is now 4.0 today -Continue to monitor and trend and repeat CBC in a.m.  HLD -Holding  Atorvastatin 10 mg po qHS  Normocytic Anemia -Patient hemoglobin/hematocrit went from 13.6/43.7 and has trended down all the way to 10.4/32.9 -Checked anemia panel and showed an iron level of 56, U IBC of 260, TIBC 316, saturation ratios of 18%, ferritin level of 30, folate of 35.0 and B12 was done earlier and was normal at 490 -Monitor for signs and symptoms of bleeding; currently no overt bleeding noted -Repeat CBC in a.m.  Constipation -Likely opiate induced -Started senna docusate 1 tab p.o. twice daily, MiraLAX 17 g p.o. twice daily, -we will continue bisacodyl 5 mg p.o. daily as needed for mild constipation and may need a bisacodyl suppository however patient does not want suppository and enema right now -Continue to monitor and if not improving may try enemas  Metabolic Acidosis, improved -Patient CO2 is now 24, anion gap is 7, chloride level is now 104 -IV fluid hydration is now stopped -Repeat CMP in a.m.  Malnutrition of moderate degree/nonsevere malnutrition in the context of chronic illness -Dietitian was consulted for further evaluation recommendations and we remove the heart healthy diet restriction and she has been started on Ensure Enlive p.o. 3 times daily and she is also provided with a "high-calorie, high-protein nutrition therapy" handout   DVT prophylaxis: Enoxaparin 40 mg subcu every 24 Code Status: FULL CODE Family Communication: No family present at bedside  Disposition Plan: Pending further evaluation and clearance by neurology and neurosurgery  Status is: Inpatient  Remains inpatient appropriate because:Unsafe d/c plan, IV treatments appropriate due to intensity of illness or inability to take PO and Inpatient level of care appropriate due to severity of illness   Dispo: The patient is from: Home              Anticipated d/c is to: TBD              Anticipated d/c date is: 2 days              Patient currently is not medically stable to  d/c.  Consultants:  Neurology Neurosurgery    Procedures:  None  Antimicrobials:  Anti-infectives (From admission, onward)   None     Subjective: Seen and examined at bedside and she states that she is "hurting".  Still has not had a bowel movement yet but states that it can take up to a week for her to have a bowel movement normally.  She does not want to try any suppositories or enemas yet.  Denies any chest pain, lightheadedness or dizziness.  Ready to get her biopsy over with.  No other concerns or complaint at this time  Objective: Vitals:   02/26/20 2013 02/27/20 0024 02/27/20 0452 02/27/20 0748  BP: 134/74 (!) 149/80 140/82 (!) 146/84  Pulse: 80 79 89 91  Resp: Temp: 98.1 F (36.7 C) 98 F (36.7 C)  98.2 F (36.8 C) 97.8 F (36.6 C)  TempSrc: Oral Oral Oral Oral  SpO2: 97% 98% 96% 98%  Weight:      Height:        Intake/Output Summary (Last 24 hours) at 02/27/2020 1135 Last data filed at 02/27/2020 0537 Gross per 24 hour  Intake 1779.33 ml  Output --  Net 1779.33 ml   Filed Weights   02/23/20 0915 02/24/20 2021  Weight: 61.7 kg 60 kg   Examination: Physical Exam:  Constitutional: The patient is a thin slightly ill-appearing Caucasian female currently in no acute distress and she is eating breakfast but still complaining of some leg pain. Eyes: Lids and conjunctivae normal, sclerae anicteric  ENMT: External Ears, Nose appear normal. Grossly normal hearing.  Neck: Appears normal, supple, no cervical masses, normal ROM, no appreciable thyromegaly; no JVD Respiratory: Diminished to auscultation bilaterally, no wheezing, rales, rhonchi or crackles. Normal respiratory effort and patient is not tachypenic. No accessory muscle use.  Unlabored breathing Cardiovascular: RRR, no murmurs / rubs / gallops. S1 and S2 auscultated.  Minimal extremity edema Abdomen: Soft, non-tender, non-distended. Bowel sounds positive.  GU: Deferred. Musculoskeletal: No  clubbing / cyanosis of digits/nails. No joint deformity upper and lower extremities and would not let me palpate her lower extremities given pain. Skin: No rashes, lesions, ulcers on limited skin evaluation but she does have a right ankle tattoo. No induration; Warm and dry.  Neurologic: CN 2-12 grossly intact with no focal deficits. Romberg sign and cerebellar reflexes not assessed.  Psychiatric: Normal judgment and insight. Alert and oriented x 3. Normal mood and appropriate affect.   Data Reviewed: I have personally reviewed following labs and imaging studies  CBC: Recent Labs  Lab 02/23/20 0944 02/24/20 0855 02/25/20 0416 02/26/20 0741 02/27/20 0512  WBC 11.9* 8.5 6.8 3.5* 4.0  NEUTROABS  --  5.7 5.3 2.1 2.4  HGB 13.6 12.1 10.4* 10.8* 10.4*  HCT 43.7 38.8 34.2* 34.8* 32.9*  MCV 95.6 96.8 94.7 93.8 94.3  PLT 234 181 127* 139* 125*   Basic Metabolic Panel: Recent Labs  Lab 02/23/20 0944 02/24/20 0855 02/25/20 0416 02/26/20 0741 02/27/20 0512  NA 137 141 136 135 135  K 4.4 3.9 3.6 3.6 3.9  CL 99 107 106 105 104  CO2 21* 24 22 21* 24  GLUCOSE 325* 97 157* 170* 216*  BUN 30* 23 17 15 13   CREATININE 0.94 0.86 0.81 0.84 0.80  CALCIUM 10.1 8.8* 8.2* 8.5* 8.6*  MG  --  2.1 1.9 1.9 1.9  PHOS  --  3.9 3.6 3.3 3.5   GFR: Estimated Creatinine Clearance: 58.9 mL/min (by C-G formula based on SCr of 0.8 mg/dL). Liver Function Tests: Recent Labs  Lab 02/23/20 0944 02/24/20 0855 02/25/20 0416 02/26/20 0741 02/27/20 0512  AST 18 12* 18 15 20   ALT 22 16 17 17 17   ALKPHOS 144* 93 76 100 95  BILITOT 0.5 0.5 0.6 0.3 <0.1*  PROT 7.5 6.4* 6.2* 6.9 7.1  ALBUMIN 4.0 3.2* 2.6* 2.7* 2.6*   No results for input(s): LIPASE, AMYLASE in the last 168 hours. No results for input(s): AMMONIA in the last 168 hours. Coagulation Profile: Recent Labs  Lab 02/23/20 0944  INR 1.0   Cardiac Enzymes: No results for input(s): CKTOTAL, CKMB, CKMBINDEX, TROPONINI in the last 168 hours. BNP  (last 3 results) No results for input(s): PROBNP in the last 8760 hours. HbA1C: No results for input(s): HGBA1C in the last 72 hours. CBG: Recent Labs  Lab 02/26/20 0607 02/26/20 1126 02/26/20 1555 02/26/20 2150 02/27/20 0636  GLUCAP 184* 227* 269* 188* 196*   Lipid Profile: No results for input(s): CHOL, HDL, LDLCALC, TRIG, CHOLHDL, LDLDIRECT in the last 72 hours. Thyroid Function Tests: No results for input(s): TSH, T4TOTAL, FREET4, T3FREE, THYROIDAB in the last 72 hours. Anemia Panel: Recent Labs    02/25/20 1311 02/26/20 0741  VITAMINB12 490  --   FOLATE  --  35.0  FERRITIN  --  30  TIBC  --  316  IRON  --  56  RETICCTPCT  --  1.8   Sepsis Labs: No results for input(s): PROCALCITON, LATICACIDVEN in the last 168 hours.  Recent Results (from the past 240 hour(s))  CSF culture     Status: None   Collection Time: 02/17/20 11:39 AM   Specimen: Lumbar Puncture; Cerebrospinal Fluid  Result Value Ref Range Status   MICRO NUMBER: [id number]  Final   SPECIMEN QUALITY: Adequate  Final   Source CEREBROSPINAL FLUID (CSF)  Final   STATUS: FINAL  Final   GRAM STAIN: No organisms or white blood cells seen  Final   Result: No Growth  Final  SARS Coronavirus 2 by RT PCR (hospital order, performed in [facility] hospital lab) Nasopharyngeal Nasopharyngeal Swab     Status: None   Collection Time: 02/23/20  8:07 PM   Specimen: Nasopharyngeal Swab  Result Value Ref Range Status   SARS Coronavirus 2 NEGATIVE NEGATIVE Final    Comment: (NOTE) SARS-CoV-2 target nucleic acids are NOT DETECTED.  The SARS-CoV-2 RNA is generally detectable in upper and lower respiratory specimens during the acute phase of infection. The lowest concentration of SARS-CoV-2 viral copies this assay can detect is 250 copies / mL. A negative result does not preclude SARS-CoV-2 infection and should not be used as the sole basis for treatment or other patient management decisions.  A negative result may occur  with improper specimen collection / handling, submission of specimen other than nasopharyngeal swab, presence of viral mutation(s) within the areas targeted by this assay, and inadequate number of viral copies (<250 copies / mL). A negative result must be combined with clinical observations, patient history, and epidemiological information.  Fact Sheet for Patients:   BoilerBrush.com.cy  Fact Sheet for Healthcare Providers: https://pope.com/  This test is not yet approved or  cleared by the [place] FDA and has been authorized for detection and/or diagnosis of SARS-CoV-2 by FDA under an Emergency Use Authorization (EUA).  This EUA will remain in effect (meaning this test can be used) for the duration of the COVID-19 declaration under Section 564(b)(1) of the Act, 21 U.S.C. section 360bbb-3(b)(1), unless the authorization is terminated or revoked sooner.  Performed at [facility], [address] [place], [place] [address]     RN Pressure Injury Documentation:     Estimated body mass index is 22.71 kg/m as calculated from the following:   Height as of this encounter:  (1.626 m).   Weight as of this encounter: 60 kg.  Malnutrition Type:  Nutrition Problem: Moderate Malnutrition Etiology: chronic illness (inflammatory polyradiculoneuropathy)   Malnutrition Characteristics:  Signs/Symptoms: mild fat depletion, moderate fat depletion, mild muscle depletion, moderate muscle depletion, severe muscle depletion   Nutrition Interventions:  Interventions: Ensure Enlive (each supplement provides 350kcal and 20 grams of protein), Liberalize Diet, Education  Radiology Studies: No results found.  Scheduled Meds: . enoxaparin (LOVENOX) injection  40 mg Subcutaneous Q24H  . feeding supplement (ENSURE ENLIVE)  237 mL Oral TID BM  . insulin aspart  0-15 Units Subcutaneous TID AC & HS  . insulin aspart  3 Units  Subcutaneous TID WC  . magnesium oxide  400 mg Oral BID  . methadone  20 mg Oral TID  . nortriptyline  25 mg Oral QHS  . pneumococcal 23 valent vaccine  0.5 mL Intramuscular Tomorrow-1000  . polyethylene glycol  17 g Oral BID  . senna-docusate  1 tablet Oral BID  . topiramate  100 mg Oral Daily  . topiramate  200 mg Oral QHS   Continuous Infusions: . sodium chloride 75 mL/hr at 02/26/20 2114  . Immune Globulin 10% 25 g (02/26/20 1627)    LOS: 4 days   Merlene Laughter, DO [facility] PAGER is on AMION  If 7PM-7AM, please contact night-coverage [email or web address]

## 2020-02-28 ENCOUNTER — Inpatient Hospital Stay (HOSPITAL_COMMUNITY): Payer: Medicare Other | Admitting: Anesthesiology

## 2020-02-28 ENCOUNTER — Encounter (HOSPITAL_COMMUNITY): Payer: Self-pay | Admitting: Family Medicine

## 2020-02-28 ENCOUNTER — Encounter (HOSPITAL_COMMUNITY)
Admission: EM | Disposition: A | Discharge status: Rehabilitation Facility | Payer: Self-pay | Source: Ambulatory Visit | Attending: Internal Medicine

## 2020-02-28 DIAGNOSIS — G629 Polyneuropathy, unspecified: Secondary | ICD-10-CM

## 2020-02-28 HISTORY — PX: SURAL NERVE BX: SHX2476

## 2020-02-28 LAB — CBC WITH DIFFERENTIAL/PLATELET
Abs Immature Granulocytes: 0.02 10*3/uL (ref 0.00–0.07)
Basophils Absolute: 0 10*3/uL (ref 0.0–0.1)
Basophils Relative: 1 %
Eosinophils Absolute: 0.1 10*3/uL (ref 0.0–0.5)
Eosinophils Relative: 2 %
HCT: 33.6 % — ABNORMAL LOW (ref 36.0–46.0)
Hemoglobin: 10.7 g/dL — ABNORMAL LOW (ref 12.0–15.0)
Immature Granulocytes: 0 %
Lymphocytes Relative: 20 %
Lymphs Abs: 1.1 10*3/uL (ref 0.7–4.0)
MCH: 29.6 pg (ref 26.0–34.0)
MCHC: 31.8 g/dL (ref 30.0–36.0)
MCV: 93.1 fL (ref 80.0–100.0)
Monocytes Absolute: 0.5 10*3/uL (ref 0.1–1.0)
Monocytes Relative: 10 %
Neutro Abs: 3.7 10*3/uL (ref 1.7–7.7)
Neutrophils Relative %: 67 %
Platelets: 143 10*3/uL — ABNORMAL LOW (ref 150–400)
RBC: 3.61 MIL/uL — ABNORMAL LOW (ref 3.87–5.11)
RDW: 13.5 % (ref 11.5–15.5)
WBC: 5.5 10*3/uL (ref 4.0–10.5)
nRBC: 0 % (ref 0.0–0.2)

## 2020-02-28 LAB — GLUCOSE, CAPILLARY
Glucose-Capillary: 223 mg/dL — ABNORMAL HIGH (ref 70–99)
Glucose-Capillary: 154 mg/dL — ABNORMAL HIGH (ref 70–99)
Glucose-Capillary: 115 mg/dL — ABNORMAL HIGH (ref 70–99)
Glucose-Capillary: 122 mg/dL — ABNORMAL HIGH (ref 70–99)
Glucose-Capillary: 142 mg/dL — ABNORMAL HIGH (ref 70–99)
Glucose-Capillary: 260 mg/dL — ABNORMAL HIGH (ref 70–99)

## 2020-02-28 LAB — PHOSPHORUS: Phosphorus: 4 mg/dL (ref 2.5–4.6)

## 2020-02-28 LAB — COMPREHENSIVE METABOLIC PANEL
ALT: 20 U/L (ref 0–44)
AST: 23 U/L (ref 15–41)
Albumin: 2.8 g/dL — ABNORMAL LOW (ref 3.5–5.0)
Alkaline Phosphatase: 117 U/L (ref 38–126)
Anion gap: 7 (ref 5–15)
BUN: 16 mg/dL (ref 8–23)
CO2: 25 mmol/L (ref 22–32)
Calcium: 9.4 mg/dL (ref 8.9–10.3)
Chloride: 104 mmol/L (ref 98–111)
Creatinine, Ser: 0.86 mg/dL (ref 0.44–1.00)
GFR calc Af Amer: >60 mL/min (ref >60)
GFR calc non Af Amer: >60 mL/min (ref >60)
Glucose, Bld: 159 mg/dL — ABNORMAL HIGH (ref 70–99)
Potassium: 4.2 mmol/L (ref 3.5–5.1)
Sodium: 136 mmol/L (ref 135–145)
Total Bilirubin: 0.6 mg/dL (ref 0.3–1.2)
Total Protein: 7.9 g/dL (ref 6.5–8.1)

## 2020-02-28 LAB — VITAMIN B6: Vitamin B6: 6 ug/L (ref 2.0–32.8)

## 2020-02-28 LAB — MAGNESIUM: Magnesium: 2 mg/dL (ref 1.7–2.4)

## 2020-02-28 LAB — SURGICAL PCR SCREEN
MRSA, PCR: NEGATIVE
Staphylococcus aureus: NEGATIVE

## 2020-02-28 SURGERY — SURAL NERVE BIOPSY
Anesthesia: General | Site: Leg Upper | Laterality: Right

## 2020-02-28 MED ORDER — ONDANSETRON HCL 4 MG/2ML IJ SOLN
INTRAMUSCULAR | Status: AC
  Filled 2020-02-28: qty 2

## 2020-02-28 MED ORDER — ORAL CARE MOUTH RINSE: 15.0000 mL | Freq: Once | OROMUCOSAL | Status: AC

## 2020-02-28 MED ORDER — IMMUNE GLOBULIN (HUMAN) 10 GM/100ML IV SOLN: 2.0000 g/kg | INTRAVENOUS | Status: DC

## 2020-02-28 MED ORDER — 0.9 % SODIUM CHLORIDE (POUR BTL) OPTIME
TOPICAL | Status: DC | PRN
  Administered 2020-02-28: 1000 mL

## 2020-02-28 MED ORDER — CEFAZOLIN SODIUM-DEXTROSE 1-4 GM/50ML-% IV SOLN
1.0000 g | Freq: Three times a day (TID) | INTRAVENOUS | Status: AC
  Administered 2020-02-28 – 2020-02-29 (×2): 1 g via INTRAVENOUS
  Filled 2020-02-28 (×3): qty 50

## 2020-02-28 MED ORDER — EPHEDRINE SULFATE-NACL 50-0.9 MG/10ML-% IV SOSY
PREFILLED_SYRINGE | INTRAVENOUS | Status: DC | PRN
  Administered 2020-02-28: 15 mg via INTRAVENOUS
  Administered 2020-02-28 (×2): 10 mg via INTRAVENOUS

## 2020-02-28 MED ORDER — SUGAMMADEX SODIUM 200 MG/2ML IV SOLN
INTRAVENOUS | Status: DC | PRN
  Administered 2020-02-28 (×2): 100 mg via INTRAVENOUS

## 2020-02-28 MED ORDER — ROCURONIUM BROMIDE 10 MG/ML (PF) SYRINGE
PREFILLED_SYRINGE | INTRAVENOUS | Status: DC | PRN
  Administered 2020-02-28: 30 mg via INTRAVENOUS

## 2020-02-28 MED ORDER — CHLORHEXIDINE GLUCONATE CLOTH 2 % EX PADS: 6.0000 | MEDICATED_PAD | Freq: Once | CUTANEOUS | Status: DC

## 2020-02-28 MED ORDER — IMMUNE GLOBULIN (HUMAN) 10 GM/100ML IV SOLN
400.0000 mg/kg | Freq: Once | INTRAVENOUS | Status: AC
  Administered 2020-02-28: 25 g via INTRAVENOUS
  Filled 2020-02-28 (×2): qty 300

## 2020-02-28 MED ORDER — SODIUM CHLORIDE 0.9 % IV SOLN
250.0000 mL | INTRAVENOUS | Status: DC
  Administered 2020-02-28: 250 mL via INTRAVENOUS

## 2020-02-28 MED ORDER — CHLORHEXIDINE GLUCONATE CLOTH 2 % EX PADS
6.0000 | MEDICATED_PAD | Freq: Once | CUTANEOUS | Status: AC
  Administered 2020-02-28: 6 via TOPICAL

## 2020-02-28 MED ORDER — FENTANYL CITRATE (PF) 100 MCG/2ML IJ SOLN: INTRAMUSCULAR | Status: DC | PRN

## 2020-02-28 MED ORDER — PHENYLEPHRINE HCL (PRESSORS) 10 MG/ML IV SOLN
INTRAVENOUS | Status: DC | PRN
  Administered 2020-02-28: 80 ug via INTRAVENOUS
  Administered 2020-02-28 (×2): 120 ug via INTRAVENOUS
  Administered 2020-02-28: 80 ug via INTRAVENOUS
  Administered 2020-02-28 (×2): 120 ug via INTRAVENOUS
  Administered 2020-02-28: 80 ug via INTRAVENOUS

## 2020-02-28 MED ORDER — ONDANSETRON HCL 4 MG/2ML IJ SOLN
INTRAMUSCULAR | Status: DC | PRN
  Administered 2020-02-28: 4 mg via INTRAVENOUS

## 2020-02-28 MED ORDER — LIDOCAINE 2% (20 MG/ML) 5 ML SYRINGE
INTRAMUSCULAR | Status: DC | PRN
  Administered 2020-02-28: 60 mg via INTRAVENOUS

## 2020-02-28 MED ORDER — LACTATED RINGERS IV SOLN: INTRAVENOUS | Status: DC

## 2020-02-28 MED ORDER — SODIUM CHLORIDE 0.9% FLUSH
3.0000 mL | Freq: Two times a day (BID) | INTRAVENOUS | Status: DC
  Administered 2020-02-29 – 2020-03-03 (×6): 3 mL via INTRAVENOUS

## 2020-02-28 MED ORDER — DEXAMETHASONE SODIUM PHOSPHATE 10 MG/ML IJ SOLN
INTRAMUSCULAR | Status: AC
  Filled 2020-02-28: qty 1

## 2020-02-28 MED ORDER — ONDANSETRON HCL 4 MG PO TABS: 4.0000 mg | ORAL_TABLET | Freq: Four times a day (QID) | ORAL | Status: DC | PRN

## 2020-02-28 MED ORDER — FENTANYL CITRATE (PF) 250 MCG/5ML IJ SOLN
INTRAMUSCULAR | Status: AC
  Filled 2020-02-28: qty 5

## 2020-02-28 MED ORDER — PROPOFOL 10 MG/ML IV BOLUS
INTRAVENOUS | Status: DC | PRN
  Administered 2020-02-28: 100 mg via INTRAVENOUS

## 2020-02-28 MED ORDER — MIDAZOLAM HCL 2 MG/2ML IJ SOLN
INTRAMUSCULAR | Status: AC
  Filled 2020-02-28: qty 2

## 2020-02-28 MED ORDER — MENTHOL 3 MG MT LOZG: 1.0000 | LOZENGE | OROMUCOSAL | Status: DC | PRN

## 2020-02-28 MED ORDER — ACETAMINOPHEN 325 MG PO TABS: 650.0000 mg | ORAL_TABLET | ORAL | Status: DC | PRN

## 2020-02-28 MED ORDER — BUPIVACAINE HCL (PF) 0.25 % IJ SOLN
INTRAMUSCULAR | Status: AC
  Filled 2020-02-28: qty 30

## 2020-02-28 MED ORDER — BUPIVACAINE HCL (PF) 0.25 % IJ SOLN
INTRAMUSCULAR | Status: DC | PRN
  Administered 2020-02-28: 10 mL

## 2020-02-28 MED ORDER — PROPOFOL 10 MG/ML IV BOLUS
INTRAVENOUS | Status: AC
  Filled 2020-02-28: qty 20

## 2020-02-28 MED ORDER — SODIUM CHLORIDE 0.9% FLUSH: 3.0000 mL | INTRAVENOUS | Status: DC | PRN

## 2020-02-28 MED ORDER — SENNA 8.6 MG PO TABS
1.0000 | ORAL_TABLET | Freq: Two times a day (BID) | ORAL | Status: DC
  Administered 2020-02-28 – 2020-03-01 (×3): 8.6 mg via ORAL
  Filled 2020-02-28 (×6): qty 1

## 2020-02-28 MED ORDER — POTASSIUM CHLORIDE IN NACL 20-0.9 MEQ/L-% IV SOLN
INTRAVENOUS | Status: DC
  Filled 2020-02-28: qty 1000

## 2020-02-28 MED ORDER — CHLORHEXIDINE GLUCONATE 0.12 % MT SOLN: 15.0000 mL | Freq: Once | OROMUCOSAL | Status: AC

## 2020-02-28 MED ORDER — IMMUNE GLOBULIN (HUMAN) 10 GM/100ML IV SOLN: 400.0000 mg/kg | INTRAVENOUS | Status: DC

## 2020-02-28 MED ORDER — PHENYLEPHRINE 40 MCG/ML (10ML) SYRINGE FOR IV PUSH (FOR BLOOD PRESSURE SUPPORT)
PREFILLED_SYRINGE | INTRAVENOUS | Status: AC
  Filled 2020-02-28: qty 10

## 2020-02-28 MED ORDER — MIDAZOLAM HCL 5 MG/5ML IJ SOLN
INTRAMUSCULAR | Status: DC | PRN
  Administered 2020-02-28: 2 mg via INTRAVENOUS

## 2020-02-28 MED ORDER — DEXAMETHASONE SODIUM PHOSPHATE 10 MG/ML IJ SOLN
INTRAMUSCULAR | Status: DC | PRN
  Administered 2020-02-28: 4 mg via INTRAVENOUS

## 2020-02-28 MED ORDER — ONDANSETRON HCL 4 MG/2ML IJ SOLN
4.0000 mg | Freq: Four times a day (QID) | INTRAMUSCULAR | Status: DC | PRN
  Administered 2020-02-29: 4 mg via INTRAVENOUS
  Filled 2020-02-28: qty 2

## 2020-02-28 MED ORDER — SODIUM CHLORIDE 0.9 % IV SOLN
INTRAVENOUS | Status: DC | PRN
  Administered 2020-02-28: 500 mL

## 2020-02-28 MED ORDER — PHENOL 1.4 % MT LIQD: 1.0000 | OROMUCOSAL | Status: DC | PRN

## 2020-02-28 MED ORDER — DIPHENHYDRAMINE-ZINC ACETATE 2-0.1 % EX CREA
TOPICAL_CREAM | Freq: Three times a day (TID) | CUTANEOUS | Status: DC | PRN
  Administered 2020-02-28: 1 via TOPICAL
  Filled 2020-02-28: qty 28

## 2020-02-28 MED ORDER — CEFAZOLIN SODIUM-DEXTROSE 2-4 GM/100ML-% IV SOLN
2.0000 g | INTRAVENOUS | Status: AC
  Administered 2020-02-28: 2 g via INTRAVENOUS
  Filled 2020-02-28: qty 100

## 2020-02-28 MED ORDER — CHLORHEXIDINE GLUCONATE 0.12 % MT SOLN
OROMUCOSAL | Status: AC
  Administered 2020-02-28: 15 mL via OROMUCOSAL
  Filled 2020-02-28: qty 15

## 2020-02-28 MED ORDER — ACETAMINOPHEN 650 MG RE SUPP: 650.0000 mg | RECTAL | Status: DC | PRN

## 2020-02-28 SURGICAL SUPPLY — 41 items
BAG DECANTER FOR FLEXI CONT (MISCELLANEOUS) ×2 IMPLANT
BENZOIN TINCTURE PRP APPL 2/3 (GAUZE/BANDAGES/DRESSINGS) ×2 IMPLANT
BNDG ELASTIC 4X5.8 VLCR STR LF (GAUZE/BANDAGES/DRESSINGS) ×2 IMPLANT
BNDG GAUZE ELAST 4 BULKY (GAUZE/BANDAGES/DRESSINGS) ×2 IMPLANT
CANISTER SUCT 3000ML PPV (MISCELLANEOUS) ×2 IMPLANT
CARTRIDGE OIL MAESTRO DRILL (MISCELLANEOUS) IMPLANT
COVER WAND RF STERILE (DRAPES) ×2 IMPLANT
DERMABOND ADVANCED (GAUZE/BANDAGES/DRESSINGS) ×1
DERMABOND ADVANCED .7 DNX12 (GAUZE/BANDAGES/DRESSINGS) ×1 IMPLANT
DIFFUSER DRILL AIR PNEUMATIC (MISCELLANEOUS) IMPLANT
DRAPE HALF SHEET 40X57 (DRAPES) ×2 IMPLANT
DRAPE LAPAROTOMY 100X72X124 (DRAPES) IMPLANT
DURAPREP 26ML APPLICATOR (WOUND CARE) ×2 IMPLANT
ELECT REM PT RETURN 9FT ADLT (ELECTROSURGICAL) ×2
ELECTRODE REM PT RTRN 9FT ADLT (ELECTROSURGICAL) ×1 IMPLANT
GAUZE SPONGE 4X4 12PLY STRL (GAUZE/BANDAGES/DRESSINGS) ×2 IMPLANT
GLOVE BIO SURGEON STRL SZ7 (GLOVE) ×2 IMPLANT
GLOVE BIO SURGEON STRL SZ8 (GLOVE) ×2 IMPLANT
GLOVE BIOGEL PI IND STRL 7.0 (GLOVE) ×2 IMPLANT
GLOVE BIOGEL PI INDICATOR 7.0 (GLOVE) ×2
GOWN STRL REUS W/ TWL LRG LVL3 (GOWN DISPOSABLE) ×1 IMPLANT
GOWN STRL REUS W/ TWL XL LVL3 (GOWN DISPOSABLE) ×1 IMPLANT
GOWN STRL REUS W/TWL 2XL LVL3 (GOWN DISPOSABLE) ×2 IMPLANT
GOWN STRL REUS W/TWL LRG LVL3 (GOWN DISPOSABLE) ×2
GOWN STRL REUS W/TWL XL LVL3 (GOWN DISPOSABLE) ×2
KIT BASIN OR (CUSTOM PROCEDURE TRAY) ×2 IMPLANT
KIT TURNOVER KIT B (KITS) ×2 IMPLANT
LOOP VESSEL MAXI BLUE (MISCELLANEOUS) ×2 IMPLANT
NEEDLE HYPO 25X1 1.5 SAFETY (NEEDLE) ×2 IMPLANT
NS IRRIG 1000ML POUR BTL (IV SOLUTION) ×2 IMPLANT
OIL CARTRIDGE MAESTRO DRILL (MISCELLANEOUS)
PACK LAMINECTOMY NEURO (CUSTOM PROCEDURE TRAY) ×2 IMPLANT
PAD ARMBOARD 7.5X6 YLW CONV (MISCELLANEOUS) ×2 IMPLANT
STOCKINETTE 4X48 STRL (DRAPES) ×2 IMPLANT
STRIP CLOSURE SKIN 1/2X4 (GAUZE/BANDAGES/DRESSINGS) ×2 IMPLANT
SUT VIC AB 2-0 CP2 18 (SUTURE) ×2 IMPLANT
SUT VIC AB 3-0 SH 8-18 (SUTURE) ×2 IMPLANT
TOWEL GREEN STERILE (TOWEL DISPOSABLE) ×2 IMPLANT
TOWEL GREEN STERILE FF (TOWEL DISPOSABLE) ×2 IMPLANT
UNDERPAD 30X36 HEAVY ABSORB (UNDERPADS AND DIAPERS) ×2 IMPLANT
WATER STERILE IRR 1000ML POUR (IV SOLUTION) ×2 IMPLANT

## 2020-02-28 NOTE — Anesthesia Postprocedure Evaluation (Signed)
Anesthesia Post Note  Patient: Monica [name]  Procedure(s) Performed: SURAL NERVE / Quadricep BIOPSY (Right Leg Upper)     Patient location during evaluation: PACU Anesthesia Type: General Level of consciousness: awake and alert Pain management: pain level controlled Vital Signs Assessment: post-procedure vital signs reviewed and stable Respiratory status: spontaneous breathing, nonlabored ventilation and respiratory function stable Cardiovascular status: blood pressure returned to baseline and stable Postop Assessment: no apparent nausea or vomiting Anesthetic complications: no   No complications documented.  Last Vitals:  Vitals:   02/28/20 1723 02/28/20 1738  BP: (!) 143/72 136/72  Pulse: 91 91  Resp: 11 13  Temp: 36.6 C   SpO2: 96% 97%    Last Pain:  Vitals:   02/28/20 1723  TempSrc:   PainSc: 0-No pain                 Dashanae Longfield,W. EDMOND

## 2020-02-28 NOTE — Progress Notes (Signed)
Patient is ready for surgery. Change in leg pain despite IVIG. I have answered all of her questions to the best of my ability. She understands the risk of the surgery include but not limited to bleeding, infection, numbness, weakness, lack relief of times, worsening symptoms, lack of diagnosis, and anesthesia risk. She agrees to proceed

## 2020-02-28 NOTE — Progress Notes (Signed)
Inpatient Diabetes Program Recommendations  AACE/ADA: New Consensus Statement on Inpatient Glycemic Control (2015)  Target Ranges:  Prepandial:   less than 140 mg/dL      Peak postprandial:   less than 180 mg/dL (1-2 hours)      Critically ill patients:  140 - 180 mg/dL   Lab Results  Component Value Date   GLUCAP 154 (H) 02/28/2020   HGBA1C 8.6 (H) 02/23/2020    Review of Glycemic Control Results for Monica [name], Monica [name] (MRN [id number]) as of 02/28/2020 12:28  Ref. Range 02/27/2020 16:04 02/27/2020 21:22 02/28/2020 06:21 02/28/2020 12:09  Glucose-Capillary Latest Ref Range: 70 - 99 mg/dL 967 (H) 591 (H) 638 (H) 154 (H)   Diabetes history: Type 2 DM Outpatient Diabetes medications: Metformin 1000 mg BID Current orders for Inpatient glycemic control: Novolog 0-15 units TID, Novolog 3 units TID, Levemir 5 units QD  Inpatient Diabetes Program Recommendations:    Consider increasing Levemir 5 units BID.   Thanks, Lujean Rave, MSN, RNC-OB Diabetes Coordinator [phone] (8a-5p)

## 2020-02-28 NOTE — Progress Notes (Signed)
Pt states that she has developed a rash near her IV dressing site "that started during day shift". Now it has progressed to another spot proximal and is now itchy. Shalhoub informed and something for her itch requested.

## 2020-02-28 NOTE — Progress Notes (Signed)
Transported to OR.  Report called to [facility].  Has remained NPO except meds.  Husband at her side and said he will wait in the room until she returns.

## 2020-02-28 NOTE — Plan of Care (Signed)

## 2020-02-28 NOTE — TOC Initial Note (Signed)
Transition of Care [facility]) - Initial/Assessment Note    Patient Details  Name: Monica [name] MRN: [id number] Date of Birth: [birth year]  Transition of [facility] CM/SW Contact:    Geralynn Ochs, LCSW Phone Number: 02/28/2020, 1:05 PM  Clinical Narrative:         CSW met with patient and spouse at bedside to discuss therapy recommendations. Patient and spouse would like to return home, agreeable to home health. Had used [facility] before, would like to continue. Patient already has 3N1 at home. CSW to follow.          Expected Discharge Plan: [place] Barriers to Discharge: Continued Medical Work up   Patient Goals and CMS Choice Patient states their goals for this hospitalization and ongoing recovery are:: to get back home, be in less pain CMS Medicare.gov Compare Post Acute Care list provided to:: Patient Choice offered to / list presented to : Patient, Spouse  Expected Discharge Plan and Services Expected Discharge Plan: [place] Acute Care Choice: [place] arrangements for the past 2 months: Single Family Home                                      Prior Living Arrangements/Services Living arrangements for the past 2 months: Single Family Home Lives with:: Self, Spouse              Current home services: DME    Activities of Daily Living Home Assistive Devices/Equipment: Environmental consultant (specify type), Wheelchair ADL Screening (condition at time of admission) Patient's cognitive ability adequate to safely complete daily activities?: Yes Is the patient deaf or have difficulty hearing?: No Does the patient have difficulty seeing, even when wearing glasses/contacts?: No Does the patient have difficulty concentrating, remembering, or making decisions?: No Patient able to express need for assistance with ADLs?: Yes Does the patient have difficulty dressing or bathing?: Yes Independently performs ADLs?:  No Communication: Independent Is this a change from baseline?: Pre-admission baseline Dressing (OT): Needs assistance Is this a change from baseline?: Pre-admission baseline Grooming: Needs assistance Is this a change from baseline?: Pre-admission baseline Feeding: Independent Bathing: Needs assistance Is this a change from baseline?: Pre-admission baseline Toileting: Needs assistance Is this a change from baseline?: Pre-admission baseline In/Out Bed: Needs assistance Is this a change from baseline?: Pre-admission baseline Walks in Home: Needs assistance Is this a change from baseline?: Pre-admission baseline Does the patient have difficulty walking or climbing stairs?: Yes Weakness of Legs: Both Weakness of Arms/Hands: None  Permission Sought/Granted                  Emotional Assessment              Admission diagnosis:  Leg weakness, bilateral [R29.898] 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 Malnutrition of moderate degree 02/26/2020  . 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 Migraine 02/23/2020  . Renal failure 12/30/2019  . Lower extremity weakness 10/19/2019  . Arthritis of right sacroiliac joint 08/18/2019  . Altered bowel habits 08/03/2019  . Trochanteric bursitis, left hip 07/20/2019  . Uncontrolled type 2 diabetes mellitus with hyperglycemia, without long-term current use of insulin ([place]) 06/17/2019  . PTSD (post-traumatic stress disorder) 10/15/2016  . Occipital neuralgia (Location of Primary Source of Pain) (Bilateral) (L>R) 10/02/2015  . Chronic cervical  radicular pain (Left) 08/31/2015  . 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 Cervical facet arthropathy (severe at C3-4) (Bilateral) (L>R) 07/26/2015  . Cervical foraminal stenosis (C3-4) (Left) 07/26/2015  . Cervical spondylosis 06/13/2015  . Cervical facet syndrome (Location of Secondary source of pain)  (Bilateral) (L>R) 06/13/2015  . Chronic pain 06/13/2015  . Cervicogenic headache (Location of Primary Source of Pain) (Bilateral) (L>R) 06/13/2015  . Chronic neck pain (Location of Secondary source of pain) (Bilateral) (L>R) 06/13/2015  . Long term current use of opiate analgesic 06/13/2015  . Opiate use (600 MME/Day) 06/13/2015  . Opiate dependence ([place]) 06/13/2015  . Myofascial muscle pain 04/19/2013  . Dyspnea 02/10/2013  . Intractable chronic migraine without aura and with status migrainosus 04/22/2012  . Depression 04/22/2012  . Borderline personality disorder ([place]) 04/22/2012  . GERD with esophagitis 05/31/2011   PCP:  Loman Brooklyn, FNP Pharmacy:   [place], [place] [address] [place] [address] Phone: [phone] Fax: [phone]     Social Determinants of Health (SDOH) Interventions    Readmission Risk Interventions No flowsheet data found.

## 2020-02-28 NOTE — Anesthesia Preprocedure Evaluation (Addendum)
Anesthesia Evaluation  Patient identified by MRN, date of birth, ID band Patient awake    Reviewed: Allergy & Precautions, H&P , NPO status , Patient's Chart, lab work & pertinent test results  Airway Mallampati: II  TM Distance: >3 FB Neck ROM: Full    Dental no notable dental hx. (+) Teeth Intact, Dental Advisory Given   Pulmonary neg pulmonary ROS,    Pulmonary exam normal breath sounds clear to auscultation       Cardiovascular negative cardio ROS   Rhythm:Regular Rate:Normal     Neuro/Psych  Headaches, Seizures -, Well Controlled,  Anxiety Depression    GI/Hepatic Neg liver ROS, GERD  ,  Endo/Other  diabetes, Type 2, Oral Hypoglycemic Agents  Renal/GU Renal disease  negative genitourinary   Musculoskeletal  (+) Arthritis , Osteoarthritis,    Abdominal   Peds  Hematology negative hematology ROS (+)   Anesthesia Other Findings   Reproductive/Obstetrics negative OB ROS                            Anesthesia Physical Anesthesia Plan  ASA: III  Anesthesia Plan: General   Post-op Pain Management:    Induction: Intravenous  PONV Risk Score and Plan: 4 or greater and Ondansetron, Dexamethasone and Midazolam  Airway Management Planned: Oral ETT  Additional Equipment:   Intra-op Plan:   Post-operative Plan: Extubation in OR  Informed Consent: I have reviewed the patients History and Physical, chart, labs and discussed the procedure including the risks, benefits and alternatives for the proposed anesthesia with the patient or authorized representative who has indicated his/her understanding and acceptance.     Dental advisory given  Plan Discussed with: CRNA  Anesthesia Plan Comments:         Anesthesia Quick Evaluation

## 2020-02-28 NOTE — Op Note (Signed)
02/28/2020  5:11 PM  PATIENT:  Monica [name]  69 y.o. female  PRE-OPERATIVE DIAGNOSIS: Leg pain with muscle weakness  POST-OPERATIVE DIAGNOSIS:  same  PROCEDURE:  1.  Right sural nerve biopsy, 2.  Right superficial quadriceps muscle biopsy  SURGEON:  Marikay Alar, MD  ASSISTANTS: Leo Grosser, FNP  ANESTHESIA:   General  EBL: Minimal ml  Total I/O In: 1000 [I.V.:1000] Out: -   BLOOD ADMINISTERED: none  DRAINS: None  SPECIMEN:  none  INDICATION FOR PROCEDURE: This patient presented with leg pain with numbness and burning. The patient tried conservative measures without relief. Pain was debilitating.  It was felt that she may have a inflammatory polyneuropathy.  Neurology recommended muscle biopsy and nerve biopsy. Patient understood the risks, benefits, and alternatives and potential outcomes and wished to proceed.  PROCEDURE DETAILS: The patient was taken the operating room and after induction of adequate generalized ventricular seizure she was placed in supine position on the operating table.  Her right leg was prepped circumferentially from the groin to the toes and then draped in usual sterile fashion.  We planned 2 incisions.  One linear incision over the right lateral quadriceps muscle.  One incision behind the right lateral malleolus.  We injected local anesthetic and made our first incision behind the lateral malleolus.  We placed self-retaining retractor I dissected through the soft tissues to identify the right sural nerve is a wraparound behind the lateral malleolus.  Dissected out a length of the nerve and then tied silk sutures both proximally and distally and then cut the nerve between the sutures.  This was sent to pathology.  We then closed the subcutaneous tissues with 2-0 Vicryl in the subcuticular tissue with 3-0 Vicryl and the skin with Dermabond.  Then made a linear incision over the right lateral quadriceps and dissected down to the fascia.  The fascia was opened  with Bovie cautery and then we lifted the muscle and cut away a length of muscle that was sent to pathology.  We then dried the surgical bed with bipolar cautery.  We closed the fascia with interrupted 2-0 Vicryl.  The close the subcutaneous tissue with 2-0 Vicryl the subcuticular tissue with 3-0 Vicryl.  We closed the skin with Dermabond.  The drapes were removed and sterile dressings were applied.  The patient was awakened and transported to the recovery room in stable condition.  At end of the procedure all sponge needle and instrument counts were correct.   PLAN OF CARE: Admit to inpatient   PATIENT DISPOSITION:  PACU - hemodynamically stable.   Delay start of Pharmacological VTE agent (>24hrs) due to surgical blood loss or risk of bleeding:  yes

## 2020-02-28 NOTE — Transfer of Care (Signed)
Immediate Anesthesia Transfer of Care Note  Patient: Monica [name]  Procedure(s) Performed: SURAL NERVE / Quadricep BIOPSY (Right Leg Upper)  Patient Location: PACU  Anesthesia Type:General  Level of Consciousness: awake, alert  and oriented  Airway & Oxygen Therapy: Patient Spontanous Breathing and Patient connected to nasal cannula oxygen  Post-op Assessment: Report given to RN and Post -op Vital signs reviewed and stable  Post vital signs: Reviewed and stable  Last Vitals:  Vitals Value Taken Time  BP 143/72 02/28/20 1723  Temp 36.6 C 02/28/20 1723  Pulse 92 02/28/20 1729  Resp 12 02/28/20 1729  SpO2 97 % 02/28/20 1729  Vitals shown include unvalidated device data.  Last Pain:  Vitals:   02/28/20 1225  TempSrc: Oral  PainSc:       Patients Stated Pain Goal: 2 (02/28/20 3704)  Complications: No complications documented.

## 2020-02-28 NOTE — Anesthesia Procedure Notes (Signed)
Procedure Name: Intubation Date/Time: 02/28/2020 4:06 PM Performed by: Inda Coke, CRNA Pre-anesthesia Checklist: Patient identified, Emergency Drugs available, Suction available and Patient being monitored Patient Re-evaluated:Patient Re-evaluated prior to induction Oxygen Delivery Method: Circle System Utilized Preoxygenation: Pre-oxygenation with 100% oxygen Induction Type: IV induction Ventilation: Mask ventilation without difficulty Laryngoscope Size: Mac and 3 Grade View: Grade I Tube type: Oral Tube size: 7.0 mm Number of attempts: 1 Airway Equipment and Method: Stylet and Oral airway Placement Confirmation: ETT inserted through vocal cords under direct vision,  positive ETCO2 and breath sounds checked- equal and bilateral Secured at: 21 cm Tube secured with: Tape Dental Injury: Teeth and Oropharynx as per pre-operative assessment

## 2020-02-28 NOTE — Progress Notes (Signed)
PROGRESS NOTE    Monica [name]  AOZ:[id number] DOB: 07/27/1951 DOA: 02/23/2020 PCP: Monica Fudge, FNP   Brief Narrative: HPI per Monica [name] on 02/23/20 Monica [name] is a 69 y.o. female with medical history significant for chronic migraine, type 2 diabetes, chronic pain on methadone, PTSD who presents with concerns of worsening lower leg pain.  Patient has been having bilateral lower leg pain for the past 8 months and was referred to neurology by her PMR physician. She has had extensive work-up including an MRI of the lumbar spine, EMG and an LP. EMG noted to have right L3 radiculopathy and right L5 radiculopathy.  LP noted to have few cells but significantly elevated CSF protein consistent with inflammatory polyradiculoneuropathy and IVIG was recommended. She was recently given a week of steroids at the beginning of July.  For the past 2 to 3 weeks her pain has acutely worsened and she has not been able to walk and now has been wheelchair-bound. She describes the pain as being sharp, electric-like with numbness and tingling of her bilateral feet.  It is worse when she sits down.   Her left leg is usually better than her right but recently it also started to have worsening symptoms.  She also feels as if her waistline is actually up at her lower ribs.  Also has been having decreased bowel movement since symptoms started 8 months ago and has only been able to have 1 bowel movement per month.  Her last bowel movement was 2 days ago after taking Dulcolax.  Denies any issues with urine output.  Denies any abdominal pain.  No nausea or vomiting.  She was sent to ED by outpatient neurology for IVIG due to progressively worsening symptoms.   ED Course: She was afebrile, normotensive and mildly tachycardic up to 130.  She has mild leukocytosis of 11.9 although recently was on prednisone.  Glucose of 325 with anion gap of 17, CO2 of 21.  **Interim History  Neurology evaluating and recommending  several laboratory studies and recommended L-spine MRI with contrast however she does not want to have this done.  Neurology is now starting the patient on IVIG daily empirically of 2 g/kg divided over 5 doses  02/25/20: Neurology recommending a possible nerve muscle biopsy and recommending continuing 5 days of IVIG and Monica [name] recommends to contact Neurosurgery to schedule Bx. We will titrate up her morphine however will need to be judicious given that she is on chronic pain patient and already on methadone.  PT and OT unable to work with the patient again today given her pain.  Neurosurgery was consulted and they are planning for sural nerve biopsy on Monday and they have made the patient n.p.o. after midnight Sunday in preparation.  02/26/2020: Continues to complain of some morning pain and states that whenever she gets throughout the day she improves.  Has not had a bowel movement in several days now.  Understand that she will be going for biopsy on Monday for the right sural nerve  02/27/20: Continues to complain of some pain and ready to get her biopsy over with.  Still not had a bowel movement but states that this happens to her frequently and that she can go a week without having a bowel movement usually.  Labs are relatively stable and her acidosis is improved.  02/28/20: Patient still complaining of some right leg pain.  She is going for her biopsy later this afternoon.  She will complete  her fifth dose of IVIG today.  Also states that she started not had a bowel movement yet but does not want to try a suppository just yet.  Assessment & Plan:   Principal Problem:   Inflammatory polyneuropathy (HCC) Active Problems:   Chronic pain   Uncontrolled type 2 diabetes mellitus with hyperglycemia, without long-term current use of insulin (HCC)   Migraine   Malnutrition of moderate degree  Inflammatory polyradiculoneuropathy -Differential diagnosis include idiopathic lumbosacral radicular  plexus neuropathy/diabetic amyotrophy per neurology; feel it is less likely AIDP more likely to be idiopathic lumbosacral radicular plexus neuropathy but then they also feel that other differentials or to consider nondiabetes related sensory or motor neuropathies -Recent lumbar CT spine showed "Mild curvature convex to the left. 2 mm chronic anterolisthesis L4-5. Previous posterior decompression and discectomy at L4-5. No compressive stenosis. Bilateral facet osteoarthritis. L5-S1 facet osteoarthritis with 2 mm of anterolisthesis. No compressive stenosis. Mild non-compressive disc bulges at L2-3 and L3-4. Question if the nerve roots of the cauda equina are minimally larger than normal, some measuring up to 1-1.2 mm in diameter, raising the possibility of some neuropathic process. I do not think this potential finding is definitely valid." -MRI Pelvis on 02/18/20 showed "No acute findings or explanation for the patient's symptoms.  Mild degenerative and postsurgical changes within the lumbar spine associated with a convex left scoliosis. No evidence of sacral nerve root encroachment. Mild muscular asymmetries as described." -Neurology recommended getting an MRI of the L-spine with contrast however she does not want to have a repeat MRI and only where she will get it done is under general anesthesia labs pending per neuro  -Urinalysis not done despite being ordered twice -B12 and folate level were normal -Thiamine levels pending -B6 levels, pending -Angiotensin-converting enzyme 53 -Copper, was 106 -SSA SSB antibodie was less than 0.2 -Antinuclear antibodies were positive IFA and she had a homogeneous pattern 1:160 -ANCA negative -Copper levels in the serum still pending -Heavy metal screen was negative and had 5 of arsenic, less than 1 of lead, and less than 1.0 mercury -Hemoglobin A1c was 8.6 -Neuro to decide on IVIG after lab results and second opinion from outpatient neurologist and they have  decided to initiate IVIG; and today is day 5 out of 5 of her IVIG -Obtain PT/OT and they initially recommended home health; they are now recommending SNF versus home health PT and OT -will give PRN morphine for any severe pain not controlled with her chronic pain regimen -Continue supportive care with normal saline at 75 MLS per hour but will stop now -Continue with bowel regimen bisacodyl 5 mg p.o. daily as needed for mild constipation -Neurology now recommending obtaining a neurosurgery consultation for nerve muscle biopsy given Dr. Bess HarvestArora's discussion with Dr. Daisy BlossomAhearn; Dr. Wilford CornerArora recommends obtaining a Right Quadirceps and Right Sural Nerve Bx -Neurosurgery consulted and they will do the right sural nerve biopsy today in the afternoon  Chronic pain -Continue methadone 20 mg p.o. 3 times daily, nortriptyline 25 mg p.o. nightly, prn tizanidine dose p.o. every 6 hours as needed for muscle spasm -Continue judicious use of her pain medications and she follows with Dr. Whitney PostSchwartz [place] for pain -She is currently on IV morphine for breakthrough and getting 1 mg every 3 hours as needed we will titrate up to 1 mg every 2 hours as needed but will not go above this given her chronic pain history  Uncontrolled Type 2 diabetes with hyperglycemia -Glucose of 325 with anion gap  of 17, CO2 of 21 on admission: Metabolic acidosis now improved with a CO2 of 24, anion gap of 10, chloride level of 107 -She was given Will give one time dose of 10 units of Novolog and place on moderate SSI -Added 5 units of Levemir subcu daily -Hemoglobin A1c is 8.6 -Continuous IV NS 75cc fluids will now be stopped -CBGs ranging from 196-292 -Holding home Metformin 1000 mg po BID and Dapagliflozin 1 tab po daily  -we will consult diabetes education coordinator for further evaluation recommendations -Diabetes education coordinator recommending considering 3 to 4 units of NovoLog 3 times daily with meal coverage with parameters of giving  if she eats at least 50% of her meal and her glucose is at least 80 mg/dL  Chronic migraine -continue Tapentadol PRN  -Continue Topamax  Leukocytosis -Improved and patient's WBC went from 11.9 is now 5.5 today -Continue to monitor and trend and repeat CBC in a.m.  HLD -Holding Atorvastatin 10 mg po qHS  Normocytic Anemia -Patient hemoglobin/hematocrit went from 13.6/43.7 and has trended down all the way to 10.4/32.9 is now slightly up and is now 10.7/33.6 -Checked anemia panel and showed an iron level of 56, U IBC of 260, TIBC 316, saturation ratios of 18%, ferritin level of 30, folate of 35.0 and B12 was done earlier and was normal at 490 -Monitor for signs and symptoms of bleeding; currently no overt bleeding noted -Repeat CBC in a.m.  Constipation -Likely opiate induced -Started senna docusate 1 tab p.o. twice daily, MiraLAX 17 g p.o. twice daily, -we will continue bisacodyl 5 mg p.o. daily as needed for mild constipation and may need a bisacodyl suppository however patient does not want suppository and enema right now -Continue to monitor and if not improving may try enemas  Metabolic Acidosis, improved -Patient CO2 is now 25, anion gap is 7, chloride level is now 104 -IV fluid hydration is now stopped -Repeat CMP in a.m.  Malnutrition of moderate degree/nonsevere malnutrition in the context of chronic illness -Dietitian was consulted for further evaluation recommendations and we remove the heart healthy diet restriction and she has been started on Ensure Enlive p.o. 3 times daily and she is also provided with a "high-calorie, high-protein nutrition therapy" handout   DVT prophylaxis: Enoxaparin 40 mg subcu every 24 Code Status: FULL CODE Family Communication: No family present at bedside  Disposition Plan: Pending further evaluation and clearance by neurology and neurosurgery; she is getting a biopsy today and she will be completing her IVIG today.  PT OT recommending SNF  versus home health OT now.  Status is: Inpatient  Remains inpatient appropriate because:Unsafe d/c plan, IV treatments appropriate due to intensity of illness or inability to take PO and Inpatient level of care appropriate due to severity of illness   Dispo: The patient is from: Home              Anticipated d/c is to: TBD              Anticipated d/c date is: 2 days              Patient currently is not medically stable to d/c.  Consultants:  Neurology Neurosurgery    Procedures:  None  Antimicrobials:  Anti-infectives (From admission, onward)   None     Subjective: Seen and examined at bedside and states that she is hurting in her right leg.  Has not had a bowel movement yet.  No chest pain, lightheadedness or dizziness.  No other concerns or complaints at this time.  Objective: Vitals:   02/27/20 1920 02/27/20 2311 02/28/20 0349 02/28/20 0815  BP: (!) 144/85 (!) 141/78 (!) 155/87 133/74  Pulse: 93 93 91 84  Resp: 18 18 16 20   Temp: 98.5 F (36.9 C) 98.6 F (37 C) 98.2 F (36.8 C) 98.6 F (37 C)  TempSrc: Oral Oral Oral Oral  SpO2: 94% 97% 98% 98%  Weight:      Height:       No intake or output data in the 24 hours ending 02/28/20 1110 Filed Weights   02/23/20 0915 02/24/20 2021  Weight: 61.7 kg 60 kg   Examination: Physical Exam:  Constitutional: Patient is a thin slightly ill-appearing Caucasian female currently in no acute distress but is complaining of some leg pain. Eyes: Lids and conjunctivae normal, sclerae anicteric  ENMT: External Ears, Nose appear normal. Grossly normal hearing.  Neck: Appears normal, supple, no cervical masses, normal ROM, no appreciable thyromegaly; no JVD Respiratory: Diminished to auscultation bilaterally, no wheezing, rales, rhonchi or crackles. Normal respiratory effort and patient is not tachypenic. No accessory muscle use.  Cardiovascular: RRR, no murmurs / rubs / gallops.  Is mild extremity edema Abdomen: Soft,  non-tender, non-distended. Bowel sounds positive.  GU: Deferred. Musculoskeletal: No clubbing / cyanosis of digits/nails. No joint deformity upper and lower extremities. Skin: No rashes, lesions, ulcers on limited skin evaluation but does have a right ankle tattoo in the right arm tattoo. No induration; Warm and dry.  Neurologic: CN 2-12 grossly intact with no focal deficits. Romberg sign and cerebellar reflexes not assessed.  Psychiatric: Normal judgment and insight. Alert and oriented x 3. Normal mood and appropriate affect.   Data Reviewed: I have personally reviewed following labs and imaging studies  CBC: Recent Labs  Lab 02/24/20 0855 02/25/20 0416 02/26/20 0741 02/27/20 0512 02/28/20 1010  WBC 8.5 6.8 3.5* 4.0 5.5  NEUTROABS 5.7 5.3 2.1 2.4 3.7  HGB 12.1 10.4* 10.8* 10.4* 10.7*  HCT 38.8 34.2* 34.8* 32.9* 33.6*  MCV 96.8 94.7 93.8 94.3 93.1  PLT 181 127* 139* 125* 143*   Basic Metabolic Panel: Recent Labs  Lab 02/23/20 0944 02/24/20 0855 02/25/20 0416 02/26/20 0741 02/27/20 0512  NA 137 141 136 135 135  K 4.4 3.9 3.6 3.6 3.9  CL 99 107 106 105 104  CO2 21* 24 22 21* 24  GLUCOSE 325* 97 157* 170* 216*  BUN 30* 23 17 15 13   CREATININE 0.94 0.86 0.81 0.84 0.80  CALCIUM 10.1 8.8* 8.2* 8.5* 8.6*  MG  --  2.1 1.9 1.9 1.9  PHOS  --  3.9 3.6 3.3 3.5   GFR: Estimated Creatinine Clearance: 58.9 mL/min (by C-G formula based on SCr of 0.8 mg/dL). Liver Function Tests: Recent Labs  Lab 02/23/20 0944 02/24/20 0855 02/25/20 0416 02/26/20 0741 02/27/20 0512  AST 18 12* 18 15 20   ALT 22 16 17 17 17   ALKPHOS 144* 93 76 100 95  BILITOT 0.5 0.5 0.6 0.3 <0.1*  PROT 7.5 6.4* 6.2* 6.9 7.1  ALBUMIN 4.0 3.2* 2.6* 2.7* 2.6*   No results for input(s): LIPASE, AMYLASE in the last 168 hours. No results for input(s): AMMONIA in the last 168 hours. Coagulation Profile: Recent Labs  Lab 02/23/20 0944  INR 1.0   Cardiac Enzymes: No results for input(s): CKTOTAL, CKMB,  CKMBINDEX, TROPONINI in the last 168 hours. BNP (last 3 results) No results for input(s): PROBNP in the last 8760 hours. HbA1C: No  results for input(s): HGBA1C in the last 72 hours. CBG: Recent Labs  Lab 02/27/20 0636 02/27/20 1136 02/27/20 1604 02/27/20 2122 02/28/20 0621  GLUCAP 196* 292* 211* 214* 223*   Lipid Profile: No results for input(s): CHOL, HDL, LDLCALC, TRIG, CHOLHDL, LDLDIRECT in the last 72 hours. Thyroid Function Tests: No results for input(s): TSH, T4TOTAL, FREET4, T3FREE, THYROIDAB in the last 72 hours. Anemia Panel: Recent Labs    02/25/20 1311 02/26/20 0741  VITAMINB12 490  --   FOLATE  --  35.0  FERRITIN  --  30  TIBC  --  316  IRON  --  56  RETICCTPCT  --  1.8   Sepsis Labs: No results for input(s): PROCALCITON, LATICACIDVEN in the last 168 hours.  Recent Results (from the past 240 hour(s))  SARS Coronavirus 2 by RT PCR (hospital order, performed in [facility] hospital lab) Nasopharyngeal Nasopharyngeal Swab     Status: None   Collection Time: 02/23/20  8:07 PM   Specimen: Nasopharyngeal Swab  Result Value Ref Range Status   SARS Coronavirus 2 NEGATIVE NEGATIVE Final    Comment: (NOTE) SARS-CoV-2 target nucleic acids are NOT DETECTED.  The SARS-CoV-2 RNA is generally detectable in upper and lower respiratory specimens during the acute phase of infection. The lowest concentration of SARS-CoV-2 viral copies this assay can detect is 250 copies / mL. A negative result does not preclude SARS-CoV-2 infection and should not be used as the sole basis for treatment or other patient management decisions.  A negative result may occur with improper specimen collection / handling, submission of specimen other than nasopharyngeal swab, presence of viral mutation(s) within the areas targeted by this assay, and inadequate number of viral copies (<250 copies / mL). A negative result must be combined with clinical observations, patient history, and  epidemiological information.  Fact Sheet for Patients:   BoilerBrush.com.cy  Fact Sheet for Healthcare Providers: https://pope.com/  This test is not yet approved or  cleared by the [place] FDA and has been authorized for detection and/or diagnosis of SARS-CoV-2 by FDA under an Emergency Use Authorization (EUA).  This EUA will remain in effect (meaning this test can be used) for the duration of the COVID-19 declaration under Section 564(b)(1) of the Act, 21 U.S.C. section 360bbb-3(b)(1), unless the authorization is terminated or revoked sooner.  Performed at [facility], [address], [place], [place] [address]   Surgical PCR screen     Status: None   Collection Time: 02/28/20 12:25 AM   Specimen: Nasal Mucosa; Nasal Swab  Result Value Ref Range Status   MRSA, PCR NEGATIVE NEGATIVE Final   Staphylococcus aureus NEGATIVE NEGATIVE Final    Comment: (NOTE) The Xpert SA Assay (FDA approved for NASAL specimens in patients 32 years of age and older), is one component of a comprehensive surveillance program. It is not intended to diagnose infection nor to guide or monitor treatment. Performed at [facility], [address], [place], [place] [address]     RN Pressure Injury Documentation:     Estimated body mass index is 22.71 kg/m as calculated from the following:   Height as of this encounter:  (1.626 m).   Weight as of this encounter: 60 kg.  Malnutrition Type:  Nutrition Problem: Moderate Malnutrition Etiology: chronic illness (inflammatory polyradiculoneuropathy)   Malnutrition Characteristics:  Signs/Symptoms: mild fat depletion, moderate fat depletion, mild muscle depletion, moderate muscle depletion, severe muscle depletion   Nutrition Interventions:  Interventions: Ensure  Enlive (each supplement provides 350kcal and 20 grams of protein), Liberalize Diet, Education  Radiology  Studies: No results found.  Scheduled Meds: . Chlorhexidine Gluconate Cloth  6 each Topical Once  . enoxaparin (LOVENOX) injection  40 mg Subcutaneous Q24H  . feeding supplement (ENSURE ENLIVE)  237 mL Oral TID BM  . insulin aspart  0-15 Units Subcutaneous TID AC & HS  . insulin aspart  3 Units Subcutaneous TID WC  . insulin detemir  5 Units Subcutaneous Daily  . magnesium oxide  400 mg Oral BID  . methadone  20 mg Oral TID  . nortriptyline  25 mg Oral QHS  . pneumococcal 23 valent vaccine  0.5 mL Intramuscular Tomorrow-1000  . polyethylene glycol  17 g Oral BID  . senna-docusate  1 tablet Oral BID  . topiramate  100 mg Oral Daily  . topiramate  200 mg Oral QHS   Continuous Infusions: . Immune Globulin 10% 25 g (02/27/20 1433)    LOS: 5 days   Merlene Laughter, DO [facility] PAGER is on AMION  If 7PM-7AM, please contact night-coverage [email or web address]

## 2020-02-29 ENCOUNTER — Encounter (HOSPITAL_COMMUNITY): Payer: Self-pay | Admitting: Neurological Surgery

## 2020-02-29 DIAGNOSIS — R7989 Other specified abnormal findings of blood chemistry: Secondary | ICD-10-CM

## 2020-02-29 DIAGNOSIS — E871 Hypo-osmolality and hyponatremia: Secondary | ICD-10-CM

## 2020-02-29 LAB — GLUCOSE, CAPILLARY
Glucose-Capillary: 305 mg/dL — ABNORMAL HIGH (ref 70–99)
Glucose-Capillary: 273 mg/dL — ABNORMAL HIGH (ref 70–99)
Glucose-Capillary: 301 mg/dL — ABNORMAL HIGH (ref 70–99)
Glucose-Capillary: 315 mg/dL — ABNORMAL HIGH (ref 70–99)
Glucose-Capillary: 274 mg/dL — ABNORMAL HIGH (ref 70–99)

## 2020-02-29 LAB — COMPREHENSIVE METABOLIC PANEL
ALT: 65 U/L — ABNORMAL HIGH (ref 0–44)
AST: 79 U/L — ABNORMAL HIGH (ref 15–41)
Albumin: 2.5 g/dL — ABNORMAL LOW (ref 3.5–5.0)
Alkaline Phosphatase: 115 U/L (ref 38–126)
Anion gap: 7 (ref 5–15)
BUN: 17 mg/dL (ref 8–23)
CO2: 22 mmol/L (ref 22–32)
Calcium: 8.5 mg/dL — ABNORMAL LOW (ref 8.9–10.3)
Chloride: 102 mmol/L (ref 98–111)
Creatinine, Ser: 1.08 mg/dL — ABNORMAL HIGH (ref 0.44–1.00)
GFR calc Af Amer: >60 mL/min (ref >60)
GFR calc non Af Amer: 53 mL/min — ABNORMAL LOW (ref >60)
Glucose, Bld: 338 mg/dL — ABNORMAL HIGH (ref 70–99)
Potassium: 4.4 mmol/L (ref 3.5–5.1)
Sodium: 131 mmol/L — ABNORMAL LOW (ref 135–145)
Total Bilirubin: 0.5 mg/dL (ref 0.3–1.2)
Total Protein: 7.6 g/dL (ref 6.5–8.1)

## 2020-02-29 LAB — CBC WITH DIFFERENTIAL/PLATELET
Abs Immature Granulocytes: 0.03 10*3/uL (ref 0.00–0.07)
Basophils Absolute: 0 10*3/uL (ref 0.0–0.1)
Basophils Relative: 0 %
Eosinophils Absolute: 0 10*3/uL (ref 0.0–0.5)
Eosinophils Relative: 0 %
HCT: 30.1 % — ABNORMAL LOW (ref 36.0–46.0)
Hemoglobin: 9.7 g/dL — ABNORMAL LOW (ref 12.0–15.0)
Immature Granulocytes: 0 %
Lymphocytes Relative: 10 %
Lymphs Abs: 0.7 10*3/uL (ref 0.7–4.0)
MCH: 30.3 pg (ref 26.0–34.0)
MCHC: 32.2 g/dL (ref 30.0–36.0)
MCV: 94.1 fL (ref 80.0–100.0)
Monocytes Absolute: 0.5 10*3/uL (ref 0.1–1.0)
Monocytes Relative: 8 %
Neutro Abs: 5.9 10*3/uL (ref 1.7–7.7)
Neutrophils Relative %: 82 %
Platelets: 151 10*3/uL (ref 150–400)
RBC: 3.2 MIL/uL — ABNORMAL LOW (ref 3.87–5.11)
RDW: 13.7 % (ref 11.5–15.5)
WBC: 7.2 10*3/uL (ref 4.0–10.5)
nRBC: 0 % (ref 0.0–0.2)

## 2020-02-29 LAB — PHOSPHORUS: Phosphorus: 3.6 mg/dL (ref 2.5–4.6)

## 2020-02-29 LAB — VITAMIN B1: Vitamin B1 (Thiamine): 169.8 nmol/L (ref 66.5–200.0)

## 2020-02-29 LAB — MAGNESIUM: Magnesium: 1.8 mg/dL (ref 1.7–2.4)

## 2020-02-29 MED ORDER — MORPHINE SULFATE (PF) 2 MG/ML IV SOLN
1.0000 mg | INTRAVENOUS | Status: DC | PRN
  Administered 2020-02-29 – 2020-03-01 (×2): 1 mg via INTRAVENOUS
  Filled 2020-02-29 (×2): qty 1

## 2020-02-29 MED ORDER — INSULIN DETEMIR 100 UNIT/ML ~~LOC~~ SOLN
5.0000 [IU] | Freq: Two times a day (BID) | SUBCUTANEOUS | Status: DC
  Administered 2020-02-29 – 2020-03-03 (×7): 5 [IU] via SUBCUTANEOUS
  Filled 2020-02-29 (×8): qty 0.05

## 2020-02-29 MED ORDER — SODIUM CHLORIDE 0.9 % IV SOLN: INTRAVENOUS | Status: AC

## 2020-02-29 MED ORDER — INSULIN ASPART 100 UNIT/ML ~~LOC~~ SOLN
5.0000 [IU] | Freq: Three times a day (TID) | SUBCUTANEOUS | Status: DC
  Administered 2020-02-29 – 2020-03-03 (×7): 5 [IU] via SUBCUTANEOUS

## 2020-02-29 NOTE — Progress Notes (Signed)
Physical Therapy Treatment Patient Details Name: Monica [name] MRN: [id number] DOB: [birth year] Today's Date: 02/29/2020    History of Present Illness Monica [name] is a 69 y.o. female s/p concerns of worsening lower leg pain and inability to walk. 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  She is now s/p R sural nerve and R superficial quadriceps muscle biopsies on 02/28/20. Pt with medical history significant for chronic migraine, type 2 diabetes, chronic pain on methadone, PTSD    PT Comments    Pt demonstrating transfers with less assistance today.  She was apprehensive in regards to mobility post op, but she did well and was pleased with her mobility today.  Pt was premedicated before activity today and had good pain control during therapy.  Noted that pt and family have requested home at discharge.  If returning home, pt will need physical assistance available 24 hr/day at this time.  If that is not available would recommend SNF.  Additionally, pt may benefit from w/c as she is not able to ambulate household distances at this time.     Follow Up Recommendations  SNF;Home health PT;Supervision/Assistance - 24 hour (Noted pt/family would like home: spouse will need to be able to provide min-mod A if returning home, if not able then recommend SNF)     Equipment Recommendations  Wheelchair (measurements PT);3in1 (PT);Wheelchair cushion (measurements PT)    Recommendations for Other Services       Precautions / Restrictions Precautions Precautions: Fall    Mobility  Bed Mobility Overal bed mobility: Needs Assistance Bed Mobility: Supine to Sit;Sit to Supine     Supine to sit: Min assist;HOB elevated Sit to supine: Mod assist;HOB elevated   General bed mobility comments: Min A for R LE OOB and for both back to bed.  Increased time and cues for sequencing  Transfers Overall transfer level: Needs assistance Equipment used: Rolling walker (2  wheeled) Transfers: Sit to/from Stand Sit to Stand: From elevated surface;Min assist         General transfer comment: Min guard for steadying but assist to stabilize RW (had guarding of 2 due to prior notes, but only needed guarding): educated on placing hands on bed but pt prefered to hold RW.  PT stabilized RW.  Ambulation/Gait Ambulation/Gait assistance: Min guard Gait Distance (Feet): 4 Feet Assistive device: Rolling walker (2 wheeled) Gait Pattern/deviations: Step-to pattern;Shuffle Gait velocity: reduced   General Gait Details: Steps to chair and back to bed; cued for RW and posture; did fatigue easily; wanting to return to bed due to received morphine and was lethargic   Stairs             Wheelchair Mobility    Modified Rankin (Stroke Patients Only)       Balance Overall balance assessment: Needs assistance Sitting-balance support: Feet supported;No upper extremity supported Sitting balance-Leahy Scale: Fair     Standing balance support: Bilateral upper extremity supported;During functional activity Standing balance-Leahy Scale: Poor Standing balance comment: required RW                            Cognition Arousal/Alertness: Awake/alert (asleep initially (spouse reports received morphine), but able to be aroused) Behavior During Therapy: WFL for tasks assessed/performed Overall Cognitive Status: Within Functional Limits for tasks assessed  Exercises General Exercises - Lower Extremity Ankle Circles/Pumps: 10 reps;AAROM;Seated;Both Quad Sets: AROM;Both;10 reps;Supine Long Arc Quad: AAROM;Right;AROM;Left;10 reps;Seated    General Comments General comments (skin integrity, edema, etc.): Spouse in room; VSS      Pertinent Vitals/Pain Pain Assessment: Faces Faces Pain Scale: Hurts even more Pain Location: R thigh with initial movements, but then eased Pain Descriptors / Indicators:  Discomfort;Constant;Shooting;Sore Pain Intervention(s): Limited activity within patient's tolerance;Monitored during session;Premedicated before session    Home Living                      Prior Function            PT Goals (current goals can now be found in the care plan section) Acute Rehab PT Goals Patient Stated Goal: to return home with spouse PT Goal Formulation: With patient/family Time For Goal Achievement: 03/09/20 Progress towards PT goals: Progressing toward goals    Frequency    Min 3X/week      PT Plan Current plan remains appropriate    Co-evaluation              AM-PAC PT "6 Clicks" Mobility   Outcome Measure  Help needed turning from your back to your side while in a flat bed without using bedrails?: A Little Help needed moving from lying on your back to sitting on the side of a flat bed without using bedrails?: A Little Help needed moving to and from a bed to a chair (including a wheelchair)?: A Little Help needed standing up from a chair using your arms (e.g., wheelchair or bedside chair)?: A Little Help needed to walk in hospital room?: A Little Help needed climbing 3-5 steps with a railing? : A Lot 6 Click Score: 17    End of Session Equipment Utilized During Treatment: Gait belt Activity Tolerance: Patient limited by fatigue Patient left: in bed;with call bell/phone within reach;with family/visitor present;with bed alarm set Nurse Communication: Mobility status;Other (comment) (pt safe to transfer to bsc with staff using RW) PT Visit Diagnosis: Unsteadiness on feet (R26.81);Muscle weakness (generalized) (M62.81);Difficulty in walking, not elsewhere classified (R26.2)     Time: 1430-1456 PT Time Calculation (min) (ACUTE ONLY): 26 min  Charges:  $Therapeutic Exercise: 8-22 mins $Therapeutic Activity: 8-22 mins                     Anise Salvo, PT Acute Rehab Services Pager [phone] [facility] [phone]     Rayetta Humphrey 02/29/2020, 3:36 PM

## 2020-02-29 NOTE — Progress Notes (Signed)
PROGRESS NOTE    Monica [name]  ZOX:[id number] DOB: 1950-11-10 DOA: 02/23/2020 PCP: [name], FNP   Brief Narrative: HPI per Dr. Benita Gutter on 02/23/20 Monica [name] is a 69 y.o. female with medical history significant for chronic migraine, type 2 diabetes, chronic pain on methadone, PTSD who presents with concerns of worsening lower leg pain.  Patient has been having bilateral lower leg pain for the past 8 months and was referred to neurology by her PMR physician. She has had extensive work-up including an MRI of the lumbar spine, EMG and an LP. EMG noted to have right L3 radiculopathy and right L5 radiculopathy.  LP noted to have few cells but significantly elevated CSF protein consistent with inflammatory polyradiculoneuropathy and IVIG was recommended. She was recently given a week of steroids at the beginning of July.  For the past 2 to 3 weeks her pain has acutely worsened and she has not been able to walk and now has been wheelchair-bound. She describes the pain as being sharp, electric-like with numbness and tingling of her bilateral feet.  It is worse when she sits down.   Her left leg is usually better than her right but recently it also started to have worsening symptoms.  She also feels as if her waistline is actually up at her lower ribs.  Also has been having decreased bowel movement since symptoms started 8 months ago and has only been able to have 1 bowel movement per month.  Her last bowel movement was 2 days ago after taking Dulcolax.  Denies any issues with urine output.  Denies any abdominal pain.  No nausea or vomiting.  She was sent to ED by outpatient neurology for IVIG due to progressively worsening symptoms.   ED Course: She was afebrile, normotensive and mildly tachycardic up to 130.  She has mild leukocytosis of 11.9 although recently was on prednisone.  Glucose of 325 with anion gap of 17, CO2 of 21.  **Interim History  Neurology evaluating and recommending  several laboratory studies and recommended L-spine MRI with contrast however she does not want to have this done.  Neurology is now starting the patient on IVIG daily empirically of 2 g/kg divided over 5 doses  02/25/20: Neurology recommending a possible nerve muscle biopsy and recommending continuing 5 days of IVIG and Dr. Wilford Corner recommends to contact Neurosurgery to schedule Bx. We will titrate up her morphine however will need to be judicious given that she is on chronic pain patient and already on methadone.  PT and OT unable to work with the patient again today given her pain.  Neurosurgery was consulted and they are planning for sural nerve biopsy on Monday and they have made the patient n.p.o. after midnight Sunday in preparation.  02/26/2020: Continues to complain of some morning pain and states that whenever she gets throughout the day she improves.  Has not had a bowel movement in several days now.  Understand that she will be going for biopsy on Monday for the right sural nerve  02/27/20: Continues to complain of some pain and ready to get her biopsy over with.  Still not had a bowel movement but states that this happens to her frequently and that she can go a week without having a bowel movement usually.  Labs are relatively stable and her acidosis is improved.  02/28/20: Patient still complaining of some right leg pain.  She is going for her biopsy later this afternoon.  She will complete  her fifth dose of IVIG today.  Also states that she started not had a bowel movement yet but does not want to try a suppository just yet.  02/29/20: Patient is status post biopsy and feels that she has more movement in her leg and feels a little bit better today however pain control is still an issue.  We will attempt to wean her off of the IV morphine she has been getting and have cut back on the frequency.  The OT still recommending skilled nursing facility but patient opts to go home.  Will need to ensure  that she had adequate pain control prior to safe discharge disposition given that she is a chronic pain patient.  Her biopsy results of the right sural nerve biopsy in the right superficial quadriceps muscle biopsy are still pending considering starting her on steroids however have held off given her improvement in her leg and because she is an uncontrolled diabetic.  She is status post 5 days of IVIG and neurology recommends to continue PT OT and pain management and have her follow-up outpatient with neurology as well as follow-up the results of the more muscle biopsy and thiamine level.  LFTs were also elevated as well as a slight AKI and she did have a hyponatremia so she was started back on IV fluids. Anticipating discharging the next 24 to 48 hours if her pain is able to be controlled.   Assessment & Plan:   Principal Problem:   Inflammatory polyneuropathy (HCC) Active Problems:   Chronic pain   Uncontrolled type 2 diabetes mellitus with hyperglycemia, without long-term current use of insulin (HCC)   Migraine   Malnutrition of moderate degree   Neuropathy  Inflammatory polyradiculoneuropathy -Differential diagnosis include idiopathic lumbosacral radicular plexus neuropathy/diabetic amyotrophy per neurology; feel it is less likely AIDP more likely to be idiopathic lumbosacral radicular plexus neuropathy but then they also feel that other differentials or to consider nondiabetes related sensory or motor neuropathies -Recent lumbar CT spine showed "Mild curvature convex to the left. 2 mm chronic anterolisthesis L4-5. Previous posterior decompression and discectomy at L4-5. No compressive stenosis. Bilateral facet osteoarthritis. L5-S1 facet osteoarthritis with 2 mm of anterolisthesis. No compressive stenosis. Mild non-compressive disc bulges at L2-3 and L3-4. Question if the nerve roots of the cauda equina are minimally larger than normal, some measuring up to 1-1.2 mm in diameter, raising the  possibility of some neuropathic process. I do not think this potential finding is definitely valid." -MRI Pelvis on 02/18/20 showed "No acute findings or explanation for the patient's symptoms.  Mild degenerative and postsurgical changes within the lumbar spine associated with a convex left scoliosis. No evidence of sacral nerve root encroachment. Mild muscular asymmetries as described." -Neurology recommended getting an MRI of the L-spine with contrast however she does not want to have a repeat MRI and only where she will get it done is under general anesthesia labs pending per neuro  -Urinalysis not done despite being ordered twice -B12 and folate level were normal -Thiamine levels pending still -B6 levels, pending still -Angiotensin-converting enzyme 53 -Copper, was 106 -SSA SSB antibodie was less than 0.2 -Antinuclear antibodies were positive IFA and she had a homogeneous pattern 1:160 -ANCA negative -Copper levels in the serum still pending -Heavy metal screen was negative and had 5 of arsenic, less than 1 of lead, and less than 1.0 mercury -Hemoglobin A1c was 8.6 -Neuro to decide on IVIG after lab results and second opinion from outpatient neurologist and they  have decided to initiate IVIG; and today is day 5 out of 5 of her IVIG -Obtain PT/OT and they initially recommended home health; they are now recommending SNF versus home health PT and OT -will give PRN morphine for any severe pain not controlled with her chronic pain regimen -Continue supportive care with normal saline at 75 MLS per hour as it is resumed given her renal insufficiency and mild hyponatremia -Continue with bowel regimen bisacodyl 5 mg p.o. daily as needed for mild constipation -Neurology now recommending obtaining a neurosurgery consultation for nerve muscle biopsy given Dr. Bess Harvest discussion with Dr. Daisy Blossom; Dr. Wilford Corner recommends obtaining a Right Quadirceps and Right Sural Nerve Bx -Neurosurgery consulted and they  took the patient for a sural nerve biopsy as well as a right quadricep superficial biopsy which results are pending -Patient is able to move her right leg little bit more as little bit more sensation able to wiggle her toes however pain control still is challenging if she continues to complain of some right leg pain.  Chronic pain -Continue methadone 20 mg p.o. 3 times daily, nortriptyline 25 mg p.o. nightly, prn tizanidine dose p.o. every 6 hours as needed for muscle spasm -Continue judicious use of her pain medications and she follows with Dr. Whitney Post for pain -She is currently on IV morphine for breakthrough and getting 1 mg every 3 hours as needed we will titrate up to 1 mg every 2 hours as needed and will wean back to 1 mg every 3 hours as needed given that we will need to try and get her off of the IV morphine for discharge -PT OT still recommending skilled nursing facility however patient opts to go home and anticipating discharging home in the next 24 to 48 hours if pain control is adequately managed  Uncontrolled Type 2 diabetes with hyperglycemia -Glucose of 325 with anion gap of 17, CO2 of 21 on admission: Metabolic acidosis now improved with a CO2 of 24, anion gap of 10, chloride level of 107 -She was given Will give one time dose of 10 units of Novolog and place on moderate SSI -Added 5 units of Levemir subcu daily changed to twice daily -Hemoglobin A1c is 8.6 -Continuous IV NS 75cc fluids will now be stopped -CBGs ranging from 115-305 -Holding home Metformin 1000 mg po BID and Dapagliflozin 1 tab po daily  -we will consult diabetes education coordinator for further evaluation recommendations -Diabetes education coordinator recommending increasing the 3 NovoLog units to 6 number units NovoLog 3 times daily with meal coverage with parameters of giving if she eats at least 50% of her meal and her glucose is at least 80 mg/dL  Chronic migraine -continue Tapentadol PRN  -Continue  Topamax  Leukocytosis -Improved and patient's WBC trended down from 11.9 is now trending back up and today it is 7.2 -Continue to monitor and trend and repeat CBC in a.m.  HLD -Holding Atorvastatin 10 mg po qHS and may resume in the a.m.  Normocytic Anemia -Patient hemoglobin/hematocrit went from 13.6/43.7 and has trended down all the way to 9.7/30.1 -Checked anemia panel and showed an iron level of 56, U IBC of 260, TIBC 316, saturation ratios of 18%, ferritin level of 30, folate of 35.0 and B12 was done earlier and was normal at 490 -Monitor for signs and symptoms of bleeding; currently no overt bleeding noted -Repeat CBC in a.m.  Constipation -Likely opiate induced -Started senna docusate 1 tab p.o. twice daily, MiraLAX 17 g p.o. twice  daily, -we will continue bisacodyl 5 mg p.o. daily as needed for mild constipation and may need a bisacodyl suppository however patient does not want suppository and enema right now -Continue to monitor and if not improving may try enemas; still has not had a bowel movement and did not want to try enema today but may need enema in the a.m.  Metabolic Acidosis, improved -Patient CO2 is now 22, anion gap is 7, chloride level is now 102 -IV fluid hydration now resumed for 12 hours  -Repeat CMP in a.m.  Malnutrition of moderate degree/nonsevere malnutrition in the context of chronic illness -Dietitian was consulted for further evaluation recommendations and we remove the heart healthy diet restriction and she has been started on Ensure Enlive p.o. 3 times daily and she is also provided with a "high-calorie, high-protein nutrition therapy" handout  Abnormal LFTs  -AST and ALT both elevated from yesterday AST went from 23 and is now 79 -ALT went from 20 is not 65 When check upper quadrant ultrasound as well as acute hepatitis panel and continue monitor and trend LFTs carefully -Likely this is reactive but will need to rule out other  etiologies  Hyponatremia -Mild as patient's sodium went from 136 now 131 -We will start gentle IV fluid hydration with normal saline at 75 MLS per hour for 12 hours -Continue monitor and trend repeat CMP in a.m.  Renal Insufficiency -Patient's BUNs/creatinine went from 13/0.80 and is now 17/1.08  -Started gentle IV for hydration as above with normal saline at 75 mL's per hour for 12 hours -Continue monitor and trend and avoid nephrotoxic medications, contrast dyes, hypotension and renally dose medications -Repeat CMP in a.m. continue monitor renal function carefully   DVT prophylaxis: Enoxaparin 40 mg subcu every 24 Code Status: FULL CODE Family Communication: No family present at bedside  Disposition Plan: Pending further evaluation and clearance by neurology and neurosurgery; she is getting a biopsy today and she will be completing her IVIG today.  PT OT recommending SNF versus home health OT now.  Status is: Inpatient  Remains inpatient appropriate because:Unsafe d/c plan, IV treatments appropriate due to intensity of illness or inability to take PO and Inpatient level of care appropriate due to severity of illness   Dispo: The patient is from: Home              Anticipated d/c is to: TBD              Anticipated d/c date is: 2 days              Patient currently is not medically stable to d/c.  Consultants:  Neurology Neurosurgery    Procedures:  None  Antimicrobials:  Anti-infectives (From admission, onward)   Start     Dose/Rate Route Frequency Ordered Stop   02/29/20 0600  ceFAZolin (ANCEF) IVPB 2g/100 mL premix        2 g 200 mL/hr over 30 Minutes Intravenous On call to O.R. 02/28/20 1457 02/28/20 1607   02/28/20 1830  ceFAZolin (ANCEF) IVPB 1 g/50 mL premix        1 g 100 mL/hr over 30 Minutes Intravenous Every 8 hours 02/28/20 1819 02/29/20 0628   02/28/20 1636  bacitracin 50,000 Units in sodium chloride 0.9 % 500 mL irrigation  Status:  Discontinued           As needed 02/28/20 1636 02/28/20 1719     Subjective: Seen and examined at bedside and states that her leg  had more movement in it but is still hurting.  Still has not had a bowel movement.  Able to wiggle her toes and she is happy about that.  No lightheadedness or dizziness.  PT OT still recommending skilled nursing facility but she wants to go home.  No nausea or vomiting.  No other concerns or complaints at this time.  Objective: Vitals:   02/29/20 0200 02/29/20 0357 02/29/20 0819 02/29/20 1109  BP: 103/63 (!) 97/58 118/63 (!) 114/59  Pulse: 78 71 76 88  Resp: Temp: 99.2 F (37.3 C) 97.9 F (36.6 C) 97.8 F (36.6 C) 98.3 F (36.8 C)  TempSrc: Oral  Oral Oral  SpO2: 98% 97% 99% 99%  Weight:      Height:        Intake/Output Summary (Last 24 hours) at 02/29/2020 1733 Last data filed at 02/29/2020 1450 Gross per 24 hour  Intake 1320 ml  Output --  Net 1320 ml   Filed Weights   02/23/20 0915 02/24/20 2021  Weight: 61.7 kg 60 kg   Examination: Physical Exam:  Constitutional: The patient is a thin Caucasian female currently in NAD and appears calm happy that she is able to move her leg little bit more but still complains of some pain Eyes: Lids and conjunctivae normal, sclerae anicteric  ENMT: External Ears, Nose appear normal. Grossly normal hearing. Neck: Appears normal, supple, no cervical masses, normal ROM, no appreciable thyromegaly; no JVD Respiratory: Diminished to auscultation bilaterally, no wheezing, rales, rhonchi or crackles. Normal respiratory effort and patient is not tachypenic. No accessory muscle use.  Unlabored breathing Cardiovascular: RRR, no murmurs / rubs / gallops. S1 and S2 auscultated.  Minimal extremity edema Abdomen: Soft, non-tender, non-distended.  Bowel sounds positive.  GU: Deferred. Musculoskeletal: No clubbing / cyanosis of digits/nails. No joint deformity upper and lower extremities.  Skin: No rashes, lesions, ulcers on limited  skin evaluation. No induration; Warm and dry.  Neurologic: CN 2-12 grossly intact with no focal deficits. Romberg sign and cerebellar reflexes not assessed.  Has a little bit more sensation on the right foot Psychiatric: Normal judgment and insight. Alert and oriented x 3. Normal mood and appropriate affect.    Data Reviewed: I have personally reviewed following labs and imaging studies  CBC: Recent Labs  Lab 02/25/20 0416 02/26/20 0741 02/27/20 0512 02/28/20 1010 02/29/20 0335  WBC 6.8 3.5* 4.0 5.5 7.2  NEUTROABS 5.3 2.1 2.4 3.7 5.9  HGB 10.4* 10.8* 10.4* 10.7* 9.7*  HCT 34.2* 34.8* 32.9* 33.6* 30.1*  MCV 94.7 93.8 94.3 93.1 94.1  PLT 127* 139* 125* 143* 151   Basic Metabolic Panel: Recent Labs  Lab 02/25/20 0416 02/26/20 0741 02/27/20 0512 02/28/20 1010 02/29/20 0335  NA 136 135 135 136 131*  K 3.6 3.6 3.9 4.2 4.4  CL 106 105 104 104 102  CO2 22 21* GLUCOSE 157* 170* 216* 159* 338*  BUN CREATININE 0.81 0.84 0.80 0.86 1.08*  CALCIUM 8.2* 8.5* 8.6* 9.4 8.5*  MG 1.9 1.9 1.9 2.0 1.8  PHOS 3.6 3.3 3.5 4.0 3.6   GFR: Estimated Creatinine Clearance: 43.6 mL/min (A) (by C-G formula based on SCr of 1.08 mg/dL (H)). Liver Function Tests: Recent Labs  Lab 02/25/20 0416 02/26/20 0741 02/27/20 0512 02/28/20 1010 02/29/20 0335  AST 79*  ALT 65*  ALKPHOS 76 100 95 117 115  BILITOT  0.6 0.3 <0.1* 0.6 0.5  PROT 6.2* 6.9 7.1 7.9 7.6  ALBUMIN 2.6* 2.7* 2.6* 2.8* 2.5*   No results for input(s): LIPASE, AMYLASE in the last 168 hours. No results for input(s): AMMONIA in the last 168 hours. Coagulation Profile: Recent Labs  Lab 02/23/20 0944  INR 1.0   Cardiac Enzymes: No results for input(s): CKTOTAL, CKMB, CKMBINDEX, TROPONINI in the last 168 hours. BNP (last 3 results) No results for input(s): PROBNP in the last 8760 hours. HbA1C: No results for input(s): HGBA1C in the last 72 hours. CBG: Recent Labs  Lab  02/28/20 1828 02/28/20 2111 02/29/20 0607 02/29/20 0819 02/29/20 1108  GLUCAP 142* 260* 305* 273* 301*   Lipid Profile: No results for input(s): CHOL, HDL, LDLCALC, TRIG, CHOLHDL, LDLDIRECT in the last 72 hours. Thyroid Function Tests: No results for input(s): TSH, T4TOTAL, FREET4, T3FREE, THYROIDAB in the last 72 hours. Anemia Panel: No results for input(s): VITAMINB12, FOLATE, FERRITIN, TIBC, IRON, RETICCTPCT in the last 72 hours. Sepsis Labs: No results for input(s): PROCALCITON, LATICACIDVEN in the last 168 hours.  Recent Results (from the past 240 hour(s))  SARS Coronavirus 2 by RT PCR (hospital order, performed in [facility] hospital lab) Nasopharyngeal Nasopharyngeal Swab     Status: None   Collection Time: 02/23/20  8:07 PM   Specimen: Nasopharyngeal Swab  Result Value Ref Range Status   SARS Coronavirus 2 NEGATIVE NEGATIVE Final    Comment: (NOTE) SARS-CoV-2 target nucleic acids are NOT DETECTED.  The SARS-CoV-2 RNA is generally detectable in upper and lower respiratory specimens during the acute phase of infection. The lowest concentration of SARS-CoV-2 viral copies this assay can detect is 250 copies / mL. A negative result does not preclude SARS-CoV-2 infection and should not be used as the sole basis for treatment or other patient management decisions.  A negative result may occur with improper specimen collection / handling, submission of specimen other than nasopharyngeal swab, presence of viral mutation(s) within the areas targeted by this assay, and inadequate number of viral copies (<250 copies / mL). A negative result must be combined with clinical observations, patient history, and epidemiological information.  Fact Sheet for Patients:   BoilerBrush.com.cy  Fact Sheet for Healthcare Providers: https://pope.com/  This test is not yet approved or  cleared by the [place] FDA and has been authorized for  detection and/or diagnosis of SARS-CoV-2 by FDA under an Emergency Use Authorization (EUA).  This EUA will remain in effect (meaning this test can be used) for the duration of the COVID-19 declaration under Section 564(b)(1) of the Act, 21 U.S.C. section 360bbb-3(b)(1), unless the authorization is terminated or revoked sooner.  Performed at [facility], [address], [place], [place] [address]   Surgical PCR screen     Status: None   Collection Time: 02/28/20 12:25 AM   Specimen: Nasal Mucosa; Nasal Swab  Result Value Ref Range Status   MRSA, PCR NEGATIVE NEGATIVE Final   Staphylococcus aureus NEGATIVE NEGATIVE Final    Comment: (NOTE) The Xpert SA Assay (FDA approved for NASAL specimens in patients 30 years of age and older), is one component of a comprehensive surveillance program. It is not intended to diagnose infection nor to guide or monitor treatment. Performed at [facility], [address], [place], [place] [address]     RN Pressure Injury Documentation:     Estimated body mass index is 22.71 kg/m as calculated from the following:   Height as of this  encounter: 5\' 4"  (1.626 m).   Weight as of this encounter: 60 kg.  Malnutrition Type:  Nutrition Problem: Moderate Malnutrition Etiology: chronic illness (inflammatory polyradiculoneuropathy)   Malnutrition Characteristics:  Signs/Symptoms: mild fat depletion, moderate fat depletion, mild muscle depletion, moderate muscle depletion, severe muscle depletion   Nutrition Interventions:  Interventions: Ensure Enlive (each supplement provides 350kcal and 20 grams of protein), Liberalize Diet, Education  Radiology Studies: No results found.  Scheduled Meds:  enoxaparin (LOVENOX) injection  40 mg Subcutaneous Q24H   feeding supplement (ENSURE ENLIVE)  237 mL Oral TID BM   insulin aspart  0-15 Units Subcutaneous TID AC & HS   insulin aspart  5 Units Subcutaneous TID WC   insulin detemir  5  Units Subcutaneous BID   magnesium oxide  400 mg Oral BID   methadone  20 mg Oral TID   nortriptyline  25 mg Oral QHS   pneumococcal 23 valent vaccine  0.5 mL Intramuscular Tomorrow-1000   polyethylene glycol  17 g Oral BID   senna  1 tablet Oral BID   sodium chloride flush  3 mL Intravenous Q12H   topiramate  100 mg Oral Daily   topiramate  200 mg Oral QHS   Continuous Infusions:  sodium chloride 250 mL (02/28/20 1844)   0.9 % NaCl with KCl 20 mEq / L 75 mL/hr at 02/28/20 1853    LOS: 6 days   03/01/20, DO [facility] PAGER is on AMION  If 7PM-7AM, please contact night-coverage [email or web address]

## 2020-02-29 NOTE — Progress Notes (Signed)
Inpatient Diabetes Program Recommendations  AACE/ADA: New Consensus Statement on Inpatient Glycemic Control (2015)  Target Ranges:  Prepandial:   less than 140 mg/dL      Peak postprandial:   less than 180 mg/dL (1-2 hours)      Critically ill patients:  140 - 180 mg/dL   Lab Results  Component Value Date   GLUCAP 301 (H) 02/29/2020   HGBA1C 8.6 (H) 02/23/2020    Review of Glycemic Control Results for Monica [name], Monica [name] (MRN [id number]) as of 02/29/2020 14:53  Ref. Range 02/28/2020 18:28 02/28/2020 21:11 02/29/2020 06:07 02/29/2020 08:19 02/29/2020 11:08  Glucose-Capillary Latest Ref Range: 70 - 99 mg/dL 505 (H) 697 (H) 948 (H) 273 (H) 301 (H)   Diabetes history: Type 2 DM Outpatient Diabetes medications: Metformin 1000 mg BID Current orders for Inpatient glycemic control: Novolog 0-15 units TID, Novolog 3 units TID, Levemir 5 units BID Decadron 4 mg x 1  Inpatient Diabetes Program Recommendations:    Glucose trends increased related to administration of Decadron yesterday while intra-op.  Also, did not received 1200 correction at scheduled time due to early AM administration. Anticipate trends to remain increased until next scheduled dose of correction, however now too close to give one time dose.   Could consider increasing Novolog 6 units TID (assuming patient is consuming >50% of meal).   Thanks, Lujean Rave, MSN, RNC-OB Diabetes Coordinator [phone] (8a-5p)

## 2020-02-29 NOTE — Progress Notes (Addendum)
NEUROLOGY PROGRESS NOTE   Subjective: Feels that she has significantly improved.  Is very happy that she now can feel both legs.  She also states now she can wiggle and move her right toes and ankle.  Exam: Vitals:   02/29/20 0357 02/29/20 0819  BP: (!) 97/58 118/63  Pulse: 71 76  Resp: 18 18  Temp: 97.9 F (36.6 C) 97.8 F (36.6 C)  SpO2: 97% 99%   Neuro:  Mental Status: Alert, oriented, thought content appropriate.  Speech fluent without evidence of aphasia.  Able to follow 3 step commands without difficulty. Cranial Nerves: II:  Visual fields grossly normal,  III,IV, VI: ptosis not present, extra-ocular motions intact bilaterally pupils equal, round, reactive to light and accommodation V,VII: smile symmetric, facial light touch sensation normal bilaterally VIII: hearing normal bilaterally IX,X: Palate rises midline XI: bilateral shoulder shrug XII: midline tongue extension Motor: Bilateral upper extremities 5/5.  Left lower extremity 5/5.  Right lower extremity 2/5 as she is unable to lift it off the bed, plantarflexion 5/5 and dorsiflexion 3/5 Tone and bulk:normal tone throughout; no atrophy noted Sensory: Intact throughout however bilateral legs are less than upper extremity.  She states that the left leg has more sensation than the right but there is very little difference. Deep Tendon Reflexes: 2+ bilateral biceps and triceps, 1+ bilateral brachioradialis.  Absent plantar reflexes bilaterally   Medications:  Prior to Admission:  Medications Prior to Admission  Medication Sig Dispense Refill Last Dose  . bisacodyl (BISACODYL) 5 MG EC tablet Take 5 mg by mouth daily as needed for mild constipation or moderate constipation.    02/22/2020 at Unknown time  . dapagliflozin propanediol (FARXIGA) 10 MG TABS tablet Take 1 tablet (10 mg total) by mouth daily before breakfast. 30 tablet 2 02/23/2020 at am  . magnesium oxide (MAG-OX) 400 (241.3 Mg) MG tablet Take 1 tablet (400 mg  total) by mouth 2 (two) times daily. 60 tablet 2 02/23/2020 at am  . metFORMIN (GLUCOPHAGE) 500 MG tablet Take 2 tablets (1,000 mg total) by mouth 2 (two) times daily. 360 tablet 1 02/23/2020 at am  . methadone (DOLOPHINE) 10 MG tablet Take 2 tablets (20 mg total) by mouth 3 (three) times daily. 180 tablet 0 02/23/2020 at 1400  . nortriptyline (PAMELOR) 25 MG capsule TAKE 1 CAPSULE BY MOUTH AT BEDTIME. (Patient taking differently: Take 25 mg by mouth at bedtime. ) 30 capsule 0 02/22/2020 at pm  . ondansetron (ZOFRAN) 4 MG tablet Take 1 tablet (4 mg total) by mouth every 8 (eight) hours as needed for nausea or vomiting. 4 tablet 0 unk at unk  . tapentadol (NUCYNTA) 50 MG tablet Take 1 tablet (50 mg total) by mouth every 12 (twelve) hours as needed. (Patient taking differently: Take 50 mg by mouth every 12 (twelve) hours as needed (for breakthrough migraines). ) 60 tablet 0 Past Week at Unknown time  . tiZANidine (ZANAFLEX) 2 MG tablet TAKE (1) TABLET EVERY SIX HOURS AS NEEDED FOR MUSCLE SPASMS. (Patient taking differently: Take 2 mg by mouth every 6 (six) hours as needed for muscle spasms. ) 60 tablet 1 Past Week at Unknown time  . topiramate (TOPAMAX) 50 MG tablet TAKE 2 TABLETS IN THE MORNING AND 4 TABLETS AT BEDTIME. (Patient taking differently: Take 100-200 mg by mouth See admin instructions. Take 100 mg by mouth in the morning and 200 mg at bedtime) 180 tablet 0 02/23/2020 at am  . ALPRAZolam (XANAX) 0.25 MG tablet Take 1-2 tabs (  0.'25mg'$ -0.'50mg'$ ) 30-60 minutes before procedure. May repeat if needed.Do not drive. (Patient not taking: Reported on 02/23/2020) 4 tablet 0 Not Taking at Unknown time  . atorvastatin (LIPITOR) 10 MG tablet Take 1 tablet (10 mg total) by mouth daily. (Patient not taking: Reported on 02/23/2020) 30 tablet 2 Not Taking at Unknown time  . blood glucose meter kit and supplies KIT Dispense based on patient and insurance preference. Use up to four times daily as directed. (FOR ICD-9 250.00,  250.01). 1 each 0   . predniSONE (DELTASONE) 20 MG tablet Take 3 tablets (60 mg total) by mouth daily with breakfast. First dose may be taken at anytime (Patient not taking: Reported on 02/23/2020) 15 tablet 0 Not Taking at Unknown time   Scheduled: . enoxaparin (LOVENOX) injection  40 mg Subcutaneous Q24H  . feeding supplement (ENSURE ENLIVE)  237 mL Oral TID BM  . insulin aspart  0-15 Units Subcutaneous TID AC & HS  . insulin aspart  3 Units Subcutaneous TID WC  . insulin detemir  5 Units Subcutaneous BID  . magnesium oxide  400 mg Oral BID  . methadone  20 mg Oral TID  . nortriptyline  25 mg Oral QHS  . pneumococcal 23 valent vaccine  0.5 mL Intramuscular Tomorrow-1000  . polyethylene glycol  17 g Oral BID  . senna  1 tablet Oral BID  . sodium chloride flush  3 mL Intravenous Q12H  . topiramate  100 mg Oral Daily  . topiramate  200 mg Oral QHS   Continuous: . sodium chloride 250 mL (02/28/20 1844)  . 0.9 % NaCl with KCl 20 mEq / L 75 mL/hr at 02/28/20 1853    Pertinent Labs/Diagnostics: Glucose 338 elevated from yesterday which was 159.  Noted that glucose as been erratic. Creatinine 1.08  B12 and folate level were normal -Thiamine levels pending -B6 levels, pending -Angiotensin-converting enzyme 53 -Copper, was 106 -SSA SSB antibodie was less than 0.2 -Antinuclear antibodies were positive IFA and she had a homogeneous pattern 1:160 -ANCA negative -Copper levels in the serum still pending -Heavy metal screen was negative and had 5 of arsenic, less than 1 of lead, and less than 1.0 mercury -Hemoglobin A1c was 8.6   Etta Quill PA-C [facility] [phone]   NEUROHOSPITALIST ADDENDUM Performed a face to face diagnostic evaluation.   Obtain brief history and examination patient has 2/5 strength in right upper extremity 4/5 strength in left lower extremity.  Bilateral upper extremities 5/5 strength.  Absent reflexes in bilateral lower  extremities.  Assessment:  69 year old female uncontrolled diabetes, chronic pain, multiple back surgeries presenting with progressive painful weakness of right greater than left leg along with numbness and tingling that has been ongoing for about 8 months.   MRI L-spine on 2021 was negative for spinal stenosis and C-spine also negative for high-grade canal stenosis.  CT myelogram on July 2021 also performed. 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  Underwent LP CSF findings suggestive of elevated protein 123, however in the setting of elevated glucose of 180 in CSF can often occur in severely under control diabetes.  Differential diagnosis of diabetic amyotrophy/ idiopathic lumbosacral radicular plexus neuropathy vs chronic inflammatory polyradiculoneuropathy was considered and patient empirically treated with 5 days of IVIG.  Patient underwent right sural nerve biopsy as well as muscle biopsy.  Patient states that she is showing some improvement especially with sensation and moving her right leg.  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       02/29/2020, 10:28 AM

## 2020-02-29 NOTE — Plan of Care (Signed)

## 2020-02-29 NOTE — Progress Notes (Signed)
Patient ID: Monica [name], female   DOB: 04-Jun-1951, 69 y.o.   MRN: [id number] Looks good POD1, in good spirits, incisions good, no new N, wiggling toes better, will check on incisions every few days

## 2020-02-29 NOTE — Progress Notes (Signed)
OT Cancellation Note  Patient Details Name: Monica [name] MRN: [id number] DOB: 1950/09/09   Cancelled Treatment:    Reason Eval/Treat Not Completed: Patient declined, no reason specified Attempted to see pt this date. Pt currently finishing lunch and requested OT return later after she finishes lunch and gets pain medication. OT will return as time allows and pt is appropriate.   [name] 02/29/2020, 1520 PM

## 2020-03-01 ENCOUNTER — Telehealth: Payer: Self-pay | Admitting: Neurology

## 2020-03-01 ENCOUNTER — Inpatient Hospital Stay (HOSPITAL_COMMUNITY): Payer: Medicare Other

## 2020-03-01 LAB — GLUCOSE, CAPILLARY
Glucose-Capillary: 189 mg/dL — ABNORMAL HIGH (ref 70–99)
Glucose-Capillary: 195 mg/dL — ABNORMAL HIGH (ref 70–99)
Glucose-Capillary: 252 mg/dL — ABNORMAL HIGH (ref 70–99)
Glucose-Capillary: 335 mg/dL — ABNORMAL HIGH (ref 70–99)

## 2020-03-01 LAB — BASIC METABOLIC PANEL
Anion gap: 8 (ref 5–15)
BUN: 18 mg/dL (ref 8–23)
CO2: 25 mmol/L (ref 22–32)
Calcium: 8.9 mg/dL (ref 8.9–10.3)
Chloride: 102 mmol/L (ref 98–111)
Creatinine, Ser: 0.82 mg/dL (ref 0.44–1.00)
GFR calc Af Amer: >60 mL/min (ref >60)
GFR calc non Af Amer: >60 mL/min (ref >60)
Glucose, Bld: 217 mg/dL — ABNORMAL HIGH (ref 70–99)
Potassium: 3.9 mmol/L (ref 3.5–5.1)
Sodium: 135 mmol/L (ref 135–145)

## 2020-03-01 LAB — HEPATITIS PANEL, ACUTE
HCV Ab: NONREACTIVE
Hep A IgM: NONREACTIVE
Hep B C IgM: NONREACTIVE
Hepatitis B Surface Ag: NONREACTIVE

## 2020-03-01 LAB — HEMOGLOBIN AND HEMATOCRIT, BLOOD
HCT: 33.7 % — ABNORMAL LOW (ref 36.0–46.0)
Hemoglobin: 10.6 g/dL — ABNORMAL LOW (ref 12.0–15.0)

## 2020-03-01 MED ORDER — BISACODYL 10 MG RE SUPP: 10.0000 mg | Freq: Every day | RECTAL | Status: DC | PRN

## 2020-03-01 NOTE — Plan of Care (Signed)
  Problem: Education: Goal: Knowledge of General Education information will improve Description Including pain rating scale, medication(s)/side effects and non-pharmacologic comfort measures Outcome: Progressing   Problem: Health Behavior/Discharge Planning: Goal: Ability to manage health-related needs will improve Outcome: Progressing   

## 2020-03-01 NOTE — Telephone Encounter (Signed)
Received a call from Sarajane Marek with [employer] for the patient. They were planning on doing the patient's IVIG at home in which the patient had initially refused. The patient was recently admitted into the hospital and has been receiving the IVIG treatment there and the husband has said she has had benefit with it.  The questions that she has is  Are we going to continue IVIG once she is dc home or do they need to cancel the referral.   If continuing then a script it needed for maintenance dose for them to have on file so they can begin that.   **She wanted me to add that they have gotten approval for the medication and it will have a copay of 2500$. We are to advise the patient that she can ask them for financial assistance in which case they can get that set up for her and help her get the medication for a reduced cost or possibly free.   We can fax over maintenance orders to [facility] to [phone]

## 2020-03-01 NOTE — Telephone Encounter (Signed)
Yes, we should continue the IVIG every 4 weeks when she gets home and follow up with me.

## 2020-03-01 NOTE — Progress Notes (Signed)
PROGRESS NOTE    Monica LeepStacia L [name]  ZOX:[id number] DOB: 07/20/1951 DOA: 02/23/2020 PCP: Gwenlyn FudgeJoyce, Britney F, FNP   Brief Narrative:  HPI On 02/23/2020 by Dr. Doran Durandhing Tu  Monica L Rowlesis a 69 y.o.femalewith medical history significant forchronic migraine, type 2 diabetes, chronic pain on methadone, PTSD who presents with concerns of worsening lower leg pain.  Patient has been having bilateral lower leg pain for the past 8 months andwas referred toneurologyby her PMR physician. Shehas had extensive work-up including an MRI of the lumbar spine, EMG and an LP. EMG noted to have rightL3 radiculopathy and right L5 radiculopathy. LP noted to have few cells but significantly elevated CSF protein consistent with inflammatory polyradiculoneuropathy and IVIG was recommended. She was recently given a week of steroids at the beginning of July.  For the past 2 to 3 weeksher pain has acutely worsened andshe has not been able to walk andnowhas been wheelchair-bound. She describes the pain as being sharp, electric-like withnumbness and tingling of her bilateral feet. It is worse when she sits down. Her left leg is usually better than her right but recently it also started to have worsening symptoms. She also feels as if her waistline is actually up at her lower ribs. Also has been having decreased bowel movement since symptoms started 8 months ago and has only been able to have 1 bowel movement per month. Her last bowel movement was 2 days ago after taking Dulcolax. Denies any issues with urine output. Denies any abdominal pain. No nausea or vomiting.  She was sent to ED by outpatient neurology for IVIG due to progressively worsening symptoms.  Interim History  Neurology evaluating and recommending several laboratory studies and recommended L-spine MRI with contrast however she does not want to have this done.  Neurology is now starting the patient on IVIG daily empirically of 2 g/kg divided  over 5 doses  02/25/20: Neurology recommending a possible nerve muscle biopsy and recommending continuing 5 days of IVIG and Dr. Wilford CornerArora recommends to contact Neurosurgery to schedule Bx. We will titrate up her morphine however will need to be judicious given that she is on chronic pain patient and already on methadone.  PT and OT unable to work with the patient again today given her pain.  Neurosurgery was consulted and they are planning for sural nerve biopsy on Monday and they have made the patient n.p.o. after midnight Sunday in preparation.  02/26/2020: Continues to complain of some morning pain and states that whenever she gets throughout the day she improves.  Has not had a bowel movement in several days now.  Understand that she will be going for biopsy on Monday for the right sural nerve  02/27/20: Continues to complain of some pain and ready to get her biopsy over with.  Still not had a bowel movement but states that this happens to her frequently and that she can go a week without having a bowel movement usually.  Labs are relatively stable and her acidosis is improved.  02/28/20: Patient still complaining of some right leg pain.  She is going for her biopsy later this afternoon.  She will complete her fifth dose of IVIG today.  Also states that she started not had a bowel movement yet but does not want to try a suppository just yet.  02/29/20: Patient is status post biopsy and feels that she has more movement in her leg and feels a little bit better today however pain control is still an issue.  We will attempt to wean her off of the IV morphine she has been getting and have cut back on the frequency.  The OT still recommending skilled nursing facility but patient opts to go home.  Will need to ensure that she had adequate pain control prior to safe discharge disposition given that she is a chronic pain patient.  Her biopsy results of the right sural nerve biopsy in the right superficial  quadriceps muscle biopsy are still pending considering starting her on steroids however have held off given her improvement in her leg and because she is an uncontrolled diabetic.  She is status post 5 days of IVIG and neurology recommends to continue PT OT and pain management and have her follow-up outpatient with neurology as well as follow-up the results of the more muscle biopsy and thiamine level.  LFTs were also elevated as well as a slight AKI and she did have a hyponatremia so she was started back on IV fluids. Anticipating discharging the next 24 to 48 hours if her pain is able to be controlled.   Assessment & Plan   Inflammatory polyradiculoneuropathy -Differential diagnosis include idiopathic lumbosacral radicular plexus neuropathy/diabetic amyotrophy per neurology; feel it is less likely AIDP more likely to be idiopathic lumbosacral radicular plexus neuropathy but then they also feel that other differentials or to consider nondiabetes related sensory or motor neuropathies -Recent lumbar CT spine showed "Mild curvature convex to the left. 2 mm chronic anterolisthesis L4-5. Previous posterior decompression and discectomy at L4-5. No compressive stenosis. Bilateral facet osteoarthritis. L5-S1 facet osteoarthritis with 2 mm of anterolisthesis. No compressive stenosis. Mild non-compressive disc bulges at L2-3 and L3-4. Question if the nerve roots of the cauda equina are minimally larger than normal, some measuring up to 1-1.2 mm in diameter, raising the possibility of some neuropathic process. I do not think this potential finding is definitely valid." -MRI Pelvis on 02/18/20 showed "No acute findings or explanation for the patient's symptoms.  Mild degenerative and postsurgical changes within the lumbar spine associated with a convex left scoliosis. No evidence of sacral nerve root encroachment. Mild muscular asymmetries as described." -Neurology consulted and appreciated, recommended getting an MRI  of the L-spine with contrast however she declined repeat MRI it would only get it done under general anesthesia  -Folate, copper, vitamin B6/ B1/ B12, angiotensin-converting enzyme, WNL -SSA SSB antibodie was less than 0.2, Antinuclear antibodies were positive IFA and she had a homogeneous pattern 1:160, ANCA negative -Heavy metal screen was negative and had 5 of arsenic, less than 1 of lead, and less than 1.0 mercury -Hemoglobin A1c was 8.6 -Neurology decided to give patient 5 treatments of IVIG -Obtain PT/OT and they initially recommended home health; they are now recommending SNF versus home health PT and OT -Continue pain control with bowel regimen for mild constipation  -Neurosurgery consulted and appreciated for sural nerve biopsy as well as right quadricep superficial biopsy, pending results -Continue supportive care with normal saline at 75 MLS per hour as it is resumed given her renal insufficiency and mild hyponatremia  Chronic pain -Continue methadone 20 mg p.o. 3 times daily, nortriptyline 25 mg p.o. nightly, prn tizanidine dose p.o. every 6 hours as needed for muscle spasm -Continue judicious use of her pain medications and she follows with Dr. Whitney Post for pain -She is currently on IV morphine for breakthrough and getting 1 mg every 3 hours as needed we will titrate up to 1 mg every 2 hours as needed and will  wean back to 1 mg every 3 hours as needed given that we will need to try and get her off of the IV morphine for discharge -PT/OT still recommending SNF, however patient would like to go home once her pain is managed   Diabetes mellitus, type II, uncontrolled with hyperglycemia  -Patient did present with hyperglycemia with elevated anion gap and metabolic acidosis which are now improving -Hemoglobin A1c 8.6 -Continue Levemir, insulin sliding scale and CBG monitoring -Metformin and Dapagliflozin currently held -Diabetes coordinator consulted  Chronic migraine -continue  Topamax and tapentadol  Hyperlipidemia -Given elevated LFTs, atorvastatin was held -Will continue to monitor LFTs and once back to normal, will restart statin  Normocytic Anemia -Hemoglobin appears to be stable -Anemia panel shows adequate iron and storage -Continue to monitor CBC  Constipation -Likely opiate induced -Continue bowel regimen -May try enemas if patient will allow  Metabolic Acidosis -Resolved  Malnutrition of moderate degree/nonsevere malnutrition -in the context of chronic illness -Dietitian was consulted for further evaluation recommendations and we remove the heart healthy diet restriction and she has been started on Ensure Enlive p.o. 3 times daily and she is also provided with a "high-calorie, high-protein nutrition therapy" handout  Abnormal LFTs  -Mildly elevated, was not rechecked today -Continue to monitor LFTs -Hepatitis panel pending -RUQ [place]: no acute abnormality. Patchy, geographic areas of increased hepatic parenchymal echogenicity suggestive of underlying liver disease and/or steatosis  Hyponatremia -Resolved, currently up to 135 -Continue IV fluids monitor BMP  Renal Insufficiency -Creatinine had increased to 1.08, back down to 0.82  -10 you to monitor BMP  DVT Prophylaxis  lovenox  Code Status: Full  Family Communication: None at bedside  Disposition Plan:  Status is: Inpatient  Remains inpatient appropriate because:Ongoing active pain requiring inpatient pain management   Dispo: The patient is from: Home              Anticipated d/c is to: TBD              Anticipated d/c date is: 2 days              Patient currently is not medically stable to d/c.   Consultants Neurology Neurosurgery  Procedures  RUQ ultrasound Right sural nerve biopsy, right superficial quadriceps muscle biopsy  Antibiotics   Anti-infectives (From admission, onward)   Start     Dose/Rate Route Frequency Ordered Stop   02/29/20 0600   ceFAZolin (ANCEF) IVPB 2g/100 mL premix        2 g 200 mL/hr over 30 Minutes Intravenous On call to O.R. 02/28/20 1457 02/28/20 1607   02/28/20 1830  ceFAZolin (ANCEF) IVPB 1 g/50 mL premix        1 g 100 mL/hr over 30 Minutes Intravenous Every 8 hours 02/28/20 1819 02/29/20 0628   02/28/20 1636  bacitracin 50,000 Units in sodium chloride 0.9 % 500 mL irrigation  Status:  Discontinued          As needed 02/28/20 1636 02/28/20 1719      Subjective:   Eulia Maker seen and examined today.  Continues to complain of lower back pain going to her right leg and left leg.  Cannot quantify or tell me more about the pain.  Also complains of constipation.  Denies current chest pain or shortness of breath, nausea or vomiting.  Objective:   Vitals:   02/29/20 2340 03/01/20 0416 03/01/20 0752 03/01/20 1155  BP: 139/76 (!) 142/73 128/76 (!) 91/55  Pulse: 81 82 89 (!)  56  Resp: 16 16 16 16   Temp: 97.9 F (36.6 C) 98.1 F (36.7 C) 98.7 F (37.1 C) 98.4 F (36.9 C)  TempSrc: Oral Oral Oral Oral  SpO2: 97% 97% 96% 98%  Weight:      Height:        Intake/Output Summary (Last 24 hours) at 03/01/2020 1345 Last data filed at 03/01/2020 0900 Gross per 24 hour  Intake 1380.03 ml  Output --  Net 1380.03 ml   Filed Weights   02/23/20 0915 02/24/20 2021  Weight: 61.7 kg 60 kg    Exam  General: Well developed, well nourished, NAD, appears stated age  HEENT: NCAT, wearing sunglasses, mucous membranes moist.   Cardiovascular: S1 S2 auscultated, RRR  Respiratory: Clear to auscultation bilaterally  Abdomen: Soft, nontender, mildly distended, + bowel sounds  Extremities: warm dry without cyanosis clubbing or edema  Neuro: AAOx3, nonfocal, or sensation noted in the right foot, able to move her extremities  Psych: appropriate   Data Reviewed: I have personally reviewed following labs and imaging studies  CBC: Recent Labs  Lab 02/25/20 0416 02/25/20 0416 02/26/20 0741 02/27/20 0512  02/28/20 1010 02/29/20 0335 03/01/20 0631  WBC 6.8  --  3.5* 4.0 5.5 7.2  --   NEUTROABS 5.3  --  2.1 2.4 3.7 5.9  --   HGB 10.4*   < > 10.8* 10.4* 10.7* 9.7* 10.6*  HCT 34.2*   < > 34.8* 32.9* 33.6* 30.1* 33.7*  MCV 94.7  --  93.8 94.3 93.1 94.1  --   PLT 127*  --  139* 125* 143* 151  --    < > = values in this interval not displayed.   Basic Metabolic Panel: Recent Labs  Lab 02/25/20 0416 02/25/20 0416 02/26/20 0741 02/27/20 0512 02/28/20 1010 02/29/20 0335 03/01/20 0631  NA 136   < > 135 135 136 131* 135  K 3.6   < > 3.6 3.9 4.2 4.4 3.9  CL 106   < > 105 104 104 102 102  CO2 22   < > 21* 24 25 22 25   GLUCOSE 157*   < > 170* 216* 159* 338* 217*  BUN 17   < > 15 13 16 17 18   CREATININE 0.81   < > 0.84 0.80 0.86 1.08* 0.82  CALCIUM 8.2*   < > 8.5* 8.6* 9.4 8.5* 8.9  MG 1.9  --  1.9 1.9 2.0 1.8  --   PHOS 3.6  --  3.3 3.5 4.0 3.6  --    < > = values in this interval not displayed.   GFR: Estimated Creatinine Clearance: 57.5 mL/min (by C-G formula based on SCr of 0.82 mg/dL). Liver Function Tests: Recent Labs  Lab 02/25/20 0416 02/26/20 0741 02/27/20 0512 02/28/20 1010 02/29/20 0335  AST 18 15 20 23  79*  ALT 17 17 17 20  65*  ALKPHOS 76 100 95 117 115  BILITOT 0.6 0.3 <0.1* 0.6 0.5  PROT 6.2* 6.9 7.1 7.9 7.6  ALBUMIN 2.6* 2.7* 2.6* 2.8* 2.5*   No results for input(s): LIPASE, AMYLASE in the last 168 hours. No results for input(s): AMMONIA in the last 168 hours. Coagulation Profile: No results for input(s): INR, PROTIME in the last 168 hours. Cardiac Enzymes: No results for input(s): CKTOTAL, CKMB, CKMBINDEX, TROPONINI in the last 168 hours. BNP (last 3 results) No results for input(s): PROBNP in the last 8760 hours. HbA1C: No results for input(s): HGBA1C in the last 72 hours. CBG: Recent  Labs  Lab 02/29/20 1108 02/29/20 1817 02/29/20 2125 03/01/20 0631 03/01/20 1154  GLUCAP 301* 315* 274* 189* 195*   Lipid Profile: No results for input(s): CHOL,  HDL, LDLCALC, TRIG, CHOLHDL, LDLDIRECT in the last 72 hours. Thyroid Function Tests: No results for input(s): TSH, T4TOTAL, FREET4, T3FREE, THYROIDAB in the last 72 hours. Anemia Panel: No results for input(s): VITAMINB12, FOLATE, FERRITIN, TIBC, IRON, RETICCTPCT in the last 72 hours. Urine analysis:    Component Value Date/Time   COLORURINE YELLOW 12/30/2019 1335   APPEARANCEUR HAZY (A) 12/30/2019 1335   LABSPEC 1.015 12/30/2019 1335   PHURINE 5.0 12/30/2019 1335   GLUCOSEU 50 (A) 12/30/2019 1335   HGBUR LARGE (A) 12/30/2019 1335   BILIRUBINUR NEGATIVE 12/30/2019 1335   KETONESUR NEGATIVE 12/30/2019 1335   PROTEINUR 100 (A) 12/30/2019 1335   UROBILINOGEN 0.2 05/04/2012 1109   NITRITE NEGATIVE 12/30/2019 1335   LEUKOCYTESUR SMALL (A) 12/30/2019 1335   Sepsis Labs: (procalcitonin:4,lacticidven:4)  ) Recent Results (from the past 240 hour(s))  SARS Coronavirus 2 by RT PCR (hospital order, performed in [facility] hospital lab) Nasopharyngeal Nasopharyngeal Swab     Status: None   Collection Time: 02/23/20  8:07 PM   Specimen: Nasopharyngeal Swab  Result Value Ref Range Status   SARS Coronavirus 2 NEGATIVE NEGATIVE Final    Comment: (NOTE) SARS-CoV-2 target nucleic acids are NOT DETECTED.  The SARS-CoV-2 RNA is generally detectable in upper and lower respiratory specimens during the acute phase of infection. The lowest concentration of SARS-CoV-2 viral copies this assay can detect is 250 copies / mL. A negative result does not preclude SARS-CoV-2 infection and should not be used as the sole basis for treatment or other patient management decisions.  A negative result may occur with improper specimen collection / handling, submission of specimen other than nasopharyngeal swab, presence of viral mutation(s) within the areas targeted by this assay, and inadequate number of viral copies (<250 copies / mL). A negative result must be combined with clinical observations,  patient history, and epidemiological information.  Fact Sheet for Patients:   BoilerBrush.com.cy  Fact Sheet for Healthcare Providers: https://pope.com/  This test is not yet approved or  cleared by the [place] FDA and has been authorized for detection and/or diagnosis of SARS-CoV-2 by FDA under an Emergency Use Authorization (EUA).  This EUA will remain in effect (meaning this test can be used) for the duration of the COVID-19 declaration under Section 564(b)(1) of the Act, 21 U.S.C. section 360bbb-3(b)(1), unless the authorization is terminated or revoked sooner.  Performed at [facility], [address] [place], [place] [address]   Surgical PCR screen     Status: None   Collection Time: 02/28/20 12:25 AM   Specimen: Nasal Mucosa; Nasal Swab  Result Value Ref Range Status   MRSA, PCR NEGATIVE NEGATIVE Final   Staphylococcus aureus NEGATIVE NEGATIVE Final    Comment: (NOTE) The Xpert SA Assay (FDA approved for NASAL specimens in patients 82 years of age and older), is one component of a comprehensive surveillance program. It is not intended to diagnose infection nor to guide or monitor treatment. Performed at [facility], [address], [place], [place] [address]       Radiology Studies: US Abdomen Limited RUQ  Result Date: 03/01/2020 CLINICAL DATA:  Abnormal LFTs. EXAM: ULTRASOUND ABDOMEN LIMITED RIGHT UPPER QUADRANT COMPARISON:  CT abdomen pelvis dated Dec 30, 2019. FINDINGS: Gallbladder: Surgically absent. Common bile duct: Diameter: 5 mm, normal. Liver:  No focal lesion identified. Patchy, geographic areas of increased parenchymal echogenicity. Portal vein is patent on color Doppler imaging with normal direction of blood flow towards the liver. Other: None. IMPRESSION: 1. No acute abnormality. 2. Patchy, geographic areas of increased hepatic parenchymal echogenicity, suggestive of underlying liver disease  and/or steatosis. Electronically Signed   By: Obie Dredge M.D.   On: 03/01/2020 07:43     Scheduled Meds: . enoxaparin (LOVENOX) injection  40 mg Subcutaneous Q24H  . feeding supplement (ENSURE ENLIVE)  237 mL Oral TID BM  . insulin aspart  0-15 Units Subcutaneous TID AC & HS  . insulin aspart  5 Units Subcutaneous TID WC  . insulin detemir  5 Units Subcutaneous BID  . magnesium oxide  400 mg Oral BID  . methadone  20 mg Oral TID  . nortriptyline  25 mg Oral QHS  . pneumococcal 23 valent vaccine  0.5 mL Intramuscular Tomorrow-1000  . polyethylene glycol  17 g Oral BID  . senna  1 tablet Oral BID  . sodium chloride flush  3 mL Intravenous Q12H  . topiramate  100 mg Oral Daily  . topiramate  200 mg Oral QHS   Continuous Infusions: . sodium chloride 1 mL/hr at 02/28/20 1846  . 0.9 % NaCl with KCl 20 mEq / L 75 mL/hr at 02/28/20 1853     LOS: 7 days   Time Spent in minutes   45 minutes  Blaine Guiffre D.O. on 03/01/2020 at 1:45 PM  Between 7am to 7pm - Please see pager noted on [email or web address]  After 7pm go to [email or web address]  And look for the night coverage person covering for me after hours  [facility] Office  [phone]

## 2020-03-01 NOTE — Progress Notes (Addendum)
Physical Therapy Treatment Patient Details Name: Monica [name] MRN: [id number] DOB: 1951-08-04 Today's Date: 03/01/2020    History of Present Illness Monica [name] is a 69 y.o. female s/p concerns of worsening lower leg pain and inability to walk. 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  She is now s/p R sural nerve and R superficial quadriceps muscle biopsies on 02/28/20. Pt with medical history significant for chronic migraine, type 2 diabetes, chronic pain on methadone, PTSD    PT Comments    Pt demonstrating improvements in tolerance for therapy today.  She was able to complete multiple transfers and ambulated 15' with RW.  Required min-mod A, cues for transfer techniques, and increased time.  Pt did demonstrate steppage gait and no dorsiflexion strength on L side which spouse reports is new (pt states has been gradually coming on).  Did change recommendation to CIR as pt demonstrating improved ability to participate and would benefit from further intense therapy.  Reports that 7-8 months ago she was completely independent (community ambulation, driving, IADLs) and since that time has had gradual decline.  Reports 1 month ago, she could ambulate in home with RW but no assistance.  Pt does have necessary DME and good family support, but would benefit from further therapy to achieve maximal potential.  Educated pt/spouse on PT recommendation and they will consider/discuss.   Follow Up Recommendations  CIR     Equipment Recommendations  None recommended by PT (has DME)    Recommendations for Other Services Rehab consult     Precautions / Restrictions Precautions Precautions: Fall    Mobility  Bed Mobility Overal bed mobility: Needs Assistance Bed Mobility: Supine to Sit;Sit to Supine     Supine to sit: Min assist;HOB elevated Sit to supine: Min assist;HOB elevated   General bed mobility comments: Required assist for L LE with transfers.   Required increased time and use of bed rails  Transfers Overall transfer level: Needs assistance Equipment used: Rolling walker (2 wheeled) Transfers: Sit to/from Stand Sit to Stand: Mod assist (from normal height surface)         General transfer comment: Performed sit to stand x 2 from normal height; required mod A to power up and steady when moving hands to walker  Ambulation/Gait Ambulation/Gait assistance: Min assist Gait Distance (Feet): 15 Feet (2' then 15') Assistive device: Rolling walker (2 wheeled) Gait Pattern/deviations: Step-to pattern;Decreased stride length;Steppage;Decreased dorsiflexion - right;Decreased dorsiflexion - left Gait velocity: reduced   General Gait Details: Required cues for RW adn posture; required assist with RW for turns and in tight areas.  Pt with foot drop/steppage gait bilaterally - reports this is new for L side.   Stairs             Wheelchair Mobility    Modified Rankin (Stroke Patients Only)       Balance Overall balance assessment: Needs assistance Sitting-balance support: Feet supported;No upper extremity supported Sitting balance-Leahy Scale: Fair     Standing balance support: Bilateral upper extremity supported Standing balance-Leahy Scale: Poor Standing balance comment: required RW; required mod A when moving hands to walker from [facility]                            Cognition Arousal/Alertness: Awake/alert Behavior During Therapy: WFL for tasks assessed/performed Overall Cognitive Status: Within Functional Limits for tasks assessed  Exercises General Exercises - Lower Extremity Ankle Circles/Pumps: 10 reps;AAROM;Both;Supine Quad Sets: AROM;Both;10 reps;Supine Other Exercises Other Exercises: Gait belt plantar flexor stretch on L 10 reps 10 sec hold    General Comments General comments (skin integrity, edema, etc.): Spouse in room .  Spent increased  time discussing PT recommendations and d/c options.  Pt preferring to go home to be with spouse and dogs, but does realize there are barriers.  She has 4 steps to enter home - spouse expressed concern regarding stairs.  Discussed possible need for ramp if not improved.  They have w/c, bsc, and RW.  PT discussed recommendation for rehab at discharge.  Educated on SNF vs inpatient rehab/CIR (pt familiar).  Discussed therapy will recommend CIR at this time due to pt with high PLOF 7-8 months ago and household ambulation 1 month ago and would benefit from intense rehab to improve mobility.  Spouse and pt to discuss options. Also, educated on AFOs if foot drop not improving.       Pertinent Vitals/Pain Pain Assessment: Faces Faces Pain Scale: Hurts little more Pain Location: R thigh with initial movements, but then eased Pain Descriptors / Indicators: Cramping;Spasm Pain Intervention(s): Limited activity within patient's tolerance;Monitored during session;RN gave pain meds during session    Home Living                      Prior Function            PT Goals (current goals can now be found in the care plan section) Acute Rehab PT Goals Patient Stated Goal: to return home with spouse and dogs; they will discuss rehab as option PT Goal Formulation: With patient/family Time For Goal Achievement: 03/09/20 Progress towards PT goals: Progressing toward goals    Frequency    Min 3X/week      PT Plan Discharge plan needs to be updated;Equipment recommendations need to be updated    Co-evaluation              AM-PAC PT "6 Clicks" Mobility   Outcome Measure  Help needed turning from your back to your side while in a flat bed without using bedrails?: A Little Help needed moving from lying on your back to sitting on the side of a flat bed without using bedrails?: A Little Help needed moving to and from a bed to a chair (including a wheelchair)?: A Little Help needed standing up  from a chair using your arms (e.g., wheelchair or bedside chair)?: A Little Help needed to walk in hospital room?: A Little Help needed climbing 3-5 steps with a railing? : A Lot 6 Click Score: 17    End of Session Equipment Utilized During Treatment: Gait belt Activity Tolerance: Patient tolerated treatment well Patient left: in bed;with call bell/phone within reach;with family/visitor present;with bed alarm set Nurse Communication: Mobility status PT Visit Diagnosis: Unsteadiness on feet (R26.81);Muscle weakness (generalized) (M62.81);Difficulty in walking, not elsewhere classified (R26.2)     Time: 4580-9983 PT Time Calculation (min) (ACUTE ONLY): 28 min  Charges:  $Gait Training: 8-22 mins $Therapeutic Activity: 8-22 mins                     Anise Salvo, PT Acute Rehab Services Pager [phone] [facility] [phone]     Rayetta Humphrey 03/01/2020, 5:19 PM

## 2020-03-01 NOTE — Progress Notes (Signed)
Inpatient Diabetes Program Recommendations  AACE/ADA: New Consensus Statement on Inpatient Glycemic Control (2015)  Target Ranges:  Prepandial:   less than 140 mg/dL      Peak postprandial:   less than 180 mg/dL (1-2 hours)      Critically ill patients:  140 - 180 mg/dL   Lab Results  Component Value Date   GLUCAP 189 (H) 03/01/2020   HGBA1C 8.6 (H) 02/23/2020    Review of Glycemic Control Results for LAPORCHIA, NAKAJIMA (MRN [id number]) as of 03/01/2020 09:57  Ref. Range 02/29/2020 08:19 02/29/2020 11:08 02/29/2020 18:17 02/29/2020 21:25 03/01/2020 06:31  Glucose-Capillary Latest Ref Range: 70 - 99 mg/dL 169 (H) 450 (H) 388 (H) 274 (H) 189 (H)   Diabetes history:Type 2 DM Outpatient Diabetes medications:Metformin 1000 mg BID Current orders for Inpatient glycemic control:Novolog 0-15 units TID, Novolog 6 units TID, Levemir 5 units BID Decadron 4 mg x 1  Inpatient Diabetes Program Recommendations:  Reached out to RN: patient not eating at >50% of meals consistently, thus elevated related to nutritional supplement intake and steroids on 7/19. Consider increasing Levemir to 8 units BID.    Thanks, Lujean Rave, MSN, RNC-OB Diabetes Coordinator [phone] (8a-5p)

## 2020-03-01 NOTE — Telephone Encounter (Signed)
Order placed and was faxed to the number provided to [employer].

## 2020-03-02 ENCOUNTER — Encounter (HOSPITAL_COMMUNITY): Payer: Self-pay | Admitting: Internal Medicine

## 2020-03-02 DIAGNOSIS — G619 Inflammatory polyneuropathy, unspecified: Secondary | ICD-10-CM

## 2020-03-02 LAB — GLUCOSE, CAPILLARY
Glucose-Capillary: 183 mg/dL — ABNORMAL HIGH (ref 70–99)
Glucose-Capillary: 152 mg/dL — ABNORMAL HIGH (ref 70–99)
Glucose-Capillary: 265 mg/dL — ABNORMAL HIGH (ref 70–99)
Glucose-Capillary: 229 mg/dL — ABNORMAL HIGH (ref 70–99)
Glucose-Capillary: 240 mg/dL — ABNORMAL HIGH (ref 70–99)

## 2020-03-02 LAB — COMPREHENSIVE METABOLIC PANEL
ALT: 30 U/L (ref 0–44)
AST: 22 U/L (ref 15–41)
Albumin: 2.6 g/dL — ABNORMAL LOW (ref 3.5–5.0)
Alkaline Phosphatase: 110 U/L (ref 38–126)
Anion gap: 8 (ref 5–15)
BUN: 20 mg/dL (ref 8–23)
CO2: 25 mmol/L (ref 22–32)
Calcium: 9.2 mg/dL (ref 8.9–10.3)
Chloride: 101 mmol/L (ref 98–111)
Creatinine, Ser: 0.96 mg/dL (ref 0.44–1.00)
GFR calc Af Amer: >60 mL/min (ref >60)
GFR calc non Af Amer: >60 mL/min (ref >60)
Glucose, Bld: 208 mg/dL — ABNORMAL HIGH (ref 70–99)
Potassium: 4.4 mmol/L (ref 3.5–5.1)
Sodium: 134 mmol/L — ABNORMAL LOW (ref 135–145)
Total Bilirubin: 0.6 mg/dL (ref 0.3–1.2)
Total Protein: 7.4 g/dL (ref 6.5–8.1)

## 2020-03-02 LAB — HEMOGLOBIN AND HEMATOCRIT, BLOOD
HCT: 33.2 % — ABNORMAL LOW (ref 36.0–46.0)
Hemoglobin: 10.3 g/dL — ABNORMAL LOW (ref 12.0–15.0)

## 2020-03-02 MED ORDER — INSULIN ASPART 100 UNIT/ML ~~LOC~~ SOLN
0.0000 [IU] | Freq: Three times a day (TID) | SUBCUTANEOUS | Status: DC
  Administered 2020-03-02: 8 [IU] via SUBCUTANEOUS
  Administered 2020-03-02 (×2): 5 [IU] via SUBCUTANEOUS
  Administered 2020-03-03: 3 [IU] via SUBCUTANEOUS
  Administered 2020-03-03: 8 [IU] via SUBCUTANEOUS

## 2020-03-02 MED ORDER — ATORVASTATIN CALCIUM 10 MG PO TABS
10.0000 mg | ORAL_TABLET | Freq: Every day | ORAL | Status: DC
  Administered 2020-03-02 – 2020-03-03 (×2): 10 mg via ORAL
  Filled 2020-03-02 (×2): qty 1

## 2020-03-02 NOTE — Progress Notes (Signed)
Pt has not had BM since 7/13. Fluid intake has been encouraged this shift. Pt is refusing enema, suppository, and miralax. Says she is not ready for suppository/enema and that miralax does not work for her. Reinforced importance of routine BM, will continue to educate and encourage fluid intake as well as suppository/enema.

## 2020-03-02 NOTE — Progress Notes (Signed)
Inpatient Rehabilitation Admissions Coordinator  I met with patient at bedside for rehab assessment. She requests that I return upon arrival of her spouse and he is verified to be at bedside at 12 noon. I will return at that time.  Danne Baxter, RN, MSN Rehab Admissions Coordinator [phone] 03/02/2020 11:11 AM

## 2020-03-02 NOTE — Progress Notes (Signed)
Occupational Therapy Treatment Patient Details Name: Monica [name] MRN: [id number] DOB: 01-02-1951 Today's Date: 03/02/2020    History of present illness Monica [name] is a 69 y.o. female s/p concerns of worsening lower leg pain and inability to walk. 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  She is now s/p R sural nerve and R superficial quadriceps muscle biopsies on 02/28/20. Pt with medical history significant for chronic migraine, type 2 diabetes, chronic pain on methadone, PTSD   OT comments  This 69 yo female seen today for Bil UE strengthening exercises using level 1 rep band. She was able to do these independently and show/verbalize to me the 3 exercises she to continue to do. We will continue to follow.  Follow Up Recommendations  CIR;Supervision/Assistance - 24 hour    Equipment Recommendations  3 in 1 bedside commode       Precautions / Restrictions Precautions Precautions: Fall                     Exercises Other Exercises Other Exercises: Pt seen for Bil UE exercises with level 1 theraband. Pt able to do 10 reps of R and LUE for horizontal ab/adduction and arm extension (pushing theraband down towards toes). Pt encouraged to do these 3 exercises 5x/day 10 reps each.        Frequency  Min 2X/week        Progress Toward Goals  OT Goals(current goals can now be found in the care plan section)  Progress towards OT goals: Progressing toward goals  Acute Rehab OT Goals Patient Stated Goal: to go to rehab and then to return home with spouse and dogs OT Goal Formulation: With patient Time For Goal Achievement: 03/12/20 Potential to Achieve Goals: Good  Plan Discharge plan needs to be updated       AM-PAC OT "6 Clicks" Daily Activity     Outcome Measure   Help from another person eating meals?: None Help from another person taking care of personal grooming?: A Little Help from another person toileting, which  includes using toliet, bedpan, or urinal?: A Lot Help from another person bathing (including washing, rinsing, drying)?: A Lot Help from another person to put on and taking off regular upper body clothing?: A Little Help from another person to put on and taking off regular lower body clothing?: A Lot 6 Click Score: 16    End of Session    OT Visit Diagnosis: Unsteadiness on feet (R26.81);Muscle weakness (generalized) (M62.81)   Activity Tolerance Patient tolerated treatment well   Patient Left in bed;with call bell/phone within reach;with family/visitor present   Nurse Communication  (pt asking about muscle relaxer)        Time: 1542-1600 OT Time Calculation (min): 18 min  Charges: OT General Charges $OT Visit: 1 Visit OT Treatments $Therapeutic Exercise: 8-22 mins  Ignacia Palma, OTR/L Acute [employer] Pager [phone] Office [phone]      Evette Georges 03/02/2020, 4:42 PM

## 2020-03-02 NOTE — Progress Notes (Signed)
PROGRESS NOTE    Monica LeepStacia L Freimark  ZOX:[id number] DOB: 07/20/1951 DOA: 02/23/2020 PCP: Gwenlyn FudgeJoyce, Britney F, FNP   Brief Narrative:  HPI On 02/23/2020 by Dr. Doran Durandhing Tu  Monica L Rowlesis a 69 y.o.femalewith medical history significant forchronic migraine, type 2 diabetes, chronic pain on methadone, PTSD who presents with concerns of worsening lower leg pain.  Patient has been having bilateral lower leg pain for the past 8 months andwas referred toneurologyby her PMR physician. Shehas had extensive work-up including an MRI of the lumbar spine, EMG and an LP. EMG noted to have rightL3 radiculopathy and right L5 radiculopathy. LP noted to have few cells but significantly elevated CSF protein consistent with inflammatory polyradiculoneuropathy and IVIG was recommended. She was recently given a week of steroids at the beginning of July.  For the past 2 to 3 weeksher pain has acutely worsened andshe has not been able to walk andnowhas been wheelchair-bound. She describes the pain as being sharp, electric-like withnumbness and tingling of her bilateral feet. It is worse when she sits down. Her left leg is usually better than her right but recently it also started to have worsening symptoms. She also feels as if her waistline is actually up at her lower ribs. Also has been having decreased bowel movement since symptoms started 8 months ago and has only been able to have 1 bowel movement per month. Her last bowel movement was 2 days ago after taking Dulcolax. Denies any issues with urine output. Denies any abdominal pain. No nausea or vomiting.  She was sent to ED by outpatient neurology for IVIG due to progressively worsening symptoms.  Interim History  Neurology evaluating and recommending several laboratory studies and recommended L-spine MRI with contrast however she does not want to have this done.  Neurology is now starting the patient on IVIG daily empirically of 2 g/kg divided  over 5 doses  02/25/20: Neurology recommending a possible nerve muscle biopsy and recommending continuing 5 days of IVIG and Dr. Wilford CornerArora recommends to contact Neurosurgery to schedule Bx. We will titrate up her morphine however will need to be judicious given that she is on chronic pain patient and already on methadone.  PT and OT unable to work with the patient again today given her pain.  Neurosurgery was consulted and they are planning for sural nerve biopsy on Monday and they have made the patient n.p.o. after midnight Sunday in preparation.  02/26/2020: Continues to complain of some morning pain and states that whenever she gets throughout the day she improves.  Has not had a bowel movement in several days now.  Understand that she will be going for biopsy on Monday for the right sural nerve  02/27/20: Continues to complain of some pain and ready to get her biopsy over with.  Still not had a bowel movement but states that this happens to her frequently and that she can go a week without having a bowel movement usually.  Labs are relatively stable and her acidosis is improved.  02/28/20: Patient still complaining of some right leg pain.  She is going for her biopsy later this afternoon.  She will complete her fifth dose of IVIG today.  Also states that she started not had a bowel movement yet but does not want to try a suppository just yet.  02/29/20: Patient is status post biopsy and feels that she has more movement in her leg and feels a little bit better today however pain control is still an issue.  We will attempt to wean her off of the IV morphine she has been getting and have cut back on the frequency.  The OT still recommending skilled nursing facility but patient opts to go home.  Will need to ensure that she had adequate pain control prior to safe discharge disposition given that she is a chronic pain patient.  Her biopsy results of the right sural nerve biopsy in the right superficial  quadriceps muscle biopsy are still pending considering starting her on steroids however have held off given her improvement in her leg and because she is an uncontrolled diabetic.  She is status post 5 days of IVIG and neurology recommends to continue PT OT and pain management and have her follow-up outpatient with neurology as well as follow-up the results of the more muscle biopsy and thiamine level.  LFTs were also elevated as well as a slight AKI and she did have a hyponatremia so she was started back on IV fluids. Anticipating discharging the next 24 to 48 hours if her pain is able to be controlled.   Assessment & Plan   Inflammatory polyradiculoneuropathy -Differential diagnosis include idiopathic lumbosacral radicular plexus neuropathy/diabetic amyotrophy per neurology; feel it is less likely AIDP more likely to be idiopathic lumbosacral radicular plexus neuropathy but then they also feel that other differentials or to consider nondiabetes related sensory or motor neuropathies -Recent lumbar CT spine showed "Mild curvature convex to the left. 2 mm chronic anterolisthesis L4-5. Previous posterior decompression and discectomy at L4-5. No compressive stenosis. Bilateral facet osteoarthritis. L5-S1 facet osteoarthritis with 2 mm of anterolisthesis. No compressive stenosis. Mild non-compressive disc bulges at L2-3 and L3-4. Question if the nerve roots of the cauda equina are minimally larger than normal, some measuring up to 1-1.2 mm in diameter, raising the possibility of some neuropathic process. I do not think this potential finding is definitely valid." -MRI Pelvis on 02/18/20 showed "No acute findings or explanation for the patient's symptoms.  Mild degenerative and postsurgical changes within the lumbar spine associated with a convex left scoliosis. No evidence of sacral nerve root encroachment. Mild muscular asymmetries as described." -Neurology consulted and appreciated, recommended getting an MRI  of the L-spine with contrast however she declined repeat MRI it would only get it done under general anesthesia  -Folate, copper, vitamin B6/ B1/ B12, angiotensin-converting enzyme, WNL -SSA SSB antibodie was less than 0.2, Antinuclear antibodies were positive IFA and she had a homogeneous pattern 1:160, ANCA negative -Heavy metal screen was negative and had 5 of arsenic, less than 1 of lead, and less than 1.0 mercury -Hemoglobin A1c was 8.6 -Neurology decided to give patient 5 treatments of IVIG -Continue pain control with bowel regimen for mild constipation  -Neurosurgery consulted and appreciated for sural nerve biopsy as well as right quadricep superficial biopsy, pending results -Continue supportive care with normal saline at 75 MLS per hour as it is resumed given her renal insufficiency and mild hyponatremia -PT and OT recommending CIR now, CIR consulted -Patient [facility] but is open to CIR consult and information   Chronic pain -Continue methadone 20 mg p.o. 3 times daily, nortriptyline 25 mg p.o. nightly, prn tizanidine dose p.o. every 6 hours as needed for muscle spasm -Continue judicious use of her pain medications and she follows with Dr. Whitney PostSchwartz [place] for pain -She is currently on IV morphine for breakthrough and getting 1 mg every 3 hours as needed we will titrate up to  1 mg every 2 hours as needed and will wean back to 1 mg every 3 hours as needed given that we will need to try and get her off of the IV morphine for discharge -PT/OT still recommending SNF, however patient would like to go home once her pain is managed   Diabetes mellitus, type II, uncontrolled with hyperglycemia  -Patient did present with hyperglycemia with elevated anion gap and metabolic acidosis which are now improving -Hemoglobin A1c 8.6 -Continue Levemir, insulin sliding scale and CBG monitoring -Metformin and Dapagliflozin currently held -Diabetes coordinator  consulted  Chronic migraine -continue Topamax and tapentadol  Hyperlipidemia -Given elevated LFTs, atorvastatin was held -will restart statin today given that LFTS are within normal range now  Normocytic Anemia -Hemoglobin appears to be stable, currently 10.3 -Anemia panel shows adequate iron and storage -Continue to monitor CBC  Constipation -Likely opiate induced -Continue bowel regimen -Have ordered enema and suppository- however patient refusing -have had extensive conversation regarding constipation and how this could also be affecting her back pain  Metabolic Acidosis -Resolved  Malnutrition of moderate degree/nonsevere malnutrition -in the context of chronic illness -Dietitian was consulted for further evaluation recommendations and we remove the heart healthy diet restriction and she has been started on Ensure Enlive p.o. 3 times daily and she is also provided with a "high-calorie, high-protein nutrition therapy" handout  Abnormal LFTs  -Resolved -Mildly elevated -Continue to monitor LFTs -Hepatitis panel nonreactive -RUQ [place]: no acute abnormality. Patchy, geographic areas of increased hepatic parenchymal echogenicity suggestive of underlying liver disease and/or steatosis  Hyponatremia -mild, up to 134 today -continue to monitor BMP  Renal Insufficiency -Creatinine had increased to 1.08, back down to 0.96 -Continue to monitor BMP  DVT Prophylaxis  lovenox  Code Status: Full  Family Communication: None at bedside  Disposition Plan:  Status is: Inpatient  Remains inpatient appropriate because:Ongoing active pain requiring inpatient pain management   Dispo: The patient is from: Home              Anticipated d/c is to: TBD- possible CIR              Anticipated d/c date is: 2 days              Patient currently is not medically stable to d/c.   Consultants Neurology Neurosurgery  Procedures  RUQ ultrasound Right sural nerve biopsy, right  superficial quadriceps muscle biopsy  Antibiotics   Anti-infectives (From admission, onward)   Start     Dose/Rate Route Frequency Ordered Stop   02/29/20 0600  ceFAZolin (ANCEF) IVPB 2g/100 mL premix        2 g 200 mL/hr over 30 Minutes Intravenous On call to O.R. 02/28/20 1457 02/28/20 1607   02/28/20 1830  ceFAZolin (ANCEF) IVPB 1 g/50 mL premix        1 g 100 mL/hr over 30 Minutes Intravenous Every 8 hours 02/28/20 1819 02/29/20 0628   02/28/20 1636  bacitracin 50,000 Units in sodium chloride 0.9 % 500 mL irrigation  Status:  Discontinued          As needed 02/28/20 1636 02/28/20 1719      Subjective:   Kaityln Protzman seen and examined today.  Continues to have lower back pain along with leg pain.  Cannot quantify the pain.  Tells me she also has some spasms.  Denies current chest pain or shortness of breath, abdominal pain, nausea or vomiting.  Does state that she has chronic constipation and usually  goes every 5 days at home.  States she is able to move her right foot more.  Objective:   Vitals:   03/01/20 2347 03/02/20 0359 03/02/20 0735 03/02/20 1155  BP: 140/84 138/81 133/88 127/72  Pulse: 100 98 87 87  Resp: Temp: 98.7 F (37.1 C) 98.5 F (36.9 C) 98.4 F (36.9 C) 98.4 F (36.9 C)  TempSrc: Oral Oral Oral Oral  SpO2: 97% 98% 98% 98%  Weight:      Height:        Intake/Output Summary (Last 24 hours) at 03/02/2020 1355 Last data filed at 03/02/2020 0900 Gross per 24 hour  Intake 460 ml  Output 800 ml  Net -340 ml   Filed Weights   02/23/20 0915 02/24/20 2021  Weight: 61.7 kg 60 kg   Exam  General: Well developed, well nourished, NAD, appears stated age  HEENT: NCAT, mucous membranes moist.   Cardiovascular: S1 S2 auscultated, RRR  Respiratory: Clear to auscultation bilaterally  Abdomen: Soft, nontender, mildly distended, + bowel sounds  Extremities: warm dry without cyanosis clubbing or edema  Neuro: AAOx3, nonfocal, sensation intact.  Able to move lower extremities   Psych: Appropriate  Data Reviewed: I have personally reviewed following labs and imaging studies  CBC: Recent Labs  Lab 02/25/20 0416 02/25/20 0416 02/26/20 0741 02/26/20 0741 02/27/20 0512 02/28/20 1010 02/29/20 0335 03/01/20 0631 03/02/20 0455  WBC 6.8  --  3.5*  --  4.0 5.5 7.2  --   --   NEUTROABS 5.3  --  2.1  --  2.4 3.7 5.9  --   --   HGB 10.4*   < > 10.8*   < > 10.4* 10.7* 9.7* 10.6* 10.3*  HCT 34.2*   < > 34.8*   < > 32.9* 33.6* 30.1* 33.7* 33.2*  MCV 94.7  --  93.8  --  94.3 93.1 94.1  --   --   PLT 127*  --  139*  --  125* 143* 151  --   --    < > = values in this interval not displayed.   Basic Metabolic Panel: Recent Labs  Lab 02/25/20 0416 02/25/20 0416 02/26/20 0741 02/26/20 0741 02/27/20 0512 02/28/20 1010 02/29/20 0335 03/01/20 0631 03/02/20 0455  NA 136   < > 135   < > 135 136 131* 135 134*  K 3.6   < > 3.6   < > 3.9 4.2 4.4 3.9 4.4  CL 106   < > 105   < > 104 104 102 102 101  CO2 22   < > 21*   < > GLUCOSE 157*   < > 170*   < > 216* 159* 338* 217* 208*  BUN 17   < > 15   < > CREATININE 0.81   < > 0.84   < > 0.80 0.86 1.08* 0.82 0.96  CALCIUM 8.2*   < > 8.5*   < > 8.6* 9.4 8.5* 8.9 9.2  MG 1.9  --  1.9  --  1.9 2.0 1.8  --   --   PHOS 3.6  --  3.3  --  3.5 4.0 3.6  --   --    < > = values in this interval not displayed.   GFR: Estimated Creatinine Clearance: 49.1 mL/min (by C-G formula based on SCr of 0.96 mg/dL). Liver Function Tests: Recent Labs  Lab  02/26/20 0741 02/27/20 0512 02/28/20 1010 02/29/20 0335 03/02/20 0455  AST 15 20 23  79* 22  ALT 17 17 20  65* 30  ALKPHOS 100 95 117 115 110  BILITOT 0.3 <0.1* 0.6 0.5 0.6  PROT 6.9 7.1 7.9 7.6 7.4  ALBUMIN 2.7* 2.6* 2.8* 2.5* 2.6*   No results for input(s): LIPASE, AMYLASE in the last 168 hours. No results for input(s): AMMONIA in the last 168 hours. Coagulation Profile: No results for input(s): INR, PROTIME in the  last 168 hours. Cardiac Enzymes: No results for input(s): CKTOTAL, CKMB, CKMBINDEX, TROPONINI in the last 168 hours. BNP (last 3 results) No results for input(s): PROBNP in the last 8760 hours. HbA1C: No results for input(s): HGBA1C in the last 72 hours. CBG: Recent Labs  Lab 03/01/20 1658 03/01/20 2115 03/02/20 0614 03/02/20 0738 03/02/20 1301  GLUCAP 252* 335* 183* 152* 265*   Lipid Profile: No results for input(s): CHOL, HDL, LDLCALC, TRIG, CHOLHDL, LDLDIRECT in the last 72 hours. Thyroid Function Tests: No results for input(s): TSH, T4TOTAL, FREET4, T3FREE, THYROIDAB in the last 72 hours. Anemia Panel: No results for input(s): VITAMINB12, FOLATE, FERRITIN, TIBC, IRON, RETICCTPCT in the last 72 hours. Urine analysis:    Component Value Date/Time   COLORURINE YELLOW 12/30/2019 1335   APPEARANCEUR HAZY (A) 12/30/2019 1335   LABSPEC 1.015 12/30/2019 1335   PHURINE 5.0 12/30/2019 1335   GLUCOSEU 50 (A) 12/30/2019 1335   HGBUR LARGE (A) 12/30/2019 1335   BILIRUBINUR NEGATIVE 12/30/2019 1335   KETONESUR NEGATIVE 12/30/2019 1335   PROTEINUR 100 (A) 12/30/2019 1335   UROBILINOGEN 0.2 05/04/2012 1109   NITRITE NEGATIVE 12/30/2019 1335   LEUKOCYTESUR SMALL (A) 12/30/2019 1335   Sepsis Labs: @LABRCNTIP (procalcitonin:4,lacticidven:4)  ) Recent Results (from the past 240 hour(s))  SARS Coronavirus 2 by RT PCR (hospital order, performed in [facility] hospital lab) Nasopharyngeal Nasopharyngeal Swab     Status: None   Collection Time: 02/23/20  8:07 PM   Specimen: Nasopharyngeal Swab  Result Value Ref Range Status   SARS Coronavirus 2 NEGATIVE NEGATIVE Final    Comment: (NOTE) SARS-CoV-2 target nucleic acids are NOT DETECTED.  The SARS-CoV-2 RNA is generally detectable in upper and lower respiratory specimens during the acute phase of infection. The lowest concentration of SARS-CoV-2 viral copies this assay can detect is 250 copies / mL. A negative result does not preclude  SARS-CoV-2 infection and should not be used as the sole basis for treatment or other patient management decisions.  A negative result may occur with improper specimen collection / handling, submission of specimen other than nasopharyngeal swab, presence of viral mutation(s) within the areas targeted by this assay, and inadequate number of viral copies (<250 copies / mL). A negative result must be combined with clinical observations, patient history, and epidemiological information.  Fact Sheet for Patients:    Fact Sheet for Healthcare Providers: [facility]  This test is not yet approved or  cleared by the 02/25/20 FDA and has been authorized for detection and/or diagnosis of SARS-CoV-2 by FDA under an Emergency Use Authorization (EUA).  This EUA will remain in effect (meaning this test can be used) for the duration of the COVID-19 declaration under Section 564(b)(1) of the Act, 21 U.S.C. section 360bbb-3(b)(1), unless the authorization is terminated or revoked sooner.  Performed at [facility], [address], [place], [facility] [address]   Surgical PCR screen     Status: None   Collection Time: 02/28/20 12:25 AM  Specimen: Nasal Mucosa; Nasal Swab  Result Value Ref Range Status   MRSA, PCR NEGATIVE NEGATIVE Final   Staphylococcus aureus NEGATIVE NEGATIVE Final    Comment: (NOTE) The Xpert SA Assay (FDA approved for NASAL specimens in patients 32 years of age and older), is one component of a comprehensive surveillance program. It is not intended to diagnose infection nor to guide or monitor treatment. Performed at [facility], [address], [place], [place] [address]       Radiology Studies: US Abdomen Limited RUQ  Result Date: 03/01/2020 CLINICAL DATA:  Abnormal LFTs. EXAM: ULTRASOUND ABDOMEN LIMITED RIGHT UPPER QUADRANT COMPARISON:  CT abdomen pelvis dated Dec 30, 2019.  FINDINGS: Gallbladder: Surgically absent. Common bile duct: Diameter: 5 mm, normal. Liver: No focal lesion identified. Patchy, geographic areas of increased parenchymal echogenicity. Portal vein is patent on color Doppler imaging with normal direction of blood flow towards the liver. Other: None. IMPRESSION: 1. No acute abnormality. 2. Patchy, geographic areas of increased hepatic parenchymal echogenicity, suggestive of underlying liver disease and/or steatosis. Electronically Signed   By: Obie Dredge M.D.   On: 03/01/2020 07:43     Scheduled Meds: . enoxaparin (LOVENOX) injection  40 mg Subcutaneous Q24H  . feeding supplement (ENSURE ENLIVE)  237 mL Oral TID BM  . insulin aspart  0-15 Units Subcutaneous TID AC & HS  . insulin aspart  5 Units Subcutaneous TID WC  . insulin detemir  5 Units Subcutaneous BID  . magnesium oxide  400 mg Oral BID  . methadone  20 mg Oral TID  . nortriptyline  25 mg Oral QHS  . pneumococcal 23 valent vaccine  0.5 mL Intramuscular Tomorrow-1000  . polyethylene glycol  17 g Oral BID  . senna  1 tablet Oral BID  . sodium chloride flush  3 mL Intravenous Q12H  . topiramate  100 mg Oral Daily  . topiramate  200 mg Oral QHS   Continuous Infusions: . sodium chloride 1 mL/hr at 02/28/20 1846  . 0.9 % NaCl with KCl 20 mEq / L 75 mL/hr at 02/28/20 1853     LOS: 8 days   Time Spent in minutes   30 minutes  Hassel Uphoff D.O. on 03/02/2020 at 1:55 PM  Between 7am to 7pm - Please see pager noted on [email or web address]  After 7pm go to [email or web address]  And look for the night coverage person covering for me after hours  [facility] Office  [phone]

## 2020-03-02 NOTE — Consult Note (Signed)
Physical Medicine and Rehabilitation Consult   Reason for Consult:Functional deficits.   Referring Physician: Dr. Ree Kida    HPI: Monica [name] is a 69 y.o. female with history of uncontrolled T2DM with polyneuropathy, cervicalgia, chronic HA, chronic pain- on Methadone/Nucynta (Dr. Naaman Plummer), constipation  (goes once a week), gait disorder with multiple falls. 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 She was admitted via Neurology office on 02/23/20 due to increase in weakness and pain for the past 2-3 weeks with numbness/tingling in feel and AKI. She was started on IVF and empirically treated with IVIG with improvement in weakness as well as sensation.   She underwent right sural nerve and superficial quadriceps muscle biopsy by Dr. Ronnald Ramp on 07/19--> path pending.   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  Abdominal ultrasound done due to abnormal LFTs and showed increased echogenicity suggestive or underlying liver disease and/or steatosis. Hgb A1c- 10.3 and blood sugars remain labile. Pain control improving --has not used IV morphine X 24 hours. use. Has been refusing laxative to manage constipation.  Therapy ongoing and patient now showing improvement in activity tolerance. She has had functional decline in the past month and CIR recommended for follow up therapy.     Pt reports she's been a patient of Dr Naaman Plummer'- our partner for 20+ years.  LBM this AM- dealing a lot with muscle spasms- feels like someone is wringing leg out in different directions- tizanidine takes the edge off of it. On 2 mg q6 hours prn. Doesn't "jump" but "sometimes will vibrate".   Hasn't taken IV morphine in 1+ day.    Waiting for biopsy results.     Review of Systems  Constitutional: Negative for chills and fever.  HENT: Negative for hearing loss and tinnitus.   Eyes: Negative for blurred vision and double vision.  Respiratory: Negative for cough and shortness of breath.   Cardiovascular: Negative for chest pain, palpitations and leg swelling.  Gastrointestinal: Negative for heartburn and nausea.  Genitourinary: Negative for dysuria and urgency.  Musculoskeletal: Positive for myalgias and neck pain. Negative for back pain.       Sharp right groin pain since EMG. Numbness left foot from ankle down and RLE.  Skin: Negative for itching and rash.  Neurological: Positive for sensory change (RLE numb most of time but has severe dysesthesias to touch. ) and headaches (constant due to neck injury years ago). Negative for dizziness.  Psychiatric/Behavioral: The patient has insomnia.   All other systems reviewed and are negative.    Past Medical History:  Diagnosis Date   Arthritis    Cervical facet joint syndrome 04/22/2012   Cervicalgia    Chronic headaches    Dr Tessa Lerner [place] [facility]   Chronic nausea    normal GES    Complication of anesthesia    had problem with local anesthesia-tried to assist Physician   DM (diabetes mellitus) ([place])    Elevated liver enzymes    hx of, normal 03/2010   GERD (gastroesophageal reflux disease) 05/31/2011   History Schatzki's ring, erosive reflux esophagitis, status post last EGD and dilation 04/05/10    Glaucoma    lens in both eyes   Hemorrhoids 11/17/08   Colonoscopy Dr Laural Golden   Occipital neuralgia  PTSD (post-traumatic stress disorder)    Renal lithiasis    Seizure ([place])    12 yrs ago-med related   Torticollis    Unspecified musculoskeletal disorders and symptoms referable to neck    cervical/trapezius    Past Surgical History:  Procedure Laterality Date   BREAST LUMPECTOMY     left   CATARACT EXTRACTION W/PHACO Left  10/07/2016   Procedure: CATARACT EXTRACTION PHACO AND INTRAOCULAR LENS PLACEMENT ([place]);  Surgeon: Tonny Branch, MD;  Location: [facility];  Service: Ophthalmology;  Laterality: Left;  CDE: 4.98   CATARACT EXTRACTION W/PHACO Right 10/21/2016   Procedure: CATARACT EXTRACTION PHACO AND INTRAOCULAR LENS PLACEMENT RIGHT EYE CDE - 6.24;  Surgeon: Tonny Branch, MD;  Location: [facility];  Service: Ophthalmology;  Laterality: Right;  right   CHOLECYSTECTOMY     COLONOSCOPY  11/17/08   ext hemorrhoids   CRANIOTOMY     secondary TBI   CYST EXCISION     left wrist   ESOPHAGOGASTRODUODENOSCOPY  04/05/10   [name] (19F),erosive reflux esophagitis, small hiatal hernia,   ESOPHAGOGASTRODUODENOSCOPY (EGD) WITH PROPOFOL N/A 08/30/2019   Procedure: ESOPHAGOGASTRODUODENOSCOPY (EGD) WITH PROPOFOL;  Surgeon: Daneil Dolin, MD;  Location: [facility];  Service: Endoscopy;  Laterality: N/A;  3:30pm - pt knows to arrive at 9:45   [place] LITHOTRIPSY Left 12/13/2019   Procedure: EXTRACORPOREAL SHOCK WAVE LITHOTRIPSY (ESWL);  Surgeon: Festus Aloe, MD;  Location: [facility];  Service: Urology;  Laterality: Left;   FINGER SURGERY     removal cyst left pinky   gastro empy study  03/03/10   normal   HAND SURGERY     M[place] DILATION N/A 08/30/2019   Procedure: Venia Minks DILATION;  Surgeon: Daneil Dolin, MD;  Location: [facility];  Service: Endoscopy;  Laterality: N/A;   NECK SURGERY  1998   NEPHROLITHOTOMY  05/13/2012   Procedure: NEPHROLITHOTOMY PERCUTANEOUS;  Surgeon: Franchot Gallo, MD;  Location: [facility];  Service: Urology;  Laterality: Left;      SURAL NERVE BX Right 02/28/2020   Procedure: SURAL NERVE / Quadricep BIOPSY;  Surgeon: Eustace Moore, MD;  Location: [facility];  Service: Neurosurgery;  Laterality: Right;    Family History  Problem Relation Age of Onset   Colon cancer Mother 38   Heart disease Father    Diabetes Father    Diabetes Paternal  Grandmother    Heart disease Paternal Grandfather    Diverticulitis Daughter     Social History: Married. Independent --has been using a walker since March/wheelchair since 4 weeks. She  reports that she has never smoked. She has never used smokeless tobacco. She reports that she does not drink alcohol and does not use drugs.    Allergies  Allergen Reactions   Aspirin Anaphylaxis and Hives    Slurred speech, blurred vision- also   Hydromorphone Hcl Shortness Of Breath and Other (See Comments)    Severe shortness of breath   Vimpat [Lacosamide] Other (See Comments)    Hallucinations, out of body experience, confusion    Depakote [Divalproex Sodium] Other (See Comments)    Reaction not recalled by husband   Milnacipran Hcl Other (See Comments)    Dizziness    Pristiq [Desvenlafaxine Succinate Monohydrate] Other (See Comments)    Dizziness    Capsaicin Rash and Other (See Comments)    Red rash   Gabapentin Other (See Comments)    States leg pain worsened, pt d/c'd    Medications Prior to  Admission  Medication Sig Dispense Refill   bisacodyl (BISACODYL) 5 MG EC tablet Take 5 mg by mouth daily as needed for mild constipation or moderate constipation.      dapagliflozin propanediol (FARXIGA) 10 MG TABS tablet Take 1 tablet (10 mg total) by mouth daily before breakfast. 30 tablet 2   magnesium oxide (MAG-OX) 400 (241.3 Mg) MG tablet Take 1 tablet (400 mg total) by mouth 2 (two) times daily. 60 tablet 2   metFORMIN (GLUCOPHAGE) 500 MG tablet Take 2 tablets (1,000 mg total) by mouth 2 (two) times daily. 360 tablet 1   methadone (DOLOPHINE) 10 MG tablet Take 2 tablets (20 mg total) by mouth 3 (three) times daily. 180 tablet 0   nortriptyline (PAMELOR) 25 MG capsule TAKE 1 CAPSULE BY MOUTH AT BEDTIME. (Patient taking differently: Take 25 mg by mouth at bedtime. ) 30 capsule 0   ondansetron (ZOFRAN) 4 MG tablet Take 1 tablet (4 mg total) by mouth every 8 (eight) hours as  needed for nausea or vomiting. 4 tablet 0   tapentadol (NUCYNTA) 50 MG tablet Take 1 tablet (50 mg total) by mouth every 12 (twelve) hours as needed. (Patient taking differently: Take 50 mg by mouth every 12 (twelve) hours as needed (for breakthrough migraines). ) 60 tablet 0   tiZANidine (ZANAFLEX) 2 MG tablet TAKE (1) TABLET EVERY SIX HOURS AS NEEDED FOR MUSCLE SPASMS. (Patient taking differently: Take 2 mg by mouth every 6 (six) hours as needed for muscle spasms. ) 60 tablet 1   topiramate (TOPAMAX) 50 MG tablet TAKE 2 TABLETS IN THE MORNING AND 4 TABLETS AT BEDTIME. (Patient taking differently: Take 100-200 mg by mouth See admin instructions. Take 100 mg by mouth in the morning and 200 mg at bedtime) 180 tablet 0   ALPRAZolam (XANAX) 0.25 MG tablet Take 1-2 tabs (0.'25mg'$ -0.'50mg'$ ) 30-60 minutes before procedure. May repeat if needed.Do not drive. (Patient not taking: Reported on 02/23/2020) 4 tablet 0   atorvastatin (LIPITOR) 10 MG tablet Take 1 tablet (10 mg total) by mouth daily. (Patient not taking: Reported on 02/23/2020) 30 tablet 2   blood glucose meter kit and supplies KIT Dispense based on patient and insurance preference. Use up to four times daily as directed. (FOR ICD-9 250.00, 250.01). 1 each 0   predniSONE (DELTASONE) 20 MG tablet Take 3 tablets (60 mg total) by mouth daily with breakfast. First dose may be taken at anytime (Patient not taking: Reported on 02/23/2020) 15 tablet 0    Home: Home Living Family/patient expects to be discharged to:: Private residence Living Arrangements: Spouse/significant other Available Help at Discharge: Family, Available 24 hours/day Type of Home: House Home Access: Stairs to enter [employer] of Steps: 5 on side. 4 in front Entrance Stairs-Rails: Right Home Layout: Multi-level, Able to live on main level with bedroom/bathroom Alternate Level Stairs-Number of Steps: ~12 Alternate Level Stairs-Rails: Left Bathroom Shower/Tub:  Tub/shower unit, Multimedia programmer: Handicapped height Bathroom Accessibility: Yes Home Equipment: Environmental consultant - 2 wheels, Cane - single point  Functional History: Prior Function Level of Independence: Needs assistance Gait / Transfers Assistance Needed: still managing stairs >3 weeks ago with [facility]; assisted with transfers ADL's / Homemaking Assistance Needed: husband assists with bathing and dressing. Pt stands in shower- independent. Husband completes all iADLs. Comments: Plans to use downnstairs walk in shower Functional Status:  Mobility: Bed Mobility Overal bed mobility: Needs Assistance Bed Mobility: Supine to Sit, Sit to Supine Supine to sit: Min assist, HOB elevated Sit  to supine: Min assist, HOB elevated General bed mobility comments: Required assist for L LE with transfers.  Required increased time and use of bed rails Transfers Overall transfer level: Needs assistance Equipment used: Rolling walker (2 wheeled) Transfer via Lift Equipment: Stedy Transfers: Sit to/from Stand Sit to Stand: Mod assist (from normal height surface) Stand pivot transfers: Min assist, +2 physical assistance, +2 safety/equipment, From elevated surface General transfer comment: Performed sit to stand x 2 from normal height; required mod A to power up and steady when moving hands to walker Ambulation/Gait Ambulation/Gait assistance: Min assist Gait Distance (Feet): 15 Feet (2' then 15') Assistive device: Rolling walker (2 wheeled) Gait Pattern/deviations: Step-to pattern, Decreased stride length, Steppage, Decreased dorsiflexion - right, Decreased dorsiflexion - left General Gait Details: Required cues for RW adn posture; required assist with RW for turns and in tight areas.  Pt with foot drop/steppage gait bilaterally - reports this is new for L side. Gait velocity: reduced Gait velocity interpretation: <1.31 ft/sec, indicative of household ambulator    ADL: ADL Overall ADL's : Needs  assistance/impaired Eating/Feeding: Set up, Sitting, Bed level Grooming: Set up, Sitting, Bed level Upper Body Bathing: Minimal assistance, Sitting, Bed level Lower Body Bathing: Moderate assistance, Cueing for safety, Sitting/lateral leans, Sit to/from stand Upper Body Dressing : Minimal assistance, Sitting, Bed level Lower Body Dressing: Moderate assistance, Sitting/lateral leans, Bed level Toilet Transfer: Minimal assistance, +2 for physical assistance, +2 for safety/equipment, RW, Stand-pivot Toilet Transfer Details (indicate cue type and reason): taking a few steps toward [facility] Toileting- Clothing Manipulation and Hygiene: Moderate assistance, +2 for physical assistance, +2 for safety/equipment, Sitting/lateral lean Functional mobility during ADLs: Minimal assistance, +2 for physical assistance, Rolling walker, Cueing for safety General ADL Comments: Pt limited by decreased strength, decreased activity tolerance, increased pain and decreased ability to care for self.  Cognition: Cognition Overall Cognitive Status: Within Functional Limits for tasks assessed Orientation Level: Oriented X4 Cognition Arousal/Alertness: Awake/alert Behavior During Therapy: WFL for tasks assessed/performed Overall Cognitive Status: Within Functional Limits for tasks assessed General Comments: motivated to get up to [facility]   Blood pressure 133/88, pulse 87, temperature 98.4 F (36.9 C), temperature source Oral, resp. rate 18, height _0  (1.626 m), weight 60 kg, SpO2 98 %. Physical Exam Vitals and nursing note reviewed.  Constitutional:      Appearance: Normal appearance.     Comments: Older woman,  lying in bed- frail appearing- husband at bedside, pillow around neck, NAD  HENT:     Head: Normocephalic and atraumatic.     Comments: Equal smile    Right Ear: External ear normal.     Left Ear: External ear normal.     Nose: Nose normal. No congestion or rhinorrhea.     Mouth/Throat:     Mouth: Mucous  membranes are moist.     Pharynx: Oropharynx is clear. No oropharyngeal exudate or posterior oropharyngeal erythema.  Eyes:     Extraocular Movements: Extraocular movements intact.     Comments: No nystagmus  Neck:     Comments: Neck muscles tight Cardiovascular:     Rate and Rhythm: Tachycardia present.     Comments: RRR- no M/R/G Pulmonary:     Comments: CTA B/L- no W/R/R- good air movement Abdominal:     Comments: Soft, NT, ND, (+)BS, hypoactive  Musculoskeletal:     Cervical back: Normal range of motion. Tenderness present.     Comments: UEs- 5/5 in deltoids, biceps, triceps, WE< grip and finger abd B/L  LEs- RLE- HF 2-/5, KE 2-/5, KF 1/5, DF 2-/5, PF 2-/5 LLE- HF 3+/5, KE 3+/5, KF 3+/5, DF 1/5, PF 2-/5 Per pt, L foot drop has occurred over last 2 days- finished IVIG Monday night  Skin:    General: Skin is dry.     Comments: IV L hand- no infiltrate  Neurological:     Mental Status: She is alert and oriented to person, place, and time.     Motor: Weakness present.     Coordination: Coordination abnormal.     Comments: Intact sensation to light touch in all 4 extrmities Trace achilles L DTR- otherwise LE DTrs absent  Psychiatric:     Comments: appropriate     Results for orders placed or performed during the hospital encounter of 02/23/20 (from the past 24 hour(s))  Glucose, capillary     Status: Abnormal   Collection Time: 03/01/20 11:54 AM  Result Value Ref Range   Glucose-Capillary 195 (H) 70 - 99 mg/dL  Glucose, capillary     Status: Abnormal   Collection Time: 03/01/20  4:58 PM  Result Value Ref Range   Glucose-Capillary 252 (H) 70 - 99 mg/dL  Glucose, capillary     Status: Abnormal   Collection Time: 03/01/20  9:15 PM  Result Value Ref Range   Glucose-Capillary 335 (H) 70 - 99 mg/dL   Comment 1 Notify RN    Comment 2 Document in Chart   Hemoglobin and hematocrit, blood     Status: Abnormal   Collection Time: 03/02/20  4:55 AM  Result Value Ref Range    Hemoglobin 10.3 (L) 12.0 - 15.0 g/dL   HCT 33.2 (L) 36 - 46 %  Comprehensive metabolic panel     Status: Abnormal   Collection Time: 03/02/20  4:55 AM  Result Value Ref Range   Sodium 134 (L) 135 - 145 mmol/L   Potassium 4.4 3.5 - 5.1 mmol/L   Chloride 101 98 - 111 mmol/L   CO2 25 22 - 32 mmol/L   Glucose, Bld 208 (H) 70 - 99 mg/dL   BUN 20 8 - 23 mg/dL   Creatinine, Ser 0.96 0.44 - 1.00 mg/dL   Calcium 9.2 8.9 - 10.3 mg/dL   Total Protein 7.4 6.5 - 8.1 g/dL   Albumin 2.6 (L) 3.5 - 5.0 g/dL   AST 22 15 - 41 U/L   ALT 30 0 - 44 U/L   Alkaline Phosphatase 110 38 - 126 U/L   Total Bilirubin 0.6 0.3 - 1.2 mg/dL   GFR calc non Af Amer >60 >60 mL/min   GFR calc Af Amer >60 >60 mL/min   Anion gap 8 5 - 15  Glucose, capillary     Status: Abnormal   Collection Time: 03/02/20  6:14 AM  Result Value Ref Range   Glucose-Capillary 183 (H) 70 - 99 mg/dL   Comment 1 Notify RN    Comment 2 Document in Chart   Glucose, capillary     Status: Abnormal   Collection Time: 03/02/20  7:38 AM  Result Value Ref Range   Glucose-Capillary 152 (H) 70 - 99 mg/dL   US Abdomen Limited RUQ  Result Date: 03/01/2020 CLINICAL DATA:  Abnormal LFTs. EXAM: ULTRASOUND ABDOMEN LIMITED RIGHT UPPER QUADRANT COMPARISON:  CT abdomen pelvis dated Dec 30, 2019. FINDINGS: Gallbladder: Surgically absent. Common bile duct: Diameter: 5 mm, normal. Liver: No focal lesion identified. Patchy, geographic areas of increased parenchymal echogenicity. Portal vein is patent on color Doppler imaging with  normal direction of blood flow towards the liver. Other: None. IMPRESSION: 1. No acute abnormality. 2. Patchy, geographic areas of increased hepatic parenchymal echogenicity, suggestive of underlying liver disease and/or steatosis. Electronically Signed   By: Titus Dubin M.D.   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 Does the need for close, 24 hr/day medical supervision in concert with the patient's rehab needs make it unreasonable for this patient to be served in a less intensive setting? Yes 2. Co-Morbidities requiring supervision/potential complications: DM- Q9U 8.6, PTSD, migraines, chronic pain syndrome 3. Due to bowel management, safety, skin/wound care, disease management, medication administration, pain management and patient education, does the patient require 24 hr/day rehab nursing? Yes 4. Does the patient require coordinated care of a physician, rehab nurse, therapy disciplines of PT and OT to address physical and functional deficits in the context of the above medical diagnosis(es)? Yes Addressing deficits in the following areas: balance, endurance, locomotion, strength, transferring, bathing, dressing, feeding, grooming and toileting 5. Can the patient actively participate in an intensive therapy program of at least 3 hrs of therapy per day at least 5 days per week? Yes 6. The potential for patient to make measurable gains while on inpatient rehab is good and fair 7. Anticipated functional outcomes upon discharge from inpatient rehab are modified independent and supervision  with PT, modified independent and supervision with OT, n/a with SLP. 8. Estimated rehab length of stay to reach the above functional goals is: 2.5-3 weeks 9. Anticipated discharge destination: Home 10. Overall Rehab/Functional Prognosis: good and fair  RECOMMENDATIONS: This patient's condition is appropriate for continued rehabilitative care in the following setting: CIR Patient has agreed to participate in recommended program. Potentially Note that insurance prior authorization may be required for reimbursement for recommended care.  Comment:  1. Pt's diagnosis is not quite nailed down- but thinking inflammatory polyneuropathy- EMG done; biopsy pending 2. Pt having a lot of muscle spasms- suggest  increasing Zanaflex if can tolerate sedation for better spasm control-  3. LLE is getting weaker in last 2 days- is weaker than last strength exam noted- significantly- just wanted to make sure Neurology aware- since 1 days after IVIG completed, if note.  4. Will submit for inpt insurance approval, however, will need to see if have dx determined clearly, and stabilized so not getting worse prior to CIR admission.  5. Thank you for rehab consult.     Bary Leriche, PA-C 03/02/2020   I have personally performed a face to face diagnostic evaluation of this patient and formulated the key components of the plan.  Additionally, I have personally reviewed laboratory data, imaging studies, as well as relevant notes and concur with the physician assistant's documentation above.

## 2020-03-02 NOTE — Progress Notes (Signed)
Inpatient Rehabilitation Admissions Coordinator  Inpatient rehab consult received. I met with patient and her spouse at bedside. We discussed goals and expectations of a possible inpt rehab admit. They prefer inpt rehab admit prior to return home. I will follow up tomorrow to clarify likelihood of a bed available. Discussed with Dr. Dagoberto Ligas.  Danne Baxter, RN, MSN Rehab Admissions Coordinator [phone] 03/02/2020 12:33 PM

## 2020-03-02 NOTE — Plan of Care (Signed)
  Problem: Coping: Goal: Level of anxiety will decrease Outcome: Progressing   Problem: Pain Managment: Goal: General experience of comfort will improve Outcome: Progressing   Problem: Safety: Goal: Ability to remain free from injury will improve Outcome: Progressing   

## 2020-03-03 ENCOUNTER — Other Ambulatory Visit: Payer: Self-pay

## 2020-03-03 ENCOUNTER — Encounter (HOSPITAL_COMMUNITY): Payer: Self-pay | Admitting: Physical Medicine & Rehabilitation

## 2020-03-03 ENCOUNTER — Inpatient Hospital Stay (HOSPITAL_COMMUNITY)
Admission: RE | Admit: 2020-03-03 | Discharge: 2020-03-24 | DRG: 074 | Disposition: A | Discharge status: Home Health | Payer: Medicare Other | Source: Intra-hospital | Attending: Physical Medicine & Rehabilitation | Admitting: Physical Medicine & Rehabilitation

## 2020-03-03 DIAGNOSIS — K219 Gastro-esophageal reflux disease without esophagitis: Secondary | ICD-10-CM | POA: Diagnosis present

## 2020-03-03 DIAGNOSIS — L89312 Pressure ulcer of right buttock, stage 2: Secondary | ICD-10-CM | POA: Diagnosis not present

## 2020-03-03 DIAGNOSIS — L89612 Pressure ulcer of right heel, stage 2: Secondary | ICD-10-CM | POA: Diagnosis not present

## 2020-03-03 DIAGNOSIS — R296 Repeated falls: Secondary | ICD-10-CM | POA: Diagnosis present

## 2020-03-03 DIAGNOSIS — F4312 Post-traumatic stress disorder, chronic: Secondary | ICD-10-CM | POA: Diagnosis present

## 2020-03-03 DIAGNOSIS — F4310 Post-traumatic stress disorder, unspecified: Secondary | ICD-10-CM | POA: Diagnosis present

## 2020-03-03 DIAGNOSIS — M21372 Foot drop, left foot: Secondary | ICD-10-CM | POA: Diagnosis present

## 2020-03-03 DIAGNOSIS — E1144 Type 2 diabetes mellitus with diabetic amyotrophy: Principal | ICD-10-CM | POA: Diagnosis present

## 2020-03-03 DIAGNOSIS — G4700 Insomnia, unspecified: Secondary | ICD-10-CM | POA: Diagnosis present

## 2020-03-03 DIAGNOSIS — E1165 Type 2 diabetes mellitus with hyperglycemia: Secondary | ICD-10-CM | POA: Diagnosis present

## 2020-03-03 DIAGNOSIS — E871 Hypo-osmolality and hyponatremia: Secondary | ICD-10-CM | POA: Diagnosis present

## 2020-03-03 DIAGNOSIS — R945 Abnormal results of liver function studies: Secondary | ICD-10-CM | POA: Diagnosis not present

## 2020-03-03 DIAGNOSIS — F1190 Opioid use, unspecified, uncomplicated: Secondary | ICD-10-CM | POA: Diagnosis present

## 2020-03-03 DIAGNOSIS — Z885 Allergy status to narcotic agent status: Secondary | ICD-10-CM

## 2020-03-03 DIAGNOSIS — Z886 Allergy status to analgesic agent status: Secondary | ICD-10-CM

## 2020-03-03 DIAGNOSIS — G894 Chronic pain syndrome: Secondary | ICD-10-CM

## 2020-03-03 DIAGNOSIS — G610 Guillain-Barre syndrome: Secondary | ICD-10-CM | POA: Diagnosis present

## 2020-03-03 DIAGNOSIS — D696 Thrombocytopenia, unspecified: Secondary | ICD-10-CM | POA: Diagnosis present

## 2020-03-03 DIAGNOSIS — G8929 Other chronic pain: Secondary | ICD-10-CM | POA: Diagnosis present

## 2020-03-03 DIAGNOSIS — K5903 Drug induced constipation: Secondary | ICD-10-CM | POA: Diagnosis present

## 2020-03-03 DIAGNOSIS — E876 Hypokalemia: Secondary | ICD-10-CM | POA: Diagnosis present

## 2020-03-03 DIAGNOSIS — Z79891 Long term (current) use of opiate analgesic: Secondary | ICD-10-CM

## 2020-03-03 DIAGNOSIS — Z7952 Long term (current) use of systemic steroids: Secondary | ICD-10-CM

## 2020-03-03 DIAGNOSIS — G6181 Chronic inflammatory demyelinating polyneuritis: Secondary | ICD-10-CM | POA: Diagnosis present

## 2020-03-03 DIAGNOSIS — T380X5A Adverse effect of glucocorticoids and synthetic analogues, initial encounter: Secondary | ICD-10-CM | POA: Diagnosis present

## 2020-03-03 DIAGNOSIS — D649 Anemia, unspecified: Secondary | ICD-10-CM | POA: Diagnosis present

## 2020-03-03 DIAGNOSIS — M62830 Muscle spasm of back: Secondary | ICD-10-CM | POA: Diagnosis not present

## 2020-03-03 DIAGNOSIS — R519 Headache, unspecified: Secondary | ICD-10-CM | POA: Diagnosis not present

## 2020-03-03 DIAGNOSIS — Z7984 Long term (current) use of oral hypoglycemic drugs: Secondary | ICD-10-CM

## 2020-03-03 DIAGNOSIS — G43711 Chronic migraine without aura, intractable, with status migrainosus: Secondary | ICD-10-CM

## 2020-03-03 DIAGNOSIS — Z833 Family history of diabetes mellitus: Secondary | ICD-10-CM

## 2020-03-03 DIAGNOSIS — L89610 Pressure ulcer of right heel, unstageable: Secondary | ICD-10-CM | POA: Insufficient documentation

## 2020-03-03 DIAGNOSIS — M47892 Other spondylosis, cervical region: Secondary | ICD-10-CM | POA: Diagnosis present

## 2020-03-03 DIAGNOSIS — L8990 Pressure ulcer of unspecified site, unspecified stage: Secondary | ICD-10-CM | POA: Insufficient documentation

## 2020-03-03 DIAGNOSIS — F329 Major depressive disorder, single episode, unspecified: Secondary | ICD-10-CM | POA: Diagnosis present

## 2020-03-03 DIAGNOSIS — Z9049 Acquired absence of other specified parts of digestive tract: Secondary | ICD-10-CM

## 2020-03-03 DIAGNOSIS — Z888 Allergy status to other drugs, medicaments and biological substances status: Secondary | ICD-10-CM

## 2020-03-03 DIAGNOSIS — Z79899 Other long term (current) drug therapy: Secondary | ICD-10-CM

## 2020-03-03 DIAGNOSIS — G4486 Cervicogenic headache: Secondary | ICD-10-CM

## 2020-03-03 DIAGNOSIS — Z5181 Encounter for therapeutic drug level monitoring: Secondary | ICD-10-CM

## 2020-03-03 DIAGNOSIS — M21371 Foot drop, right foot: Secondary | ICD-10-CM | POA: Diagnosis present

## 2020-03-03 DIAGNOSIS — G43011 Migraine without aura, intractable, with status migrainosus: Secondary | ICD-10-CM

## 2020-03-03 DIAGNOSIS — M7918 Myalgia, other site: Secondary | ICD-10-CM

## 2020-03-03 DIAGNOSIS — G43909 Migraine, unspecified, not intractable, without status migrainosus: Secondary | ICD-10-CM | POA: Diagnosis present

## 2020-03-03 DIAGNOSIS — M47812 Spondylosis without myelopathy or radiculopathy, cervical region: Secondary | ICD-10-CM

## 2020-03-03 LAB — COMPREHENSIVE METABOLIC PANEL
ALT: 28 U/L (ref 0–44)
AST: 23 U/L (ref 15–41)
Albumin: 2.6 g/dL — ABNORMAL LOW (ref 3.5–5.0)
Alkaline Phosphatase: 116 U/L (ref 38–126)
Anion gap: 7 (ref 5–15)
BUN: 20 mg/dL (ref 8–23)
CO2: 27 mmol/L (ref 22–32)
Calcium: 9.3 mg/dL (ref 8.9–10.3)
Chloride: 99 mmol/L (ref 98–111)
Creatinine, Ser: 0.95 mg/dL (ref 0.44–1.00)
GFR calc Af Amer: >60 mL/min (ref >60)
GFR calc non Af Amer: >60 mL/min (ref >60)
Glucose, Bld: 280 mg/dL — ABNORMAL HIGH (ref 70–99)
Potassium: 4.5 mmol/L (ref 3.5–5.1)
Sodium: 133 mmol/L — ABNORMAL LOW (ref 135–145)
Total Bilirubin: 0.4 mg/dL (ref 0.3–1.2)
Total Protein: 7.5 g/dL (ref 6.5–8.1)

## 2020-03-03 LAB — HEMOGLOBIN AND HEMATOCRIT, BLOOD
HCT: 33.7 % — ABNORMAL LOW (ref 36.0–46.0)
Hemoglobin: 10.7 g/dL — ABNORMAL LOW (ref 12.0–15.0)

## 2020-03-03 LAB — GLUCOSE, CAPILLARY
Glucose-Capillary: 176 mg/dL — ABNORMAL HIGH (ref 70–99)
Glucose-Capillary: 267 mg/dL — ABNORMAL HIGH (ref 70–99)
Glucose-Capillary: 259 mg/dL — ABNORMAL HIGH (ref 70–99)
Glucose-Capillary: 273 mg/dL — ABNORMAL HIGH (ref 70–99)

## 2020-03-03 LAB — MAGNESIUM: Magnesium: 2 mg/dL (ref 1.7–2.4)

## 2020-03-03 MED ORDER — ACETAMINOPHEN 325 MG PO TABS
325.0000 mg | ORAL_TABLET | ORAL | Status: DC | PRN
  Administered 2020-03-03 – 2020-03-16 (×17): 650 mg via ORAL
  Administered 2020-03-16: 325 mg via ORAL
  Administered 2020-03-16 – 2020-03-24 (×11): 650 mg via ORAL
  Filled 2020-03-03 (×32): qty 2

## 2020-03-03 MED ORDER — BISACODYL 5 MG PO TBEC
5.0000 mg | DELAYED_RELEASE_TABLET | Freq: Every day | ORAL | Status: DC | PRN
  Administered 2020-03-08 – 2020-03-10 (×3): 5 mg via ORAL
  Filled 2020-03-03 (×3): qty 1

## 2020-03-03 MED ORDER — FLEET ENEMA 7-19 GM/118ML RE ENEM: 1.0000 | ENEMA | Freq: Once | RECTAL | Status: DC | PRN

## 2020-03-03 MED ORDER — PHENOL 1.4 % MT LIQD
1.0000 | OROMUCOSAL | Status: DC | PRN
  Filled 2020-03-03: qty 177

## 2020-03-03 MED ORDER — PROCHLORPERAZINE 25 MG RE SUPP: 12.5000 mg | Freq: Four times a day (QID) | RECTAL | Status: DC | PRN

## 2020-03-03 MED ORDER — PROCHLORPERAZINE EDISYLATE 10 MG/2ML IJ SOLN: 5.0000 mg | Freq: Four times a day (QID) | INTRAMUSCULAR | Status: DC | PRN

## 2020-03-03 MED ORDER — MAGNESIUM OXIDE 400 (241.3 MG) MG PO TABS
400.0000 mg | ORAL_TABLET | Freq: Two times a day (BID) | ORAL | Status: DC
  Administered 2020-03-03 – 2020-03-24 (×42): 400 mg via ORAL
  Filled 2020-03-03 (×42): qty 1

## 2020-03-03 MED ORDER — TOPIRAMATE 100 MG PO TABS
200.0000 mg | ORAL_TABLET | Freq: Every day | ORAL | Status: DC
  Administered 2020-03-03 – 2020-03-23 (×21): 200 mg via ORAL
  Filled 2020-03-03 (×21): qty 2

## 2020-03-03 MED ORDER — MELATONIN 5 MG PO TABS
5.0000 mg | ORAL_TABLET | Freq: Every evening | ORAL | Status: DC | PRN
  Administered 2020-03-03 – 2020-03-10 (×7): 5 mg via ORAL
  Filled 2020-03-03 (×9): qty 1

## 2020-03-03 MED ORDER — NORTRIPTYLINE HCL 25 MG PO CAPS
25.0000 mg | ORAL_CAPSULE | Freq: Every day | ORAL | Status: DC
  Administered 2020-03-03 – 2020-03-19 (×17): 25 mg via ORAL
  Filled 2020-03-03 (×18): qty 1

## 2020-03-03 MED ORDER — INSULIN ASPART 100 UNIT/ML ~~LOC~~ SOLN
5.0000 [IU] | Freq: Three times a day (TID) | SUBCUTANEOUS | Status: DC
  Administered 2020-03-03 – 2020-03-09 (×16): 5 [IU] via SUBCUTANEOUS

## 2020-03-03 MED ORDER — BISACODYL 10 MG RE SUPP: 10.0000 mg | Freq: Every day | RECTAL | Status: DC | PRN

## 2020-03-03 MED ORDER — TOPIRAMATE 100 MG PO TABS
100.0000 mg | ORAL_TABLET | Freq: Every day | ORAL | Status: DC
  Administered 2020-03-04 – 2020-03-24 (×21): 100 mg via ORAL
  Filled 2020-03-03 (×22): qty 1

## 2020-03-03 MED ORDER — GUAIFENESIN-DM 100-10 MG/5ML PO SYRP: 5.0000 mL | ORAL_SOLUTION | Freq: Four times a day (QID) | ORAL | Status: DC | PRN

## 2020-03-03 MED ORDER — DIPHENHYDRAMINE HCL 12.5 MG/5ML PO ELIX: 12.5000 mg | ORAL_SOLUTION | Freq: Four times a day (QID) | ORAL | Status: DC | PRN

## 2020-03-03 MED ORDER — PROCHLORPERAZINE MALEATE 5 MG PO TABS
5.0000 mg | ORAL_TABLET | Freq: Four times a day (QID) | ORAL | Status: DC | PRN
  Filled 2020-03-03: qty 2

## 2020-03-03 MED ORDER — ALUM & MAG HYDROXIDE-SIMETH 200-200-20 MG/5ML PO SUSP: 30.0000 mL | ORAL | Status: DC | PRN

## 2020-03-03 MED ORDER — ENOXAPARIN SODIUM 40 MG/0.4ML ~~LOC~~ SOLN
40.0000 mg | SUBCUTANEOUS | Status: DC
  Administered 2020-03-03 – 2020-03-23 (×21): 40 mg via SUBCUTANEOUS
  Filled 2020-03-03 (×21): qty 0.4

## 2020-03-03 MED ORDER — DIPHENHYDRAMINE-ZINC ACETATE 2-0.1 % EX CREA
TOPICAL_CREAM | Freq: Three times a day (TID) | CUTANEOUS | Status: DC | PRN
  Filled 2020-03-03: qty 28

## 2020-03-03 MED ORDER — ATORVASTATIN CALCIUM 10 MG PO TABS
10.0000 mg | ORAL_TABLET | Freq: Every day | ORAL | Status: DC
  Administered 2020-03-04 – 2020-03-24 (×21): 10 mg via ORAL
  Filled 2020-03-03 (×21): qty 1

## 2020-03-03 MED ORDER — TIZANIDINE HCL 2 MG PO TABS
2.0000 mg | ORAL_TABLET | Freq: Four times a day (QID) | ORAL | Status: DC | PRN
  Administered 2020-03-03 – 2020-03-23 (×39): 2 mg via ORAL
  Filled 2020-03-03 (×40): qty 1

## 2020-03-03 MED ORDER — INSULIN DETEMIR 100 UNIT/ML ~~LOC~~ SOLN
5.0000 [IU] | Freq: Two times a day (BID) | SUBCUTANEOUS | Status: DC
  Administered 2020-03-03 – 2020-03-05 (×4): 5 [IU] via SUBCUTANEOUS
  Filled 2020-03-03 (×5): qty 0.05

## 2020-03-03 MED ORDER — INSULIN ASPART 100 UNIT/ML ~~LOC~~ SOLN
0.0000 [IU] | Freq: Three times a day (TID) | SUBCUTANEOUS | Status: DC
  Administered 2020-03-03: 8 [IU] via SUBCUTANEOUS
  Administered 2020-03-03: 11 [IU] via SUBCUTANEOUS
  Administered 2020-03-04: 3 [IU] via SUBCUTANEOUS
  Administered 2020-03-04: 8 [IU] via SUBCUTANEOUS
  Administered 2020-03-04 (×2): 5 [IU] via SUBCUTANEOUS
  Administered 2020-03-05: 3 [IU] via SUBCUTANEOUS
  Administered 2020-03-05 (×2): 8 [IU] via SUBCUTANEOUS
  Administered 2020-03-05: 11 [IU] via SUBCUTANEOUS
  Administered 2020-03-06: 5 [IU] via SUBCUTANEOUS
  Administered 2020-03-06: 8 [IU] via SUBCUTANEOUS
  Administered 2020-03-06: 5 [IU] via SUBCUTANEOUS
  Administered 2020-03-06: 8 [IU] via SUBCUTANEOUS
  Administered 2020-03-07: 5 [IU] via SUBCUTANEOUS
  Administered 2020-03-07: 3 [IU] via SUBCUTANEOUS
  Administered 2020-03-07 – 2020-03-08 (×3): 8 [IU] via SUBCUTANEOUS
  Administered 2020-03-08: 2 [IU] via SUBCUTANEOUS
  Administered 2020-03-08: 5 [IU] via SUBCUTANEOUS
  Administered 2020-03-09: 8 [IU] via SUBCUTANEOUS

## 2020-03-03 MED ORDER — MENTHOL 3 MG MT LOZG: 1.0000 | LOZENGE | OROMUCOSAL | Status: DC | PRN

## 2020-03-03 MED ORDER — TAPENTADOL HCL 50 MG PO TABS
50.0000 mg | ORAL_TABLET | Freq: Two times a day (BID) | ORAL | Status: DC | PRN
  Administered 2020-03-10: 50 mg via ORAL
  Filled 2020-03-03: qty 1

## 2020-03-03 MED ORDER — METHADONE HCL 10 MG PO TABS
20.0000 mg | ORAL_TABLET | Freq: Three times a day (TID) | ORAL | Status: DC
  Administered 2020-03-03 – 2020-03-24 (×63): 20 mg via ORAL
  Filled 2020-03-03 (×65): qty 2

## 2020-03-03 MED ORDER — ENSURE ENLIVE PO LIQD: 237.0000 mL | Freq: Three times a day (TID) | ORAL | Status: DC

## 2020-03-03 MED ORDER — POLYETHYLENE GLYCOL 3350 17 G PO PACK: 17.0000 g | PACK | Freq: Every day | ORAL | Status: DC | PRN

## 2020-03-03 NOTE — Progress Notes (Signed)
Inpatient Rehabilitation Admissions Coordinator  Inpatient rehab bed is available to admit pt to today. I met with patient at bedside and spoke with her spouse by phone. They are in agreement to admit. I contacted Dr. Ree Kida , acute team and TOC . I will make the arrangements to admit today.  Danne Baxter, RN, MSN Rehab Admissions Coordinator [phone] 03/03/2020 11:05 AM

## 2020-03-03 NOTE — Progress Notes (Signed)
Marcello Fennel, MD  Physician  Physical Medicine and Rehabilitation  PMR Pre-admission      Addendum  Date of Service:  03/03/2020 11:59 AM      Related encounter: ED to Hosp-Admission (Current) from 02/23/2020 in [place] [facility]       Show:Clear all [x] Manual[x] Template[x] Copied  Added by: [x] , RN[x] , MD  [] Hover for details PMR Admission Coordinator Pre-Admission Assessment   Patient: Monica [name] is an 69 y.o., female MRN: DOB: 1951/01/07 Height: 5\' 4"  (162.6 cm) Weight: 60 kg                                                                                                                                                  Insurance Information HMO:     PPO:      PCP:      IPA:      80/20:      OTHER:  PRIMARY: Medicare a and b      Policy#: [id number]      Subscriber: pt Benefits:  Phone #: passport one online     Name: 7/12 Eff. Date: a 07/13/2003 and b 06/12/2005     Deduct: $1484      Out of Pocket Max: none      Life Max: none  CIR: 100%      SNF: 20 full days Outpatient: 80%     Co-Pay: 205 Home Health: 100%      Co-Pay: none DME: 80%     Co-Pay: 20% Providers: pt choice  SECONDARY: A[employer]      Policy#4m      Phone#: pt   Financial Counselor:       Phone#:    The "Data Collection Information Summary" for patients in Inpatient Rehabilitation Facilities with attached "Privacy Act Statement-Health Care Records" was provided and verbally reviewed with: Patient   Emergency Contact Information Contact Information       Name Relation Home Work Mobile    [place] Spouse [phone]         Current Medical History  Patient Admitting Diagnosis: inflammatory polyneuropathy   History of Present Illness: 69 y.o. female with history of uncontrolled T2DM with polyneuropathy, cervicalgia, chronic HA, chronic pain- on Methadone/Nucynta (Dr. [phone]), constipation  (goes once  a week), gait disorder with multiple falls. 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 She was admitted via Neurology office on 02/23/20 due to increase in weakness and pain for the past 2-3 weeks with numbness/tingling in feel and AKI. She was started on IVF and empirically treated with IVIG with improvement in weakness as well as sensation.   She underwent  right sural nerve and superficial quadriceps muscle biopsy by Dr. Yetta Barre on 07/19--> path pending.    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  Abdominal ultrasound done due to abnormal LFTs and showed increased echogenicity suggestive or underlying liver disease and/or steatosis. Hgb A1c- 10.3 and blood sugars remain labile. Pain control improving --has not used IV morphine X 24 hours. use. Has been refusing laxative to manage constipation.  Therapy ongoing and patient now showing improvement in activity tolerance. She has had functional decline in the past month and CIR recommended for follow up therapy.    Past Medical History      Past Medical History:  Diagnosis Date  . Arthritis    . Cervical facet joint syndrome 04/22/2012  . Cervicalgia    . Chronic headaches      Dr Hermelinda [facility] [place] [facility]  . Chronic nausea      normal GES   . Complication of anesthesia      had problem with local anesthesia-tried to assist Physician  . DM (diabetes mellitus) (HCC)    . Elevated liver enzymes      hx of, normal 03/2010  . GERD (gastroesophageal reflux disease) 05/31/2011    History Schatzki's ring, erosive reflux esophagitis, status post EGD and dilation 8/11 and 11/20  . Glaucoma      lens in both eyes  . Hemorrhoids 11/17/08    Colonoscopy Dr Karilyn Cota  .  Occipital neuralgia    . PTSD (post-traumatic stress disorder)    . Renal lithiasis    . Seizure (HCC)      12 yrs ago-med related  . Torticollis    . Unspecified musculoskeletal disorders and symptoms referable to neck      cervical/trapezius      Family History  family history includes Colon cancer (age of onset: 70) in her mother; Diabetes in her father and paternal grandmother; Diverticulitis in her daughter; Heart disease in her father and paternal grandfather.   Prior Rehab/Hospitalizations:  Has the patient had prior rehab or hospitalizations prior to admission? Yes   Has the patient had major surgery during 100 days prior to admission? No   Current Medications    Current Facility-Administered Medications:  .  0.9 %  sodium chloride infusion, 250 mL, Intravenous, Continuous, Tia Alert, MD, Last Rate: 1 mL/hr at 02/28/20 1846, Rate Verify at 02/28/20 1846 .  0.9 % NaCl with KCl 20 mEq/ L  infusion, , Intravenous, Continuous, Tia Alert, MD, Last Rate: 75 mL/hr at 02/28/20 1853, New Bag at 02/28/20 1853 .  acetaminophen (TYLENOL) tablet 650 mg, 650 mg, Oral, Q4H PRN **OR** acetaminophen (TYLENOL) suppository 650 mg, 650 mg, Rectal, Q4H PRN, Tia Alert, MD .  atorvastatin (LIPITOR) tablet 10 mg, 10 mg, Oral, Daily, Mikhail, [place], DO, 10 mg at 03/03/20 1034 .  bisacodyl (DULCOLAX) EC tablet 5 mg, 5 mg, Oral, Daily PRN, Tia Alert, MD, 5 mg at 03/02/20 0914 .  bisacodyl (DULCOLAX) suppository 10 mg, 10 mg, Rectal, Daily PRN, Edsel Petrin, DO .  diphenhydrAMINE-zinc acetate (BENADRYL) 2-0.1 % cream, , Topical, TID PRN, Tia Alert, MD, 1 application at 02/28/20 0215 .  enoxaparin (LOVENOX) injection 40 mg, 40 mg, Subcutaneous, Q24H, Tia Alert, MD, 40 mg at 03/02/20 1949 .  feeding supplement (ENSURE ENLIVE) (ENSURE ENLIVE) liquid 237 mL, 237 mL, Oral, TID BM, Tia Alert, MD, 237 mL at 03/02/20 1948 .  insulin  aspart (novoLOG) injection 0-15 Units,  0-15 Units, Subcutaneous, TID AC & HS, Edsel Petrin, DO, 3 Units at 03/03/20 0740 .  insulin aspart (novoLOG) injection 5 Units, 5 Units, Subcutaneous, TID WC, Mikhail, [place], DO, 5 Units at 03/03/20 0741 .  insulin detemir (LEVEMIR) injection 5 Units, 5 Units, Subcutaneous, BID, Marguerita Merles [place], [place], 5 Units at 03/03/20 1042 .  magnesium oxide (MAG-OX) tablet 400 mg, 400 mg, Oral, BID, Tia Alert, MD, 400 mg at 03/03/20 1033 .  menthol-cetylpyridinium (CEPACOL) lozenge 3 mg, 1 lozenge, Oral, PRN **OR** phenol (CHLORASEPTIC) mouth spray 1 spray, 1 spray, Mouth/Throat, PRN, Tia Alert, MD .  methadone (DOLOPHINE) tablet 20 mg, 20 mg, Oral, TID, Tia Alert, MD, 20 mg at 03/03/20 1127 .  morphine 2 MG/ML injection 1 mg, 1 mg, Intravenous, Q3H PRN, Marguerita Merles Latif, DO, 1 mg at 03/01/20 0421 .  nortriptyline (PAMELOR) capsule 25 mg, 25 mg, Oral, QHS, Tia Alert, MD, 25 mg at 03/02/20 2155 .  ondansetron (ZOFRAN) tablet 4 mg, 4 mg, Oral, Q6H PRN **OR** ondansetron (ZOFRAN) injection 4 mg, 4 mg, Intravenous, Q6H PRN, Tia Alert, MD, 4 mg at 02/29/20 1610 .  pneumococcal 23 valent vaccine (PNEUMOVAX-23) injection 0.5 mL, 0.5 mL, Intramuscular, Tomorrow-1000, Sheikh, Omair Latif, DO .  polyethylene glycol (MIRALAX / GLYCOLAX) packet 17 g, 17 g, Oral, BID, Tia Alert, MD, 17 g at 02/29/20 2151 .  senna (SENOKOT) tablet 8.6 mg, 1 tablet, Oral, BID, Tia Alert, MD, 8.6 mg at 03/01/20 2132 .  sodium chloride flush (NS) 0.9 % injection 3 mL, 3 mL, Intravenous, Q12H, Tia Alert, MD, 3 mL at 03/03/20 1040 .  sodium chloride flush (NS) 0.9 % injection 3 mL, 3 mL, Intravenous, PRN, Tia Alert, MD .  tapentadol (NUCYNTA) tablet 50 mg, 50 mg, Oral, Q12H PRN, Tia Alert, MD, 50 mg at 02/29/20 0145 .  tiZANidine (ZANAFLEX) tablet 2 mg, 2 mg, Oral, Q6H PRN, Tia Alert, MD, 2 mg at 03/03/20 1038 .  topiramate (TOPAMAX) tablet 100 mg, 100 mg, Oral, Daily, Tia Alert, MD, 100 mg at 03/03/20 1034 .  topiramate (TOPAMAX) tablet 200 mg, 200 mg, Oral, QHS, Tia Alert, MD, 200 mg at 03/02/20 2154   Patients Current Diet:  Diet Order                  Diet heart healthy/carb modified Room service appropriate? Yes; Fluid consistency: Thin  Diet effective now                         Precautions / Restrictions Precautions Precautions: Fall Precaution Comments: weak and unsteady RLE Restrictions Weight Bearing Restrictions: No Other Position/Activity Restrictions: no braces have been obtained    Has the patient had 2 or more falls or a fall with injury in the past year?Yes 13 times since January per patient   Prior Activity Level Limited Community (1-2x/wk): gradual decline in function over last several months   Prior Functional Level Prior Function Level of Independence: Needs assistance Gait / Transfers Assistance Needed: still managing stairs >3 weeks ago with [facility]; assisted with transfers ADL's / Homemaking Assistance Needed: husband assists with bathing and dressing. Pt stands in shower- independent. Husband completes all iADLs. Comments: Plans to use downnstairs walk in shower   Self Care: Did the patient need help bathing, dressing, using the toilet or eating?  Needed some help  Indoor Mobility: Did the patient need assistance with walking from room to room (with or without device)? Needed some help   Stairs: Did the patient need assistance with internal or external stairs (with or without device)? Needed some help   Functional Cognition: Did the patient need help planning regular tasks such as shopping or remembering to take medications? Independent   Home Assistive Devices / Equipment Home Assistive Devices/Equipment: Environmental consultantWalker (specify type), Wheelchair Home Equipment: Walker - 2 wheels, Cane - single point   Prior Device Use: Indicate devices/aids used by the patient prior to current illness, exacerbation or injury? Walker     Current Functional Level Cognition   Overall Cognitive Status: Within Functional Limits for tasks assessed Orientation Level: Oriented X4 General Comments[facility]    Extremity Assessment (includes Sensation/Coordination)   Upper Extremity Assessment: Generalized weakness  Lower Extremity Assessment: Generalized weakness, Defer to PT evaluation RLE Deficits / Details: weakness noted, decreased ROM RLE Coordination: decreased fine motor, decreased gross motor LLE Coordination: decreased gross motor     ADLs   Overall ADL's : Needs assistance/impaired Eating/Feeding: Set up, Sitting, Bed level Grooming: Set up, Sitting, Bed level Upper Body Bathing: Minimal assistance, Sitting, Bed level Lower Body Bathing: Moderate assistance, Cueing for safety, Sitting/lateral leans, Sit to/from stand Upper Body Dressing : Minimal assistance, Sitting, Bed level Lower Body Dressing: Moderate assistance, Sitting/lateral leans, Bed level Toilet Transfer: Minimal assistance, +2 for physical assistance, +2 for safety/equipment, RW, Stand-pivot Toilet Transfer Details (indicate cue type and reason): taking a few steps toward [facility] Toileting- Clothing Manipulation and Hygiene: Moderate assistance, +2 for physical assistance, +2 for safety/equipment, Sitting/lateral lean Functional mobility during ADLs: Minimal assistance, +2 for physical assistance, Rolling walker, Cueing for safety General ADL Comments: Pt limited by decreased strength, decreased activity tolerance, increased pain and decreased ability to care for self.     Mobility   Overal bed mobility: Needs Assistance Bed Mobility: Supine to Sit, Sit to Supine Supine to sit: Min assist, HOB elevated Sit to supine: Min assist, HOB elevated General bed mobility comments: Required assist for L LE with transfers.  Required increased time and use of bed rails     Transfers   Overall transfer level: Needs assistance Equipment used:  Rolling walker (2 wheeled) Transfer via Lift Equipment: Stedy Transfers: Sit to/from Stand Sit to Stand: Mod assist (from normal height surface) Stand pivot transfers: Min assist, +2 physical assistance, +2 safety/equipment, From elevated surface General transfer comment: Performed sit to stand x 2 from normal height; required mod A to power up and steady when moving hands to walker     Ambulation / Gait / Stairs / Wheelchair Mobility   Ambulation/Gait Ambulation/Gait assistance: Editor, commissioningMin assist Gait Distance (Feet): 15 Feet (2' then 15') Assistive device: Rolling walker (2 wheeled) Gait Pattern/deviations: Step-to pattern, Decreased stride length, Steppage, Decreased dorsiflexion - right, Decreased dorsiflexion - left General Gait Details: Required cues for RW adn posture; required assist with RW for turns and in tight areas.  Pt with foot drop/steppage gait bilaterally - reports this is new for L side. Gait velocity: reduced Gait velocity interpretation: <1.31 ft/sec, indicative of household ambulator     Posture / Balance Dynamic Sitting Balance Sitting balance - Comments: pain decreases when sitting EOB Balance Overall balance assessment: Needs assistance Sitting-balance support: Feet supported, No upper extremity supported Sitting balance-Leahy Scale: Fair Sitting balance - Comments: pain decreases when sitting EOB Standing balance support: Bilateral upper extremity supported Standing balance-Leahy  Scale: Poor Standing balance comment: required RW     Special needs/care consideration Designated visitor is spouse, [name]    Previous Home Environment  Living Arrangements: Spouse/significant other  Lives With: Spouse Available Help at Discharge:  (spouse works from home) Type of Home: House Home Layout:  (sleeps in recliner downstairs for years due to back pain) Alternate Level Stairs-Rails: Left Alternate Level Stairs-Number of Steps: ~12 Home Access: Stairs to enter Entrance  Stairs-Rails: Right Entrance Stairs-Number of Steps: 5 on side. 4 in front Bathroom Shower/Tub: Tub/shower unit, Health visitor: Handicapped height Bathroom Accessibility: Yes Home Care Services: Yes Type of Home Care Services: Home PT Home Care Agency (if known): [facility]   Discharge Living Setting Plans for Discharge Living Setting: Patient's home, Lives with (comment) (spouse) Type of Home at Discharge: House Discharge Home Layout: Able to live on main level with bedroom/bathroom, Two level Alternate Level Stairs-Rails: Left Alternate Level Stairs-Number of Steps: 12 Discharge Home Access: Stairs to enter Entrance Stairs-Rails: Right Entrance Stairs-Number of Steps: 4 to 5 Discharge Bathroom Shower/Tub: Tub/shower unit Discharge Bathroom Toilet: Handicapped height Discharge Bathroom Accessibility: Yes How Accessible: Accessible via walker Does the patient have any problems obtaining your medications?: No   Social/Family/Support Systems Patient Roles: Spouse, Parent Contact Information: spouse, [name] Anticipated Caregiver: spouse Anticipated Caregiver's Contact Information: [phone] Ability/Limitations of Caregiver: spouse works from home Caregiver Availability: 24/7 Discharge Plan Discussed with Primary Caregiver: Yes Is Caregiver In Agreement with Plan?: Yes Does Caregiver/Family have Issues with Lodging/Transportation while Pt is in Rehab?: No   Goals Patient/Family Goal for Rehab: supervision with PT and OT Expected length of stay: ELOS 14-17 days. Additional Information: will recieve monthly IVIG as an outpatient Pt/Family Agrees to Admission and willing to participate: Yes Program Orientation Provided & Reviewed with Pt/Caregiver Including Roles  & Responsibilities: Yes Additional Information Needs: Has been a patietn of [name] since 2003 for pain management   Decrease burden of Care through IP rehab admission: n/a   Possible need for SNF  placement upon discharge:not anticipated   Patient Condition: This patient's condition remains as documented in the consult dated 03/02/2020, in which the Rehabilitation Physician determined and documented that the patient's condition is appropriate for intensive rehabilitative care in an inpatient rehabilitation facility. These areas have been addressed.  Will admit to inpatient rehab today.   Preadmission Screen Completed By:  Clois Dupes, RN, 03/03/2020 11:59 AM ______________________________________________________________________   Discussed status with Dr. Allena Katz on 03/03/2020 at 1205 and received approval for admission today.   Admission Coordinator:  Clois Dupes, time 4287 Date 03/03/2020         Revision History                          Note Details  Author Allena Katz, Maryln Gottron, MD File Time 03/03/2020 12:13 PM  Author Type Physician Status Addendum  Last Editor Marcello Fennel, MD Service Physical Medicine and Rehabilitation

## 2020-03-03 NOTE — Progress Notes (Signed)
Report given to Vernona Rieger, RN. Pt transfer to CIR bed 25.

## 2020-03-03 NOTE — PMR Pre-admission (Addendum)
PMR Admission Coordinator Pre-Admission Assessment  Patient: Monica [name] is an 69 y.o., female MRN: [id number] DOB: Apr 18, 1951 Height: 5\' 4"  (162.6 cm) Weight: 60 kg              Insurance Information HMO:     PPO:      PCP:      IPA:      80/20:      OTHER:  PRIMARY: Medicare a and b      Policy#: [id number]      Subscriber: pt Benefits:  Phone #: passport one online     Name: 7/12 Eff. Date: a 07/13/2003 and b 06/12/2005     Deduct: $1484      Out of Pocket Max: none      Life Max: none  CIR: 100%      SNF: 20 full days Outpatient: 80%     Co-Pay: 205 Home Health: 100%      Co-Pay: none DME: 80%     Co-Pay: 20% Providers: pt choice  SECONDARY: A[employer]      Policy#: [id number]: [id number]      Phone#: pt  Financial Counselor:       Phone#:   The "Data Collection Information Summary" for patients in Inpatient Rehabilitation Facilities with attached "Privacy Act Statement-Health Care Records" was provided and verbally reviewed with: Patient  Emergency Contact Information Contact Information     Name Relation Home Work Mobile   Monica [name] Spouse [phone]      Current Medical History  Patient Admitting Diagnosis: inflammatory polyneuropathy  History of Present Illness: 69 y.o. female with history of uncontrolled T2DM with polyneuropathy, cervicalgia, chronic HA, chronic pain- on Methadone/Nucynta (Dr. Riley KillSwartz), constipation  (goes once a week), gait disorder with multiple falls. 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 She was admitted via Neurology office on 02/23/20 due to increase in weakness and pain for the past 2-3 weeks with numbness/tingling in feel and AKI. She was started on IVF and empirically treated with IVIG with improvement  in weakness as well as sensation.   She underwent right sural nerve and superficial quadriceps muscle biopsy by Dr. Yetta BarreJones on 07/19--> path pending.    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  Abdominal ultrasound done due to abnormal LFTs and showed increased echogenicity suggestive or underlying liver disease and/or steatosis. Hgb A1c- 10.3 and blood sugars remain labile. Pain control improving --has not used IV morphine X 24 hours. use. Has been refusing laxative to manage constipation.  Therapy ongoing and patient now showing improvement in activity tolerance. She has had functional decline in the past month and CIR recommended for follow up therapy.   Past Medical History  Past Medical History:  Diagnosis Date   Arthritis    Cervical facet joint syndrome 04/22/2012   Cervicalgia    Chronic headaches    Dr Hermelinda MedicusSchwartz [place] [facility]   Chronic nausea    normal GES    Complication of anesthesia    had problem with local anesthesia-tried to assist Physician   DM (diabetes mellitus) (HCC)    Elevated liver enzymes    hx of, normal 03/2010   GERD (gastroesophageal reflux disease) 05/31/2011   History Schatzki's ring, erosive reflux esophagitis, status post EGD and dilation 8/11 and 11/20  Glaucoma    lens in both eyes   Hemorrhoids 11/17/08   Colonoscopy Dr Karilyn Cota   Occipital neuralgia    PTSD (post-traumatic stress disorder)    Renal lithiasis    Seizure (HCC)    12 yrs ago-med related   Torticollis    Unspecified musculoskeletal disorders and symptoms referable to neck    cervical/trapezius    Family History  family history includes Colon cancer (age of onset: 53) in her mother; Diabetes in her father and paternal grandmother; Diverticulitis in her daughter; Heart disease in her father and paternal grandfather.  Prior Rehab/Hospitalizations:  Has the patient had prior rehab or hospitalizations prior to admission?  Yes  Has the patient had major surgery during 100 days prior to admission? No  Current Medications   Current Facility-Administered Medications:    0.9 %  sodium chloride infusion, 250 mL, Intravenous, Continuous, Tia Alert, MD, Last Rate: 1 mL/hr at 02/28/20 1846, Rate Verify at 02/28/20 1846   0.9 % NaCl with KCl 20 mEq/ L  infusion, , Intravenous, Continuous, Tia Alert, MD, Last Rate: 75 mL/hr at 02/28/20 1853, New Bag at 02/28/20 1853   acetaminophen (TYLENOL) tablet 650 mg, 650 mg, Oral, Q4H PRN **OR** acetaminophen (TYLENOL) suppository 650 mg, 650 mg, Rectal, Q4H PRN, Tia Alert, MD   atorvastatin (LIPITOR) tablet 10 mg, 10 mg, Oral, Daily, Edsel Petrin, DO, 10 mg at 03/03/20 1034   bisacodyl (DULCOLAX) EC tablet 5 mg, 5 mg, Oral, Daily PRN, Tia Alert, MD, 5 mg at 03/02/20 3710   bisacodyl (DULCOLAX) suppository 10 mg, 10 mg, Rectal, Daily PRN, Edsel Petrin, DO   diphenhydrAMINE-zinc acetate (BENADRYL) 2-0.1 % cream, , Topical, TID PRN, Tia Alert, MD, 1 application at 02/28/20 0215   enoxaparin (LOVENOX) injection 40 mg, 40 mg, Subcutaneous, Q24H, Tia Alert, MD, 40 mg at 03/02/20 1949   feeding supplement (ENSURE ENLIVE) (ENSURE ENLIVE) liquid 237 mL, 237 mL, Oral, TID BM, Tia Alert, MD, 237 mL at 03/02/20 1948   insulin aspart (novoLOG) injection 0-15 Units, 0-15 Units, Subcutaneous, TID AC & HS, Edsel Petrin, DO, 3 Units at 03/03/20 0740   insulin aspart (novoLOG) injection 5 Units, 5 Units, Subcutaneous, TID WC, Mikhail, [place], DO, 5 Units at 03/03/20 0741   insulin detemir (LEVEMIR) injection 5 Units, 5 Units, Subcutaneous, BID, Marguerita Merles [place], DO, 5 Units at 03/03/20 1042   magnesium oxide (MAG-OX) tablet 400 mg, 400 mg, Oral, BID, Tia Alert, MD, 400 mg at 03/03/20 1033   menthol-cetylpyridinium (CEPACOL) lozenge 3 mg, 1 lozenge, Oral, PRN **OR** phenol (CHLORASEPTIC) mouth spray 1 spray, 1 spray, Mouth/Throat, PRN, Tia Alert, MD   methadone (DOLOPHINE) tablet 20 mg, 20 mg, Oral, TID, Tia Alert, MD, 20 mg at 03/03/20 1127   morphine 2 MG/ML injection 1 mg, 1 mg, Intravenous, Q3H PRN, Marland Mcalpine, Omair Latif, DO, 1 mg at 03/01/20 0421   nortriptyline (PAMELOR) capsule 25 mg, 25 mg, Oral, QHS, Tia Alert, MD, 25 mg at 03/02/20 2155   ondansetron (ZOFRAN) tablet 4 mg, 4 mg, Oral, Q6H PRN **OR** ondansetron (ZOFRAN) injection 4 mg, 4 mg, Intravenous, Q6H PRN, Tia Alert, MD, 4 mg at 02/29/20 6269   pneumococcal 23 valent vaccine (PNEUMOVAX-23) injection 0.5 mL, 0.5 mL, Intramuscular, Tomorrow-1000, Sheikh, Omair Latif, DO   polyethylene glycol (MIRALAX / GLYCOLAX) packet 17 g, 17 g, Oral, BID, Tia Alert, MD, 17 g at 02/29/20 2151   senna (SENOKOT)  tablet 8.6 mg, 1 tablet, Oral, BID, Tia Alert, MD, 8.6 mg at 03/01/20 2132   sodium chloride flush (NS) 0.9 % injection 3 mL, 3 mL, Intravenous, Q12H, Tia Alert, MD, 3 mL at 03/03/20 1040   sodium chloride flush (NS) 0.9 % injection 3 mL, 3 mL, Intravenous, PRN, Tia Alert, MD   tapentadol (NUCYNTA) tablet 50 mg, 50 mg, Oral, Q12H PRN, Tia Alert, MD, 50 mg at 02/29/20 0145   tiZANidine (ZANAFLEX) tablet 2 mg, 2 mg, Oral, Q6H PRN, Tia Alert, MD, 2 mg at 03/03/20 1038   topiramate (TOPAMAX) tablet 100 mg, 100 mg, Oral, Daily, Tia Alert, MD, 100 mg at 03/03/20 1034   topiramate (TOPAMAX) tablet 200 mg, 200 mg, Oral, QHS, Tia Alert, MD, 200 mg at 03/02/20 2154  Patients Current Diet:  Diet Order             Diet heart healthy/carb modified Room service appropriate? Yes; Fluid consistency: Thin  Diet effective now                   Precautions / Restrictions Precautions Precautions: Fall Precaution Comments: weak and unsteady RLE Restrictions Weight Bearing Restrictions: No Other Position/Activity Restrictions: no braces have been obtained   Has the patient had 2 or more falls or a fall with injury in the past year?Yes  13 times since January per patient  Prior Activity Level Limited Community (1-2x/wk): gradual decline in function over last several months  Prior Functional Level Prior Function Level of Independence: Needs assistance Gait / Transfers Assistance Needed: still managing stairs >3 weeks ago with [facility]; assisted with transfers ADL's / Homemaking Assistance Needed: husband assists with bathing and dressing. Pt stands in shower- independent. Husband completes all iADLs. Comments: Plans to use downnstairs walk in shower  Self Care: Did the patient need help bathing, dressing, using the toilet or eating?  Needed some help  Indoor Mobility: Did the patient need assistance with walking from room to room (with or without device)? Needed some help  Stairs: Did the patient need assistance with internal or external stairs (with or without device)? Needed some help  Functional Cognition: Did the patient need help planning regular tasks such as shopping or remembering to take medications? Independent  Home Assistive Devices / Equipment Home Assistive Devices/Equipment: Environmental consultant (specify type), Wheelchair Home Equipment: Walker - 2 wheels, Cane - single point  Prior Device Use: Indicate devices/aids used by the patient prior to current illness, exacerbation or injury? Walker  Current Functional Level Cognition  Overall Cognitive Status: Within Functional Limits for tasks assessed Orientation Level: Oriented X4 General Comments: motivated to get up to [facility]    Extremity Assessment (includes Sensation/Coordination)  Upper Extremity Assessment: Generalized weakness  Lower Extremity Assessment: Generalized weakness, Defer to PT evaluation RLE Deficits / Details: weakness noted, decreased ROM RLE Coordination: decreased fine motor, decreased gross motor LLE Coordination: decreased gross motor    ADLs  Overall ADL's : Needs assistance/impaired Eating/Feeding: Set up, Sitting, Bed level Grooming: Set  up, Sitting, Bed level Upper Body Bathing: Minimal assistance, Sitting, Bed level Lower Body Bathing: Moderate assistance, Cueing for safety, Sitting/lateral leans, Sit to/from stand Upper Body Dressing : Minimal assistance, Sitting, Bed level Lower Body Dressing: Moderate assistance, Sitting/lateral leans, Bed level Toilet Transfer: Minimal assistance, +2 for physical assistance, +2 for safety/equipment, RW, Stand-pivot Toilet Transfer Details (indicate cue type and reason): taking a few steps toward [facility] Toileting- Clothing Manipulation and Hygiene: Moderate  assistance, +2 for physical assistance, +2 for safety/equipment, Sitting/lateral lean Functional mobility during ADLs: Minimal assistance, +2 for physical assistance, Rolling walker, Cueing for safety General ADL Comments: Pt limited by decreased strength, decreased activity tolerance, increased pain and decreased ability to care for self.    Mobility  Overal bed mobility: Needs Assistance Bed Mobility: Supine to Sit, Sit to Supine Supine to sit: Min assist, HOB elevated Sit to supine: Min assist, HOB elevated General bed mobility comments: Required assist for L LE with transfers.  Required increased time and use of bed rails    Transfers  Overall transfer level: Needs assistance Equipment used: Rolling walker (2 wheeled) Transfer via Lift Equipment: Stedy Transfers: Sit to/from Stand Sit to Stand: Mod assist (from normal height surface) Stand pivot transfers: Min assist, +2 physical assistance, +2 safety/equipment, From elevated surface General transfer comment: Performed sit to stand x 2 from normal height; required mod A to power up and steady when moving hands to walker    Ambulation / Gait / Stairs / Wheelchair Mobility  Ambulation/Gait Ambulation/Gait assistance: Editor, commissioning (Feet): 15 Feet (2' then 15') Assistive device: Rolling walker (2 wheeled) Gait Pattern/deviations: Step-to pattern, Decreased stride  length, Steppage, Decreased dorsiflexion - right, Decreased dorsiflexion - left General Gait Details: Required cues for RW adn posture; required assist with RW for turns and in tight areas.  Pt with foot drop/steppage gait bilaterally - reports this is new for L side. Gait velocity: reduced Gait velocity interpretation: <1.31 ft/sec, indicative of household ambulator    Posture / Balance Dynamic Sitting Balance Sitting balance - Comments: pain decreases when sitting EOB Balance Overall balance assessment: Needs assistance Sitting-balance support: Feet supported, No upper extremity supported Sitting balance-Leahy Scale: Fair Sitting balance - Comments: pain decreases when sitting EOB Standing balance support: Bilateral upper extremity supported Standing balance-Leahy Scale: Poor Standing balance comment: required RW    Special needs/care consideration Designated visitor is spouse, [name]   Previous Home Environment  Living Arrangements: Spouse/significant other  Lives With: Spouse Available Help at Discharge:  (spouse works from home) Type of Home: House Home Layout:  (sleeps in recliner downstairs for years due to back pain) Alternate Level Stairs-Rails: Left Alternate Level Stairs-Number of Steps: ~12 Home Access: Stairs to enter Entrance Stairs-Rails: Right Entrance Stairs-Number of Steps: 5 on side. 4 in front Bathroom Shower/Tub: Tub/shower unit, Health visitor: Handicapped height Bathroom Accessibility: Yes Home Care Services: Yes Type of Home Care Services: Home PT Home Care Agency (if known): [facility]  Discharge Living Setting Plans for Discharge Living Setting: Patient's home, Lives with (comment) (spouse) Type of Home at Discharge: House Discharge Home Layout: Able to live on main level with bedroom/bathroom, Two level Alternate Level Stairs-Rails: Left Alternate Level Stairs-Number of Steps: 12 Discharge Home Access: Stairs to enter Entrance  Stairs-Rails: Right Entrance Stairs-Number of Steps: 4 to 5 Discharge Bathroom Shower/Tub: Tub/shower unit Discharge Bathroom Toilet: Handicapped height Discharge Bathroom Accessibility: Yes How Accessible: Accessible via walker Does the patient have any problems obtaining your medications?: No  Social/Family/Support Systems Patient Roles: Spouse, Parent Contact Information: spouse, [name] Anticipated Caregiver: spouse Anticipated Caregiver's Contact Information: [phone] Ability/Limitations of Caregiver: spouse works from home Caregiver Availability: 24/7 Discharge Plan Discussed with Primary Caregiver: Yes Is Caregiver In Agreement with Plan?: Yes Does Caregiver/Family have Issues with Lodging/Transportation while Pt is in Rehab?: No  Goals Patient/Family Goal for Rehab: supervision with PT and OT Expected length of stay: ELOS 14-17 days. Additional Information: will recieve  monthly IVIG as an outpatient Pt/Family Agrees to Admission and willing to participate: Yes Program Orientation Provided & Reviewed with Pt/Caregiver Including Roles  & Responsibilities: Yes Additional Information Needs: Has been a patietn of [name] since 2003 for pain management  Decrease burden of Care through IP rehab admission: n/a  Possible need for SNF placement upon discharge:not anticipated  Patient Condition: This patient's condition remains as documented in the consult dated 03/02/2020, in which the Rehabilitation Physician determined and documented that the patient's condition is appropriate for intensive rehabilitative care in an inpatient rehabilitation facility. These areas have been addressed.  Will admit to inpatient rehab today.  Preadmission Screen Completed By:  Clois Dupes, RN, 03/03/2020 11:59 AM ______________________________________________________________________   Discussed status with Dr. Allena Katz on 03/03/2020 at 1205 and received approval for admission  today.  Admission Coordinator:  Clois Dupes, time 1610 Date 03/03/2020

## 2020-03-03 NOTE — Progress Notes (Signed)
Physical Therapy Treatment Patient Details Name: Monica [name] MRN: [id number] DOB: May 13, 1951 Today's Date: 03/03/2020    History of Present Illness Monica [name] is a 69 y.o. female s/p concerns of worsening lower leg pain and inability to walk. 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  She is now s/p R sural nerve and R superficial quadriceps muscle biopsies on 02/28/20. Pt with medical history significant for chronic migraine, type 2 diabetes, chronic pain on methadone, PTSD    PT Comments    Pt in bed upon arrival of PT, agreeable this afternoon after reduction in pain from this morning. The pt declined OOB mobility at this point due to continued reports of pain, but was agreeable to bed-level exercises. The pt was able to demo good activation and movement of her LLE (other than LLE DF), even against resistance, but requires minA to move through full range of exercises with her RLE. The pt was able to complete a series of UE strengthening exercises against resistance without assist from the therapist. The pt will continue to benefit from intensive therapies to return to prior level of function.     Follow Up Recommendations  CIR     Equipment Recommendations   (has DME)    Recommendations for Other Services       Precautions / Restrictions Precautions Precautions: Fall Precaution Comments: weak and unsteady RLE Restrictions Weight Bearing Restrictions: No    Mobility  Bed Mobility Overal bed mobility: Needs Assistance (pt deferred OOB mobility at this time due to reports of pain initially but later told RN it was because she was fearful of falling. Agreeable to bed-level exercises)                   Balance Overall balance assessment: Needs assistance                                          Cognition Arousal/Alertness: Awake/alert Behavior During Therapy: WFL for tasks assessed/performed Overall  Cognitive Status: Within Functional Limits for tasks assessed                                        Exercises General Exercises - Upper Extremity Shoulder Horizontal ADduction: Strengthening;Both;5 reps;Seated (orange theraband) Elbow Extension: Strengthening;Both;10 reps;Seated (orange theraband) General Exercises - Lower Extremity Ankle Circles/Pumps: 10 reps;AAROM;Both;Supine (LLE with no active DF, PROM but painful to the touch) Quad Sets: AROM;Both;10 reps;Supine (trace activation RLE) Heel Slides: AAROM;Both;10 reps;Supine Hip ABduction/ADduction: AAROM;Both;10 reps;Supine    General Comments General comments (skin integrity, edema, etc.): Pt heading to rehab later roday, declining OOB mobility at this time due to reports of pain and muscle spasms.      Pertinent Vitals/Pain Pain Assessment: Faces Faces Pain Scale: Hurts even more Pain Location: R leg to the touch, grimacing with initial movement of BLE Pain Descriptors / Indicators: Cramping;Spasm Pain Intervention(s): Limited activity within patient's tolerance;Monitored during session;Repositioned    Home Living     Available Help at Discharge:  (spouse works from home)       Home Layout:  (sleeps in recliner downstairs for years due to back pain)        Prior Function            PT Goals (current goals can  now be found in the care plan section) Acute Rehab PT Goals Patient Stated Goal: to go to rehab and then to return home with spouse and dogs PT Goal Formulation: With patient/family Time For Goal Achievement: 03/09/20 Progress towards PT goals: Progressing toward goals    Frequency    Min 3X/week      PT Plan Current plan remains appropriate       AM-PAC PT "6 Clicks" Mobility   Outcome Measure  Help needed turning from your back to your side while in a flat bed without using bedrails?: A Little Help needed moving from lying on your back to sitting on the side of a flat bed  without using bedrails?: A Little Help needed moving to and from a bed to a chair (including a wheelchair)?: A Little Help needed standing up from a chair using your arms (e.g., wheelchair or bedside chair)?: A Little Help needed to walk in hospital room?: A Little Help needed climbing 3-5 steps with a railing? : A Lot 6 Click Score: 17    End of Session   Activity Tolerance: Patient tolerated treatment well Patient left: in bed;with call bell/phone within reach;with family/visitor present;with bed alarm set Nurse Communication: Mobility status PT Visit Diagnosis: Unsteadiness on feet (R26.81);Muscle weakness (generalized) (M62.81);Difficulty in walking, not elsewhere classified (R26.2)     Time: 7673-4193 PT Time Calculation (min) (ACUTE ONLY): 26 min  Charges:  $Therapeutic Exercise: 23-37 mins                     Rolm Baptise, PT, DPT   Acute Rehabilitation Department Pager #: [phone]   [name] 03/03/2020, 1:55 PM

## 2020-03-03 NOTE — H&P (Addendum)
Physical Medicine and Rehabilitation Admission H&P    Chief Complaint  Patient presents with  . Functional deficits    HPI: Monica [name] is a 69 y.o female with history of uncontrolled T2D with polyneuropathy, cervicalgia, chronic headaches, chronic pain--on methadone/Nucynta per Dr. Tessa Lerner, constipation, gait disorder with multiple falls and has had progressive weakness with inability to walk in the last 4 weeks.  History taken from chart review and patient.  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  She was started on steroids while awaiting approval for IVIG, however due to 2 issues with progressive weakness as well as severe pain and neuropathy RLE greater than LLE as well as AKI she was admitted for management.  She was started on IV fluids and empirically treated with IVIG with with improvement in weakness and sensation.  Neurology following for work-up and recommended muscle biopsy.  She underwent right sural nerve and superficial quadriceps muscle biopsy by Dr. Ronnald Ramp on 07/19--pathology still pending.  Dysesthesias RLE had greatly improved however she continues to be limited by weakness with impairments in mobility and ADLs.  She is showing improvement in activity tolerance and CIR was recommended for follow-up therapy.  Please see preadmission assessment from earlier today as well.  Review of Systems  Constitutional: Positive for malaise/fatigue. Negative for chills and fever.  HENT: Negative for hearing loss and tinnitus.   Eyes: Negative for blurred vision and double vision.  Respiratory: Negative for cough and shortness of breath.   Cardiovascular: Negative for chest pain and palpitations.  Gastrointestinal: Negative for constipation, heartburn and nausea.       Normal BM is once  a week'- "forever"  Musculoskeletal: Positive for back pain, joint pain (right groin since EMG), myalgias and neck pain.  Skin: Negative for rash.  Neurological: Positive for sensory change, focal weakness and weakness.  Psychiatric/Behavioral: Hallucinations: chronic--does not sleep night X years. The patient has insomnia.   All other systems reviewed and are negative.    Past Medical History:  Diagnosis Date  . Arthritis   . Cervical facet joint syndrome 04/22/2012  . Cervicalgia   . Chronic headaches    Dr Tessa Lerner [place] [facility]  . Chronic nausea    normal GES   . Complication of anesthesia    had problem with local anesthesia-tried to assist Physician  . DM (diabetes mellitus) ([place])   . Elevated liver enzymes    hx of, normal 03/2010  . GERD (gastroesophageal reflux disease) 05/31/2011   History Schatzki's ring, erosive reflux esophagitis, status post EGD and dilation 8/11 and 11/20  . Glaucoma    lens in both eyes  . Hemorrhoids 11/17/08   Colonoscopy Dr Laural Golden  . Occipital neuralgia   . PTSD (post-traumatic stress disorder)   . Renal lithiasis   . Seizure ([place])    12 yrs ago-med related  . Torticollis   . Unspecified musculoskeletal disorders and symptoms referable to neck    cervical/trapezius    Past Surgical History:  Procedure Laterality Date  . BREAST LUMPECTOMY     left  . CATARACT EXTRACTION W/PHACO Left 10/07/2016   Procedure: CATARACT EXTRACTION PHACO AND INTRAOCULAR LENS PLACEMENT (IOC);  Surgeon: Tonny Branch, MD;  Location: [facility];  Service: Ophthalmology;  Laterality: Left;  CDE: 4.98  . CATARACT EXTRACTION W/PHACO Right 10/21/2016   Procedure: CATARACT EXTRACTION PHACO AND INTRAOCULAR  LENS PLACEMENT RIGHT EYE CDE - 6.24;  Surgeon: Tonny Branch, MD;  Location: [facility];  Service: Ophthalmology;  Laterality: Right;  right  . CHOLECYSTECTOMY    . COLONOSCOPY  11/17/08   ext hemorrhoids  . CRANIOTOMY     secondary TBI  . CYST EXCISION     left wrist  .  ESOPHAGOGASTRODUODENOSCOPY  04/05/10   [name] (50F),erosive reflux esophagitis, small hiatal hernia,  . ESOPHAGOGASTRODUODENOSCOPY (EGD) WITH PROPOFOL N/A 08/30/2019   Procedure: ESOPHAGOGASTRODUODENOSCOPY (EGD) WITH PROPOFOL;  Surgeon: Daneil Dolin, MD;  Location: [facility];  Service: Endoscopy;  Laterality: N/A;  3:30pm - pt knows to arrive at 9:45  . EXTRACORPOREAL SHOCK WAVE LITHOTRIPSY Left 12/13/2019   Procedure: EXTRACORPOREAL SHOCK WAVE LITHOTRIPSY (ESWL);  Surgeon: Festus Aloe, MD;  Location: [facility];  Service: Urology;  Laterality: Left;  . FINGER SURGERY     removal cyst left pinky  . gastro empy study  03/03/10   normal  . HAND SURGERY    . MALONEY DILATION N/A 08/30/2019   Procedure: Venia Minks DILATION;  Surgeon: Daneil Dolin, MD;  Location: [facility];  Service: Endoscopy;  Laterality: N/A;  . NECK SURGERY  1998  . NEPHROLITHOTOMY  05/13/2012   Procedure: NEPHROLITHOTOMY PERCUTANEOUS;  Surgeon: Franchot Gallo, MD;  Location: [facility];  Service: Urology;  Laterality: Left;     . SURAL NERVE BX Right 02/28/2020   Procedure: SURAL NERVE / Quadricep BIOPSY;  Surgeon: Eustace Moore, MD;  Location: [facility];  Service: Neurosurgery;  Laterality: Right;    Family History  Problem Relation Age of Onset  . Colon cancer Mother 92  . Heart disease Father   . Diabetes Father   . Diabetes Paternal Grandmother   . Heart disease Paternal Grandfather   . Diverticulitis Daughter     Social History:  Married. Was independent with walker till 4 weeks PTA--has been using a walker since. She reports that she has never smoked. She has never used smokeless tobacco. She reports that she does not drink alcohol and does not use drugs.    Allergies  Allergen Reactions  . Aspirin Anaphylaxis and Hives    Slurred speech, blurred vision- also  . Hydromorphone Hcl Shortness Of Breath and Other (See Comments)    Severe shortness of breath  . Vimpat  [Lacosamide] Other (See Comments)    Hallucinations, out of body experience, confusion   . Depakote [Divalproex Sodium] Other (See Comments)    Reaction not recalled by husband  . Milnacipran Hcl Other (See Comments)    Dizziness   . Pristiq [Desvenlafaxine Succinate Monohydrate] Other (See Comments)    Dizziness   . Capsaicin Rash and Other (See Comments)    Red rash  . Gabapentin Other (See Comments)    States leg pain worsened, pt d/c'd   Medications Prior to Admission  Medication Sig Dispense Refill  . bisacodyl (BISACODYL) 5 MG EC tablet Take 5 mg by mouth daily as needed for mild constipation or moderate constipation.     . dapagliflozin propanediol (FARXIGA) 10 MG TABS tablet Take 1 tablet (10 mg total) by mouth daily before breakfast. 30 tablet 2  . magnesium oxide (MAG-OX) 400 (241.3 Mg) MG tablet Take 1 tablet (400 mg total) by mouth 2 (two) times daily. 60 tablet 2  . metFORMIN (GLUCOPHAGE) 500 MG tablet Take 2 tablets (1,000 mg total) by mouth 2 (two) times daily. 360 tablet 1  . methadone (DOLOPHINE) 10 MG tablet  Take 2 tablets (20 mg total) by mouth 3 (three) times daily. 180 tablet 0  . nortriptyline (PAMELOR) 25 MG capsule TAKE 1 CAPSULE BY MOUTH AT BEDTIME. (Patient taking differently: Take 25 mg by mouth at bedtime. ) 30 capsule 0  . ondansetron (ZOFRAN) 4 MG tablet Take 1 tablet (4 mg total) by mouth every 8 (eight) hours as needed for nausea or vomiting. 4 tablet 0  . tapentadol (NUCYNTA) 50 MG tablet Take 1 tablet (50 mg total) by mouth every 12 (twelve) hours as needed. (Patient taking differently: Take 50 mg by mouth every 12 (twelve) hours as needed (for breakthrough migraines). ) 60 tablet 0  . tiZANidine (ZANAFLEX) 2 MG tablet TAKE (1) TABLET EVERY SIX HOURS AS NEEDED FOR MUSCLE SPASMS. (Patient taking differently: Take 2 mg by mouth every 6 (six) hours as needed for muscle spasms. ) 60 tablet 1  . topiramate (TOPAMAX) 50 MG tablet TAKE 2 TABLETS IN THE MORNING AND  4 TABLETS AT BEDTIME. (Patient taking differently: Take 100-200 mg by mouth See admin instructions. Take 100 mg by mouth in the morning and 200 mg at bedtime) 180 tablet 0  . ALPRAZolam (XANAX) 0.25 MG tablet Take 1-2 tabs (0.84m-0.50mg) 30-60 minutes before procedure. May repeat if needed.Do not drive. (Patient not taking: Reported on 02/23/2020) 4 tablet 0  . atorvastatin (LIPITOR) 10 MG tablet Take 1 tablet (10 mg total) by mouth daily. (Patient not taking: Reported on 02/23/2020) 30 tablet 2  . blood glucose meter kit and supplies KIT Dispense based on patient and insurance preference. Use up to four times daily as directed. (FOR ICD-9 250.00, 250.01). 1 each 0  . predniSONE (DELTASONE) 20 MG tablet Take 3 tablets (60 mg total) by mouth daily with breakfast. First dose may be taken at anytime (Patient not taking: Reported on 02/23/2020) 15 tablet 0    Drug Regimen Review  Drug regimen was reviewed and remains appropriate with no significant issues identified  Home: Home Living Family/patient expects to be discharged to:: Private residence Living Arrangements: Spouse/significant other Available Help at Discharge:  (spouse works from home) [place] of Home: [place] Stairs to enter ECenterPoint Energyof Steps: 5 on side. 4 in front Entrance Stairs-Rails: Right Home Layout:  (sleeps in recliner downstairs for years due to back pain) Alternate Level Stairs-Number of Steps: ~12 Alternate Level Stairs-Rails: Left Bathroom Shower/Tub: Tub/shower unit, WMultimedia programmer Handicapped height Bathroom Accessibility: Yes Home Equipment: WEnvironmental consultant- 2 wheels, Cane - single point  Lives With: Spouse   Functional History: Prior Function Level of Independence: Needs assistance Gait / Transfers Assistance Needed: still managing stairs [facility] ADL's / Homemaking Assistance Needed: husband assists with bathing and dressing. Pt stands in  shower- independent. Husband completes all iADLs. Comments: Plans to use downnstairs walk in shower  Functional Status:  Mobility: Bed Mobility Overal bed mobility: Needs Assistance Bed Mobility: Supine to Sit, Sit to Supine Supine to sit: Min assist, HOB elevated Sit to supine: Min assist, HOB elevated General bed mobility comments: Required assist for L LE with transfers.  Required increased time and use of bed rails Transfers Overall transfer level: Needs assistance Equipment used: Rolling walker (2 wheeled) Transfer via Lift Equipment: Stedy Transfers: Sit to/from Stand Sit to Stand: Mod assist (from normal height surface) Stand pivot transfers: Min assist, +2 physical assistance, +2 safety/equipment, From elevated surface General transfer comment: Performed sit to stand x 2 from normal height; required  mod A to power up and steady when moving hands to walker Ambulation/Gait Ambulation/Gait assistance: Min assist Gait Distance (Feet): 15 Feet (2' then 15') Assistive device: Rolling walker (2 wheeled) Gait Pattern/deviations: Step-to pattern, Decreased stride length, Steppage, Decreased dorsiflexion - right, Decreased dorsiflexion - left General Gait Details: Required cues for RW adn posture; required assist with RW for turns and in tight areas.  Pt with foot drop/steppage gait bilaterally - reports this is new for L side. Gait velocity: reduced Gait velocity interpretation: <1.31 ft/sec, indicative of household ambulator    ADL: ADL Overall ADL's : Needs assistance/impaired Eating/Feeding: Set up, Sitting, Bed level Grooming: Set up, Sitting, Bed level Upper Body Bathing: Minimal assistance, Sitting, Bed level Lower Body Bathing: Moderate assistance, Cueing for safety, Sitting/lateral leans, Sit to/from stand Upper Body Dressing : Minimal assistance, Sitting, Bed level Lower Body Dressing: Moderate assistance, Sitting/lateral leans, Bed level Toilet Transfer: Minimal  assistance, +2 for physical assistance, +2 for safety/equipment, RW, Stand-pivot Toilet Transfer Details (indicate cue type and reason): taking a few steps toward [facility] Toileting- Clothing Manipulation and Hygiene: Moderate assistance, +2 for physical assistance, +2 for safety/equipment, Sitting/lateral lean Functional mobility during ADLs: Minimal assistance, +2 for physical assistance, Rolling walker, Cueing for safety General ADL Comments: Pt limited by decreased strength, decreased activity tolerance, increased pain and decreased ability to care for self.  Cognition: Cognition Overall Cognitive Status: Within Functional Limits for tasks assessed Orientation Level: Oriented X4 Cognition Arousal/Alertness: Awake/alert Behavior During Therapy: WFL for tasks assessed/performed Overall Cognitive Status: Within Functional Limits for tasks assessed General Comments: motivated to get up to [facility]   Blood pressure (!) 128/62, pulse 91, temperature 98.8 F (37.1 C), temperature source Oral, resp. rate 18, height _0  (1.626 m), weight 60 kg, SpO2 97 %. Physical Exam Vitals and nursing note reviewed.  Constitutional:      General: She is not in acute distress.    Appearance: She is normal weight.  HENT:     Head: Normocephalic and atraumatic.     Right Ear: External ear normal.     Left Ear: External ear normal.     Nose: Nose normal.  Eyes:     General:        Right eye: No discharge.        Left eye: No discharge.     Extraocular Movements: Extraocular movements intact.  Cardiovascular:     Rate and Rhythm: Normal rate and regular rhythm.  Pulmonary:     Effort: Pulmonary effort is normal. No respiratory distress.     Breath sounds: Normal breath sounds. No stridor.  Abdominal:     General: Bowel sounds are normal. There is no distension.     Palpations: Abdomen is soft.  Musculoskeletal:     Cervical back: Normal range of motion and neck supple.     Comments: No edema or  tenderness in extremities  Skin:    General: Skin is warm and dry.  Neurological:     Mental Status: She is alert.     Comments: Alert Bilateral upper extremities: 4-4+/5 proximal distal Right lower extremity: Hip flexion, knee extension 4+/5, dorsiflexion 0/5 Left lower extremity: Hip flexion, knee flexion 0/5, wiggles toes Sensation diminished to light touch bilateral feet (right> left)  Psychiatric:        Mood and Affect: Mood normal.        Behavior: Behavior normal.     Results for orders placed or performed during the hospital encounter of 02/23/20 (  from the past 48 hour(s))  Glucose, capillary     Status: Abnormal   Collection Time: 03/01/20  4:58 PM  Result Value Ref Range   Glucose-Capillary 252 (H) 70 - 99 mg/dL    Comment: Glucose reference range applies only to samples taken after fasting for at least 8 hours.  Glucose, capillary     Status: Abnormal   Collection Time: 03/01/20  9:15 PM  Result Value Ref Range   Glucose-Capillary 335 (H) 70 - 99 mg/dL    Comment: Glucose reference range applies only to samples taken after fasting for at least 8 hours.   Comment 1 Notify RN    Comment 2 Document in Chart   Hemoglobin and hematocrit, blood     Status: Abnormal   Collection Time: 03/02/20  4:55 AM  Result Value Ref Range   Hemoglobin 10.3 (L) 12.0 - 15.0 g/dL   HCT 33.2 (L) 36 - 46 %    Comment: Performed at [facility] [address], [place], [place] [address]  Comprehensive metabolic panel     Status: Abnormal   Collection Time: 03/02/20  4:55 AM  Result Value Ref Range   Sodium 134 (L) 135 - 145 mmol/L   Potassium 4.4 3.5 - 5.1 mmol/L   Chloride 101 98 - 111 mmol/L   CO2 25 22 - 32 mmol/L   Glucose, Bld 208 (H) 70 - 99 mg/dL    Comment: Glucose reference range applies only to samples taken after fasting for at least 8 hours.   BUN 20 8 - 23 mg/dL   Creatinine, Ser 0.96 0.44 - 1.00 mg/dL   Calcium 9.2 8.9 - 10.3 mg/dL   Total Protein 7.4 6.5 - 8.1  g/dL   Albumin 2.6 (L) 3.5 - 5.0 g/dL   AST 22 15 - 41 U/L   ALT 30 0 - 44 U/L   Alkaline Phosphatase 110 38 - 126 U/L   Total Bilirubin 0.6 0.3 - 1.2 mg/dL   GFR calc non Af Amer >60 >60 mL/min   GFR calc Af Amer >60 >60 mL/min   Anion gap 8 5 - 15    Comment: Performed at [facility] [address] [place], [place] [address]  Glucose, capillary     Status: Abnormal   Collection Time: 03/02/20  6:14 AM  Result Value Ref Range   Glucose-Capillary 183 (H) 70 - 99 mg/dL    Comment: Glucose reference range applies only to samples taken after fasting for at least 8 hours.   Comment 1 Notify RN    Comment 2 Document in Chart   Glucose, capillary     Status: Abnormal   Collection Time: 03/02/20  7:38 AM  Result Value Ref Range   Glucose-Capillary 152 (H) 70 - 99 mg/dL    Comment: Glucose reference range applies only to samples taken after fasting for at least 8 hours.  Glucose, capillary     Status: Abnormal   Collection Time: 03/02/20  1:01 PM  Result Value Ref Range   Glucose-Capillary 265 (H) 70 - 99 mg/dL    Comment: Glucose reference range applies only to samples taken after fasting for at least 8 hours.  Glucose, capillary     Status: Abnormal   Collection Time: 03/02/20  4:16 PM  Result Value Ref Range   Glucose-Capillary 229 (H) 70 - 99 mg/dL    Comment: Glucose reference range applies only to samples taken after fasting for at least 8  hours.  Glucose, capillary     Status: Abnormal   Collection Time: 03/02/20  9:10 PM  Result Value Ref Range   Glucose-Capillary 240 (H) 70 - 99 mg/dL    Comment: Glucose reference range applies only to samples taken after fasting for at least 8 hours.  Glucose, capillary     Status: Abnormal   Collection Time: 03/03/20  6:15 AM  Result Value Ref Range   Glucose-Capillary 176 (H) 70 - 99 mg/dL    Comment: Glucose reference range applies only to samples taken after fasting for at least 8 hours.  Glucose, capillary     Status:  Abnormal   Collection Time: 03/03/20 10:28 AM  Result Value Ref Range   Glucose-Capillary 267 (H) 70 - 99 mg/dL    Comment: Glucose reference range applies only to samples taken after fasting for at least 8 hours.  Hemoglobin and hematocrit, blood     Status: Abnormal   Collection Time: 03/03/20 12:52 PM  Result Value Ref Range   Hemoglobin 10.7 (L) 12.0 - 15.0 g/dL   HCT 33.7 (L) 36 - 46 %    Comment: Performed at [facility] [address] [place],  [address]  Comprehensive metabolic panel     Status: Abnormal   Collection Time: 03/03/20 12:52 PM  Result Value Ref Range   Sodium 133 (L) 135 - 145 mmol/L   Potassium 4.5 3.5 - 5.1 mmol/L   Chloride 99 98 - 111 mmol/L   CO2 27 22 - 32 mmol/L   Glucose, Bld 280 (H) 70 - 99 mg/dL    Comment: Glucose reference range applies only to samples taken after fasting for at least 8 hours.   BUN 20 8 - 23 mg/dL   Creatinine, Ser 0.95 0.44 - 1.00 mg/dL   Calcium 9.3 8.9 - 10.3 mg/dL   Total Protein 7.5 6.5 - 8.1 g/dL   Albumin 2.6 (L) 3.5 - 5.0 g/dL   AST 23 15 - 41 U/L   ALT 28 0 - 44 U/L   Alkaline Phosphatase 116 38 - 126 U/L   Total Bilirubin 0.4 0.3 - 1.2 mg/dL   GFR calc non Af Amer >60 >60 mL/min   GFR calc Af Amer >60 >60 mL/min   Anion gap 7 5 - 15    Comment: Performed at [facility], [place] [address] [place], [place] [address]  Glucose, capillary     Status: Abnormal   Collection Time: 03/03/20  1:01 PM  Result Value Ref Range   Glucose-Capillary 259 (H) 70 - 99 mg/dL    Comment: Glucose reference range applies only to samples taken after fasting for at least 8 hours.   No results found.     Medical Problem List and Plan: 1. 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  -patient may shower  -ELOS/Goals: 10-14 days/supervision  Admit to CIR 2.  Antithrombotics: -DVT/anticoagulation:  Pharmaceutical: Lovenox  -antiplatelet  therapy: N/A 3. Chronic neck/back/Pain Management: Continue Methadone 20 mg tid, Pamelor 25 mg/hs and Nuycnta prn. Tizanidine tid prn for spasms.    Monitor with increased exertion 4.  Mood: LCSW to follow for evaluation ad support.   -antipsychotic agents: N/A 5. Neuropsych: This patient is capable of making decisions on her own behalf. 6. Skin/Wound Care: Routine pressure relief measures.  7. Fluids/Electrolytes/Nutrition: Monitor I/O.  CMP ordered. 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  9.T2DM-poorly  controlled: A1c- 8.6. Continue Levemir 5 units BID--increase meal coverage to 10 units tic ac  Monitor with increased mobility 10. Normocytic anemia: Stable overall. Monitor for signs of infection.   CBC ordered. 11. Low calorie malnutrition: Continue supplement 12. Hyponatremia/ chronic Hypomagnesemia: Low Na- likely due to IVIG-- recovering.  Mag ordered for Monday  13. Abnormal LFTs: Likely due to IVIG.   CMP ordered. 14. Constipation: Dulcolax prn effective.  15. Persistent daily HA: On topamax 100 mg/day.   Monitor with increased activity  Bary Leriche, PA-C 03/03/2020  I have personally performed a face to face diagnostic evaluation, including, but not limited to relevant history and physical exam findings, of this patient and developed relevant assessment and plan.  Additionally, I have reviewed and concur with the physician assistant's documentation above.  Delice Lesch, MD, ABPMR

## 2020-03-03 NOTE — Care Management Important Message (Signed)
Important Message  Patient Details  Name: Monica [name] MRN: [id number] Date of Birth: 09-06-1950   Medicare Important Message Given:  Yes Patient left  Prior to IM delivery.  IM mailed to the patients address.    Auriana Scalia 03/03/2020, 3:19 PM

## 2020-03-03 NOTE — H&P (Signed)
Physical Medicine and Rehabilitation Admission H&P    Chief Complaint  Patient presents with  . Functional deficits    HPI: Monica [name] is a 69 y.o female with history of uncontrolled T2D with polyneuropathy, cervicalgia, chronic headaches, chronic pain--on methadone/Nucynta per Dr. Tessa Lerner, constipation, gait disorder with multiple falls and has had progressive weakness with inability to walk in the last 4 weeks.  History taken from chart review and patient.  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  She was started on steroids while awaiting approval for IVIG, however due to 2 issues with progressive weakness as well as severe pain and neuropathy RLE greater than LLE as well as AKI she was admitted for management.  She was started on IV fluids and empirically treated with IVIG with with improvement in weakness and sensation.  Neurology following for work-up and recommended muscle biopsy.  She underwent right sural nerve and superficial quadriceps muscle biopsy by Dr. Ronnald Ramp on 07/19--pathology still pending.  Dysesthesias RLE had greatly improved however she continues to be limited by weakness with impairments in mobility and ADLs.  She is showing improvement in activity tolerance and CIR was recommended for follow-up therapy.  Please see preadmission assessment from earlier today as well.  Review of Systems  Constitutional: Positive for malaise/fatigue. Negative for chills and fever.  HENT: Negative for hearing loss and tinnitus.   Eyes: Negative for blurred vision and double vision.  Respiratory: Negative for cough and shortness of breath.   Cardiovascular: Negative for chest pain and palpitations.  Gastrointestinal: Negative for constipation, heartburn and nausea.       Normal BM is once  a week'- "forever"  Musculoskeletal: Positive for back pain, joint pain (right groin since EMG), myalgias and neck pain.  Skin: Negative for rash.  Neurological: Positive for sensory change, focal weakness and weakness.  Psychiatric/Behavioral: Hallucinations: chronic--does not sleep night X years. The patient has insomnia.   All other systems reviewed and are negative.    Past Medical History:  Diagnosis Date  . Arthritis   . Cervical facet joint syndrome 04/22/2012  . Cervicalgia   . Chronic headaches    Dr Tessa Lerner [place] [facility]  . Chronic nausea    normal GES   . Complication of anesthesia    had problem with local anesthesia-tried to assist Physician  . DM (diabetes mellitus) ([place])   . Elevated liver enzymes    hx of, normal 03/2010  . GERD (gastroesophageal reflux disease) 05/31/2011   History Schatzki's ring, erosive reflux esophagitis, status post EGD and dilation 8/11 and 11/20  . Glaucoma    lens in both eyes  . Hemorrhoids 11/17/08   Colonoscopy Dr Laural Golden  . Occipital neuralgia   . PTSD (post-traumatic stress disorder)   . Renal lithiasis   . Seizure ([place])    12 yrs ago-med related  . Torticollis   . Unspecified musculoskeletal disorders and symptoms referable to neck    cervical/trapezius    Past Surgical History:  Procedure Laterality Date  . BREAST LUMPECTOMY     left  . CATARACT EXTRACTION W/PHACO Left 10/07/2016   Procedure: CATARACT EXTRACTION PHACO AND INTRAOCULAR LENS PLACEMENT (IOC);  Surgeon: Tonny Branch, MD;  Location: [facility];  Service: Ophthalmology;  Laterality: Left;  CDE: 4.98  . CATARACT EXTRACTION W/PHACO Right 10/21/2016   Procedure: CATARACT EXTRACTION PHACO AND INTRAOCULAR  LENS PLACEMENT RIGHT EYE CDE - 6.24;  Surgeon: Tonny Branch, MD;  Location: [facility];  Service: Ophthalmology;  Laterality: Right;  right  . CHOLECYSTECTOMY    . COLONOSCOPY  11/17/08   ext hemorrhoids  . CRANIOTOMY     secondary TBI  . CYST EXCISION     left wrist  .  ESOPHAGOGASTRODUODENOSCOPY  04/05/10   [name] (50F),erosive reflux esophagitis, small hiatal hernia,  . ESOPHAGOGASTRODUODENOSCOPY (EGD) WITH PROPOFOL N/A 08/30/2019   Procedure: ESOPHAGOGASTRODUODENOSCOPY (EGD) WITH PROPOFOL;  Surgeon: Daneil Dolin, MD;  Location: [facility];  Service: Endoscopy;  Laterality: N/A;  3:30pm - pt knows to arrive at 9:45  . EXTRACORPOREAL SHOCK WAVE LITHOTRIPSY Left 12/13/2019   Procedure: EXTRACORPOREAL SHOCK WAVE LITHOTRIPSY (ESWL);  Surgeon: Festus Aloe, MD;  Location: [facility];  Service: Urology;  Laterality: Left;  . FINGER SURGERY     removal cyst left pinky  . gastro empy study  03/03/10   normal  . HAND SURGERY    . MALONEY DILATION N/A 08/30/2019   Procedure: Venia Minks DILATION;  Surgeon: Daneil Dolin, MD;  Location: [facility];  Service: Endoscopy;  Laterality: N/A;  . NECK SURGERY  1998  . NEPHROLITHOTOMY  05/13/2012   Procedure: NEPHROLITHOTOMY PERCUTANEOUS;  Surgeon: Franchot Gallo, MD;  Location: [facility];  Service: Urology;  Laterality: Left;     . SURAL NERVE BX Right 02/28/2020   Procedure: SURAL NERVE / Quadricep BIOPSY;  Surgeon: Eustace Moore, MD;  Location: [facility];  Service: Neurosurgery;  Laterality: Right;    Family History  Problem Relation Age of Onset  . Colon cancer Mother 92  . Heart disease Father   . Diabetes Father   . Diabetes Paternal Grandmother   . Heart disease Paternal Grandfather   . Diverticulitis Daughter     Social History:  Married. Was independent with walker till 4 weeks PTA--has been using a walker since. She reports that she has never smoked. She has never used smokeless tobacco. She reports that she does not drink alcohol and does not use drugs.    Allergies  Allergen Reactions  . Aspirin Anaphylaxis and Hives    Slurred speech, blurred vision- also  . Hydromorphone Hcl Shortness Of Breath and Other (See Comments)    Severe shortness of breath  . Vimpat  [Lacosamide] Other (See Comments)    Hallucinations, out of body experience, confusion   . Depakote [Divalproex Sodium] Other (See Comments)    Reaction not recalled by husband  . Milnacipran Hcl Other (See Comments)    Dizziness   . Pristiq [Desvenlafaxine Succinate Monohydrate] Other (See Comments)    Dizziness   . Capsaicin Rash and Other (See Comments)    Red rash  . Gabapentin Other (See Comments)    States leg pain worsened, pt d/c'd   Medications Prior to Admission  Medication Sig Dispense Refill  . bisacodyl (BISACODYL) 5 MG EC tablet Take 5 mg by mouth daily as needed for mild constipation or moderate constipation.     . dapagliflozin propanediol (FARXIGA) 10 MG TABS tablet Take 1 tablet (10 mg total) by mouth daily before breakfast. 30 tablet 2  . magnesium oxide (MAG-OX) 400 (241.3 Mg) MG tablet Take 1 tablet (400 mg total) by mouth 2 (two) times daily. 60 tablet 2  . metFORMIN (GLUCOPHAGE) 500 MG tablet Take 2 tablets (1,000 mg total) by mouth 2 (two) times daily. 360 tablet 1  . methadone (DOLOPHINE) 10 MG tablet  Take 2 tablets (20 mg total) by mouth 3 (three) times daily. 180 tablet 0  . nortriptyline (PAMELOR) 25 MG capsule TAKE 1 CAPSULE BY MOUTH AT BEDTIME. (Patient taking differently: Take 25 mg by mouth at bedtime. ) 30 capsule 0  . ondansetron (ZOFRAN) 4 MG tablet Take 1 tablet (4 mg total) by mouth every 8 (eight) hours as needed for nausea or vomiting. 4 tablet 0  . tapentadol (NUCYNTA) 50 MG tablet Take 1 tablet (50 mg total) by mouth every 12 (twelve) hours as needed. (Patient taking differently: Take 50 mg by mouth every 12 (twelve) hours as needed (for breakthrough migraines). ) 60 tablet 0  . tiZANidine (ZANAFLEX) 2 MG tablet TAKE (1) TABLET EVERY SIX HOURS AS NEEDED FOR MUSCLE SPASMS. (Patient taking differently: Take 2 mg by mouth every 6 (six) hours as needed for muscle spasms. ) 60 tablet 1  . topiramate (TOPAMAX) 50 MG tablet TAKE 2 TABLETS IN THE MORNING AND  4 TABLETS AT BEDTIME. (Patient taking differently: Take 100-200 mg by mouth See admin instructions. Take 100 mg by mouth in the morning and 200 mg at bedtime) 180 tablet 0  . ALPRAZolam (XANAX) 0.25 MG tablet Take 1-2 tabs (0.84m-0.50mg) 30-60 minutes before procedure. May repeat if needed.Do not drive. (Patient not taking: Reported on 02/23/2020) 4 tablet 0  . atorvastatin (LIPITOR) 10 MG tablet Take 1 tablet (10 mg total) by mouth daily. (Patient not taking: Reported on 02/23/2020) 30 tablet 2  . blood glucose meter kit and supplies KIT Dispense based on patient and insurance preference. Use up to four times daily as directed. (FOR ICD-9 250.00, 250.01). 1 each 0  . predniSONE (DELTASONE) 20 MG tablet Take 3 tablets (60 mg total) by mouth daily with breakfast. First dose may be taken at anytime (Patient not taking: Reported on 02/23/2020) 15 tablet 0    Drug Regimen Review  Drug regimen was reviewed and remains appropriate with no significant issues identified  Home: Home Living Family/patient expects to be discharged to:: Private residence Living Arrangements: Spouse/significant other Available Help at Discharge:  (spouse works from home) [place] of Home: [place] Stairs to enter ECenterPoint Energyof Steps: 5 on side. 4 in front Entrance Stairs-Rails: Right Home Layout:  (sleeps in recliner downstairs for years due to back pain) Alternate Level Stairs-Number of Steps: ~12 Alternate Level Stairs-Rails: Left Bathroom Shower/Tub: Tub/shower unit, WMultimedia programmer Handicapped height Bathroom Accessibility: Yes Home Equipment: WEnvironmental consultant- 2 wheels, Cane - single point  Lives With: Spouse   Functional History: Prior Function Level of Independence: Needs assistance Gait / Transfers Assistance Needed: still managing stairs [facility] ADL's / Homemaking Assistance Needed: husband assists with bathing and dressing. Pt stands in  shower- independent. Husband completes all iADLs. Comments: Plans to use downnstairs walk in shower  Functional Status:  Mobility: Bed Mobility Overal bed mobility: Needs Assistance Bed Mobility: Supine to Sit, Sit to Supine Supine to sit: Min assist, HOB elevated Sit to supine: Min assist, HOB elevated General bed mobility comments: Required assist for L LE with transfers.  Required increased time and use of bed rails Transfers Overall transfer level: Needs assistance Equipment used: Rolling walker (2 wheeled) Transfer via Lift Equipment: Stedy Transfers: Sit to/from Stand Sit to Stand: Mod assist (from normal height surface) Stand pivot transfers: Min assist, +2 physical assistance, +2 safety/equipment, From elevated surface General transfer comment: Performed sit to stand x 2 from normal height; required  mod A to power up and steady when moving hands to walker Ambulation/Gait Ambulation/Gait assistance: Min assist Gait Distance (Feet): 15 Feet (2' then 15') Assistive device: Rolling walker (2 wheeled) Gait Pattern/deviations: Step-to pattern, Decreased stride length, Steppage, Decreased dorsiflexion - right, Decreased dorsiflexion - left General Gait Details: Required cues for RW adn posture; required assist with RW for turns and in tight areas.  Pt with foot drop/steppage gait bilaterally - reports this is new for L side. Gait velocity: reduced Gait velocity interpretation: <1.31 ft/sec, indicative of household ambulator    ADL: ADL Overall ADL's : Needs assistance/impaired Eating/Feeding: Set up, Sitting, Bed level Grooming: Set up, Sitting, Bed level Upper Body Bathing: Minimal assistance, Sitting, Bed level Lower Body Bathing: Moderate assistance, Cueing for safety, Sitting/lateral leans, Sit to/from stand Upper Body Dressing : Minimal assistance, Sitting, Bed level Lower Body Dressing: Moderate assistance, Sitting/lateral leans, Bed level Toilet Transfer: Minimal  assistance, +2 for physical assistance, +2 for safety/equipment, RW, Stand-pivot Toilet Transfer Details (indicate cue type and reason): taking a few steps toward [facility] Toileting- Clothing Manipulation and Hygiene: Moderate assistance, +2 for physical assistance, +2 for safety/equipment, Sitting/lateral lean Functional mobility during ADLs: Minimal assistance, +2 for physical assistance, Rolling walker, Cueing for safety General ADL Comments: Pt limited by decreased strength, decreased activity tolerance, increased pain and decreased ability to care for self.  Cognition: Cognition Overall Cognitive Status: Within Functional Limits for tasks assessed Orientation Level: Oriented X4 Cognition Arousal/Alertness: Awake/alert Behavior During Therapy: WFL for tasks assessed/performed Overall Cognitive Status: Within Functional Limits for tasks assessed General Comments: motivated to get up to [facility]   Blood pressure (!) 128/62, pulse 91, temperature 98.8 F (37.1 C), temperature source Oral, resp. rate 18, height _0  (1.626 m), weight 60 kg, SpO2 97 %. Physical Exam Vitals and nursing note reviewed.  Constitutional:      General: She is not in acute distress.    Appearance: She is normal weight.  HENT:     Head: Normocephalic and atraumatic.     Right Ear: External ear normal.     Left Ear: External ear normal.     Nose: Nose normal.  Eyes:     General:        Right eye: No discharge.        Left eye: No discharge.     Extraocular Movements: Extraocular movements intact.  Cardiovascular:     Rate and Rhythm: Normal rate and regular rhythm.  Pulmonary:     Effort: Pulmonary effort is normal. No respiratory distress.     Breath sounds: Normal breath sounds. No stridor.  Abdominal:     General: Bowel sounds are normal. There is no distension.     Palpations: Abdomen is soft.  Musculoskeletal:     Cervical back: Normal range of motion and neck supple.     Comments: No edema or  tenderness in extremities  Skin:    General: Skin is warm and dry.  Neurological:     Mental Status: She is alert.     Comments: Alert Bilateral upper extremities: 4-4+/5 proximal distal Right lower extremity: Hip flexion, knee extension 4+/5, dorsiflexion 0/5 Left lower extremity: Hip flexion, knee flexion 0/5, wiggles toes Sensation diminished to light touch bilateral feet (right> left)  Psychiatric:        Mood and Affect: Mood normal.        Behavior: Behavior normal.     Results for orders placed or performed during the hospital encounter of 02/23/20 (  from the past 48 hour(s))  Glucose, capillary     Status: Abnormal   Collection Time: 03/01/20  4:58 PM  Result Value Ref Range   Glucose-Capillary 252 (H) 70 - 99 mg/dL    Comment: Glucose reference range applies only to samples taken after fasting for at least 8 hours.  Glucose, capillary     Status: Abnormal   Collection Time: 03/01/20  9:15 PM  Result Value Ref Range   Glucose-Capillary 335 (H) 70 - 99 mg/dL    Comment: Glucose reference range applies only to samples taken after fasting for at least 8 hours.   Comment 1 Notify RN    Comment 2 Document in Chart   Hemoglobin and hematocrit, blood     Status: Abnormal   Collection Time: 03/02/20  4:55 AM  Result Value Ref Range   Hemoglobin 10.3 (L) 12.0 - 15.0 g/dL   HCT 33.2 (L) 36 - 46 %    Comment: Performed at [facility] [address], [place], [place] [address]  Comprehensive metabolic panel     Status: Abnormal   Collection Time: 03/02/20  4:55 AM  Result Value Ref Range   Sodium 134 (L) 135 - 145 mmol/L   Potassium 4.4 3.5 - 5.1 mmol/L   Chloride 101 98 - 111 mmol/L   CO2 25 22 - 32 mmol/L   Glucose, Bld 208 (H) 70 - 99 mg/dL    Comment: Glucose reference range applies only to samples taken after fasting for at least 8 hours.   BUN 20 8 - 23 mg/dL   Creatinine, Ser 0.96 0.44 - 1.00 mg/dL   Calcium 9.2 8.9 - 10.3 mg/dL   Total Protein 7.4 6.5 - 8.1  g/dL   Albumin 2.6 (L) 3.5 - 5.0 g/dL   AST 22 15 - 41 U/L   ALT 30 0 - 44 U/L   Alkaline Phosphatase 110 38 - 126 U/L   Total Bilirubin 0.6 0.3 - 1.2 mg/dL   GFR calc non Af Amer >60 >60 mL/min   GFR calc Af Amer >60 >60 mL/min   Anion gap 8 5 - 15    Comment: Performed at [facility] [address] [place], [place] [address]  Glucose, capillary     Status: Abnormal   Collection Time: 03/02/20  6:14 AM  Result Value Ref Range   Glucose-Capillary 183 (H) 70 - 99 mg/dL    Comment: Glucose reference range applies only to samples taken after fasting for at least 8 hours.   Comment 1 Notify RN    Comment 2 Document in Chart   Glucose, capillary     Status: Abnormal   Collection Time: 03/02/20  7:38 AM  Result Value Ref Range   Glucose-Capillary 152 (H) 70 - 99 mg/dL    Comment: Glucose reference range applies only to samples taken after fasting for at least 8 hours.  Glucose, capillary     Status: Abnormal   Collection Time: 03/02/20  1:01 PM  Result Value Ref Range   Glucose-Capillary 265 (H) 70 - 99 mg/dL    Comment: Glucose reference range applies only to samples taken after fasting for at least 8 hours.  Glucose, capillary     Status: Abnormal   Collection Time: 03/02/20  4:16 PM  Result Value Ref Range   Glucose-Capillary 229 (H) 70 - 99 mg/dL    Comment: Glucose reference range applies only to samples taken after fasting for at least 8  hours.  Glucose, capillary     Status: Abnormal   Collection Time: 03/02/20  9:10 PM  Result Value Ref Range   Glucose-Capillary 240 (H) 70 - 99 mg/dL    Comment: Glucose reference range applies only to samples taken after fasting for at least 8 hours.  Glucose, capillary     Status: Abnormal   Collection Time: 03/03/20  6:15 AM  Result Value Ref Range   Glucose-Capillary 176 (H) 70 - 99 mg/dL    Comment: Glucose reference range applies only to samples taken after fasting for at least 8 hours.  Glucose, capillary     Status:  Abnormal   Collection Time: 03/03/20 10:28 AM  Result Value Ref Range   Glucose-Capillary 267 (H) 70 - 99 mg/dL    Comment: Glucose reference range applies only to samples taken after fasting for at least 8 hours.  Hemoglobin and hematocrit, blood     Status: Abnormal   Collection Time: 03/03/20 12:52 PM  Result Value Ref Range   Hemoglobin 10.7 (L) 12.0 - 15.0 g/dL   HCT 33.7 (L) 36 - 46 %    Comment: Performed at [facility] [address] [place],  [address]  Comprehensive metabolic panel     Status: Abnormal   Collection Time: 03/03/20 12:52 PM  Result Value Ref Range   Sodium 133 (L) 135 - 145 mmol/L   Potassium 4.5 3.5 - 5.1 mmol/L   Chloride 99 98 - 111 mmol/L   CO2 27 22 - 32 mmol/L   Glucose, Bld 280 (H) 70 - 99 mg/dL    Comment: Glucose reference range applies only to samples taken after fasting for at least 8 hours.   BUN 20 8 - 23 mg/dL   Creatinine, Ser 0.95 0.44 - 1.00 mg/dL   Calcium 9.3 8.9 - 10.3 mg/dL   Total Protein 7.5 6.5 - 8.1 g/dL   Albumin 2.6 (L) 3.5 - 5.0 g/dL   AST 23 15 - 41 U/L   ALT 28 0 - 44 U/L   Alkaline Phosphatase 116 38 - 126 U/L   Total Bilirubin 0.4 0.3 - 1.2 mg/dL   GFR calc non Af Amer >60 >60 mL/min   GFR calc Af Amer >60 >60 mL/min   Anion gap 7 5 - 15    Comment: Performed at [facility], [place] [address] [place], [place] [address]  Glucose, capillary     Status: Abnormal   Collection Time: 03/03/20  1:01 PM  Result Value Ref Range   Glucose-Capillary 259 (H) 70 - 99 mg/dL    Comment: Glucose reference range applies only to samples taken after fasting for at least 8 hours.   No results found.     Medical Problem List and Plan: 1. 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  -patient may shower  -ELOS/Goals: 10-14 days/supervision  Admit to CIR 2.  Antithrombotics: -DVT/anticoagulation:  Pharmaceutical: Lovenox  -antiplatelet  therapy: N/A 3. Chronic neck/back/Pain Management: Continue Methadone 20 mg tid, Pamelor 25 mg/hs and Nuycnta prn. Tizanidine tid prn for spasms.    Monitor with increased exertion 4.  Mood: LCSW to follow for evaluation ad support.   -antipsychotic agents: N/A 5. Neuropsych: This patient is capable of making decisions on her own behalf. 6. Skin/Wound Care: Routine pressure relief measures.  7. Fluids/Electrolytes/Nutrition: Monitor I/O.  CMP ordered. 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  9.T2DM-poorly  controlled: A1c- 8.6. Continue Levemir 5 units BID--increase meal coverage to 10 units tic ac  Monitor with increased mobility 10. Normocytic anemia: Stable overall. Monitor for signs of infection.   CBC ordered. 11. Low calorie malnutrition: Continue supplement 12. Hyponatremia/ chronic Hypomagnesemia: Low Na- likely due to IVIG-- recovering.  Mag ordered for Monday  13. Abnormal LFTs: Likely due to IVIG.   CMP ordered. 14. Constipation: Dulcolax prn effective.  15. Persistent daily HA: On topamax 100 mg/day.   Monitor with increased activity  Bary Leriche, PA-C 03/03/2020  I have personally performed a face to face diagnostic evaluation, including, but not limited to relevant history and physical exam findings, of this patient and developed relevant assessment and plan.  Additionally, I have reviewed and concur with the physician assistant's documentation above.  Delice Lesch, MD, ABPMR  The patient's status has not changed. The original post admission physician evaluation remains appropriate, and any changes from the pre-admission screening or documentation from the acute chart are noted above.   Delice Lesch, MD, ABPMR

## 2020-03-03 NOTE — Progress Notes (Signed)
Courtney Heys, MD  Physician  Physical Medicine and Rehabilitation  Consult Note      Signed  Date of Service:  03/02/2020  9:42 AM      Related encounter: ED to Hosp-Admission (Current) from 02/23/2020 in [place] [facility]      Signed      Expand All Collapse All  Show:Clear all _0 Manual_1 Template_2 Copied  Added by: _3 Bary Leriche, PA-C_4 Courtney Heys, MD  _5 Hover for details          Physical Medicine and Rehabilitation Consult     Reason for Consult:Functional deficits.   Referring Physician: Dr. Ree Kida      HPI: Monica [name] is a 69 y.o. female with history of uncontrolled T2DM with polyneuropathy, cervicalgia, chronic HA, chronic pain- on Methadone/Nucynta (Dr. Naaman Plummer), constipation  (goes once a week), gait disorder with multiple falls. 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 She was admitted via Neurology office on 02/23/20 due to increase in weakness and pain for the past 2-3 weeks with numbness/tingling in feel and AKI. She was started on IVF and empirically treated with IVIG with improvement in weakness as well as sensation.   She underwent right sural nerve and superficial quadriceps muscle biopsy by Dr. Ronnald Ramp on 07/19--> path pending.    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  Abdominal ultrasound done due to abnormal LFTs and showed increased echogenicity suggestive or underlying liver disease and/or steatosis. Hgb A1c- 10.3 and blood sugars remain labile. Pain control improving --has not used IV morphine X 24 hours. use. Has been refusing laxative to manage constipation.  Therapy ongoing and patient now showing improvement in activity tolerance. She  has had functional decline in the past month and CIR recommended for follow up therapy.      Pt reports she's been a patient of Dr Naaman Plummer'- our partner for 20+ years.  LBM this AM- dealing a lot with muscle spasms- feels like someone is wringing leg out in different directions- tizanidine takes the edge off of it. On 2 mg q6 hours prn. Doesn't "jump" but "sometimes will vibrate".    Hasn't taken IV morphine in 1+ day.  Waiting for biopsy results.        Review of Systems  Constitutional: Negative for chills and fever.  HENT: Negative for hearing loss and tinnitus.   Eyes: Negative for blurred vision and double vision.  Respiratory: Negative for cough and shortness of breath.   Cardiovascular: Negative for chest pain, palpitations and leg swelling.  Gastrointestinal: Negative for heartburn and nausea.  Genitourinary: Negative for dysuria and urgency.  Musculoskeletal: Positive for myalgias and neck pain. Negative for back pain.       Sharp right groin pain since EMG. Numbness left foot from ankle down and RLE.  Skin: Negative for itching and rash.  Neurological: Positive for sensory change (RLE numb most of time but has severe dysesthesias to touch. ) and headaches (constant due to neck injury years ago). Negative for dizziness.  Psychiatric/Behavioral: The patient has insomnia.   All other systems reviewed and are negative.         Past Medical History:  Diagnosis Date  . Arthritis    . Cervical facet joint syndrome 04/22/2012  . Cervicalgia    . Chronic headaches  Dr Tessa Lerner [place] [facility]  . Chronic nausea      normal GES   . Complication of anesthesia      had problem with local anesthesia-tried to assist Physician  . DM (diabetes mellitus) ([place])    . Elevated liver enzymes      hx of, normal 03/2010  . GERD (gastroesophageal reflux disease) 05/31/2011    History Schatzki's ring, erosive reflux esophagitis, status post last EGD and dilation 04/05/10   . Glaucoma       lens in both eyes  . Hemorrhoids 11/17/08    Colonoscopy Dr Laural Golden  . Occipital neuralgia    . PTSD (post-traumatic stress disorder)    . Renal lithiasis    . Seizure ([place])      12 yrs ago-med related  . Torticollis    . Unspecified musculoskeletal disorders and symptoms referable to neck      cervical/trapezius           Past Surgical History:  Procedure Laterality Date  . BREAST LUMPECTOMY        left  . CATARACT EXTRACTION W/PHACO Left 10/07/2016    Procedure: CATARACT EXTRACTION PHACO AND INTRAOCULAR LENS PLACEMENT (IOC);  Surgeon: Tonny Branch, MD;  Location: [facility];  Service: Ophthalmology;  Laterality: Left;  CDE: 4.98  . CATARACT EXTRACTION W/PHACO Right 10/21/2016    Procedure: CATARACT EXTRACTION PHACO AND INTRAOCULAR LENS PLACEMENT RIGHT EYE CDE - 6.24;  Surgeon: Tonny Branch, MD;  Location: [facility];  Service: Ophthalmology;  Laterality: Right;  right  . CHOLECYSTECTOMY      . COLONOSCOPY   11/17/08    ext hemorrhoids  . CRANIOTOMY        secondary TBI  . CYST EXCISION        left wrist  . ESOPHAGOGASTRODUODENOSCOPY   04/05/10    [name] (71F),erosive reflux esophagitis, small hiatal hernia,  . ESOPHAGOGASTRODUODENOSCOPY (EGD) WITH PROPOFOL N/A 08/30/2019    Procedure: ESOPHAGOGASTRODUODENOSCOPY (EGD) WITH PROPOFOL;  Surgeon: Daneil Dolin, MD;  Location: [facility];  Service: Endoscopy;  Laterality: N/A;  3:30pm - pt knows to arrive at 9:45  . EXTRACORPOREAL SHOCK WAVE LITHOTRIPSY Left 12/13/2019    Procedure: EXTRACORPOREAL SHOCK WAVE LITHOTRIPSY (ESWL);  Surgeon: Festus Aloe, MD;  Location: [facility];  Service: Urology;  Laterality: Left;  . FINGER SURGERY        removal cyst left pinky  . gastro empy study   03/03/10    normal  . HAND SURGERY      . MALONEY DILATION N/A 08/30/2019    Procedure: Venia Minks DILATION;  Surgeon: Daneil Dolin, MD;  Location: [facility];  Service: Endoscopy;  Laterality: N/A;  . NECK SURGERY   1998    . NEPHROLITHOTOMY   05/13/2012    Procedure: NEPHROLITHOTOMY PERCUTANEOUS;  Surgeon: Franchot Gallo, MD;  Location: [facility];  Service: Urology;  Laterality: Left;      . SURAL NERVE BX Right 02/28/2020    Procedure: SURAL NERVE / Quadricep BIOPSY;  Surgeon: Eustace Moore, MD;  Location: [facility];  Service: Neurosurgery;  Laterality: Right;      Family History  Problem Relation Age of Onset  . Colon cancer Mother 8  . Heart disease Father    . Diabetes Father    . Diabetes Paternal Grandmother    . Heart disease Paternal Grandfather    . Diverticulitis Daughter        Social History: Married. Independent --has been  using a walker since March/wheelchair since 4 weeks. She  reports that she has never smoked. She has never used smokeless tobacco. She reports that she does not drink alcohol and does not use drugs.           Allergies  Allergen Reactions  . Aspirin Anaphylaxis and Hives      Slurred speech, blurred vision- also  . Hydromorphone Hcl Shortness Of Breath and Other (See Comments)      Severe shortness of breath  . Vimpat [Lacosamide] Other (See Comments)      Hallucinations, out of body experience, confusion   . Depakote [Divalproex Sodium] Other (See Comments)      Reaction not recalled by husband  . Milnacipran Hcl Other (See Comments)      Dizziness    . Pristiq [Desvenlafaxine Succinate Monohydrate] Other (See Comments)      Dizziness    . Capsaicin Rash and Other (See Comments)      Red rash  . Gabapentin Other (See Comments)      States leg pain worsened, pt d/c'd            Medications Prior to Admission  Medication Sig Dispense Refill  . bisacodyl (BISACODYL) 5 MG EC tablet Take 5 mg by mouth daily as needed for mild constipation or moderate constipation.       . dapagliflozin propanediol (FARXIGA) 10 MG TABS tablet Take 1 tablet (10 mg total) by mouth daily before breakfast. 30 tablet 2  . magnesium oxide (MAG-OX) 400 (241.3 Mg) MG tablet Take 1 tablet  (400 mg total) by mouth 2 (two) times daily. 60 tablet 2  . metFORMIN (GLUCOPHAGE) 500 MG tablet Take 2 tablets (1,000 mg total) by mouth 2 (two) times daily. 360 tablet 1  . methadone (DOLOPHINE) 10 MG tablet Take 2 tablets (20 mg total) by mouth 3 (three) times daily. 180 tablet 0  . nortriptyline (PAMELOR) 25 MG capsule TAKE 1 CAPSULE BY MOUTH AT BEDTIME. (Patient taking differently: Take 25 mg by mouth at bedtime. ) 30 capsule 0  . ondansetron (ZOFRAN) 4 MG tablet Take 1 tablet (4 mg total) by mouth every 8 (eight) hours as needed for nausea or vomiting. 4 tablet 0  . tapentadol (NUCYNTA) 50 MG tablet Take 1 tablet (50 mg total) by mouth every 12 (twelve) hours as needed. (Patient taking differently: Take 50 mg by mouth every 12 (twelve) hours as needed (for breakthrough migraines). ) 60 tablet 0  . tiZANidine (ZANAFLEX) 2 MG tablet TAKE (1) TABLET EVERY SIX HOURS AS NEEDED FOR MUSCLE SPASMS. (Patient taking differently: Take 2 mg by mouth every 6 (six) hours as needed for muscle spasms. ) 60 tablet 1  . topiramate (TOPAMAX) 50 MG tablet TAKE 2 TABLETS IN THE MORNING AND 4 TABLETS AT BEDTIME. (Patient taking differently: Take 100-200 mg by mouth See admin instructions. Take 100 mg by mouth in the morning and 200 mg at bedtime) 180 tablet 0  . ALPRAZolam (XANAX) 0.25 MG tablet Take 1-2 tabs (0.57m-0.50mg) 30-60 minutes before procedure. May repeat if needed.Do not drive. (Patient not taking: Reported on 02/23/2020) 4 tablet 0  . atorvastatin (LIPITOR) 10 MG tablet Take 1 tablet (10 mg total) by mouth daily. (Patient not taking: Reported on 02/23/2020) 30 tablet 2  . blood glucose meter kit and supplies KIT Dispense based on patient and insurance preference. Use up to four times daily as directed. (FOR ICD-9 250.00, 250.01). 1 each 0  . predniSONE (DELTASONE) 20 MG  tablet Take 3 tablets (60 mg total) by mouth daily with breakfast. First dose may be taken at anytime (Patient not taking: Reported on  02/23/2020) 15 tablet 0      Home: [place] expects to be discharged to:: Private residence Living Arrangements: Spouse/significant other Available Help at Discharge: Family, Available 24 hours/day Type of Home: House Home Access: Stairs to enter [employer] of Steps: 5 on side. 4 in front Entrance Stairs-Rails: Right Home Layout: Multi-level, Able to live on main level with bedroom/bathroom Alternate Level Stairs-Number of Steps: ~12 Alternate Level Stairs-Rails: Left Bathroom Shower/Tub: Tub/shower unit, Multimedia programmer: Handicapped height Bathroom Accessibility: Yes Home Equipment: Environmental consultant - 2 wheels, Cane - single point  Functional History: Prior Function Level of Independence: Needs assistance Gait / Transfers Assistance Needed: still managing stairs >3 weeks ago with [facility]; assisted with transfers ADL's / Homemaking Assistance Needed: husband assists with bathing and dressing. Pt stands in shower- independent. Husband completes all iADLs. Comments: Plans to use downnstairs walk in shower Functional Status:  Mobility: Bed Mobility Overal bed mobility: Needs Assistance Bed Mobility: Supine to Sit, Sit to Supine Supine to sit: Min assist, HOB elevated Sit to supine: Min assist, HOB elevated General bed mobility comments: Required assist for L LE with transfers.  Required increased time and use of bed rails Transfers Overall transfer level: Needs assistance Equipment used: Rolling walker (2 wheeled) Transfer via Lift Equipment: Stedy Transfers: Sit to/from Stand Sit to Stand: Mod assist (from normal height surface) Stand pivot transfers: Min assist, +2 physical assistance, +2 safety/equipment, From elevated surface General transfer comment: Performed sit to stand x 2 from normal height; required mod A to power up and steady when moving hands to walker Ambulation/Gait Ambulation/Gait assistance: Min assist Gait Distance (Feet): 15  Feet (2' then 15') Assistive device: Rolling walker (2 wheeled) Gait Pattern/deviations: Step-to pattern, Decreased stride length, Steppage, Decreased dorsiflexion - right, Decreased dorsiflexion - left General Gait Details: Required cues for RW adn posture; required assist with RW for turns and in tight areas.  Pt with foot drop/steppage gait bilaterally - reports this is new for L side. Gait velocity: reduced Gait velocity interpretation: <1.31 ft/sec, indicative of household ambulator   ADL: ADL Overall ADL's : Needs assistance/impaired Eating/Feeding: Set up, Sitting, Bed level Grooming: Set up, Sitting, Bed level Upper Body Bathing: Minimal assistance, Sitting, Bed level Lower Body Bathing: Moderate assistance, Cueing for safety, Sitting/lateral leans, Sit to/from stand Upper Body Dressing : Minimal assistance, Sitting, Bed level Lower Body Dressing: Moderate assistance, Sitting/lateral leans, Bed level Toilet Transfer: Minimal assistance, +2 for physical assistance, +2 for safety/equipment, RW, Stand-pivot Toilet Transfer Details (indicate cue type and reason): taking a few steps toward [facility] Toileting- Clothing Manipulation and Hygiene: Moderate assistance, +2 for physical assistance, +2 for safety/equipment, Sitting/lateral lean Functional mobility during ADLs: Minimal assistance, +2 for physical assistance, Rolling walker, Cueing for safety General ADL Comments: Pt limited by decreased strength, decreased activity tolerance, increased pain and decreased ability to care for self.   Cognition: Cognition Overall Cognitive Status: Within Functional Limits for tasks assessed Orientation Level: Oriented X4 Cognition Arousal/Alertness: Awake/alert Behavior During Therapy: WFL for tasks assessed/performed Overall Cognitive Status: Within Functional Limits for tasks assessed General Comments: motivated to get up to [employer]     Blood pressure 133/88, pulse 87, temperature 98.4 F (36.9 C),  temperature source Oral, resp. rate 18, height _0  (1.626 m), weight 60 kg, SpO2 98 %. Physical Exam Vitals and nursing note  reviewed.  Constitutional:      Appearance: Normal appearance.     Comments: Older woman,  lying in bed- frail appearing- husband at bedside, pillow around neck, NAD  HENT:     Head: Normocephalic and atraumatic.     Comments: Equal smile    Right Ear: External ear normal.     Left Ear: External ear normal.     Nose: Nose normal. No congestion or rhinorrhea.     Mouth/Throat:     Mouth: Mucous membranes are moist.     Pharynx: Oropharynx is clear. No oropharyngeal exudate or posterior oropharyngeal erythema.  Eyes:     Extraocular Movements: Extraocular movements intact.     Comments: No nystagmus  Neck:     Comments: Neck muscles tight Cardiovascular:     Rate and Rhythm: Tachycardia present.     Comments: RRR- no M/R/G Pulmonary:     Comments: CTA B/L- no W/R/R- good air movement Abdominal:     Comments: Soft, NT, ND, (+)BS, hypoactive  Musculoskeletal:     Cervical back: Normal range of motion. Tenderness present.     Comments: UEs- 5/5 in deltoids, biceps, triceps, WE< grip and finger abd B/L  LEs- RLE- HF 2-/5, KE 2-/5, KF 1/5, DF 2-/5, PF 2-/5 LLE- HF 3+/5, KE 3+/5, KF 3+/5, DF 1/5, PF 2-/5 Per pt, L foot drop has occurred over last 2 days- finished IVIG Monday night  Skin:    General: Skin is dry.     Comments: IV L hand- no infiltrate  Neurological:     Mental Status: She is alert and oriented to person, place, and time.     Motor: Weakness present.     Coordination: Coordination abnormal.     Comments: Intact sensation to light touch in all 4 extrmities Trace achilles L DTR- otherwise LE DTrs absent  Psychiatric:     Comments: appropriate        Lab Results Last 24 Hours       Results for orders placed or performed during the hospital encounter of 02/23/20 (from the past 24 hour(s))  Glucose, capillary     Status: Abnormal     Collection Time: 03/01/20 11:54 AM  Result Value Ref Range    Glucose-Capillary 195 (H) 70 - 99 mg/dL  Glucose, capillary     Status: Abnormal    Collection Time: 03/01/20  4:58 PM  Result Value Ref Range    Glucose-Capillary 252 (H) 70 - 99 mg/dL  Glucose, capillary     Status: Abnormal    Collection Time: 03/01/20  9:15 PM  Result Value Ref Range    Glucose-Capillary 335 (H) 70 - 99 mg/dL    Comment 1 Notify RN      Comment 2 Document in Chart    Hemoglobin and hematocrit, blood     Status: Abnormal    Collection Time: 03/02/20  4:55 AM  Result Value Ref Range    Hemoglobin 10.3 (L) 12.0 - 15.0 g/dL    HCT 33.2 (L) 36 - 46 %  Comprehensive metabolic panel     Status: Abnormal    Collection Time: 03/02/20  4:55 AM  Result Value Ref Range    Sodium 134 (L) 135 - 145 mmol/L    Potassium 4.4 3.5 - 5.1 mmol/L    Chloride 101 98 - 111 mmol/L    CO2 25 22 - 32 mmol/L    Glucose, Bld 208 (H) 70 - 99 mg/dL    BUN  20 8 - 23 mg/dL    Creatinine, Ser 0.96 0.44 - 1.00 mg/dL    Calcium 9.2 8.9 - 10.3 mg/dL    Total Protein 7.4 6.5 - 8.1 g/dL    Albumin 2.6 (L) 3.5 - 5.0 g/dL    AST 22 15 - 41 U/L    ALT 30 0 - 44 U/L    Alkaline Phosphatase 110 38 - 126 U/L    Total Bilirubin 0.6 0.3 - 1.2 mg/dL    GFR calc non Af Amer >60 >60 mL/min    GFR calc Af Amer >60 >60 mL/min    Anion gap 8 5 - 15  Glucose, capillary     Status: Abnormal    Collection Time: 03/02/20  6:14 AM  Result Value Ref Range    Glucose-Capillary 183 (H) 70 - 99 mg/dL    Comment 1 Notify RN      Comment 2 Document in Chart    Glucose, capillary     Status: Abnormal    Collection Time: 03/02/20  7:38 AM  Result Value Ref Range    Glucose-Capillary 152 (H) 70 - 99 mg/dL       Imaging Results (Last 48 hours)  US Abdomen Limited RUQ   Result Date: 03/01/2020 CLINICAL DATA:  Abnormal LFTs. EXAM: ULTRASOUND ABDOMEN LIMITED RIGHT UPPER QUADRANT COMPARISON:  CT abdomen pelvis dated Dec 30, 2019. FINDINGS: Gallbladder:  Surgically absent. Common bile duct: Diameter: 5 mm, normal. Liver: No focal lesion identified. Patchy, geographic areas of increased parenchymal echogenicity. Portal vein is patent on color Doppler imaging with normal direction of blood flow towards the liver. Other: None. IMPRESSION: 1. No acute abnormality. 2. Patchy, geographic areas of increased hepatic parenchymal echogenicity, suggestive of underlying liver disease and/or steatosis. Electronically Signed   By: Titus Dubin M.D.   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 Does the need for close, 24 hr/day medical supervision in concert with the patient's rehab needs make it unreasonable for this patient to be served in a less intensive setting? Yes 2. Co-Morbidities requiring supervision/potential complications: DM- O1B 8.6, PTSD, migraines, chronic pain syndrome 3. Due to bowel management, safety, skin/wound care, disease management, medication administration, pain management and patient education, does the patient require 24 hr/day rehab nursing? Yes 4. Does the patient require coordinated care of a physician, rehab nurse, therapy disciplines of PT and OT to address physical and functional deficits in the context of the above medical diagnosis(es)? Yes Addressing deficits in the following areas: balance, endurance, locomotion, strength, transferring, bathing, dressing, feeding, grooming and toileting 5. Can the patient actively participate in an intensive therapy program of at least 3 hrs of therapy per day at least 5 days per week? Yes 6. The potential for patient to make measurable gains while on inpatient rehab is good and fair 7. Anticipated functional outcomes upon discharge from inpatient rehab are modified independent and supervision  with PT, modified independent and supervision with OT, n/a  with SLP. 8. Estimated rehab length of stay to reach the above functional goals is: 2.5-3 weeks 9. Anticipated discharge destination: Home 10. Overall Rehab/Functional Prognosis: good and fair   RECOMMENDATIONS: This patient's condition is appropriate for continued rehabilitative care in the following setting: CIR Patient has agreed to participate in recommended program. Potentially Note that insurance prior authorization may be required for reimbursement for  recommended care.   Comment:  1. Pt's diagnosis is not quite nailed down- but thinking inflammatory polyneuropathy- EMG done; biopsy pending 2. Pt having a lot of muscle spasms- suggest increasing Zanaflex if can tolerate sedation for better spasm control-  3. LLE is getting weaker in last 2 days- is weaker than last strength exam noted- significantly- just wanted to make sure Neurology aware- since 1 days after IVIG completed, if note.  4. Will submit for inpt insurance approval, however, will need to see if have dx determined clearly, and stabilized so not getting worse prior to CIR admission.  5. Thank you for rehab consult.        Bary Leriche, PA-C 03/02/2020    I have personally performed a face to face diagnostic evaluation of this patient and formulated the key components of the plan.  Additionally, I have personally reviewed laboratory data, imaging studies, as well as relevant notes and concur with the physician assistant's documentation above.            Revision History                     Routing History           Note Details  Jan Fireman, MD File Time 03/02/2020  3:32 PM  Author Type Physician Status Signed  Last Editor Courtney Heys, MD Service Physical Medicine and Rehabilitation

## 2020-03-03 NOTE — Progress Notes (Signed)
Patient refused labs and vital signs.

## 2020-03-03 NOTE — Discharge Summary (Signed)
Physician Discharge Summary  Monica [name] EXN:[id number] DOB: May 24, 1951 DOA: 02/23/2020  PCP: Loman Brooklyn, FNP  Admit date: 02/23/2020 Discharge date: 03/03/2020  Time spent: 45 minutes  Recommendations for Outpatient Follow-up:  Patient will be discharged to inpatient rehab, continue therapy.  Follow up on results of biopsies.   Discharge Diagnoses:  Inflammatory polyradiculoneuropathy Chronic pain Diabetes mellitus, type II, uncontrolled with hyperglycemia  Chronic migraine Hyperlipidemia Normocytic Anemia Constipation Metabolic Acidosis Malnutrition of moderate degree/nonsevere malnutrition Abnormal LFTs  Hyponatremia RenalInsufficiency  Discharge Condition: Stable  Diet recommendation: heart healthy/carb modified  Filed Weights   02/23/20 0915 02/24/20 2021  Weight: 61.7 kg 60 kg    History of present illness:  On 02/23/2020 by Dr. Westley Chandler a 69 y.o.femalewith medical history significant forchronic migraine, type 2 diabetes, chronic pain on methadone, PTSD who presents with concerns of worsening lower leg pain.  Patient has been having bilateral lower leg pain for the past 8 months andwas referred toneurologyby her PMR physician. Shehas had extensive work-up including an MRI of the lumbar spine, EMG and an LP. EMG noted to have rightL3 radiculopathy and right L5 radiculopathy. LP noted to have few cells but significantly elevated CSF protein consistent with inflammatory polyradiculoneuropathy and IVIG was recommended. She was recently given a week of steroids at the beginning of July.  For the past 2 to 3 weeksher pain has acutely worsened andshe has not been able to walk andnowhas been wheelchair-bound. She describes the pain as being sharp, electric-like withnumbness and tingling of her bilateral feet. It is worse when she sits down. Her left leg is usually better than her right but recently it also started to have worsening  symptoms. She also feels as if her waistline is actually up at her lower ribs. Also has been having decreased bowel movement since symptoms started 8 months ago and has only been able to have 1 bowel movement per month. Her last bowel movement was 2 days ago after taking Dulcolax. Denies any issues with urine output. Denies any abdominal pain. No nausea or vomiting.  She was sent to ED by outpatient neurology for IVIG due to progressively worsening symptoms.   Hospital Course:  Inflammatory polyradiculoneuropathy -Differential diagnosis include idiopathic lumbosacral radicular plexus neuropathy/diabetic amyotrophy per neurology; feel it is less likely AIDP more likely to be idiopathic lumbosacral radicular plexus neuropathy but then they also feel that other differentials or to consider nondiabetes related sensory or motor neuropathies -Recent lumbar CT spine showed "Mild curvature convex to the left. 2 mm chronic anterolisthesis L4-5. Previous posterior decompression and discectomy at L4-5. No compressive stenosis. Bilateral facet osteoarthritis. L5-S1 facet osteoarthritis with 2 mm of anterolisthesis. No compressive stenosis. Mild non-compressive disc bulges at L2-3 and L3-4. Question if the nerve roots of the cauda equina are minimally larger than normal, some measuring up to 1-1.2 mm in diameter, raising the possibility of some neuropathic process. I do not think this potential finding is definitely valid." -MRI Pelvis on 02/18/20 showed "No acute findings or explanation for the patient's symptoms. Mild degenerative and postsurgical changes within the lumbar spine associated with a convex left scoliosis. No evidence of sacral nerve root encroachment. Mild muscular asymmetries as described." -Neurology consulted and appreciated, recommended getting an MRI of the L-spine with contrast however she declined repeat MRI it would only get it done under general anesthesia  -Folate, copper, vitamin B6/  B1/ B12, angiotensin-converting enzyme, WNL -SSA SSB antibodie was less than 0.2, Antinuclear antibodies  were positive IFA and she had a homogeneous pattern 1:160, ANCA negative -Heavy metal screen was negative and had 5 of arsenic, less than 1 of lead, and less than 1.0 mercury -Hemoglobin A1c was 8.6 -Neurology decided to give patient 5 treatments of IVIG -Continue pain control with bowel regimen for mild constipation  -Neurosurgery consulted and appreciated for sural nerve biopsy as well as right quadricep superficial biopsy, pending results -Continue supportive care with normal saline at 75 MLS per houras it is resumed given her renal insufficiency and mild hyponatremia -PT and OT recommending CIR now, CIR consulted and patient will be discharged to inpatient rehab -Patient continues to complain of muscle spasms- discussed with pharmacy, no other good options for muscle relaxants, all are about the same. Would not increase zanaflex given recent history of elevated LFTs. Potassium WNL as of 7/22. Magnesium ordered and pending.  Chronic pain -Continue methadone 20 mg p.o. 3 times daily, nortriptyline 25 mg p.o. nightly, prn tizanidine dose p.o. every 6 hours as needed for muscle spasm -Continue judicious use of her pain medications and she follows with Dr. Earlie Lou for pain -She is currently on IV morphine for breakthrough and getting 1 mg every 3 hours as needed we will titrate up to 1 mg every 2 hours as neededand will wean back to 1 mg every 3 hours as needed given that we will need to try and get her off of the IV morphine for discharge -PT/OT still recommending SNF, however patient would like to go home once her pain is managed   Diabetes mellitus, type II, uncontrolled with hyperglycemia  -Patient did present with hyperglycemia with elevated anion gap and metabolic acidosis which are now improving -Hemoglobin A1c 8.6 -Continue Levemir, insulin sliding scale and CBG  monitoring -Metformin and Dapagliflozin currently held- may resume on discharge, however will leave this up to inpatient rehab -Diabetes coordinator consulted  Chronic migraine -continue Topamax and tapentadol  Hyperlipidemia -Given elevated LFTs, atorvastatin was held -Statin restarted, as LFTs were back to normal range  Normocytic Anemia -Hemoglobin appears to be stable, currently 10.3 -Anemia panel shows adequate iron and storage -Continue to monitor CBC  Constipation -Likely opiate induced -Continue bowel regimen -Have ordered enema and suppository- however patient refusing -have had extensive conversation regarding constipation and how this could also be affecting her back pain  Metabolic Acidosis -Resolved  Malnutrition of moderate degree/nonsevere malnutrition -in the context of chronic illness -Dietitian was consulted for further evaluation recommendations and we remove the heart healthy diet restriction and she has been started on Ensure Enlive p.o. 3 times daily and she is also provided with a "high-calorie, high-protein nutrition therapy" handout  Abnormal LFTs  -Resolved -Continue to monitor LFTs -Hepatitis panel nonreactive -RUQ [place]: no acute abnormality. Patchy, geographic areas of increased hepatic parenchymal echogenicity suggestive of underlying liver disease and/or steatosis  Hyponatremia -mild, up to 134 today -BMP pending today  RenalInsufficiency -resolved -Creatinine had increased to 1.08, back down to 0.96 -BMP pending today  Consultants Neurology Neurosurgery  Procedures  RUQ ultrasound Right sural nerve biopsy, right superficial quadriceps muscle biopsy  Discharge Exam: Vitals:   03/03/20 0401 03/03/20 1001  BP: (!) 136/83 (!) 128/62  Pulse: 84 91  Resp: 17 18  Temp: 98.6 F (37 C) 98.8 F (37.1 C)  SpO2: 96% 97%     General: Well developed, well nourished, NAD, appears stated age  HEENT: NCAT, mucous membranes  moist.  Cardiovascular: S1 S2 auscultated, RRR  Respiratory: Clear to  auscultation bilaterally   Abdomen: Soft, nontender, nondistended, + bowel sounds  Extremities: warm dry without cyanosis clubbing or edema  Neuro: AAOx3, nonfocal, sensation intact, able to move lower ext  Psych: Appropriate mood and affect  Discharge Instructions Discharge Instructions    No wound care   Complete by: As directed      Allergies as of 03/03/2020      Reactions   Aspirin Anaphylaxis, Hives   Slurred speech, blurred vision- also   Hydromorphone Hcl Shortness Of Breath, Other (See Comments)   Severe shortness of breath   Vimpat [lacosamide] Other (See Comments)   Hallucinations, out of body experience, confusion    Depakote [divalproex Sodium] Other (See Comments)   Reaction not recalled by husband   Milnacipran Hcl Other (See Comments)   Dizziness   Pristiq [desvenlafaxine Succinate Monohydrate] Other (See Comments)   Dizziness   Capsaicin Rash, Other (See Comments)   Red rash   Gabapentin Other (See Comments)   States leg pain worsened, pt d/c'd      Medication List    STOP taking these medications   ALPRAZolam 0.25 MG tablet Commonly known as: Xanax   predniSONE 20 MG tablet Commonly known as: DELTASONE     TAKE these medications   atorvastatin 10 MG tablet Commonly known as: LIPITOR Take 1 tablet (10 mg total) by mouth daily.   bisacodyl 5 MG EC tablet Generic drug: bisacodyl Take 5 mg by mouth daily as needed for mild constipation or moderate constipation.   blood glucose meter kit and supplies Kit Dispense based on patient and insurance preference. Use up to four times daily as directed. (FOR ICD-9 250.00, 250.01).   dapagliflozin propanediol 10 MG Tabs tablet Commonly known as: Farxiga Take 1 tablet (10 mg total) by mouth daily before breakfast.   feeding supplement (ENSURE ENLIVE) Liqd Take 237 mLs by mouth 3 (three) times daily between meals.   magnesium  oxide 400 (241.3 Mg) MG tablet Commonly known as: MAG-OX Take 1 tablet (400 mg total) by mouth 2 (two) times daily.   metFORMIN 500 MG tablet Commonly known as: GLUCOPHAGE Take 2 tablets (1,000 mg total) by mouth 2 (two) times daily.   methadone 10 MG tablet Commonly known as: DOLOPHINE Take 2 tablets (20 mg total) by mouth 3 (three) times daily.   nortriptyline 25 MG capsule Commonly known as: PAMELOR TAKE 1 CAPSULE BY MOUTH AT BEDTIME.   ondansetron 4 MG tablet Commonly known as: ZOFRAN Take 1 tablet (4 mg total) by mouth every 8 (eight) hours as needed for nausea or vomiting.   tapentadol 50 MG tablet Commonly known as: NUCYNTA Take 1 tablet (50 mg total) by mouth every 12 (twelve) hours as needed. What changed: reasons to take this   tiZANidine 2 MG tablet Commonly known as: ZANAFLEX TAKE (1) TABLET EVERY SIX HOURS AS NEEDED FOR MUSCLE SPASMS. What changed: See the new instructions.   topiramate 50 MG tablet Commonly known as: TOPAMAX TAKE 2 TABLETS IN THE MORNING AND 4 TABLETS AT BEDTIME. What changed: See the new instructions.      Allergies  Allergen Reactions  . Aspirin Anaphylaxis and Hives    Slurred speech, blurred vision- also  . Hydromorphone Hcl Shortness Of Breath and Other (See Comments)    Severe shortness of breath  . Vimpat [Lacosamide] Other (See Comments)    Hallucinations, out of body experience, confusion   . Depakote [Divalproex Sodium] Other (See Comments)    Reaction not recalled by  husband  . Milnacipran Hcl Other (See Comments)    Dizziness   . Pristiq [Desvenlafaxine Succinate Monohydrate] Other (See Comments)    Dizziness   . Capsaicin Rash and Other (See Comments)    Red rash  . Gabapentin Other (See Comments)    States leg pain worsened, pt d/c'd      The results of significant diagnostics from this hospitalization (including imaging, microbiology, ancillary and laboratory) are listed below for reference.    Significant  Diagnostic Studies: CT LUMBAR SPINE W CONTRAST  Result Date: 02/17/2020 CLINICAL DATA:  Polyneuropathy.  Right leg weakness. EXAM: LUMBAR MYELOGRAM FLUOROSCOPY TIME:  0 minutes 44 seconds. 132.83 micro gray meter squared Tate in minutes and seconds PROCEDURE: After thorough discussion of risks and benefits of the procedure including bleeding, infection, injury to nerves, blood vessels, adjacent structures as well as headache and CSF leak, written and oral informed consent was obtained. Consent was obtained by Dr. Paulina Fusi. Time out form was completed. Patient was positioned prone on the fluoroscopy table. Local anesthesia was provided with 1% lidocaine without epinephrine after prepped and draped in the usual sterile fashion. Puncture was performed at right L3-4 using a 3 1/2 inch 20 gauge spinal needle via right paramedian approach. Using a single pass through the dura, the needle was placed within the thecal sac, with return of clear CSF. 15 mL of Isovue M-200 was injected into the thecal sac, with normal opacification of the nerve roots and cauda equina consistent with free flow within the subarachnoid space. I personally performed the lumbar puncture and administered the intrathecal contrast. I also personally performed acquisition of the myelogram images. TECHNIQUE: Contiguous axial images were obtained through the Lumbar spine after the intrathecal infusion of infusion. Coronal and sagittal reconstructions were obtained of the axial image sets. COMPARISON:  MRI 10/14/2019 FINDINGS: LUMBAR MYELOGRAM FINDINGS: Mild curvature convex to the right. No compressive stenosis at L3-4 or above. At L4-5, there is disc space narrowing consistent with previous discectomy. No compressive stenosis. L5-S1 shows facet osteoarthritis but no apparent compressive stenosis. CT LUMBAR MYELOGRAM FINDINGS: Five lumbar type vertebral bodies with mild curvature convex to the left. No disc pathology or stenosis from T11-12 through  L1-2. Conus tip is at T12-L1. L2-3: Mild disc bulge. No compressive canal stenosis. Mild left foraminal encroachment by osteophyte but without likely neural compression. L3-4: Mild disc bulge. Mild facet hypertrophy. No compressive stenosis. L4-5: Disc degeneration with disc space narrowing. Previous discectomy. Mild annular bulging. Bilateral facet osteoarthritis. No compressive canal or foraminal stenosis. L5-S1: Facet osteoarthritis left worse than right with 2 mm of anterolisthesis. No compressive canal or foraminal stenosis. Many of the nerve roots of the cauda equina show diameter of 1-1.2 mm, which subjectively seems slightly larger in normal, possibly indicating a neuropathic process. Incidental note is made of several renal calculi on the left without apparent obstruction. Largest stone in the lower pole measures 7 mm. IMPRESSION: Mild curvature convex to the left. 2 mm chronic anterolisthesis L4-5. Previous posterior decompression and discectomy at L4-5. No compressive stenosis. Bilateral facet osteoarthritis. L5-S1 facet osteoarthritis with 2 mm of anterolisthesis. No compressive stenosis. Mild non-compressive disc bulges at L2-3 and L3-4. Question if the nerve roots of the cauda equina are minimally larger than normal, some measuring up to 1-1.2 mm in diameter, raising the possibility of some neuropathic process. I do not think this potential finding is definitely valid. Electronically Signed   By: Paulina Fusi M.D.   On: 02/17/2020 12:36  MR PELVIS W WO CONTRAST  Result Date: 02/21/2020 CLINICAL DATA:  Bilateral leg numbness, worse on the left. Lumbosacral plexopathy. EXAM: MRI PELVIS WITHOUT CONTRAST TECHNIQUE: Multiplanar multisequence MR imaging of the pelvis was performed without intravenous contrast. Patient was unable to complete the examination, and no contrast was administered. COMPARISON:  Lumbar myelogram spine CT 02/17/2020. Abdominopelvic CT 12/30/2019. FINDINGS: Urinary Tract: Small  bilateral renal cysts. No evidence of hydronephrosis. The bladder appears unremarkable. Bowel: Fluid and stool throughout the colon. No significant bowel distension, wall thickening or surrounding inflammation. Vascular/Lymphatic: No enlarged pelvic lymph nodes. No significant vascular findings. Aortoiliac atherosclerosis. Reproductive:  The uterus and ovaries appear unremarkable. Other:  Intact anterior pelvic wall.  No ascites. Musculoskeletal: Mild chronic asymmetric atrophy of the right piriformis muscle. Mild asymmetric T2 hyperintensity in the right erector spinae musculature. The pelvic musculature otherwise appears symmetric without abnormal signal. There are no acute osseous findings. Mild degenerative and postsurgical changes are present within the lumbar spine associated with a convex left scoliosis. No sacral lesion or sacral nerve root encroachment identified. There is no presacral mass or inflammation. IMPRESSION: 1. No acute findings or explanation for the patient's symptoms. 2. Mild degenerative and postsurgical changes within the lumbar spine associated with a convex left scoliosis. No evidence of sacral nerve root encroachment. 3. Mild muscular asymmetries as described. Electronically Signed   By: Richardean Sale M.D.   On: 02/21/2020 16:16   NCV with EMG(electromyography)  Result Date: 02/07/2020 Melvenia Beam, MD     02/08/2020  7:12 PM   Full Name: Brentley Horrell Gender: Female MRN #: [id number] Date of Birth: 1950-08-14   Visit Date: 02/07/2020 13:54 Age: 67 Years Examining Physician: Sarina Ill, MD Referring Physician: Dr. Tessa Lerner Height: 5 feet 4 inch History: 69 year old patient here with severe back pain, radiating pain into the legs, leg weakness.  Symptoms started in January, the pain shoots down from her low back and hip into the right leg down to the foot.  She denies any significant symptoms in the left leg.  Her arms are also not involved.  Pain on the right leg starts in the  low back radiates to the latero-medial thigh and also radiates from the low back to the lateral thigh to the lateral lower leg and wraps to the top of the foot. Summary: Technically difficult exam due to pain; patient in significant pain, pleasant but having difficulty enduring the testing.  The right median motor nerve showed delayed distal onset latency (4.8 ms, normal less than 4.4).  The right ulnar ADM motor nerve showed delayed distal onset latency (3.7 ms, normal less than 3.3 ms).  The right peroneal motor nerve showed reduced amplitude (0.7 mV, normal greater than 2) and decreased conduction velocity (fibular head to ankle, 27 m/s, normal greater than 44) and decreased conduction velocity (popliteal fossa to fibular head, 24 m/s, normal greater than 44).  The right tibial motor nerve showed reduced amplitude (3.2 mV, normal greater than 4) and decreased conduction velocity (26 m/s, normal greater than 41).  The right radial sensory nerve showed delayed distal peak latency (3.2 ms, normal less than 2.9) and reduced amplitude (5 V, normal greater than 15).  The right sural sensory nerve and the right superficial peroneal sensory nerve both showed no response.  The right median sensory nerve showed delayed distal peak latency (3.9 ms, normal less than 3.4).  The right ulnar sensory nerve showed delayed distal peak latency (3.4 ms, normal less than 3.1).  F waves were within normal limits.All remaining nerves (as indicated in the following tables) were within normal limits.  The right iliopsoas showed increased spontaneous activity and diminished motor unit recruitment.  The right vastus medialis showed increased spontaneous activity, polyphasic motor units and diminished motor unit recruitment.  The right tibialis anterior showed increased spontaneous activity and diminished motor unit recruitment.  The right extensor hallucis longus showed increased spontaneous activity and diminished motor unit recruitment.   The left tibialis anterior showed increased spontaneous activity and diminished motor unit recruitment.  The left biceps femoris showed increased spontaneous activity.  The right biceps femoris showed decreased insertional activity.  The left extensor hallucis longus showed increased spontaneous activity and diminished motor unit recruitment.  Could not perform EMG needle testing on the paraspinal muscles as these are not reliable after surgery.   Conclusion: 1.  There is electrophysiologic evidence of acute/ongoing right L3 radiculopathy and acute/ongoing right L5 radiculopathy. 2. There is also electrophysiologic evidence for acute/ongoing left L5 radiculopathy. 3. 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 4. Recommend CT myelogram and referral to neurosurgery. Sarina Ill, M.D. [facility] [place], [place] [address] Tel: [phone] Fax: [phone] Verbal informed consent was obtained from the patient, patient was informed of potential risk of procedure, including bruising, bleeding, hematoma formation, infection, muscle weakness, muscle pain, numbness, among others.     Dickenson   Nerve / Sites Muscle Latency Ref. Amplitude Ref. Rel Amp Segments Distance Velocity Ref. Area   ms ms mV mV %  cm m/s m/s mVms R Median - APB    Wrist APB 4.8 ?4.4 5.0 ?4.0 100 Wrist - APB 7   17.0    Upper arm APB 9.0  4.4  87.4 Upper arm - Wrist 21 50 ?49 16.3 R Ulnar - ADM    Wrist ADM 3.7 ?3.3 11.0 ?6.0 100 Wrist - ADM 7   26.0    B.Elbow ADM 7.7  8.0  72.5 B.Elbow - Wrist 20 50 ?49 21.5    A.Elbow ADM 9.7  7.5  93.4 A.Elbow - B.Elbow 10 49 ?49 21.1        A.Elbow - Wrist     R Peroneal - EDB    Ankle EDB 5.7 ?6.5 0.7 ?2.0 100 Ankle - EDB 9   2.4    Fib head EDB 15.8  0.5  64.3 Fib head - Ankle 27 27 ?44 1.8    Pop fossa EDB 19.9  0.3  72.3 Pop fossa - Fib head 10 24 ?44 0.9        Pop fossa - Ankle     R Tibial - AH    Ankle AH 4.9 ?5.8 3.2 ?4.0 100 Ankle - AH 9   9.4    Pop fossa AH 19.0  2.2   69.7 Pop fossa - Ankle 37 26 ?41 7.8           SNC   Nerve / Sites Rec. Site Peak Lat Ref.  Amp Ref. Segments Distance Peak Diff Ref.   ms ms V V  cm ms ms R Radial - Anatomical snuff box (Forearm)    Forearm Wrist 3.2 ?2.9 5 ?15 Forearm - Wrist 10   R Sural - Ankle (Calf)    Calf Ankle NR ?4.4 NR ?6 Calf - Ankle 14   R Superficial peroneal - Ankle    Lat leg Ankle NR ?4.4 NR ?6 Lat leg - Ankle 14   R Median, Ulnar - Transcarpal comparison  Median Palm Wrist 2.7 ?2.2 55 ?35 Median Palm - Wrist 8      Ulnar Palm Wrist 2.6 ?2.2 37 ?12 Ulnar Palm - Wrist 8         Median Palm - Ulnar Palm  0.2 ?0.4 R Median - Orthodromic (Dig II, Mid palm)    Dig II Wrist 3.9 ?3.4 11 ?10 Dig II - Wrist 13   R Ulnar - Orthodromic, (Dig V, Mid palm)    Dig V Wrist 3.4 ?3.1 9 ?5 Dig V - Wrist 39                 F  Wave   Nerve F Lat Ref.  ms ms R Peroneal - EDB 45.0 ?56.0 R Tibial - AH 46.5 ?56.0 R Ulnar - ADM 31.7 ?32.0         EMG Summary Table   Spontaneous MUAP Recruitment Muscle IA Fib PSW Fasc Other Amp Dur. Poly Pattern R. Iliopsoas Normal None 3+ None _______ Normal Normal Normal Reduced R. Vastus medialis Normal None 3+ None _______ Normal Increased 2+ Reduced R. Tibialis anterior Normal None 3+ None _______ Normal Normal Normal Reduced R. Gastrocnemius (Medial head) Normal None None None _______ Normal Normal Normal Normal R. Extensor hallucis longus Normal None 2+ None _______ Normal Normal Normal Reduced L. Iliopsoas Normal None None None _______ Normal Normal Normal Normal L. Vastus medialis Normal None None None _______ Normal Normal Normal Normal L. Tibialis anterior Normal None 2+ None _______ Normal Normal Normal Reduced L. Gastrocnemius (Medial head) Normal None None None _______ Normal Normal Normal Normal R. Gluteus maximus Normal None None None _______ Normal Normal Normal Normal R. Gluteus medius Normal None None None _______ Normal Normal Normal Normal L. Biceps femoris (short head) Normal None 3+ None _______  Normal Normal Normal Normal R. Biceps femoris (short head) Decreased None None None _______ Normal Normal Normal Normal R. Deltoid Normal None None None _______ Normal Normal Normal Normal R. Triceps brachii Normal None None None _______ Normal Normal Normal Normal R. Pronator teres Normal None None None _______ Normal Normal Normal Normal R. First dorsal interosseous Normal None None None _______ Normal Normal Normal Normal R. Biceps brachii Normal None None None _______ Normal Normal Normal Normal R. Opponens pollicis Normal None None None _______ Normal Normal Normal Normal L. Extensor hallucis longus Normal None 2+ None _______ Normal Normal Normal Reduced R. Thoracic paraspinals (mid) Normal None None None _______ Normal Normal Normal Normal    DG MYELOGRAPHY LUMBAR INJ LUMBOSACRAL  Result Date: 02/17/2020 CLINICAL DATA:  Polyneuropathy.  Right leg weakness. EXAM: LUMBAR MYELOGRAM FLUOROSCOPY TIME:  0 minutes 44 seconds. 132.83 micro gray meter squared [place] in minutes and seconds PROCEDURE: After thorough discussion of risks and benefits of the procedure including bleeding, infection, injury to nerves, blood vessels, adjacent structures as well as headache and CSF leak, written and oral informed consent was obtained. Consent was obtained by Dr. Nelson Chimes. Time out form was completed. Patient was positioned prone on the fluoroscopy table. Local anesthesia was provided with 1% lidocaine without epinephrine after prepped and draped in the usual sterile fashion. Puncture was performed at right L3-4 using a 3 1/2 inch 20 gauge spinal needle via right paramedian approach. Using a single pass through the dura, the needle was placed within the thecal sac, with return of clear CSF. 15 mL of Isovue M-200 was injected into the thecal sac, with normal opacification of the nerve roots and cauda equina consistent with  free flow within the subarachnoid space. I personally performed the lumbar puncture and administered  the intrathecal contrast. I also personally performed acquisition of the myelogram images. TECHNIQUE: Contiguous axial images were obtained through the Lumbar spine after the intrathecal infusion of infusion. Coronal and sagittal reconstructions were obtained of the axial image sets. COMPARISON:  MRI 10/14/2019 FINDINGS: LUMBAR MYELOGRAM FINDINGS: Mild curvature convex to the right. No compressive stenosis at L3-4 or above. At L4-5, there is disc space narrowing consistent with previous discectomy. No compressive stenosis. L5-S1 shows facet osteoarthritis but no apparent compressive stenosis. CT LUMBAR MYELOGRAM FINDINGS: Five lumbar type vertebral bodies with mild curvature convex to the left. No disc pathology or stenosis from T11-12 through L1-2. Conus tip is at T12-L1. L2-3: Mild disc bulge. No compressive canal stenosis. Mild left foraminal encroachment by osteophyte but without likely neural compression. L3-4: Mild disc bulge. Mild facet hypertrophy. No compressive stenosis. L4-5: Disc degeneration with disc space narrowing. Previous discectomy. Mild annular bulging. Bilateral facet osteoarthritis. No compressive canal or foraminal stenosis. L5-S1: Facet osteoarthritis left worse than right with 2 mm of anterolisthesis. No compressive canal or foraminal stenosis. Many of the nerve roots of the cauda equina show diameter of 1-1.2 mm, which subjectively seems slightly larger in normal, possibly indicating a neuropathic process. Incidental note is made of several renal calculi on the left without apparent obstruction. Largest stone in the lower pole measures 7 mm. IMPRESSION: Mild curvature convex to the left. 2 mm chronic anterolisthesis L4-5. Previous posterior decompression and discectomy at L4-5. No compressive stenosis. Bilateral facet osteoarthritis. L5-S1 facet osteoarthritis with 2 mm of anterolisthesis. No compressive stenosis. Mild non-compressive disc bulges at L2-3 and L3-4. Question if the nerve  roots of the cauda equina are minimally larger than normal, some measuring up to 1-1.2 mm in diameter, raising the possibility of some neuropathic process. I do not think this potential finding is definitely valid. Electronically Signed   By: Paulina Fusi M.D.   On: 02/17/2020 12:36   US Abdomen Limited RUQ  Result Date: 03/01/2020 CLINICAL DATA:  Abnormal LFTs. EXAM: ULTRASOUND ABDOMEN LIMITED RIGHT UPPER QUADRANT COMPARISON:  CT abdomen pelvis dated Dec 30, 2019. FINDINGS: Gallbladder: Surgically absent. Common bile duct: Diameter: 5 mm, normal. Liver: No focal lesion identified. Patchy, geographic areas of increased parenchymal echogenicity. Portal vein is patent on color Doppler imaging with normal direction of blood flow towards the liver. Other: None. IMPRESSION: 1. No acute abnormality. 2. Patchy, geographic areas of increased hepatic parenchymal echogenicity, suggestive of underlying liver disease and/or steatosis. Electronically Signed   By: Obie Dredge M.D.   On: 03/01/2020 07:43   DG FLUORO GUIDED LOC OF NEEDLE/CATH TIP FOR SPINAL INJECT LT  Result Date: 02/17/2020 CLINICAL DATA:  Polyradiculopathy and neuropathy. Right leg weakness. EXAM: DIAGNOSTIC LUMBAR PUNCTURE UNDER FLUOROSCOPIC GUIDANCE FLUOROSCOPY TIME:  Fluoroscopy Time: 0 minutes 15 seconds. 15.23 micro gray meter squared PROCEDURE: Informed consent was obtained from the patient prior to the procedure, including potential complications of headache, allergy, and pain. With the patient prone, the lower back was prepped with Betadine. 1% Lidocaine was used for local anesthesia. Lumbar puncture was performed at the right L3-4 level using a 20 gauge needle with return of clear CSF with an opening pressure of 16 cm water. Eleven ml of CSF were obtained for laboratory studies. The patient tolerated the procedure well and there were no apparent complications. IMPRESSION: Lumbar puncture on the right at L4-5. Opening pressure upper range of  normal. Clear  CSF sent for requested studies. Electronically Signed   By: Nelson Chimes M.D.   On: 02/17/2020 11:50    Microbiology: Recent Results (from the past 240 hour(s))  SARS Coronavirus 2 by RT PCR (hospital order, performed in [facility] hospital lab) Nasopharyngeal Nasopharyngeal Swab     Status: None   Collection Time: 02/23/20  8:07 PM   Specimen: Nasopharyngeal Swab  Result Value Ref Range Status   SARS Coronavirus 2 NEGATIVE NEGATIVE Final    Comment: (NOTE) SARS-CoV-2 target nucleic acids are NOT DETECTED.  The SARS-CoV-2 RNA is generally detectable in upper and lower respiratory specimens during the acute phase of infection. The lowest concentration of SARS-CoV-2 viral copies this assay can detect is 250 copies / mL. A negative result does not preclude SARS-CoV-2 infection and should not be used as the sole basis for treatment or other patient management decisions.  A negative result may occur with improper specimen collection / handling, submission of specimen other than nasopharyngeal swab, presence of viral mutation(s) within the areas targeted by this assay, and inadequate number of viral copies (<250 copies / mL). A negative result must be combined with clinical observations, patient history, and epidemiological information.  Fact Sheet for Patients:   StrictlyIdeas.no  Fact Sheet for Healthcare Providers: BankingDealers.co.za  This test is not yet approved or  cleared by the [place] FDA and has been authorized for detection and/or diagnosis of SARS-CoV-2 by FDA under an Emergency Use Authorization (EUA).  This EUA will remain in effect (meaning this test can be used) for the duration of the COVID-19 declaration under Section 564(b)(1) of the Act, 21 U.S.C. section 360bbb-3(b)(1), unless the authorization is terminated or revoked sooner.  Performed at [facility], [place] [address] [place],  [place] [address]   Surgical PCR screen     Status: None   Collection Time: 02/28/20 12:25 AM   Specimen: Nasal Mucosa; Nasal Swab  Result Value Ref Range Status   MRSA, PCR NEGATIVE NEGATIVE Final   Staphylococcus aureus NEGATIVE NEGATIVE Final    Comment: (NOTE) The Xpert SA Assay (FDA approved for NASAL specimens in patients 42 years of age and older), is one component of a comprehensive surveillance program. It is not intended to diagnose infection nor to guide or monitor treatment. Performed at [facility], [place] [address], [place], [place] [address]      Labs: Basic Metabolic Panel: Recent Labs  Lab 02/26/20 0741 02/26/20 0741 02/27/20 0512 02/28/20 1010 02/29/20 0335 03/01/20 0631 03/02/20 0455  NA 135   < > 135 136 131* 135 134*  K 3.6   < > 3.9 4.2 4.4 3.9 4.4  CL 105   < > 104 104 102 102 101  CO2 21*   < > '24 25 22 25 25  '$ GLUCOSE 170*   < > 216* 159* 338* 217* 208*  BUN 15   < > '13 16 17 18 20  '$ CREATININE 0.84   < > 0.80 0.86 1.08* 0.82 0.96  CALCIUM 8.5*   < > 8.6* 9.4 8.5* 8.9 9.2  MG 1.9  --  1.9 2.0 1.8  --   --   PHOS 3.3  --  3.5 4.0 3.6  --   --    < > = values in this interval not displayed.   Liver Function Tests: Recent Labs  Lab 02/26/20 0741 02/27/20 0512 02/28/20 1010 02/29/20 0335 03/02/20 0455  AST '15 20 23 '$ 79* 22  ALT  $'17 17 20 'm$ 65* 30  ALKPHOS 100 95 117 115 110  BILITOT 0.3 <0.1* 0.6 0.5 0.6  PROT 6.9 7.1 7.9 7.6 7.4  ALBUMIN 2.7* 2.6* 2.8* 2.5* 2.6*   No results for input(s): LIPASE, AMYLASE in the last 168 hours. No results for input(s): AMMONIA in the last 168 hours. CBC: Recent Labs  Lab 02/26/20 0741 02/26/20 0741 02/27/20 0512 02/28/20 1010 02/29/20 0335 03/01/20 0631 03/02/20 0455  WBC 3.5*  --  4.0 5.5 7.2  --   --   NEUTROABS 2.1  --  2.4 3.7 5.9  --   --   HGB 10.8*   < > 10.4* 10.7* 9.7* 10.6* 10.3*  HCT 34.8*   < > 32.9* 33.6* 30.1* 33.7* 33.2*  MCV 93.8  --  94.3 93.1 94.1  --   --   PLT 139*  --  125* 143*  151  --   --    < > = values in this interval not displayed.   Cardiac Enzymes: No results for input(s): CKTOTAL, CKMB, CKMBINDEX, TROPONINI in the last 168 hours. BNP: BNP (last 3 results) No results for input(s): BNP in the last 8760 hours.  ProBNP (last 3 results) No results for input(s): PROBNP in the last 8760 hours.  CBG: Recent Labs  Lab 03/02/20 1301 03/02/20 1616 03/02/20 2110 03/03/20 0615 03/03/20 1028  GLUCAP 265* 229* 240* 176* 267*       Signed:  Camela Wich  [facility] 03/03/2020, 11:11 AM

## 2020-03-03 NOTE — Progress Notes (Signed)
Inpatient Rehabilitation Medication Review by a Pharmacist  A complete drug regimen review was completed for this patient to identify any potential clinically significant medication issues.  Clinically significant medication issues were identified:  yes   Type of Medication Issue Identified Description of Issue Urgent (address now) Non-Urgent (address on AM team rounds) Plan Plan Accepted by Provider? (Yes / No / Pending AM Rounds)  Drug Interaction(s) (clinically significant)       Duplicate Therapy       Allergy       No Medication Administration End Date       Incorrect Dose       Additional Drug Therapy Needed       Other  Metformin 1000 mg BID held Farxiga 10 mg Daily held Non-urgent-pt receiving insulin for therapy Held due to hyperglycemia-current steriod dosing-decide whether to restart on discharge. Pending future AM rounds     For non-urgent medication issues to be resolved on future team rounds  morning a [facility] Secure Chat Handoff listed in rounding tool.  Time spent performing this drug regimen review (minutes):  20 min.   Legrand Pitts 03/03/2020 4:34 PM

## 2020-03-04 ENCOUNTER — Inpatient Hospital Stay (HOSPITAL_COMMUNITY): Payer: Medicare Other

## 2020-03-04 DIAGNOSIS — E1165 Type 2 diabetes mellitus with hyperglycemia: Secondary | ICD-10-CM

## 2020-03-04 DIAGNOSIS — T402X5A Adverse effect of other opioids, initial encounter: Secondary | ICD-10-CM

## 2020-03-04 DIAGNOSIS — G610 Guillain-Barre syndrome: Secondary | ICD-10-CM

## 2020-03-04 DIAGNOSIS — R519 Headache, unspecified: Secondary | ICD-10-CM

## 2020-03-04 DIAGNOSIS — K5903 Drug induced constipation: Secondary | ICD-10-CM

## 2020-03-04 DIAGNOSIS — E871 Hypo-osmolality and hyponatremia: Secondary | ICD-10-CM

## 2020-03-04 DIAGNOSIS — D696 Thrombocytopenia, unspecified: Secondary | ICD-10-CM

## 2020-03-04 LAB — COMPREHENSIVE METABOLIC PANEL
ALT: 24 U/L (ref 0–44)
AST: 19 U/L (ref 15–41)
Albumin: 2.5 g/dL — ABNORMAL LOW (ref 3.5–5.0)
Alkaline Phosphatase: 107 U/L (ref 38–126)
Anion gap: 9 (ref 5–15)
BUN: 19 mg/dL (ref 8–23)
CO2: 24 mmol/L (ref 22–32)
Calcium: 9.2 mg/dL (ref 8.9–10.3)
Chloride: 99 mmol/L (ref 98–111)
Creatinine, Ser: 0.96 mg/dL (ref 0.44–1.00)
GFR calc Af Amer: >60 mL/min (ref >60)
GFR calc non Af Amer: >60 mL/min (ref >60)
Glucose, Bld: 268 mg/dL — ABNORMAL HIGH (ref 70–99)
Potassium: 3.6 mmol/L (ref 3.5–5.1)
Sodium: 132 mmol/L — ABNORMAL LOW (ref 135–145)
Total Bilirubin: 0.4 mg/dL (ref 0.3–1.2)
Total Protein: 7.2 g/dL (ref 6.5–8.1)

## 2020-03-04 LAB — CBC WITH DIFFERENTIAL/PLATELET
Abs Immature Granulocytes: 0.03 10*3/uL (ref 0.00–0.07)
Basophils Absolute: 0 10*3/uL (ref 0.0–0.1)
Basophils Relative: 1 %
Eosinophils Absolute: 0 10*3/uL (ref 0.0–0.5)
Eosinophils Relative: 1 %
HCT: 32.3 % — ABNORMAL LOW (ref 36.0–46.0)
Hemoglobin: 10.1 g/dL — ABNORMAL LOW (ref 12.0–15.0)
Immature Granulocytes: 1 %
Lymphocytes Relative: 16 %
Lymphs Abs: 1 10*3/uL (ref 0.7–4.0)
MCH: 28.8 pg (ref 26.0–34.0)
MCHC: 31.3 g/dL (ref 30.0–36.0)
MCV: 92 fL (ref 80.0–100.0)
Monocytes Absolute: 0.6 10*3/uL (ref 0.1–1.0)
Monocytes Relative: 9 %
Neutro Abs: 4.7 10*3/uL (ref 1.7–7.7)
Neutrophils Relative %: 72 %
Platelets: 146 10*3/uL — ABNORMAL LOW (ref 150–400)
RBC: 3.51 MIL/uL — ABNORMAL LOW (ref 3.87–5.11)
RDW: 13.6 % (ref 11.5–15.5)
WBC: 6.4 10*3/uL (ref 4.0–10.5)
nRBC: 0 % (ref 0.0–0.2)

## 2020-03-04 LAB — GLUCOSE, CAPILLARY
Glucose-Capillary: 309 mg/dL — ABNORMAL HIGH (ref 70–99)
Glucose-Capillary: 233 mg/dL — ABNORMAL HIGH (ref 70–99)
Glucose-Capillary: 180 mg/dL — ABNORMAL HIGH (ref 70–99)
Glucose-Capillary: 264 mg/dL — ABNORMAL HIGH (ref 70–99)
Glucose-Capillary: 248 mg/dL — ABNORMAL HIGH (ref 70–99)

## 2020-03-04 NOTE — Evaluation (Signed)
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 Arthritis   . Cervical facet joint syndrome 04/22/2012  . Cervicalgia   . Chronic headaches    Dr Tessa Lerner [place] [facility]  . Chronic nausea    normal GES   . Complication of anesthesia    had problem with local anesthesia-tried to assist Physician  . DM (diabetes mellitus) ([place])   . Elevated liver enzymes    hx of, normal 03/2010  . GERD (gastroesophageal reflux disease) 05/31/2011   History Schatzki's ring, erosive reflux esophagitis, status post EGD and dilation 8/11 and 11/20  . Glaucoma    lens in both eyes  . Hemorrhoids 11/17/08   Colonoscopy Dr Laural Golden  . Occipital neuralgia   . PTSD (post-traumatic stress disorder)   . Renal lithiasis   . Seizure ([place])    12 yrs ago-med related  . Torticollis   . Unspecified musculoskeletal disorders and symptoms referable to neck    cervical/trapezius   Past Surgical History:  Past Surgical History:  Procedure Laterality Date  . BREAST LUMPECTOMY     left  . CATARACT EXTRACTION W/PHACO Left 10/07/2016   Procedure: CATARACT EXTRACTION PHACO AND INTRAOCULAR LENS PLACEMENT (IOC);  Surgeon: Tonny Branch, MD;  Location: [facility];  Service: Ophthalmology;  Laterality: Left;  CDE: 4.98  . CATARACT EXTRACTION W/PHACO Right 10/21/2016   Procedure: CATARACT EXTRACTION PHACO AND INTRAOCULAR LENS PLACEMENT RIGHT EYE CDE - 6.24;  Surgeon: Tonny Branch, MD;  Location: [facility];  Service: Ophthalmology;  Laterality: Right;  right  .  CHOLECYSTECTOMY    . COLONOSCOPY  11/17/08   ext hemorrhoids  . CRANIOTOMY     secondary TBI  . CYST EXCISION     left wrist  . ESOPHAGOGASTRODUODENOSCOPY  04/05/10   [name] (6F),erosive reflux esophagitis, small hiatal hernia,  . ESOPHAGOGASTRODUODENOSCOPY (EGD) WITH PROPOFOL N/A 08/30/2019   Procedure: ESOPHAGOGASTRODUODENOSCOPY (EGD) WITH PROPOFOL;  Surgeon: Daneil Dolin, MD;  Location: [facility];  Service: Endoscopy;  Laterality: N/A;  3:30pm - pt knows to arrive at 9:45  . EXTRACORPOREAL SHOCK WAVE LITHOTRIPSY Left 12/13/2019   Procedure: EXTRACORPOREAL SHOCK WAVE LITHOTRIPSY (ESWL);  Surgeon: Festus Aloe, MD;  Location[facility];  Service: Urology;  Laterality: Left;  . FINGER SURGERY     removal cyst left pinky  . gastro empy study  03/03/10   normal  . HAND SURGERY    . MALONEY DILATION N/A 08/30/2019   Procedure: Venia Minks DILATION;  Surgeon: Daneil Dolin, MD;  Location: [facility];  Service: Endoscopy;  Laterality: N/A;  . NECK SURGERY  1998  . NEPHROLITHOTOMY  05/13/2012   Procedure: NEPHROLITHOTOMY PERCUTANEOUS;  Surgeon: Franchot Gallo, MD;  Location: [facility];  Service: Urology;  Laterality: Left;     . SURAL NERVE BX Right 02/28/2020   Procedure: SURAL NERVE / Quadricep BIOPSY;  Surgeon: Eustace Moore, MD;  Location: [facility];  Service: Neurosurgery;  Laterality: Right;    Assessment & Plan Clinical Impression: HPI: SHEQUITA PEPLINSKI is a 69 y.o  female with history of uncontrolled T2D with polyneuropathy, cervicalgia, chronic headaches, chronic pain--on methadone/Nucynta per Dr. Tessa Lerner, constipation, gait disorder with multiple falls and has had progressive weakness with inability to walk in the last 4 weeks.  History taken from chart review and patient.  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  She was started on steroids while awaiting approval for IVIG, however due to 2 issues with progressive weakness as well as severe pain and neuropathy RLE greater than LLE as well as AKI she was admitted for management.  She was started on IV fluids and empirically treated with IVIG with with improvement in weakness and sensation.  Neurology following for work-up and recommended muscle biopsy.  She underwent right sural nerve and superficial quadriceps muscle biopsy by Dr. Ronnald Ramp on 07/19--pathology still pending.  Dysesthesias RLE had greatly improved however she continues to be limited by weakness with impairments in mobility and ADLs.  She is showing improvement in activity tolerance and CIR was recommended for follow-up therapy.  Please see preadmission assessment from earlier today as well. Patient transferred to CIR on 03/03/2020 .    Patient currently requires mod- max with basic self-care skills secondary to muscle weakness, decreased cardiorespiratoy endurance and decreased standing balance, decreased postural control and decreased balance strategies.  Prior to hospitalization, patient could complete BADLs with supervision for transfers and toileting, mod A LB dressing and bathing.  Patient will benefit from skilled intervention to increase independence with basic self-care skills prior to discharge home with care partner.  Anticipate patient will require min assist- supervision and follow up home health.  OT - End of Session Activity Tolerance: Decreased this session Endurance Deficit: Yes OT Assessment Rehab Potential (ACUTE ONLY): Good OT Patient demonstrates impairments in the following area(s): Balance;Safety;Sensory;Behavior;Endurance;Motor;Pain;Perception OT Basic ADL's Functional Problem(s): Grooming;Bathing;Dressing;Toileting OT Transfers Functional Problem(s): Toilet;Tub/Shower OT Plan OT Intensity: Minimum of 1-2 x/day, 45  to 90 minutes OT Frequency: 5 out of 7 days OT Duration/Estimated Length of Stay: 12-14 days OT Treatment/Interventions: Balance/vestibular training;Neuromuscular re-education;Disease mangement/prevention;Self Care/advanced ADL retraining;Therapeutic Exercise;Wheelchair propulsion/positioning;Pain management;UE/LE Strength taining/ROM;DME/adaptive equipment instruction;Community reintegration;Functional electrical stimulation;Patient/family education;Splinting/orthotics;UE/LE Coordination activities;Discharge planning;Functional mobility training;Psychosocial support;Visual/perceptual remediation/compensation;Therapeutic Activities OT Self Feeding Anticipated Outcome(s): n/a OT Basic Self-Care Anticipated Outcome(s): min A overall OT Toileting Anticipated Outcome(s): supervision OT Bathroom Transfers Anticipated Outcome(s): supervision OT Recommendation Recommendations for Other Services: Therapeutic Recreation consult Therapeutic Recreation Interventions: Stress management Patient destination: Home Follow Up Recommendations: Home health OT Equipment Recommended: To be determined   OT Evaluation Precautions/Restrictions  Precautions Precautions: Fall Restrictions Weight Bearing Restrictions: No General   Vital Signs  Pain Pain Assessment Pain Scale: 0-10 Pain Score: 9  Pain Type: Acute pain Pain Location: Hip Pain Orientation: Right Pain Descriptors / Indicators: Aching Pain Frequency: Constant Pain Onset: On-going Patients Stated Pain Goal: 2 Pain Intervention(s): Medication (See eMAR) Home Living/Prior [place] expects to be discharged to:: Private residence Living Arrangements: Spouse/significant other Available Help at Discharge: Family Type of Home: House Home Access: Stairs to enter Technical brewer of Steps: 4-5 Entrance Stairs-Rails: Right Home Layout: Two level Alternate Level Stairs-Number of Steps: shower in basement and  upstairs  Lives With: Spouse IADL History Homemaking Responsibilities: No Prior Function Level of Independence: Needs assistance with ADLs  Able to Take Stairs?: Yes Vision Baseline Vision/History: No visual deficits Patient Visual Report: No change from baseline Vision Assessment?: No  apparent visual deficits Perception  Perception: Within Functional Limits Praxis Praxis: Intact Cognition Overall Cognitive Status: Within Functional Limits for tasks assessed Arousal/Alertness: Awake/alert Orientation Level: Person;Place;Situation Person: Oriented Place: Oriented Situation: Oriented Year: 2021 Month: July Day of Week: Correct Memory: Appears intact Immediate Memory Recall: Sock;Blue;Bed Memory Recall Sock: Without Cue Memory Recall Blue: Without Cue Memory Recall Bed: Without Cue Attention: Sustained;Alternating Sustained Attention: Appears intact Awareness: Appears intact Problem Solving: Appears intact Safety/Judgment: Appears intact Comments: fear of falling/anxiety Sensation Sensation Light Touch: Impaired by gross assessment Light Touch Impaired Details: Impaired LLE;Impaired RLE;Impaired LUE;Impaired RUE Additional Comments: decreased sensation in bilateral thumbs Coordination Gross Motor Movements are Fluid and Coordinated: No Fine Motor Movements are Fluid and Coordinated: Yes Motor  Motor Motor: Abnormal tone  Trunk/Postural Assessment     Balance Balance Balance Assessed: Yes Dynamic Sitting Balance Sitting balance - Comments: min A with small movements during ADLs Static Standing Balance Static Standing - Balance Support: Bilateral upper extremity supported Static Standing - Level of Assistance: 5: Stand by assistance Extremity/Trunk Assessment RUE Assessment RUE Assessment: Within Functional Limits General Strength Comments: 4/5 LUE Assessment LUE Assessment: Within Functional Limits Active Range of Motion (AROM) Comments: pain in L shoulder  however able to acheive ROM WFL General Strength Comments: 4/5  Care Tool Care Tool Self Care Eating        Oral Care         Bathing              Upper Body Dressing(including orthotics)            Lower Body Dressing (excluding footwear)          Putting on/Taking off footwear             Care Tool Toileting Toileting activity         Care Tool Bed Mobility Roll left and right activity        Sit to lying activity        Lying to sitting edge of bed activity         Care Tool Transfers Sit to stand transfer        Chair/bed transfer         Toilet transfer         Care Tool Cognition Expression of Ideas and Wants     Understanding Verbal and Non-Verbal Content     Memory/Recall Ability *first 3 days only      Refer to Care Plan for Long Term Goals  SHORT TERM GOAL WEEK 1 OT Short Term Goal 1 (Week 1): Pt will complete toilet transfer with min assist. OT Short Term Goal 2 (Week 1): Pt will utilize AE to complete LB dressing with mod A OT Short Term Goal 3 (Week 1): Pt will engage in functional task in standing for 2 minutes to incresae endurance for ADLs.  Recommendations for other services: Therapeutic Recreation  Stress management   Skilled Therapeutic Intervention Session 1: OT evaluation completed. Discussed role of OT, safety plan, fall risk, potential DME, and goals. Pt requested to sponge bathe for energy conservation on this date. Pt completed stand pivot transfer bed>w/c with mod A to stand and min A to pivot. Completed sit<>stand 3x with min A, however required max A at end of session due to fatigue. OT provided total assist for LB dressing. At end of session, pt left supine in bed with all needs in reach.   Session 2: OT session focused on functional  transfers, pain management, and education. Pt received supine in bed reporting fatigue, however agreeable to therapy. Presented Stedy with education and demonstration for transfers  in/out of bed and toileting. Pt eager to utilize, completing supine>sit with min A. Completed sit<>stand to stedy with bed elevated and min assist. Max verbal/tactile cues for upright posture. Transitioned to [facility] with pt demonstrating increased anxiety and L knee pain. Pt attempted to stand in stedy 3x, however poor tolerance noted. Requested to transfer back to bed, completing sit>stand in stedy with mod A and max A to transition sit>supine. OT provided ice pack for pain management.   ADL   Mobility  Bed Mobility Bed Mobility: Supine to Sit Supine to Sit: Minimal Assistance - Patient > 75% Transfers Sit to Stand: Moderate Assistance - Patient 50-74%;Minimal Assistance - Patient > 75%   Discharge Criteria: Patient will be discharged from OT if patient refuses treatment 3 consecutive times without medical reason, if treatment goals not met, if there is a change in medical status, if patient makes no progress towards goals or if patient is discharged from hospital.  The above assessment, treatment plan, treatment alternatives and goals were discussed and mutually agreed upon: by patient  Perkinson, Quillian Quince 03/04/2020, 9:30 AM

## 2020-03-04 NOTE — Progress Notes (Signed)
[facility] PHYSICAL MEDICINE & REHABILITATION PROGRESS NOTE  Subjective/Complaints: Patient seen sitting up in bed this morning, about to work with therapies.  She states she slept well overnight.  She states she is ready begin therapies.  ROS: Denies CP, shortness of breath, nausea, vomiting, diarrhea.  Objective: Vital Signs: Blood pressure 128/75, pulse 90, temperature (!) 97.5 F (36.4 C), temperature source Oral, resp. rate 16, height 5\' 4"  (1.626 m), weight 57.8 kg, SpO2 99 %. No results found. Recent Labs    03/03/20 1252 03/04/20 0622  WBC  --  6.4  HGB 10.7* 10.1*  HCT 33.7* 32.3*  PLT  --  146*   Recent Labs    03/03/20 1252 03/04/20 0622  NA 133* 132*  K 4.5 3.6  CL 99 99  CO2 27 24  GLUCOSE 280* 268*  BUN 20 19  CREATININE 0.95 0.96  CALCIUM 9.3 9.2    Physical Exam: BP 128/75 (BP Location: Right Arm)   Pulse 90   Temp (!) 97.5 F (36.4 C) (Oral)   Resp 16   Ht 5\' 4"  (1.626 m)   Wt 57.8 kg   SpO2 99%   BMI 21.87 kg/m  Constitutional: No distress . Vital signs reviewed. HENT: Normocephalic.  Atraumatic. Eyes: EOMI. No discharge. Cardiovascular: No JVD. Respiratory: Normal effort.  No stridor. GI: Non-distended. Skin: Warm and dry.  Intact. Psych: Normal mood.  Normal behavior. Musc: No edema in extremities.  No tenderness in extremities. Neuro: Alert Motor: Right lower extremity: Hip flexion, knee extension 0/5, ankle dorsiflexion 3/5 Left lower extremity: Hip flexion, knee extension 4+/5, ankle dorsiflexion 0/5  Assessment/Plan: 1. 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  Physiatrist is providing close team supervision and 24 hour management of active medical problems listed below.  Physiatrist and rehab team continue to assess barriers to discharge/monitor patient progress toward functional and medical  goals  Care Tool:  Bathing    Body parts bathed by patient: Right arm, Left arm, Chest, Abdomen, Face, Right upper leg, Left upper leg   Body parts bathed by helper: Right lower leg, Left lower leg, Front perineal area, Buttocks     Bathing assist Assist Level: Moderate Assistance - Patient 50 - 74%     Upper Body Dressing/Undressing Upper body dressing   What is the patient wearing?: Pull over shirt    Upper body assist Assist Level: Set up assist    Lower Body Dressing/Undressing Lower body dressing      What is the patient wearing?: Underwear/pull up, Pants     Lower body assist Assist for lower body dressing: Total Assistance - Patient < 25%     Toileting Toileting    Toileting assist Assist for toileting: Moderate Assistance - Patient 50 - 74%     Transfers Chair/bed transfer  Transfers assist  Chair/bed transfer activity did not occur: Refused (due to 10/10 pain and fatigue)  Chair/bed transfer assist level: Moderate Assistance - Patient 50 - 74%     Locomotion Ambulation   Ambulation assist   Ambulation activity did not occur: Refused (due to 10/10 pain and fatigue)          Walk 10 feet activity   Assist  Walk 10 feet activity did not occur: Refused (due to 10/10 pain and fatigue)        Walk 50 feet activity   Assist Walk 50 feet with 2 turns activity  did not occur: Refused (due to 10/10 pain and fatigue)         Walk 150 feet activity   Assist Walk 150 feet activity did not occur: Refused (due to 10/10 pain and fatigue)         Walk 10 feet on uneven surface  activity   Assist Walk 10 feet on uneven surfaces activity did not occur: Refused (due to 10/10 pain and fatigue)         Wheelchair     Assist Will patient use wheelchair at discharge?: Yes Type of Wheelchair: Manual Wheelchair activity did not occur: Refused (due to 10/10 pain and fatigue)         Wheelchair 50 feet with 2 turns  activity    Assist    Wheelchair 50 feet with 2 turns activity did not occur: Refused (due to 10/10 pain and fatigue)       Wheelchair 150 feet activity     Assist Wheelchair 150 feet activity did not occur: Refused (due to 10/10 pain and fatigue)         Medical Problem List and Plan: 1. 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  Begin CIR evaluations 2.  Antithrombotics: -DVT/anticoagulation:  Pharmaceutical: Lovenox             -antiplatelet therapy: N/A 3. Chronic neck/back/Pain Management: Continue Methadone 20 mg tid, Pamelor 25 mg/hs and Nuycnta prn. Tizanidine tid prn for spasms.    Monitor with increased exertion 4.  Mood: LCSW to follow for evaluation ad support.              -antipsychotic agents: N/A 5. Neuropsych: This patient is capable of making decisions on her own behalf. 6. Skin/Wound Care: Routine pressure relief measures.  7. Fluids/Electrolytes/Nutrition: Monitor I/O.   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  9.T2DM with hyperglycemia-poorly controlled: A1c- 8.6. Continue Levemir 5 units BID--increased meal coverage to 10 units tic ac             Monitor with increased mobility 10. Normocytic anemia: Stable overall. Monitor for signs of infection.              Hemoglobin 10.1 on 7/24  Continue to monitor 11. Low calorie malnutrition: Continue supplement 12. Hyponatremia/ chronic Hypomagnesemia:   Sodium 132 on 7/24, labs ordered for Monday  Mag ordered for Monday  13. Abnormal LFTs: Resolved 14.  Drug-induced constipation: Dulcolax prn effective.   Increased bowel meds as necessary 15. Persistent daily HA: On topamax 100 mg/day.   Monitor with increased activity 16.  Thrombocytopenia  Platelets 146 on 7/24, labs ordered for Monday   LOS: 1 days A FACE TO FACE EVALUATION WAS PERFORMED  Lockie Bothun Karis Juba 03/04/2020, 2:05 PM

## 2020-03-04 NOTE — Evaluation (Signed)
Physical Therapy Assessment and Plan  Patient Details  Name: Monica [name] MRN: [id number] Date of Birth: October 07, 1950  PT Diagnosis: Abnormal posture, Difficulty walking, Muscle weakness and Pain in bilateral LEs Rehab Potential: Fair ELOS: 14-18 days   Today's Date: 03/04/2020 PT Individual Time: 1100-1145 PT Individual Time Calculation (min): 45 min  and Today's Date: 03/04/2020 PT Missed Time: 15 Minutes Missed Time Reason: Patient fatigue;Pain   Hospital Problem: Principal Problem:   Polyradiculoneuropathy [facility])   Past Medical History:  Past Medical History:  Diagnosis Date  . Arthritis   . Cervical facet joint syndrome 04/22/2012  . Cervicalgia   . Chronic headaches    Dr Monica [name] Pain Specialist  . Chronic nausea    normal GES   . Complication of anesthesia    had problem with local anesthesia-tried to assist Physician  . DM (diabetes mellitus) ([place])   . Elevated liver enzymes    hx of, normal 03/2010  . GERD (gastroesophageal reflux disease) 05/31/2011   History Schatzki's ring, erosive reflux esophagitis, status post EGD and dilation 8/11 and 11/20  . Glaucoma    lens in both eyes  . Hemorrhoids 11/17/08   Colonoscopy Dr Laural Golden  . Occipital neuralgia   . PTSD (post-traumatic stress disorder)   . Renal lithiasis   . Seizure ([place])    12 yrs ago-med related  . Torticollis   . Unspecified musculoskeletal disorders and symptoms referable to neck    cervical/trapezius   Past Surgical History:  Past Surgical History:  Procedure Laterality Date  . BREAST LUMPECTOMY     left  . CATARACT EXTRACTION W/PHACO Left 10/07/2016   Procedure: CATARACT EXTRACTION PHACO AND INTRAOCULAR LENS PLACEMENT (IOC);  Surgeon: Monica Branch, MD;  Location: [facility];  Service: Ophthalmology;  Laterality: Left;  CDE: 4.98  . CATARACT EXTRACTION W/PHACO Right 10/21/2016   Procedure: CATARACT EXTRACTION PHACO AND INTRAOCULAR LENS PLACEMENT RIGHT EYE CDE - 6.24;  Surgeon: Monica Branch, MD;   Location: [facility];  Service: Ophthalmology;  Laterality: Right;  right  . CHOLECYSTECTOMY    . COLONOSCOPY  11/17/08   ext hemorrhoids  . CRANIOTOMY     secondary TBI  . CYST EXCISION     left wrist  . ESOPHAGOGASTRODUODENOSCOPY  04/05/10   [name] (25F),erosive reflux esophagitis, small hiatal hernia,  . ESOPHAGOGASTRODUODENOSCOPY (EGD) WITH PROPOFOL N/A 08/30/2019   Procedure: ESOPHAGOGASTRODUODENOSCOPY (EGD) WITH PROPOFOL;  Surgeon: Monica Dolin, MD;  Location: [facility];  Service: Endoscopy;  Laterality: N/A;  3:30pm - pt knows to arrive at 9:45  . EXTRACORPOREAL SHOCK WAVE LITHOTRIPSY Left 12/13/2019   Procedure: EXTRACORPOREAL SHOCK WAVE LITHOTRIPSY (ESWL);  Surgeon: Monica Aloe, MD;  Location: [facility];  Service: Urology;  Laterality: Left;  . FINGER SURGERY     removal cyst left pinky  . gastro empy study  03/03/10   normal  . HAND SURGERY    . MALONEY DILATION N/A 08/30/2019   Procedure: Venia Minks DILATION;  Surgeon: Monica Dolin, MD;  Location: [facility];  Service: Endoscopy;  Laterality: N/A;  . NECK SURGERY  1998  . NEPHROLITHOTOMY  05/13/2012   Procedure: NEPHROLITHOTOMY PERCUTANEOUS;  Surgeon: Monica Gallo, MD;  Location: [facility];  Service: Urology;  Laterality: Left;     . SURAL NERVE BX Right 02/28/2020   Procedure: SURAL NERVE / Quadricep BIOPSY;  Surgeon: Monica Moore, MD;  Location: [facility];  Service: Neurosurgery;  Laterality: Right;    Assessment & Plan Clinical  Impression: Patient is a 69 y.o. year old female with history of uncontrolled T2DM with polyneuropathy, cervicalgia, chronic HA, chronic pain- on Methadone/Nucynta(Dr. Naaman [name]), constipation(goes once a week),gait disorder with multiple falls. Work up done revealing R-L3 and R-L5 radiculopathy, concomitant sensorimotor polyneuropathy as well as LP showing significant elevated CSF protein concerning for inflammatory polyradiculoneuropathy and was started on  steroids while awaiting approval for IVIG recommended. She was admitted via Neurology office on 02/23/20 due to increase in weakness and pain for the past 2-3 weeks with numbness/tingling in feel and AKI. She was started on IVF and empirically treated with IVIG with improvement in weakness as well as sensation. She underwent right sural nerve and superficial quadriceps muscle biopsy by Dr. Ronnald [name] on 07/19-->path pending.   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 Abdominal ultrasound done due to abnormal LFTs and showed increased echogenicity suggestive or underlying liver disease and/or steatosis. Hgb A1c- 10.3 and blood sugars remain labile. Pain control improving --has not used IV morphine X 24 hours. use. Has been refusing laxative to manage constipation. Therapy ongoing and patient now showing improvement in activity tolerance. She has had functional decline in the past month and CIR recommended for follow up therapy.   Patient currently requires mod with mobility secondary to muscle weakness and decreased sitting balance, decreased standing balance, decreased postural control and decreased balance strategies.  Prior to hospitalization, patient was min with mobility and lived with Spouse in a House home.  Home access is 4-5Stairs to enter.  Patient will benefit from skilled PT intervention to maximize safe functional mobility, minimize fall risk and decrease caregiver burden for planned discharge home with 24 hour assist.  Anticipate patient will benefit from follow up [facility] at discharge.  PT - End of Session Activity Tolerance: Tolerates 30+ min activity with multiple rests Endurance Deficit: Yes Endurance Deficit Description: pt lethargic and falling asleep throughout session; required increased time for all tasks PT Assessment Rehab Potential (ACUTE/IP ONLY): Fair PT Barriers to Discharge: [place] home environment;Home  environment access/layout;Wound Care;Other (comments) PT Barriers to Discharge Comments: high pain levels, 4-5 STE with 1 rail, LE weakness PT Patient demonstrates impairments in the following area(s): Balance;Endurance;Motor;Pain;Skin Integrity PT Transfers Functional Problem(s): Bed Mobility;Bed to Chair;Car;Furniture PT Locomotion Functional Problem(s): Ambulation;Wheelchair Mobility;Stairs PT Plan PT Intensity: Minimum of 1-2 x/day ,45 to 90 minutes PT Frequency: 5 out of 7 days PT Duration Estimated Length of Stay: 14-18 days PT Treatment/Interventions: Ambulation/gait training;Discharge planning;Functional mobility training;Psychosocial support;Therapeutic Activities;Balance/vestibular training;Disease management/prevention;Neuromuscular re-education;Skin care/wound management;Therapeutic Exercise;Wheelchair propulsion/positioning;DME/adaptive equipment instruction;Pain management;Splinting/orthotics;UE/LE Strength taining/ROM;Community reintegration;Functional electrical stimulation;Patient/family education;Stair training;UE/LE Coordination activities PT Transfers Anticipated Outcome(s): supervision with LRAD PT Locomotion Anticipated Outcome(s): CGA with LRAD PT Recommendation Follow Up Recommendations: Home health PT Patient destination: Home Equipment Recommended: To be determined Equipment Details: has RW, WC, and SPC  PT Evaluation Precautions/Restrictions Precautions Precautions: Fall Precaution Comments: weak and unsteady RLE Restrictions Weight Bearing Restrictions: No  Home Living/Prior Functioning Home Living Available Help at Discharge: Family Type of Home: House Home Access: Stairs to enter [employer] of Steps: 4-5 Entrance Stairs-Rails: Right;Left (cant reach both) Home Layout: Multi-level;Able to live on main level with bedroom/bathroom Alternate Level Stairs-Number of Steps: 14-15 steps Alternate Level Stairs-Rails: Left Bathroom Accessibility:  Yes  Lives With: Spouse Prior Function Level of Independence: Needs assistance with ADLs;Needs assistance with tranfers;Needs assistance with homemaking;Needs assistance with gait  Able to Take Stairs?: No Driving: No 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 Comments: husband was assisting with all iADLs including transfers and mobility Cognition Overall Cognitive Status: Within Functional Limits for tasks assessed Arousal/Alertness: Lethargic Orientation Level: Oriented X4 Memory: Appears intact Awareness: Appears intact Problem Solving: Appears intact Safety/Judgment: Appears intact Comments: high pain levels and lethargic; frequently falling asleep throughout session Sensation Sensation Light Touch: Appears Intact Light Touch Impaired Details: Impaired LLE;Impaired RLE;Impaired LUE;Impaired RUE Proprioception: Appears Intact Additional Comments: pt required increased time to communicate with therapist due to fatigue Coordination Gross Motor Movements are Fluid and Coordinated: No Fine Motor Movements are Fluid and Coordinated: Yes Coordination and Movement Description: grossly uncoordinated due to high pain levels, LE weakness, decreased postural control Finger Nose Finger Test: [facility] but slow Heel Shin Test: unable to perform bilaterally due to pain and LE weakness Motor  Motor Motor: Abnormal postural alignment and control Motor - Skilled Clinical Observations: grossly uncoordinated due to high pain levels, LE weakness, decreased postural control  Trunk/Postural Assessment  Cervical Assessment Cervical Assessment: Exceptions to [facility] (forward head) Thoracic Assessment Thoracic Assessment: Exceptions to [facility] (kyphosis) Lumbar Assessment Lumbar Assessment: Exceptions to [facility] (posterior pelvic tilt) Postural Control Postural Control: Deficits on evaluation  Balance Balance Balance Assessed: Yes Static Sitting Balance Static Sitting - Balance  Support: Bilateral upper extremity supported;Feet supported Static Sitting - Level of Assistance: 5: Stand by assistance (supervision) Dynamic Sitting Balance Dynamic Sitting - Balance Support: Bilateral upper extremity supported;Feet supported Dynamic Sitting - Level of Assistance: 5: Stand by assistance (supervision) Sitting balance - Comments: min A with small movements during ADLs Static Standing Balance Static Standing - Balance Support: Bilateral upper extremity supported Static Standing - Level of Assistance: 5: Stand by assistance Extremity Assessment  RLE Assessment RLE Assessment: Exceptions to [facility] RLE Strength Right Hip Flexion: 3/5 Right Hip ABduction: 3/5 Right Hip ADduction: 3+/5 Right Knee Flexion: 3/5 Right Knee Extension: 2/5 Right Ankle Dorsiflexion: 2/5 Right Ankle Plantar Flexion: 3+/5 LLE Assessment LLE Assessment: Exceptions to [facility] LLE Strength Left Hip Flexion: 3+/5 Left Hip ABduction: 3+/5 Left Hip ADduction: 3+/5 Left Knee Flexion: 3+/5 Left Knee Extension: 4-/5 Left Ankle Dorsiflexion: 2/5 Left Ankle Plantar Flexion: 3+/5  Care Tool Care Tool Bed Mobility Roll left and right activity   Roll left and right assist level: Minimal Assistance - Patient > 75%    Sit to lying activity   Sit to lying assist level: Moderate Assistance - Patient 50 - 74%    Lying to sitting edge of bed activity   Lying to sitting edge of bed assist level: Moderate Assistance - Patient 50 - 74%     Care Tool Transfers Sit to stand transfer  Refused (due to 10/10 pain and fatigue)  Chair/bed transfer   Refused (due to 10/10 pain and fatigue)     Toilet transfer        Car transfer      Refused (due to 10/10 pain and fatigue)      Care Tool Locomotion Ambulation    Refused (due to 10/10 pain and fatigue)        Walk 10 feet activity    Refused (due to 10/10 pain and fatigue)       Walk 50 feet with 2 turns activity    Refused (due to 10/10 pain and  fatigue)      Walk 150 feet activity    Refused (due to 10/10 pain and fatigue)      Walk 10 feet on uneven surfaces activity    Refused (due to  10/10 pain and fatigue)      Stairs    Refused (due to 10/10 pain and fatigue)        Walk up/down 1 step activity    Refused (due to 10/10 pain and fatigue)        Walk up/down 4 steps activity    Refused (due to 10/10 pain and fatigue)    Walk up/down 12 steps activity    Refused (due to 10/10 pain and fatigue)      Pick up small objects from floor    Refused (due to 10/10 pain and fatigue)      Wheelchair    Refused (due to 10/10 pain and fatigue)          Wheel 50 feet with 2 turns activity    Refused (due to 10/10 pain and fatigue)    Wheel 150 feet activity    Refused (due to 10/10 pain and fatigue)      Refer to Care Plan for Long Term Goals  SHORT TERM GOAL WEEK 1 PT Short Term Goal 1 (Week 1): pt will perform bed mobility with min A PT Short Term Goal 2 (Week 1): Pt will transfer sit<>stand with LRAD mod A PT Short Term Goal 3 (Week 1): Pt will transfer bed<>WC with LRAD mod A  Recommendations for other services: None   Skilled Therapeutic Intervention Evaluation completed (see details above and below) with education on PT POC and goals and individual treatment initiated with focus on functional mobility/transfers, generalized strengthening, dynamic sitting balance/coordination, and improved activity tolerance. Received pt supine in bed asleep, upon wakening pt extremely lethargic and with difficulty keeping eyes open. Pt educated on PT evaluation, CIR policies, and therapy schedule and agreeable. Pt reported 10/10 pain in R hip and along incision (premedicated). RN notified and present reporting that pt had received all the medication she is able to have. Session limited by high pain levels and fatigue as evidenced by pt frequently falling asleep during session and required significantly increased time with mobility  and answering therapist's questions. Pt crying out in pain with any mobility and with light touch to R and L LE when assessing sensation. Pt transferred supine<>sitting EOB with HOB elevated and use of bedrails with significantly increased time and mod A for LE management; pt requesting that therapist assist with moving LEs. Pt reported cramping in R hip and L shoulder with mobility. Pt able to maintain static sitting balance with and without UE support with close supervision for approximately 5 minutes. NT present to check glucose levels. Pt refused standing due to 10/10 pain and fatigue stating "I just can't do it right now". Therapist suggested raising bed and using Stedy however pt continued to refuse. Pt transferred sit<>supine with mod A for LE management and increased time with rest breaks due to pain. Therapist assisted with reporsioning in bed for comfort with pillow underneath bilateral LEs and ice pack to L knee. Concluded session with pt supine in bed, needs within reach, and bed alarm on. 15 minutes missed of skilled physical therapy due to pain/fatigue.  Mobility Bed Mobility Bed Mobility: Rolling Right;Rolling Left;Sit to Supine;Supine to Sit Rolling Right: Minimal Assistance - Patient > 75% Rolling Left: Minimal Assistance - Patient > 75% Supine to Sit: Moderate Assistance - Patient 50-74% Sit to Supine: Moderate Assistance - Patient 50-74% Locomotion  Gait Ambulation: No Stairs / Additional Locomotion Stairs: No Wheelchair Mobility Wheelchair Mobility: No  Discharge Criteria: Patient will be discharged from PT  if patient refuses treatment 3 consecutive times without medical reason, if treatment goals not met, if there is a change in medical status, if patient makes no progress towards goals or if patient is discharged from hospital.  The above assessment, treatment plan, treatment alternatives and goals were discussed and mutually agreed upon: by patient  Alfonse Alpers PT, DPT  03/04/2020, 7:36 AM

## 2020-03-05 DIAGNOSIS — D649 Anemia, unspecified: Secondary | ICD-10-CM

## 2020-03-05 LAB — GLUCOSE, CAPILLARY
Glucose-Capillary: 189 mg/dL — ABNORMAL HIGH (ref 70–99)
Glucose-Capillary: 273 mg/dL — ABNORMAL HIGH (ref 70–99)
Glucose-Capillary: 326 mg/dL — ABNORMAL HIGH (ref 70–99)
Glucose-Capillary: 276 mg/dL — ABNORMAL HIGH (ref 70–99)

## 2020-03-05 MED ORDER — INSULIN DETEMIR 100 UNIT/ML ~~LOC~~ SOLN
8.0000 [IU] | Freq: Two times a day (BID) | SUBCUTANEOUS | Status: DC
  Administered 2020-03-05 – 2020-03-06 (×2): 8 [IU] via SUBCUTANEOUS
  Filled 2020-03-05 (×3): qty 0.08

## 2020-03-05 NOTE — Progress Notes (Signed)
[place] PHYSICAL MEDICINE & REHABILITATION PROGRESS NOTE  Subjective/Complaints: Patient seen sitting up in bed this morning.  She states she slept well overnight.  She states she had a tiring first day of therapies yesterday.  ROS: Denies CP, shortness of breath, nausea, vomiting, diarrhea.  Objective: Vital Signs: Blood pressure 121/67, pulse 81, temperature 98.1 F (36.7 C), resp. rate 19, height 5\' 4"  (1.626 m), weight 57.8 kg, SpO2 99 %. No results found. Recent Labs    03/03/20 1252 03/04/20 0622  WBC  --  6.4  HGB 10.7* 10.1*  HCT 33.7* 32.3*  PLT  --  146*   Recent Labs    03/03/20 1252 03/04/20 0622  NA 133* 132*  K 4.5 3.6  CL 99 99  CO2 27 24  GLUCOSE 280* 268*  BUN 20 19  CREATININE 0.95 0.96  CALCIUM 9.3 9.2    Physical Exam: BP 121/67 (BP Location: Left Arm)   Pulse 81   Temp 98.1 F (36.7 C)   Resp 19   Ht 5\' 4"  (1.626 m)   Wt 57.8 kg   SpO2 99%   BMI 21.87 kg/m   Constitutional: No distress . Vital signs reviewed. HENT: Normocephalic.  Atraumatic. Eyes: EOMI. No discharge. Cardiovascular: No JVD. Respiratory: Normal effort.  No stridor. GI: Non-distended. Skin: Right lateral thigh healing Psych: Normal mood.  Normal behavior. Musc: No edema in extremities.  No tenderness in extremities. Neuro: Alert Motor: Right lower extremity: Hip flexion, knee extension 0/5, ankle dorsiflexion 3/5 Left lower extremity: Hip flexion, knee extension 4+/5, wiggles toes  Assessment/Plan: 1. 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  Physiatrist is providing close team supervision and 24 hour management of active medical problems listed below.  Physiatrist and rehab team continue to assess barriers to discharge/monitor patient progress toward functional and medical goals  Care Tool:  Bathing    Body parts bathed by patient:  Right arm, Left arm, Chest, Abdomen, Face, Right upper leg, Left upper leg   Body parts bathed by helper: Right lower leg, Left lower leg, Front perineal area, Buttocks     Bathing assist Assist Level: Moderate Assistance - Patient 50 - 74%     Upper Body Dressing/Undressing Upper body dressing   What is the patient wearing?: Hospital gown only    Upper body assist Assist Level: Contact Guard/Touching assist    Lower Body Dressing/Undressing Lower body dressing      What is the patient wearing?: Underwear/pull up     Lower body assist Assist for lower body dressing: Total Assistance - Patient < 25%     Toileting Toileting    Toileting assist Assist for toileting: 2 Helpers     Transfers Chair/bed transfer  Transfers assist  Chair/bed transfer activity did not occur: Refused (due to 10/10 pain and fatigue)  Chair/bed transfer assist level: Moderate Assistance - Patient 50 - 74%     Locomotion Ambulation   Ambulation assist   Ambulation activity did not occur: Refused (due to 10/10 pain and fatigue)          Walk 10 feet activity   Assist  Walk 10 feet activity did not occur: Refused (due to 10/10 pain and fatigue)        Walk 50 feet activity   Assist Walk 50 feet with 2 turns activity did not occur: Refused (due to 10/10 pain and fatigue)  Walk 150 feet activity   Assist Walk 150 feet activity did not occur: Refused (due to 10/10 pain and fatigue)         Walk 10 feet on uneven surface  activity   Assist Walk 10 feet on uneven surfaces activity did not occur: Refused (due to 10/10 pain and fatigue)         Wheelchair     Assist Will patient use wheelchair at discharge?: Yes Type of Wheelchair: Manual Wheelchair activity did not occur: Refused (due to 10/10 pain and fatigue)         Wheelchair 50 feet with 2 turns activity    Assist    Wheelchair 50 feet with 2 turns activity did not occur: Refused (due  to 10/10 pain and fatigue)       Wheelchair 150 feet activity     Assist Wheelchair 150 feet activity did not occur: Refused (due to 10/10 pain and fatigue)         Medical Problem List and Plan: 1. 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  Continue CIR 2.  Antithrombotics: -DVT/anticoagulation:  Pharmaceutical: Lovenox             -antiplatelet therapy: N/A 3. Chronic neck/back/Pain Management: Continue Methadone 20 mg tid, Pamelor 25 mg/hs and Nuycnta prn. Tizanidine tid prn for spasms.    Relatively controlled with meds on 7/25  Monitor with increased exertion 4.  Mood: LCSW to follow for evaluation ad support.              -antipsychotic agents: N/A 5. Neuropsych: This patient is capable of making decisions on her own behalf. 6. Skin/Wound Care: Routine pressure relief measures.  7. Fluids/Electrolytes/Nutrition: Monitor I/O.   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  9.T2DM with hyperglycemia-poorly controlled: A1c- 8.6.   Levemir 5 units BID, increased to 8 BID on 7/25  Novolog 5 units tic ac             Monitor with increased mobility 10. Normocytic anemia: Stable overall. Monitor for signs of infection.              Hemoglobin 10.1 on 7/24  Continue to monitor 11. Low calorie malnutrition: Continue supplement 12. Hyponatremia/ chronic Hypomagnesemia:   Sodium 132 on 7/24, labs ordered for tomorrow  Mag ordered for tomorrow 13. Abnormal LFTs: Resolved 14.  Drug-induced constipation: Dulcolax prn effective.   Improving  Increased bowel meds as necessary 15. Persistent daily HA: On topamax 100 mg/day.   Monitor with increased activity 16.  Thrombocytopenia  Platelets 146 on 7/24, labs ordered for tomorrow   LOS: 2 days A FACE TO FACE EVALUATION WAS PERFORMED  Monica [name] Monica [name] 03/05/2020, 1:06 PM

## 2020-03-06 ENCOUNTER — Inpatient Hospital Stay (HOSPITAL_COMMUNITY): Payer: Medicare Other | Admitting: Physical Therapy

## 2020-03-06 ENCOUNTER — Inpatient Hospital Stay (HOSPITAL_COMMUNITY): Payer: Medicare Other | Admitting: Occupational Therapy

## 2020-03-06 LAB — CBC
HCT: 32.2 % — ABNORMAL LOW (ref 36.0–46.0)
Hemoglobin: 10.2 g/dL — ABNORMAL LOW (ref 12.0–15.0)
MCH: 29 pg (ref 26.0–34.0)
MCHC: 31.7 g/dL (ref 30.0–36.0)
MCV: 91.5 fL (ref 80.0–100.0)
Platelets: 172 10*3/uL (ref 150–400)
RBC: 3.52 MIL/uL — ABNORMAL LOW (ref 3.87–5.11)
RDW: 13.3 % (ref 11.5–15.5)
WBC: 5.5 10*3/uL (ref 4.0–10.5)
nRBC: 0 % (ref 0.0–0.2)

## 2020-03-06 LAB — BASIC METABOLIC PANEL
Anion gap: 9 (ref 5–15)
BUN: 17 mg/dL (ref 8–23)
CO2: 26 mmol/L (ref 22–32)
Calcium: 9.4 mg/dL (ref 8.9–10.3)
Chloride: 97 mmol/L — ABNORMAL LOW (ref 98–111)
Creatinine, Ser: 0.95 mg/dL (ref 0.44–1.00)
GFR calc Af Amer: >60 mL/min (ref >60)
GFR calc non Af Amer: >60 mL/min (ref >60)
Glucose, Bld: 319 mg/dL — ABNORMAL HIGH (ref 70–99)
Potassium: 3.5 mmol/L (ref 3.5–5.1)
Sodium: 132 mmol/L — ABNORMAL LOW (ref 135–145)

## 2020-03-06 LAB — GLUCOSE, CAPILLARY
Glucose-Capillary: 230 mg/dL — ABNORMAL HIGH (ref 70–99)
Glucose-Capillary: 288 mg/dL — ABNORMAL HIGH (ref 70–99)
Glucose-Capillary: 275 mg/dL — ABNORMAL HIGH (ref 70–99)
Glucose-Capillary: 242 mg/dL — ABNORMAL HIGH (ref 70–99)

## 2020-03-06 LAB — MAGNESIUM: Magnesium: 1.8 mg/dL (ref 1.7–2.4)

## 2020-03-06 MED ORDER — INSULIN DETEMIR 100 UNIT/ML ~~LOC~~ SOLN
10.0000 [IU] | Freq: Two times a day (BID) | SUBCUTANEOUS | Status: DC
  Administered 2020-03-06 – 2020-03-07 (×2): 10 [IU] via SUBCUTANEOUS
  Filled 2020-03-06 (×5): qty 0.1

## 2020-03-06 NOTE — Progress Notes (Signed)
Patient arrived to floor, patient was alert and oriented, denies pain. Plan of care reviewed and patient made aware of safety plan and oriented to floor. Skin checked. Will continue to monitor.    ** late entry**

## 2020-03-06 NOTE — Plan of Care (Signed)
  Problem: Consults Goal: RH GENERAL PATIENT EDUCATION Description: See Patient Education module for education specifics. Outcome: Progressing Goal: Skin Care Protocol Initiated - if Braden Score 18 or less Description: If consults are not indicated, leave blank or document N/A Outcome: Progressing Goal: Diabetes Guidelines if Diabetic/Glucose > 140 Description: If diabetic or lab glucose is > 140 mg/dl - Initiate Diabetes/Hyperglycemia Guidelines & Document Interventions  Outcome: Progressing   Problem: RH SKIN INTEGRITY Goal: RH STG MAINTAIN SKIN INTEGRITY WITH ASSISTANCE Description: STG Maintain Skin Integrity With Assistance. Outcome: Progressing   Problem: RH SAFETY Goal: RH STG ADHERE TO SAFETY PRECAUTIONS W/ASSISTANCE/DEVICE Description: STG Adhere to Safety Precautions With Assistance/Device. Outcome: Progressing Goal: RH STG DECREASED RISK OF FALL WITH ASSISTANCE Description: STG Decreased Risk of Fall With Assistance. Outcome: Progressing

## 2020-03-06 NOTE — Progress Notes (Signed)
Inpatient Rehabilitation  Patient information reviewed and entered into eRehab system by Agam Tuohy M. Susumu Hackler, M.A., CCC/SLP, PPS Coordinator.  Information including medical coding, functional ability and quality indicators will be reviewed and updated through discharge.    

## 2020-03-06 NOTE — Progress Notes (Signed)
Orthopedic Tech Progress Note Patient Details:  Monica [name] November 16, 1950 [id number] Called in order to [employer] for a pair of PRAFO BOOTS Patient ID: Monica [name], female   DOB: [birth year], 69 y.o.   MRN: [id number]   Monica [name] 03/06/2020, 4:10 PM

## 2020-03-06 NOTE — Progress Notes (Signed)
PHYSICAL MEDICINE & REHABILITATION PROGRESS NOTE  Subjective/Complaints:   No issues overnite, still with bilateral foot drop, thumb numbness.    ROS: Denies CP, shortness of breath, nausea, vomiting, diarrhea.  Objective: Vital Signs: Blood pressure 115/68, pulse 65, temperature 97.6 F (36.4 C), resp. rate 16, height 5\' 4"  (1.626 m), weight 57.8 kg, SpO2 99 %. No results found. Recent Labs    03/04/20 0622 03/06/20 0741  WBC 6.4 5.5  HGB 10.1* 10.2*  HCT 32.3* 32.2*  PLT 146* 172   Recent Labs    03/04/20 0622 03/06/20 0741  NA 132* 132*  K 3.6 3.5  CL 99 97*  CO2 24 26  GLUCOSE 268* 319*  BUN 19 17  CREATININE 0.96 0.95  CALCIUM 9.2 9.4    Physical Exam: BP 115/68 (BP Location: Left Arm)   Pulse 65   Temp 97.6 F (36.4 C)   Resp 16   Ht 5\' 4"  (1.626 m)   Wt 57.8 kg   SpO2 99%   BMI 21.87 kg/m   Constitutional: No distress . Vital signs reviewed. HENT: Normocephalic.  Atraumatic. Eyes: EOMI. No discharge. Cardiovascular: No JVD. Respiratory: Normal effort.  No stridor. GI: Non-distended. Skin: Right lateral thigh healing Psych: Normal mood.  Normal behavior. Musc: No edema in extremities.  No tenderness in extremities. Neuro: Alert Motor: Right lower extremity: Hip flexion, knee extension 0/5, ankle dorsiflexion 2-/5 Left lower extremity: Hip flexion, knee extension 4+/5, 0/5 ankle DF, 2- PF  Assessment/Plan: 1. 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  Physiatrist is providing close team supervision and 24 hour management of active medical problems listed below.  Physiatrist and rehab team continue to assess barriers to discharge/monitor patient progress toward functional and medical goals  Care Tool:  Bathing    Body parts bathed by patient: Right arm, Left arm, Chest, Abdomen, Face, Right upper leg, Left  upper leg   Body parts bathed by helper: Right lower leg, Left lower leg, Front perineal area, Buttocks     Bathing assist Assist Level: Moderate Assistance - Patient 50 - 74%     Upper Body Dressing/Undressing Upper body dressing   What is the patient wearing?: Hospital gown only    Upper body assist Assist Level: Contact Guard/Touching assist    Lower Body Dressing/Undressing Lower body dressing      What is the patient wearing?: Hospital gown only     Lower body assist Assist for lower body dressing: Total Assistance - Patient < 25%     Toileting Toileting    Toileting assist Assist for toileting: 2 Helpers     Transfers Chair/bed transfer  Transfers assist  Chair/bed transfer activity did not occur: Refused (due to 10/10 pain and fatigue)  Chair/bed transfer assist level: 2 Helpers     Locomotion Ambulation   Ambulation assist   Ambulation activity did not occur: Refused (due to 10/10 pain and fatigue)          Walk 10 feet activity   Assist  Walk 10 feet activity did not occur: Refused (due to 10/10 pain and fatigue)        Walk 50 feet activity   Assist Walk 50 feet with 2 turns activity did not occur: Refused (due to 10/10 pain and fatigue)         Walk 150 feet activity   Assist Walk 150 feet activity did not occur:  Refused (due to 10/10 pain and fatigue)         Walk 10 feet on uneven surface  activity   Assist Walk 10 feet on uneven surfaces activity did not occur: Refused (due to 10/10 pain and fatigue)         Wheelchair     Assist Will patient use wheelchair at discharge?: Yes Type of Wheelchair: Manual Wheelchair activity did not occur: Refused (due to 10/10 pain and fatigue)         Wheelchair 50 feet with 2 turns activity    Assist    Wheelchair 50 feet with 2 turns activity did not occur: Refused (due to 10/10 pain and fatigue)       Wheelchair 150 feet activity     Assist Wheelchair  150 feet activity did not occur: Refused (due to 10/10 pain and fatigue)         Medical Problem List and Plan: 1. 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  Continue CIR PT, OT, SLP  2.  Antithrombotics: -DVT/anticoagulation:  Pharmaceutical: Lovenox             -antiplatelet therapy: N/A 3. Chronic neck/back/Pain Management: Continue Methadone 20 mg tid, Pamelor 25 mg/hs and Nuycnta prn. Tizanidine tid prn for spasms.    Relatively controlled with meds on 7/26  Monitor with increased exertion 4.  Mood: LCSW to follow for evaluation ad support.              -antipsychotic agents: N/A 5. Neuropsych: This patient is capable of making decisions on her own behalf. 6. Skin/Wound Care: Routine pressure relief measures.  7. Fluids/Electrolytes/Nutrition: Monitor I/O.   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  9.T2DM with hyperglycemia-poorly controlled: A1c- 8.6.   Levemir 5 units BID, increased to 8 BID on 7/25  Novolog 5 units tic ac             CBG (last 3)  Recent Labs    03/05/20 1625 03/05/20 2019 03/06/20 0616  GLUCAP 326* 276* 230*   Increase levimir to 10U BID 10. Normocytic anemia: Stable overall. Monitor for signs of infection.              Hemoglobin 10.1 on 7/24, 10.2 7/26 stable  Continue to monitor 11. Low calorie malnutrition: Continue supplement 12. Hyponatremia/ chronic Hypomagnesemia:   Sodium 132 on 7/24, 7/26  Mag normal 1.8 on 7/26 13. Abnormal LFTs: Resolved 14.  Drug-induced constipation: Dulcolax prn effective.   Improving  Increased bowel meds as necessary 15. Persistent daily HA: On topamax 100 mg/day.   Monitor with increased activity 16.  Thrombocytopenia  Platelets 146 on 7/24, labs ordered for tomorrow   LOS: 3 days A FACE TO FACE EVALUATION WAS PERFORMED  Erick Colace 03/06/2020, 11:44 AM

## 2020-03-06 NOTE — IPOC Note (Signed)
Overall Plan of Care [facility]) Patient Details Name: Monica [name] MRN: [id number] DOB: Nov 03, 1950  Admitting Diagnosis: Polyradiculoneuropathy [place])  Hospital Problems: Principal Problem:   Polyradiculoneuropathy [facility]) Active Problems:   Thrombocytopenia (HCC)   Persistent headaches   Drug induced constipation   Uncontrolled type 2 diabetes mellitus with hyperglycemia (HCC)     Functional Problem List: Nursing Bladder, Motor, Pain, Skin Integrity  PT Balance, Endurance, Motor, Pain, Skin Integrity  OT Balance, Safety, Sensory, Behavior, Endurance, Motor, Pain, Perception  SLP    TR         Basic ADL's: OT Grooming, Bathing, Dressing, Toileting     Advanced  ADL's: OT       Transfers: PT Bed Mobility, Bed to Chair, Car, Occupational psychologist, Research scientist (life sciences): PT Ambulation, Psychologist, prison and probation services, Stairs     Additional Impairments: OT    SLP        TR      Anticipated Outcomes Item Anticipated Outcome  Self Feeding n/a  Swallowing      Basic self-care  min A overall  Toileting  supervision   Bathroom Transfers supervision  Bowel/Bladder  patient will manage bowel and bladder with mod 1 assist  Transfers  supervision with LRAD  Locomotion  CGA with LRAD  Communication     Cognition     Pain  pain will manage pain <or +3  Safety/Judgment  patient will remain free of injury   Therapy Plan: PT Intensity: Minimum of 1-2 x/day ,45 to 90 minutes PT Frequency: 5 out of 7 days PT Duration Estimated Length of Stay: 14-18 days OT Intensity: Minimum of 1-2 x/day, 45 to 90 minutes OT Frequency: 5 out of 7 days OT Duration/Estimated Length of Stay: 12-14 days     Due to the current state of emergency, patients may not be receiving their 3-hours of Medicare-mandated therapy.   Team Interventions: Nursing Interventions Patient/Family Education, Bladder Management, Bowel Management, Pain Management, Medication Management, Skin Care/Wound  Management, Discharge Planning  PT interventions Ambulation/gait training, Discharge planning, Functional mobility training, Psychosocial support, Therapeutic Activities, Balance/vestibular training, Disease management/prevention, Neuromuscular re-education, Skin care/wound management, Therapeutic Exercise, Wheelchair propulsion/positioning, DME/adaptive equipment instruction, Pain management, Splinting/orthotics, UE/LE Strength taining/ROM, Community reintegration, Development worker, international aid stimulation, Patient/family education, Museum/gallery curator, UE/LE Coordination activities  OT Interventions Warden/ranger, Neuromuscular re-education, Disease mangement/prevention, Self Care/advanced ADL retraining, Therapeutic Exercise, Wheelchair propulsion/positioning, Pain management, UE/LE Strength taining/ROM, DME/adaptive equipment instruction, Community reintegration, Development worker, international aid stimulation, Patient/family education, Splinting/orthotics, UE/LE Coordination activities, Discharge planning, Functional mobility training, Psychosocial support, Visual/perceptual remediation/compensation, Therapeutic Activities  SLP Interventions    TR Interventions    SW/CM Interventions Psychosocial Support, Patient/Family Education, Discharge Planning   Barriers to Discharge MD  Medical stability  Nursing Medication compliance    PT Inaccessible home environment, Home environment access/layout, Wound Care, Other (comments) high pain levels, 4-5 STE with 1 rail, LE weakness  OT      SLP      SW       Team Discharge Planning: Destination: PT-Home ,OT- Home , SLP-  Projected Follow-up: PT-Home health PT, OT-  Home health OT, SLP-  Projected Equipment Needs: PT-To be determined, OT- To be determined, SLP-  Equipment Details: PT-has RW, WC, and SPC, OT-  Patient/family involved in discharge planning: PT- Patient,  OT-Patient, SLP-   MD ELOS: 10-14d Medical Rehab Prognosis:  Good Assessment:  69 y.o  female with history of uncontrolled T2D with polyneuropathy, cervicalgia, chronic headaches, chronic pain--on methadone/Nucynta  per Dr. Hermelinda Medicus, constipation, gait disorder with multiple falls and has had progressive weakness with inability to walk in the last 4 weeks.  History taken from chart review and patient.  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  She was started on steroids while awaiting approval for IVIG, however due to 2 issues with progressive weakness as well as severe pain and neuropathy RLE greater than LLE as well as AKI she was admitted for management.  She was started on IV fluids and empirically treated with IVIG with with improvement in weakness and sensation.  Neurology following for work-up and recommended muscle biopsy.  She underwent right sural nerve and superficial quadriceps muscle biopsy by Dr. Yetta Barre on 07/19--pathology still pending.  Dysesthesias RLE had greatly improved however she continues to be limited by weakness with impairments in mobility and ADLs.  She is showing improvement in activity tolerance and CIR was recommended for follow-up therapy.  Please see preadmission assessment from earlier today as well.   Now requiring 24/7 Rehab RN,MD, as well as CIR level PT, OT and SLP.  Treatment team will focus on ADLs and mobility with goals set at [facility] See Team Conference Notes for weekly updates to the plan of care

## 2020-03-06 NOTE — Progress Notes (Signed)
Physical Therapy Session Note  Patient Details  Name: Monica [name] MRN: [id number] Date of Birth: 19-Jun-1951  Today's Date: 03/06/2020 PT Individual Time: 0865-7846 and 9629-5284 PT Individual Time Calculation (min): 57 min and 46 min  Short Term Goals: Week 1:  PT Short Term Goal 1 (Week 1): pt will perform bed mobility with min A PT Short Term Goal 2 (Week 1): Pt will transfer sit<>stand with LRAD mod A PT Short Term Goal 3 (Week 1): Pt will transfer bed<>WC with LRAD mod A  Skilled Therapeutic Interventions/Progress Updates:  Treatment 1: Pt received in bed initially making minimal to no eye contact with PT but after conversation re: PLOF & building rapport with pt, pt becomes more engaged.  Pt reports she's fallen 13 times in the past 2 months (broken ribs from 1 fall). Pt dons L sock with PT assisting with R sock & B shoes. Pt transfer supine>sitting EOB with hospital bed features & using UE to assist RLE to EOB but supervision overall. Assessed muscle strength in BLE (L hip flexion in sitting = 2/5, L knee extension = 2+/5, L ankle dorsiflexion = 1/5, R hip flexion in sitting = 1/5, R knee extension = 2-/5, R ankle dorsiflexion = 1/5). PT positioned stedy for practice in preparation for toilet transfers with nursing & pt requires extended time to scoot to EOB so knees touch stedy plate. Pt transfers sit>stand in stedy from significantly elevated EOB with min assist with extra time and pt not shifting pelvis anteriorly. Once in stedy, pt positioned in front of mirror & completed 1 sit<>stand with improved glute/hip extensor activation with cuing for more upright posture. Pt then reports significant fatigue & appears to become anxious about last transfer. Pt transfers sit>stand with min assist & is assisted back to bed with mod assist for BLE for sit>supine. Pt left in bed with alarm set & all needs in reach, NT in room.  Pain: "close to a 10" across lower abdomen & RLE but pt states she's  premedicated   Treatment 2: Pt received in bed asleep with husband present in room. Pt's curling iron on (husband reports he turned it on 10 min ago) & pt awakened & requested to fix hair. While pt performed grooming PT educated pt's husband [name]) on pt's CLOF in earlier therapy session & established therapy goals. [name] asked several questions re: recovery & medical diagnosis with PT deferring to medical team. Also presented idea of ramp installation at their home for potential future use. [name] reports they are building a house in [place] with plans to move in ~4 months. Pt transfers supine>sit with hospital bed features, extra time & encouragement with use of BUE to move RLE to EOB. PT assists with donning B shoes. Pt transfers sit<>stand from elevated EOB with min/mod assist with max cuing for safe hand placement with pt performing better when pushing to stand with LUE. Pt with ability to activate glutes & hip extensors to come to full upright standing with multimodal cuing. Pt engaged in marching in place but decreased ability to clear floor with BLE (foot drop noted in BLE). Educated them on request for [facility] boots to be used at night to maintain current ankle ROM. Pt transfers sit>stand and stand pivot bed>w/c with RW & min assist with difficulty advancing feet 2/2 weakness & impaired neuromuscular control, & maneuvering RW in small spaces. Educated pt & husband on interdisciplinary team meeting tomorrow & ELOS. Pt left in w/c with chair alarm  donned & call bell in reach, husband present to supervise. Encouraged pt to sit up for as long as possible. [name] does report concern re: pt's fluctuating cognition & fatigue which PT has observed pt to be fatigued & with impaired memory so far.  Pain: "close to a 9" in RLE with rest breaks provided & pt declining asking for pain meds  Therapy Documentation Precautions:  Precautions Precautions: Fall Precaution Comments: weak and unsteady  RLE Restrictions Weight Bearing Restrictions: Yes   Therapy/Group: Individual Therapy  Sandi Mariscal 03/06/2020, 4:04 PM

## 2020-03-06 NOTE — Progress Notes (Signed)
Occupational Therapy Session Note  Patient Details  Name: Monica [name] MRN: [id number] Date of Birth: 01-Jun-1951  Today's Date: 03/06/2020 OT Individual Time: 7322-0254 OT Individual Time Calculation (min): 73 min    Short Term Goals: Week 1:  OT Short Term Goal 1 (Week 1): Pt will complete toilet transfer with min assist. OT Short Term Goal 2 (Week 1): Pt will utilize AE to complete LB dressing with mod A OT Short Term Goal 3 (Week 1): Pt will engage in functional task in standing for 2 minutes to incresae endurance for ADLs.  Skilled Therapeutic Interventions/Progress Updates:    Pt greeted semi-reclined in bed and agreeable to OT treatment sessio. Pt wanted to shower this morning. Pt completed bed mobility with HOB elevated and min A. Pt reports increased pain in R LE and banding around stomach with movement. Attempted sit<>stand from regular Stedy but pt unable to achieve stand. Pt wanted to try to stand with RW. Pt unable to power up with RW, but  Then stated she needed her shoes on and she could stand. Total A to do shoes at EOB. After 3 more attempts to stand and max A, pt able to power up by pulling on RW. Pt then was able to take small pivoting steps to wc with modA . OT exchanged regular Stedy for bariatric Stedy for shower transfer. Wanted pt to try Stedy again to get more comfortable with with nursing. Sit<>stand in [place] with max A to transfer onto tub bench. Max A for LB bathing and used leaning method to wash buttocks. Max A to stand from shower bench back onto stedy with OT pulling from towel wrapped behind waist. Total A for LB dressing at EOB and min A UB dressing. Pt left semi-reclined I bed with bed alarm on, call bell in reach, and hair dryer/brush to finish grooming tasks.   Therapy Documentation Precautions:  Precautions Precautions: Fall Precaution Comments: weak and unsteady RLE Restrictions Weight Bearing Restrictions: Yes Pain: Pain Assessment Pain Scale:  0-10 Pain Score: 9  Pain Location: Hip Pain Orientation: Right Pain Descriptors / Indicators: Aching;Throbbing Pain Onset: On-going Patients Stated Pain Goal: 2 Pain Intervention(s): ;Repositioned   Therapy/Group: Individual Therapy  Mal Amabile 03/06/2020, 9:47 AM

## 2020-03-07 ENCOUNTER — Inpatient Hospital Stay (HOSPITAL_COMMUNITY): Payer: Medicare Other | Admitting: Physical Therapy

## 2020-03-07 ENCOUNTER — Encounter (HOSPITAL_COMMUNITY): Payer: Self-pay

## 2020-03-07 ENCOUNTER — Inpatient Hospital Stay (HOSPITAL_COMMUNITY): Payer: Medicare Other | Admitting: Occupational Therapy

## 2020-03-07 LAB — GLUCOSE, CAPILLARY
Glucose-Capillary: 181 mg/dL — ABNORMAL HIGH (ref 70–99)
Glucose-Capillary: 251 mg/dL — ABNORMAL HIGH (ref 70–99)
Glucose-Capillary: 264 mg/dL — ABNORMAL HIGH (ref 70–99)
Glucose-Capillary: 214 mg/dL — ABNORMAL HIGH (ref 70–99)

## 2020-03-07 LAB — SURGICAL PATHOLOGY

## 2020-03-07 MED ORDER — INSULIN DETEMIR 100 UNIT/ML ~~LOC~~ SOLN
11.0000 [IU] | Freq: Two times a day (BID) | SUBCUTANEOUS | Status: DC
  Administered 2020-03-07 – 2020-03-08 (×3): 11 [IU] via SUBCUTANEOUS
  Filled 2020-03-07 (×5): qty 0.11

## 2020-03-07 NOTE — Progress Notes (Signed)
Physical Therapy Session Note  Patient Details  Name: Monica [name] MRN: [id number] Date of Birth: 11/18/1950  Today's Date: 03/07/2020 PT Individual Time: 1355-1451 PT Individual Time Calculation (min): 56 min   Short Term Goals: Week 1:  PT Short Term Goal 1 (Week 1): pt will perform bed mobility with min A PT Short Term Goal 2 (Week 1): Pt will transfer sit<>stand with LRAD mod A PT Short Term Goal 3 (Week 1): Pt will transfer bed<>WC with LRAD mod A  Skilled Therapeutic Interventions/Progress Updates:  Pt received in bed with husband [name]) in room. Pt not appearing in distress but reporting 10/10 pain across lower abdomen, RLE & L foot - pain meds requested & administered at beginning of session. Pt transfers supine>sitting EOB with HOB elevated, bed rails, UE to assist RLE to EOB & supervision overall. Pt sits EOB with supervision & PT assists with donning shoes for time management. Pt transfers sit<>stand with min assist & cuing for hand placement (RUE on RW, push up with LUE) and cuing to come to full upright standing. Pt completes stand pivot with min assist with extra cuing & assist to manage RW. Pt reports need to use restroom so transported into bathroom. Pt completes stand pivot w/c<>toilet with grab bar & max assist with cuing to shift pelvis anteriorly during transfer. Pt with continent void on toilet & performs peri hygiene without assistance, PT assists with clothing management. PT observed abrasion on pt's bottom & nurse assessed & placed foam dressing. Pt performs hand hygiene at sink from w/c level with supervision. In dayroom, pt transfers sit<>stand with min/mod assist with ongoing education re: hand placement during transfers. Pt ambulates 10 ft + 15 ft with RW & min assist + w/c follow for safety. Pt demonstrates foot drop BLE (R>L) with absent heel strike and greater difficulty to compensate to clear foot on floor with RLE compared to LLE. Pt also demonstrates narrow step  width with cuing for increased BOS. Pt propels w/c back to room with BUE & supervision with extra time & cuing for steering. Pt left in w/c with chair alarm donned, tending to flowers in vase with [name] present to supervise.  During sit<>stand transfers from w/c PT provides support to maintain neutral R knee alignment, otherwise R knee abducts & pt with LOB.   Therapy Documentation Precautions:  Precautions Precautions: Fall Precaution Comments: weak and unsteady RLE Restrictions Weight Bearing Restrictions: Yes      Therapy/Group: Individual Therapy  Sandi Mariscal 03/07/2020, 2:54 PM

## 2020-03-07 NOTE — Progress Notes (Addendum)
Physical Therapy Session Note  Patient Details  Name: Monica [name] MRN: [id number] Date of Birth: September 24, 1950  Today's Date: 03/07/2020 PT Individual Time: 5573-2202 PT Individual Time Calculation (min): 40 min   Short Term Goals: Week 1:  PT Short Term Goal 1 (Week 1): pt will perform bed mobility with min A PT Short Term Goal 2 (Week 1): Pt will transfer sit<>stand with LRAD mod A PT Short Term Goal 3 (Week 1): Pt will transfer bed<>WC with LRAD mod A  Skilled Therapeutic Interventions/Progress Updates: Pt presents supine in bed and c/o pain to BLEs, R > L.  Encouraged to participate in therapy, but states " I can't do it right now".  PT states will return in 42' for pain meds to work.  When returned, pt agreeable to participate.  Pt required mod A to transfer sup to sit on EOB with verbal cues and increased time.  Pt able to scoot to EOB, assisted w/ donning shoes.  Pt required mod A for sit to stand transfer and then min A for step-pivot bed > w/c w/ RW.  Pt negotiated w/c in hallway w/ CGA and verbal cues up to 30' before fatigues.  Pt performed UBE at Level 1 for 5' x 2 w/o trunk support.  Pt returned to room and performed sit to stand transfer w/ mod A and then step-pivot w/c> bed w/ min A and RW.  Pt states pain is > 10/10.  Pt required CGA for RLE to perform sit to supine transfer and bridging to scoot to middle of bed.  Pt positions bed to comfort, andbed alarm on and all needs in reach.     Therapy Documentation Precautions:  Precautions Precautions: Fall Precaution Comments: weak and unsteady RLE Restrictions Weight Bearing Restrictions: Yes General: PT Amount of Missed Time (min): 35 Minutes PT Missed Treatment Reason: Pain Vital Signs:  Pain: 10/10 BLEs, R > L.     Therapy/Group: Individual Therapy  [name] 03/07/2020, 11:19 AM

## 2020-03-07 NOTE — Patient Care Conference (Signed)
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 Sula Soda Care Coodinator Present: Cecile Sheerer, LCSWA;Miyoko Hashimi Marlyne Beards, RN, BSN, CRRN Nurse Present: Jesusita Oka, LPN PT Present: Aleda Grana, PT OT Present: Kearney Hard, OT SLP Present: Colin Benton, SLP PPS Coordinator present : Edson Snowball, Park Breed, SLP     Current Status/Progress Goal Weekly Team Focus  Bowel/Bladder   Continent of B/B  Remain continent  Up to bathroom with assist   Swallow/Nutrition/ Hydration             ADL's   mod/max A overall  Supervision/Min A  activity tolerance, sit<>stand, transfers, self-care retraining, general strengthening   Mobility   mod assist bed mobility, sit<>stand in stedy with min assist  supervision w/c mobility & transfers, CGA gait, min assist car & stairs  activity tolerance, pt education, transfers, gait as able, w/c mobility, strengthening   Communication             Safety/Cognition/ Behavioral Observations            Pain   10/10 pain  <5 on a 0-10 pain scale  Manage pain and anxiety levels with medication and relaxation techniques to aide in minimizing pain and discomfort.   Skin   incisions x2  No skin breakdown   assess skin q shift and prn     Team Discussion:  Discharge Planning/Teaching Needs:  Continue educating patient and family on B/B, pain, anxiety, and relaxation techniques.  Relaxation and breathing techniques to minimize pain and discomfort.  D/C to home with her husband who will provide 24/7 care. Family education as recommended by therapy. Current Update: Pain and anxiety, refusing therapy  Current Barriers to Discharge:  Pain and anxiety  Possible Resolutions to Barriers: Continue pain medication regimen, manage anxiety.  Patient on target to meet rehab goals: no, patient refusing therapy r/t intense pain. 10/10 pain. Bilateral foot drop, may need bilateral AFO's.  *See Care Plan and progress notes for long and short-term goals.   Revisions to Treatment Plan:  MD putting in order for SLP evaluation.    Medical Summary Current Status: Bowels moving regularly, vitals stable, refused therapy this morning due to pain though denied pain to me this morning, bilateral foot drop, impaired cognition Weekly Focus/Goal: Monitor pain regularly, monitor vitals TID, will need bilateral AFOs, monitor cogntition, anxiety  Barriers to Discharge: Medical stability;Behavior  Barriers to Discharge Comments: Fixated on pain, family support Possible Resolutions to Barriers: Behavioral counseling, bilateral AFOs   Continued Need for Acute Rehabilitation Level of Care: The patient requires daily medical management by a physician with specialized training in physical medicine and rehabilitation for the following reasons: Direction of a multidisciplinary physical rehabilitation program to maximize functional independence : Yes Medical management  of patient stability for increased activity during participation in an intensive rehabilitation regime.: Yes Analysis of laboratory values and/or radiology reports with any subsequent need for medication adjustment and/or medical intervention. : Yes   I  attest that I was present, lead the team conference, and concur with the assessment and plan of the team.   Tennis Must 03/07/2020, 4:33 PM

## 2020-03-07 NOTE — Progress Notes (Signed)
Patient ID: Monica [name], female   DOB: 02/26/1951, 69 y.o.   MRN: [id number]  SW met with pt and pt husband in room to provide updates from team conference, and informed no d/c date set at this time. SW informed there will be updates after team conference on Tuesday.   Monica [name], MSW, LCSWA Office: [phone] Cell: [phone] Fax: [phone] 

## 2020-03-07 NOTE — Progress Notes (Signed)
Occupational Therapy Note  Patient Details  Name: KANYON BUNN MRN: [id number] Date of Birth: 1951/02/28  Today's Date: 03/07/2020 OT Missed Time: 45 Minutes Missed Time Reason: Pain  Attempted to see patient for scheduled OT session. Pt in tears upon OT arrival reporting 10/10 pain in B LEs and headache. Pt donned sunglasses and stated she could not do anything with therapy right now. OT to follow up per plan of care.   Merlene Laughter Ladana Chavero 03/07/2020, 10:34 AM

## 2020-03-07 NOTE — Progress Notes (Signed)
[place] PHYSICAL MEDICINE & REHABILITATION PROGRESS NOTE  Subjective/Complaints:  No issues overnight  ROS: Denies CP, shortness of breath, nausea, vomiting, diarrhea.  Objective: Vital Signs: Blood pressure (!) 133/69, pulse 96, temperature 98.5 F (36.9 C), temperature source Oral, resp. rate 16, height 5\' 4"  (1.626 m), weight 57.8 kg, SpO2 93 %. No results found. Recent Labs    03/06/20 0741  WBC 5.5  HGB 10.2*  HCT 32.2*  PLT 172   Recent Labs    03/06/20 0741  NA 132*  K 3.5  CL 97*  CO2 26  GLUCOSE 319*  BUN 17  CREATININE 0.95  CALCIUM 9.4    Physical Exam: BP (!) 133/69 (BP Location: Left Arm)   Pulse 96   Temp 98.5 F (36.9 C) (Oral)   Resp 16   Ht 5\' 4"  (1.626 m)   Wt 57.8 kg   SpO2 93%   BMI 21.87 kg/m   General: Alert and oriented x 3, No apparent distress HEENT: Head is normocephalic, atraumatic, PERRLA, EOMI, sclera anicteric, oral mucosa pink and moist, dentition intact, ext ear canals clear,  Neck: Supple without JVD or lymphadenopathy Heart: Reg rate and rhythm. No murmurs rubs or gallops Chest: CTA bilaterally without wheezes, rales, or rhonchi; no distress Abdomen: Soft, non-tender, non-distended, bowel sounds positive. Extremities: No clubbing, cyanosis, or edema. Pulses are 2+ Skin: Right lateral thigh healing Psych: Normal mood.  Normal behavior. Musc: No edema in extremities.  No tenderness in extremities. Neuro: Alert Motor: Right lower extremity: Hip flexion, knee extension 0/5, ankle dorsiflexion 2-/5 Left lower extremity: Hip flexion, knee extension 4+/5, 0/5 ankle DF, 2- PF    Assessment/Plan: 1. 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  Physiatrist is providing close team supervision and 24 hour management of active medical problems listed below.  Physiatrist and rehab team continue to assess  barriers to discharge/monitor patient progress toward functional and medical goals  Care Tool:  Bathing    Body parts bathed by patient: Right arm, Left arm, Chest, Abdomen, Face, Right upper leg, Left upper leg   Body parts bathed by helper: Right lower leg, Left lower leg, Front perineal area, Buttocks     Bathing assist Assist Level: Moderate Assistance - Patient 50 - 74%     Upper Body Dressing/Undressing Upper body dressing   What is the patient wearing?: Hospital gown only    Upper body assist Assist Level: Contact Guard/Touching assist    Lower Body Dressing/Undressing Lower body dressing      What is the patient wearing?: Hospital gown only     Lower body assist Assist for lower body dressing: Total Assistance - Patient < 25%     Toileting Toileting    Toileting assist Assist for toileting: 2 Helpers     Transfers Chair/bed transfer  Transfers assist  Chair/bed transfer activity did not occur: Refused (due to 10/10 pain and fatigue)  Chair/bed transfer assist level: Minimal Assistance - Patient > 75%     Locomotion Ambulation   Ambulation assist   Ambulation activity did not occur: Refused (due to 10/10 pain and fatigue)          Walk 10 feet activity   Assist  Walk 10 feet activity did not occur: Refused (due to 10/10 pain and fatigue)        Walk 50 feet activity   Assist Walk 50 feet with 2 turns activity  did not occur: Refused (due to 10/10 pain and fatigue)         Walk 150 feet activity   Assist Walk 150 feet activity did not occur: Refused (due to 10/10 pain and fatigue)         Walk 10 feet on uneven surface  activity   Assist Walk 10 feet on uneven surfaces activity did not occur: Refused (due to 10/10 pain and fatigue)         Wheelchair     Assist Will patient use wheelchair at discharge?: Yes Type of Wheelchair: Manual Wheelchair activity did not occur: Refused (due to 10/10 pain and fatigue)          Wheelchair 50 feet with 2 turns activity    Assist    Wheelchair 50 feet with 2 turns activity did not occur: Refused (due to 10/10 pain and fatigue)       Wheelchair 150 feet activity     Assist Wheelchair 150 feet activity did not occur: Refused (due to 10/10 pain and fatigue)         Medical Problem List and Plan: 1. 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  Continue CIR PT, OT, SLP   Team conference 7/27 2.  Antithrombotics: -DVT/anticoagulation:  Pharmaceutical: Continue Lovenox             -antiplatelet therapy: N/A 3. Chronic neck/back/Pain Management: Continue Methadone 20 mg tid, Pamelor 25 mg/hs and Nuycnta prn. Tizanidine tid prn for spasms.    Controlled 7/27  Monitor with increased exertion 4.  Mood: LCSW to follow for evaluation ad support.              -antipsychotic agents: N/A 5. Neuropsych: This patient is capable of making decisions on her own behalf. 6. Skin/Wound Care: Routine pressure relief measures.  7. Fluids/Electrolytes/Nutrition: Monitor I/O.   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  9.T2DM with hyperglycemia-poorly controlled: A1c- 8.6.   Levemir 5 units BID, increased to 8 BID on 7/25  Novolog 5 units tic ac             CBG (last 3)  Recent Labs    03/06/20 1638 03/06/20 2109 03/07/20 0609  GLUCAP 275* 242* 181*   Increase levimir to 11 U BID 10. Normocytic anemia: Stable overall. Monitor for signs of infection.              Hemoglobin 10.1 on 7/24, 10.2 7/26 stable  Continue to monitor 11. Low calorie malnutrition: Continue supplement 12. Hyponatremia/ chronic Hypomagnesemia:   Sodium 132 on 7/24, 7/26  Mag normal 1.8 on 7/26 13. Abnormal LFTs: Resolved 14.  Drug-induced constipation: Dulcolax prn effective.   Improving  Increased bowel meds as necessary 15. Persistent daily HA: On topamax 100 mg/day.    Monitor with increased activity 16.  Thrombocytopenia  Platelets 146 on 7/24, 172 on 7/26   LOS: 4 days A FACE TO FACE EVALUATION WAS PERFORMED  Monica [name] 03/07/2020, 9:19 AM

## 2020-03-08 ENCOUNTER — Inpatient Hospital Stay (HOSPITAL_COMMUNITY): Payer: Medicare Other | Admitting: Physical Therapy

## 2020-03-08 ENCOUNTER — Inpatient Hospital Stay (HOSPITAL_COMMUNITY): Payer: Medicare Other

## 2020-03-08 LAB — GLUCOSE, CAPILLARY
Glucose-Capillary: 117 mg/dL — ABNORMAL HIGH (ref 70–99)
Glucose-Capillary: 210 mg/dL — ABNORMAL HIGH (ref 70–99)
Glucose-Capillary: 251 mg/dL — ABNORMAL HIGH (ref 70–99)
Glucose-Capillary: 144 mg/dL — ABNORMAL HIGH (ref 70–99)

## 2020-03-08 MED ORDER — INSULIN DETEMIR 100 UNIT/ML ~~LOC~~ SOLN
12.0000 [IU] | Freq: Two times a day (BID) | SUBCUTANEOUS | Status: DC
  Administered 2020-03-09 – 2020-03-10 (×3): 12 [IU] via SUBCUTANEOUS
  Filled 2020-03-08 (×5): qty 0.12

## 2020-03-08 MED ORDER — GABAPENTIN 600 MG PO TABS
300.0000 mg | ORAL_TABLET | Freq: Three times a day (TID) | ORAL | Status: DC
  Administered 2020-03-08 – 2020-03-14 (×19): 300 mg via ORAL
  Filled 2020-03-08 (×20): qty 1

## 2020-03-08 NOTE — Progress Notes (Signed)
Pt informed this nurse that she has no had a BM in 5 days, which she reported was her "norm". This nurse administered po med to induce BM per he patient's request at he end of the shift

## 2020-03-08 NOTE — Progress Notes (Signed)
Physical Therapy Session Note  Patient Details  Name: Monica [name] MRN: [id number] Date of Birth: [birth year]  Today's Date: 03/08/2020 PT Individual Time: [phone] and 1133-1200 and 1453-1533 PT Individual Time Calculation (min): 41 min and 27 min and 40 min  Short Term Goals: Week 1:  PT Short Term Goal 1 (Week 1): pt will perform bed mobility with min A PT Short Term Goal 2 (Week 1): Pt will transfer sit<>stand with LRAD mod A PT Short Term Goal 3 (Week 1): Pt will transfer bed<>WC with LRAD mod A  Skilled Therapeutic Interventions/Progress Updates:  Treatment 1: Pt received on toilet & agreeable to tx. Pt with continent void, performing peri hygiene without assistance. Stand pivot toilet>w/c with grab bar & min assist with PT pulling pants over hips. Pt performs hand hygiene at sink from w/c level with set up assist. Pt with c/o L foot feeling "not there, dead, there's no feeling there" with pt reporting numbness in L foot has "never felt this bad". Pt propels w/c room>gym with set up & assist & BUE for strengthening. Pt transfer sit>stand at high/low table with mod assist & stands for 6 minutes 15 seconds while engaging in table top game with min assist for balance with task focusing on standing tolerance & BLE strengthening, as well as balance. Pt repositions RLE a few times because "it's tired". At end of session pt left in w/c with chair alarm donned, tending to flowers in vase. Pain: RLE, L foot & ankle, 7/10 with rest breaks provided & nurse administers meds at beginning of session   Treatment 2: Pt received in bed & agreeable to tx. Supine>sit with supervision, bed flat, & extra time. Pt completes sit>stand from elevated EOB & stand pivot with min assist with RW with extra time to advance BLE. Transported pt to/from gym via w/c dependent assist for time management. Pt utilized kinetron from w/c level with increasing resistance & activity focusing on BLE strengthening. Pt then performs  3 repetitions of sit<>stand from w/c with RW & CGA with cuing for increased glute/hip activation for increased anterior pelvic shift to come to full upright standing. At end of session pt left in w/c with chair alarm donned, call bell in reach. Pain: no formal c/o pain, pt c/o L foot still not "waking up"   Treatment 3: Pt received asleep in bed but easily awakened & agreeable to tx. Supine>sit with bed flat, rails, and supervision with extra time & using UE to move RLE to EOB. Sit>stand from elevated EOB with min assist & short distance gait to w/c with min assist with decreased foot clearance BLE 2/2 foot drop & hip flexor weakness (R>L). Pt's husband arrived with tennis shoes & pt transported to/from gym via w/c dependent assist. Provided pt with B PLS AFO's & donned them. Pt transfers sit>stand with mod assist from w/c with cuing for hand placement. Pt reports she feels as though her R foot "is not there" & with decreased ability to advance BLE, very unsafe with taking steps, at one point hanging on RW with BUE when stepping forwards so activity ended. Discussed AFO's with PA Elita Quick) who defers to [name] 2/2 pt's neuropathy. Testing completed with pt demonstrating intact sensation to light touch but impaired proprioception in B feet. Pt returned to room & requested to go to bed 2/2 BLE edema (plans to get B ELR tomorrow to promote out of bed tolerance & only mild edema noted in BLE). Pt completes stand pivot  w/c>bed with RW & min assist and sit>supine with min assist for RLE. Educated pt & husband on need for skin inspections after using AFO's & this PT's plans to discuss AFOs with [name] upon his return next week. At this time pt will require BLE bracing for safe ambulation 2/2 foot drop & significant hip flexor weakness (pt also with R knee collapsing medially with sit>stand transfers), and if unable to use AFO's depending upon MD recommendation pt would instead need to use w/c for increased safety  with mobility. Pt left in bed with alarm set, call bell in reach, & husband present in room, all needs at hand. Pain: 3/10 in arch of L foot - nurse administers med at beginning of session    Therapy Documentation Precautions:  Precautions Precautions: Fall Precaution Comments: weak and unsteady RLE Restrictions Weight Bearing Restrictions: No    Therapy/Group: Individual Therapy  Sandi Mariscal 03/08/2020, 3:43 PM

## 2020-03-08 NOTE — Progress Notes (Signed)
Occupational Therapy Session Note  Patient Details  Name: Monica [name] MRN: [id number] Date of Birth: 1951-06-15  Today's Date: 03/08/2020 OT Individual Time: 1300-1415 OT Individual Time Calculation (min): 75 min    Short Term Goals: Week 1:  OT Short Term Goal 1 (Week 1): Pt will complete toilet transfer with min assist. OT Short Term Goal 2 (Week 1): Pt will utilize AE to complete LB dressing with mod A OT Short Term Goal 3 (Week 1): Pt will engage in functional task in standing for 2 minutes to incresae endurance for ADLs.  Skilled Therapeutic Interventions/Progress Updates:    1:1. Pt received in w/c with no report of pain but states her L foot is "dead" and "numb." Pt able to identfy all light and deep touch on LLE however reports on volar side of foot light touch is painful. Pt agreeable to BADL and completes transfers with MOD lifting A and grab bars to transfer w/c<>BSC/ TTB in shower/EOB. Pt requires increased time to process VC and increased FOF during all standing transitional movement. PT able ot manage pants past hips and doff with MIN A for standing balance using grab bar. tp doffs shirt on toilet but unable ot have BM. Pt bathes seated using lateral leans with A only to wash R foot. Pt dresses with set up for shirt, VC for threading RLE first and MOD A for advanceing pants past hips despite pt attempting to complete herself with R knee block. Pt FOF impacting functional performance of BADLs and pt is self limiting d/t fear not listening to cues/encouragement. Pt requesting to return to bed and socks donned total A and exited sesison wih tpt seated in bed, exit alarm on and call light in reach    Therapy Documentation Precautions:  Precautions Precautions: Fall Precaution Comments: weak and unsteady RLE Restrictions Weight Bearing Restrictions: No General:   Vital Signs:   Pain: Pain Assessment Pain Scale: 0-10 Pain Score: 3  ADL:   Vision   Perception     Praxis   Exercises:   Other Treatments:     Therapy/Group: Individual Therapy  Shon Hale 03/08/2020, 1:25 PM

## 2020-03-08 NOTE — Progress Notes (Signed)
[place] PHYSICAL MEDICINE & REHABILITATION PROGRESS NOTE  Subjective/Complaints: Pain in her legs brings patient to tears. Feels like burning type pain. Worse in right leg and left foot. She is disappointed she has had to miss therapy because of this.  ROS: Denies CP, shortness of breath, nausea, vomiting, diarrhea.  Objective: Vital Signs: Blood pressure 111/67, pulse 73, temperature 98.4 F (36.9 C), resp. rate 16, height 5\' 4"  (1.626 m), weight 57.8 kg, SpO2 98 %. No results found. Recent Labs    03/06/20 0741  WBC 5.5  HGB 10.2*  HCT 32.2*  PLT 172   Recent Labs    03/06/20 0741  NA 132*  K 3.5  CL 97*  CO2 26  GLUCOSE 319*  BUN 17  CREATININE 0.95  CALCIUM 9.4    Physical Exam: BP 111/67 (BP Location: Right Arm)   Pulse 73   Temp 98.4 F (36.9 C)   Resp 16   Ht 5\' 4"  (1.626 m)   Wt 57.8 kg   SpO2 98%   BMI 21.87 kg/m   General: Alert and oriented x 3, No apparent distress HEENT: Head is normocephalic, atraumatic, PERRLA, EOMI, sclera anicteric, oral mucosa pink and moist, dentition intact, ext ear canals clear,  Neck: Supple without JVD or lymphadenopathy Heart: Reg rate and rhythm. No murmurs rubs or gallops Chest: CTA bilaterally without wheezes, rales, or rhonchi; no distress Abdomen: Soft, non-tender, non-distended, bowel sounds positive. Extremities: No clubbing, cyanosis, or edema. Pulses are 2+ Skin: Right lateral thigh healing Psych: Normal mood.  Normal behavior. Musc: No edema in extremities.  No tenderness in extremities. Neuro: Alert Motor: Right lower extremity: Hip flexion, knee extension 0/5, ankle dorsiflexion 2-/5 Left lower extremity: Hip flexion, knee extension 4+/5, 0/5 ankle DF, 2- PF  Assessment/Plan: 1. 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  Physiatrist is providing close team supervision and 24  hour management of active medical problems listed below.  Physiatrist and rehab team continue to assess barriers to discharge/monitor patient progress toward functional and medical goals  Care Tool:  Bathing    Body parts bathed by patient: Right arm, Left arm, Chest, Abdomen, Face, Right upper leg, Left upper leg, Front perineal area, Buttocks, Left lower leg   Body parts bathed by helper: Right lower leg     Bathing assist Assist Level: Minimal Assistance - Patient > 75%     Upper Body Dressing/Undressing Upper body dressing   What is the patient wearing?: Pull over shirt    Upper body assist Assist Level: Supervision/Verbal cueing    Lower Body Dressing/Undressing Lower body dressing      What is the patient wearing?: Pants     Lower body assist Assist for lower body dressing: Maximal Assistance - Patient 25 - 49%     Toileting Toileting    Toileting assist Assist for toileting: 2 Helpers     Transfers Chair/bed transfer  Transfers assist  Chair/bed transfer activity did not occur: Refused (due to 10/10 pain and fatigue)  Chair/bed transfer assist level: Minimal Assistance - Patient > 75%     Locomotion Ambulation   Ambulation assist   Ambulation activity did not occur: Refused (due to 10/10 pain and fatigue)  Assist level: Minimal Assistance - Patient > 75% Assistive device: Walker-rolling Max distance: 20   Walk 10 feet activity   Assist  Walk 10 feet activity did not occur: Refused (due to  10/10 pain and fatigue)  Assist level: Minimal Assistance - Patient > 75% Assistive device: Walker-rolling   Walk 50 feet activity   Assist Walk 50 feet with 2 turns activity did not occur: Refused         Walk 150 feet activity   Assist Walk 150 feet activity did not occur: Refused         Walk 10 feet on uneven surface  activity   Assist Walk 10 feet on uneven surfaces activity did not occur: Refused (due to 10/10 pain and  fatigue)         Wheelchair     Assist Will patient use wheelchair at discharge?: Yes Type of Wheelchair: Manual Wheelchair activity did not occur: Refused (due to 10/10 pain and fatigue)  Wheelchair assist level: Set up assist Max wheelchair distance: 150 ft    Wheelchair 50 feet with 2 turns activity    Assist    Wheelchair 50 feet with 2 turns activity did not occur: Refused (due to 10/10 pain and fatigue)   Assist Level: Set up assist   Wheelchair 150 feet activity     Assist Wheelchair 150 feet activity did not occur: Refused (due to 10/10 pain and fatigue)   Assist Level: Set up assist     Medical Problem List and Plan: 1. 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  Continue CIR PT, OT, SLP   Team conference 7/27  7/28: Participated in therapies much better today. 2.  Antithrombotics: -DVT/anticoagulation:  Pharmaceutical: Continue Lovenox given limited ambulation.             -antiplatelet therapy: N/A 3. Chronic neck/back/Pain Management: Continue Methadone 20 mg tid, Pamelor 25 mg/hs and Nuycnta prn. Tizanidine tid prn for spasms.    7/28: Started Gabapentin TID 300mg . Patient has listed allergy of worsening leg pain but patient does not recall this and would like to try again.  Monitor with increased exertion 4.  Mood: LCSW to follow for evaluation ad support.              -antipsychotic agents: N/A 5. Neuropsych: This patient is capable of making decisions on her own behalf. 6. Skin/Wound Care: Routine pressure relief measures.  7. Fluids/Electrolytes/Nutrition: Monitor I/O.   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  9.T2DM with hyperglycemia-poorly controlled: A1c- 8.6.   Levemir 5 units BID, increased to 8 BID on 7/25  Novolog 5 units tic ac             CBG (last 3)  Recent Labs    03/08/20 0553 03/08/20 1204 03/08/20 1633   GLUCAP 117* 210* 251*   7/28: Increase Levemir to 12U BID.  10. Normocytic anemia: Stable overall. Monitor for signs of infection.              Hemoglobin 10.1 on 7/24, 10.2 7/26 stable  Continue to monitor 11. Low calorie malnutrition: Continue supplement 12. Hyponatremia/ chronic Hypomagnesemia:   Sodium 132 on 7/24, 7/26  Mag normal 1.8 on 7/26 13. Abnormal LFTs: Resolved 14.  Drug-induced constipation: Dulcolax prn effective.   Improving  Increased bowel meds as necessary 15. Persistent daily HA: On topamax 100 mg/day.   Monitor with increased activity 16.  Thrombocytopenia  Platelets 146 on 7/24, 172 on 7/26   LOS: 5 days A FACE TO FACE EVALUATION WAS PERFORMED  8/26 P Bonnell Placzek 03/08/2020, 8:06 PM

## 2020-03-08 NOTE — Progress Notes (Signed)
Physical Therapy Session Note  Patient Details  Name: Monica [name] MRN: [id number] Date of Birth: 02/16/1951  Today's Date: 03/08/2020 PT Individual Time: 0801-0859 PT Individual Time Calculation (min): 58 min   Short Term Goals: Week 1:  PT Short Term Goal 1 (Week 1): pt will perform bed mobility with min A PT Short Term Goal 2 (Week 1): Pt will transfer sit<>stand with LRAD mod A PT Short Term Goal 3 (Week 1): Pt will transfer bed<>WC with LRAD mod A  Skilled Therapeutic Interventions/Progress Updates: Pt presents supine in bed, and agreeable to therapy.  Pt states pain is 8/10 to low back and R LE and received meds prior to entering.  Pt transferred sup to sit w/ supervision and scoots to EOB.  Pt performed multiple sit to stand transfers w/ mod to min A depending on fatigue and height of surface.  Pt required min A from elevated bed, mod A from w/c.  Pt amb multiple trials w/ RW and min A, verbal cues for sequencing and right LE clearance.  Trial'd ace wrap to improve R dorsiflexion and pronation w/ improved gait cycle to step-through.  Pt amb up to 20' per trial but fatigues quickly.  Pt amb over carpeted surfaces as well.  Pt returned to room and remained in w/c w/ chair alarm on and all needs in reach.     Therapy Documentation Precautions:  Precautions Precautions: Fall Precaution Comments: weak and unsteady RLE Restrictions Weight Bearing Restrictions: No General:   Vital Signs:   Pain:8/10 low back, RLE w/ pain meds Pain Assessment Pain Scale: 0-10 Pain Score: 7  Mobility:   Other Treatments:      Therapy/Group: Individual Therapy  [name] 03/08/2020, 9:00 AM

## 2020-03-09 ENCOUNTER — Inpatient Hospital Stay (HOSPITAL_COMMUNITY): Payer: Medicare Other | Admitting: Physical Therapy

## 2020-03-09 ENCOUNTER — Inpatient Hospital Stay (HOSPITAL_COMMUNITY): Payer: Medicare Other | Admitting: Occupational Therapy

## 2020-03-09 LAB — GLUCOSE, CAPILLARY
Glucose-Capillary: 127 mg/dL — ABNORMAL HIGH (ref 70–99)
Glucose-Capillary: 264 mg/dL — ABNORMAL HIGH (ref 70–99)
Glucose-Capillary: 138 mg/dL — ABNORMAL HIGH (ref 70–99)
Glucose-Capillary: 132 mg/dL — ABNORMAL HIGH (ref 70–99)

## 2020-03-09 MED ORDER — INSULIN ASPART 100 UNIT/ML ~~LOC~~ SOLN
0.0000 [IU] | Freq: Three times a day (TID) | SUBCUTANEOUS | Status: DC
  Administered 2020-03-09: 3 [IU] via SUBCUTANEOUS
  Administered 2020-03-10 (×2): 15 [IU] via SUBCUTANEOUS
  Administered 2020-03-10: 11 [IU] via SUBCUTANEOUS
  Administered 2020-03-11: 3 [IU] via SUBCUTANEOUS
  Administered 2020-03-11: 20 [IU] via SUBCUTANEOUS
  Administered 2020-03-11 – 2020-03-12 (×2): 7 [IU] via SUBCUTANEOUS
  Administered 2020-03-12: 11 [IU] via SUBCUTANEOUS
  Administered 2020-03-12: 15 [IU] via SUBCUTANEOUS
  Administered 2020-03-13: 20 [IU] via SUBCUTANEOUS
  Administered 2020-03-13 (×2): 4 [IU] via SUBCUTANEOUS
  Administered 2020-03-14: 3 [IU] via SUBCUTANEOUS
  Administered 2020-03-14 – 2020-03-15 (×2): 4 [IU] via SUBCUTANEOUS
  Administered 2020-03-15: 7 [IU] via SUBCUTANEOUS
  Administered 2020-03-16: 3 [IU] via SUBCUTANEOUS
  Administered 2020-03-17: 7 [IU] via SUBCUTANEOUS
  Administered 2020-03-18: 4 [IU] via SUBCUTANEOUS
  Administered 2020-03-18 – 2020-03-20 (×6): 3 [IU] via SUBCUTANEOUS
  Administered 2020-03-21 (×2): 4 [IU] via SUBCUTANEOUS
  Administered 2020-03-21: 3 [IU] via SUBCUTANEOUS
  Administered 2020-03-22: 4 [IU] via SUBCUTANEOUS
  Administered 2020-03-22: 3 [IU] via SUBCUTANEOUS
  Administered 2020-03-22: 4 [IU] via SUBCUTANEOUS
  Administered 2020-03-23: 7 [IU] via SUBCUTANEOUS
  Administered 2020-03-24: 4 [IU] via SUBCUTANEOUS

## 2020-03-09 MED ORDER — SODIUM CHLORIDE 0.9 % IV SOLN
1000.0000 mg | Freq: Every day | INTRAVENOUS | Status: AC
  Administered 2020-03-09 – 2020-03-13 (×5): 1000 mg via INTRAVENOUS
  Filled 2020-03-09 (×5): qty 8

## 2020-03-09 MED ORDER — INSULIN ASPART 100 UNIT/ML ~~LOC~~ SOLN
0.0000 [IU] | Freq: Every day | SUBCUTANEOUS | Status: DC
  Administered 2020-03-10: 2 [IU] via SUBCUTANEOUS
  Administered 2020-03-11: 5 [IU] via SUBCUTANEOUS
  Administered 2020-03-12: 4 [IU] via SUBCUTANEOUS
  Administered 2020-03-13 – 2020-03-22 (×2): 2 [IU] via SUBCUTANEOUS

## 2020-03-09 MED ORDER — INSULIN ASPART 100 UNIT/ML ~~LOC~~ SOLN
8.0000 [IU] | Freq: Three times a day (TID) | SUBCUTANEOUS | Status: DC
  Administered 2020-03-09 – 2020-03-24 (×41): 8 [IU] via SUBCUTANEOUS

## 2020-03-09 NOTE — Progress Notes (Addendum)
Physical Therapy Session Note  Patient Details  Name: Monica [name] MRN: [id number] Date of Birth: July 19, 1951  Today's Date: 03/09/2020 PT Individual Time: 1315-1415 PT Individual Time Calculation (min): 60 min  and Today's Date: 03/09/2020 PT Concurrent Time: 1415-1440 PT Concurrent Time Calculation (min): 25 min  Short Term Goals: Week 1:  PT Short Term Goal 1 (Week 1): pt will perform bed mobility with min A PT Short Term Goal 2 (Week 1): Pt will transfer sit<>stand with LRAD mod A PT Short Term Goal 3 (Week 1): Pt will transfer bed<>WC with LRAD mod A  Skilled Therapeutic Interventions/Progress Updates:    Due to mix up with meal tray, pt received later tray and request time to finish lunch, so later start. Discussed and educated on overall progress, goals, and plan of care while pt finishing. Reports she has different shoes she wanted to try today (no AFO's) and so far she likes them. Did not increased edema in B feet and removed shoes and elevated in bed at end of session.   Mod assist for initial sit > stand with RW with facilitation for anterior weightshift and stabilization to R knee due to R knee crossing midline and decreased knee extension. Pt requires 3 attempts stating she feels stiff being in the chair this long. Min assist to transfer with RW once up to mat table with cues for R foot placement. Warm up performed seated LAQ (decreased activation in RLE) x 5 reps each.  Holding large therapy ball, performed cross body diagonals to work on trunk and core strengthening, pelvic mobility, and dynamic balance x 10 reps each direction. Blocked practice NMR for sit <> stands with RW with focus on controlled movement on descent, RLE control (assist by PT), and anterior weightshift x 5 reps with mod assist. Fine motor NMR for hand coordination and strength manipulating small PVC pipe pieces and then donning gloves to clean items before putting back. Extra time due ot decreased coordination.  NMR during gait with RW x 25' with overall min assist and cues for upright posture, step length/foot placement, and assist with RW during turning at times or requiring intervention at trunk for balance.Nustep for reciprocal movement pattern retraining, focus on midline/neutral orientation of RLE (knee) x 15 min total on level 3 (1 rest break after 10 min) with BUE and BLE.  Mod transfers with RW and min once in standing for transfer off of nustep > w/c and then w/c > bed with cues for upright posture and min assist for dynamic balance, decreased RLE coordination. Required mod assist to lift BLE into the bed to return to supine.   Pt overall reports being pleased with progress today.   Therapy Documentation Precautions:  Precautions Precautions: Fall Precaution Comments: weak and unsteady RLE Restrictions Weight Bearing Restrictions: No  Pain:  Denies pain. Reports new medication has helped a lot.     Therapy/Group: Individual Therapy  Karolee Stamps Darrol Poke, PT, DPT, CBIS  03/09/2020, 2:59 PM

## 2020-03-09 NOTE — Progress Notes (Addendum)
Physical Therapy Session Note  Patient Details  Name: Monica [name] MRN: [id number] Date of Birth: November 19, 1950  Today's Date: 03/09/2020 PT Individual Time: 1036-1110 PT Individual Time Calculation (min): 34 min   Short Term Goals: Week 1:  PT Short Term Goal 1 (Week 1): pt will perform bed mobility with min A PT Short Term Goal 2 (Week 1): Pt will transfer sit<>stand with LRAD mod A PT Short Term Goal 3 (Week 1): Pt will transfer bed<>WC with LRAD mod A  Skilled Therapeutic Interventions/Progress Updates:    Pt reports that her new pain medication has worked really good, no pain at this time. Tx: pt able to doff Lt sock independent, min assist donning, Rt total assist for don/doff. Pt refuses use of AFOs and tennis shoes but requests to use new shoes. Total assist with shoehorn to don. Txfers: sit<>stand min assist from w/c, Gait: amb 40 ft X2 with rw and CGA. Pt with difficulty advancing LEs with poor dorsiflexion but able to perform. Increased difficulty with turns, cues for safety with foot position. Requiring seated rest between amb attempts. Following session pt returned to w/c, declines doffing shoes, chair alarm on and all needs in reach.   Therapy Documentation Precautions:  Precautions Precautions: Fall Precaution Comments: weak and unsteady RLE Restrictions Weight Bearing Restrictions: No Pain:  0/10   Therapy/Group: Individual Therapy  Delton See 03/09/2020, 1:32 PM

## 2020-03-09 NOTE — Progress Notes (Signed)
Occupational Therapy Session Note  Patient Details  Name: Monica [name] MRN: [id number] Date of Birth: Mar 15, 1951  Today's Date: 03/09/2020 OT Individual Time: 1103-1200 OT Individual Time Calculation (min): 57 min   Short Term Goals: Week 1:  OT Short Term Goal 1 (Week 1): Pt will complete toilet transfer with min assist. OT Short Term Goal 2 (Week 1): Pt will utilize AE to complete LB dressing with mod A OT Short Term Goal 3 (Week 1): Pt will engage in functional task in standing for 2 minutes to incresae endurance for ADLs.  Skilled Therapeutic Interventions/Progress Updates:    Pt greeted seated in wc and agreeable to OT treatment session. Pt with much brighter affect today and excited for therapy. Pt stated she was in no pain and her new medication is working for her nerve pain in her legs. Pt also reported she just ambulated with PT. Pt declined bathing/dressing today. Pt brought down to therapy gym and completed stand-pivot to therapy mat with RW. Pt needed increased time to initiate, but was able to stand with min A. Pt then took a few steps forward, then turned to sit on mat with min A. Worked on sit<>stand with focus on powering up and R knee control. Facilitation for hip extension. Pt needed min A to maintain standing balance when removing unilateral UE to grab card and place on matching board. Pt took seated rest breaks in between placement of 2 cards. Pt pivoted back to wc with min A. Pt completed 5 mins x2 on [employer] arm bike for UB strengthening. Pt returned to room and left seated in wc with alarm belt on, call bell in reach, and needs met.   Therapy Documentation Precautions:  Precautions Precautions: Fall Precaution Comments: weak and unsteady RLE Restrictions Weight Bearing Restrictions: No Pain: Denies pain!  Therapy/Group: Individual Therapy  Valma Cava 03/09/2020, 11:44 AM

## 2020-03-09 NOTE — Progress Notes (Signed)
[place] PHYSICAL MEDICINE & REHABILITATION PROGRESS NOTE  Subjective/Complaints: Pain is much improved with Gabapentin. Patient asks that I speak with her husband as well and she calls him. He is very worried that she has MS. She has not had an MRI of her brain in 15 years and is very claustrophobic.   ROS: Denies CP, shortness of breath, nausea, vomiting, diarrhea.  Objective: Vital Signs: Blood pressure (!) 146/78, pulse 94, temperature 98.5 F (36.9 C), resp. rate 17, height 5\' 4"  (1.626 m), weight 57.8 kg, SpO2 97 %. No results found. No results for input(s): WBC, HGB, HCT, PLT in the last 72 hours. No results for input(s): NA, K, CL, CO2, GLUCOSE, BUN, CREATININE, CALCIUM in the last 72 hours.  Physical Exam: BP (!) 146/78 (BP Location: Left Arm)   Pulse 94   Temp 98.5 F (36.9 C)   Resp 17   Ht 5\' 4"  (1.626 m)   Wt 57.8 kg   SpO2 97%   BMI 21.87 kg/m   General: Alert and oriented x 3, No apparent distress HEENT: Head is normocephalic, atraumatic, PERRLA, EOMI, sclera anicteric, oral mucosa pink and moist, dentition intact, ext ear canals clear,  Neck: Supple without JVD or lymphadenopathy Heart: Reg rate and rhythm. No murmurs rubs or gallops Chest: CTA bilaterally without wheezes, rales, or rhonchi; no distress Abdomen: Soft, non-tender, non-distended, bowel sounds positive. Extremities: No clubbing, cyanosis, or edema. Pulses are 2+ Skin: Right lateral thigh healing Psych: Normal mood.  Normal behavior. Musc: No edema in extremities.  No tenderness in extremities. Neuro: Alert Motor: Right lower extremity: Hip flexion, knee extension 0/5, ankle dorsiflexion 2-/5 Left lower extremity: Hip flexion, knee extension 4+/5, 0/5 ankle DF, 2- PF  Assessment/Plan: 1. 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  Physiatrist is providing close team  supervision and 24 hour management of active medical problems listed below.  Physiatrist and rehab team continue to assess barriers to discharge/monitor patient progress toward functional and medical goals  Care Tool:  Bathing    Body parts bathed by patient: Right arm, Left arm, Chest, Abdomen, Face, Right upper leg, Left upper leg, Front perineal area, Buttocks, Left lower leg   Body parts bathed by helper: Right lower leg     Bathing assist Assist Level: Minimal Assistance - Patient > 75%     Upper Body Dressing/Undressing Upper body dressing   What is the patient wearing?: Pull over shirt    Upper body assist Assist Level: Supervision/Verbal cueing    Lower Body Dressing/Undressing Lower body dressing      What is the patient wearing?: Pants     Lower body assist Assist for lower body dressing: Maximal Assistance - Patient 25 - 49%     Toileting Toileting    Toileting assist Assist for toileting: 2 Helpers     Transfers Chair/bed transfer  Transfers assist  Chair/bed transfer activity did not occur: Refused (due to 10/10 pain and fatigue)  Chair/bed transfer assist level: Minimal Assistance - Patient > 75%     Locomotion Ambulation   Ambulation assist   Ambulation activity did not occur: Refused (due to 10/10 pain and fatigue)  Assist level: Minimal Assistance - Patient > 75% Assistive device: Walker-rolling Max distance: 20   Walk 10 feet activity   Assist  Walk 10 feet activity did not occur: Refused (due to 10/10 pain and fatigue)  Assist level: Minimal Assistance -  Patient > 75% Assistive device: Walker-rolling   Walk 50 feet activity   Assist Walk 50 feet with 2 turns activity did not occur: Refused         Walk 150 feet activity   Assist Walk 150 feet activity did not occur: Refused         Walk 10 feet on uneven surface  activity   Assist Walk 10 feet on uneven surfaces activity did not occur: Refused (due to 10/10  pain and fatigue)         Wheelchair     Assist Will patient use wheelchair at discharge?: Yes Type of Wheelchair: Manual Wheelchair activity did not occur: Refused (due to 10/10 pain and fatigue)  Wheelchair assist level: Set up assist Max wheelchair distance: 150 ft    Wheelchair 50 feet with 2 turns activity    Assist    Wheelchair 50 feet with 2 turns activity did not occur: Refused (due to 10/10 pain and fatigue)   Assist Level: Set up assist   Wheelchair 150 feet activity     Assist Wheelchair 150 feet activity did not occur: Refused (due to 10/10 pain and fatigue)   Assist Level: Set up assist     Medical Problem List and Plan: 1. 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  Continue CIR PT, OT, SLP   7/29: was able to tolerate therapies much better today  IV steroids x5 days to assess if this provides any improvement in weakness. If no response, MS is highly unlikely. Patient is unable to tolerate MRI brain without anesthesia, but may be able to tolerate outpatient MRI if open. Biopsy negative.  2.  Antithrombotics: -DVT/anticoagulation:  Pharmaceutical: Continue Lovenox given limited ambulation.             -antiplatelet therapy: N/A 3. Chronic neck/back/Pain Management: Continue Methadone 20 mg tid, Pamelor 25 mg/hs and Nuycnta prn. Tizanidine tid prn for spasms.    7/29: Gabapentin has helped a lot. Makes her a little sleepy.   Monitor with increased exertion 4.  Mood: LCSW to follow for evaluation ad support.              -antipsychotic agents: N/A 5. Neuropsych: This patient is capable of making decisions on her own behalf. 6. Skin/Wound Care: Routine pressure relief measures.  7. Fluids/Electrolytes/Nutrition: Monitor I/O.   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  9.T2DM with hyperglycemia-poorly controlled: A1c- 8.6.   Levemir 5  units BID, increased to 8 BID on 7/25  Novolog 5 units tic ac             CBG (last 3)  Recent Labs    03/08/20 2129 03/09/20 0617 03/09/20 1202  GLUCAP 144* 127* 264*   7/28: Increase Levemir to 12U BID.  7/29: Increase mealtime glucose in anticipation of hyperglycemia secondary to IV steroids.  10. Normocytic anemia: Stable overall. Monitor for signs of infection.              Hemoglobin 10.1 on 7/24, 10.2 7/26 stable  Continue to monitor 11. Low calorie malnutrition: Continue supplement 12. Hyponatremia/ chronic Hypomagnesemia:   Sodium 132 on 7/24, 7/26  Mag normal 1.8 on 7/26 13. Abnormal LFTs: Resolved 14.  Drug-induced constipation: Dulcolax prn effective.   Improving  Increased bowel meds as necessary 15. Persistent daily HA: On topamax 100 mg/day.   Monitor with increased activity 16.  Thrombocytopenia  Platelets 146 on  7/24, 172 on 7/26  17. Insomnia: Melatonin 5mg  HS  >35 minutes spent in discussion with patient and husband regarding symptoms, medical history, response to Gabapentin, trial of IV steroids, past benefit from occipital nerve block, and in examination of patient.   LOS: 6 days A FACE TO FACE EVALUATION WAS PERFORMED  Pamella Samons 03/09/2020, 2:48 PM

## 2020-03-10 ENCOUNTER — Inpatient Hospital Stay (HOSPITAL_COMMUNITY): Payer: Medicare Other | Admitting: Physical Therapy

## 2020-03-10 ENCOUNTER — Inpatient Hospital Stay (HOSPITAL_COMMUNITY): Payer: Medicare Other | Admitting: Occupational Therapy

## 2020-03-10 DIAGNOSIS — G610 Guillain-Barre syndrome: Secondary | ICD-10-CM | POA: Diagnosis not present

## 2020-03-10 LAB — GLUCOSE, CAPILLARY
Glucose-Capillary: 332 mg/dL — ABNORMAL HIGH (ref 70–99)
Glucose-Capillary: 384 mg/dL — ABNORMAL HIGH (ref 70–99)
Glucose-Capillary: 291 mg/dL — ABNORMAL HIGH (ref 70–99)
Glucose-Capillary: 245 mg/dL — ABNORMAL HIGH (ref 70–99)

## 2020-03-10 MED ORDER — INSULIN DETEMIR 100 UNIT/ML ~~LOC~~ SOLN
13.0000 [IU] | Freq: Two times a day (BID) | SUBCUTANEOUS | Status: DC
  Administered 2020-03-10 – 2020-03-12 (×4): 13 [IU] via SUBCUTANEOUS
  Filled 2020-03-10 (×6): qty 0.13

## 2020-03-10 NOTE — Progress Notes (Signed)
Physical Therapy Session Note  Patient Details  Name: Monica [name] MRN: [id number] Date of Birth: [birth year]  Today's Date: 03/10/2020 PT Individual Time: 1302-1345 PT Individual Time Calculation (min): 43 min   Short Term Goals: Week 1:  PT Short Term Goal 1 (Week 1): pt will perform bed mobility with min A PT Short Term Goal 2 (Week 1): Pt will transfer sit<>stand with LRAD mod A PT Short Term Goal 3 (Week 1): Pt will transfer bed<>WC with LRAD mod A  Skilled Therapeutic Interventions/Progress Updates: Pt presents sitting in recliner, agreeable to therapy.  Pt transferred sit to stand w/ min A and verbal cues for hand placement.  Pt amb to w/c w/ min A and RW.  Pt amb multiple trials w/ RW during rx up to 25' w/ verbal cues for BOS.  Pt amb w/o AFOs, wishes not to use.  Pt attempted stair training x (2)  6" stairs w/ B rails and mod A.  Stair training based on discussion w/ OT re: pt wish to negotiate stairs and multiple falls w/ spouse prior to hospitalization.  Pt has stairs at this home as well as new house they are building in [place], unknown rails.  Pt returned to room and amb across room to recliner w/ min A and all needs in reach.      Therapy Documentation Precautions:  Precautions Precautions: Fall Precaution Comments: weak and unsteady RLE Restrictions Weight Bearing Restrictions: No General:   Vital Signs:   Pain:   Mobility:   Locomotion :    Trunk/Postural Assessment :    Balance:   Exercises:   Other Treatments:      Therapy/Group: Individual Therapy  [name] 03/10/2020, 1:47 PM

## 2020-03-10 NOTE — Progress Notes (Signed)
Occupational Therapy Session Note  Patient Details  Name: Monica [name] MRN: [id number] Date of Birth: [birth year]  Today's Date: 03/10/2020 OT Individual Time: 1130-1208 OT Individual Time Calculation (min): 38 min    Short Term Goals: Week 1:  OT Short Term Goal 1 (Week 1): Pt will complete toilet transfer with min assist. OT Short Term Goal 2 (Week 1): Pt will utilize AE to complete LB dressing with mod A OT Short Term Goal 3 (Week 1): Pt will engage in functional task in standing for 2 minutes to incresae endurance for ADLs.  Skilled Therapeutic Interventions/Progress Updates:    Pt received in recliner spending time with chaplain. They needed some more time to talk so returned at 1130 to start session.  Pt seen this session to focus on BLE strength with chair push ups moving slowly in 2 counts up and 2 down for 8 reps x 2 sets.  Pt has tight R calf with decreased dorsiflexion.  PROM to calf and then pt worked on AROM of heel raises in sitting, hip abd/add.  Spent time talking about management of MS, energy conservation, activity adjustment.  Pt resting in recliner with all needs met.    Therapy Documentation Precautions:  Precautions Precautions: Fall Precaution Comments: weak and unsteady RLE Restrictions Weight Bearing Restrictions: No       Pain:5/10 R leg, premedicated     Therapy/Group: Individual Therapy  [place] 03/10/2020, 12:58 PM

## 2020-03-10 NOTE — Progress Notes (Signed)
Physical Therapy Session Note  Patient Details  Name: Monica [name] MRN: [id number] Date of Birth: 11/29/1950  Today's Date: 03/10/2020 PT Individual Time: 1308-6578 PT Individual Time Calculation (min): 45 min   Short Term Goals: Week 1:  PT Short Term Goal 1 (Week 1): pt will perform bed mobility with min A PT Short Term Goal 2 (Week 1): Pt will transfer sit<>stand with LRAD mod A PT Short Term Goal 3 (Week 1): Pt will transfer bed<>WC with LRAD mod A  Skilled Therapeutic Interventions/Progress Updates:  Pt received in recliner & agreeable to tx, reporting new pain meds are working very well. Provided pt with BLE ELR for edema management when sitting up in w/c. Sit<>stand with min assist & pt ambulates ~5 ft to w/c with RW & min assist with pt demonstrating B foot drop. Pt propels w/c room>dayroom with BUE & set up assist. Sit<>stand with RW & min assist & gait x 30 ft + 80 ft with RW & min assist with pt demonstrating B foot drop & absent heel strike but overall improved strength & endurance. Pt with less medially knee collapsing RLE during sit<>stand on this date. At end of session pt left in w/c with chair alarm donned, husband in room to supervise.  Therapy Documentation Precautions:  Precautions Precautions: Fall Precaution Comments: weak and unsteady RLE Restrictions Weight Bearing Restrictions: No  Pain: Migraine 6-7/10 - pt states she's premedicated BLE 1/10 - rest breaks provided PRN   Therapy/Group: Individual Therapy  Sandi Mariscal 03/10/2020, 3:55 PM

## 2020-03-10 NOTE — Progress Notes (Signed)
Chaplain engaged in initial visit with [place]. During visit, Ileana voiced her frustration and anger.  She wants answers concerning what has left her immobile.  She expressed that she has been on this journey of figuring out what is happening to her body for months. She also expressed somewhat feeling like a burden to her husband who has some of his own health challenges but comes to see her in the hospital while still working.  She stated that she has not been made to feel that way by him, or others, but that it is hard to go from a person doing everything for everybody else to being the one that needs others to take care of her.  Chaplain uplifted how hard it can be to transition from being the caregiver to being the one in need of care and her need for solutions and answers.  Chaplain offered the ministries of presence and listening during this time. Chaplain could assess the grief she holds over not being as independent as she used to be and her health being in a state that she cannot explain. Francys verbalized wanting to understand if the current measures that are being taken for her health are even beneficial to may be wrong with her body.  During visit, chaplain posed the question to [facility], "What does life look like for you if you never receive any answers?" Khyleigh verbalized that she and her husband would not accept not having any answers.  Chaplain could assess Cedra's willingness to keep going but is finding it hard to live in this way without any knowledge of what is causing her pain and immobility.  Andalyn voiced, "Why would God do this to me?" Chaplain could assess Deven's wrestling with her faith and theology concerning her current state of health.    Chaplain offered the ministries of presence and listening.  Chaplain will follow-up.

## 2020-03-10 NOTE — Progress Notes (Signed)
[facility] PHYSICAL MEDICINE & REHABILITATION PROGRESS NOTE  Subjective/Complaints: Pain continues to be improved with Gabapentin and it does not make her too sleepy. She is tolerating high dose steroids well, CBG was elevated this morning, insulin was increased  ROS: Denies CP, shortness of breath, nausea, vomiting, diarrhea.  Objective: Vital Signs: Blood pressure 125/80, pulse 82, temperature 98.2 F (36.8 C), temperature source Oral, resp. rate 18, height 5\' 4"  (1.626 m), weight 57.8 kg, SpO2 93 %. No results found. No results for input(s): WBC, HGB, HCT, PLT in the last 72 hours. No results for input(s): NA, K, CL, CO2, GLUCOSE, BUN, CREATININE, CALCIUM in the last 72 hours.  Physical Exam: BP 125/80 (BP Location: Left Arm)    Pulse 82    Temp 98.2 F (36.8 C) (Oral)    Resp 18    Ht 5\' 4"  (1.626 m)    Wt 57.8 kg    SpO2 93%    BMI 21.87 kg/m   General: Alert and oriented x 3, No apparent distress HEENT: Head is normocephalic, atraumatic, PERRLA, EOMI, sclera anicteric, oral mucosa pink and moist, dentition intact, ext ear canals clear,  Neck: Supple without JVD or lymphadenopathy Heart: Reg rate and rhythm. No murmurs rubs or gallops Chest: CTA bilaterally without wheezes, rales, or rhonchi; no distress Abdomen: Soft, non-tender, non-distended, bowel sounds positive. Extremities: No clubbing, cyanosis, or edema. Pulses are 2+ Skin: Right lateral thigh healing Psych: Normal mood.  Normal behavior. Musc: No edema in extremities.  No tenderness in extremities. Neuro: Alert Motor: Right lower extremity: Hip flexion, knee extension 0/5, ankle dorsiflexion 2-/5 Left lower extremity: Hip flexion, knee extension 4+/5, 0/5 ankle DF, 2- PF  Assessment/Plan: 1. 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  Physiatrist is providing close team supervision and 24  hour management of active medical problems listed below.  Physiatrist and rehab team continue to assess barriers to discharge/monitor patient progress toward functional and medical goals  Care Tool:  Bathing    Body parts bathed by patient: Right arm, Left arm, Chest, Abdomen, Face, Right upper leg, Left upper leg, Front perineal area, Buttocks, Left lower leg   Body parts bathed by helper: Right lower leg     Bathing assist Assist Level: Minimal Assistance - Patient > 75%     Upper Body Dressing/Undressing Upper body dressing   What is the patient wearing?: Pull over shirt    Upper body assist Assist Level: Supervision/Verbal cueing    Lower Body Dressing/Undressing Lower body dressing      What is the patient wearing?: Pants     Lower body assist Assist for lower body dressing: Maximal Assistance - Patient 25 - 49%     Toileting Toileting    Toileting assist Assist for toileting: 2 Helpers     Transfers Chair/bed transfer  Transfers assist  Chair/bed transfer activity did not occur: Refused (due to 10/10 pain and fatigue)  Chair/bed transfer assist level: Minimal Assistance - Patient > 75%     Locomotion Ambulation   Ambulation assist   Ambulation activity did not occur: Refused (due to 10/10 pain and fatigue)  Assist level: Minimal Assistance - Patient > 75% Assistive device: Walker-rolling Max distance: 30   Walk 10 feet activity   Assist  Walk 10 feet activity did not occur: Refused (due to 10/10 pain and fatigue)  Assist level: Minimal Assistance - Patient > 75% Assistive device:  Walker-rolling   Walk 50 feet activity   Assist Walk 50 feet with 2 turns activity did not occur: Refused         Walk 150 feet activity   Assist Walk 150 feet activity did not occur: Refused         Walk 10 feet on uneven surface  activity   Assist Walk 10 feet on uneven surfaces activity did not occur: Refused (due to 10/10 pain and  fatigue)         Wheelchair     Assist Will patient use wheelchair at discharge?: Yes Type of Wheelchair: Manual Wheelchair activity did not occur: Refused (due to 10/10 pain and fatigue)  Wheelchair assist level: Set up assist Max wheelchair distance: 150 ft    Wheelchair 50 feet with 2 turns activity    Assist    Wheelchair 50 feet with 2 turns activity did not occur: Refused (due to 10/10 pain and fatigue)   Assist Level: Set up assist   Wheelchair 150 feet activity     Assist Wheelchair 150 feet activity did not occur: Refused (due to 10/10 pain and fatigue)   Assist Level: Set up assist     Medical Problem List and Plan: 1. 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  Continue CIR PT, OT, SLP   7/29: was able to tolerate therapies much better today  IV steroids x5 days to assess if this provides any improvement in weakness. If no response, MS is highly unlikely. Patient is unable to tolerate MRI brain without anesthesia, but may be able to tolerate outpatient MRI if open. Biopsy negative.  2.  Antithrombotics: -DVT/anticoagulation:  Pharmaceutical: Continue Lovenox given limited ambulation.             -antiplatelet therapy: N/A 3. Chronic neck/back/Pain Management: Continue Methadone 20 mg tid, Pamelor 25 mg/hs and Nuycnta prn. Tizanidine tid prn for spasms.    7/29: Gabapentin has helped a lot. Makes her a little sleepy.   7/30: Man benefit from repeat occipital nerve blocks in outpatient setting as these provided benefit 8 years ago. Although ablation was not helpful at that time, perhaps can be reattempted in the outpatient setting.   Monitor with increased exertion 4.  Mood: LCSW to follow for evaluation ad support.              -antipsychotic agents: N/A 5. Neuropsych: This patient is capable of making decisions on her own behalf. 6. Skin/Wound Care: Routine pressure relief  measures.  7. Fluids/Electrolytes/Nutrition: Monitor I/O.   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  9.T2DM with hyperglycemia-poorly controlled: A1c- 8.6.   Levemir 5 units BID, increased to 8 BID on 7/25  Novolog 5 units tic ac             CBG (last 3)  Recent Labs    03/09/20 2121 03/10/20 0614 03/10/20 1137  GLUCAP 132* 332* 384*   7/28: Increase Levemir to 12U BID.  7/29: Increase mealtime glucose in anticipation of hyperglycemia secondary to IV steroids.  7/30: CBG of 384 this morning. Increase Levemir to 13U BID. 10. Normocytic anemia: Stable overall. Monitor for signs of infection.              Hemoglobin 10.1 on 7/24, 10.2 7/26 stable  Continue to monitor 11. Low calorie malnutrition: Continue supplement 12. Hyponatremia/ chronic Hypomagnesemia:   Sodium 132 on 7/24, 7/26  Mag normal 1.8 on  7/26 13. Abnormal LFTs: Resolved 14.  Drug-induced constipation: Dulcolax prn effective.   Improving  Increased bowel meds as necessary 15. Persistent daily HA: On topamax 100 mg/day.   Monitor with increased activity 16.  Thrombocytopenia  Platelets 146 on 7/24, 172 on 7/26  17. Insomnia: Melatonin 5mg  HS   LOS: 7 days A FACE TO FACE EVALUATION WAS PERFORMED  Anton Cheramie P Kooper [place] 03/10/2020, 2:22 PM

## 2020-03-10 NOTE — Progress Notes (Signed)
Occupational Therapy Session Note  Patient Details  Name: Monica [name] MRN: [id number] Date of Birth: [birth year]  Today's Date: 03/10/2020 OT Individual Time: 0905-1000 OT Individual Time Calculation (min): 55 min    Short Term Goals: Week 1:  OT Short Term Goal 1 (Week 1): Pt will complete toilet transfer with min assist. OT Short Term Goal 2 (Week 1): Pt will utilize AE to complete LB dressing with mod A OT Short Term Goal 3 (Week 1): Pt will engage in functional task in standing for 2 minutes to incresae endurance for ADLs. Week 2:    Week 3:     Skilled Therapeutic Interventions/Progress Updates:    1:1 PT received in the bed. Pt talking about her worrying about having MS. Provided some encouragement. Pt performed sit to stand from EOB with min A. Pt ambulated to the bathroom with min to mod A (mod A for navigating bathroom threshold). Cues needed for management of RW/ positioning of RW. PT with heavy reliance through UEs on RW making it more difficult to advance it forward. Pt showered sitting on tub bench with lateral leans with setup. Pt needed A to shave her right LE; she was able to do her left LE. Ambulated to the toilet from shower with RW with min mod A - again with heavy reliance on RW with UEs. Donned shirt with setup. Sitting on the toilet pt donned LB clothing with A to pull pants up- unable to stand without bilateral UE support to assist with clothing management. STand pivot transfer with RW with min A into w/c. Left sitting in w/c to complete grooming mod I   Therapy Documentation Precautions:  Precautions Precautions: Fall Precaution Comments: weak and unsteady RLE Restrictions Weight Bearing Restrictions: No Pain:  reports she is feeling better   Therapy/Group: Individual Therapy  Roney Mans [facility] 03/10/2020, 9:25 AM

## 2020-03-11 ENCOUNTER — Inpatient Hospital Stay (HOSPITAL_COMMUNITY): Payer: Medicare Other | Admitting: Physical Therapy

## 2020-03-11 ENCOUNTER — Encounter (HOSPITAL_COMMUNITY): Payer: Medicare Other | Admitting: Occupational Therapy

## 2020-03-11 DIAGNOSIS — G610 Guillain-Barre syndrome: Secondary | ICD-10-CM | POA: Diagnosis not present

## 2020-03-11 LAB — GLUCOSE, CAPILLARY
Glucose-Capillary: 243 mg/dL — ABNORMAL HIGH (ref 70–99)
Glucose-Capillary: 359 mg/dL — ABNORMAL HIGH (ref 70–99)
Glucose-Capillary: 352 mg/dL — ABNORMAL HIGH (ref 70–99)

## 2020-03-11 MED ORDER — BISACODYL 5 MG PO TBEC
10.0000 mg | DELAYED_RELEASE_TABLET | Freq: Every day | ORAL | Status: DC
  Administered 2020-03-11 – 2020-03-24 (×13): 10 mg via ORAL
  Filled 2020-03-11 (×15): qty 2

## 2020-03-11 NOTE — Plan of Care (Signed)
°  Problem: RH SKIN INTEGRITY Goal: RH STG SKIN FREE OF INFECTION/BREAKDOWN Description: Skin to remain free of infection with mod assist Outcome: Progressing Goal: RH STG MAINTAIN SKIN INTEGRITY WITH ASSISTANCE Description: STG Maintain Skin Integrity With  mod Assistance. Outcome: Progressing

## 2020-03-11 NOTE — Progress Notes (Signed)
Occupational Therapy Session Note  Patient Details  Name: Monica [name] MRN: [id number] Date of Birth: 20-Nov-1950  Today's Date: 03/11/2020 OT Group Time: 1100-1200 OT Group Time Calculation (min): 60 min  Skilled Therapeutic Interventions/Progress Updates:    Pt engaged in therapeutic w/c level dance group focusing on patient choice, UE/LE strengthening, salience, activity tolerance, and social participation. Pt was guided through various dance-based exercises involving UEs/LEs and trunk. All music was selected by group members. Individual emphasis placed on Rt LE NMR and activity tolerance. Pt was very participative in group, either coordinating both UEs together or both LEs. She was very focused on improving controlled movement of her Rt LE, min vcs for incorporating active assist techniques and trying new active techniques to improve movement. At end of session she self propelled back to the room 75% of the way and then OT escorted her remainder of the way to her room. Left her with all needs within reach and safety belt fastened.    Therapy Documentation Precautions:  Precautions Precautions: Fall Precaution Comments: weak and unsteady RLE Restrictions Weight Bearing Restrictions: No Pain: no s/s pain during tx Pain Assessment Pain Scale: 0-10 Pain Score: 0-No pain ADL:     Therapy/Group: Group Therapy  Charisma Charlot A Shanedra Lave 03/11/2020, 12:33 PM

## 2020-03-11 NOTE — Progress Notes (Signed)
Physical Therapy Weekly Progress Note  Patient Details  Name: Monica [name] MRN: [id number] Date of Birth: 03-13-1951  Beginning of progress report period: March 04, 2020 End of progress report period: March 11, 2020  Today's Date: 03/11/2020 PT Individual Time: 0813-0908 PT Individual Time Calculation (min): 55 min   Patient has met 2 of 3 short term goals.  Pt is making steady progress towards LTG's. Pt can currently complete sit<>stand transfers with min/mod assist & ambulate with RW & min assist. Pt continues to demonstrate BLE foot drop & compensatory patterns during gait - primary PT plans to discuss potential use of B AFO's with MD upon his return next week (please see previous note for further details). Pt has also demonstrated improvement in functional mobility over past couple days as she reports new pain medication is very effective. Pt would benefit from continued skilled PT treatment to focus on deficits noted above/below, to increase independence with functional mobility & for pt/caregiver education prior to d/c.   Patient continues to demonstrate the following deficits muscle weakness, decreased cardiorespiratory endurance, decreased coordination, decreased memory, and decreased standing balance, decreased postural control and decreased balance strategies and therefore will continue to benefit from skilled PT intervention to increase functional independence with mobility.  Patient progressing toward long term goals..  Continue plan of care.  PT Short Term Goals Week 1:  PT Short Term Goal 1 (Week 1): pt will perform bed mobility with min A PT Short Term Goal 1 - Progress (Week 1): Progressing toward goal PT Short Term Goal 2 (Week 1): Pt will transfer sit<>stand with LRAD mod A PT Short Term Goal 2 - Progress (Week 1): Met PT Short Term Goal 3 (Week 1): Pt will transfer bed<>WC with LRAD mod A PT Short Term Goal 3 - Progress (Week 1): Met  Week 2:  PT Short Term Goal 1 (Week  2): Pt will complete bed<>chair transfers with LRAD & CGA. PT Short Term Goal 2 (Week 2): Pt will negotiate 4 steps with 1 rail per home set up with mod assist. PT Short Term Goal 3 (Week 2): Pt will complete car transfer with LRAD & mod assist.  Skilled Therapeutic Interventions/Progress Updates:  Pt received in bed & tearful over new unrated pain in RUE in addition to pain in BLE. PT educated & performed belly breathing exercises with pt & engaged pt in distracting conversation until pt is laughing and appearing in a better mood & less focused on pain. Pt transfers supine>sitting EOB with hospital bed features & supervision. Pt attempts to don shoes but ultimately requires assistance to slide heels into shoes. Sit>stand from elevated EOB with min assist & pt ambulates in room>bathroom doorway before becoming anxious re: negotiating bathroom threshold & requiring max assist to prevent LOB & ultimately sits in w/c. Pt positioned by toilet & completes stand pivot w/c>toilet with grab bar & max assist, toilet>w/c with mod assist with PT providing total assist for clothing management. Pt with continent void on toilet with supervision for sitting balance & performed peri hygiene without assistance. Pt completes hand hygiene at sink from w/c level with set up assist.  Pt then report small skin tear on R forearm & nurse made aware & donned band aid. Transported pt to dayroom & pt transfers sit>stand with min assist & pt ambulates 20 ft with RW & min assist with BLE foot drop & absent heel strike with extra time & encouragement when completing turns. Pt utilized nu-step on level  2 x 8 minutes with all four extremities with task focusing on global strengthening, endurance training, & coordination of reciprocal movements. Pt completes stand pivot nu-step>w/c with RW & min assist with extra time for maneuvering RW 2/2 heavy lean on AD. Pt left in w/c with chair alarm donned, call bell & all needs in reach.  Therapy  Documentation Precautions:  Precautions Precautions: Fall Precaution Comments: weak and unsteady RLE Restrictions Weight Bearing Restrictions: No  Therapy/Group: Individual Therapy  Waunita Schooner 03/11/2020, 9:15 AM

## 2020-03-11 NOTE — Progress Notes (Signed)
[place] PHYSICAL MEDICINE & REHABILITATION PROGRESS NOTE  Subjective/Complaints:   Pt reports doing better with PT, but thought that was PT, and not steroids. Doesn't think steroids doing much.  Now having pain going into RUE- and cold/tingling. And numb- uncomfortable.  LBM per chart 3 days- pt says 5-6 days- wants to get Dulcolax pills- ordered.  Refused mg citrate or sorbitol.    ROS:  Pt denies SOB, abd pain, CP, N/V/C/D, and vision changes   Objective: Vital Signs: Blood pressure (!) 131/82, pulse 84, temperature 98.4 F (36.9 C), temperature source Oral, resp. rate 14, height 5\' 4"  (1.626 m), weight 60.7 kg, SpO2 100 %. No results found. No results for input(s): WBC, HGB, HCT, PLT in the last 72 hours. No results for input(s): NA, K, CL, CO2, GLUCOSE, BUN, CREATININE, CALCIUM in the last 72 hours.  Physical Exam: BP (!) 131/82 (BP Location: Left Arm)   Pulse 84   Temp 98.4 F (36.9 C) (Oral)   Resp 14   Ht 5\' 4"  (1.626 m)   Wt 60.7 kg   SpO2 100%   BMI 22.97 kg/m   General: Alert, sitting up in bedside chair, appropriate, NAD HEENT: conjugate gaze  Neck: Supple without JVD or lymphadenopathy Heart: RRR Chest: CTA B/L- no W/R/R- good air movement Abdomen: soft, NT, distended, hypoactive BS Extremities: No clubbing, cyanosis, or edema. Pulses are 2+ Skin: Right lateral thigh healing- almost healed; hands are not cold to touch- but feel so to pt. Psych: Normal mood.  Normal behavior. Musc: No edema in extremities.  No tenderness in extremities. Neuro: Alert Motor: Right lower extremity: Hip flexion, knee extension 0/5, ankle dorsiflexion 2-/5 Left lower extremity: Hip flexion, knee extension 4+/5, 0/5 ankle DF, 2- PF  Assessment/Plan: 1. 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  Physiatrist is providing close team supervision and 24  hour management of active medical problems listed below.  Physiatrist and rehab team continue to assess barriers to discharge/monitor patient progress toward functional and medical goals  Care Tool:  Bathing    Body parts bathed by patient: Right arm, Left arm, Chest, Abdomen, Face, Right upper leg, Left upper leg, Front perineal area, Buttocks, Left lower leg   Body parts bathed by helper: Right lower leg     Bathing assist Assist Level: Minimal Assistance - Patient > 75%     Upper Body Dressing/Undressing Upper body dressing   What is the patient wearing?: Pull over shirt    Upper body assist Assist Level: Supervision/Verbal cueing    Lower Body Dressing/Undressing Lower body dressing      What is the patient wearing?: Pants     Lower body assist Assist for lower body dressing: Maximal Assistance - Patient 25 - 49%     Toileting Toileting    Toileting assist Assist for toileting: 2 Helpers     Transfers Chair/bed transfer  Transfers assist  Chair/bed transfer activity did not occur: Refused (due to 10/10 pain and fatigue)  Chair/bed transfer assist level: Minimal Assistance - Patient > 75%     Locomotion Ambulation   Ambulation assist   Ambulation activity did not occur: Refused (due to 10/10 pain and fatigue)  Assist level: Minimal Assistance - Patient > 75% Assistive device: Walker-rolling Max distance: 20 ft   Walk 10 feet activity   Assist  Walk 10 feet activity did not occur: Refused (due to 10/10 pain and  fatigue)  Assist level: Minimal Assistance - Patient > 75% Assistive device: Walker-rolling   Walk 50 feet activity   Assist Walk 50 feet with 2 turns activity did not occur: Refused  Assist level: Minimal Assistance - Patient > 75% Assistive device: Walker-rolling    Walk 150 feet activity   Assist Walk 150 feet activity did not occur: Refused         Walk 10 feet on uneven surface  activity   Assist Walk 10 feet on  uneven surfaces activity did not occur: Refused (due to 10/10 pain and fatigue)         Wheelchair     Assist Will patient use wheelchair at discharge?: Yes Type of Wheelchair: Manual Wheelchair activity did not occur: Refused (due to 10/10 pain and fatigue)  Wheelchair assist level: Set up assist Max wheelchair distance: 150 ft    Wheelchair 50 feet with 2 turns activity    Assist    Wheelchair 50 feet with 2 turns activity did not occur: Refused (due to 10/10 pain and fatigue)   Assist Level: Set up assist   Wheelchair 150 feet activity     Assist Wheelchair 150 feet activity did not occur: Refused (due to 10/10 pain and fatigue)   Assist Level: Set up assist     Medical Problem List and Plan: 1. 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  Continue CIR PT, OT, SLP   7/29: was able to tolerate therapies much better today  IV steroids x5 days to assess if this provides any improvement in weakness. If no response, MS is highly unlikely. Patient is unable to tolerate MRI brain without anesthesia, but may be able to tolerate outpatient MRI if open. Biopsy negative.  2.  Antithrombotics: -DVT/anticoagulation:  Pharmaceutical: Continue Lovenox given limited ambulation.             -antiplatelet therapy: N/A 3. Chronic neck/back/Pain Management: Continue Methadone 20 mg tid, Pamelor 25 mg/hs and Nuycnta prn. Tizanidine tid prn for spasms.    7/29: Gabapentin has helped a lot. Makes her a little sleepy.   7/30: Man benefit from repeat occipital nerve blocks in outpatient setting as these provided benefit 8 years ago. Although ablation was not helpful at that time, perhaps can be reattempted in the outpatient setting.  7/31- now having nerve pain in RUE per pt- just changed meds, so wait for change today   Monitor with increased exertion 4.  Mood: LCSW to follow for evaluation ad support.               -antipsychotic agents: N/A 5. Neuropsych: This patient is capable of making decisions on her own behalf. 6. Skin/Wound Care: Routine pressure relief measures.  7. Fluids/Electrolytes/Nutrition: Monitor I/O.   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  9.T2DM with hyperglycemia-poorly controlled: A1c- 8.6.   Levemir 5 units BID, increased to 8 BID on 7/25  Novolog 5 units tic ac             CBG (last 3)  Recent Labs    03/10/20 1624 03/10/20 2111 03/11/20 1211  GLUCAP 291* 245* 243*   7/28: Increase Levemir to 12U BID.  7/29: Increase mealtime glucose in anticipation of hyperglycemia secondary to IV steroids.  7/30: CBG of 384 this morning. Increase Levemir to 13U BID.  7/31- BGs mid to high 200s- on 2-3rd day of IV steroids 10. Normocytic anemia: Stable overall. Monitor  for signs of infection.              Hemoglobin 10.1 on 7/24, 10.2 7/26 stable  Continue to monitor 11. Low calorie malnutrition: Continue supplement 12. Hyponatremia/ chronic Hypomagnesemia:   Sodium 132 on 7/24, 7/26  Mag normal 1.8 on 7/26 13. Abnormal LFTs: Resolved 14.  Drug-induced constipation: Dulcolax prn effective.   Improving  7/31- said 6+ days since LBM- will change dulcolax to daily and increase to 10 mg daily.   Increased bowel meds as necessary 15. Persistent daily HA: On topamax 100 mg/day.   Monitor with increased activity 16.  Thrombocytopenia  Platelets 146 on 7/24, 172 on 7/26  17. Insomnia: Melatonin 5mg  HS   LOS: 8 days A FACE TO FACE EVALUATION WAS PERFORMED  Monica [name] 03/11/2020, 4:01 PM

## 2020-03-12 DIAGNOSIS — G610 Guillain-Barre syndrome: Secondary | ICD-10-CM | POA: Diagnosis not present

## 2020-03-12 LAB — GLUCOSE, CAPILLARY
Glucose-Capillary: 227 mg/dL — ABNORMAL HIGH (ref 70–99)
Glucose-Capillary: 328 mg/dL — ABNORMAL HIGH (ref 70–99)
Glucose-Capillary: 290 mg/dL — ABNORMAL HIGH (ref 70–99)
Glucose-Capillary: 318 mg/dL — ABNORMAL HIGH (ref 70–99)

## 2020-03-12 MED ORDER — LIDOCAINE 5 % EX PTCH
2.0000 | MEDICATED_PATCH | CUTANEOUS | Status: DC
  Administered 2020-03-13 – 2020-03-24 (×11): 2 via TRANSDERMAL
  Filled 2020-03-12 (×12): qty 2

## 2020-03-12 MED ORDER — INSULIN DETEMIR 100 UNIT/ML ~~LOC~~ SOLN
15.0000 [IU] | Freq: Two times a day (BID) | SUBCUTANEOUS | Status: DC
  Administered 2020-03-12 – 2020-03-14 (×4): 15 [IU] via SUBCUTANEOUS
  Filled 2020-03-12 (×5): qty 0.15

## 2020-03-12 MED ORDER — LIDOCAINE 5 % EX PTCH
1.0000 | MEDICATED_PATCH | CUTANEOUS | Status: AC
  Administered 2020-03-12: 1 via TRANSDERMAL
  Filled 2020-03-12: qty 1

## 2020-03-12 NOTE — Progress Notes (Signed)
[place] PHYSICAL MEDICINE & REHABILITATION PROGRESS NOTE  Subjective/Complaints:   Pt refusing PRAFOs- "they feel like cement".   Has pound cake in bed- trying to push under blanket it appears.  Did have a big blowout on toilet/BSC this AM.  Still having pain in R side/abd- thought would improve after pooped- still there-  Also L foot above ankle and R hand is still very cold per pt- not to touch, but she feels it's cold.   Also needed 2 people to transfer her this AM.   ROS:   Pt denies SOB, abd pain, CP, N/V/C/D, and vision changes   Objective: Vital Signs: Blood pressure (!) 135/79, pulse 49, temperature 99.5 F (37.5 C), temperature source Oral, resp. rate 18, height 5\' 4"  (1.626 m), weight 60.7 kg, SpO2 98 %. No results found. No results for input(s): WBC, HGB, HCT, PLT in the last 72 hours. No results for input(s): NA, K, CL, CO2, GLUCOSE, BUN, CREATININE, CALCIUM in the last 72 hours.  Physical Exam: BP (!) 135/79 (BP Location: Left Arm)    Pulse 49    Temp 99.5 F (37.5 C) (Oral)    Resp 18    Ht 5\' 4"  (1.626 m)    Wt 60.7 kg    SpO2 98%    BMI 22.97 kg/m   General: Alert, sitting up in bed- on IV steroids this AM, NAD HEENT: conjugate gaze  Neck: Supple without JVD or lymphadenopathy Heart: RRR Chest: CTA B/L- no W/R/R- good air movement Abdomen: soft, TTP over lateral R side, ND, (+)BS Extremities: No clubbing, cyanosis, or edema. Pulses are 2+ Skin: Right lateral thigh healing- almost healed; hands are not cold to touch- but feel so to pt. Psych: Normal mood.  Normal behavior. Musc: No edema in extremities.  No tenderness in extremities. Neuro: Alert Motor: Right lower extremity: Hip flexion, knee extension 0/5, ankle dorsiflexion 2-/5 Left lower extremity: Hip flexion, knee extension 4+/5, 0/5 ankle DF, 2- PF  Assessment/Plan: 1. 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  Physiatrist is providing close team supervision and 24 hour management of active medical problems listed below.  Physiatrist and rehab team continue to assess barriers to discharge/monitor patient progress toward functional and medical goals  Care Tool:  Bathing    Body parts bathed by patient: Right arm, Left arm, Chest, Abdomen, Face, Right upper leg, Left upper leg, Front perineal area, Buttocks, Left lower leg   Body parts bathed by helper: Right lower leg     Bathing assist Assist Level: Minimal Assistance - Patient > 75%     Upper Body Dressing/Undressing Upper body dressing   What is the patient wearing?: Pull over shirt    Upper body assist Assist Level: Supervision/Verbal cueing    Lower Body Dressing/Undressing Lower body dressing      What is the patient wearing?: Pants     Lower body assist Assist for lower body dressing: Maximal Assistance - Patient 25 - 49%     Toileting Toileting    Toileting assist Assist for toileting: 2 Helpers     Transfers Chair/bed transfer  Transfers assist  Chair/bed transfer activity did not occur: Refused (due to 10/10 pain and fatigue)  Chair/bed transfer assist level: Minimal Assistance - Patient > 75%     Locomotion Ambulation   Ambulation assist   Ambulation activity did not occur: Refused (due to 10/10 pain and fatigue)  Assist level: Minimal Assistance - Patient > 75% Assistive device: Walker-rolling Max distance: 20 ft   Walk 10 feet activity   Assist  Walk 10 feet activity did not occur: Refused (due to 10/10 pain and fatigue)  Assist level: Minimal Assistance - Patient > 75% Assistive device: Walker-rolling   Walk 50 feet activity   Assist Walk 50 feet with 2 turns activity did not occur: Refused  Assist level: Minimal Assistance - Patient > 75% Assistive device: Walker-rolling    Walk 150 feet activity   Assist Walk 150 feet activity did not  occur: Refused         Walk 10 feet on uneven surface  activity   Assist Walk 10 feet on uneven surfaces activity did not occur: Refused (due to 10/10 pain and fatigue)         Wheelchair     Assist Will patient use wheelchair at discharge?: Yes Type of Wheelchair: Manual Wheelchair activity did not occur: Refused (due to 10/10 pain and fatigue)  Wheelchair assist level: Set up assist Max wheelchair distance: 150 ft    Wheelchair 50 feet with 2 turns activity    Assist    Wheelchair 50 feet with 2 turns activity did not occur: Refused (due to 10/10 pain and fatigue)   Assist Level: Set up assist   Wheelchair 150 feet activity     Assist Wheelchair 150 feet activity did not occur: Refused (due to 10/10 pain and fatigue)   Assist Level: Set up assist     Medical Problem List and Plan: 1. 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  Continue CIR PT, OT, SLP   7/29: was able to tolerate therapies much better today  IV steroids x5 days to assess if this provides any improvement in weakness. If no response, MS is highly unlikely. Patient is unable to tolerate MRI brain without anesthesia, but may be able to tolerate outpatient MRI if open. Biopsy negative.  8/1- refusing PRAFOs, in spite of poor ankle DF B/L- could lose Range of ankles if not careful  2.  Antithrombotics: -DVT/anticoagulation:  Pharmaceutical: Continue Lovenox given limited ambulation.             -antiplatelet therapy: N/A 3. Chronic neck/back/Pain Management: Continue Methadone 20 mg tid, Pamelor 25 mg/hs and Nuycnta prn. Tizanidine tid prn for spasms.    7/29: Gabapentin has helped a lot. Makes her a little sleepy.   7/30: Man benefit from repeat occipital nerve blocks in outpatient setting as these provided benefit 8 years ago. Although ablation was not helpful at that time, perhaps can be reattempted in the  outpatient setting.  7/31- now having nerve pain in RUE per pt- just changed meds, so wait for change today   8/1- also having nerve pain in R side- on abdomen/side- will try lidocaine patches 8am to 8pm- 2 patches  Monitor with increased exertion 4.  Mood: LCSW to follow for evaluation ad support.              -antipsychotic agents: N/A 5. Neuropsych: This patient is capable of making decisions on her own behalf. 6. Skin/Wound Care: Routine pressure relief measures.  7. Fluids/Electrolytes/Nutrition: Monitor I/O.   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  9.T2DM with hyperglycemia-poorly controlled: A1c- 8.6.   Levemir 5 units BID, increased to 8 BID on 7/25  Novolog 5 units tic ac  CBG (last 3)  Recent Labs    03/11/20 2106 03/12/20 0617 03/12/20 1139  GLUCAP 352* 227* 328*   7/28: Increase Levemir to 12U BID.  7/29: Increase mealtime glucose in anticipation of hyperglycemia secondary to IV steroids.  7/30: CBG of 384 this morning. Increase Levemir to 13U BID.  7/31- BGs mid to high 200s- on 2-3rd day of IV steroids  8/1- BGs 227-352- was pound cake in room- increased Levemir to 15 units BID 10. Normocytic anemia: Stable overall. Monitor for signs of infection.              Hemoglobin 10.1 on 7/24, 10.2 7/26 stable  Continue to monitor 11. Low calorie malnutrition: Continue supplement 12. Hyponatremia/ chronic Hypomagnesemia:   Sodium 132 on 7/24, 7/26  Mag normal 1.8 on 7/26 13. Abnormal LFTs: Resolved 14.  Drug-induced constipation: Dulcolax prn effective.   Improving  7/31- said 6+ days since LBM- will change dulcolax to daily and increase to 10 mg daily.   8/1- pt had large BM this AM  Increased bowel meds as necessary 15. Persistent daily HA: On topamax 100 mg/day.   Monitor with increased activity 16.  Thrombocytopenia  Platelets 146 on 7/24, 172 on 7/26  17. Insomnia: Melatonin 5mg  HS   LOS: 9 days A FACE TO FACE  EVALUATION WAS PERFORMED  Arya Luttrull 03/12/2020, 3:56 PM

## 2020-03-12 NOTE — Progress Notes (Signed)
RLE and right lateral ankle incisions with skin glue. Changed foam dressing to ankle. Right lateral heel red. Refused bilateral PRAFO boots, "they feel like cement on my feet."  Spoke with patient about importance of keeping heels elevated off bed. BLE's with mild edema. PRN zanaflex and tylenol given at 0636 for complaint leg pain. Monica [name] A

## 2020-03-13 ENCOUNTER — Inpatient Hospital Stay (HOSPITAL_COMMUNITY): Payer: Medicare Other | Admitting: Occupational Therapy

## 2020-03-13 ENCOUNTER — Telehealth: Payer: Self-pay | Admitting: Neurology

## 2020-03-13 ENCOUNTER — Inpatient Hospital Stay (HOSPITAL_COMMUNITY): Payer: Medicare Other

## 2020-03-13 DIAGNOSIS — G610 Guillain-Barre syndrome: Secondary | ICD-10-CM | POA: Diagnosis not present

## 2020-03-13 LAB — CBC
HCT: 30.7 % — ABNORMAL LOW (ref 36.0–46.0)
Hemoglobin: 9.7 g/dL — ABNORMAL LOW (ref 12.0–15.0)
MCH: 28.4 pg (ref 26.0–34.0)
MCHC: 31.6 g/dL (ref 30.0–36.0)
MCV: 89.8 fL (ref 80.0–100.0)
Platelets: 252 10*3/uL (ref 150–400)
RBC: 3.42 MIL/uL — ABNORMAL LOW (ref 3.87–5.11)
RDW: 13.2 % (ref 11.5–15.5)
WBC: 8.2 10*3/uL (ref 4.0–10.5)
nRBC: 0 % (ref 0.0–0.2)

## 2020-03-13 LAB — GLUCOSE, CAPILLARY
Glucose-Capillary: 186 mg/dL — ABNORMAL HIGH (ref 70–99)
Glucose-Capillary: 161 mg/dL — ABNORMAL HIGH (ref 70–99)
Glucose-Capillary: 353 mg/dL — ABNORMAL HIGH (ref 70–99)
Glucose-Capillary: 416 mg/dL — ABNORMAL HIGH (ref 70–99)
Glucose-Capillary: 233 mg/dL — ABNORMAL HIGH (ref 70–99)

## 2020-03-13 LAB — BASIC METABOLIC PANEL
Anion gap: 6 (ref 5–15)
BUN: 17 mg/dL (ref 8–23)
CO2: 25 mmol/L (ref 22–32)
Calcium: 8.4 mg/dL — ABNORMAL LOW (ref 8.9–10.3)
Chloride: 104 mmol/L (ref 98–111)
Creatinine, Ser: 0.81 mg/dL (ref 0.44–1.00)
GFR calc Af Amer: >60 mL/min (ref >60)
GFR calc non Af Amer: >60 mL/min (ref >60)
Glucose, Bld: 238 mg/dL — ABNORMAL HIGH (ref 70–99)
Potassium: 3.4 mmol/L — ABNORMAL LOW (ref 3.5–5.1)
Sodium: 135 mmol/L (ref 135–145)

## 2020-03-13 MED ORDER — PREGABALIN 25 MG PO CAPS
25.0000 mg | ORAL_CAPSULE | Freq: Every day | ORAL | Status: DC
  Administered 2020-03-13 – 2020-03-14 (×2): 25 mg via ORAL
  Filled 2020-03-13 (×2): qty 1

## 2020-03-13 MED ORDER — POTASSIUM CHLORIDE 20 MEQ PO PACK
40.0000 meq | PACK | Freq: Once | ORAL | Status: AC
  Administered 2020-03-13: 40 meq via ORAL
  Filled 2020-03-13: qty 2

## 2020-03-13 NOTE — Telephone Encounter (Signed)
Spoke with pt's husband. Still in pain, received IV steroid treatments x 5 days to help r/o MS. Husband pt he was told CIDP test came back negative. He states pt is not better, now she has L foot drop. He wants to know if the IVIG is necessary given the test results and he also states it didn't help when she had it in the hospital. Gabapentin did help some but then she started getting more pain and her left foot dropped. She has pain in her abdomen. She hasn't seen neurology in rehab. He said the pt had shingles in January and wondered if there's any nerve issues from the shingles she had. He is staying involved in her care daily at the hospital. He would like a call back if the physicians here have any information. He specifically asked about whether or not IVIG will be needed still as the pharmacy has called him to discuss at roughly $3000 copay.

## 2020-03-13 NOTE — Progress Notes (Signed)
Occupational Therapy Note  Patient Details  Name: Monica [name] MRN: [id number] Date of Birth: 1950/09/07  Today's Date: 03/13/2020 OT Missed Time: 60 Minutes Missed Time Reason: Pain  Pt received supine in bed with eyes closed and sunglasses on. Pt reports excruciating pain in Rt leg up to her side and across her midsection, feeling light a tight belt.  Pt reports increased pain with attempts to move.  RN arrived and applied heating pad to abdomen and along Rt side to attempt to decrease pain.  Pt politely declining therapy session at this time due to pain and requesting therapist attempt to return later.  RN aware of pt current status.   Rosalio Loud 03/13/2020, 10:59 AM

## 2020-03-13 NOTE — Progress Notes (Addendum)
[place] PHYSICAL MEDICINE & REHABILITATION PROGRESS NOTE  Subjective/Complaints: Has severe pain in right leg and band around midsection- similar to last week. Unable to tolerate OT today.  CBGs elevated secondary to steroids.  K+ low today- will supplement and repeat tomorrow.   ROS:  Pt denies SOB, abd pain, CP, N/V/C/D, and vision changes   Objective: Vital Signs: Blood pressure (!) 150/77, pulse 61, temperature 98.1 F (36.7 C), temperature source Oral, resp. rate 18, height 5\' 4"  (1.626 m), weight 60.7 kg, SpO2 99 %. No results found. Recent Labs    03/13/20 0626  WBC 8.2  HGB 9.7*  HCT 30.7*  PLT 252   Recent Labs    03/13/20 0626  NA 135  K 3.4*  CL 104  CO2 25  GLUCOSE 238*  BUN 17  CREATININE 0.81  CALCIUM 8.4*    Physical Exam: BP (!) 150/77 (BP Location: Left Arm)   Pulse 61   Temp 98.1 F (36.7 C) (Oral)   Resp 18   Ht 5\' 4"  (1.626 m)   Wt 60.7 kg   SpO2 99%   BMI 22.97 kg/m   General: Alert and oriented x 3, No apparent distress HEENT: Head is normocephalic, atraumatic, PERRLA, EOMI, sclera anicteric, oral mucosa pink and moist, dentition intact, ext ear canals clear,  Neck: Supple without JVD or lymphadenopathy Heart: Reg rate and rhythm. No murmurs rubs or gallops Chest: CTA bilaterally without wheezes, rales, or rhonchi; no distress Abdomen: Soft, non-tender, non-distended, bowel sounds positive. Extremities: No clubbing, cyanosis, or edema. Pulses are 2+ Skin: Right lateral thigh healing- almost healed; hands are not cold to touch- but feel so to pt. Psych: Normal mood.  Normal behavior. Musc: No edema in extremities.  No tenderness in extremities. Neuro: Alert Motor: Right lower extremity: Hip flexion, knee extension 0/5, ankle dorsiflexion 2-/5 Left lower extremity: Hip flexion, knee extension 4+/5, 0/5 ankle DF, 2- PF   Assessment/Plan: 1. 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  Physiatrist is providing close team supervision and 24 hour management of active medical problems listed below.  Physiatrist and rehab team continue to assess barriers to discharge/monitor patient progress toward functional and medical goals  Care Tool:  Bathing    Body parts bathed by patient: Right arm, Left arm, Chest, Abdomen, Face, Right upper leg, Left upper leg, Front perineal area, Buttocks, Left lower leg   Body parts bathed by helper: Right lower leg     Bathing assist Assist Level: Minimal Assistance - Patient > 75%     Upper Body Dressing/Undressing Upper body dressing   What is the patient wearing?: Pull over shirt    Upper body assist Assist Level: Supervision/Verbal cueing    Lower Body Dressing/Undressing Lower body dressing      What is the patient wearing?: Pants     Lower body assist Assist for lower body dressing: Maximal Assistance - Patient 25 - 49%     Toileting Toileting    Toileting assist Assist for toileting: 2 Helpers     Transfers Chair/bed transfer  Transfers assist  Chair/bed transfer activity did not occur: Refused (due to 10/10 pain and fatigue)  Chair/bed transfer assist level: Minimal Assistance - Patient > 75%     Locomotion Ambulation   Ambulation assist   Ambulation activity did not occur: Refused (due to 10/10 pain and fatigue)  Assist level: Minimal Assistance - Patient >  75% Assistive device: Walker-rolling Max distance: 20 ft   Walk 10 feet activity   Assist  Walk 10 feet activity did not occur: Refused (due to 10/10 pain and fatigue)  Assist level: Minimal Assistance - Patient > 75% Assistive device: Walker-rolling   Walk 50 feet activity   Assist Walk 50 feet with 2 turns activity did not occur: Refused  Assist level: Minimal Assistance - Patient > 75% Assistive device: Walker-rolling    Walk 150 feet  activity   Assist Walk 150 feet activity did not occur: Refused         Walk 10 feet on uneven surface  activity   Assist Walk 10 feet on uneven surfaces activity did not occur: Refused (due to 10/10 pain and fatigue)         Wheelchair     Assist Will patient use wheelchair at discharge?: Yes Type of Wheelchair: Manual Wheelchair activity did not occur: Refused (due to 10/10 pain and fatigue)  Wheelchair assist level: Set up assist Max wheelchair distance: 150 ft    Wheelchair 50 feet with 2 turns activity    Assist    Wheelchair 50 feet with 2 turns activity did not occur: Refused (due to 10/10 pain and fatigue)   Assist Level: Set up assist   Wheelchair 150 feet activity     Assist Wheelchair 150 feet activity did not occur: Refused (due to 10/10 pain and fatigue)   Assist Level: Set up assist     Medical Problem List and Plan: 1. 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  Continue CIR PT, OT, SLP   Husband very worried patient has MS, IV steroids x5 days to assess if this provides any improvement in weakness. If no response, MS is highly unlikely. Patient is unable to tolerate MRI brain without anesthesia, but may be able to tolerate outpatient MRI if open. Biopsy negative. Symptoms do not appear to be improving with IV steroids. Course completed 8/2   Refusing PRAFOs in spite of poor ankle DF B/L- could lose Range of ankles if not careful  2.  Antithrombotics: -DVT/anticoagulation:  Pharmaceutical: Continue Lovenox given limited ambulation.             -antiplatelet therapy: N/A 3. Chronic neck/back/Pain Management: Continue Methadone 20 mg tid, Pamelor 25 mg/hs and Nuycnta prn. Tizanidine tid prn for spasms.    7/29: Gabapentin has helped a lot. Makes her a little sleepy.   7/30: May benefit from repeat occipital nerve blocks in outpatient setting as these provided benefit  8 years ago. Although ablation was not helpful at that time, perhaps can be reattempted in the outpatient setting.  7/31- now having nerve pain in RUE per pt- just changed meds, so wait for change today   8/1- also having nerve pain in R side- on abdomen/side- will try lidocaine patches 8am to 8pm- 2 patches  8/2: Pain worse in RLE and in band across midsection today- unable to tolerate therapy. Add Lyrica 25mg .   Monitor with increased exertion 4.  Mood: LCSW to follow for evaluation ad support.              -antipsychotic agents: N/A 5. Neuropsych: This patient is capable of making decisions on her own behalf. 6. Skin/Wound Care: Routine pressure relief measures.  7. Fluids/Electrolytes/Nutrition: Monitor I/O.   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  9.T2DM with hyperglycemia-poorly controlled: A1c- 8.6.   Levemir 5 units BID, increased to 8 BID on 7/25  Novolog 5 units tic ac             CBG (last 3)  Recent Labs    03/12/20 2132 03/13/20 0735 03/13/20 1133  GLUCAP 318* 186* 161*   7/28: Increase Levemir to 12U BID.  7/29: Increase mealtime glucose in anticipation of hyperglycemia secondary to IV steroids.  7/30: CBG of 384 this morning. Increase Levemir to 13U BID.  7/31- BGs mid to high 200s- on 2-3rd day of IV steroids  8/1- BGs 227-352- was pound cake in room- increased Levemir to 15 units BID  8/2: continue current regimen. Today was last day of steroids.  10. Normocytic anemia: Stable overall. Monitor for signs of infection.              Hemoglobin 10.1 on 7/24, 10.2 7/26 stable  Continue to monitor 11. Low calorie malnutrition: Continue supplement 12. Hyponatremia/ chronic Hypomagnesemia:   Sodium 132 on 7/24, 7/26  Mag normal 1.8 on 7/26 13. Abnormal LFTs: Resolved 14.  Drug-induced constipation: Dulcolax prn effective.   Improving  7/31- said 6+ days since LBM-  will change dulcolax to daily and increase to 10 mg daily.   8/1- pt had large BM this AM  Increased bowel meds as necessary 15. Persistent daily HA: On topamax 100 mg/day.   Monitor with increased activity 16.  Thrombocytopenia  Platelets 146 on 7/24, 172 on 7/26  17. Insomnia: Melatonin 5mg  HS   LOS: 10 days A FACE TO FACE EVALUATION WAS PERFORMED  P Fate Galanti 03/13/2020, 12:06 PM

## 2020-03-13 NOTE — Telephone Encounter (Signed)
Pt's husband called wanting to speak to a RN regarding the pt's IVIG treatments. Please advise.

## 2020-03-13 NOTE — Progress Notes (Signed)
Occupational Therapy Session Note  Patient Details  Name: Monica [name] MRN: [id number] Date of Birth: April 16, 1951  Today's Date: 03/13/2020 OT Missed Time: 82 Minutes Missed Time Reason: Pain    Pt received supine with eyes closed, untouched breakfast. Pt reporting high pain in her R side/leg and stating she is unable to move. RN reported she had pain medication administered at 6:30am. Pt politely refusing 2/2 pain. OT left room and returned 60 min later to offer participation again. Pt still declining. Assisted in turning down all lights, as pt now wearing sunglasses and reporting sensitivity. Pt left supine with all needs met. 45 min skilled OT missed.    Therapy Documentation Precautions:  Precautions Precautions: Fall Precaution Comments: weak and unsteady RLE Restrictions Weight Bearing Restrictions: No   Therapy/Group: Individual Therapy  Curtis Sites 03/13/2020, 6:38 AM

## 2020-03-13 NOTE — Progress Notes (Signed)
Sleeps good during night. Every morning C/O severe pain to BLE's, right > left. Tearful this AM. Describes pain as squeezing and burning pain. Bilateral heels elevated off bed with pillow. Incision to right lateral ankle and RLE, OTA, healing without SxS of infection. Only PRN med given at [id number]-zanaflex & tylenol.Monica [name] A

## 2020-03-13 NOTE — Progress Notes (Signed)
Physical Therapy Session Note  Patient Details  Name: Monica [name] MRN: [id number] Date of Birth: 1951/01/08  Today's Date: 03/13/2020 PT Missed Time: 45 Minutes Missed Time Reason: Pain  Skilled Therapeutic Interventions/Progress Updates:   Received pt supine in bed eating lunch, pt stated "my body is flared up everywhere" and reported intense pain but did not state number. Pt reported MD told her she didn't have to do therapy today since she was in so much pain. Pt became tearful stating "I want them to figure out what's wrong with me because I want to do my therapy, I was getting better." Pt politely declined therapy due to high pain levels. Will attempt to make up as schedule allows. 45 minutes missed of skilled physical therapy due to pain.   Therapy Documentation Precautions:  Precautions Precautions: Fall Precaution Comments: weak and unsteady RLE Restrictions Weight Bearing Restrictions: No  Therapy/Group: Individual Therapy Martin Majestic PT, DPT   03/13/2020, 7:22 AM

## 2020-03-13 NOTE — Progress Notes (Signed)
Occupational Therapy Session Note  Patient Details  Name: Monica [name] MRN: [id number] Date of Birth: 09-22-1950  Today's Date: 03/13/2020 OT Individual Time: 1420-1440 OT Individual Time Calculation (min): 20 min  and Today's Date: 03/13/2020 OT Missed Time: 25 Minutes Missed Time Reason: Pain  Short Term Goals: Week 1:  OT Short Term Goal 1 (Week 1): Pt will complete toilet transfer with min assist. OT Short Term Goal 2 (Week 1): Pt will utilize AE to complete LB dressing with mod A OT Short Term Goal 3 (Week 1): Pt will engage in functional task in standing for 2 minutes to incresae endurance for ADLs.  Skilled Therapeutic Interventions/Progress Updates:    Pt greeted semi-reclined in bed with sunglasses on and spouse present. Pt tearful discussing pain with OT. Pt reported pain in B LEs, shooting up her legs and feeling of a tight band around her midsection. Pt reported the Doctor told her not to participate in therapies today since she was in so much pain. OT educated on importance of moving and trying to work through some of this pain, but patient did not feel as though she could try. Spouse with questions regarding pt progress within OT. OT provided update on how pt has been progressing up until this pain started today. Pt hopeful she can participate more tomorrow as she states she wants to get up and continue to do better. Pt left semi-reclined in bed with bed alarm on and needs met. Pt missed 25 minutes of OT treatment session.  Therapy Documentation Precautions:  Precautions Precautions: Fall Precaution Comments: weak and unsteady RLE Restrictions Weight Bearing Restrictions: No General: General OT Amount of Missed Time: 25 Minutes OT Missed Treatment Reason: Pain Pain: Pain Assessment Pain Scale: 0-10 Pain Score: 9  Pain Type: Acute pain Pain Location: Abdomen Pain Orientation: Right Pain Radiating Towards: right side Pain Descriptors / Indicators:  Aching;Discomfort Pain Frequency: Constant Pain Onset: On-going Patients Stated Pain Goal: 4 Pain Intervention(s): Rest, Repositioned   Therapy/Group: Individual Therapy  Valma Cava 03/13/2020, 2:47 PM

## 2020-03-14 ENCOUNTER — Telehealth: Payer: Self-pay | Admitting: Neurology

## 2020-03-14 ENCOUNTER — Inpatient Hospital Stay (HOSPITAL_COMMUNITY): Payer: Medicare Other | Admitting: Physical Therapy

## 2020-03-14 ENCOUNTER — Encounter (HOSPITAL_COMMUNITY): Payer: Medicare Other | Admitting: Psychology

## 2020-03-14 ENCOUNTER — Inpatient Hospital Stay (HOSPITAL_COMMUNITY): Payer: Medicare Other | Admitting: Occupational Therapy

## 2020-03-14 LAB — BASIC METABOLIC PANEL
Anion gap: 6 (ref 5–15)
BUN: 17 mg/dL (ref 8–23)
CO2: 26 mmol/L (ref 22–32)
Calcium: 8.5 mg/dL — ABNORMAL LOW (ref 8.9–10.3)
Chloride: 105 mmol/L (ref 98–111)
Creatinine, Ser: 0.79 mg/dL (ref 0.44–1.00)
GFR calc Af Amer: >60 mL/min (ref >60)
GFR calc non Af Amer: >60 mL/min (ref >60)
Glucose, Bld: 96 mg/dL (ref 70–99)
Potassium: 3.7 mmol/L (ref 3.5–5.1)
Sodium: 137 mmol/L (ref 135–145)

## 2020-03-14 LAB — GLUCOSE, CAPILLARY
Glucose-Capillary: 139 mg/dL — ABNORMAL HIGH (ref 70–99)
Glucose-Capillary: 97 mg/dL (ref 70–99)
Glucose-Capillary: 97 mg/dL (ref 70–99)
Glucose-Capillary: 125 mg/dL — ABNORMAL HIGH (ref 70–99)
Glucose-Capillary: 170 mg/dL — ABNORMAL HIGH (ref 70–99)
Glucose-Capillary: 130 mg/dL — ABNORMAL HIGH (ref 70–99)

## 2020-03-14 MED ORDER — GABAPENTIN 300 MG PO CAPS
300.0000 mg | ORAL_CAPSULE | Freq: Once | ORAL | Status: AC
  Administered 2020-03-14: 300 mg via ORAL
  Filled 2020-03-14: qty 1

## 2020-03-14 MED ORDER — INSULIN DETEMIR 100 UNIT/ML ~~LOC~~ SOLN
18.0000 [IU] | Freq: Two times a day (BID) | SUBCUTANEOUS | Status: DC
  Administered 2020-03-14 – 2020-03-16 (×4): 18 [IU] via SUBCUTANEOUS
  Filled 2020-03-14 (×5): qty 0.18

## 2020-03-14 MED ORDER — GABAPENTIN 800 MG PO TABS
400.0000 mg | ORAL_TABLET | Freq: Three times a day (TID) | ORAL | Status: DC
  Filled 2020-03-14: qty 0.5

## 2020-03-14 MED ORDER — GABAPENTIN 400 MG PO CAPS
400.0000 mg | ORAL_CAPSULE | Freq: Three times a day (TID) | ORAL | Status: DC
  Administered 2020-03-14 (×2): 400 mg via ORAL
  Filled 2020-03-14 (×3): qty 1

## 2020-03-14 NOTE — Telephone Encounter (Signed)
We have not received the orders. I notified Monica [name] and asked for them to be faxed again to [phone].

## 2020-03-14 NOTE — Progress Notes (Signed)
Occupational Therapy Weekly Progress Note  Patient Details  Name: Monica [name] MRN: [id number] Date of Birth: Jun 13, 1951  Beginning of progress report period: March 04, 2020 End of progress report period: March 14, 2020  Today's Date: 03/14/2020  Session 1 OT Individual Time: 5621-3086 OT Individual Time Calculation (min): 73 min   Session 2 OT Individual Time: 1315-1400 OT Individual Time Calculation (min): 45 min    Patient has met 3 of 3 short term goals.  Pt has been making steady progress towards OT goals. She has had days where she is in too much pain to participate, but seems to bounce back well within functional tasks Pt requires overall min A for functional BADL tasks with occasional mod A pending fatigue. Continue current POC.    Patient continues to demonstrate the following deficits: muscle weakness, decreased cardiorespiratoy endurance and decreased sitting balance, decreased standing balance, decreased postural control and decreased balance strategies and therefore will continue to benefit from skilled OT intervention to enhance overall performance with BADL and Reduce care partner burden.  Patient progressing toward long term goals..  Continue plan of care.  OT Short Term Goals Week 1:  OT Short Term Goal 1 (Week 1): Pt will complete toilet transfer with min assist. OT Short Term Goal 1 - Progress (Week 1): Met OT Short Term Goal 2 (Week 1): Pt will utilize AE to complete LB dressing with mod A OT Short Term Goal 2 - Progress (Week 1): Met OT Short Term Goal 3 (Week 1): Pt will engage in functional task in standing for 2 minutes to incresae endurance for ADLs. OT Short Term Goal 3 - Progress (Week 1): Met Week 2:  OT Short Term Goal 1 (Week 2): LTG=STG 2/2 ELOS  Skilled Therapeutic Interventions/Progress Updates:  Session 1   Pt greeted sitting in wc and agreeable to OT treatment session. Pt with much brighter affect today and eager to participate in self-care tasks.  Pt requesting to shower today. Pt able to achieve figure 4 position in wc to doff socks and shoes. She then completed stand-pivot into shower using grab bars to get to tub bench. Pt's R knee buckled upon pivot and required mod A to safely get to tub bench. Bathing completed seated on tub bench with min A for balance with leaning to wash buttocks. Stand-pivot out of shower with min A> Dressing completed from wc a the sink with min A> with min A sit<>stand and R knee block when removing unilateral UE to pull up pants. Worked on standing balance;/endurance with standing hair combing task. Pt then able to blow dry hair seated in wc w increased time. Pt left seated in wc with alarm belt on and finishing hair drying task.   Session 2 Pt greeted sitting in wc and agreeable to OT treatment session. Pt able to don socks using figure 4 position and set-up A. OT then had pt work on getting her shoes on. Pt needed mod A to don L shoe with assistance trying to use shoe horn. OT had pt try a different technique for donning L shoe- figure 4 position, then pt was able to place shoe and use loop on the heel of shoe to pull up. Pt brought down to therapy gym and completed stand-pivot wc to NuStep with RW and mod A to stand, then min A to pivot. Pt completed 10 mins on NuStep on level 3. Pt needed theraband wrapped on R LE to maintian position on step as well  as min cues to maintain hip/knee position in neutral. Seated toe taps on small cones with focus on hip extension-OT assist for R LE. Pt left seated in wc at end of session with alarm belt on, husband present, and nursing present.    Therapy Documentation Precautions:  Precautions Precautions: Fall Precaution Comments: weak and unsteady RLE Restrictions Weight Bearing Restrictions: No Pain:  Pt reports pain in BLEs at a 2/10. Manageable. Rest and repositioned for comfort.    Therapy/Group: Individual Therapy  Valma Cava 03/14/2020, 2:03 PM

## 2020-03-14 NOTE — Plan of Care (Signed)
  Problem: RH Toileting Goal: LTG Patient will perform toileting task (3/3 steps) with assistance level (OT) Description: LTG: Patient will perform toileting task (3/3 steps) with assistance level (OT)  Flowsheets (Taken 03/14/2020 1246) LTG: Pt will perform toileting task (3/3 steps) with assistance level: Minimal Assistance - Patient > 75% Note: Goal downgraded 8/3-ESD   Problem: RH Toilet Transfers Goal: LTG Patient will perform toilet transfers w/assist (OT) Description: LTG: Patient will perform toilet transfers with assist, with/without cues using equipment (OT) Flowsheets (Taken 03/14/2020 1246) LTG: Pt will perform toilet transfers with assistance level of: Minimal Assistance - Patient > 75% Note: Goal downgraded 8/3-ESD   Problem: RH Tub/Shower Transfers Goal: LTG Patient will perform tub/shower transfers w/assist (OT) Description: LTG: Patient will perform tub/shower transfers with assist, with/without cues using equipment (OT) Flowsheets (Taken 03/14/2020 1246) LTG: Pt will perform tub/shower stall transfers with assistance level of: Minimal Assistance - Patient > 75% Note: Goal downgraded 8/3-ESD

## 2020-03-14 NOTE — Progress Notes (Addendum)
[place] PHYSICAL MEDICINE & REHABILITATION PROGRESS NOTE  Subjective/Complaints: Ongoing neuropathic pain in the RLE. Sensitive to movement/touch at times. LLE to a lesser extent. Feels band like tightness/pain along chest wall.   ROS: Patient denies fever, rash, sore throat, blurred vision, nausea, vomiting, diarrhea, cough, shortness of breath or chest pain, headache, or mood change.   Objective: Vital Signs: Blood pressure (!) 149/74, pulse (!) 55, temperature 99.3 F (37.4 C), temperature source Oral, resp. rate 18, height 5\' 4"  (1.626 m), weight 60.7 kg, SpO2 97 %. No results found. Recent Labs    03/13/20 0626  WBC 8.2  HGB 9.7*  HCT 30.7*  PLT 252   Recent Labs    03/13/20 0626 03/14/20 0534  NA 135 137  K 3.4* 3.7  CL 104 105  CO2 25 26  GLUCOSE 238* 96  BUN 17 17  CREATININE 0.81 0.79  CALCIUM 8.4* 8.5*    Physical Exam: BP (!) 149/74 (BP Location: Left Arm)   Pulse (!) 55   Temp 99.3 F (37.4 C) (Oral)   Resp 18   Ht 5\' 4"  (1.626 m)   Wt 60.7 kg   SpO2 97%   BMI 22.97 kg/m   Constitutional: No distress . Vital signs reviewed. HEENT: EOMI, oral membranes moist Neck: supple Cardiovascular: RRR without murmur. No JVD    Respiratory/Chest: CTA Bilaterally without wheezes or rales. Normal effort    GI/Abdomen: BS +, non-tender, non-distended Ext: no clubbing, cyanosis, or edema Psych: pleasant and cooperative Skin: Right lateral thigh healing- almost healed; hands are not cold to touch- but feel so to pt. Psych: Normal mood.  Normal behavior. Musc: No edema in extremities.  No tenderness in extremities. Neuro: Alert Motor: Right lower extremity: Hip flexion, knee extension 0 to tr/5, ankle dorsiflexion 2-/5 Left lower extremity: Hip flexion, knee extension 4+/5, 0-tr/5 ankle DF, 2 PF. Decreased sensation in BLE, right more than left with sensory loss up to mid trunk.   Assessment/Plan: 1. 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  Physiatrist is providing close team supervision and 24 hour management of active medical problems listed below.  Physiatrist and rehab team continue to assess barriers to discharge/monitor patient progress toward functional and medical goals  Care Tool:  Bathing    Body parts bathed by patient: Right arm, Left arm, Chest, Abdomen, Face, Right upper leg, Left upper leg, Front perineal area, Buttocks, Left lower leg   Body parts bathed by helper: Right lower leg     Bathing assist Assist Level: Minimal Assistance - Patient > 75%     Upper Body Dressing/Undressing Upper body dressing   What is the patient wearing?: Pull over shirt    Upper body assist Assist Level: Supervision/Verbal cueing    Lower Body Dressing/Undressing Lower body dressing      What is the patient wearing?: Pants     Lower body assist Assist for lower body dressing: Maximal Assistance - Patient 25 - 49%     Toileting Toileting    Toileting assist Assist for toileting: 2 Helpers     Transfers Chair/bed transfer  Transfers assist  Chair/bed transfer activity did not occur: Refused (due to 10/10 pain and fatigue)  Chair/bed transfer assist level: Minimal Assistance - Patient > 75%     Locomotion Ambulation   Ambulation assist   Ambulation activity did not occur: Refused (due to 10/10 pain and fatigue)  Assist level: Minimal Assistance - Patient > 75% Assistive device: Walker-rolling Max distance: 20 ft   Walk 10 feet activity   Assist  Walk 10 feet activity did not occur: Refused (due to 10/10 pain and fatigue)  Assist level: Minimal Assistance - Patient > 75% Assistive device: Walker-rolling   Walk 50 feet activity   Assist Walk 50 feet with 2 turns activity did not occur: Refused  Assist level: Minimal Assistance - Patient > 75% Assistive device:  Walker-rolling    Walk 150 feet activity   Assist Walk 150 feet activity did not occur: Refused         Walk 10 feet on uneven surface  activity   Assist Walk 10 feet on uneven surfaces activity did not occur: Refused (due to 10/10 pain and fatigue)         Wheelchair     Assist Will patient use wheelchair at discharge?: Yes Type of Wheelchair: Manual Wheelchair activity did not occur: Refused (due to 10/10 pain and fatigue)  Wheelchair assist level: Set up assist Max wheelchair distance: 150 ft    Wheelchair 50 feet with 2 turns activity    Assist    Wheelchair 50 feet with 2 turns activity did not occur: Refused (due to 10/10 pain and fatigue)   Assist Level: Set up assist   Wheelchair 150 feet activity     Assist Wheelchair 150 feet activity did not occur: Refused (due to 10/10 pain and fatigue)   Assist Level: Set up assist     Medical Problem List and Plan: 1. 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  Continue CIR PT, OT, SLP   -discussed presentation with pt/husband today.  Has not had a lot of improvement with steroids or IVIG. Judging by note from neurology, husband likely reached out to neuro. Will certainly need to have them involved in the treatment decision making, especially since IVIG was denied by insurance  Pt willing to use AFO's but doesn't want PRAFO's. Aware of contractures that can result 2.  Antithrombotics: -DVT/anticoagulation:  Pharmaceutical: Continue Lovenox given limited ambulation.             -antiplatelet therapy: N/A 3. Chronic neck/back/Pain Management: Continue Methadone 20 mg tid, Pamelor 25 mg/hs and Nuycnta prn. Tizanidine tid prn for spasms.    8/3: increase gabapentin to 400mg  tid, give extra dose this morning   -dc lyrica   -outpt occipital nerve/facet blocks potentially    4.  Mood: LCSW to follow for evaluation ad support.               -antipsychotic agents: N/A 5. Neuropsych: This patient is capable of making decisions on her own behalf. 6. Skin/Wound Care: Routine pressure relief measures.  7. Fluids/Electrolytes/Nutrition: Monitor I/O.   8/3: Hypokalemia- supplemented with K+ and repeat level 3.7   -daily dosing.  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  9.T2DM with hyperglycemia-poorly controlled: A1c- 8.6.   Levemir 5 units BID, increased to 8 BID on 7/25  Novolog 5 units tic ac             CBG (last 3)  Recent Labs    03/13/20 1714 03/13/20 2146 03/14/20 0625  GLUCAP 353* 233* 97   8/3 cbg's still poorly controlled. Increase levemir to 18u bid with 8u novolog with meals.  10. Normocytic anemia: Stable overall. Monitor for signs of infection.  Hemoglobin 10.1 on 7/24, 10.2 7/26 stable  Continue to monitor 11. Low calorie malnutrition: Continue supplement 12. Hyponatremia/ chronic Hypomagnesemia:   Sodium 137 on 8/3  Mag normal 1.8 on 7/26 13. Abnormal LFTs: Resolved 14.  Drug-induced constipation: Dulcolax prn effective.   Improving  7/31- said 6+ days since LBM- will change dulcolax to daily and increase to 10 mg daily.   8/1- pt had large BM    Increase bowel meds as necessary 15. Persistent daily HA: On topamax 100 mg/day.   Monitor with increased activity 16.  Thrombocytopenia  Platelets 146 on 7/24, 172 on 7/26  17. Insomnia: Melatonin 5mg  HS   LOS: 11 days A FACE TO FACE EVALUATION WAS PERFORMED  03/14/2020, 9:50 AM

## 2020-03-14 NOTE — Telephone Encounter (Signed)
Patient is likely not ready for d/c from Rehab- please ask neurology to consult in rehab. This is a neuromuscular disorder ad I am not versed in that part of neurology. CD

## 2020-03-14 NOTE — Plan of Care (Signed)
D/c goal - pt reports she sleeps in recliner & has done so for years.  Problem: RH Bed Mobility Goal: LTG Patient will perform bed mobility with assist (PT) Description: LTG: Patient will perform bed mobility with assistance, with/without cues (PT). 03/14/2020 1519 by Sandi Mariscal, PT Outcome: Adequate for Discharge Flowsheets (Taken 03/14/2020 1519) LTG: Pt will perform bed mobility with assistance level of: (d/c goal - pt reports she sleeps in recliner) -- Note: d/c goal - pt reports she sleeps in recliner 03/14/2020 1233 by Sandi Mariscal, PT Flowsheets (Taken 03/14/2020 1233) LTG: Pt will perform bed mobility with assistance level of: (downgrade 2/2 slow progress) Minimal Assistance - Patient > 75% Note: downgrade 2/2 slow progress

## 2020-03-14 NOTE — Progress Notes (Signed)
Physical Therapy Session Note  Patient Details  Name: Monica [name] MRN: [id number] Date of Birth: [birth year]  Today's Date: 03/14/2020 PT Individual Time: 4081-4481 and 8563-1497 PT Individual Time Calculation (min): 53 min and 26 min  Short Term Goals: Week 2:  PT Short Term Goal 1 (Week 2): Pt will complete bed<>chair transfers with LRAD & CGA. PT Short Term Goal 2 (Week 2): Pt will negotiate 4 steps with 1 rail per home set up with mod assist. PT Short Term Goal 3 (Week 2): Pt will complete car transfer with LRAD & mod assist.  Skilled Therapeutic Interventions/Progress Updates:  Treatment 1: Pt received in w/c & agreeable to tx. Pt reports 6-7/10 pain in RUE & BLE but is premedicated & PT distracted pt from pain during session. Discussed d/c plans with pt reporting her husband has purchased a ramp so she will not have to negotiate stairs to enter her home. Discussed showering with pt reporting full shower is upstairs but pt notes she has fallen each time she has negotiated stairs prior to admission & PT educated her on recommendation of sponge baths downstairs & to not attempt stair negotiation to access upstairs at this time - pt verbalizes understanding. Pt requests to attempt to don tennis shoes & does so at w/c level with use of shoe horn with min assist. Transported pt to gym via w/c dependent assist. Reviewed stair negotiation & PT demonstrates/instructs pt on lateral stair negotiation with BUE on 1 rail with pt able to place LLE onto step but unable to step up with R without max assist so pt returns to sitting in w/c. Then discussed car transfer with pt reporting she has a truck Agricultural engineer she can ride home in but notes she uses the running board to get into truck & always falls when getting out of truck even with husband assisting. PT educated pt on need to discuss stairs & car further with her husband and will need to practice real car transfer prior to d/c. Pt transfers sit>stand  with heavy min assist and ambulates 15 ft with RW & BLE foot drop with decreased gait speed & heavy reliance on RW. At end of session pt left in w/c with chair alarm donned, call bell & all needs in reach.  Treatment 2: Pt received in handoff from neuropsychologist & PT transported pt back to room via w/c dependent assist where pt's husband Monica [name] was present. Session spent educating pt & husband. Discussed recommendation that pt not negotiate steps at this time with Monica [name] reporting he has ordered a ramp & plans to use it to get pt in middle level of house. He also states he can wheel her outside around to entrance on lower level where it is a level entry & where they have a walk in shower so she can shower as needed vs sponge bathe. Discussed car transfer with Monica [name] reporting they do have a truck & a [employer] - PT asked him to measure height of cars to allow [place] to practice this & also scheduled real car transfer practice with him & wife on Friday. Educated them on AFO consult scheduled for tomorrow & purpose of PRAFO's as well as how to don them & pt agreeable to wearing them at night. Pt left in w/c with chair alarm donned, call bell in reach, & Monica [name] present in room.  Discussed use of AFO's with MD & he is in agreement of pt receiving AFO's to assist with gait.  Planning for AFO consult tomorrow.  Therapy Documentation Precautions:  Precautions Precautions: Fall Precaution Comments: weak and unsteady RLE Restrictions Weight Bearing Restrictions: No    Therapy/Group: Individual Therapy  Sandi Mariscal 03/14/2020, 4:12 PM

## 2020-03-14 NOTE — Telephone Encounter (Signed)
Spoke with Dr. Vickey Huger. Dr. Hermelinda Medicus had contacted her today. She prefers this be reviewed by Dr. Anne Hahn. Spoke with Dr. Anne Hahn and he will review case.

## 2020-03-14 NOTE — Telephone Encounter (Signed)
Monica [name] from [employer] called stating that the orders were faxed yesterday to RN's pod to get the [employer] can get approved. If there are any questions please call [phone]

## 2020-03-14 NOTE — Plan of Care (Signed)
Goals downgraded 2/2 slow progress  Problem: RH Balance Goal: LTG Patient will maintain dynamic sitting balance (PT) Description: LTG:  Patient will maintain dynamic sitting balance with assistance during mobility activities (PT) Flowsheets (Taken 03/14/2020 1233) LTG: Pt will maintain dynamic sitting balance during mobility activities with:: (downgrade 2/2 slow progress) Supervision/Verbal cueing Goal: LTG Patient will maintain dynamic standing balance (PT) Description: LTG:  Patient will maintain dynamic standing balance with assistance during mobility activities (PT) Flowsheets (Taken 03/14/2020 1233) LTG: Pt will maintain dynamic standing balance during mobility activities with:: (downgrade 2/2 slow progress) Minimal Assistance - Patient > 75% Note: downgrade 2/2 slow progress   Problem: Sit to Stand Goal: LTG:  Patient will perform sit to stand with assistance level (PT) Description: LTG:  Patient will perform sit to stand with assistance level (PT) Flowsheets (Taken 03/14/2020 1233) LTG: PT will perform sit to stand in preparation for functional mobility with assistance level: (downgrade 2/2 slow progress) Minimal Assistance - Patient > 75% Note: downgrade 2/2 slow progress   Problem: RH Bed Mobility Goal: LTG Patient will perform bed mobility with assist (PT) Description: LTG: Patient will perform bed mobility with assistance, with/without cues (PT). Flowsheets (Taken 03/14/2020 1233) LTG: Pt will perform bed mobility with assistance level of: (downgrade 2/2 slow progress) Minimal Assistance - Patient > 75% Note: downgrade 2/2 slow progress   Problem: RH Bed to Chair Transfers Goal: LTG Patient will perform bed/chair transfers w/assist (PT) Description: LTG: Patient will perform bed to chair transfers with assistance (PT). Flowsheets (Taken 03/14/2020 1233) LTG: Pt will perform Bed to Chair Transfers with assistance level: (downgrade 2/2 slow progress) Minimal Assistance - Patient >  75% Note: downgrade 2/2 slow progress   Problem: RH Ambulation Goal: LTG Patient will ambulate in controlled environment (PT) Description: LTG: Patient will ambulate in a controlled environment, # of feet with assistance (PT). Flowsheets (Taken 03/14/2020 1233) LTG: Pt will ambulate in controlled environ  assist needed:: (downgrade 2/2 slow progress) Minimal Assistance - Patient > 75% LTG: Ambulation distance in controlled environment: 50 ft with LRAD Note: downgrade 2/2 slow progress Goal: LTG Patient will ambulate in home environment (PT) Description: LTG: Patient will ambulate in home environment, # of feet with assistance (PT). Flowsheets (Taken 03/14/2020 1233) LTG: Pt will ambulate in home environ  assist needed:: (downgrade 2/2 slow progress) Minimal Assistance - Patient > 75% LTG: Ambulation distance in home environment: 25 ft with LRAD Note: downgrade 2/2 slow progress   Problem: RH Stairs Goal: LTG Patient will ambulate up and down stairs w/assist (PT) Description: LTG: Patient will ambulate up and down # of stairs with assistance (PT) Flowsheets (Taken 03/14/2020 1233) LTG: Pt will ambulate up/down stairs assist needed:: (downgrade 2/2 slow progress) Moderate Assistance - Patient 50 - 74% LTG: Pt will  ambulate up and down number of stairs: 4-5 steps with 1 rail Note: downgrade 2/2 slow progress

## 2020-03-14 NOTE — Progress Notes (Signed)
Patient ID: Monica [name], female   DOB: [birth year], 69 y.o.   MRN: [id number]  SW met with pt and pt husband in room to provide updates from team conference, and d/c date 8/11. Family education Friday 8/6 1pm-3pm and Monday 8/9 1pm-3pm. Preferred DME company is [employer].   Loralee Pacas, MSW, [place] Office: [phone] Cell: [phone] Fax: [phone]

## 2020-03-14 NOTE — Telephone Encounter (Signed)
I called and talk with Dr. Caren Hazy from rehab.  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  There is no indication for IVIG or steroids in this case.  The patient will require rehab and effective pain management.

## 2020-03-14 NOTE — Patient Care Conference (Signed)
Inpatient RehabilitationTeam Conference and Plan of Care Update Date: 03/14/2020   Time: 10:35 AM    Patient Name: Monica [name]      Medical Record Number: [id number]  Date of Birth: [birth year] Sex: Female         Room/Bed: 4W25C/4W25C-01 Payor Info: Payor: MEDICARE / Plan: MEDICARE PART A AND B / Product Type: *No Product type* /    Admit Date/Time:  03/03/2020  2:23 PM  Primary Diagnosis:  Polyradiculoneuropathy [facility])  Hospital Problems: Principal Problem:   Polyradiculoneuropathy (HCC) Active Problems:   Thrombocytopenia (HCC)   Persistent headaches   Drug induced constipation   Uncontrolled type 2 diabetes mellitus with hyperglycemia [facility])    Expected Discharge Date: Expected Discharge Date: 03/22/20  Team Members Present: Physician leading conference: Dr. Faith Rogue Care Coodinator Present: Cecile Sheerer, LCSWA;Ishia Tenorio Marlyne Beards, RN, BSN, CRRN Nurse Present: Otilio Carpen, RN PT Present: Aleda Grana, PT OT Present: Kearney Hard, OT SLP Present: Feliberto Gottron, SLP PPS Coordinator present : Edson Snowball, Park Breed, SLP     Current Status/Progress Goal Weekly Team Focus  Bowel/Bladder   cont of b and b   remain contient  assess q shift prn   Swallow/Nutrition/ Hydration             ADL's   min/mod A overall when pain is controlled  Supervision/Min A  pain management, transfers, sit<>stand general strengthening, self-care retraining, activity tolerance   Mobility   supervision<>mod assist bed mobility, min/mod assist sit<>stand, mod assist<>unsafe to attempt stairs  supervision w/c mobility & transfers, CGA gait, min assist car & stairs -- plan to downgrade  activity tolerance, pt education, transfers, gait, stairs as able, w/c mobility, strengthening, balance, d/c planning   Communication             Safety/Cognition/ Behavioral Observations            Pain   c/o 8/10 pain   pain less then 5/10 scale  manage pain q shift and prn    Skin    2 incisions  no furthur skjin break down   assess skin q shift and prn      Team Discussion:  Discharge Planning/Teaching Needs:  Going home with spouse.  Continue family education as recommended by therapy.   Current Update: Continues with anxiety, pain, and refusing therapy when pain level is high.  Current Barriers to Discharge:  Pain, diabetes, and emotions.  Possible Resolutions to Barriers: Continue with pain medications, adjustments being made to diabetes medications, and provide emotional support to patient when having tearful or down episodes.  Patient on target to meet rehab goals: yes, has good support from her husband, k-pad is helping with some of her discomfort, PT is downgrading some of her goals. However, she is progressing day to day.  *See Care Plan and progress notes for long and short-term goals.   Revisions to Treatment Plan:  none    Medical Summary Current Status: polyradiculopathy of unclear origin. pain control issues. moving bowels and bladder. completing steroids. Weekly Focus/Goal: pain mgt, further neuro work up/treatment  Barriers to Discharge: Medical stability   Possible Resolutions to Barriers: pain mgt, further discussion with neuro re: ongoing mgt considerations   Continued Need for Acute Rehabilitation Level of Care: The patient requires daily medical management by a physician with specialized training in physical medicine and rehabilitation for the following reasons: Direction of a multidisciplinary physical rehabilitation program to maximize functional independence : Yes Medical management of patient stability for  increased activity during participation in an intensive rehabilitation regime.: Yes Analysis of laboratory values and/or radiology reports with any subsequent need for medication adjustment and/or medical intervention. : Yes   I attest that I was present, lead the team conference, and concur with the assessment and plan of the  team.   Tennis Must 03/14/2020, 3:46 PM

## 2020-03-14 NOTE — Progress Notes (Signed)
Orthopedic Tech Progress Note Patient Details:  Monica [name] 11-20-1950 [id number] Called in brace. Patient ID: Monica [name], female   DOB: [birth year], 69 y.o.   MRN: [id number]   Michelle Piper 03/14/2020, 6:46 PM

## 2020-03-14 NOTE — Consult Note (Signed)
Neuropsychological Consultation   Patient:   Monica [name]   DOB:   [birth year]  MR Number:  [id number]  Location:  [facility] [address] CENTER A [address] [id number] [place] [address] Dept: [phone] Loc: [phone]           Date of Service:   03/14/2020  Start Time:   2 PM End Time:   3 PM  Provider/Observer:  Arley Phenix, Psy.D.       Clinical Neuropsychologist       Billing Code/Service: [phone]  Chief Complaint:    Monica [name] is a 69 year old female with history of uncontrolled type 2 diabetes with polyneuropathy, cervicalgia, chronic headaches, chronic pain on methadone/Nucynta, constipation, gait disorder with multiple falls.  The patient has had progressive weakness with inability to walk for the last 4 weeks prior to admission.  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  Patient was started on steroids and IVIG for concerns of possible MS without improvement.  Patient also has had muscle biopsies conducted with no significant findings.  Patient continues to be limited by weakness with impairments in mobility and ADLs.  The patient is also having significant depressive symptoms with crying spells.  I have seen the patient for a few visits over the past 10 years.  I first saw the patient 2012 and again saw her in 2018.  The patient had been dealing with severe headaches that were migrainous in nature as well as cervical facet syndrome.  The patient also has been dealing with a lot of stress to the years and symptoms of depression anxiety and family stress.  There is been a long history of  excessive worrying as well as history of sleep deprivation and insomnia.  Reason for Service:  Patient was referred for  neuropsychological consultation due to coping and adjustment issues and anxiety around fears and worries associated what is causing her severe weakness and impairments in mobility.  Below is the HPI for the current admission.  HPI: Monica [name] is a 69 y.o female with history of uncontrolled T2D with polyneuropathy, cervicalgia, chronic headaches, chronic pain--on methadone/Nucynta per Dr. Hermelinda Medicus, constipation, gait disorder with multiple falls and has had progressive weakness with inability to walk in the last 4 weeks.  History taken from chart review and patient.  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  She was started on steroids while awaiting approval for IVIG, however due to 2 issues with progressive weakness as well as severe pain and neuropathy RLE greater than LLE as well as AKI she was admitted for management.  She was started on IV fluids and empirically treated with IVIG with with improvement in weakness and sensation.  Neurology following for work-up and recommended muscle biopsy.  She underwent right sural nerve and superficial quadriceps muscle biopsy by Dr. Yetta Barre on 07/19--pathology still pending.  Dysesthesias RLE had greatly improved however she continues to be limited by weakness with impairments in mobility and ADLs.  She is showing improvement in activity tolerance and CIR was recommended for follow-up therapy.  Please see preadmission assessment from earlier today as well.  Current Status:  The patient acknowledges significant stress and worry about what is  causing her significant mobility issues.  She reports significant desire to figure out what is causing these changes even if those issues are identified to be of significant medical importance.  The patient reports that the unknowing  part of what is causing her symptoms is one of the most stressful aspects to her situation.  She acknowledges significant depression with crying spells and her emotional response to her difficulties create coping and adjustment issues between the patient and her husband.  He has difficulty knowing how to respond to her ongoing problems that have not been attributed to any particular etiological factor and he does not really know how to respond to her distress.  Behavioral Observation: Monica [name]  presents as a 69 y.o.-year-old Right Caucasian Female who appeared her stated age. her dress was Appropriate and she was Well Groomed and her manners were Appropriate to the situation.  her participation was indicative of Appropriate and Redirectable behaviors.  There were any physical disabilities noted.  she displayed an appropriate level of cooperation and motivation.     Interactions:    Active Appropriate  Attention:   abnormal and patient tends to be distracted by internal preoccupations and anxiety.  Memory:   within normal limits; recent and remote memory intact  Visuo-spatial:  not examined  Speech (Volume):  normal  Speech:   normal; normal  Thought Process:  Coherent and Relevant  Though Content:  Rumination; not suicidal and not homicidal  Orientation:   person, place, time/date and situation  Judgment:   Good  Planning:   Good  Affect:    Anxious and Depressed  Mood:    Dysphoric  Insight:   Good  Intelligence:   normal  Medical History:   Past Medical History:  Diagnosis Date  . Arthritis   . Cervical facet joint syndrome 04/22/2012  . Cervicalgia   . Chronic headaches    Dr Hermelinda Medicus [place] [facility]  . Chronic nausea    normal [facility]   . Complication of anesthesia    had problem with local anesthesia-tried to assist Physician  . DM (diabetes mellitus) (HCC)   . Elevated liver enzymes    hx of, normal 03/2010  . GERD (gastroesophageal reflux disease)  05/31/2011   History Schatzki's ring, erosive reflux esophagitis, status post EGD and dilation 8/11 and 11/20  . Glaucoma    lens in both eyes  . Hemorrhoids 11/17/08   Colonoscopy Dr Karilyn Cota  . Occipital neuralgia   . PTSD (post-traumatic stress disorder)   . Renal lithiasis   . 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 Torticollis   . Unspecified musculoskeletal disorders and symptoms referable to neck    cervical/trapezius        Abuse/Trauma History: Patient has a significant history of chronic PTSD from events that happened years ago.  The patient also has a history of anxiety and depressive symptoms along with chronic pain and insomnia.  Family Med/Psych History:  Family History  Problem Relation Age of Onset  . Colon cancer Mother 64  . Heart disease Father   . Diabetes Father   . Diabetes Paternal Grandmother   . Heart disease Paternal Grandfather   . Diverticulitis Daughter     Risk of Suicide/Violence: low patient denies any suicidal homicidal ideation.  Impression/DX:  Monica [name] is a 69 year old female with history of uncontrolled type 2 diabetes with polyneuropathy, cervicalgia, chronic headaches, chronic pain on methadone/Nucynta, constipation, gait disorder with multiple falls.  The patient has had progressive weakness with inability to walk for the last 4 weeks prior to admission.  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  Patient was started on steroids and IVIG for concerns of possible MS without improvement.  Patient also has had muscle biopsies conducted with no significant findings.  Patient continues to be limited by weakness with impairments in mobility and ADLs.  The patient is also having significant depressive symptoms with crying spells.  I have seen the patient  for a few visits over the past 10 years.  I first saw the patient 2012 and again saw her in 2018.  The patient had been dealing with severe headaches that were migrainous in nature as well as cervical facet syndrome.  The patient also has been dealing with a lot of stress to the years and symptoms of depression anxiety and family stress.  There is been a long history of  excessive worrying as well as history of sleep deprivation and insomnia.  The patient acknowledges significant stress and worry about what is causing her significant mobility issues.  She reports significant desire to figure out what is causing these changes even if those issues are identified to be of significant medical importance.  The patient reports that the unknowing part of what is causing her symptoms is one of the most stressful aspects to her situation.  She acknowledges significant depression with crying spells and her emotional response to her difficulties create coping and adjustment issues between the patient and her husband.  He has difficulty knowing how to respond to her ongoing problems that have not been attributed to any particular etiological factor and he does not really know how to respond to her distress.  Disposition/Plan:  I will follow up with the patient later this week or first of next week and we will also set up for follow-up appointments on an outpatient basis when she is completed her care here.        Electronically Signed   _______________________ Arley Phenix, Psy.D.

## 2020-03-15 ENCOUNTER — Inpatient Hospital Stay (HOSPITAL_COMMUNITY): Payer: Medicare Other | Admitting: Physical Therapy

## 2020-03-15 ENCOUNTER — Inpatient Hospital Stay (HOSPITAL_COMMUNITY): Payer: Medicare Other

## 2020-03-15 DIAGNOSIS — E1144 Type 2 diabetes mellitus with diabetic amyotrophy: Secondary | ICD-10-CM

## 2020-03-15 LAB — GLUCOSE, CAPILLARY
Glucose-Capillary: 98 mg/dL (ref 70–99)
Glucose-Capillary: 154 mg/dL — ABNORMAL HIGH (ref 70–99)
Glucose-Capillary: 218 mg/dL — ABNORMAL HIGH (ref 70–99)
Glucose-Capillary: 94 mg/dL (ref 70–99)

## 2020-03-15 MED ORDER — TAPENTADOL HCL 50 MG PO TABS
50.0000 mg | ORAL_TABLET | Freq: Four times a day (QID) | ORAL | Status: DC | PRN
  Administered 2020-03-17 – 2020-03-24 (×4): 50 mg via ORAL
  Filled 2020-03-15 (×6): qty 1

## 2020-03-15 MED ORDER — SODIUM CHLORIDE 0.9% FLUSH
3.0000 mL | Freq: Two times a day (BID) | INTRAVENOUS | Status: DC
  Administered 2020-03-15: 3 mL via INTRAVENOUS

## 2020-03-15 MED ORDER — GABAPENTIN 600 MG PO TABS
600.0000 mg | ORAL_TABLET | Freq: Three times a day (TID) | ORAL | Status: DC
  Administered 2020-03-15 – 2020-03-18 (×10): 600 mg via ORAL
  Filled 2020-03-15 (×10): qty 1

## 2020-03-15 NOTE — Telephone Encounter (Signed)
I spoke with Monica [name] at [facility] this morning and gave v.o. to stop the IVIG process as it is no longer indicated per Dr. Anne Hahn. She will notify the pharmacy team and they will send over d/c orders to sign.

## 2020-03-15 NOTE — Progress Notes (Signed)
Occupational Therapy Session Note  Patient Details  Name: Monica [name] MRN: [id number] Date of Birth: 05/20/1951  Today's Date: 03/15/2020 OT Individual Time: 1140-1210 OT Individual Time Calculation (min): 30 min    Short Term Goals: Week 2:  OT Short Term Goal 1 (Week 2): LTG=STG 2/2 ELOS  Skilled Therapeutic Interventions/Progress Updates:    Session focused on BUE strengthening. Pt received in w/c with c/o spasms in her abdomen radiating down to her legs, un-rated. Pt held 3 lb weighted dowel and completed UE circuit 2x 10 shoulder press, chest press, and bicep curls to increase UE strength needed for ADLs and transfers. Pt reporting that exercises alleviated the spasms in her legs and was very pleased. Discussed HEP and wrote down dowel information per pt request. Pt was left sitting up with all needs met, chair alarm belt fastened.   Therapy Documentation Precautions:  Precautions Precautions: Fall Precaution Comments: weak and unsteady RLE Restrictions Weight Bearing Restrictions: No   Therapy/Group: Individual Therapy  Curtis Sites 03/15/2020, 7:30 AM

## 2020-03-15 NOTE — Plan of Care (Signed)
D/c stairs goal - not safe at this time & pt's husband has purchased a ramp to use to access home entrance  Problem: RH Stairs Goal: LTG Patient will ambulate up and down stairs w/assist (PT) Description: LTG: Patient will ambulate up and down # of stairs with assistance (PT) Outcome: Adequate for Discharge Flowsheets (Taken 03/15/2020 0759) LTG: Pt will ambulate up/down stairs assist needed:: (d/c goal - not safe at this time & pt's husband has purchased a ramp to access home entrance) -- Note: d/c goal - not safe at this time & pt's husband has purchased a ramp to access home entrance

## 2020-03-15 NOTE — Telephone Encounter (Signed)
I have received a verbal order from Dr. Anne Hahn that IVIG is to be canceled. I will be in contact with Monica [name] at [facility] today.

## 2020-03-15 NOTE — Plan of Care (Signed)
Pt states pain is constant and intense. [name] adjusted medication today. Pt states it does feel slightly better after medication given. Will continue to give PRN mediations. Goal remains to have patient's pain at <=4/10

## 2020-03-15 NOTE — Progress Notes (Signed)
Physical Therapy Session Note  Patient Details  Name: Monica [name] MRN: [id number] Date of Birth: 1950-10-22  Today's Date: 03/15/2020 PT Individual Time: 1251-1406 and 3419-6222 PT Individual Time Calculation (min): 75 min And 28 min  Short Term Goals: Week 2:  PT Short Term Goal 1 (Week 2): Pt will complete bed<>chair transfers with LRAD & CGA. PT Short Term Goal 2 (Week 2): Pt will negotiate 4 steps with 1 rail per home set up with mod assist. PT Short Term Goal 3 (Week 2): Pt will complete car transfer with LRAD & mod assist.  Skilled Therapeutic Interventions/Progress Updates:  Treatment 1: Pt received in w/c & agreeable to tx. Pt c/o pain to palpation in R heel during AFO consult & reports pain relief when not touched. PT assisted with switching to tennis shoes with removable tongue total assist. In gym, AFO consult completed with Thayer Ohm Science writer). Pt transfers sit<>stand with min assist with RW with intermittent R knee collapsing medially during transfer. Pt ambulates 40 ft without AFO's with pt demonstrating excessive hip flexion to compensate for decreased knee flexion & foot drop BLE. Donned BLE PLS AFO's and pt ambulates additional 40 ft with min assist with decreased hip flexion and improved foot clearance BLE. After discussion with pt & Thayer Ohm, recommendations made to also apply toe cap to B shoes for increased safety with mobility. At high/low table pt stood x 9 minutes with 1UE support & min assist whlie engaging in puzzle assembly with task focusing on standing tolerance & BLE strengthening. At end of session pt left in w/c with chair alarm donned, all needs in reach & husband present in room.   Treatment 2: Pt received in bed with husband present for session. Pt c/o fatigue & generalized pain in BLE but does not rate & uses k-pad at end of session PRN. [name] present for session & pt completes car transfer with min assist with car set at 26" high to simulate [place]  they have [name] measured height of seat). Pt ambulates to car, then stand pivots car>w/c with min assist and pt is able to place LLE into car & uses UE to assist RLE into car. Plans to practice real car transfer on Friday with pt & husband. Back in room, pt ambulates ~3 ft w/c>bed with RW & min assist, sit>supine with mod assist to elevate bLE onto bed. Pt left in bed with all needs in reach, bed alarm set, [name] in room.  Therapy Documentation Precautions:  Precautions Precautions: Fall Precaution Comments: weak and unsteady RLE Restrictions Weight Bearing Restrictions: No    Therapy/Group: Individual Therapy  Sandi Mariscal 03/15/2020, 4:06 PM

## 2020-03-15 NOTE — Progress Notes (Signed)
Occupational Therapy Session Note  Patient Details  Name: Monica [name] MRN: [id number] Date of Birth: 04-05-1951  Today's Date: 03/15/2020 OT Individual Time: 5638-7564 OT Individual Time Calculation (min): 70 min    Short Term Goals: Week 1:  OT Short Term Goal 1 (Week 1): Pt will complete toilet transfer with min assist. OT Short Term Goal 1 - Progress (Week 1): Met OT Short Term Goal 2 (Week 1): Pt will utilize AE to complete LB dressing with mod A OT Short Term Goal 2 - Progress (Week 1): Met OT Short Term Goal 3 (Week 1): Pt will engage in functional task in standing for 2 minutes to incresae endurance for ADLs. OT Short Term Goal 3 - Progress (Week 1): Met  Skilled Therapeutic Interventions/Progress Updates:    1:1. Pt received on toilet after bladder movement. Pt completes doffing pants at seated level d/t wanting to take a sink bath. Pt stand pivot transfer to grab bar with MIN A overall with no VC needed for hand placement. Pt bathes at sink level with set up A for UB and MIN A for LB for standing balance. Pt gathers clothing at w/c level with VC for locking B w/c brakes prior to reaching forward to drawers. Pt dresses UB with set up and dons pants with MIN A, socks with S, and shoes with S for slip on shoes. Discussed using transport chair to get into bathroom for bathing if not feeling sturdy on the walker. Pt normally uses stool at home but requires arm rests to push up to standing. Pt also educated on adaptive strategies with lateral leans to bathe buttocks when fatigues and bathing peri area sitting PRN. Continued education on energ conservation during BADL tasks gathering/bringing all clothing into bathroom at home, sequencing saving peri bathing for end of bath, threading LEs/donning socks and shoes all before standing, washing/drying pulling up pants all in 1 stand to decrease energy expended on managing LEs/sit to stands.   Therapy Documentation Precautions:   Precautions Precautions: Fall Precaution Comments: weak and unsteady RLE Restrictions Weight Bearing Restrictions: No General:   Vital Signs:   Pain: Pain Assessment Pain Scale: 0-10 Pain Score: 10-Worst pain ever Pain Type: Acute pain Pain Location: Leg Pain Orientation: Left;Right Pain Descriptors / Indicators: Aching Pain Frequency: Constant Pain Onset: On-going Patients Stated Pain Goal: 4 Pain Intervention(s): Medication (See eMAR) ADL:   Vision   Perception    Praxis   Exercises:   Other Treatments:     Therapy/Group: Individual Therapy  Tonny Branch 03/15/2020, 9:48 AM

## 2020-03-15 NOTE — Progress Notes (Signed)
[place] PHYSICAL MEDICINE & REHABILITATION PROGRESS NOTE  Subjective/Complaints: Woke up at 0200 with significant pain in RLE. Crying/moaning in pain.   ROS: Patient denies fever, rash, sore throat, blurred vision, nausea, vomiting, diarrhea, cough, shortness of breath or chest pain,  or mood change.    Objective: Vital Signs: Blood pressure 123/74, pulse 75, temperature 98.6 F (37 C), temperature source Oral, resp. rate 18, height 5\' 4"  (1.626 m), weight 60.7 kg, SpO2 98 %. No results found. Recent Labs    03/13/20 0626  WBC 8.2  HGB 9.7*  HCT 30.7*  PLT 252   Recent Labs    03/13/20 0626 03/14/20 0534  NA 135 137  K 3.4* 3.7  CL 104 105  CO2 25 26  GLUCOSE 238* 96  BUN 17 17  CREATININE 0.81 0.79  CALCIUM 8.4* 8.5*    Physical Exam: BP 123/74 (BP Location: Left Arm)   Pulse 75   Temp 98.6 F (37 C) (Oral)   Resp 18   Ht 5\' 4"  (1.626 m)   Wt 60.7 kg   SpO2 98%   BMI 22.97 kg/m   Constitutional: No distress . Vital signs reviewed. HEENT: EOMI, oral membranes moist Neck: supple Cardiovascular: RRR without murmur. No JVD    Respiratory/Chest: CTA Bilaterally without wheezes or rales. Normal effort    GI/Abdomen: BS +, non-tender, non-distended Ext: no clubbing, cyanosis, or edema Psych: anxious, tearful.  Skin: Right lateral thigh healing up Psych: Normal mood.  Normal behavior. Musc: No edema in extremities.  RLE tender, hypersensitive to touch.  Neuro: Alert Motor: Right lower extremity: Hip flexion, knee extension 0 to tr/5, ankle dorsiflexion 2-/5 Left lower extremity: Hip flexion, knee extension 4+/5, 0-tr/5 ankle DF, 2 PF. Decreased sensation in BLE, right more than left with sensory loss up to mid trunk.--motor/sensory stable today 8/4   Assessment/Plan: 1. 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  Physiatrist is  providing close team supervision and 24 hour management of active medical problems listed below.  Physiatrist and rehab team continue to assess barriers to discharge/monitor patient progress toward functional and medical goals  Care Tool:  Bathing    Body parts bathed by patient: Right arm, Left arm, Chest, Abdomen, Face, Right upper leg, Left upper leg, Front perineal area, Buttocks, Left lower leg   Body parts bathed by helper: Right lower leg     Bathing assist Assist Level: Minimal Assistance - Patient > 75%     Upper Body Dressing/Undressing Upper body dressing   What is the patient wearing?: Pull over shirt    Upper body assist Assist Level: Supervision/Verbal cueing    Lower Body Dressing/Undressing Lower body dressing      What is the patient wearing?: Pants     Lower body assist Assist for lower body dressing: Maximal Assistance - Patient 25 - 49%     Toileting Toileting    Toileting assist Assist for toileting: 2 Helpers     Transfers Chair/bed transfer  Transfers assist  Chair/bed transfer activity did not occur: Refused (due to 10/10 pain and fatigue)  Chair/bed transfer assist level: Minimal Assistance - Patient > 75%     Locomotion Ambulation   Ambulation assist   Ambulation activity did not occur: Refused (due to 10/10 pain and fatigue)  Assist level: Minimal Assistance - Patient > 75% Assistive device: Walker-rolling Max distance: 15 ft   Walk 10 feet activity  Assist  Walk 10 feet activity did not occur: Refused (due to 10/10 pain and fatigue)  Assist level: Minimal Assistance - Patient > 75% Assistive device: Walker-rolling   Walk 50 feet activity   Assist Walk 50 feet with 2 turns activity did not occur: Refused  Assist level: Minimal Assistance - Patient > 75% Assistive device: Walker-rolling    Walk 150 feet activity   Assist Walk 150 feet activity did not occur: Refused         Walk 10 feet on uneven surface   activity   Assist Walk 10 feet on uneven surfaces activity did not occur: Refused (due to 10/10 pain and fatigue)         Wheelchair     Assist Will patient use wheelchair at discharge?: Yes Type of Wheelchair: Manual Wheelchair activity did not occur: Refused (due to 10/10 pain and fatigue)  Wheelchair assist level: Set up assist Max wheelchair distance: 150 ft    Wheelchair 50 feet with 2 turns activity    Assist    Wheelchair 50 feet with 2 turns activity did not occur: Refused (due to 10/10 pain and fatigue)   Assist Level: Set up assist   Wheelchair 150 feet activity     Assist Wheelchair 150 feet activity did not occur: Refused (due to 10/10 pain and fatigue)   Assist Level: Set up assist     Medical Problem List and Plan: 1. 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  Continue CIR PT, OT, SLP   -spoke with Dr Pattricia Boss yesterday re: Mrs. Schramm. I had a chance to go through her work up as well. 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 Plan will be pain mgt and rehab as there is no specific "treatment" for this.  Neuro denied glycemic control as being related to presentation, severity but I am aware of evidence that glycemic control does impact disease progression.  2.  Antithrombotics: -DVT/anticoagulation:  Pharmaceutical: Continue Lovenox given limited ambulation.             -antiplatelet therapy: N/A 3. Chronic neck/back/Pain Management: Continue Methadone 20 mg tid, Pamelor 25 mg/hs and Nuycnta prn. Tizanidine tid prn for spasms.    8/4: increase gabapentin to 600mg  tid   -increase nucynta to q6 prn    -dc'ed lyrica   -outpt occipital nerve/facet blocks potentially    4.  Mood: LCSW to follow for evaluation ad support.              -antipsychotic agents: N/A 5. Neuropsych: This patient is capable of  making decisions on her own behalf. 6. Skin/Wound Care: Routine pressure relief measures.  7. Fluids/Electrolytes/Nutrition: Monitor I/O.   8/3: Hypokalemia- supplemented with K+ and repeat level 3.7   -daily dosing.  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  9.T2DM with hyperglycemia-poorly controlled: A1c- 8.6.               CBG (last 3)  Recent Labs    03/14/20 1707 03/14/20 2104 03/15/20 0621  GLUCAP 170* 130* 98   8/4 better control since Increase of levemir to 18u bid 8/3  -continue 8u novolog with meals.  10. Normocytic anemia: Stable overall. Monitor for signs of infection.              Hemoglobin 10.1 on 7/24, 10.2 7/26 stable  Continue to monitor 11.  Low calorie malnutrition: Continue supplement 12. Hyponatremia/ chronic Hypomagnesemia:   Sodium 137 on 8/3  Mag normal 1.8 on 7/26 13. Abnormal LFTs: Resolved 14.  Drug-induced constipation: Dulcolax prn effective.   Improving  7/31- said 6+ days since LBM- will change dulcolax to daily and increase to 10 mg daily.   8/2- pt had large BM    Increase bowel meds as necessary 15. Persistent daily HA: On topamax 100 mg/day.   Monitor with increased activity 16.  Thrombocytopenia  Platelets 146 on 7/24, 172 on 7/26  17. Insomnia: Melatonin 5mg  HS   LOS: 12 days A FACE TO FACE EVALUATION WAS PERFORMED  03/15/2020, 8:36 AM

## 2020-03-15 NOTE — Progress Notes (Signed)
Pt states her pain is "horrible, I just can't stand it". [name] was in with patient and adjusted her medications. Patient was encouraged by medication adjustment. Patient was given scheduled and prn analgesics and muscle relaxer. Pt did state upon reassessment that she felt relief of pain.

## 2020-03-16 ENCOUNTER — Inpatient Hospital Stay (HOSPITAL_COMMUNITY): Payer: Medicare Other | Admitting: Occupational Therapy

## 2020-03-16 ENCOUNTER — Inpatient Hospital Stay (HOSPITAL_COMMUNITY): Payer: Medicare Other | Admitting: Physical Therapy

## 2020-03-16 LAB — GLUCOSE, CAPILLARY
Glucose-Capillary: 69 mg/dL — ABNORMAL LOW (ref 70–99)
Glucose-Capillary: 82 mg/dL (ref 70–99)
Glucose-Capillary: 78 mg/dL (ref 70–99)
Glucose-Capillary: 121 mg/dL — ABNORMAL HIGH (ref 70–99)
Glucose-Capillary: 92 mg/dL (ref 70–99)

## 2020-03-16 MED ORDER — INSULIN DETEMIR 100 UNIT/ML ~~LOC~~ SOLN
16.0000 [IU] | Freq: Two times a day (BID) | SUBCUTANEOUS | Status: DC
  Administered 2020-03-16 – 2020-03-18 (×4): 16 [IU] via SUBCUTANEOUS
  Filled 2020-03-16 (×5): qty 0.16

## 2020-03-16 NOTE — Progress Notes (Addendum)
Physical Therapy Session Note  Patient Details  Name: Monica [name] MRN: [id number] Date of Birth: [birth year]  Today's Date: 03/16/2020 PT Individual Time: 3825-0539 PT Individual Time Calculation (min): 75 min   Short Term Goals: Week 2:  PT Short Term Goal 1 (Week 2): Pt will complete bed<>chair transfers with LRAD & CGA. PT Short Term Goal 2 (Week 2): Pt will negotiate 4 steps with 1 rail per home set up with mod assist. PT Short Term Goal 3 (Week 2): Pt will complete car transfer with LRAD & mod assist. Week 3:     Skilled Therapeutic Interventions/Progress Updates:   Pt received sitting in WC and agreeable to PT. Pt transported to rehab gym in [facility]. Gait training with RW and min assist overall x 66f +144f Pt reports increased R hip instability with fatigue, but no sigificant change in gait pattern noted.   Throughout session pt performed sit<>stand with mod assist and heavy UE Support from arm rest and RW. Poor activation and use of RLE while pushing into sitting.   Dynamic standing balance while engage in fine motor task of wii bowling. Pt able to sustain standing x 5 frames intermittently throughout game with 1-2 UE support on RW. Mild R hip/knee instability requiring min-mod assist to prevent LOB from R side buckling.   WC mobility x [id number] room with supervision assist and min cues for doorway management.   Husband present at end of session and PT provided education for application and removal of BAFOs as well as how to monitor for skin break down. [facility] and all needs met.          Therapy Documentation Precautions:  Precautions Precautions: Fall Precaution Comments: weak and unsteady RLE Restrictions Weight Bearing Restrictions: No    Vital Signs: Therapy Vitals Temp: 99.7 F (37.6 C) Temp Source: Oral Pulse Rate: 80 Resp: 17 BP: 104/68 Patient Position (if appropriate): Sitting Oxygen Therapy SpO2: 99 % O2  Device: Room Air Pain: Denies at rest   Therapy/Group: Individual Therapy  AusLorie Phenix5/2021, 5:35 PM

## 2020-03-16 NOTE — Progress Notes (Signed)
Pt states pain is more manageable. States she slept well last night. Pt is in good spirits. Reviewed pain management plan of care, pt is agreeable to care

## 2020-03-16 NOTE — Progress Notes (Addendum)
Monica [name] PHYSICAL MEDICINE & REHABILITATION PROGRESS NOTE  Subjective/Complaints: Pain better controlled overnight and this morning. Happy to have a better idea about what's going on  ROS: Patient denies fever, rash, sore throat, blurred vision, nausea, vomiting, diarrhea, cough, shortness of breath or chest pain,  mood change.     Objective: Vital Signs: Blood pressure 105/63, pulse 68, temperature 98.3 F (36.8 C), temperature source Oral, resp. rate 16, height 5\' 4"  (1.626 m), weight 60.7 kg, SpO2 100 %. No results found. No results for input(s): WBC, HGB, HCT, PLT in the last 72 hours. Recent Labs    03/14/20 0534  NA 137  K 3.7  CL 105  CO2 26  GLUCOSE 96  BUN 17  CREATININE 0.79  CALCIUM 8.5*    Physical Exam: BP 105/63 (BP Location: Right Arm)   Pulse 68   Temp 98.3 F (36.8 C) (Oral)   Resp 16   Ht 5\' 4"  (1.626 m)   Wt 60.7 kg   SpO2 100%   BMI 22.97 kg/m   Constitutional: No distress . Vital signs reviewed. HEENT: EOMI, oral membranes moist Neck: supple Cardiovascular: RRR without murmur. No JVD    Respiratory/Chest: CTA Bilaterally without wheezes or rales. Normal effort    GI/Abdomen: BS +, non-tender, non-distended Ext: no clubbing, cyanosis, or edema Psych: pleasant and cooperative. In much better spirits today Musc: No edema in extremities.  RLE appears less sensitive to touch.  Neuro: Alert Motor: Right lower extremity: Hip flexion, knee extension 0 to tr/5, ankle dorsiflexion 2-/5 Left lower extremity: Hip flexion, knee extension 4+/5, 0-tr/5 ankle DF, 2 PF. Decreased sensation in BLE, right more than left with sensory loss up to mid trunk.--motor/sensory stable today 8/5.  Skin: incisions cdi RLE  Assessment/Plan: 1. 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  Physiatrist is providing close team supervision and 24 hour management  of active medical problems listed below.  Physiatrist and rehab team continue to assess barriers to discharge/monitor patient progress toward functional and medical goals  Care Tool:  Bathing    Body parts bathed by patient: Right arm, Left arm, Chest, Abdomen, Face, Right upper leg, Left upper leg, Front perineal area, Buttocks, Left lower leg   Body parts bathed by helper: Right lower leg     Bathing assist Assist Level: Minimal Assistance - Patient > 75%     Upper Body Dressing/Undressing Upper body dressing   What is the patient wearing?: Pull over shirt    Upper body assist Assist Level: Supervision/Verbal cueing    Lower Body Dressing/Undressing Lower body dressing      What is the patient wearing?: Pants     Lower body assist Assist for lower body dressing: Maximal Assistance - Patient 25 - 49%     Toileting Toileting    Toileting assist Assist for toileting: 2 Helpers     Transfers Chair/bed transfer  Transfers assist  Chair/bed transfer activity did not occur: Refused (due to 10/10 pain and fatigue)  Chair/bed transfer assist level: Minimal Assistance - Patient > 75%     Locomotion Ambulation   Ambulation assist   Ambulation activity did not occur: Refused (due to 10/10 pain and fatigue)  Assist level: Minimal Assistance - Patient > 75% Assistive device: Walker-rolling Max distance: 40 ft   Walk 10 feet activity   Assist  Walk 10 feet activity did not occur: Refused (due to 10/10 pain  and fatigue)  Assist level: Minimal Assistance - Patient > 75% Assistive device: Walker-rolling   Walk 50 feet activity   Assist Walk 50 feet with 2 turns activity did not occur: Refused  Assist level: Minimal Assistance - Patient > 75% Assistive device: Walker-rolling    Walk 150 feet activity   Assist Walk 150 feet activity did not occur: Refused         Walk 10 feet on uneven surface  activity   Assist Walk 10 feet on uneven surfaces  activity did not occur: Refused (due to 10/10 pain and fatigue)         Wheelchair     Assist Will patient use wheelchair at discharge?: Yes Type of Wheelchair: Manual Wheelchair activity did not occur: Refused (due to 10/10 pain and fatigue)  Wheelchair assist level: Set up assist Max wheelchair distance: 150 ft    Wheelchair 50 feet with 2 turns activity    Assist    Wheelchair 50 feet with 2 turns activity did not occur: Refused (due to 10/10 pain and fatigue)   Assist Level: Set up assist   Wheelchair 150 feet activity     Assist Wheelchair 150 feet activity did not occur: Refused (due to 10/10 pain and fatigue)   Assist Level: Set up assist     Medical Problem List and Plan: 1. 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  Continue CIR PT, OT, SLP   -I have spoken with Dr Pattricia Boss  re: Mrs. Fillinger. I had a chance to go through her work up as well. 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 Plan will be pain mgt and rehab as there is no specific "treatment" for this.  Neuro denied glycemic control as being related to presentation, severity but I am aware of evidence that glycemic control does impact disease progression.   -spoke to husband and patient today regarding dx and plan  -goals: increase mobility, aggressive pain mgt, positive pt outlook 2.  Antithrombotics: -DVT/anticoagulation:  Pharmaceutical: Continue Lovenox given limited ambulation.             -antiplatelet therapy: N/A 3. Chronic neck/back/Pain Management: Continue Methadone 20 mg tid, Pamelor 25 mg/hs and Nuycnta prn. Tizanidine tid prn for spasms.    8/4: increased gabapentin to 600mg  tid   -increase nucynta to q6 prn    -dc'ed lyrica  8/5 pain control improved today    4.  Mood: LCSW to follow for evaluation ad support.              -antipsychotic agents:  N/A 5. Neuropsych: This patient is capable of making decisions on her own behalf. 6. Skin/Wound Care: Routine pressure relief measures.  7. Fluids/Electrolytes/Nutrition: Monitor I/O.   8/3: Hypokalemia- supplemented with K+ and repeat level 3.7   -continue daily dosing.  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  9.T2DM with hyperglycemia-poorly controlled: A1c- 8.6.               CBG (last 3)  Recent Labs    03/15/20 2120 03/16/20 0621 03/16/20 0649  GLUCAP 94 69* 82   8/4 better control since Increase of levemir to 18u bid 8/3  -continue 8u novolog with meals.  8/5 sugars low now that steroids off, will back off levemir to 16u bid 10. Normocytic anemia: Stable overall. Monitor for signs of infection.  Hemoglobin 10.1 on 7/24, 10.2 7/26 stable  Continue to monitor 11. Low calorie malnutrition: Continue supplement 12. Hyponatremia/ chronic Hypomagnesemia:   Sodium 137 on 8/3  Mag normal 1.8 on 7/26 13. Abnormal LFTs: Resolved 14.  Drug-induced constipation: Dulcolax prn effective.   Improving  7/31- said 6+ days since LBM- will change dulcolax to daily and increase to 10 mg daily.   8/4- pt had large BM    Increase bowel meds as necessary 15. Persistent daily HA: On topamax 100 mg/day.   Monitor with increased activity 16.  Thrombocytopenia  Platelets 146 on 7/24, 172 on 7/26  17. Insomnia: Melatonin 5mg  HS   LOS: 13 days A FACE TO FACE EVALUATION WAS PERFORMED  03/16/2020, 10:46 AM

## 2020-03-16 NOTE — Progress Notes (Signed)
Occupational Therapy Session Note  Patient Details  Name: Monica [name] MRN: [id number] Date of Birth: [birth year]  Today's Date: 03/16/2020  Session 1 OT Individual Time: 1478-2956 OT Individual Time Calculation (min): 60 min   Session 2 OT Individual Time: 1110-1200 OT Individual Time Calculation (min): 50 min    Short Term Goals: Week 2:  OT Short Term Goal 1 (Week 2): LTG=STG 2/2 ELOS  Skilled Therapeutic Interventions/Progress Updates: Session 1    Pt greeted seated in wc and agreeable to OT treatment session. Pt declined bathing/dressing today, but needed to don shoes. Pt's shoes had been returned by [employer] with toe cap, but AFO's had not been delivered yet. Worked on figure 4 position to [employer]. Pt able to tie shoes in figure 4. Worked on wc propulsion to therapy gym. Standing balance/endurance standing on foam block while completing pipe tree puzzle. Pt tolerated standing for 5 minute interval x2 with OT assist to maintain R knee in extension. Pt propelled wc back to room and left seated in wc with alarm belt on, call bell in reach, and needs met.   Session 2 Pt greeted seated in wc and agreeable to OT treatment session. Pt's AFOs had been delivered and pt eager to try them out. Worked on functional ambulation 30 feet x 2 with min A and RW. Worked on step length and maintaining R knee extension when stepping through. Seated LE there-ex with 3 sets of 10 hip adduction/abduction, hip extension, and knee extension. Pt returned to room and left with nurse tech to get to the bathroom.   Therapy Documentation Precautions:  Precautions Precautions: Fall Precaution Comments: weak and unsteady RLE Restrictions Weight Bearing Restrictions: No Pain: Pain Assessment Pain Score: 3    Therapy/Group: Individual Therapy  Valma Cava 03/16/2020, 12:15 PM

## 2020-03-17 ENCOUNTER — Inpatient Hospital Stay (HOSPITAL_COMMUNITY): Payer: Medicare Other | Admitting: Occupational Therapy

## 2020-03-17 ENCOUNTER — Encounter (HOSPITAL_COMMUNITY): Payer: Medicare Other | Admitting: Occupational Therapy

## 2020-03-17 ENCOUNTER — Encounter (HOSPITAL_COMMUNITY): Payer: Medicare Other

## 2020-03-17 ENCOUNTER — Ambulatory Visit (HOSPITAL_COMMUNITY): Payer: Medicare Other | Admitting: Physical Therapy

## 2020-03-17 LAB — GLUCOSE, CAPILLARY
Glucose-Capillary: 70 mg/dL (ref 70–99)
Glucose-Capillary: 205 mg/dL — ABNORMAL HIGH (ref 70–99)
Glucose-Capillary: 89 mg/dL (ref 70–99)
Glucose-Capillary: 115 mg/dL — ABNORMAL HIGH (ref 70–99)

## 2020-03-17 NOTE — Progress Notes (Signed)
Physical Therapy Weekly Progress Note  Patient Details  Name: DEMIAH GULLICKSON MRN: [id number] Date of Birth: March 20, 1951  Beginning of progress report period: March 11, 2020 End of progress report period: March 17, 2020  Today's Date: 03/17/2020   Patient has met 1 of 3 short term goals.  Pt is making slow progress with functional mobility & currently requires min/mod assist for transfers & min assist for short distance gait. MD did clear pt to use AFO's & AFO consult completed with recommendation of B PLS AFO's with toe caps to increase safety & energy efficiency with gait. Pt's goals have previously been downgraded to min assist overall with recommendations pt does not negotiate stairs at home due to significant BLE weakness & pt & husband voice understanding & are in agreement. Pt would benefit from continued PT for pt/family education & caregiver training prior to d/c home. Anticipate pt will need at least min assist hands on assist at d/c to ensure safety with mobility & reduce fall risk.   Patient continues to demonstrate the following deficits muscle weakness and muscle joint tightness, decreased cardiorespiratoy endurance, decreased coordination, and decreased standing balance, decreased postural control and decreased balance strategies and therefore will continue to benefit from skilled PT intervention to increase functional independence with mobility.  Patient progressing toward long term goals..  Continue plan of care.  PT Short Term Goals Week 2:  PT Short Term Goal 1 (Week 2): Pt will complete bed<>chair transfers with LRAD & CGA. PT Short Term Goal 1 - Progress (Week 2): Not met PT Short Term Goal 2 (Week 2): Pt will negotiate 4 steps with 1 rail per home set up with mod assist. PT Short Term Goal 2 - Progress (Week 2): Not met PT Short Term Goal 3 (Week 2): Pt will complete car transfer with LRAD & mod assist. PT Short Term Goal 3 - Progress (Week 2): Met Week 3:  PT Short Term  Goal 1 (Week 3): STG = LTG due to anticipated d/c date.    Therapy Documentation Precautions:  Precautions Precautions: Fall Precaution Comments: weak and unsteady RLE Restrictions Weight Bearing Restrictions: No   Therapy/Group: Individual Therapy  Waunita Schooner 03/17/2020, 9:27 AM

## 2020-03-17 NOTE — Progress Notes (Signed)
Physical Therapy Session Note  Patient Details  Name: Monica [name] MRN: [id number] Date of Birth: 02/26/1951  Today's Date: 03/17/2020 PT Individual Time: 0263-7858 PT Individual Time Calculation (min): 57 min   Short Term Goals: Week 3:  PT Short Term Goal 1 (Week 3): STG = LTG due to anticipated d/c date.  Skilled Therapeutic Interventions/Progress Updates:  Pt received in w/c & agreeable to tx. Transported pt outside via w/c dependent assist for time management. Pt's husband Monica [name]) present with Monica [name] to practice car transfer. Pt with B AFO's and tennis shoes donned already but unable to fully dorsiflex R ankle to neutral, and potentially due to tight fitting shoes, unable to place R heel flat inside shoe. Pt transfers sit<>stand multiple times with mod assist and unable to fully extend R knee due to position that ankle is forcing RLE into, as well as significant RLE weakness, so therefore unable to safely attempt stand pivot w/c<>car. PT instructs pt on squat pivot transfer w/c<>car with use of car door (only due to PT blocking swinging motion) and armrests with PT blocking R knee and providing max education/instruction re: head/hips relationship. Pt completes car transfer in this manner with mod assist overall & max cuing for safety. PT & pt/husband discussed need to try bigger shoes to see if that helps pt place RLE flat in shoe - Monica [name] to bring in bigger shoes over the weekend with plans to try car transfer again on Monday. Educated Monica [name] on potential need for slide board if BLE weakness persists to this extent. Back on unit pt propels w/c [place] unit>room with BUE & supervision. Pt requests to return to bed & completes lateral scoot w/c>bed with min assist, mod assist for supine>sit to bring BLE onto bed. Provided pt with sheet & educated pt on heel cord stretch with pt return demonstrating; instructed pt to hold stretch 30-60 seconds, at least 3x on each LE. Pt left in bed with alarm  set, call bell & all needs in reach, [name] in room.  Therapy Documentation Precautions:  Precautions Precautions: Fall Precaution Comments: weak and unsteady RLE Restrictions Weight Bearing Restrictions: No  Pain: No formal c/o pain during session.  Therapy/Group: Individual Therapy  Sandi Mariscal 03/17/2020, 4:12 PM

## 2020-03-17 NOTE — Progress Notes (Signed)
[place] PHYSICAL MEDICINE & REHABILITATION PROGRESS NOTE  Subjective/Complaints: Had a bad evening/morning with pain. Feels that she "wants to cut her leg off below the knee". Having right sided lower trunk symptoms too  ROS: Patient denies fever, rash, sore throat, blurred vision, nausea, vomiting, diarrhea, cough, shortness of breath or chest pain,  or mood change.     Objective: Vital Signs: Blood pressure 120/71, pulse 74, temperature 98.4 F (36.9 C), resp. rate 18, height 5\' 4"  (1.626 m), weight 60.7 kg, SpO2 96 %. No results found. No results for input(s): WBC, HGB, HCT, PLT in the last 72 hours. No results for input(s): NA, K, CL, CO2, GLUCOSE, BUN, CREATININE, CALCIUM in the last 72 hours.  Physical Exam: BP 120/71 (BP Location: Left Arm)   Pulse 74   Temp 98.4 F (36.9 C)   Resp 18   Ht 5\' 4"  (1.626 m)   Wt 60.7 kg   SpO2 96%   BMI 22.97 kg/m   Constitutional: No distress . Vital signs reviewed. HEENT: EOMI, oral membranes moist Neck: supple Cardiovascular: RRR without murmur. No JVD    Respiratory/Chest: CTA Bilaterally without wheezes or rales. Normal effort    GI/Abdomen: BS +, non-tender, non-distended Ext: no clubbing, cyanosis, or edema Psych: anxious, better when distracted from pain Musc: No edema in extremities.  RLE appears sensitive to touch.  Neuro: Alert Motor: Right lower extremity: Hip flexion, knee extension 0 to tr/5, ankle dorsiflexion 2-/5 Left lower extremity: Hip flexion, knee extension 4+/5, 0-tr/5 ankle DF, 2 PF. Decreased sensation in BLE, right more than left with sensory loss up to mid trunk.--motor/sensory stable today 8/6  Skin: incisions cdi RLE   Assessment/Plan: 1. 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  Physiatrist is providing close team supervision and 24 hour management of active medical problems listed  below.  Physiatrist and rehab team continue to assess barriers to discharge/monitor patient progress toward functional and medical goals  Care Tool:  Bathing    Body parts bathed by patient: Right arm, Left arm, Chest, Abdomen, Face, Right upper leg, Left upper leg, Front perineal area, Buttocks, Left lower leg   Body parts bathed by helper: Right lower leg     Bathing assist Assist Level: Minimal Assistance - Patient > 75%     Upper Body Dressing/Undressing Upper body dressing   What is the patient wearing?: Pull over shirt    Upper body assist Assist Level: Supervision/Verbal cueing    Lower Body Dressing/Undressing Lower body dressing      What is the patient wearing?: Pants     Lower body assist Assist for lower body dressing: Maximal Assistance - Patient 25 - 49%     Toileting Toileting    Toileting assist Assist for toileting: 2 Helpers     Transfers Chair/bed transfer  Transfers assist  Chair/bed transfer activity did not occur: Refused (due to 10/10 pain and fatigue)  Chair/bed transfer assist level: Minimal Assistance - Patient > 75%     Locomotion Ambulation   Ambulation assist   Ambulation activity did not occur: Refused (due to 10/10 pain and fatigue)  Assist level: Minimal Assistance - Patient > 75% Assistive device: Walker-rolling Max distance: 40 ft   Walk 10 feet activity   Assist  Walk 10 feet activity did not occur: Refused (due to 10/10 pain and fatigue)  Assist level: Minimal Assistance - Patient > 75% Assistive device:  Walker-rolling   Walk 50 feet activity   Assist Walk 50 feet with 2 turns activity did not occur: Refused  Assist level: Minimal Assistance - Patient > 75% Assistive device: Walker-rolling    Walk 150 feet activity   Assist Walk 150 feet activity did not occur: Refused         Walk 10 feet on uneven surface  activity   Assist Walk 10 feet on uneven surfaces activity did not occur: Refused  (due to 10/10 pain and fatigue)         Wheelchair     Assist Will patient use wheelchair at discharge?: Yes Type of Wheelchair: Manual Wheelchair activity did not occur: Refused (due to 10/10 pain and fatigue)  Wheelchair assist level: Set up assist Max wheelchair distance: 150 ft    Wheelchair 50 feet with 2 turns activity    Assist    Wheelchair 50 feet with 2 turns activity did not occur: Refused (due to 10/10 pain and fatigue)   Assist Level: Set up assist   Wheelchair 150 feet activity     Assist Wheelchair 150 feet activity did not occur: Refused (due to 10/10 pain and fatigue)   Assist Level: Set up assist     Medical Problem List and Plan: 1. 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   -have spoken to husband and patient regarding dx and plan  -goals: increase mobility, aggressive pain mgt, glucose control, positive pt outlook 2.  Antithrombotics: -DVT/anticoagulation:  Pharmaceutical: Continue Lovenox given limited ambulation.             -antiplatelet therapy: N/A 3. Chronic neck/back/Pain Management: Continue Methadone 20 mg tid, Pamelor 25 mg/hs and Nuycnta prn. Tizanidine tid prn for spasms.    8/4: increased gabapentin to 600mg  tid   -increased nucynta to q6 prn    -dc'ed lyrica  8/6 pain can wax and wane   -discussed massage, sensory stimulation of RLE   -asked neuropsych to follow up re: relaxation/pain mgt techniques   -continue to work on leisure/therapy activities    -provide positive reinforcement    4.  Mood: LCSW to follow for evaluation ad support.              -antipsychotic agents: N/A 5. Neuropsych: This patient is capable of making decisions on her own behalf. 6. Skin/Wound Care: Routine pressure relief measures.  7.  Fluids/Electrolytes/Nutrition: Monitor I/O.   8/3: Hypokalemia- supplemented with K+ and repeat level 3.7   -continue daily dosing.  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  9.T2DM with hyperglycemia-poorly controlled: A1c- 8.6.               CBG (last 3)  Recent Labs    03/16/20 1622 03/16/20 2116 03/17/20 0629  GLUCAP 121* 92 70   8/4 better control since Increase of levemir to 18u bid 8/3  -continue 8u novolog with meals.  8/6 sugars leveling off now that steroids d/c'ed.   -levemir decreased to 16u bid 8/5  -follow for consistent pattern 10. Normocytic anemia: Stable overall. Monitor for signs of infection.              Hemoglobin 10.1 on 7/24, 10.2 7/26 stable  CBC monday 11. Low calorie malnutrition: Continue supplement 12. Hyponatremia/ chronic Hypomagnesemia:   Sodium  137 on 8/3  Mag normal 1.8 on 7/26 13. Abnormal LFTs: Resolved 14.  Drug-induced constipation: Dulcolax 10mg  daily effective.   Improving  8/4- pt had large BM    Increase bowel meds as necessary 15. Persistent daily HA: On topamax 100 mg/day.   Monitor with increased activity 16.  Thrombocytopenia  Platelets 146 on 7/24, 172 on 7/26  -CBC monday  17. Insomnia: Melatonin 5mg  HS   LOS: 14 days A FACE TO FACE EVALUATION WAS PERFORMED  8/26 03/17/2020, 12:02 PM

## 2020-03-17 NOTE — Plan of Care (Signed)
  Problem: Consults Goal: [facility] GENERAL PATIENT EDUCATION Description: See Patient Education module for education specifics. Outcome: Progressing Goal: Skin Care Protocol Initiated - if Braden Score 18 or less Description: If consults are not indicated, leave blank or document N/A Outcome: Progressing Goal: Diabetes Guidelines if Diabetic/Glucose > 140 Description: If diabetic or lab glucose is > 140 mg/dl - Initiate Diabetes/Hyperglycemia Guidelines & Document Interventions  Outcome: Progressing   Problem: RH SKIN INTEGRITY Goal: RH STG SKIN FREE OF INFECTION/BREAKDOWN Description: Skin to remain free of infection with mod assist Outcome: Progressing Goal: RH STG MAINTAIN SKIN INTEGRITY WITH ASSISTANCE Description: STG Maintain Skin Integrity With  mod Assistance. Outcome: Progressing   Problem: RH SAFETY Goal: RH STG ADHERE TO SAFETY PRECAUTIONS W/ASSISTANCE/DEVICE Description: STG Adhere to Safety Precautions With  mod Assistance/Device. Outcome: Progressing Goal: RH STG DECREASED RISK OF FALL WITH ASSISTANCE Description: STG Decreased Risk of Fall With mod  Assistance. Outcome: Progressing   Problem: RH PAIN MANAGEMENT Goal: RH STG PAIN MANAGED AT OR BELOW PT'S PAIN GOAL Description: Pain <3  Outcome: Progressing   

## 2020-03-17 NOTE — Progress Notes (Signed)
Occupational Therapy Session Note  Patient Details  Name: Monica [name] MRN: [id number] Date of Birth: [birth year]  Today's Date: 03/17/2020  Session 1 OT Individual Time: 0915-1000 OT Individual Time Calculation (min): 45 min   Session 2 OT Individual Time: 1300-1345 OT Individual Time Calculation (min): 45 min    Short Term Goals: Week 2:  OT Short Term Goal 1 (Week 2): LTG=STG 2/2 ELOS  Skilled Therapeutic Interventions/Progress Updates:  Session 1   Pt greeted seated in wc and agreeable to OT treatment session. PT reported high pain in R LE today, but wanted to try to work through the pain and shower. Pt brought into bathroom and completed squat-pivot onto tub bench with min A. Bathing completed using lateral leans and was able to wash buttocks. Stand-pivot out of shower with mod A 2/2 R knee buckling. Dressing completed from wc with pt able to access drawers from wc level and collect clothing. Pt donned shirt without assist, then was able to thread pants with supervision. Sit<>stand at the sink with min A and R knee block, then pt able to pull pants up in standing with min A for balance. Pt left seated in wc at the sink to brush and dry her hair. Chair alarm on, call bell in reach, and needs met.   Session 2 Pt greeted seated in wc finishing lunch. Family education scheduled at this time, but husband not present. OT discussed home set-up with focus on bathroom while pt finishing lunch. Pt plans to have husband push her in wc to back of the house to level entry to get to walk-in shower in the basement. Per patient, walk-in shower still has about a 6 inch ledge to get over. Pt has history of falls in the tub shower. OT pulled up picture of tub transfer bench and educated on uses. Will have to discuss further with spouse if this is an option that will work in their home set-up. Worked on donning B shoes with AFO's. Used figure 4 position and pushing foot into shoe on the floor. Pt needed more  than reasonable amount of time and mod A to get shoes on. Pt left seated In wc with alarm belt on and awaiting next PT session.   Therapy Documentation Precautions:  Precautions Precautions: Fall Precaution Comments: weak and unsteady RLE Restrictions Weight Bearing Restrictions: No Pain:  Pt reports R LE pain at 8/10. Rest, repositioned, and distraction for pain management.    Therapy/Group: Individual Therapy  Valma Cava 03/17/2020, 3:20 PM

## 2020-03-17 NOTE — Progress Notes (Signed)
Occupational Therapy Session Note  Patient Details  Name: Monica [name] MRN: [id number] Date of Birth: [birth year]  Today's Date: 03/17/2020 OT Group Time: 1100-1200 OT Group Time Calculation (min): 60 min    Short Term Goals: Week 1:  OT Short Term Goal 1 (Week 1): Pt will complete toilet transfer with min assist. OT Short Term Goal 1 - Progress (Week 1): Met OT Short Term Goal 2 (Week 1): Pt will utilize AE to complete LB dressing with mod A OT Short Term Goal 2 - Progress (Week 1): Met OT Short Term Goal 3 (Week 1): Pt will engage in functional task in standing for 2 minutes to incresae endurance for ADLs. OT Short Term Goal 3 - Progress (Week 1): Met  Skilled Therapeutic Interventions/Progress Updates:    Pt received in w/c agreeable to group. Pain stated at beginning of session 5/10 in back/LEs. Repositioning, rest, and alternative strategies approached in session. Pt participated in an adaptive yoga group to addresses balance, strengthening, improving ROM/flexibility, alternative pain strategies, stress reduction and improve social participation/adjustment to situation by interacting with peers. Patient was led through warm up exercises for all 4 limbs, formal adapted seated yoga poses, meditation and breath-work to address the above deficits. Pt states, " I have a lot of things I can use when I get home!" Pt tolerated well and returned to room with call light in reach and all needs met.  Therapy Documentation Precautions:  Precautions Precautions: Fall Precaution Comments: weak and unsteady RLE Restrictions Weight Bearing Restrictions: No General:   Vital Signs:   Pain: Pain Assessment Pain Scale: 0-10 Pain Score: 10-Worst pain ever Pain Type: Chronic pain Pain Location: Leg Pain Orientation: Left;Right Pain Descriptors / Indicators: Aching;Throbbing Pain Frequency: Constant Pain Onset: On-going Patients Stated Pain Goal: 1 Pain Intervention(s): Medication (See  eMAR) ADL:   Vision   Perception    Praxis   Exercises:   Other Treatments:     Therapy/Group: Group Therapy  Tonny Branch 03/17/2020, 12:07 PM

## 2020-03-18 DIAGNOSIS — E1144 Type 2 diabetes mellitus with diabetic amyotrophy: Secondary | ICD-10-CM | POA: Diagnosis not present

## 2020-03-18 LAB — GLUCOSE, CAPILLARY
Glucose-Capillary: 94 mg/dL (ref 70–99)
Glucose-Capillary: 182 mg/dL — ABNORMAL HIGH (ref 70–99)
Glucose-Capillary: 141 mg/dL — ABNORMAL HIGH (ref 70–99)
Glucose-Capillary: 102 mg/dL — ABNORMAL HIGH (ref 70–99)

## 2020-03-18 MED ORDER — GABAPENTIN 400 MG PO CAPS
400.0000 mg | ORAL_CAPSULE | Freq: Three times a day (TID) | ORAL | Status: DC
  Administered 2020-03-18 – 2020-03-19 (×3): 400 mg via ORAL
  Filled 2020-03-18 (×3): qty 1

## 2020-03-18 MED ORDER — INSULIN DETEMIR 100 UNIT/ML ~~LOC~~ SOLN
14.0000 [IU] | Freq: Two times a day (BID) | SUBCUTANEOUS | Status: DC
  Administered 2020-03-18 – 2020-03-24 (×12): 14 [IU] via SUBCUTANEOUS
  Filled 2020-03-18 (×13): qty 0.14

## 2020-03-18 NOTE — Plan of Care (Signed)
°  Problem: Consults Goal: RH GENERAL PATIENT EDUCATION Description: See Patient Education module for education specifics. Outcome: Progressing Goal: Skin Care Protocol Initiated - if Braden Score 18 or less Description: If consults are not indicated, leave blank or document N/A Outcome: Progressing Goal: Diabetes Guidelines if Diabetic/Glucose > 140 Description: If diabetic or lab glucose is > 140 mg/dl - Initiate Diabetes/Hyperglycemia Guidelines & Document Interventions  Outcome: Progressing   Problem: RH SKIN INTEGRITY Goal: RH STG SKIN FREE OF INFECTION/BREAKDOWN Description: Skin to remain free of infection with mod assist Outcome: Progressing Goal: RH STG MAINTAIN SKIN INTEGRITY WITH ASSISTANCE Description: STG Maintain Skin Integrity With  mod Assistance. Outcome: Progressing   Problem: RH SAFETY Goal: RH STG ADHERE TO SAFETY PRECAUTIONS W/ASSISTANCE/DEVICE Description: STG Adhere to Safety Precautions With  mod Assistance/Device. Outcome: Progressing Goal: RH STG DECREASED RISK OF FALL WITH ASSISTANCE Description: STG Decreased Risk of Fall With mod  Assistance. Outcome: Progressing   Problem: RH PAIN MANAGEMENT Goal: RH STG PAIN MANAGED AT OR BELOW PT'S PAIN GOAL Description: Pain <3  Outcome: Progressing   

## 2020-03-18 NOTE — Progress Notes (Signed)
[name] PHYSICAL MEDICINE & REHABILITATION PROGRESS NOTE  Subjective/Complaints:  Pt in bed , slowed responses but oriented  ROS: Patient denies CP, SOB, N/V/D    Objective: Vital Signs: Blood pressure 128/68, pulse 86, temperature 98.3 F (36.8 C), temperature source Oral, resp. rate 18, height 5\' 4"  (1.626 m), weight 60.7 kg, SpO2 96 %. No results found. No results for input(s): WBC, HGB, HCT, PLT in the last 72 hours. No results for input(s): NA, K, CL, CO2, GLUCOSE, BUN, CREATININE, CALCIUM in the last 72 hours.  Physical Exam: BP 128/68 (BP Location: Left Arm)   Pulse 86   Temp 98.3 F (36.8 C) (Oral)   Resp 18   Ht 5\' 4"  (1.626 m)   Wt 60.7 kg   SpO2 96%   BMI 22.97 kg/m    General: No acute distress Mood and affect are appropriate Heart: Regular rate and rhythm no rubs murmurs or extra sounds Lungs: Clear to auscultation, breathing unlabored, no rales or wheezes Abdomen: Positive bowel sounds, soft nontender to palpation, nondistended Extremities: No clubbing, cyanosis, or edema Skin: No evidence of breakdown, no evidence of rash   Motor: Right lower extremity: Hip flexion, knee extension 0 to tr/5, ankle dorsiflexion 2-/5 Left lower extremity: Hip flexion, knee extension 4+/5, 0-tr/5 ankle DF, 2 PF. Decreased sensation in BLE, right more than left with sensory loss up to mid trunk.--motor/sensory stable today 8/6  Skin: incisions cdi RLE   Assessment/Plan: 1. 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  Physiatrist is providing close team supervision and 24 hour management of active medical problems listed below.  Physiatrist and rehab team continue to assess barriers to discharge/monitor patient progress toward functional and medical goals  Care Tool:  Bathing    Body parts bathed by patient: Right arm, Left arm, Chest, Abdomen, Face,  Right upper leg, Left upper leg, Front perineal area, Buttocks, Left lower leg   Body parts bathed by helper: Right lower leg     Bathing assist Assist Level: Minimal Assistance - Patient > 75%     Upper Body Dressing/Undressing Upper body dressing   What is the patient wearing?: Pull over shirt    Upper body assist Assist Level: Supervision/Verbal cueing    Lower Body Dressing/Undressing Lower body dressing      What is the patient wearing?: Pants     Lower body assist Assist for lower body dressing: Maximal Assistance - Patient 25 - 49%     Toileting Toileting    Toileting assist Assist for toileting: 2 Helpers     Transfers Chair/bed transfer  Transfers assist  Chair/bed transfer activity did not occur: Refused (due to 10/10 pain and fatigue)  Chair/bed transfer assist level: Minimal Assistance - Patient > 75%     Locomotion Ambulation   Ambulation assist   Ambulation activity did not occur: Refused (due to 10/10 pain and fatigue)  Assist level: Minimal Assistance - Patient > 75% Assistive device: Walker-rolling Max distance: 40 ft   Walk 10 feet activity   Assist  Walk 10 feet activity did not occur: Refused (due to 10/10 pain and fatigue)  Assist level: Minimal Assistance - Patient > 75% Assistive device: Walker-rolling   Walk 50 feet activity   Assist Walk 50 feet with 2 turns activity did not occur: Refused  Assist level: Minimal Assistance - Patient > 75% Assistive device: Walker-rolling    Walk 150 feet activity  Assist Walk 150 feet activity did not occur: Refused         Walk 10 feet on uneven surface  activity   Assist Walk 10 feet on uneven surfaces activity did not occur: Refused (due to 10/10 pain and fatigue)         Wheelchair     Assist Will patient use wheelchair at discharge?: Yes Type of Wheelchair: Manual Wheelchair activity did not occur: Refused (due to 10/10 pain and fatigue)  Wheelchair assist  level: Set up assist Max wheelchair distance: 150 ft    Wheelchair 50 feet with 2 turns activity    Assist    Wheelchair 50 feet with 2 turns activity did not occur: Refused (due to 10/10 pain and fatigue)   Assist Level: Set up assist   Wheelchair 150 feet activity     Assist Wheelchair 150 feet activity did not occur: Refused (due to 10/10 pain and fatigue)   Assist Level: Set up assist     Medical Problem List and Plan: 1. 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     -goals: increase mobility, aggressive pain mgt, glucose control, positive pt outlook 2.  Antithrombotics: -DVT/anticoagulation:  Pharmaceutical: Continue Lovenox given limited ambulation.             -antiplatelet therapy: N/A 3. Chronic neck/back/Pain Management: Continue Methadone 20 mg tid, Pamelor 25 mg/hs and Nuycnta prn. Tizanidine tid prn for spasms.    8/4: increased gabapentin to 600mg  tid- increased sedation reduce to 400mg  TID   -increased nucynta to q6 prn    -dc'ed lyrica Delayed responses, will monitor for sedation   8/6 pain can wax and wan   -discussed massage, sensory stimulation of RLE   -asked neuropsych to follow up re: relaxation/pain mgt techniques   -continue to work on leisure/therapy activities    -provide positive reinforcement    4.  Mood: LCSW to follow for evaluation ad support.              -antipsychotic agents: N/A 5. Neuropsych: This patient is capable of making decisions on her own behalf. 6. Skin/Wound Care: Routine pressure relief measures.  7. Fluids/Electrolytes/Nutrition: Monitor I/O.   8/3: Hypokalemia- supplemented with K+ and repeat level 3.7   -continue daily dosing.  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  9.T2DM  with hyperglycemia-poorly controlled: A1c- 8.6.               CBG (last 3)  Recent Labs    03/17/20 1647 03/17/20 2109 03/18/20 0618  GLUCAP 89 115* 94   8/4 better control since Increase of levemir to 18u bid 8/3  -continue 8u novolog with meals.  8/6 sugars leveling off now that steroids d/c'ed.   -levemir decreased to 16u bid 8/5  Will reduce to 14 U 8/7 10. Normocytic anemia: Stable overall. Monitor for signs of infection.              Hemoglobin 10.1 on 7/24, 10.2 7/26 stable  CBC monday 11. Low calorie malnutrition: Continue supplement 12. Hyponatremia/ chronic Hypomagnesemia:   Sodium 137 on 8/3  Mag normal 1.8 on 7/26 13. Abnormal LFTs: Resolved 14.  Drug-induced constipation: Dulcolax 10mg  daily effective.   Improving  8/4- pt had large BM    Increase bowel meds  as necessary 15. Persistent daily HA: On topamax 100 mg/day.   Monitor with increased activity 16.  Thrombocytopenia  Platelets 146 on 7/24, 172 on 7/26  -CBC monday  17. Insomnia: Melatonin 5mg  HS   LOS: 15 days A FACE TO FACE EVALUATION WAS PERFORMED  03/18/2020, 10:13 AM

## 2020-03-19 ENCOUNTER — Inpatient Hospital Stay (HOSPITAL_COMMUNITY): Payer: Medicare Other

## 2020-03-19 ENCOUNTER — Inpatient Hospital Stay (HOSPITAL_COMMUNITY): Payer: Medicare Other | Admitting: Occupational Therapy

## 2020-03-19 DIAGNOSIS — E1144 Type 2 diabetes mellitus with diabetic amyotrophy: Secondary | ICD-10-CM | POA: Diagnosis not present

## 2020-03-19 LAB — GLUCOSE, CAPILLARY
Glucose-Capillary: 127 mg/dL — ABNORMAL HIGH (ref 70–99)
Glucose-Capillary: 137 mg/dL — ABNORMAL HIGH (ref 70–99)
Glucose-Capillary: 73 mg/dL (ref 70–99)
Glucose-Capillary: 129 mg/dL — ABNORMAL HIGH (ref 70–99)

## 2020-03-19 MED ORDER — GABAPENTIN 300 MG PO CAPS
600.0000 mg | ORAL_CAPSULE | Freq: Three times a day (TID) | ORAL | Status: DC
  Administered 2020-03-19 – 2020-03-20 (×4): 600 mg via ORAL
  Filled 2020-03-19 (×4): qty 2

## 2020-03-19 NOTE — Progress Notes (Signed)
[facility] PHYSICAL MEDICINE & REHABILITATION PROGRESS NOTE  Subjective/Complaints:  More alert but increased leg pain today   ROS: Patient denies CP, SOB, N/V/D    Objective: Vital Signs: Blood pressure 133/67, pulse 82, temperature 98.9 F (37.2 C), temperature source Oral, resp. rate 19, height 5\' 4"  (1.626 m), weight 60.7 kg, SpO2 99 %. No results found. No results for input(s): WBC, HGB, HCT, PLT in the last 72 hours. No results for input(s): NA, K, CL, CO2, GLUCOSE, BUN, CREATININE, CALCIUM in the last 72 hours.  Physical Exam: BP 133/67 (BP Location: Left Arm)   Pulse 82   Temp 98.9 F (37.2 C) (Oral)   Resp 19   Ht 5\' 4"  (1.626 m)   Wt 60.7 kg   SpO2 99%   BMI 22.97 kg/m    General: No acute distress Mood and affect are appropriate Heart: Regular rate and rhythm no rubs murmurs or extra sounds Lungs: Clear to auscultation, breathing unlabored, no rales or wheezes Abdomen: Positive bowel sounds, soft nontender to palpation, nondistended Extremities: No clubbing, cyanosis, or edema Skin: No evidence of breakdown, no evidence of rash   Motor: Right lower extremity: Hip flexion, knee extension 0 to tr/5, ankle dorsiflexion 2-/5 Left lower extremity: Hip flexion, knee extension 4+/5, 0-tr/5 ankle DF, 2 PF. Decreased sensation in BLE, right more than left with sensory loss up to mid trunk.--motor/sensory stable today 8/6  Skin: incisions cdi RLE   Assessment/Plan: 1. 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  Physiatrist is providing close team supervision and 24 hour management of active medical problems listed below.  Physiatrist and rehab team continue to assess barriers to discharge/monitor patient progress toward functional and medical goals  Care Tool:  Bathing    Body parts bathed by patient: Right arm, Left arm, Chest, Abdomen, Face, Right  upper leg, Left upper leg, Front perineal area, Buttocks, Left lower leg   Body parts bathed by helper: Right lower leg     Bathing assist Assist Level: Minimal Assistance - Patient > 75%     Upper Body Dressing/Undressing Upper body dressing   What is the patient wearing?: Pull over shirt    Upper body assist Assist Level: Supervision/Verbal cueing    Lower Body Dressing/Undressing Lower body dressing      What is the patient wearing?: Pants     Lower body assist Assist for lower body dressing: Maximal Assistance - Patient 25 - 49%     Toileting Toileting    Toileting assist Assist for toileting: 2 Helpers     Transfers Chair/bed transfer  Transfers assist  Chair/bed transfer activity did not occur: Refused (due to 10/10 pain and fatigue)  Chair/bed transfer assist level: Minimal Assistance - Patient > 75%     Locomotion Ambulation   Ambulation assist   Ambulation activity did not occur: Refused (due to 10/10 pain and fatigue)  Assist level: Minimal Assistance - Patient > 75% Assistive device: Walker-rolling Max distance: 40 ft   Walk 10 feet activity   Assist  Walk 10 feet activity did not occur: Refused (due to 10/10 pain and fatigue)  Assist level: Minimal Assistance - Patient > 75% Assistive device: Walker-rolling   Walk 50 feet activity   Assist Walk 50 feet with 2 turns activity did not occur: Refused  Assist level: Minimal Assistance - Patient > 75% Assistive device: Walker-rolling    Walk 150 feet activity  Assist Walk 150 feet activity did not occur: Refused         Walk 10 feet on uneven surface  activity   Assist Walk 10 feet on uneven surfaces activity did not occur: Refused (due to 10/10 pain and fatigue)         Wheelchair     Assist Will patient use wheelchair at discharge?: Yes Type of Wheelchair: Manual Wheelchair activity did not occur: Refused (due to 10/10 pain and fatigue)  Wheelchair assist level:  Set up assist Max wheelchair distance: 150 ft    Wheelchair 50 feet with 2 turns activity    Assist    Wheelchair 50 feet with 2 turns activity did not occur: Refused (due to 10/10 pain and fatigue)   Assist Level: Set up assist   Wheelchair 150 feet activity     Assist Wheelchair 150 feet activity did not occur: Refused (due to 10/10 pain and fatigue)   Assist Level: Set up assist     Medical Problem List and Plan: 1. 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     -goals: increase mobility, aggressive pain mgt, glucose control, positive pt outlook 2.  Antithrombotics: -DVT/anticoagulation:  Pharmaceutical: Continue Lovenox given limited ambulation.             -antiplatelet therapy: N/A 3. Chronic neck/back/Pain Management: Continue Methadone 20 mg tid, Pamelor 25 mg/hs and Nuycnta prn. Tizanidine tid prn for spasms.    8/4: increased gabapentin to 600mg  tid- increased sedation reduce to 400mg  TID   -increased nucynta to q6 prn    -dc'ed lyrica Less sedated today but more LE pain will resume gabapentin 600mg  TID  8/6 pain can wax and wan   -discussed massage, sensory stimulation of RLE   -asked neuropsych to follow up re: relaxation/pain mgt techniques   -continue to work on leisure/therapy activities    -provide positive reinforcement    4.  Mood: LCSW to follow for evaluation ad support.              -antipsychotic agents: N/A 5. Neuropsych: This patient is capable of making decisions on her own behalf. 6. Skin/Wound Care: Routine pressure relief measures.  7. Fluids/Electrolytes/Nutrition: Monitor I/O.   8/3: Hypokalemia- supplemented with K+ and repeat level 3.7   -continue daily dosing.  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  9.T2DM with hyperglycemia-poorly controlled: A1c- 8.6.               CBG (last 3)  Recent Labs    03/18/20 1631 03/18/20 2108 03/19/20 0604  GLUCAP 141* 102* 127*   8/4 better control since Increase of levemir to 18u bid 8/3  -continue 8u novolog with meals.  8/6 sugars leveling off now that steroids d/c'ed.   -levemir decreased to 16u bid 8/5  Will reduce to 14 U 8/7- am CBG is ok on 8/8 10. Normocytic anemia: Stable overall. Monitor for signs of infection.              Hemoglobin 10.1 on 7/24, 10.2 7/26 stable  CBC monday 11. Low calorie malnutrition: Continue supplement 12. Hyponatremia/ chronic Hypomagnesemia:   Sodium 137 on 8/3  Mag normal 1.8 on 7/26 13. Abnormal LFTs: Resolved 14.  Drug-induced constipation: Dulcolax 10mg  daily effective.   Improving  8/6  pt had BM  X 2  Increase bowel meds as necessary 15. Persistent daily HA: On topamax 100 mg/day.   Monitor with increased activity 16.  Thrombocytopenia  Platelets 146 on 7/24, 172 on 7/26  -CBC monday  17. Insomnia: Melatonin 5mg  HS   LOS: 16 days A FACE TO FACE EVALUATION WAS PERFORMED  03/19/2020, 8:49 AM

## 2020-03-19 NOTE — Progress Notes (Signed)
Occupational Therapy Session Note  Patient Details  Name: Monica [name] MRN: [id number] Date of Birth: Jul 08, 1951  Today's Date: 03/19/2020 OT Individual Time: 1300-1345 OT Individual Time Calculation (min): 45 min   Skilled Therapeutic Interventions/Progress Updates:    Pt greeted in bed and requested to use the restroom. Supine<sit completed with increased time with HOB elevated using the bedrails. After OT donned her shoes, she completed a squat pivot<w/c with CGA for stabilizing the Rt knee. She described a particular way that she completes toileting with staff and was adamant that she wanted to participate in toileting using these methods during session. Sit<stand using the grab bar in bathroom completed with CGA for supporting Rt knee, OT then assisted pt with clothing mgt before she pivotted over to the elevated toilet with Mod A. Pt with continent bladder void. She completed hygiene while seated and then OT elevated pants after she stood using the grab bar with Mod A. Min-Mod A for stand pivot<w/c. She then engaged in hand washing, UB bathing/dressing, oral care, and hair combing while sitting at the sink with setup assistance and increased time. Encouraged her to try several of these tasks in standing to work on her standing balance/Rt knee control however pt declined due to preferring to complete these tasks in a specific way while seated. At end of session pt remained sitting up in the w/c, call bell on bed. Tx focus placed on OOB tolerance, ADL retraining, functional transfers, and sit<stands.   Therapy Documentation Precautions:  Precautions Precautions: Fall Precaution Comments: weak and unsteady RLE Restrictions Weight Bearing Restrictions: No Vital Signs: Therapy Vitals Temp: 99 F (37.2 C) Temp Source: Oral Pulse Rate: 74 Resp: 16 BP: (!) 95/56 Patient Position (if appropriate): Lying Oxygen Therapy SpO2: 94 % O2 Device: Room Air Pain: Pain Assessment Pain Scale:  0-10 Pain Score: 2  Pain Type: Chronic pain Pain Location: Knee Pain Orientation: Right;Left Pain Radiating Towards: right flank Pain Descriptors / Indicators: Aching Pain Frequency: Constant Pain Onset: On-going Patients Stated Pain Goal: 2 Pain Intervention(s): Medication (See eMAR) ADL:       Therapy/Group: Individual Therapy  Llewellyn Choplin A Jaedyn Lard 03/19/2020, 3:52 PM

## 2020-03-19 NOTE — Progress Notes (Signed)
Physical Therapy Session Note  Patient Details  Name: Monica [name] MRN: [id number] Date of Birth: Jun 05, 1951  Today's Date: 03/19/2020 PT Individual Time: 6440-3474 PT Individual Time Calculation (min): 32 min   Short Term Goals: Week 3:  PT Short Term Goal 1 (Week 3): STG = LTG due to anticipated d/c date.  Skilled Therapeutic Interventions/Progress Updates:     Patient in bed asleep upon PT arrival. Patient easily aroused to verbal stimulation and agreeable to PT session. Patient reported 9/10 R LE and trunk pain during session, RN made aware. PT provided repositioning, rest breaks, and distraction as pain interventions throughout session.   Therapeutic Activity: Bed Mobility: Patient performed supine to/from sit with supervision with HOB elevated and use of bed rail per patient's request for pain managment. Discussed shoe selection with patient, as there are many in the room. Patient selected slip on tennis shoes without AFOs this session. PT educated on purpose and ability to move the AFOs to her larger tennis shoe, however patient insisted on waiting for alterations from orthotist before using AFOs again.  Transfers: Patient performed lateral scoot transfers bed<>w/c with CGA-close supervision. Provided cues for hand placement and head-hips relationship for proper technique and decreased assist with transfers. She performed sit to/from stand x1 and stand pivot x1 with min A using RW. Provided verbal cues for sequencing of pivot and assist for balance and management of RW. Patient required increased assist to sit after gait training, see below, due to R LE fatigue and poor attention to cues due to fear of falling. Patient became tearful after reporting significant fear of falling and history of multiple falls. Also states that he falls have put additional strain on her husband due to her increased caregiver burden with mobility. Reports that she is talking to the Neuropsychologist about this  during their sessions. PT provided emotional support and education on strategies to prevent falls and increase confidence with mobility. Patient in better spirits after discussion.   Gait Training:  Patient ambulated 15 feet using RW with min progressing to mod A due to R LE fatigue/buckling. Ambulated with step-to gait pattern leading with R, forward trunk flexion, mild posterior bias, and variable R foot placement. Provided verbal cues for stepping between hands and not close to the front of the RW to reduce posterior bias, looking ahead, and increased R BOS and quad activation in stance.  Wheelchair Mobility:  Patient was transported in the w/c with total A throughout session for energy conservation and time management.  Patient in bed at end of session with breaks locked, bed alarm set, and [name] needs within reach.    Therapy Documentation Precautions:  Precautions Precautions: Fall Precaution Comments: weak and unsteady RLE Restrictions Weight Bearing Restrictions: No    Therapy/Group: Individual Therapy  Adasyn Mcadams L Cristen Bredeson PT, DPT  03/19/2020, 4:52 PM

## 2020-03-20 ENCOUNTER — Inpatient Hospital Stay (HOSPITAL_COMMUNITY): Payer: Medicare Other | Admitting: Physical Therapy

## 2020-03-20 ENCOUNTER — Encounter (HOSPITAL_COMMUNITY): Payer: Medicare Other | Admitting: Occupational Therapy

## 2020-03-20 ENCOUNTER — Ambulatory Visit (HOSPITAL_COMMUNITY): Payer: Medicare Other | Admitting: Physical Therapy

## 2020-03-20 DIAGNOSIS — G610 Guillain-Barre syndrome: Secondary | ICD-10-CM | POA: Diagnosis not present

## 2020-03-20 DIAGNOSIS — R519 Headache, unspecified: Secondary | ICD-10-CM | POA: Diagnosis not present

## 2020-03-20 DIAGNOSIS — L89610 Pressure ulcer of right heel, unstageable: Secondary | ICD-10-CM | POA: Insufficient documentation

## 2020-03-20 DIAGNOSIS — E1165 Type 2 diabetes mellitus with hyperglycemia: Secondary | ICD-10-CM | POA: Diagnosis not present

## 2020-03-20 LAB — BASIC METABOLIC PANEL
Anion gap: 9 (ref 5–15)
BUN: 14 mg/dL (ref 8–23)
CO2: 30 mmol/L (ref 22–32)
Calcium: 8.9 mg/dL (ref 8.9–10.3)
Chloride: 98 mmol/L (ref 98–111)
Creatinine, Ser: 0.94 mg/dL (ref 0.44–1.00)
GFR calc Af Amer: >60 mL/min (ref >60)
GFR calc non Af Amer: >60 mL/min (ref >60)
Glucose, Bld: 140 mg/dL — ABNORMAL HIGH (ref 70–99)
Potassium: 4 mmol/L (ref 3.5–5.1)
Sodium: 137 mmol/L (ref 135–145)

## 2020-03-20 LAB — CBC
HCT: 31.9 % — ABNORMAL LOW (ref 36.0–46.0)
Hemoglobin: 9.9 g/dL — ABNORMAL LOW (ref 12.0–15.0)
MCH: 28.8 pg (ref 26.0–34.0)
MCHC: 31 g/dL (ref 30.0–36.0)
MCV: 92.7 fL (ref 80.0–100.0)
Platelets: 231 10*3/uL (ref 150–400)
RBC: 3.44 MIL/uL — ABNORMAL LOW (ref 3.87–5.11)
RDW: 13.6 % (ref 11.5–15.5)
WBC: 5.8 10*3/uL (ref 4.0–10.5)
nRBC: 0 % (ref 0.0–0.2)

## 2020-03-20 LAB — GLUCOSE, CAPILLARY
Glucose-Capillary: 129 mg/dL — ABNORMAL HIGH (ref 70–99)
Glucose-Capillary: 133 mg/dL — ABNORMAL HIGH (ref 70–99)
Glucose-Capillary: 124 mg/dL — ABNORMAL HIGH (ref 70–99)
Glucose-Capillary: 113 mg/dL — ABNORMAL HIGH (ref 70–99)

## 2020-03-20 MED ORDER — NORTRIPTYLINE HCL 10 MG PO CAPS
10.0000 mg | ORAL_CAPSULE | Freq: Every day | ORAL | Status: DC
  Administered 2020-03-20 (×2): 10 mg via ORAL
  Filled 2020-03-20 (×2): qty 1

## 2020-03-20 MED ORDER — GABAPENTIN 300 MG PO CAPS
600.0000 mg | ORAL_CAPSULE | Freq: Four times a day (QID) | ORAL | Status: DC
  Administered 2020-03-20 – 2020-03-24 (×15): 600 mg via ORAL
  Filled 2020-03-20 (×15): qty 2

## 2020-03-20 NOTE — Telephone Encounter (Signed)
I called Monica [name] w/ [facility] asking for call back.

## 2020-03-20 NOTE — Progress Notes (Addendum)
[facility] PHYSICAL MEDICINE & REHABILITATION PROGRESS NOTE  Subjective/Complaints:  Had a better night and morning. Increased and more frequent gabapentin helping right leg pain as well as headache. Today's her anniversary!  ROS: Patient denies fever, rash, sore throat, blurred vision, nausea, vomiting, diarrhea, cough, shortness of breath or chest pain,   or mood change.    Objective: Vital Signs: Blood pressure 120/60, pulse 79, temperature 98.3 F (36.8 C), temperature source Oral, resp. rate 16, height 5\' 4"  (1.626 m), weight 60.7 kg, SpO2 97 %. No results found. Recent Labs    03/20/20 0435  WBC 5.8  HGB 9.9*  HCT 31.9*  PLT 231   Recent Labs    03/20/20 0435  NA 137  K 4.0  CL 98  CO2 30  GLUCOSE 140*  BUN 14  CREATININE 0.94  CALCIUM 8.9    Physical Exam: BP 120/60 (BP Location: Left Arm)   Pulse 79   Temp 98.3 F (36.8 C) (Oral)   Resp 16   Ht 5\' 4"  (1.626 m)   Wt 60.7 kg   SpO2 97%   BMI 22.97 kg/m    Constitutional: No distress . Vital signs reviewed. HEENT: EOMI, oral membranes moist Neck: supple Cardiovascular: RRR without murmur. No JVD    Respiratory/Chest: CTA Bilaterally without wheezes or rales. Normal effort    GI/Abdomen: BS +, non-tender, non-distended Ext: no clubbing, cyanosis, or edema Psych: pleasant and cooperative  Motor: Right lower extremity: Hip flexion, knee extension tr-1/5, ankle dorsiflexion 2-/5. Right leg appears much less sensitive to touch Left lower extremity: Hip flexion, knee extension 4+/5, 0-tr/5 ankle DF, 2 PF. Decreased sensation in BLE, right more than left with sensory loss up to mid trunk.--motor/sensory stable today 8/9.  Skin: incisions cdi RLE, buttocks wound with foam dressing  Assessment/Plan: 1. 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  Physiatrist is providing close team supervision and  24 hour management of active medical problems listed below.  Physiatrist and rehab team continue to assess barriers to discharge/monitor patient progress toward functional and medical goals  Care Tool:  Bathing    Body parts bathed by patient: Right arm, Left arm, Chest, Abdomen, Face, Right upper leg, Left upper leg, Front perineal area, Buttocks, Left lower leg   Body parts bathed by helper: Right lower leg     Bathing assist Assist Level: Minimal Assistance - Patient > 75%     Upper Body Dressing/Undressing Upper body dressing   What is the patient wearing?: Pull over shirt    Upper body assist Assist Level: Supervision/Verbal cueing    Lower Body Dressing/Undressing Lower body dressing      What is the patient wearing?: Pants     Lower body assist Assist for lower body dressing: Maximal Assistance - Patient 25 - 49%     Toileting Toileting    Toileting assist Assist for toileting: Maximal Assistance - Patient 25 - 49%     Transfers Chair/bed transfer  Transfers assist  Chair/bed transfer activity did not occur: Refused (due to 10/10 pain and fatigue)  Chair/bed transfer assist level: Minimal Assistance - Patient > 75%     Locomotion Ambulation   Ambulation assist   Ambulation activity did not occur: Refused (due to 10/10 pain and fatigue)  Assist level: Minimal Assistance - Patient > 75% Assistive device: Walker-rolling Max distance: 53 ft   Walk 10 feet activity   Assist  Walk 10 feet activity did not occur: Refused (due to 10/10 pain and fatigue)  Assist level: Minimal Assistance - Patient > 75% Assistive device: Walker-rolling   Walk 50 feet activity   Assist Walk 50 feet with 2 turns activity did not occur: Refused  Assist level: Minimal Assistance - Patient > 75% Assistive device: Walker-rolling    Walk 150 feet activity   Assist Walk 150 feet activity did not occur: Refused         Walk 10 feet on uneven surface   activity   Assist Walk 10 feet on uneven surfaces activity did not occur: Refused (due to 10/10 pain and fatigue)         Wheelchair     Assist Will patient use wheelchair at discharge?: Yes Type of Wheelchair: Manual Wheelchair activity did not occur: Refused (due to 10/10 pain and fatigue)  Wheelchair assist level: Set up assist Max wheelchair distance: 150 ft    Wheelchair 50 feet with 2 turns activity    Assist    Wheelchair 50 feet with 2 turns activity did not occur: Refused (due to 10/10 pain and fatigue)   Assist Level: Set up assist   Wheelchair 150 feet activity     Assist Wheelchair 150 feet activity did not occur: Refused (due to 10/10 pain and fatigue)   Assist Level: Set up assist     Medical Problem List and Plan: 1. 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   -goals of mgt: increase mobility, aggressive pain mgt, glucose control, positive pt outlook  -Mrs Camus need an 68" x 16" w/c for daily mobility and ADL's given her ongoing lower extremity weakness and sensory deficits 2.  Antithrombotics: -DVT/anticoagulation:  Pharmaceutical: Continue Lovenox given limited ambulation.             -antiplatelet therapy: N/A 3. Chronic neck/back/Pain Management: Continue Methadone 20 mg tid, Pamelor 25 mg/hs and Nuycnta prn. Tizanidine tid prn for spasms.    8/10: pt feeling better with gabapentin 600mg  q6    -may use nucynta for breakthrough pain   -continue massage, sensory stimulation of RLE   - neuropsych to follow up re: relaxation/pain mgt techniques   -continue to work on leisure/therapy activities    -provide daily positive reinforcement    4.  Mood: LCSW to follow for evaluation ad support.              -antipsychotic agents: N/A 5. Neuropsych:  This patient is capable of making decisions on her own behalf. 6. Skin/Wound Care: Routine pressure relief measures.  7. Fluids/Electrolytes/Nutrition: Monitor I/O.   8/9: Hypokalemia- resolved at 4.0   -continue daily dosing for now.     9.T2DM with hyperglycemia-poorly controlled: A1c- 8.6.               CBG (last 3)  Recent Labs    03/19/20 2108 03/20/20 0550 03/20/20 1127  GLUCAP 129* 129* 133*   8/10 continue levemir 14u bid---improved control, sugars more consistent 10. Normocytic anemia: Stable overall. Monitor for signs of infection.              Hemoglobin 9.9 8/9 11. Low calorie malnutrition: Continue supplement 12. Hyponatremia/ chronic Hypomagnesemia:   Sodium 137 on 8/9  Mag normal 1.8 on 7/26 13. Abnormal LFTs: Resolved 14.  Drug-induced constipation:  Dulcolax 10mg  daily effective.   Improving  8/10 bm    15. Persistent daily HA: On topamax 100 mg/day.   addn of gabapentin above has helped headaches also 16.  Thrombocytopenia  Platelets 231 8/9 17. Insomnia: Melatonin 5mg  HS   LOS: 17 days A FACE TO FACE EVALUATION WAS PERFORMED  10/9 03/20/2020, 2:25 PM

## 2020-03-20 NOTE — Progress Notes (Signed)
Occupational Therapy Session Note  Patient Details  Name: Monica [name] MRN: [id number] Date of Birth: Feb 05, 1951  Today's Date: 03/20/2020 OT Individual Time: 1300-1400 OT Individual Time Calculation (min): 60 min    Short Term Goals: Week 2:  OT Short Term Goal 1 (Week 2): LTG=STG 2/2 ELOS  Skilled Therapeutic Interventions/Progress Updates:    Pt greeted semi-reclined in bed finishing lunch and agreeable to OT treatment session focused on pt/family education. Pt completed bed mobility with supervision. Did not don AFOs today as [employer] rep is to come and place toe caps on new shoes. Worked on donning slip on shoes at EOB with min A. OT then had pt's spouse practice stand-pivot transfer using gait belt for support. Pt needed mod A for sit<>stand with RW with B LE knee buckling. Pt was then able to pivot over to wc with spouse providing min/mod A with gait belt. Pt brought down to therapy apartment and discussed home bathroom set-up. Set-up walk-in shower in simulated home environment. Given large shower ledge, OT did not recommend pt stepping over ledge to shower and tub transfer bench will not fit through shower door. Practiced slideboard transfer with shower chair and dysom to keep board from slipping. Pt able to complete transfer with CGA/set-up A. Pt's spouse to order shower chair for home use. Pt returned to room and practiced slideborad transfers in and out of bed to both sides of wheelchair. Discussed dc plan for [facility] therapies, then progressing to outpatient. Pt and spouse in aggreeance. Pt left seated in wc awaiting next PT session.   Therapy Documentation Precautions:  Precautions Precautions: Fall Precaution Comments: weak and unsteady RLE Restrictions Weight Bearing Restrictions: No Pain:  denies pain  Therapy/Group: Individual Therapy  Mal Amabile 03/20/2020, 2:18 PM

## 2020-03-20 NOTE — Progress Notes (Signed)
Physical Therapy Session Note  Patient Details  Name: Monica [name] MRN: [id number] Date of Birth: 1950-08-28  Today's Date: 03/20/2020 PT Individual Time: [phone] and 2025-4270 PT Individual Time Calculation (min): 85 min and 56 min   Short Term Goals: Week 3:  PT Short Term Goal 1 (Week 3): STG = LTG due to anticipated d/c date.  Skilled Therapeutic Interventions/Progress Updates:  Treatment 1: Pt received in bed, reporting she had a bad night with PT distracting pt from becoming emotional. Supine>sit with supervision & hospital bed features. Pt with new, larger tennis shoes in room so PT switched AFO's to new shoes & contacted [employer] Science writer) requesting he apply toe caps to new shoes. PT donned tennis shoes & AFO's with pt reporting she doesn't feel R heel is completely flat in shoe but PT unable to correct despite attempts (educated pt on need to use PRAFOs every night to maintain ROM in ankles). Sit>stand with min assist with pt pushing back strongly on bed, stand pivot to w/c with RW & min assist. Pt utilizes kinetron in sitting for BLE strengthening. Pt propels w/c ortho gym>dayroom, dayroom>room with BUE & set up assist. Car transfer at 26" to simulate actual car, with first attempt via ambulation with RW & min assist, 2nd attempt with slide board with min assist with PT reviewing head/hips relationship & scooting across board. Completed car transfer with both AD's in preparation for real car transfer with husband this afternoon & due to pt's fluctuating level of mobility. Gait x 53 ft with RW & min assist with pt unable to fully extend R knee in standing/gait with decreased step length & width BLE with PT providing cuing for increased step width & pt demonstrating decreased gait speed. After gait pt with BUE tremors (R>L) with pt reporting they have occurred before & MD made aware & reports this is to be expected. At end of session pt left in w/c with chair alarm donned & call bell,  all needs in reach.  Pain: no formal c/o pain, but pt states her legs were hurting earlier & feel better after having her morning medicine   Treatment 2: Pt received in w/c with husband Monica [name]) present for caregiver education. Pt reports she's got "the twisties" in LE but does not elaborate & requests pain medication - nurse administers meds & applies pain patches for pain management. PT dons new tennis shoes & AFOs total assist for time management with PT able to place R heel completely flat in shoe Thayer Ohm plans to place toe caps on new shoes this afternoon). Transported pt unit<>outside via w/c dependent assist to practice car transfer. Pt completes ambulatory car transfer with PT with min assist overall. Pt then completes transfer in same manner with husband's assistance & when transferring out of car reports "my legs are going" & requires total assist to safely place w/c behind her to sit for safety. Educated pt & husband on need to practice with RW and with slide board to prepare for "bad" days when pt is feeling weak & pt agreeable to this. Monica [name] reports he's already purchased a slide board & an electric scooter because manual w/c will not fit into bathroom. PT educated pt & Monica [name] on need for pt to remain as physically active as safely as possible (I.e. use manual w/c for mobility if unable to safely ambulate with RW) to maintain CLOF. Monica [name] reports manual w/c will not fit into bathroom so discussed possibility of pt ambulating into bathroom  or using BSC.   Pt can currently ambulate with min assist as noted from morning session, but when fatigued is unable to safely ambulate with RW & assistance so will benefit from practicing slide board transfers with PT & family prior to d/c.  Monica [name] plans to return tomorrow for further caregiver training. Pt upset at end of session 2/2 BLE weakness & PT provides encouragement. Pt left in w/c in room with chair alarm donned, call bell in reach.   Therapy  Documentation Precautions:  Precautions Precautions: Fall Precaution Comments: weak and unsteady RLE Restrictions Weight Bearing Restrictions: No    Therapy/Group: Individual Therapy  Sandi Mariscal 03/20/2020, 3:54 PM

## 2020-03-20 NOTE — Telephone Encounter (Signed)
I spoke with Dr Lucia Gaskins this AM who agrees that IVIG is no longer indicated. I will call Porfirio Mylar today to let her know and ask for IVIG discontinuation orders.

## 2020-03-21 ENCOUNTER — Inpatient Hospital Stay (HOSPITAL_COMMUNITY): Payer: Medicare Other | Admitting: Occupational Therapy

## 2020-03-21 ENCOUNTER — Inpatient Hospital Stay (HOSPITAL_COMMUNITY): Payer: Medicare Other | Admitting: Physical Therapy

## 2020-03-21 DIAGNOSIS — E1165 Type 2 diabetes mellitus with hyperglycemia: Secondary | ICD-10-CM | POA: Diagnosis not present

## 2020-03-21 DIAGNOSIS — R519 Headache, unspecified: Secondary | ICD-10-CM | POA: Diagnosis not present

## 2020-03-21 LAB — GLUCOSE, CAPILLARY
Glucose-Capillary: 131 mg/dL — ABNORMAL HIGH (ref 70–99)
Glucose-Capillary: 187 mg/dL — ABNORMAL HIGH (ref 70–99)
Glucose-Capillary: 166 mg/dL — ABNORMAL HIGH (ref 70–99)
Glucose-Capillary: 132 mg/dL — ABNORMAL HIGH (ref 70–99)

## 2020-03-21 NOTE — Progress Notes (Signed)
Patient ID: SUJEY GUNDRY, female   DOB: [birth year], 69 y.o.   MRN: [id number]  SW faxed DME order: w/c to [facility] ([phone]/[phone]).   SW met with pt in room to inform on d/c date changed to 8/13. Pt states that the ramp will not be built until Saturday. SW discussed with pt if she can stay downstairs, or if her husband is amenable to bumping her up the stairs. She encouraged SW to discussed with her husband further, but did not think it would be an issue. SW also to discuss HHA preference with husband.  Loralee Pacas, MSW, [place] Office: [phone] Cell: [phone] Fax: [phone]

## 2020-03-21 NOTE — Telephone Encounter (Signed)
[facility] will likely send d/c orders for Dr. Lucia Gaskins to sign.

## 2020-03-21 NOTE — Progress Notes (Signed)
2Occupational Therapy Session Note  Patient Details  Name: Monica [name] MRN: [id number] Date of Birth: [birth year]  Today's Date: 03/21/2020  Session 1 OT Individual Time: 8101-7510 OT Individual Time Calculation (min): 57 min   Session 2 OT Individual Time: 1303-1400 OT Individual Time Calculation (min): 57 min   Short Term Goals: Week 2:  OT Short Term Goal 1 (Week 2): LTG=STG 2/2 ELOS  Skilled Therapeutic Interventions/Progress Updates:  Session 1    Pt greeted sitting in wc and agreeable to shower today. Pt doffed shoes from wc level using figure 4 position and supervision. OT assist to propel wc over ledge into bathroom. Pt doffed clothing using lateral leans for LB with supervision. Pt then completed lateral scoot from wc onto tub bench with min A. Bathing completed using lateral leans to wash buttocks and overall CGA. Mod A to transfer out of shower 2/2 wc seat being higher than bench. Dressing completed from wc at the sink with min A to stand + R knee block to maintain balance in standing to pull up pants. Pt left seated in wc at end of session at the sink drying and curling hair.  Session 2  Pt greeted semi-reclined in bed after finishing lunch. Case manager also finishing conversation with patient. Patient agreeable to OT makeup session. Pt completed bed mobility with supervision. Pt donned regular shoes using shoe horn and min A from OT. Lateral scoot transfer using slideboard and CGA. Pt brought down to therapy gym and worked on sit<>stands using RW. Pt needed min A to stand with facilitation for anterior weight shift. Worked on taking alternating UEs off of RW to depend more on LEs and simulate pulling pants up. Pt completed 10 sit<>stands in all with min A and min  A for balance. Pt returned to room and left seated in wc with alarm blt on and nursing entering room.    Therapy Documentation Precautions:  Precautions Precautions: Fall Precaution Comments: weak and unsteady  RLE Restrictions Weight Bearing Restrictions: No Pain:     Therapy/Group: Individual Therapy  Mal Amabile 03/21/2020, 2:00 PM

## 2020-03-21 NOTE — Patient Care Conference (Signed)
Inpatient RehabilitationTeam Conference and Plan of Care Update Date: 03/21/2020   Time: 10:47 AM    Patient Name: Monica [name]      Medical Record Number: [id number]  Date of Birth: October 18, 1950 Sex: Female         Room/Bed: 4W25C/4W25C-01 Payor Info: Payor: MEDICARE / Plan: MEDICARE PART A AND B / Product Type: *No Product type* /    Admit Date/Time:  03/03/2020  2:23 PM  Primary Diagnosis:  Diabetic amyotrophy [facility])  Hospital Problems: Principal Problem:   Diabetic amyotrophy (HCC) Active Problems:   Thrombocytopenia (HCC)   Persistent headaches   Drug induced constipation   Uncontrolled type 2 diabetes mellitus with hyperglycemia (HCC)   Pressure injury of skin    Expected Discharge Date: Expected Discharge Date: 03/24/20  Team Members Present: Physician leading conference: Dr. Faith Rogue Care Coodinator Present: Cecile Sheerer, LCSWA;Shadow Stiggers Marlyne Beards, RN, BSN, CRRN Nurse Present: Other (comment) Dava Najjar McGloster, RN) PT Present: Aleda Grana, PT OT Present: Kearney Hard, OT PPS Coordinator present : Edson Snowball, Park Breed, SLP     Current Status/Progress Goal Weekly Team Focus  Bowel/Bladder   Continent  Pt will remain continent      Swallow/Nutrition/ Hydration             ADL's   Min A ADLs overall, mod A at times for sit<>stands pending fatigue and LE weakness  Min A  pain management, self-care retraining, sit<>stands, pt/family education, activity tolerance   Mobility   supervision<>mod assist bed mobility, min assist sit<>stand, min<>max assist car transfer, fluctuating levels of assist 2/2 weakness  downgraded to min assist overall, short distance gait, d/c stairs goal  activity tolerance, pt education, transfers, gait, w/c mobility, strengthening, balance, d/c planning   Communication             Safety/Cognition/ Behavioral Observations            Pain   Pt c/o leg pains.  Pt's pain will be contrlled by pharmacologic  and non  pharmacological means. Pt will inform staff as soon as possible when in pain.  Assess for pain q shift and as needed   Skin   Skin breakdown in lower extremeties  Breakdown will be healed and further breakdown will be prevented.  Assess for healing and further brteakdown q shift and as needed.     Discharge Planning:  Discharge to home with spouse, spouse to provide care at dischare.   Team Discussion: Continent of B/B. Still continuing to manage pain. MD request to use PRN Gabapentin. Blood sugars better controlled. Working on transfers with slideboard. BLE will tend to buckle.  Patient on target to meet rehab goals: yes  *See Care Plan and progress notes for long and short-term goals.   Revisions to Treatment Plan:  MD making adjustments to medications.  Teaching Needs: Family education as recommended by therapy. Teach transfers using slideboard to husband.  Current Barriers to Discharge: Lack of/limited family support and BLE buckling.  Possible Resolutions to Barriers: Continue family education and training. MD possibly adding BLE orthotics to aid in stability.     Medical Summary Current Status: diabetic amyotrophy, improved pain control. ongoing R>L weakness. improved glucose control. no IVIG give diagnosis  Barriers to Discharge: Medical stability   Possible Resolutions to Barriers/Weekly Focus: improved glucose control, finalize pain mgt plan for home. orthotics BLE   Continued Need for Acute Rehabilitation Level of Care: The patient requires daily medical management by a physician with specialized training in  physical medicine and rehabilitation for the following reasons: Direction of a multidisciplinary physical rehabilitation program to maximize functional independence : Yes Medical management of patient stability for increased activity during participation in an intensive rehabilitation regime.: Yes Analysis of laboratory values and/or radiology reports with any  subsequent need for medication adjustment and/or medical intervention. : Yes   I attest that I was present, lead the team conference, and concur with the assessment and plan of the team.   Tennis Must 03/21/2020, 4:01 PM

## 2020-03-21 NOTE — Telephone Encounter (Signed)
Monica [name] w/ [facility] returned my call. I gave her v.o. per Dr. Lucia Gaskins to cancel IVIG d/t no longer indicated, looking into another diagnosis. She verbalized understanding.

## 2020-03-21 NOTE — Progress Notes (Signed)
Physical Therapy Discharge Summary  Patient Details  Name: NIHIRA PUELLO MRN: [id number] Date of Birth: [birth year]  Today's Date: 03/22/2020   Patient has met 9 of 9 long term goals due to improved activity tolerance, improved balance, improved postural control, increased strength, decreased pain and ability to compensate for deficits.  Patient to discharge at transfer level with  Min assist with RW, CGA with slide board, min assist gait with RW, and set up assist for w/c mobilty.  Patient's care partner is independent to provide the necessary physical and cognitive assistance at discharge.  Reasons goals not met:  Recommendation:  Patient will benefit from ongoing skilled PT services in home health setting to continue to advance safe functional mobility, address ongoing impairments in strength, balance, transfers, gait, stairs as able, endurance, and minimize fall risk.  Equipment: 18x16 w/c, B AFO's (toe caps applied to shoes) - husband has already purchased slide board  Reasons for discharge: treatment goals met and discharge from hospital  Patient/family agrees with progress made and goals achieved: Yes  PT Discharge Precautions/Restrictions Precautions Precautions: Fall Restrictions Weight Bearing Restrictions: No  Vision/Perception No visual deficits & no changes from baseline. Perception & praxis intact.    Cognition Overall Cognitive Status: Within Functional Limits for tasks assessed Arousal/Alertness: Awake/alert Orientation Level: Oriented X4 Memory: Appears intact Awareness: Appears intact Problem Solving: Appears intact Safety/Judgment: Appears intact  Sensation Sensation Light Touch: Appears Intact (when BLE tested to light touch during CIR stay) Proprioception: Impaired by gross assessment Coordination Gross Motor Movements are Fluid and Coordinated: No (2/2 significant BLE weakness & impaired neuromuscular control) Fine Motor Movements are Fluid and  Coordinated: Yes (in BUE)  Motor  Motor Motor: Abnormal postural alignment and control Motor - Discharge Observations: generalized deconditioning/weakness, significant BLE weakness, tremors have been noted during CIR stay (BUE, R>L)   Mobility Bed Mobility Bed Mobility: Supine to Sit;Sit to Supine Supine to Sit: Supervision/Verbal cueing (hospital bed features, using BUE to assist BLE PRN) Sit to Supine: Moderate Assistance - Patient 50-74% (hospital bed features, mod assist to elevate BLE onto bed) Transfers Transfers: Stand to Sit;Sit to [facility];Lateral/Scoot Transfers Sit to Stand: Minimal Assistance - Patient > 75% (intermittent R knee medially collapses, strong push back on surface with backs of legs) Stand to Sit: Minimal Assistance - Patient > 75% Stand Pivot Transfers: Minimal Assistance - Patient > 75% (with RW) Lateral/Scoot Transfers: Contact Guard/Touching assist (with slide board)  Locomotion  Gait Ambulation: Yes Gait Assistance: Minimal Assistance - Patient > 75% Gait Distance (Feet): 53 Feet Assistive device: Rolling walker (B AFOs, toe caps) Gait Assistance Details: Verbal cues for technique Gait Assistance Details: cuing for upright posture, increased step width BLE Gait Gait: Yes Gait Pattern: Decreased step length - right;Decreased step length - left;Decreased stride length;Decreased hip/knee flexion - left;Decreased dorsiflexion - right;Decreased dorsiflexion - left;Decreased hip/knee flexion - right;Narrow base of support;Poor foot clearance - right;Poor foot clearance - left (forward lean & heavy reliance on RW) Gait velocity: decreased Stairs / Additional Locomotion Stairs: No (do not recommend 2/2 safety concerns due to BLE weakness & inconsistent performance) Wheelchair Mobility Wheelchair Mobility: Yes Wheelchair Assistance: Set up Lexicographer: Both upper extremities Wheelchair Parts Management: Needs  assistance Distance: 150 ft   Trunk/Postural Assessment  Cervical Assessment Cervical Assessment: Exceptions to [facility] (forward head) Thoracic Assessment Thoracic Assessment: Exceptions to [facility] (rounded shoulders) Lumbar Assessment Lumbar Assessment: Exceptions to [facility] (posterior pelvic tilt) Postural Control Postural Control: Deficits on evaluation Righting  Reactions: delayed Protective Responses: delayed   Balance Balance Balance Assessed: Yes Static Sitting Balance Static Sitting - Balance Support: Feet supported Static Sitting - Level of Assistance: 6: Modified independent (Device/Increase time) Dynamic Sitting Balance Dynamic Sitting - Balance Support: Feet supported Dynamic Sitting - Level of Assistance: 5: Stand by assistance Static Standing Balance Static Standing - Balance Support: Bilateral upper extremity supported;During functional activity Static Standing - Level of Assistance: 4: Min assist  Extremity Assessment  RUE Assessment RUE Assessment: Within Functional Limits LUE Assessment LUE Assessment: Within Functional Limits  RLE Assessment RLE Assessment: Exceptions to [facility] Passive Range of Motion (PROM) Comments: able to achieve neutral with PROM, but very tight heel cords (pt needs PRAFOs at night) Active Range of Motion (AROM) Comments: BLE foot drop General Strength Comments: significant BLE weakness (0/5 dorsiflexion, 2/5 knee extension & hip flexion) LLE Assessment LLE Assessment: Exceptions to [facility] Passive Range of Motion (PROM) Comments: able to achieve neutral with PROM, but very tight heel cords (pt needs PRAFOs at night) Active Range of Motion (AROM) Comments: BLE foot drop General Strength Comments: significant BLE weakness (0-1/5 dorsiflexion, 2/5 knee extension & hip flexion)   Lavone Nian, PT, DPT 03/22/2020, 7:53 AM Sebastian Ache, DPT  Waunita Schooner 03/22/2020, 7:53 AM

## 2020-03-21 NOTE — Progress Notes (Addendum)
Physical Therapy Session Note  Patient Details  Name: Monica [name] MRN: [id number] Date of Birth: 09/12/1950  Today's Date: 03/21/2020 PT Individual Time: 0805-0900 and 1419-1530 PT Individual Time Calculation (min): 55 min and 71 min  Short Term Goals: Week 3:  PT Short Term Goal 1 (Week 3): STG = LTG due to anticipated d/c date.  Skilled Therapeutic Interventions/Progress Updates:  Treatment 1: Pt received in w/c with nurse present & administering meds. Pt's shoes have not been returned with new toe caps yet so PT assisted pt with donning alternate tennis shoes. Pt propels w/c around unit with BUE & mod I for w/c propulsion. In apartment, pt completes slide board transfer w/c<>couch with focus on use of slide board for transferring & transferring to household surface. Pt requires assistance for slide board set up but CGA overall for transfer. Pt then completes w/c<>nu-step with slide board & CGA with ongoing cuing for head/hips relationship. Pt utilized nu-step on level 4 x 12 minutes with all four extremities for global strengthening & endurance training & coordination of reciprocal movements. Pt reports her husband canceled order of portable ramp 2/2 slow shipping times & plans to build a ramp this coming weekend. PT educated pt that her d/c date is before ramp is scheduled to be built & discussed home access. PT thoroughly educated pt that it is NOT recommended she attempt stair negotiation at home with husband & provided alternate options of 2 person assist to bump pt up steps in w/c or non emergent EMS transport home - PT to f/u with husband this afternoon. Also encouraged pt to use manual w/c vs electric scooter her husband has purchased, as much as possible, to maintain current physical mobility/strength. Back in room pt waters flowers from w/c level with mod I. Pt left in w/c with chair alarm donned, MD in room. Pain: 3/10 in L foot, R foot & R thigh - pt is premedicated, distracted during  session & rest breaks provided PRN  Treatment 2: Pt received in w/c with husband [name]) present for session. Pt shoes returned with toe caps applied & PT assisted pt with donning tennis shoes & B AFOs. Educated [name] on pt's inability to negotiate stairs & [name] reports he has someone that can help him bump pt up steps in w/c. PT educated [name] on how to do this & how to manage w/c, also provided him with handouts re: bumping up curb & steps in w/c & how to build a ramp. [name] reports he will build a ramp this coming weekend. Pt propels w/c around unit with BUE & set up assist. PT & pt completed slide board transfer w/c<>car at SUV simulated height with CGA with PT providing ongoing cuing re: head/hips relationship. Pt & [name] then return demonstrated car transfer. [name] voices concerns re: pt ambulating to the toilet with PT reinforcing conversation that OT had with pt & [name] yesterday re: pt's ability to stand pivot or slide board to toilet. Gait with pt & [name] assisting her with PT providing close w/c follow & CGA. Educated Kevin on pt's forward lean & heavy reliance on RW; pt is able to ambulate ~10 ft with RW after 2 attempts. After first attempt pt very tearful over inability to fully extend R knee in standing & appears to become perseverative on this & bothered by it. PT encourages pt to be in tune with her body but to not become perseverative on something as this significantly impairs her mobility. Back in  room [name] assists pt with stand pivot w/c>bed with RW with pt ultimately requiring significant assist from PT for RW management & sequencing. Pt also sits without reaching back for bed. Sit>supine with mod assist to elevate BLE onto bed. Pt left in bed with alarm set & call bell in reach, [place] in room. Pain: pt with c/o unrated RLE pain - pain patch applied & rest breaks provided PRN  Therapy Documentation Precautions:  Precautions Precautions: Fall Precaution Comments: weak and unsteady  RLE Restrictions Weight Bearing Restrictions: No    Therapy/Group: Individual Therapy  Sandi Mariscal 03/21/2020, 3:42 PM

## 2020-03-22 ENCOUNTER — Inpatient Hospital Stay (HOSPITAL_COMMUNITY): Payer: Medicare Other | Admitting: Physical Therapy

## 2020-03-22 ENCOUNTER — Inpatient Hospital Stay (HOSPITAL_COMMUNITY): Payer: Medicare Other

## 2020-03-22 DIAGNOSIS — E1144 Type 2 diabetes mellitus with diabetic amyotrophy: Secondary | ICD-10-CM | POA: Diagnosis not present

## 2020-03-22 DIAGNOSIS — E1165 Type 2 diabetes mellitus with hyperglycemia: Secondary | ICD-10-CM | POA: Diagnosis not present

## 2020-03-22 DIAGNOSIS — R519 Headache, unspecified: Secondary | ICD-10-CM | POA: Diagnosis not present

## 2020-03-22 LAB — GLUCOSE, CAPILLARY
Glucose-Capillary: 147 mg/dL — ABNORMAL HIGH (ref 70–99)
Glucose-Capillary: 166 mg/dL — ABNORMAL HIGH (ref 70–99)
Glucose-Capillary: 153 mg/dL — ABNORMAL HIGH (ref 70–99)
Glucose-Capillary: 223 mg/dL — ABNORMAL HIGH (ref 70–99)

## 2020-03-22 NOTE — Progress Notes (Addendum)
Physical Therapy Note  Patient Details  Name: Monica [name] MRN: [id number] Date of Birth: 10-19-1950 Today's Date: 03/22/2020    Recommending the following equipment to increase functional independence with mobility: ~ 18x16 manual w/c with standard leg rests - slide board (husband has already purchased this)      Sandi Mariscal 03/22/2020, 2:37 PM

## 2020-03-22 NOTE — Progress Notes (Signed)
Patient ID: Monica [name], female   DOB: 03-05-1951, 69 y.o.   MRN: [id number]  SW met with pt husband while he was on his way to therapy with pt. SW inquired if there were any follow-up from [place] about DME, and he has not received any follow-up. When discussing HHA preference, prefers [facility].   SW sent referral to [place] [phone]) and waiting on follow-up.  SW spoke with Lori/[facility] [phone]) to discuss DME, and reports the order will have to be sent in for Medicare approval which can take 7-10 business, and family has the option to private pay for rental at est. $51 per mos until there is an answer from Medicare. SW to discuss with pt and husband.  Referral accepted by [facility] as pt was previously established.   *SW spoke with pt husband Lennette Bihari [phone]) to discuss above about w/c. He is agreeable to paying rental rate until processed through insurance. SW also informed him on HHA approval.   Loralee Pacas, MSW, [place] Office: [phone] Cell: [phone] Fax: [phone]

## 2020-03-22 NOTE — Progress Notes (Signed)
Physical Therapy Session Note  Patient Details  Name: Monica [name] MRN: [id number] Date of Birth: 11/03/1950  Today's Date: 03/22/2020 PT Individual Time: 7616-0737 and 1062-6948 PT Individual Time Calculation (min): 60 min and 71 min  Short Term Goals: Week 3:  PT Short Term Goal 1 (Week 3): STG = LTG due to anticipated d/c date.  Skilled Therapeutic Interventions/Progress Updates:  Treatment 1: Pt received in w/c with PA in room with pt asking questions re: hospital bed. Discussed this with pt who has reported she has slept in a recliner for years but willing to get a hospital bed but pt declines 2/2 rental beds not being identical to ones in hospital. Pt propels w/c around unit with BUE & set up assist. Discussed family ed yesterday & pt willing to practice "anything & everything". Attempted car transfer with RW with pt transferring to standing but then noting something in her way & transitioning to standing with narrow BOS then attempting to move w/c leg rest out of her way since it was caught on RW with pt performing single leg stance RLE & attempting to move with with LLE with PT educating pt on need to place BLE on floor & PT adjusting it. Pt then proceeds to step pivot to car when she begins talking about her current tennis shoes, then her BUE on RW, then another pair of shoes with PT attempting to distract her from each topic. Pt then begins having difficulty maneuvering RW so pt assisted to sit in w/c for safety. PT educated pt on her physical ability to complete car transfer but limited as she is very internally distracted then appears to become fearful with mobility. Attempted car transfer a 2nd time but pt required assistance to safely sit in w/c as pt with difficulty maneuvering RW again. Pt very tearful over situation, reporting she's not sure why her legs don't work with PT noting there's no significant observable change in her BLE compared to yesterday when she ambulated with this PT.  PT offered to take pt outside or to gift shop, or to use nu-step but pt very tearful, declining further participation & requesting to return to room. Pt left in w/c with chair alarm donned & call bell, all needs in reach. Pain: 2/10 in LLE, 7/10 in RLE but pt is premedicated & pain patches applied, attempted to distract pt during session  Discussed use of ted hose with PA (Pam) due to BLE edema but PA wants pt to elevate BLE as much as possible & defer ted hose at this time 2/2 impaired sensation, pt already with small skin tears on BLE, and poor healing.   Treatment 2: Pt received in bathroom with NT assisting her off of the toilet & pt handed over to PT. Pt performs hand hygiene at sink from w/c level. Pt in better spirits this afternoon but requests to loosen shoes, as she feels she tied them too tight. Pt adjusted laces with BLE propped on trash can with extra time but no physical assist. Pt's husband arrived & PT educated on pt's performance during morning session & reviewed ability for pt to complete transfers at home with RW or slide board depending upon pt's ability & safety that day. Pt's husband reports he feels comfort assisting with mobility at home. Pt propels w/c with BUE & mod I & is able to remove leg rests & lock brakes without assistance. Pt completes car transfer at SUV simulated height with RW & heavy min>light mod  assist via stand pivot with significant BUE reliance on RW with cuing for sequencing and assist to safely sit on car seat as pt unable to step to R to square up to car seat. Pt requires UE assist to place RLE in/out of car then returns to w/c with RW & min assist. Educated pt & husband on need for pt to use w/c in community and encouraged pt to propel herself as much as possible to maintain CLOF & independence. Pt completes lateral scoot w/c<>nu-step with min assist (educated pt to always use slide board for transfers in this manner with husband/caregiver). Pt utilized nu-step on  level 5 x 10 minutes with all four extremities with task focusing on global strengthening & endurance training, and coordination of reciprocal movements. Pt requires frequent assistance to reposition R foot on pedal 2/2 weakness & inability to maintain position. Back in room, pt transfers w/c>bed via lateral scoot with min assist, sit>supine with min assist for RLE. Pt left in bed with alarm set, call bell in reach, & husband in room. Pain: 1/10 "all over" with pt reporting she's premedicated   Therapy Documentation Precautions:  Precautions Precautions: Fall Precaution Comments: weak and unsteady RLE Restrictions Weight Bearing Restrictions: No    General: PT Amount of Missed Time (min): 15 Minutes PT Missed Treatment Reason: Patient unwilling to participate    Therapy/Group: Individual Therapy  Sandi Mariscal 03/22/2020, 2:36 PM

## 2020-03-22 NOTE — Progress Notes (Addendum)
[facility] PHYSICAL MEDICINE & REHABILITATION PROGRESS NOTE  Subjective/Complaints:  Right leg bothering her again today. Can't get comfortable this morning.  ROS: Patient denies fever, rash, sore throat, blurred vision, nausea, vomiting, diarrhea, cough, shortness of breath or chest pain  or mood change.     Objective: Vital Signs: Blood pressure (!) 130/55, pulse 79, temperature 97.8 F (36.6 C), temperature source Oral, resp. rate 16, height 5\' 4"  (1.626 m), weight 60.7 kg, SpO2 100 %. No results found. Recent Labs    03/20/20 0435  WBC 5.8  HGB 9.9*  HCT 31.9*  PLT 231   Recent Labs    03/20/20 0435  NA 137  K 4.0  CL 98  CO2 30  GLUCOSE 140*  BUN 14  CREATININE 0.94  CALCIUM 8.9    Physical Exam: BP (!) 130/55 (BP Location: Right Arm)   Pulse 79   Temp 97.8 F (36.6 C) (Oral)   Resp 16   Ht 5\' 4"  (1.626 m)   Wt 60.7 kg   SpO2 100%   BMI 22.97 kg/m    Constitutional: looks uncomfortable. Vital signs reviewed. HEENT: EOMI, oral membranes moist Neck: supple Cardiovascular: RRR without murmur. No JVD    Respiratory/Chest: CTA Bilaterally without wheezes or rales. Normal effort    GI/Abdomen: BS +, non-tender, non-distended Ext: no clubbing, cyanosis, or edema Psych: pleasant albeit a little distressed Neuro: Motor: Right lower extremity: Hip flexion, knee extension tr-1/5, ankle dorsiflexion 2-/5. Right leg  sensitive to touch Left lower extremity: Hip flexion, knee extension 4+/5, 0-tr/5 ankle DF, 2 PF. Has definite atrophy RLE.  Decreased sensation in BLE, right more than left with sensory loss up to mid trunk.--motor/sensory stable today 8/9.  Skin: incisions cdi RLE, buttocks wound with foam dressing  Assessment/Plan: 1. 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  Physiatrist is providing close team supervision and 24 hour management  of active medical problems listed below.  Physiatrist and rehab team continue to assess barriers to discharge/monitor patient progress toward functional and medical goals  Care Tool:  Bathing    Body parts bathed by patient: Right arm, Left arm, Chest, Abdomen, Face, Right upper leg, Left upper leg, Front perineal area, Buttocks, Left lower leg, Right lower leg   Body parts bathed by helper: Right lower leg     Bathing assist Assist Level: Contact Guard/Touching assist     Upper Body Dressing/Undressing Upper body dressing   What is the patient wearing?: Pull over shirt, Bra    Upper body assist Assist Level: Set up assist    Lower Body Dressing/Undressing Lower body dressing      What is the patient wearing?: Pants     Lower body assist Assist for lower body dressing: Minimal Assistance - Patient > 75%     Toileting Toileting    Toileting assist Assist for toileting: Minimal Assistance - Patient > 75%     Transfers Chair/bed transfer  Transfers assist  Chair/bed transfer activity did not occur: Refused (due to 10/10 pain and fatigue)  Chair/bed transfer assist level: Minimal Assistance - Patient > 75%     Locomotion Ambulation   Ambulation assist   Ambulation activity did not occur: Refused (due to 10/10 pain and fatigue)  Assist level: Minimal Assistance - Patient > 75% Assistive device: Walker-rolling Max distance: 53 ft   Walk 10 feet activity   Assist  Walk 10 feet activity  did not occur: Refused (due to 10/10 pain and fatigue)  Assist level: Minimal Assistance - Patient > 75% Assistive device: Walker-rolling   Walk 50 feet activity   Assist Walk 50 feet with 2 turns activity did not occur: Refused  Assist level: Minimal Assistance - Patient > 75% Assistive device: Walker-rolling    Walk 150 feet activity   Assist Walk 150 feet activity did not occur: Refused         Walk 10 feet on uneven surface  activity   Assist Walk  10 feet on uneven surfaces activity did not occur: Refused (due to 10/10 pain and fatigue)         Wheelchair     Assist Will patient use wheelchair at discharge?: Yes Type of Wheelchair: Manual Wheelchair activity did not occur: Refused (due to 10/10 pain and fatigue)  Wheelchair assist level: Set up assist Max wheelchair distance: 150 ft    Wheelchair 50 feet with 2 turns activity    Assist    Wheelchair 50 feet with 2 turns activity did not occur: Refused (due to 10/10 pain and fatigue)   Assist Level: Set up assist   Wheelchair 150 feet activity     Assist Wheelchair 150 feet activity did not occur: Refused (due to 10/10 pain and fatigue)   Assist Level: Set up assist     Medical Problem List and Plan: 1. Weakness with impairments in mobility and ADLs, alteration in sensation secondary to diabetic amyotrophy/lumbo-sacral polyradiculopathy.  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   -goals of mgt: increase mobility, aggressive pain mgt, glucose control, positive pt outlook  -Mrs Skilton need an 98" x 16" w/c for daily mobility and ADL's given her ongoing lower extremity weakness and sensory deficits related to her polyradiculopathy 2.  Antithrombotics: -DVT/anticoagulation:  Pharmaceutical: Continue Lovenox given limited ambulation.             -antiplatelet therapy: N/A 3. Chronic neck/back/Pain Management: Continue Methadone 20 mg tid, Pamelor 25 mg/hs and Nuycnta prn. Tizanidine tid prn for spasms.    8/11: pt had been feeling better with gabapentin 600mg  q6    -may use nucynta for breakthrough pain   -continue massage, sensory stimulation of RLE   - neuropsych to follow up re: relaxation/pain mgt techniques   -continue to work on leisure/therapy activities    -provide daily positive reinforcement    -will resume nortriptyline 10mg  QHS tonight 4.  Mood: LCSW to  follow for evaluation ad support.              -antipsychotic agents: N/A 5. Neuropsych: This patient is capable of making decisions on her own behalf. 6. Skin/Wound Care: Routine pressure relief measures.  7. Fluids/Electrolytes/Nutrition: Monitor I/O.   8/9: Hypokalemia- resolved at 4.0   -continue daily dosing for now.     9.T2DM with hyperglycemia-poorly controlled: A1c- 8.6.               CBG (last 3)  Recent Labs    03/21/20 1614 03/21/20 2049 03/22/20 0545  GLUCAP 166* 132* 147*   8/11 increase levemir to 15u bid---for better control 10. Normocytic anemia: Stable overall. Monitor for signs of infection.              Hemoglobin 9.9 8/9 11. Low calorie malnutrition: Continue supplement 12. Hyponatremia/ chronic Hypomagnesemia:   Sodium 137 on 8/9  Mag normal 1.8 on 7/26  13. Abnormal LFTs: Resolved 14.  Drug-induced constipation: Dulcolax 10mg  daily effective.   Improving  8/10 bm    15. Persistent daily HA: On topamax 100 mg/day.   addn of gabapentin above has helped headaches also 16.  Thrombocytopenia  Platelets 231 8/9 17. Insomnia: Melatonin 5mg  HS   LOS: 19 days A FACE TO FACE EVALUATION WAS PERFORMED  10/9 03/22/2020, 8:50 AM

## 2020-03-22 NOTE — Plan of Care (Signed)
  Problem: Consults Goal: RH GENERAL PATIENT EDUCATION Description: See Patient Education module for education specifics. Outcome: Progressing Goal: Skin Care Protocol Initiated - if Braden Score 18 or less Description: If consults are not indicated, leave blank or document N/A Outcome: Progressing Goal: Diabetes Guidelines if Diabetic/Glucose > 140 Description: If diabetic or lab glucose is > 140 mg/dl - Initiate Diabetes/Hyperglycemia Guidelines & Document Interventions  Outcome: Progressing   Problem: RH SKIN INTEGRITY Goal: RH STG SKIN FREE OF INFECTION/BREAKDOWN Description: Skin to remain free of infection with mod assist Outcome: Progressing Goal: RH STG MAINTAIN SKIN INTEGRITY WITH ASSISTANCE Description: STG Maintain Skin Integrity With  mod Assistance. Outcome: Progressing   Problem: RH SAFETY Goal: RH STG ADHERE TO SAFETY PRECAUTIONS W/ASSISTANCE/DEVICE Description: STG Adhere to Safety Precautions With  mod Assistance/Device. Outcome: Progressing Goal: RH STG DECREASED RISK OF FALL WITH ASSISTANCE Description: STG Decreased Risk of Fall With mod  Assistance. Outcome: Progressing   Problem: RH PAIN MANAGEMENT Goal: RH STG PAIN MANAGED AT OR BELOW PT'S PAIN GOAL Description: Pain <3  Outcome: Progressing

## 2020-03-22 NOTE — Progress Notes (Signed)
Occupational Therapy Session Note  Patient Details  Name: Monica [name] MRN: [id number] Date of Birth: 15-Oct-1950  Today's Date: 03/22/2020 OT Individual Time: 6728-9791 OT Individual Time Calculation (min): 56 min    Short Term Goals: Week 1:  OT Short Term Goal 1 (Week 1): Pt will complete toilet transfer with min assist. OT Short Term Goal 1 - Progress (Week 1): Met OT Short Term Goal 2 (Week 1): Pt will utilize AE to complete LB dressing with mod A OT Short Term Goal 2 - Progress (Week 1): Met OT Short Term Goal 3 (Week 1): Pt will engage in functional task in standing for 2 minutes to incresae endurance for ADLs. OT Short Term Goal 3 - Progress (Week 1): Met  Skilled Therapeutic Interventions/Progress Updates:    1:1. Pt received in w/c agreeable to OT with 5/10 pain that is reducing because of medications. OT edu re shoe funnel process and with significantly increased time pt able to don shoes/AFOs with step stool and shoe funnel and VC for "wiggling" motion to push RLE into shoe. Pt able ot tighten laces with increased time and foot on stool to decrease forward bend. Pt provided with handout of foldable stool and shoe funnel for husband to purchase. Pt completes seated and standing wii bowling with S for seated and MIN A for static standing balnce. Pt unable to complete unilateral suport on RW to bowl standing without pt leaning forward and knees buckling. Exited session wihtp tsated in bed, exit alarm on and call light in reach  Therapy Documentation Precautions:  Precautions Precautions: Fall Precaution Comments: weak and unsteady RLE Restrictions Weight Bearing Restrictions: No General:   Vital Signs:  Pain:   ADL:   Vision   Perception    Praxis   Exercises:   Other Treatments:     Therapy/Group: Individual Therapy  Tonny Branch 03/22/2020, 9:27 AM

## 2020-03-23 ENCOUNTER — Inpatient Hospital Stay (HOSPITAL_COMMUNITY): Payer: Medicare Other | Admitting: Occupational Therapy

## 2020-03-23 ENCOUNTER — Inpatient Hospital Stay (HOSPITAL_COMMUNITY): Payer: Medicare Other | Admitting: Physical Therapy

## 2020-03-23 DIAGNOSIS — E1165 Type 2 diabetes mellitus with hyperglycemia: Secondary | ICD-10-CM | POA: Diagnosis not present

## 2020-03-23 DIAGNOSIS — E1144 Type 2 diabetes mellitus with diabetic amyotrophy: Secondary | ICD-10-CM | POA: Diagnosis not present

## 2020-03-23 DIAGNOSIS — R519 Headache, unspecified: Secondary | ICD-10-CM | POA: Diagnosis not present

## 2020-03-23 LAB — GLUCOSE, CAPILLARY
Glucose-Capillary: 115 mg/dL — ABNORMAL HIGH (ref 70–99)
Glucose-Capillary: 233 mg/dL — ABNORMAL HIGH (ref 70–99)
Glucose-Capillary: 119 mg/dL — ABNORMAL HIGH (ref 70–99)
Glucose-Capillary: 168 mg/dL — ABNORMAL HIGH (ref 70–99)

## 2020-03-23 MED ORDER — NORTRIPTYLINE HCL 10 MG PO CAPS: 10.0000 mg | ORAL_CAPSULE | Freq: Every day | ORAL | Status: DC

## 2020-03-23 MED ORDER — NORTRIPTYLINE HCL 25 MG PO CAPS
25.0000 mg | ORAL_CAPSULE | Freq: Every day | ORAL | Status: DC
  Administered 2020-03-23: 25 mg via ORAL
  Filled 2020-03-23: qty 1

## 2020-03-23 NOTE — Progress Notes (Signed)
Physical Therapy Session Note  Patient Details  Name: Monica [name] MRN: [id number] Date of Birth: 22-Feb-1951  Today's Date: 03/23/2020 PT Individual Time: 0802-0859 PT Individual Time Calculation (min): 57 min   Short Term Goals: Week 3:  PT Short Term Goal 1 (Week 3): STG = LTG due to anticipated d/c date.  Skilled Therapeutic Interventions/Progress Updates: Pt presents supine in bed, c/o pain to BLEs of 10/10.  Pt agrees to therapy stating "will just work though it".  Pt required supervision for sup to sit transfers to EOB.  Pt required min A to don B shoes w/ AFOS at EO, using trash can on side as stool.  Pt required increased time.Pt states pain decreased to 5/10 w/ activity.  Pt transferred lateral scoot bed > w/c w/ CGA after elevating bed.  Pt negotiated w/c through hallways w/ independence at least 150'.  Pt performed sit to stand transfers multiple trials w/ min A and stabilization of R knee.  Pt transferred w/c <> car at simulated 26" height using RW and min A.  Pt amb x 2 approx. 8' w/ RW and min A, but then c/o knees buckling.  Pt wheeled self back to room and positioned beside bed.  Chair alarm on and all needs in reach.      Therapy Documentation Precautions:  Precautions Precautions: Fall Precaution Comments: weak and unsteady RLE Restrictions Weight Bearing Restrictions: No General:   Vital Signs:   Pain:10/10 prior to therapy w/ pain meds, lidoderm patch to R knee and K-pad to LLE, decreased to 5/10 w/ activity. Pain Assessment Pain Scale: 0-10 Pain Score: 10-Worst pain ever Pain Type: Acute pain Pain Location: Leg Pain Orientation: Left;Right Pain Descriptors / Indicators: Aching Pain Frequency: Intermittent Pain Onset: On-going Patients Stated Pain Goal: 2 Pain Intervention(s): Medication (See eMAR) (prn tylenol ) Mobility:   Locomotion :    Trunk/Postural Assessment :    Balance:   Exercises:   Other Treatments:      Therapy/Group: Individual  Therapy  [name] 03/23/2020, 9:42 AM

## 2020-03-23 NOTE — Progress Notes (Addendum)
Occupational Therapy Discharge Summary  Patient Details  Name: Monica [name] MRN: [id number] Date of Birth: 08-25-1950  Today's Date: 03/23/2020 OT Individual Time: 1345-1458 OT Individual Time Calculation (min): 73 min    Pt greeted seated in wheelchair and agreeable to OT treatment session focused on increased independence with BADL tasks. Pt doffed shoes, socks, and AFOs with supervision. Lateral scoot from wc onto tub bench with close supervision. Bathing completed from tub bench with supervision overall and lateral leans to wash buttocks. Lateral scoot out of shower with CGA. Dressing completed from wc. Pt able to collect clothing from wc, donned shirt independently. She was able to thread pants, but needed CGA to pull pants up. Pt decline to [facility] AFOs again and just donned socks mod I! Pt left seated in wc at the sink to blow dry and curl hair.   Patient has met 7 of 7 long term goals due to improved activity tolerance, improved balance, postural control, ability to compensate for deficits and functional use of  RIGHT lower and LEFT lower extremity.  Patient to discharge at [facility] Assist level.  Patient's care partner is independent to provide the necessary physical assistance at discharge.    Reasons goals not met: n/a  Recommendation: Patient will benefit from ongoing skilled OT services in home health setting to continue to advance functional skills in the area of BADL and Reduce care partner burden.  Equipment: 18x16 wc, shower chair (purchasing privately)  Reasons for discharge: treatment goals met and discharge from hospital  Patient/family agrees with progress made and goals achieved: Yes  OT Discharge Precautions/Restrictions  Precautions Precautions: Fall Pain  Pt reports pain is controlled at this time. Complains of numbness in LEs.  ADL ADL Eating: Independent Grooming: Independent Upper Body Bathing: Supervision/safety Lower Body Bathing:  Supervision/safety Upper Body Dressing: Independent Lower Body Dressing: Contact guard Toileting: Minimal assistance Toilet Transfer: Minimal assistance Tub/Shower Transfer: Minimal assistance Perception  Perception: Within Functional Limits Praxis Praxis: Intact Cognition Overall Cognitive Status: Within Functional Limits for tasks assessed Arousal/Alertness: Awake/alert Orientation Level: Oriented X4 Attention: Sustained;Alternating Sustained Attention: Appears intact Memory: Appears intact Awareness: Appears intact Problem Solving: Appears intact Safety/Judgment: Appears intact Sensation Sensation Light Touch Impaired Details: Impaired LLE;Impaired RLE;Impaired LUE;Impaired RUE Proprioception: Impaired by gross assessment Coordination Gross Motor Movements are Fluid and Coordinated: No (2/2 significant BLE weakness & impaired neuromuscular control) Fine Motor Movements are Fluid and Coordinated: Yes (in BUE) Motor  Motor Motor - Discharge Observations: generalized deconditioning/weakness, significant BLE weakness Mobility  Bed Mobility Bed Mobility: Supine to Sit;Sit to Supine Supine to Sit: Supervision/Verbal cueing Sit to Supine: Supervision/Verbal cueing Transfers Sit to Stand: Minimal Assistance - Patient > 75% Stand to Sit: Minimal Assistance - Patient > 75%  Trunk/Postural Assessment  Cervical Assessment Cervical Assessment: Exceptions to [facility] (forward head) Thoracic Assessment Thoracic Assessment: Exceptions to [facility] (rounded shoulders) Lumbar Assessment Lumbar Assessment: Exceptions to [facility] (posterior pelvic tilt)  Balance Balance Balance Assessed: Yes Static Sitting Balance Static Sitting - Balance Support: Feet supported Static Sitting - Level of Assistance: 6: Modified independent (Device/Increase time) Dynamic Sitting Balance Dynamic Sitting - Balance Support: Feet supported Dynamic Sitting - Level of Assistance: 5: Stand by assistance Static Standing  Balance Static Standing - Balance Support: Bilateral upper extremity supported;During functional activity Static Standing - Level of Assistance: 4: Min assist (CGA) Dynamic Standing Balance Dynamic Standing - Balance Support: During functional activity Dynamic Standing - Level of Assistance: 4: Min assist Extremity/Trunk Assessment RUE Assessment RUE Assessment:  Within Functional Limits LUE Assessment LUE Assessment: Within Functional Limits   Valma Cava 03/23/2020, 2:47 PM

## 2020-03-23 NOTE — Plan of Care (Signed)
  Problem: Consults Goal: [facility] GENERAL PATIENT EDUCATION Description: See Patient Education module for education specifics. Outcome: Progressing Goal: Skin Care Protocol Initiated - if Braden Score 18 or less Description: If consults are not indicated, leave blank or document N/A Outcome: Progressing Goal: Diabetes Guidelines if Diabetic/Glucose > 140 Description: If diabetic or lab glucose is > 140 mg/dl - Initiate Diabetes/Hyperglycemia Guidelines & Document Interventions  Outcome: Progressing   Problem: RH SKIN INTEGRITY Goal: RH STG SKIN FREE OF INFECTION/BREAKDOWN Description: Skin to remain free of infection with mod assist Outcome: Progressing Goal: RH STG MAINTAIN SKIN INTEGRITY WITH ASSISTANCE Description: STG Maintain Skin Integrity With  mod Assistance. Outcome: Progressing   Problem: RH SAFETY Goal: RH STG ADHERE TO SAFETY PRECAUTIONS W/ASSISTANCE/DEVICE Description: STG Adhere to Safety Precautions With  mod Assistance/Device. Outcome: Progressing Goal: RH STG DECREASED RISK OF FALL WITH ASSISTANCE Description: STG Decreased Risk of Fall With mod  Assistance. Outcome: Progressing   Problem: RH PAIN MANAGEMENT Goal: RH STG PAIN MANAGED AT OR BELOW PT'S PAIN GOAL Description: Pain <3  Outcome: Progressing   

## 2020-03-23 NOTE — Plan of Care (Signed)
  Problem: RH Grooming °Goal: LTG Patient will perform grooming w/assist,cues/equip (OT) °Description: LTG: Patient will perform grooming with assist, with/without cues using equipment (OT) °Outcome: Completed/Met °  °Problem: RH Bathing °Goal: LTG Patient will bathe all body parts with assist levels (OT) °Description: LTG: Patient will bathe all body parts with assist levels (OT) °Outcome: Completed/Met °  °Problem: RH Dressing °Goal: LTG Patient will perform upper body dressing (OT) °Description: LTG Patient will perform upper body dressing with assist, with/without cues (OT). °Outcome: Completed/Met °Goal: LTG Patient will perform lower body dressing w/assist (OT) °Description: LTG: Patient will perform lower body dressing with assist, with/without cues in positioning using equipment (OT) °Outcome: Completed/Met °  °Problem: RH Toileting °Goal: LTG Patient will perform toileting task (3/3 steps) with assistance level (OT) °Description: LTG: Patient will perform toileting task (3/3 steps) with assistance level (OT)  °Outcome: Completed/Met °  °Problem: RH Toilet Transfers °Goal: LTG Patient will perform toilet transfers w/assist (OT) °Description: LTG: Patient will perform toilet transfers with assist, with/without cues using equipment (OT) °Outcome: Completed/Met °  °Problem: RH Tub/Shower Transfers °Goal: LTG Patient will perform tub/shower transfers w/assist (OT) °Description: LTG: Patient will perform tub/shower transfers with assist, with/without cues using equipment (OT) °Outcome: Completed/Met °  °

## 2020-03-23 NOTE — Progress Notes (Signed)
[place] PHYSICAL MEDICINE & REHABILITATION PROGRESS NOTE  Subjective/Complaints:  Woke up with a lot of pain in RLE but worked throught it with therapy this morning and had a productive session. Determined to get through this  ROS: Patient denies fever, rash, sore throat, blurred vision, nausea, vomiting, diarrhea, cough, shortness of breath or chest pain,   headache, or mood change.    Objective: Vital Signs: Blood pressure 130/72, pulse 82, temperature 99.5 F (37.5 C), temperature source Oral, resp. rate 18, height 5\' 4"  (1.626 m), weight 60.7 kg, SpO2 96 %. No results found. No results for input(s): WBC, HGB, HCT, PLT in the last 72 hours. No results for input(s): NA, K, CL, CO2, GLUCOSE, BUN, CREATININE, CALCIUM in the last 72 hours.  Physical Exam: BP 130/72 (BP Location: Left Arm)   Pulse 82   Temp 99.5 F (37.5 C) (Oral)   Resp 18   Ht 5\' 4"  (1.626 m)   Wt 60.7 kg   SpO2 96%   BMI 22.97 kg/m    Constitutional: No distress . Vital signs reviewed. HEENT: EOMI, oral membranes moist Neck: supple Cardiovascular: RRR without murmur. No JVD    Respiratory/Chest: CTA Bilaterally without wheezes or rales. Normal effort    GI/Abdomen: BS +, non-tender, non-distended Ext: no clubbing, cyanosis, or edema Psych: pleasant and cooperative Neuro: Motor: Right lower extremity: Hip flexion, knee extension tr-1/5, ankle dorsiflexion 2-/5. Right leg remains somewhat sensitive to touch Left lower extremity: Hip flexion, knee extension 4+/5, 0-tr/5 ankle DF, 2 PF. Has definite atrophy RLE is ongoing  Decreased sensation in BLE, right more than left with sensory loss up to mid trunk.--motor/sensory stable today 8/9.  Skin: incisions remain RLE, buttocks wound with foam dressing  Assessment/Plan: 1. 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  Physiatrist is providing  close team supervision and 24 hour management of active medical problems listed below.  Physiatrist and rehab team continue to assess barriers to discharge/monitor patient progress toward functional and medical goals  Care Tool:  Bathing    Body parts bathed by patient: Right arm, Left arm, Chest, Abdomen, Face, Right upper leg, Left upper leg, Front perineal area, Buttocks, Left lower leg, Right lower leg   Body parts bathed by helper: Right lower leg     Bathing assist Assist Level: Contact Guard/Touching assist     Upper Body Dressing/Undressing Upper body dressing   What is the patient wearing?: Pull over shirt, Bra    Upper body assist Assist Level: Set up assist    Lower Body Dressing/Undressing Lower body dressing      What is the patient wearing?: Pants     Lower body assist Assist for lower body dressing: Minimal Assistance - Patient > 75%     Toileting Toileting    Toileting assist Assist for toileting: Minimal Assistance - Patient > 75%     Transfers Chair/bed transfer  Transfers assist  Chair/bed transfer activity did not occur: Refused (due to 10/10 pain and fatigue)  Chair/bed transfer assist level: Contact Guard/Touching assist     Locomotion Ambulation   Ambulation assist   Ambulation activity did not occur: Refused (due to 10/10 pain and fatigue)  Assist level: Minimal Assistance - Patient > 75% Assistive device: Walker-rolling Max distance: 8'   Walk 10 feet activity   Assist  Walk 10 feet activity did not occur: Refused (due to 10/10 pain and fatigue)  Assist level: Minimal Assistance - Patient > 75% Assistive device: Walker-rolling   Walk 50 feet activity   Assist Walk 50 feet with 2 turns activity did not occur: Refused  Assist level: Minimal Assistance - Patient > 75% Assistive device: Walker-rolling    Walk 150 feet activity   Assist Walk 150 feet activity did not occur: Refused         Walk 10 feet on uneven  surface  activity   Assist Walk 10 feet on uneven surfaces activity did not occur: Refused (due to 10/10 pain and fatigue)         Wheelchair     Assist Will patient use wheelchair at discharge?: Yes Type of Wheelchair: Manual Wheelchair activity did not occur: Refused (due to 10/10 pain and fatigue)  Wheelchair assist level: Independent Max wheelchair distance: 200'    Wheelchair 50 feet with 2 turns activity    Assist    Wheelchair 50 feet with 2 turns activity did not occur: Refused (due to 10/10 pain and fatigue)   Assist Level: Independent   Wheelchair 150 feet activity     Assist Wheelchair 150 feet activity did not occur: Refused (due to 10/10 pain and fatigue)   Assist Level: Independent     Medical Problem List and Plan: 1. Weakness with impairments in mobility and ADLs, alteration in sensation secondary to diabetic amyotrophy/lumbo-sacral polyradiculopathy.  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   -goals of mgt: increase mobility, aggressive pain mgt, glucose control, positive pt outlook  -Mrs Sullenger need an 42" x 16" w/c for daily mobility and ADL's given her ongoing lower extremity weakness and sensory deficits related to her polyradiculopathy 2.  Antithrombotics: -DVT/anticoagulation:  Pharmaceutical: Continue Lovenox given limited ambulation.             -antiplatelet therapy: N/A 3. Chronic neck/back/Pain Management: Continue Methadone 20 mg tid, Pamelor 25 mg/hs and Nuycnta prn. Tizanidine tid prn for spasms.    8/11: pt had been feeling better with gabapentin 600mg  q6    -may use nucynta for breakthrough pain   -continue massage, sensory stimulation of RLE   - neuropsych to follow up re: relaxation/pain mgt techniques   -continue to work on leisure/therapy activities    -provide daily positive reinforcement    -will resume nortriptyline at prior  25mg  QHS dose tonight 4.  Mood: LCSW to follow for evaluation ad support.              -antipsychotic agents: N/A 5. Neuropsych: This patient is capable of making decisions on her own behalf. 6. Skin/Wound Care: Routine pressure relief measures.  7. Fluids/Electrolytes/Nutrition: Monitor I/O.   8/9: Hypokalemia- resolved at 4.0   -continue daily dosing for now.     9.T2DM with hyperglycemia-poorly controlled: A1c- 8.6.               CBG (last 3)  Recent Labs    03/22/20 1658 03/22/20 2056 03/23/20 0604  GLUCAP 153* 223* 115*   8/11 increased levemir to 15u bid---for better control 8/12 no changes today but will need further titration once home most likely 10. Normocytic anemia: Stable overall. Monitor for signs of infection.              Hemoglobin 9.9 8/9 11. Low calorie malnutrition: Continue supplement 12. Hyponatremia/ chronic Hypomagnesemia:   Sodium 137 on 8/9  Mag normal 1.8 on 7/26 13. Abnormal  LFTs: Resolved 14.  Drug-induced constipation: Dulcolax 10mg  daily effective.   Improving  8/10 bm    15. Persistent daily HA: On topamax 100 mg/day.   addn of gabapentin above has helped headaches as well 16.  Thrombocytopenia  Platelets 231 8/9 17. Insomnia: Melatonin 5mg  HS   LOS: 20 days A FACE TO FACE EVALUATION WAS PERFORMED  10/9 03/23/2020, 10:25 AM

## 2020-03-23 NOTE — Progress Notes (Signed)
Physical Therapy Session Note  Patient Details  Name: Monica [name] MRN: [id number] Date of Birth: 12/27/1950  Today's Date: 03/23/2020 PT Individual Time: 1300-1339 PT Individual Time Calculation (min): 39 min   Short Term Goals: Week 3:  PT Short Term Goal 1 (Week 3): STG = LTG due to anticipated d/c date.  Skilled Therapeutic Interventions/Progress Updates:      Pt received sitting in WC and agreeable to PT. WC mobility through hall with supervision assist 2 128f and cues for doorway management.   Gait training x 180f Noted R LE knee instability as well as decreased step length Bil R>L than in previous session with this PT. Pt unable to ambulate >10 ft at this time due to hip and knee weakness.   Stand pivot and sit stand tranfers with min assist throughout session and tactile cues to maintain neutral knee alignment and improve activation of quads/glutes to power into standing.   HEP wth hand out provided. LAQ, reciprocal march, heel slides, and press ups. Each completed x 5 BLE with cues to reduce use of UE to attain movement on the RLE.   [facility] reach and all needs met.      Therapy Documentation Precautions:  Precautions Precautions: Fall Precaution Comments: weak and unsteady RLE Restrictions Weight Bearing Restrictions: No Pain:   denies   Therapy/Group: Individual Therapy  AuLorie Phenix/07/2020, 2:20 PM

## 2020-03-23 NOTE — Progress Notes (Signed)
Occupational Therapy Session Note  Patient Details  Name: [name] MRN: [id number] Date of Birth: Mar 06, 1951  Today's Date: 03/23/2020 OT Individual Time: 5790-3833 OT Individual Time Calculation (min): 30 min   Short Term Goals: Week 2:  OT Short Term Goal 1 (Week 2): LTG=STG 2/2 ELOS  Skilled Therapeutic Interventions/Progress Updates:    Pt greeted sitting in wc finishing breakfast that came late. Pt tangential regarding her breakfast tray and needed cues to stay on task to get to the gym. OT brought pt to therapy gym in wc. Worked on LB there-x with 3 sets of 10 alternating toe taps and hip adduction squeezes on ball. Pt needed OT assist to achieve enough R hip extension to lift foot on top of cone. Pt propelled wc back to room and was left seated in wc with alarm belt on, call bell in reach, and needs met.   Therapy Documentation Precautions:  Precautions Precautions: Fall Precaution Comments: weak and unsteady RLE Restrictions Weight Bearing Restrictions: No Pain: No number given, pt reports pain is better controlled   Therapy/Group: Individual Therapy  Valma Cava 03/23/2020, 11:24 AM

## 2020-03-24 DIAGNOSIS — R519 Headache, unspecified: Secondary | ICD-10-CM | POA: Diagnosis not present

## 2020-03-24 DIAGNOSIS — E1144 Type 2 diabetes mellitus with diabetic amyotrophy: Secondary | ICD-10-CM | POA: Diagnosis not present

## 2020-03-24 DIAGNOSIS — E1165 Type 2 diabetes mellitus with hyperglycemia: Secondary | ICD-10-CM | POA: Diagnosis not present

## 2020-03-24 LAB — GLUCOSE, CAPILLARY: Glucose-Capillary: 193 mg/dL — ABNORMAL HIGH (ref 70–99)

## 2020-03-24 MED ORDER — INSULIN LISPRO (1 UNIT DIAL) 100 UNIT/ML (KWIKPEN): PEN_INJECTOR | SUBCUTANEOUS | 11 refills | Status: DC

## 2020-03-24 MED ORDER — LANTUS SOLOSTAR 100 UNIT/ML ~~LOC~~ SOPN: 15.0000 [IU] | PEN_INJECTOR | Freq: Two times a day (BID) | SUBCUTANEOUS | 0 refills | Status: DC

## 2020-03-24 MED ORDER — CAREFINE PEN NEEDLES 31G X 6 MM MISC: 1.0000 "application " | Freq: Four times a day (QID) | 1 refills | Status: DC

## 2020-03-24 MED ORDER — INSULIN DETEMIR 100 UNIT/ML ~~LOC~~ SOLN
15.0000 [IU] | Freq: Two times a day (BID) | SUBCUTANEOUS | Status: DC
  Filled 2020-03-24: qty 0.15

## 2020-03-24 MED ORDER — GABAPENTIN 300 MG PO CAPS: 600.0000 mg | ORAL_CAPSULE | Freq: Four times a day (QID) | ORAL | 0 refills | Status: DC

## 2020-03-24 MED ORDER — LIDOCAINE 5 % EX PTCH: 2.0000 | MEDICATED_PATCH | CUTANEOUS | 0 refills | Status: DC

## 2020-03-24 MED ORDER — BISACODYL EC 5 MG PO TBEC: 10.0000 mg | DELAYED_RELEASE_TABLET | Freq: Every day | ORAL | 0 refills | Status: DC

## 2020-03-24 MED ORDER — TOPIRAMATE 50 MG PO TABS: 100.0000 mg | ORAL_TABLET | ORAL | Status: DC

## 2020-03-24 MED ORDER — LANTUS SOLOSTAR 100 UNIT/ML ~~LOC~~ SOPN: 15.0000 [IU] | PEN_INJECTOR | Freq: Every day | SUBCUTANEOUS | 0 refills | Status: DC

## 2020-03-24 MED ORDER — NORTRIPTYLINE HCL 25 MG PO CAPS: 25.0000 mg | ORAL_CAPSULE | Freq: Every day | ORAL | 0 refills | Status: DC

## 2020-03-24 MED ORDER — ACETAMINOPHEN 325 MG PO TABS: 325.0000 mg | ORAL_TABLET | ORAL | Status: AC | PRN

## 2020-03-24 NOTE — Discharge Instructions (Signed)
Inpatient Rehab Discharge Instructions  Monica [name] Discharge date and time: 03/24/20   Activities/Precautions/ Functional Status: Activity: activity as tolerated Diet: diabetic diet Wound Care: keep wound clean and dry    Functional status:  ___ No restrictions     ___ Walk up steps independently _X__ 24/7 supervision/assistance   ___ Walk up steps with assistance ___ Intermittent supervision/assistance  ___ Bathe/dress independently ___ Walk with walker     ___ Bathe/dress with assistance ___ Walk Independently    ___ Shower independently ___ Walk with assistance    _X__ Shower with assistance _X__ No alcohol     ___ Return to work/school ________   Special Instructions: 1. Need to monitor blood sugars before meals and at bedtime. Take record with you to PCP office. Goal of blood sugars is for better control and to titrate medication to help achieve 80-100 range to help prevent progression of your condition. 2. Continue home exercise plan three times a day.   COMMUNITY REFERRALS UPON DISCHARGE:    Home Health:   PT     OT                    Agency: [facility] Phone: [phone]  *Please expect follow-up within 2-3 days of your discharge to schedule your home visit. If you have not received follow-up, be sure to contact the liaison Elnita Maxwell [phone]) directly.*  Medical Equipment/Items Ordered: wheelchair                                                 Agency/Supplier: [facility] [phone]    My questions have been answered and I understand these instructions. I will adhere to these goals and the provided educational materials after my discharge from the hospital.  Patient/Caregiver Signature _______________________________ Date __________  Clinician Signature _______________________________________ Date __________  Please bring this form and your medication list with you to all your follow-up doctor's appointments.

## 2020-03-24 NOTE — Progress Notes (Addendum)
[facility] PHYSICAL MEDICINE & REHABILITATION PROGRESS NOTE  Subjective/Complaints:  Had a much better night. Slept in recliner. Pain still there but seems more manageable. Excited about discharge today  ROS: Patient denies fever, rash, sore throat, blurred vision, nausea, vomiting, diarrhea, cough, shortness of breath or chest pain, headache, or mood change.     Objective: Vital Signs: Blood pressure 134/81, pulse 74, temperature (!) 97.5 F (36.4 C), temperature source Oral, resp. rate 16, height 5\' 4"  (1.626 m), weight 60.7 kg, SpO2 100 %. No results found. No results for input(s): WBC, HGB, HCT, PLT in the last 72 hours. No results for input(s): NA, K, CL, CO2, GLUCOSE, BUN, CREATININE, CALCIUM in the last 72 hours.  Physical Exam: BP 134/81 (BP Location: Left Arm)   Pulse 74   Temp (!) 97.5 F (36.4 C) (Oral)   Resp 16   Ht 5\' 4"  (1.626 m)   Wt 60.7 kg   SpO2 100%   BMI 22.97 kg/m    Constitutional: No distress . Vital signs reviewed. HEENT: EOMI, oral membranes moist Neck: supple Cardiovascular: RRR without murmur. No JVD    Respiratory/Chest: CTA Bilaterally without wheezes or rales. Normal effort    GI/Abdomen: BS +, non-tender, non-distended Ext: no clubbing, cyanosis, or edema Psych: pleasant and cooperative Neuro: Motor: Right lower extremity: Hip flexion, knee extension tr-1/5, ankle dorsiflexion 2-/5. Right leg remains somewhat sensitive to touch Left lower extremity: Hip flexion, knee extension 4+/5, 0-tr/5 ankle DF, 2 PF. Atrophic RLE  1/2 sensation to LT in BLE, right more than left with sensory loss up to mid trunk.--motor/sensory stable today 8/12.  Skin: incisions CDI RLE, buttocks wound healing  Assessment/Plan: 1. 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  Physiatrist is providing close team supervision and 24 hour management of active  medical problems listed below.  Physiatrist and rehab team continue to assess barriers to discharge/monitor patient progress toward functional and medical goals  Care Tool:  Bathing    Body parts bathed by patient: Right arm, Left arm, Chest, Abdomen, Face, Right upper leg, Left upper leg, Front perineal area, Buttocks, Left lower leg, Right lower leg   Body parts bathed by helper: Right lower leg     Bathing assist Assist Level: Supervision/Verbal cueing     Upper Body Dressing/Undressing Upper body dressing   What is the patient wearing?: Pull over shirt    Upper body assist Assist Level: Supervision/Verbal cueing    Lower Body Dressing/Undressing Lower body dressing      What is the patient wearing?: Pants, Underwear/pull up     Lower body assist Assist for lower body dressing: Contact Guard/Touching assist     Toileting Toileting    Toileting assist Assist for toileting: Contact Guard/Touching assist     Transfers Chair/bed transfer  Transfers assist  Chair/bed transfer activity did not occur: Refused (due to 10/10 pain and fatigue)  Chair/bed transfer assist level: Contact Guard/Touching assist     Locomotion Ambulation   Ambulation assist   Ambulation activity did not occur: Refused (due to 10/10 pain and fatigue)  Assist level: Minimal Assistance - Patient > 75% Assistive device: Walker-rolling Max distance: 8'   Walk 10 feet activity   Assist  Walk 10 feet activity did not occur: Refused (due to 10/10 pain and fatigue)  Assist level: Minimal Assistance - Patient > 75% Assistive device: Walker-rolling   Walk 50 feet activity  Assist Walk 50 feet with 2 turns activity did not occur: Refused  Assist level: Minimal Assistance - Patient > 75% Assistive device: Walker-rolling    Walk 150 feet activity   Assist Walk 150 feet activity did not occur: Refused         Walk 10 feet on uneven surface  activity   Assist Walk 10 feet  on uneven surfaces activity did not occur: Refused (due to 10/10 pain and fatigue)         Wheelchair     Assist Will patient use wheelchair at discharge?: Yes Type of Wheelchair: Manual Wheelchair activity did not occur: Refused (due to 10/10 pain and fatigue)  Wheelchair assist level: Independent Max wheelchair distance: 200'    Wheelchair 50 feet with 2 turns activity    Assist    Wheelchair 50 feet with 2 turns activity did not occur: Refused (due to 10/10 pain and fatigue)   Assist Level: Independent   Wheelchair 150 feet activity     Assist Wheelchair 150 feet activity did not occur: Refused (due to 10/10 pain and fatigue)   Assist Level: Independent     Medical Problem List and Plan: 1. 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 She requires elevated leg rests for lower extremity edema control and pain relief, and she requires anti-tippers on this chair due to severe trunk and core muscle weakness with propioceptive deficits 2.  Antithrombotics: -DVT/anticoagulation:  Pharmaceutical: Continue Lovenox given limited ambulation.             -antiplatelet therapy: N/A 3. Chronic neck/back/Pain Management: Continue Methadone 20 mg tid, Pamelor 25 mg/hs and Nuycnta prn. Tizanidine tid prn for spasms.    8/13: gabapentin 600mg  q6   -  nucynta for breakthrough pain   -continue massage, sensory stimulation of RLE   - neuropsych to follow up re: relaxation/pain mgt techniques   -continue to work on leisure/therapy activities    -provide daily positive reinforcement    -resumed nortriptyline at 25mg  QHS     -spoke  with about how her emotions impact her pain and working on ways not only to control pain but to keep emotions in check when pain is more intense. 4.  Mood: LCSW to follow for evaluation ad support.              -antipsychotic agents: N/A 5. Neuropsych: This patient is capable of making decisions on her own behalf. 6. Skin/Wound Care: Routine pressure relief measures.  7. Fluids/Electrolytes/Nutrition: Monitor I/O.   8/9: Hypokalemia- resolved at 4.0   -continue daily dosing for now.     9.T2DM with hyperglycemia-poorly controlled: A1c- 8.6.               CBG (last 3)  Recent Labs    03/23/20 1624 03/23/20 2126 03/24/20 0633  GLUCAP 119* 168* 193*     8/13: increase levemir to 15u bid  -reinforced need for better glycemic control at home 10. Normocytic anemia: Stable overall. Monitor for signs of infection.              Hemoglobin 9.9  8/9 11. Low calorie malnutrition: Continue supplement 12. Hyponatremia/ chronic Hypomagnesemia:   Sodium 137 on 8/9  Mag normal 1.8 on 7/26 13. Abnormal LFTs: Resolved 14.  Drug-induced constipation: Dulcolax 10mg  daily effective.   Improving  8/10 bm    15. Persistent daily HA: On topamax 100 mg/day.   addn of gabapentin above has helped headaches as well 16.  Thrombocytopenia  Platelets 231 8/9 17. Insomnia: Melatonin 5mg  HS   LOS: 21 days A FACE TO FACE EVALUATION WAS PERFORMED  10/9 03/24/2020, 9:24 AM

## 2020-03-24 NOTE — Progress Notes (Addendum)
Patient ID: Monica [name], female   DOB: [birth year], 69 y.o.   MRN: [id number]  SW spoke with pt husband Monica [name] [phone]) to discuss if he received w/c. States he received w/c. SW reiterated updates from [facility] and encouraged follow-up if needed.   *SW received updates from [employer] ([id number]:[phone]/f:[phone]) to request more supporting documentation for w/c. Reports the request was submitted to Medicare and will follow-up with family if approved. Currently, the husband is private pay for w/c at rental rate per month. SW faxed supporting documentation.   Cecile Sheerer, MSW, LCSWA Office: [phone] Cell: [phone] Fax: [phone]

## 2020-03-24 NOTE — Discharge Summary (Signed)
Physician Discharge Summary  Patient ID: Monica [name] MRN: [id number] DOB/AGE: 1951-05-18 69 y.o.  Admit date: 03/03/2020 Discharge date: 03/24/2020  Discharge Diagnoses:  Principal Problem:   Diabetic amyotrophy [facility]) Active Problems:   Thrombocytopenia (HCC)   Persistent headaches   Drug induced constipation   Uncontrolled type 2 diabetes mellitus with hyperglycemia (HCC)   Pressure injury of skin   Discharged Condition: Stable  Significant Diagnostic Studies: US Abdomen Limited RUQ  Result Date: 03/01/2020 CLINICAL DATA:  Abnormal LFTs. EXAM: ULTRASOUND ABDOMEN LIMITED RIGHT UPPER QUADRANT COMPARISON:  CT abdomen pelvis dated Dec 30, 2019. FINDINGS: Gallbladder: Surgically absent. Common bile duct: Diameter: 5 mm, normal. Liver: No focal lesion identified. Patchy, geographic areas of increased parenchymal echogenicity. Portal vein is patent on color Doppler imaging with normal direction of blood flow towards the liver. Other: None. IMPRESSION: 1. No acute abnormality. 2. Patchy, geographic areas of increased hepatic parenchymal echogenicity, suggestive of underlying liver disease and/or steatosis. Electronically Signed   By: Titus Dubin M.D.   On: 03/01/2020 07:43    Labs:  Basic Metabolic Panel: BMP Latest Ref Rng & Units 03/20/2020 03/14/2020 03/13/2020  Glucose 70 - 99 mg/dL 140(H) 96 238(H)  BUN 8 - 23 mg/dL '14 17 17  '$ Creatinine 0.44 - 1.00 mg/dL 0.94 0.79 0.81  BUN/Creat Ratio 12 - 28 - - -  Sodium 135 - 145 mmol/L 137 137 135  Potassium 3.5 - 5.1 mmol/L 4.0 3.7 3.4(L)  Chloride 98 - 111 mmol/L 98 105 104  CO2 22 - 32 mmol/L '30 26 25  '$ Calcium 8.9 - 10.3 mg/dL 8.9 8.5(L) 8.4(L)    CBC: CBC Latest Ref Rng & Units 03/20/2020 03/13/2020 03/06/2020  WBC 4.0 - 10.5 K/uL 5.8 8.2 5.5  Hemoglobin 12.0 - 15.0 g/dL 9.9(L) 9.7(L) 10.2(L)  Hematocrit 36 - 46 % 31.9(L) 30.7(L) 32.2(L)  Platelets 150 - 400 K/uL 231 252 172    CBG: Recent Labs  Lab 03/23/20 0604 03/23/20 1148  03/23/20 1624 03/23/20 2126 03/24/20 0633  GLUCAP 115* 233* 119* 168* 193*    Brief HPI:   Monica [name] is a 69 y.o. female with history of uncontrolled T2DM with polyneuropathy, cervicalgia, chronic headaches, chronic pain, gait disorder with multiple falls who has had progressive weakness with inability to walk for about 4 weeks prior to admission.  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  She was started on steroids while awaiting IVIG however due to issues with progressive weakness as well as severe pain and AKI she was admitted for management.  She was started on IV fluids for hydration and empirically treated with IVIG with some improvement in weakness and sensation.  Neurology was following for work-up and recommended muscle biopsy.  She underwent right sural nerve and right superficial quadriceps muscle biopsy by Dr. Ronnald Ramp on 07/19.  Dysesthesias RLE had greatly improved however she continued to be limited by weakness and impairments in mobility and ADLs.  CIR was recommended due to functional deficits.   Hospital Course: Monica [name] was admitted to rehab 03/03/2020 for inpatient therapies to consist of PT, ST and OT at least three hours five days a week. Past admission physiatrist, therapy team and rehab RN have worked together to provide customized collaborative inpatient rehab. Biopsy sites on RLE are healing well without signs of infection.  Chronic pain due to  migraines as well as cervicalgia has waxed and waned but has been been managed on home regimen of Methadone with [place] prn and Pamelor at bedtime. Gabapentin was titrated up to 600 mg QID to help with neuropathic symptoms. Follow up CBC showed anemia to be stable and thrombocytopenia has resolved. She continues on  Mag Ox to supplement hypomagnesemia. Abnormal LFTs felt to be secondary to IVIG and have resolved.   Blood pressures were monitored on TID basis and has been stable. Her intake has improved with addition of nutritional supplements. Her diabetes has been monitored with ac/hs CBG checks and SSI was use prn for tighter BS control.  Levemir was increased to 15 units bid but changed to lantus at discharge due to insurance preference. Novolog has been titrated to 10 units with breakfast and 8 units ac lunch/supper. She has been educated on importance of adhering to CM diet, need for protein supplement as well as monitoring cbg's ac/hs basis. She is to follow up with PCP for further adjustment of insulin regimen.  She was briefly treated with IV steroids however pathology was negative for inflammatory changes therefore this was discontinued.  Current decline felt to be due to diabetic amyotrophy.   She has had issues with intermittent hypokalemia which was intermittent supplementation on serial check of BMET.  She was started on daily supplementation with follow-up labs showing improvement in potassium levels.  Her renal status is within normal limits. Stage 2 ulcers on buttocks and left heel are healing well and with skin is pink and dry. Dulcolax was used daily to help manage OIC. Dr. Rodenbough/neuropsychologist has followed up to help with strategies to manage stress and worry secondary to her current medical issues.  Team has also continue to provide ego support, education and encouragement throughout her stay.  She has progressed to min assist at wheelchair level.  She will continue to receive follow-up home health PT and OT by [facility] after discharge.   Rehab course: During patient's stay in rehab weekly team conferences were held to monitor patient's progress, set goals and discuss barriers to discharge. At admission, patient required mod assist with mobility and mod to max assist with ADL tasks. She is  showing improvement in use of BLE with use of AFO's to manage foot drop.  She  has had improvement in activity tolerance, balance, postural control as well as ability to compensate for deficits. She is able to complete ADL tasks with min assist. She is able to perform transfers with min assist and is able to ambulate 10' with   Disposition: Home  Diet: Carb modified.   Special Instructions: 1.  Monitor blood sugars ac/hs and follow up with PCP for further adjustment in insulin. 2.  Repeat BMET in 1 to 2 weeks to monitor potassium and magnesium levels.  Discharge Instructions    Ambulatory referral to Physical Medicine Rehab   Complete by: As directed      Allergies as of 03/24/2020      Reactions   Aspirin Anaphylaxis, Hives   Slurred speech, blurred vision- also   Hydromorphone Hcl Shortness Of Breath, Other (See Comments)   Severe shortness of breath   Vimpat [lacosamide] Other (See Comments)   Hallucinations, out of body experience, confusion    Depakote [divalproex Sodium] Other (See Comments)   Reaction not recalled by husband   Milnacipran Hcl Other (See Comments)   Dizziness   Pristiq [desvenlafaxine Succinate Monohydrate] Other (See Comments)   Dizziness   Capsaicin  Rash, Other (See Comments)   Red rash   Gabapentin Other (See Comments)   States leg pain worsened, pt d/c'd      Medication List    STOP taking these medications   dapagliflozin propanediol 10 MG Tabs tablet Commonly known as: [place]   feeding supplement (ENSURE ENLIVE) Liqd   metFORMIN 500 MG tablet Commonly known as: GLUCOPHAGE   ondansetron 4 MG tablet Commonly known as: ZOFRAN     TAKE these medications   acetaminophen 325 MG tablet Commonly known as: TYLENOL Take 1-2 tablets (325-650 mg total) by mouth every 4 (four) hours as needed for mild pain.   atorvastatin 10 MG tablet Commonly known as: LIPITOR Take 1 tablet (10 mg total) by mouth daily.   bisacodyl 5 MG EC tablet Generic  drug: bisacodyl Take 2 tablets (10 mg total) by mouth daily. What changed:   how much to take  when to take this  reasons to take this   blood glucose meter kit and supplies Kit Dispense based on patient and insurance preference. Use up to four times daily as directed. (FOR ICD-9 250.00, 250.01).   CareFine Pen Needles 31G X 6 MM Misc Generic drug: Insulin Pen Needle 1 application by Does not apply route in the morning, at noon, in the evening, and at bedtime.   gabapentin 300 MG capsule Commonly known as: NEURONTIN Take 2 capsules (600 mg total) by mouth every 6 (six) hours.   insulin lispro 100 UNIT/ML KwikPen Commonly known as: HUMALOG Use 10 units with breakfast. Use 8 units with lunch and supper.   Lantus SoloStar 100 UNIT/ML Solostar Pen Generic drug: insulin glargine Inject 15 Units into the skin 2 (two) times daily.   lidocaine 5 % Commonly known as: LIDODERM Place 2 patches onto the skin daily. Apply at 8 am and remove at 8 pm--purchase over the counter. Start taking on: March 25, 2020   magnesium oxide 400 (241.3 Mg) MG tablet Commonly known as: MAG-OX Take 1 tablet (400 mg total) by mouth 2 (two) times daily.   methadone 10 MG tablet Commonly known as: DOLOPHINE Take 2 tablets (20 mg total) by mouth 3 (three) times daily.   nortriptyline 25 MG capsule Commonly known as: PAMELOR Take 1 capsule (25 mg total) by mouth at bedtime.   tapentadol 50 MG tablet Commonly known as: NUCYNTA Take 1 tablet (50 mg total) by mouth every 12 (twelve) hours as needed. What changed: reasons to take this   tiZANidine 2 MG tablet Commonly known as: ZANAFLEX TAKE (1) TABLET EVERY SIX HOURS AS NEEDED FOR MUSCLE SPASMS. What changed: See the new instructions.   topiramate 50 MG tablet Commonly known as: TOPAMAX Take 2-4 tablets (100-200 mg total) by mouth See admin instructions. Take 100 mg by mouth in the morning and 200 mg at bedtime What changed: See the new  instructions.       Follow-up Information    Meredith Staggers, MD Follow up.   Specialty: Physical Medicine and Rehabilitation Why: Office will call with follow up appointment Contact information: [place] [address] [phone]        Loman [place], [place]. Call.   Specialty: Family Medicine Why: for post hospital follow up. Insulin adjustment Contact information: [place] [address] [phone]        Melvenia Beam, MD. Call.   Specialty: Neurology Why: for follow up appointment Contact information: [address]  [place] [address] [phone]               Signed: Bary Leriche 03/24/2020, 9:50 AM
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
## 2020-03-24 NOTE — Progress Notes (Signed)
Pt is awake alert and oriented this morning and was medicated this morning. Pt performed own insulin injection for practice. Pt's husband is at bedside, Marissa Nestle- PA is going over AVS, and this nurse and NT Morrison Old will escort pt and family outside when ready to d/c

## 2020-03-24 NOTE — Progress Notes (Signed)
Inpatient Rehabilitation Care Coordinator  Discharge Note  The overall goal for the admission was met for:   Discharge location: Yes. Pt to d/c to home with 24/7 care from husband. Husband reports he will have assistance to bump pt into home; ramp to be built this weekend.   Length of Stay: Yes. 20 days.   Discharge activity level: Yes. Min A.  Home/community participation: Yes. Limited.  Services provided included: MD, RD, PT, OT, RN, CM, TR, Pharmacy, Neuropsych and SW  Financial Services: Medicare and AARP  Follow-up services arranged: Home Health: [facility] for PT/OT and DME: [place] for w/c  Comments (or additional information): contact pt  or pt husband Lennette Bihari # [phone]  Patient/Family verbalized understanding of follow-up arrangements: Yes  Individual responsible for coordination of the follow-up plan: Pt to have assistance with coordinating care needs.   Confirmed correct DME delivered: Rana Snare 03/24/2020    Rana Snare

## 2020-03-28 ENCOUNTER — Telehealth: Payer: Self-pay | Admitting: Family Medicine

## 2020-03-29 NOTE — Telephone Encounter (Signed)
Appointment scheduled for 08/24 @ 2:20pm with Alona Bene.

## 2020-03-29 NOTE — Telephone Encounter (Signed)
R/C for hospital f/u

## 2020-03-31 ENCOUNTER — Telehealth: Payer: Self-pay | Admitting: *Deleted

## 2020-03-31 NOTE — Telephone Encounter (Signed)
HHRN is asking about the etiology of the wound of the of lower extremity for billing purposes. Please advise.

## 2020-04-01 NOTE — Telephone Encounter (Signed)
Pt has biopsy sites on leg

## 2020-04-03 NOTE — Telephone Encounter (Signed)
Notified. 

## 2020-04-04 ENCOUNTER — Other Ambulatory Visit: Payer: Self-pay

## 2020-04-04 ENCOUNTER — Ambulatory Visit (INDEPENDENT_AMBULATORY_CARE_PROVIDER_SITE_OTHER): Payer: Medicare Other | Admitting: Family Medicine

## 2020-04-04 ENCOUNTER — Encounter: Payer: Self-pay | Admitting: Family Medicine

## 2020-04-04 ENCOUNTER — Telehealth: Payer: Self-pay | Admitting: Pharmacist

## 2020-04-04 VITALS — BP 119/67 | HR 81 | Temp 97.8°F

## 2020-04-04 DIAGNOSIS — G894 Chronic pain syndrome: Secondary | ICD-10-CM

## 2020-04-04 DIAGNOSIS — F331 Major depressive disorder, recurrent, moderate: Secondary | ICD-10-CM

## 2020-04-04 DIAGNOSIS — E1144 Type 2 diabetes mellitus with diabetic amyotrophy: Secondary | ICD-10-CM

## 2020-04-04 DIAGNOSIS — E1142 Type 2 diabetes mellitus with diabetic polyneuropathy: Secondary | ICD-10-CM

## 2020-04-04 DIAGNOSIS — R945 Abnormal results of liver function studies: Secondary | ICD-10-CM

## 2020-04-04 DIAGNOSIS — R7989 Other specified abnormal findings of blood chemistry: Secondary | ICD-10-CM

## 2020-04-04 DIAGNOSIS — L89152 Pressure ulcer of sacral region, stage 2: Secondary | ICD-10-CM

## 2020-04-04 DIAGNOSIS — Z794 Long term (current) use of insulin: Secondary | ICD-10-CM

## 2020-04-04 NOTE — Telephone Encounter (Signed)
Recommended patient increase to Lantus 20 units BID Restart metformin BID F/u with pharmD by telephone on Friday Face to face appt with PharmD in 4 weeks
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
## 2020-04-05 LAB — CMP14+EGFR
ALT: 13 IU/L (ref 0–32)
AST: 10 IU/L (ref 0–40)
Albumin/Globulin Ratio: 1.4 (ref 1.2–2.2)
Albumin: 3.9 g/dL (ref 3.8–4.8)
Alkaline Phosphatase: 202 IU/L — ABNORMAL HIGH (ref 48–121)
BUN/Creatinine Ratio: 48 — ABNORMAL HIGH (ref 12–28)
BUN: 36 mg/dL — ABNORMAL HIGH (ref 8–27)
Bilirubin Total: <0.2 mg/dL (ref 0.0–1.2)
CO2: 26 mmol/L (ref 20–29)
Calcium: 9.3 mg/dL (ref 8.7–10.3)
Chloride: 97 mmol/L (ref 96–106)
Creatinine, Ser: 0.75 mg/dL (ref 0.57–1.00)
GFR calc Af Amer: 95 mL/min/{1.73_m2} (ref >59)
GFR calc non Af Amer: 83 mL/min/{1.73_m2} (ref >59)
Globulin, Total: 2.8 g/dL (ref 1.5–4.5)
Glucose: 194 mg/dL — ABNORMAL HIGH (ref 65–99)
Potassium: 4.5 mmol/L (ref 3.5–5.2)
Sodium: 134 mmol/L (ref 134–144)
Total Protein: 6.7 g/dL (ref 6.0–8.5)

## 2020-04-05 LAB — CBC WITH DIFFERENTIAL/PLATELET
Basophils Absolute: 0.1 10*3/uL (ref 0.0–0.2)
Basos: 1 %
EOS (ABSOLUTE): 0.3 10*3/uL (ref 0.0–0.4)
Eos: 4 %
Hematocrit: 32.6 % — ABNORMAL LOW (ref 34.0–46.6)
Hemoglobin: 10.3 g/dL — ABNORMAL LOW (ref 11.1–15.9)
Immature Grans (Abs): 0 10*3/uL (ref 0.0–0.1)
Immature Granulocytes: 0 %
Lymphocytes Absolute: 1.6 10*3/uL (ref 0.7–3.1)
Lymphs: 23 %
MCH: 27.7 pg (ref 26.6–33.0)
MCHC: 31.6 g/dL (ref 31.5–35.7)
MCV: 88 fL (ref 79–97)
Monocytes Absolute: 0.7 10*3/uL (ref 0.1–0.9)
Monocytes: 10 %
Neutrophils Absolute: 4.2 10*3/uL (ref 1.4–7.0)
Neutrophils: 62 %
Platelets: 233 10*3/uL (ref 150–450)
RBC: 3.72 x10E6/uL — ABNORMAL LOW (ref 3.77–5.28)
RDW: 13.9 % (ref 11.7–15.4)
WBC: 6.8 10*3/uL (ref 3.4–10.8)

## 2020-04-05 LAB — MAGNESIUM: Magnesium: 1.9 mg/dL (ref 1.6–2.3)

## 2020-04-07 ENCOUNTER — Ambulatory Visit: Payer: Medicare Other | Admitting: Student

## 2020-04-10 DIAGNOSIS — Z794 Long term (current) use of insulin: Secondary | ICD-10-CM | POA: Insufficient documentation

## 2020-04-10 MED ORDER — LANTUS SOLOSTAR 100 UNIT/ML ~~LOC~~ SOPN: 20.0000 [IU] | PEN_INJECTOR | Freq: Two times a day (BID) | SUBCUTANEOUS | 0 refills | Status: DC

## 2020-04-10 MED ORDER — METFORMIN HCL ER 500 MG PO TB24: 500.0000 mg | ORAL_TABLET | Freq: Two times a day (BID) | ORAL | 2 refills | Status: DC

## 2020-04-12 NOTE — Addendum Note (Signed)
Addended by: Deliah Boston F on: 04/12/2020 12:34 PM   Modules accepted: Level of Service

## 2020-04-19 ENCOUNTER — Encounter: Payer: Self-pay | Admitting: Physical Medicine & Rehabilitation

## 2020-04-19 ENCOUNTER — Other Ambulatory Visit: Payer: Self-pay

## 2020-04-19 ENCOUNTER — Other Ambulatory Visit: Payer: Self-pay | Admitting: Physical Medicine & Rehabilitation

## 2020-04-19 ENCOUNTER — Telehealth: Payer: Self-pay | Admitting: Neurology

## 2020-04-19 ENCOUNTER — Encounter: Payer: Medicare Other | Attending: Physical Medicine & Rehabilitation | Admitting: Physical Medicine & Rehabilitation

## 2020-04-19 VITALS — BP 131/79 | HR 91 | Temp 98.8°F | Ht 64.0 in | Wt 132.0 lb

## 2020-04-19 DIAGNOSIS — G43011 Migraine without aura, intractable, with status migrainosus: Secondary | ICD-10-CM

## 2020-04-19 DIAGNOSIS — G4486 Cervicogenic headache: Secondary | ICD-10-CM

## 2020-04-19 DIAGNOSIS — R519 Headache, unspecified: Secondary | ICD-10-CM | POA: Diagnosis present

## 2020-04-19 DIAGNOSIS — M47812 Spondylosis without myelopathy or radiculopathy, cervical region: Secondary | ICD-10-CM

## 2020-04-19 DIAGNOSIS — M7918 Myalgia, other site: Secondary | ICD-10-CM

## 2020-04-19 DIAGNOSIS — G894 Chronic pain syndrome: Secondary | ICD-10-CM

## 2020-04-19 DIAGNOSIS — G43711 Chronic migraine without aura, intractable, with status migrainosus: Secondary | ICD-10-CM

## 2020-04-19 DIAGNOSIS — Z79899 Other long term (current) drug therapy: Secondary | ICD-10-CM

## 2020-04-19 DIAGNOSIS — E1144 Type 2 diabetes mellitus with diabetic amyotrophy: Secondary | ICD-10-CM

## 2020-04-19 DIAGNOSIS — Z79891 Long term (current) use of opiate analgesic: Secondary | ICD-10-CM

## 2020-04-19 DIAGNOSIS — Z5181 Encounter for therapeutic drug level monitoring: Secondary | ICD-10-CM

## 2020-04-19 MED ORDER — TAPENTADOL HCL 75 MG PO TABS: 75.0000 mg | ORAL_TABLET | Freq: Two times a day (BID) | ORAL | 0 refills | Status: DC | PRN

## 2020-04-19 MED ORDER — NORTRIPTYLINE HCL 50 MG PO CAPS: 50.0000 mg | ORAL_CAPSULE | Freq: Every day | ORAL | 4 refills | Status: DC

## 2020-04-19 MED ORDER — GABAPENTIN 600 MG PO TABS: 600.0000 mg | ORAL_TABLET | Freq: Every day | ORAL | 3 refills | Status: DC

## 2020-04-19 MED ORDER — METHADONE HCL 10 MG PO TABS: 20.0000 mg | ORAL_TABLET | Freq: Three times a day (TID) | ORAL | 0 refills | Status: DC

## 2020-04-19 NOTE — Patient Instructions (Signed)
Make sure you follow up with Dr. Lucia Gaskins.

## 2020-04-19 NOTE — Progress Notes (Signed)
Subjective:    Patient ID: Monica [name], female    DOB: [birth year], 69 y.o.   MRN: [id number] Need refill on Gabapentin, Methodone, Nortriptyline today   HPI    Lanelle is here in follow up of her chronic pain and diabetic amyotrophy.  We discharged her home from rehab last month.  She has been receiving home health physical and occupational therapy.  Physical therapy is still coming out to the house working on standing and mobility as well as lower extremity strengthening.  Reesha continues to have significant pain in both her legs especially at nighttime after dinner and towards bedtime.  She remains on gabapentin 600 mg 4 times daily, nortriptyline 25 mg at bedtime and her methadone for baseline.  She feels that her left leg is becoming more painful and perhaps a bit weaker since she was in the hospital.  She has not followed up nor made a follow-up appointment with neurology yet.  I asked about diabetes control and morning sugars are running around 1 20-1 40 most days.  It was 140 this morning.  I am not sure they are checking levels consistently throughout the day.   Pain Inventory Average Pain 8 Pain Right Now 10 My pain is constant, sharp, burning, stabbing, tingling and aching  In the last 24 hours, has pain interfered with the following? General activity 9 Relation with others 9 Enjoyment of life 10 What TIME of day is your pain at its worst? night Sleep (in general) Poor  Pain is worse with: some activites Pain improves with: heat/ice Relief from Meds: 4  Family History  Problem Relation Age of Onset  . Colon cancer Mother 57  . Heart disease Father   . Diabetes Father   . Diabetes Paternal Grandmother   . Heart disease Paternal Grandfather   . Diverticulitis Daughter    Social History   Socioeconomic History  . Marital status: Married    Spouse name: Not on file  . Number of children: 3  . Years of education: Not on file  . Highest education level: Not on  file  Occupational History  . Occupation: disabled    Associate Professor: UNEMPLOYED  Tobacco Use  . Smoking status: Never Smoker  . Smokeless tobacco: Never Used  Vaping Use  . Vaping Use: Never used  Substance and Sexual Activity  . Alcohol use: No  . Drug use: No  . Sexual activity: Yes    Birth control/protection: Post-menopausal  Other Topics Concern  . Not on file  Social History Narrative   Lives at home with husband    Right handed   Caffeine: none   Social Determinants of Health   Financial Resource Strain:   . Difficulty of Paying Living Expenses: Not on file  Food Insecurity:   . Worried About Programme researcher, broadcasting/film/video in the Last Year: Not on file  . Ran Out of Food in the Last Year: Not on file  Transportation Needs:   . Lack of Transportation (Medical): Not on file  . Lack of Transportation (Non-Medical): Not on file  Physical Activity:   . Days of Exercise per Week: Not on file  . Minutes of Exercise per Session: Not on file  Stress:   . Feeling of Stress : Not on file  Social Connections:   . Frequency of Communication with Friends and Family: Not on file  . Frequency of Social Gatherings with Friends and Family: Not on file  . Attends Religious Services: Not  on file  . Active Member of Clubs or Organizations: Not on file  . Attends Banker Meetings: Not on file  . Marital Status: Not on file   Past Surgical History:  Procedure Laterality Date  . BREAST LUMPECTOMY     left  . CATARACT EXTRACTION W/PHACO Left 10/07/2016   Procedure: CATARACT EXTRACTION PHACO AND INTRAOCULAR LENS PLACEMENT (IOC);  Surgeon: Gemma Payor, MD;  Location: [facility];  Service: Ophthalmology;  Laterality: Left;  CDE: 4.98  . CATARACT EXTRACTION W/PHACO Right 10/21/2016   Procedure: CATARACT EXTRACTION PHACO AND INTRAOCULAR LENS PLACEMENT RIGHT EYE CDE - 6.24;  Surgeon: Gemma Payor, MD;  Location: [facility];  Service: Ophthalmology;  Laterality: Right;  right  . CHOLECYSTECTOMY    .  COLONOSCOPY  11/17/08   ext hemorrhoids  . CRANIOTOMY     secondary TBI  . CYST EXCISION     left wrist  . ESOPHAGOGASTRODUODENOSCOPY  04/05/10   [name] (57F),erosive reflux esophagitis, small hiatal hernia,  . ESOPHAGOGASTRODUODENOSCOPY (EGD) WITH PROPOFOL N/A 08/30/2019   Procedure: ESOPHAGOGASTRODUODENOSCOPY (EGD) WITH PROPOFOL;  Surgeon: Corbin Ade, MD;  [facility];  Service: Endoscopy;  Laterality: N/A;  3:30pm - pt knows to arrive at 9:45  . EXTRACORPOREAL SHOCK WAVE LITHOTRIPSY Left 12/13/2019   Procedure: EXTRACORPOREAL SHOCK WAVE LITHOTRIPSY (ESWL);  Surgeon: Jerilee Field, MD;  Location: [facility];  Service: Urology;  Laterality: Left;  . FINGER SURGERY     removal cyst left pinky  . gastro empy study  03/03/10   normal  . HAND SURGERY    . MALONEY DILATION N/A 08/30/2019   Procedure: Elease Hashimoto DILATION;  Surgeon: Corbin Ade, MD;  [facility];  Service: Endoscopy;  Laterality: N/A;  . NECK SURGERY  1998  . NEPHROLITHOTOMY  05/13/2012   Procedure: NEPHROLITHOTOMY PERCUTANEOUS;  Surgeon: Marcine Matar, MD;  Location: [facility];  Service: Urology;  Laterality: Left;     . SURAL NERVE BX Right 02/28/2020   Procedure: SURAL NERVE / Quadricep BIOPSY;  Surgeon: Tia Alert, MD;  [facility];  Service: Neurosurgery;  Laterality: Right;   Past Surgical History:  Procedure Laterality Date  . BREAST LUMPECTOMY     left  . CATARACT EXTRACTION W/PHACO Left 10/07/2016   Procedure: CATARACT EXTRACTION PHACO AND INTRAOCULAR LENS PLACEMENT (IOC);  Surgeon: Gemma Payor, MD;  Location: [facility];  Service: Ophthalmology;  Laterality: Left;  CDE: 4.98  . CATARACT EXTRACTION W/PHACO Right 10/21/2016   Procedure: CATARACT EXTRACTION PHACO AND INTRAOCULAR LENS PLACEMENT RIGHT EYE CDE - 6.24;  Surgeon: Gemma Payor, MD;  Location: [facility];  Service: Ophthalmology;  Laterality: Right;  right  . CHOLECYSTECTOMY    . COLONOSCOPY  11/17/08    ext hemorrhoids  . CRANIOTOMY     secondary TBI  . CYST EXCISION     left wrist  . ESOPHAGOGASTRODUODENOSCOPY  04/05/10   [name] (57F),erosive reflux esophagitis, small hiatal hernia,  . ESOPHAGOGASTRODUODENOSCOPY (EGD) WITH PROPOFOL N/A 08/30/2019   Procedure: ESOPHAGOGASTRODUODENOSCOPY (EGD) WITH PROPOFOL;  Surgeon: Corbin Ade, MD;  [facility];  Service: Endoscopy;  Laterality: N/A;  3:30pm - pt knows to arrive at 9:45  . EXTRACORPOREAL SHOCK WAVE LITHOTRIPSY Left 12/13/2019   Procedure: EXTRACORPOREAL SHOCK WAVE LITHOTRIPSY (ESWL);  Surgeon: Jerilee Field, MD;  [facility];  Service: Urology;  Laterality: Left;  . FINGER SURGERY     removal cyst left pinky  . gastro empy study  03/03/10   normal  . HAND SURGERY    . MALONEY DILATION N/A 08/30/2019   Procedure: Elease HashimotoMALONEY DILATION;  Surgeon: Corbin Adeourk, Robert M, MD;  [facility];  Service: Endoscopy;  Laterality: N/A;  . NECK SURGERY  1998  . NEPHROLITHOTOMY  05/13/2012   Procedure: NEPHROLITHOTOMY PERCUTANEOUS;  Surgeon: Marcine MatarStephen Dahlstedt, MD;  Location: [facility];  Service: Urology;  Laterality: Left;     . SURAL NERVE BX Right 02/28/2020   Procedure: SURAL NERVE / Quadricep BIOPSY;  Surgeon: Tia AlertJones, David S, MD;  [facility];  Service: Neurosurgery;  Laterality: Right;   Past Medical History:  Diagnosis Date  . Arthritis   . Cervical facet joint syndrome 04/22/2012  . Cervicalgia   . Chronic headaches    Dr Hermelinda MedicusSchwartz [place] [facility]ist  . Chronic nausea    normal GES   . Complication of anesthesia    had problem with local anesthesia-tried to assist Physician  . DM (diabetes mellitus) (HCC)   . Elevated liver enzymes    hx of, normal 03/2010  . GERD (gastroesophageal reflux disease) 05/31/2011   History Schatzki's ring, erosive reflux esophagitis, status post EGD and dilation 8/11 and 11/20  . Glaucoma    lens in both eyes  . Hemorrhoids 11/17/08   Colonoscopy Dr  Karilyn Cotaehman  . Occipital neuralgia   . PTSD (post-traumatic stress disorder)   . Renal lithiasis   . 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 Torticollis   . Unspecified musculoskeletal disorders and symptoms referable to neck    cervical/trapezius   BP 131/79   Pulse 91   Temp 98.8 F (37.1 C)   Ht 5\' 4"  (1.626 m)   Wt 132 lb (59.9 kg)   SpO2 98%   BMI 22.66 kg/m   Opioid Risk Score:   Fall Risk Score:  `1  Depression screen PHQ 2/9  Dep[facility] 04/04/2020 01/11/2020 10/29/2019 10/22/2019 07/16/2019 06/29/2019 12/16/2018  Decreased Interest 0 0 0 0 0 1 1  Down, Depressed, Hopeless 3 1 0 0 0 1 1  PHQ - 2 Score 3 1 0 0 0 2 2  Altered sleeping 3 - - - - 3 -  Tired, decreased energy 3 - - - - 2 -  Change in appetite 3 - - - - 3 -  Feeling bad or failure about yourself  0 - - - - 1 -  Trouble concentrating 3 - - - - 3 -  Moving slowly or fidgety/restless 0 - - - - 2 -  Suicidal thoughts 1 - - - - 0 -  PHQ-9 Score 16 - - - - 16 -  Difficult doing work/chores - - - - - Not difficult at all -  Some recent data might be hidden    Review of Systems  Neurological: Positive for tremors, weakness, numbness and headaches.       Tingling  All other systems reviewed and are negative.      Objective:   Physical Exam  Gen: in distress d/t pain, normal appearing HEENT: oral mucosa pink and moist, NCAT Cardio: Reg rate Chest: normal effort, normal rate of breathing Abd: soft, non-distended Ext: Trace edema Skin: She is 3-4 unhealed abrasions in both legs from making contact with her wheelchair. Neuro: Upper extremity motor is 5 out of 5.  Lower extremity motor is 1+ to 2 - proximally to trace to 1 out of 5 distally.  He has decreased light  touch and pain sense in both legs right more than left.  She has dysesthesias in the right leg as well.  Reflexes are absent.  She had some mild sensory loss in the radial nerve distribution in both hands. Musculoskeletal: Further lower  extremity muscle wasting Psych: tearful d/t pain       Assessment & Plan:  1.  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  Patient still with significant pain in both legs right more than left with associated motor and sensory loss. 2.  Chronic migraine headaches 3.  Chronic cervical spondylosis with facet arthropathy and associated cervicalgia and headaches 4.  Diabetes type II: Still inadequately controlled  Plan:  1 continue with home health physical therapy to address range of motion and strength.  Stressed the importance of this  -discussed role of emotions and working on other means to help control pain and to re-focus away from her symptoms.  2.  Refilled her morphine 20 mg 3 times daily per baseline regimen  -We will continue the controlled substance monitoring program, this consists of regular clinic visits, examinations, routine drug screening, pill counts as well as use of [place] Controlled Substance Reporting System. [place].   3.  Increase Nucynta 75 mg every 12 hours as needed to better control neuropathic pain 4.  Increase gabapentin to 600 mg 3 times daily and 1200 mg in the evening for neuropathic pain symptoms 5.  Increase nortriptyline to 50 mg at bedtime 6.  Encouraged follow-up with neurology regarding further assessment.  I had some concerns given the radial sensory findings on exam today. 7.  Encouraged improved diabetes control .    Thirty minutes of face to face patient care time were spent during this visit. All questions were encouraged and answered. Follow up with me in 1 mo.

## 2020-04-19 NOTE — Telephone Encounter (Signed)
I was contacted by [name] at [facility]. Unfortunately nothing we can do for this patient except treat her pain, there is no treatment for diabetic amyotrophy. I would like her to be referred to [facility] for second opinion on diabetic amyotrophy. And if she is not in a pain clinic, please also refer her to one. Please call and discuss with her thanks.

## 2020-04-20 ENCOUNTER — Other Ambulatory Visit: Payer: Self-pay | Admitting: Physical Medicine & Rehabilitation

## 2020-04-20 DIAGNOSIS — M47812 Spondylosis without myelopathy or radiculopathy, cervical region: Secondary | ICD-10-CM

## 2020-04-20 DIAGNOSIS — Z5181 Encounter for therapeutic drug level monitoring: Secondary | ICD-10-CM

## 2020-04-20 DIAGNOSIS — G43011 Migraine without aura, intractable, with status migrainosus: Secondary | ICD-10-CM

## 2020-04-20 DIAGNOSIS — G894 Chronic pain syndrome: Secondary | ICD-10-CM

## 2020-04-20 DIAGNOSIS — Z79891 Long term (current) use of opiate analgesic: Secondary | ICD-10-CM

## 2020-04-20 DIAGNOSIS — G43711 Chronic migraine without aura, intractable, with status migrainosus: Secondary | ICD-10-CM

## 2020-04-20 DIAGNOSIS — Z79899 Other long term (current) drug therapy: Secondary | ICD-10-CM

## 2020-04-20 DIAGNOSIS — M7918 Myalgia, other site: Secondary | ICD-10-CM

## 2020-04-20 DIAGNOSIS — G4486 Cervicogenic headache: Secondary | ICD-10-CM

## 2020-04-21 ENCOUNTER — Other Ambulatory Visit: Payer: Self-pay | Admitting: Physical Medicine and Rehabilitation

## 2020-04-21 DIAGNOSIS — E1142 Type 2 diabetes mellitus with diabetic polyneuropathy: Secondary | ICD-10-CM

## 2020-04-25 ENCOUNTER — Other Ambulatory Visit: Payer: Self-pay | Admitting: Neurology

## 2020-04-25 DIAGNOSIS — G610 Guillain-Barre syndrome: Secondary | ICD-10-CM

## 2020-04-25 NOTE — Telephone Encounter (Signed)
I spoke to patient's husband. This is an unfair and devastating diagnoses. 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  We talked a long time about this diagnosis, diabetic amyotrophy, and their evaluation which was extensive including EMGs, imaging of the total neural axis of the abdomen pelvis, blood work, lumbar punctures, muscle biopsies, and more. In my opinion I am just not sure there is anything else that can be done or tested.  However I am not opposed and even encourage a second opinion, another pair of eyes in an academic/specialty center either [facility] or [facility] neurology, for evaluation and also possibly for any clinical trial opportunities.  We will send to neuromuscular specialist at [facility] however I am not sure that they will be told anything differently unfortunately.  We will send to pain management at [facility] neurosurgery for evaluation of other options such as spinal stimulator, I will CC Dr. Fortino Sic on here as he is currently managing their pain and has a pain contract so that he knows that patient may be seeing another pain specialist as well.  I expressed my empathy over this terribly devastating disorder, which includes tremendous pain, atrophy and often leaves patient devastated and disabled.  [facility] will [facility] to [facility] and to pain management for any other options at this point focusing on quality of life is quite important.  Annabelle Harman can you make sure toend everything to [facility], all my notes, tests, labwork, imaging on Cd, hospital results etc etc with referral. Also would you mind calling patient and schedule a 55-month follow-up with me for 1 hour.  In the meantime they will keep in touch with me about pain management, appreciate our wonderful colleagues at [facility], wonderful colleagues at physical rehab.  And they may also consider counseling and therapy to  deal with the life changes this has brought on.

## 2020-04-25 NOTE — Telephone Encounter (Signed)
Pt's husband called wanting to know where he should make a follow up appt for his wife. Please advise.

## 2020-04-26 NOTE — Telephone Encounter (Signed)
Sent to [facility] records have to be reviewed buy doctors and I have called GI and spoke to T.J and she is going to send images to [facility] via [facility] . I have left patient a message and her husband a well .

## 2020-05-03 ENCOUNTER — Other Ambulatory Visit: Payer: Self-pay | Admitting: *Deleted

## 2020-05-03 DIAGNOSIS — E1144 Type 2 diabetes mellitus with diabetic amyotrophy: Secondary | ICD-10-CM

## 2020-05-03 DIAGNOSIS — G43711 Chronic migraine without aura, intractable, with status migrainosus: Secondary | ICD-10-CM

## 2020-05-03 DIAGNOSIS — G4486 Cervicogenic headache: Secondary | ICD-10-CM

## 2020-05-03 MED ORDER — GABAPENTIN 600 MG PO TABS: 600.0000 mg | ORAL_TABLET | Freq: Every day | ORAL | 3 refills | Status: DC

## 2020-05-04 ENCOUNTER — Other Ambulatory Visit: Payer: Self-pay

## 2020-05-04 ENCOUNTER — Encounter: Payer: Self-pay | Admitting: Family Medicine

## 2020-05-04 ENCOUNTER — Ambulatory Visit (INDEPENDENT_AMBULATORY_CARE_PROVIDER_SITE_OTHER): Payer: Medicare Other | Admitting: Family Medicine

## 2020-05-04 VITALS — BP 108/68 | HR 86 | Temp 97.3°F

## 2020-05-04 DIAGNOSIS — Z794 Long term (current) use of insulin: Secondary | ICD-10-CM

## 2020-05-04 DIAGNOSIS — G894 Chronic pain syndrome: Secondary | ICD-10-CM

## 2020-05-04 DIAGNOSIS — E1144 Type 2 diabetes mellitus with diabetic amyotrophy: Secondary | ICD-10-CM

## 2020-05-04 DIAGNOSIS — K5903 Drug induced constipation: Secondary | ICD-10-CM | POA: Diagnosis not present

## 2020-05-04 DIAGNOSIS — E1142 Type 2 diabetes mellitus with diabetic polyneuropathy: Secondary | ICD-10-CM

## 2020-05-04 DIAGNOSIS — F331 Major depressive disorder, recurrent, moderate: Secondary | ICD-10-CM

## 2020-05-04 MED ORDER — ATORVASTATIN CALCIUM 10 MG PO TABS: 10.0000 mg | ORAL_TABLET | Freq: Every day | ORAL | 1 refills | Status: DC

## 2020-05-04 MED ORDER — METFORMIN HCL ER 500 MG PO TB24: 500.0000 mg | ORAL_TABLET | Freq: Two times a day (BID) | ORAL | 1 refills | Status: DC

## 2020-05-04 MED ORDER — LANTUS SOLOSTAR 100 UNIT/ML ~~LOC~~ SOPN: 20.0000 [IU] | PEN_INJECTOR | Freq: Two times a day (BID) | SUBCUTANEOUS | 0 refills | Status: DC

## 2020-05-04 MED ORDER — LINACLOTIDE 145 MCG PO CAPS: 145.0000 ug | ORAL_CAPSULE | Freq: Every day | ORAL | 1 refills | Status: DC

## 2020-05-04 NOTE — Progress Notes (Signed)
Assessment & Plan:  1. Type 2 diabetes mellitus with diabetic polyneuropathy, with long-term current use of insulin (HCC) Lab Results  Component Value Date   HGBA1C 8.6 (H) 02/23/2020   HGBA1C 9.4 (H) 12/31/2019   HGBA1C 9.9 (H) 10/22/2019  - Patient is not yet due for A1c but per her fasting blood glucose readings, her diabetes is doing good.  - Medications: continue current medications - Home glucose monitoring: continue monitoring - Patient is currently taking a statin. Patient is not taking an ACE-inhibitor/ARB.  - Last foot exam: 02/02/2020 - Last diabetic eye exam: 04/06/2019 - Urine Microalbumin/Creat Ratio: 06/29/2019 - insulin glargine (LANTUS SOLOSTAR) 100 UNIT/ML Solostar Pen; Inject 20 Units into the skin 2 (two) times daily.  Dispense: 15 mL; Refill: 0 - atorvastatin (LIPITOR) 10 MG tablet; Take 1 tablet (10 mg total) by mouth daily.  Dispense: 90 tablet; Refill: 1 - metFORMIN (GLUCOPHAGE-XR) 500 MG 24 hr tablet; Take 1 tablet (500 mg total) by mouth 2 (two) times daily with a meal.  Dispense: 180 tablet; Refill: 1  2. Drug induced constipation - Started Linzess. Also recommended Miralax 1 capful in 6-8 oz beverage of choice once daily as needed.  - linaclotide (LINZESS) 145 MCG CAPS capsule; Take 1 capsule (145 mcg total) by mouth daily before breakfast.  Dispense: 90 capsule; Refill: 1  3. 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 at the recommendation of her neurologist. She is no longer working with PT as she was not making progress. Her husband reports she quit trying due to the pain.   4. Moderate episode of recurrent major depressive disorder (HCC) - Patient declines treatment.   5. Chronic pain syndrome - Managed by pain specialist.    Return in about 2 months (around 07/04/2020) for DM.  Deliah Boston, MSN, APRN, FNP-C [facility]  Subjective:    Patient ID:  Monica [name], female    DOB: [birth year], 69 y.o.   MRN: [id number]  Patient Care Team: [name], FNP as PCP - General (Family Medicine) Laqueta Linden, MD (Inactive) as PCP - Cardiology (Cardiology) Corbin Ade, MD (Gastroenterology) Ranae Pila, MD as Consulting Physician (Obstetrics and Gynecology) Ranelle Oyster, MD as Consulting Physician (Physical Medicine and Rehabilitation)   Chief Complaint:  Chief Complaint  Patient presents with   Diabetes    check up of chronic medical conditoins   Constipation    Patient states this has been going on since April.    HPI: Monica [name] is a 69 y.o. female presenting on 05/04/2020 for Diabetes (check up of chronic medical conditoins) and Constipation (Patient states this has been going on since April.)  Patient is accompanied by her husband who she is okay with being present.   Diabetes: Patient presents for follow up of diabetes. Current symptoms include: hypoglycemia (twice - 69 this AM and also low once 2 weeks ago). Known diabetic complications: peripheral neuropathy and diabetic amyotrophy. Medication compliance: yes. Current diet: in general, a "healthy" diet  , decreased appetite. Current exercise: none. Home blood sugar records: 117-125 fasting. Is she  on ACE inhibitor or angiotensin II receptor blocker? No. Is she on a statin? Yes.   Lab Results  Component Value Date   HGBA1C 8.6 (H) 02/23/2020   HGBA1C 9.4 (H) 12/31/2019   HGBA1C 9.9 (H) 10/22/2019   Lab Results  Component Value Date   LDLCALC 81  06/29/2019   CREATININE 0.75 04/04/2020     New complaints: Patient is struggling with constipation. She is often going a week without having a bowel movement. The only time she is going is when she uses a Dulcolax suppository. She is taking Dulcolax 10 mg PO QD as well as magnesium 400 mg BID.   Social history:  Relevant past medical, surgical, family and social history reviewed and updated  as indicated. Interim medical history since our last visit reviewed.  Allergies and medications reviewed and updated.  DATA REVIEWED: CHART IN EPIC  ROS: Negative unless specifically indicated above in HPI.    Current Outpatient Medications:    acetaminophen (TYLENOL) 325 MG tablet, Take 1-2 tablets (325-650 mg total) by mouth every 4 (four) hours as needed for mild pain., Disp: , Rfl:    atorvastatin (LIPITOR) 10 MG tablet, Take 1 tablet (10 mg total) by mouth daily., Disp: 30 tablet, Rfl: 2   bisacodyl (BISACODYL) 5 MG EC tablet, Take 2 tablets (10 mg total) by mouth daily., Disp: 60 tablet, Rfl: 0   gabapentin (NEURONTIN) 600 MG tablet, Take 1 tablet (600 mg total) by mouth 5 (five) times daily. Take 1 tablet 3 x daily and  2 tablets at 8pm daily, Disp: 150 tablet, Rfl: 3   insulin glargine (LANTUS SOLOSTAR) 100 UNIT/ML Solostar Pen, Inject 20 Units into the skin 2 (two) times daily., Disp: 15 mL, Rfl: 0   Insulin Pen Needle (CAREFINE PEN NEEDLES) 31G X 6 MM MISC, 1 application by Does not apply route in the morning, at noon, in the evening, and at bedtime., Disp: 100 each, Rfl: 1   lidocaine (LIDODERM) 5 %, Place 2 patches onto the skin daily. Apply at 8 am and remove at 8 pm--purchase over the counter., Disp: 30 patch, Rfl: 0   magnesium oxide (MAG-OX) 400 (241.3 Mg) MG tablet, Take 1 tablet (400 mg total) by mouth 2 (two) times daily., Disp: 60 tablet, Rfl: 2   metFORMIN (GLUCOPHAGE-XR) 500 MG 24 hr tablet, Take 1 tablet (500 mg total) by mouth 2 (two) times daily with a meal., Disp: 60 tablet, Rfl: 2   methadone (DOLOPHINE) 10 MG tablet, Take 2 tablets (20 mg total) by mouth 3 (three) times daily., Disp: 180 tablet, Rfl: 0   nortriptyline (PAMELOR) 50 MG capsule, Take 1 capsule (50 mg total) by mouth at bedtime., Disp: 30 capsule, Rfl: 4   tapentadol HCl (NUCYNTA) 75 MG tablet, Take 1 tablet (75 mg total) by mouth every 12 (twelve) hours as needed., Disp: 60 tablet, Rfl: 0    tiZANidine (ZANAFLEX) 2 MG tablet, TAKE (1) TABLET EVERY SIX HOURS AS NEEDED FOR MUSCLE SPASMS. (Patient taking differently: Take 2 mg by mouth every 6 (six) hours as needed for muscle spasms. ), Disp: 60 tablet, Rfl: 1   topiramate (TOPAMAX) 50 MG tablet, TAKE 2 TABLETS IN THE MORNING AND 4 TABLETS AT BEDTIME., Disp: 180 tablet, Rfl: 0   Allergies  Allergen Reactions   Aspirin Anaphylaxis and Hives    Slurred speech, blurred vision- also   Hydromorphone Hcl Shortness Of Breath and Other (See Comments)    Severe shortness of breath   Vimpat [Lacosamide] Other (See Comments)    Hallucinations, out of body experience, confusion    Depakote [Divalproex Sodium] Other (See Comments)    Reaction not recalled by husband   Milnacipran Hcl Other (See Comments)    Dizziness    Pristiq [Desvenlafaxine Succinate Monohydrate] Other (See Comments)  Dizziness    Capsaicin Rash and Other (See Comments)    Red rash   Gabapentin Other (See Comments)    States leg pain worsened, pt d/c'd   Past Medical History:  Diagnosis Date   Arthritis    Cervical facet joint syndrome 04/22/2012   Cervicalgia    Chronic headaches    Dr Hermelinda [facility] [place] [facility]   Chronic nausea    normal GES    Complication of anesthesia    had problem with local anesthesia-tried to assist Physician   DM (diabetes mellitus) (HCC)    Elevated liver enzymes    hx of, normal 03/2010   GERD (gastroesophageal reflux disease) 05/31/2011   History Schatzki's ring, erosive reflux esophagitis, status post EGD and dilation 8/11 and 11/20   Glaucoma    lens in both eyes   Hemorrhoids 11/17/08   Colonoscopy Dr Karilyn Cota   Occipital neuralgia    PTSD (post-traumatic stress disorder)    Renal lithiasis    Seizure (HCC)    12 yrs ago-med related   Torticollis    Unspecified musculoskeletal disorders and symptoms referable to neck    cervical/trapezius    Past Surgical History:  Procedure Laterality Date    BREAST LUMPECTOMY     left   CATARACT EXTRACTION W/PHACO Left 10/07/2016   Procedure: CATARACT EXTRACTION PHACO AND INTRAOCULAR LENS PLACEMENT (IOC);  Surgeon: Gemma Payor, MD;  Location: [facility];  Service: Ophthalmology;  Laterality: Left;  CDE: 4.98   CATARACT EXTRACTION W/PHACO Right 10/21/2016   Procedure: CATARACT EXTRACTION PHACO AND INTRAOCULAR LENS PLACEMENT RIGHT EYE CDE - 6.24;  Surgeon: Gemma Payor, MD;  Location: [facility];  Service: Ophthalmology;  Laterality: Right;  right   CHOLECYSTECTOMY     COLONOSCOPY  11/17/08   ext hemorrhoids   CRANIOTOMY     secondary TBI   CYST EXCISION     left wrist   ESOPHAGOGASTRODUODENOSCOPY  04/05/10   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),erosive reflux esophagitis, small hiatal hernia,   ESOPHAGOGASTRODUODENOSCOPY (EGD) WITH PROPOFOL N/A 08/30/2019   Procedure: ESOPHAGOGASTRODUODENOSCOPY (EGD) WITH PROPOFOL;  Surgeon: Corbin Ade, MD;  Location: [facility];  Service: Endoscopy;  Laterality: N/A;  3:30pm - pt knows to arrive at 9:45   EXTRACORPOREAL SHOCK WAVE LITHOTRIPSY Left 12/13/2019   Procedure: EXTRACORPOREAL SHOCK WAVE LITHOTRIPSY (ESWL);  Surgeon: Jerilee Field, MD;  Location: [facility];  Service: Urology;  Laterality: Left;   FINGER SURGERY     removal cyst left pinky   gastro empy study  03/03/10   normal   HAND SURGERY     MALONEY DILATION N/A 08/30/2019   Procedure: Elease Hashimoto DILATION;  Surgeon: Corbin Ade, MD;  Location: [facility];  Service: Endoscopy;  Laterality: N/A;   NECK SURGERY  1998   NEPHROLITHOTOMY  05/13/2012   Procedure: NEPHROLITHOTOMY PERCUTANEOUS;  Surgeon: Marcine Matar, MD;  Location: [facility];  Service: Urology;  Laterality: Left;      SURAL NERVE BX Right 02/28/2020   Procedure: SURAL NERVE / Quadricep BIOPSY;  Surgeon: Tia Alert, MD;  Location: [facility] OR;  Service: Neurosurgery;  Laterality: Right;    Social History   Socioeconomic History   Marital status: Married     Spouse name: Not on file   Number of children: 3   Years of education: Not on file   Highest education level: Not on file  Occupational History   Occupation: disabled    Employer: UNEMPLOYED  Tobacco Use   Smoking status:  Never Smoker   Smokeless tobacco: Never Used  Vaping Use   Vaping Use: Never used  Substance and Sexual Activity   Alcohol use: No   Drug use: No   Sexual activity: Yes    Birth control/protection: Post-menopausal  Other Topics Concern   Not on file  Social History Narrative   Lives at home with husband    Right handed   Caffeine: none   Social Determinants of Health   Financial Resource Strain:    Difficulty of Paying Living Expenses: Not on file  Food Insecurity:    Worried About Programme researcher, broadcasting/film/video in the Last Year: Not on file   [employer] of Food in the Last Year: Not on file  Transportation Needs:    Lack of Transportation (Medical): Not on file   Lack of Transportation (Non-Medical): Not on file  Physical Activity:    Days of Exercise per Week: Not on file   Minutes of Exercise per Session: Not on file  Stress:    Feeling of Stress : Not on file  Social Connections:    Frequency of Communication with Friends and Family: Not on file   Frequency of Social Gatherings with Friends and Family: Not on file   Attends Religious Services: Not on file   Active Member of Clubs or Organizations: Not on file   Attends Banker Meetings: Not on file   Marital Status: Not on file  Intimate Partner Violence:    Fear of Current or Ex-Partner: Not on file   Emotionally Abused: Not on file   Physically Abused: Not on file   Sexually Abused: Not on file        Objective:    BP 108/68    Pulse 86    Temp (!) 97.3 F (36.3 C) (Temporal)    SpO2 97%   Wt Readings from Last 3 Encounters:  04/19/20 132 lb (59.9 kg)  03/10/20 133 lb 13.1 oz (60.7 kg)  02/24/20 132 lb 4.4 oz (60 kg)    Physical Exam Vitals reviewed.   Constitutional:      General: She is not in acute distress.    Appearance: Normal appearance. She is not ill-appearing, toxic-appearing or diaphoretic.  HENT:     Head: Normocephalic and atraumatic.  Eyes:     General: No scleral icterus.       Right eye: No discharge.        Left eye: No discharge.     Conjunctiva/sclera: Conjunctivae normal.  Cardiovascular:     Rate and Rhythm: Normal rate and regular rhythm.     Heart sounds: Normal heart sounds. No murmur heard.  No friction rub. No gallop.   Pulmonary:     Effort: Pulmonary effort is normal. No respiratory distress.     Breath sounds: Normal breath sounds. No stridor. No wheezing, rhonchi or rales.  Musculoskeletal:        General: Normal range of motion.     Cervical back: Normal range of motion.     Right lower leg: Edema present.     Left lower leg: Edema present.  Skin:    General: Skin is warm and dry.     Capillary Refill: Capillary refill takes less than 2 seconds.  Neurological:     General: No focal deficit present.     Mental Status: She is alert and oriented to person, place, and time. Mental status is at baseline.     Gait: [facility] abnormal [facility]  Psychiatric:        Attention and Perception: Attention and perception normal.        Mood and Affect: Mood is depressed. Affect is tearful.        Speech: Speech normal.        Behavior: Behavior normal.        Thought Content: Thought content normal.        Cognition and Memory: Cognition and memory normal.        Judgment: Judgment normal.    No results found for: TSH Lab Results  Component Value Date   WBC 6.8 04/04/2020   HGB 10.3 (L) 04/04/2020   HCT 32.6 (L) 04/04/2020   MCV 88 04/04/2020   PLT 233 04/04/2020   Lab Results  Component Value Date   NA 134 04/04/2020   K 4.5 04/04/2020   CO2 26 04/04/2020   GLUCOSE 194 (H) 04/04/2020   BUN 36 (H) 04/04/2020   CREATININE 0.75 04/04/2020   BILITOT <0.2 04/04/2020   ALKPHOS 202 (H)  04/04/2020   AST 10 04/04/2020   ALT 13 04/04/2020   PROT 6.7 04/04/2020   ALBUMIN 3.9 04/04/2020   CALCIUM 9.3 04/04/2020   ANIONGAP 9 03/20/2020   Lab Results  Component Value Date   CHOL 147 06/29/2019   Lab Results  Component Value Date   HDL 36 (L) 06/29/2019   Lab Results  Component Value Date   LDLCALC 81 06/29/2019   Lab Results  Component Value Date   TRIG 174 (H) 06/29/2019   Lab Results  Component Value Date   CHOLHDL 4.1 06/29/2019   Lab Results  Component Value Date   HGBA1C 8.6 (H) 02/23/2020

## 2020-05-04 NOTE — Patient Instructions (Signed)
Miralax 1 capful in 6-8 oz beverage of choice once daily as needed.   

## 2020-05-05 ENCOUNTER — Other Ambulatory Visit: Payer: Self-pay | Admitting: Physical Medicine & Rehabilitation

## 2020-05-05 DIAGNOSIS — G4486 Cervicogenic headache: Secondary | ICD-10-CM

## 2020-05-05 DIAGNOSIS — M7918 Myalgia, other site: Secondary | ICD-10-CM

## 2020-05-12 ENCOUNTER — Telehealth: Payer: Self-pay

## 2020-05-15 ENCOUNTER — Encounter: Payer: Self-pay | Admitting: Family Medicine

## 2020-05-18 ENCOUNTER — Other Ambulatory Visit: Payer: Self-pay | Admitting: Physical Medicine & Rehabilitation

## 2020-05-18 DIAGNOSIS — Z79891 Long term (current) use of opiate analgesic: Secondary | ICD-10-CM

## 2020-05-18 DIAGNOSIS — M7918 Myalgia, other site: Secondary | ICD-10-CM

## 2020-05-18 DIAGNOSIS — G43011 Migraine without aura, intractable, with status migrainosus: Secondary | ICD-10-CM

## 2020-05-18 DIAGNOSIS — Z79899 Other long term (current) drug therapy: Secondary | ICD-10-CM

## 2020-05-18 DIAGNOSIS — M47812 Spondylosis without myelopathy or radiculopathy, cervical region: Secondary | ICD-10-CM

## 2020-05-18 DIAGNOSIS — G43711 Chronic migraine without aura, intractable, with status migrainosus: Secondary | ICD-10-CM

## 2020-05-18 DIAGNOSIS — G4486 Cervicogenic headache: Secondary | ICD-10-CM

## 2020-05-18 DIAGNOSIS — G894 Chronic pain syndrome: Secondary | ICD-10-CM

## 2020-05-18 DIAGNOSIS — Z5181 Encounter for therapeutic drug level monitoring: Secondary | ICD-10-CM

## 2020-05-31 ENCOUNTER — Encounter: Payer: Self-pay | Admitting: Physical Medicine & Rehabilitation

## 2020-05-31 ENCOUNTER — Other Ambulatory Visit: Payer: Self-pay

## 2020-05-31 ENCOUNTER — Encounter: Payer: Medicare Other | Attending: Physical Medicine & Rehabilitation | Admitting: Physical Medicine & Rehabilitation

## 2020-05-31 VITALS — BP 120/60 | HR 76 | Temp 98.8°F | Ht 64.0 in

## 2020-05-31 DIAGNOSIS — E1144 Type 2 diabetes mellitus with diabetic amyotrophy: Secondary | ICD-10-CM | POA: Insufficient documentation

## 2020-05-31 DIAGNOSIS — G4486 Cervicogenic headache: Secondary | ICD-10-CM | POA: Diagnosis present

## 2020-05-31 DIAGNOSIS — G43711 Chronic migraine without aura, intractable, with status migrainosus: Secondary | ICD-10-CM | POA: Insufficient documentation

## 2020-05-31 DIAGNOSIS — R600 Localized edema: Secondary | ICD-10-CM

## 2020-05-31 MED ORDER — TAPENTADOL HCL 75 MG PO TABS: 75.0000 mg | ORAL_TABLET | Freq: Two times a day (BID) | ORAL | 0 refills | Status: DC | PRN

## 2020-05-31 MED ORDER — TAPENTADOL HCL 75 MG PO TABS
75.0000 mg | ORAL_TABLET | Freq: Two times a day (BID) | ORAL | 0 refills | Status: DC | PRN
Start: 2020-05-31 — End: 2020-05-31

## 2020-05-31 MED ORDER — METHADONE HCL 10 MG PO TABS: 20.0000 mg | ORAL_TABLET | Freq: Three times a day (TID) | ORAL | 0 refills | Status: DC

## 2020-05-31 NOTE — Progress Notes (Signed)
Subjective:    Patient ID: Monica [name], female    DOB: Feb 17, 1951, 69 y.o.   MRN: [id number]  HPI   Monica [name] is here in follow-up of her chronic pain and functional deficits.  She was diagnosed with diabetic amyotrophy based on clinical and electrodiagnostic findings, biopsies, etc.  She has been receiving home health therapy but they have backed off given her slow recovery.  She continues to have pain but seems to be making some progress with her pain levels since we last met.  Furthermore she has noticed some return beginning in her right lower extremity where her first symptoms presented.  Continues to have sensory loss in both legs as well as in the trunk and in the hands.  She still is essentially wheelchair-bound.  Her husband assist with sliding board transfers.  She is continent of bowel and bladder although is very constipated.  She was placed on Linzess which caused significant blowouts.  She stopped this medication.  Currently she is taking orally and suppository forms of Dulcolax.  She does feel bloated and distended still however.  Her other concern is ongoing edema which seems to have increased in both lower extremities.  She denies any discoloration or warmth in the limbs.  Patient is being followed by Dr. Jaynee Eagles here in [place]..  She made referrals for [place] to [facility] neuromuscular department for another opinion regarding her diabetic amyotrophy.  Her pain currently she remains on Topamax for headaches as well as tizanidine for muscle spasms.  She uses Nucynta and methadone for neuropathic and generalized pain.  Additionally she is on nortriptyline and gabapentin for her dysesthetic limb pain.  She reports that her diabetes is better controlled.   Pain Inventory Average Pain 7 Pain Right Now 5 My pain is constant, burning, stabbing, tingling and aching  In the last 24 hours, has pain interfered with the following? General activity 10 Relation with others  10 Enjoyment of life 10 What TIME of day is your pain at its worst? night Sleep (in general) Poor  Pain is worse with: walking, standing and some activites Pain improves with: heat/ice and medication Relief from Meds: 5  Family History  Problem Relation Age of Onset  . Colon cancer Mother 16  . Heart disease Father   . Diabetes Father   . Diabetes Paternal Grandmother   . Heart disease Paternal Grandfather   . Diverticulitis Daughter    Social History   Socioeconomic History  . Marital status: Married    Spouse name: Not on file  . Number of children: 3  . Years of education: Not on file  . Highest education level: Not on file  Occupational History  . Occupation: disabled    Fish farm manager: UNEMPLOYED  Tobacco Use  . Smoking status: Never Smoker  . Smokeless tobacco: Never Used  Vaping Use  . Vaping Use: Never used  Substance and Sexual Activity  . Alcohol use: No  . Drug use: No  . Sexual activity: Yes    Birth control/protection: Post-menopausal  Other Topics Concern  . Not on file  Social History Narrative   Lives at home with husband    Right handed   Caffeine: none   Social Determinants of Health   Financial Resource Strain:   . Difficulty of Paying Living Expenses: Not on file  Food Insecurity:   . Worried About Charity fundraiser in the Last Year: Not on file  . Ran Out of Food in  the Last Year: Not on file  Transportation Needs:   . Lack of Transportation (Medical): Not on file  . Lack of Transportation (Non-Medical): Not on file  Physical Activity:   . Days of Exercise per Week: Not on file  . Minutes of Exercise per Session: Not on file  Stress:   . Feeling of Stress : Not on file  Social Connections:   . Frequency of Communication with Friends and Family: Not on file  . Frequency of Social Gatherings with Friends and Family: Not on file  . Attends Religious Services: Not on file  . Active Member of Clubs or Organizations: Not on file  . Attends  Archivist Meetings: Not on file  . Marital Status: Not on file   Past Surgical History:  Procedure Laterality Date  . BREAST LUMPECTOMY     left  . CATARACT EXTRACTION W/PHACO Left 10/07/2016   Procedure: CATARACT EXTRACTION PHACO AND INTRAOCULAR LENS PLACEMENT (IOC);  Surgeon: Tonny Branch, MD;  Location: [facility];  Service: Ophthalmology;  Laterality: Left;  CDE: 4.98  . CATARACT EXTRACTION W/PHACO Right 10/21/2016   Procedure: CATARACT EXTRACTION PHACO AND INTRAOCULAR LENS PLACEMENT RIGHT EYE CDE - 6.24;  Surgeon: Tonny Branch, MD;  Location: [facility];  Service: Ophthalmology;  Laterality: Right;  right  . CHOLECYSTECTOMY    . COLONOSCOPY  11/17/08   ext hemorrhoids  . CRANIOTOMY     secondary TBI  . CYST EXCISION     left wrist  . ESOPHAGOGASTRODUODENOSCOPY  04/05/10   [name] (46F),erosive reflux esophagitis, small hiatal hernia,  . ESOPHAGOGASTRODUODENOSCOPY (EGD) WITH PROPOFOL N/A 08/30/2019   Procedure: ESOPHAGOGASTRODUODENOSCOPY (EGD) WITH PROPOFOL;  Surgeon: Daneil Dolin, MD;  [facility];  Service: Endoscopy;  Laterality: N/A;  3:30pm - pt knows to arrive at 9:45  . EXTRACORPOREAL SHOCK WAVE LITHOTRIPSY Left 12/13/2019   Procedure: EXTRACORPOREAL SHOCK WAVE LITHOTRIPSY (ESWL);  Surgeon: Festus Aloe, MD;  Location: [facility];  Service: Urology;  Laterality: Left;  . FINGER SURGERY     removal cyst left pinky  . gastro empy study  03/03/10   normal  . HAND SURGERY    . MALONEY DILATION N/A 08/30/2019   Procedure: Venia Minks DILATION;  Surgeon: Daneil Dolin, MD;  [facility];  Service: Endoscopy;  Laterality: N/A;  . NECK SURGERY  1998  . NEPHROLITHOTOMY  05/13/2012   Procedure: NEPHROLITHOTOMY PERCUTANEOUS;  Surgeon: Franchot Gallo, MD;  Location: [facility];  Service: Urology;  Laterality: Left;     . SURAL NERVE BX Right 02/28/2020   Procedure: SURAL NERVE / Quadricep BIOPSY;  Surgeon: Eustace Moore, MD;  Location:  [place];  Service: Neurosurgery;  Laterality: Right;   Past Surgical History:  Procedure Laterality Date  . BREAST LUMPECTOMY     left  . CATARACT EXTRACTION W/PHACO Left 10/07/2016   Procedure: CATARACT EXTRACTION PHACO AND INTRAOCULAR LENS PLACEMENT (IOC);  Surgeon: Tonny Branch, MD;  [facility];  Service: Ophthalmology;  Laterality: Left;  CDE: 4.98  . CATARACT EXTRACTION W/PHACO Right 10/21/2016   Procedure: CATARACT EXTRACTION PHACO AND INTRAOCULAR LENS PLACEMENT RIGHT EYE CDE - 6.24;  Surgeon: Tonny Branch, MD;  Location: [facility];  Service: Ophthalmology;  Laterality: Right;  right  . CHOLECYSTECTOMY    . COLONOSCOPY  11/17/08   ext hemorrhoids  . CRANIOTOMY     secondary TBI  . CYST EXCISION     left wrist  . ESOPHAGOGASTRODUODENOSCOPY  04/05/10  [name] (50F),erosive reflux esophagitis, small hiatal hernia,  . ESOPHAGOGASTRODUODENOSCOPY (EGD) WITH PROPOFOL N/A 08/30/2019   Procedure: ESOPHAGOGASTRODUODENOSCOPY (EGD) WITH PROPOFOL;  Surgeon: Corbin Ade, MD;  [facility];  Service: Endoscopy;  Laterality: N/A;  3:30pm - pt knows to arrive at 9:45  . EXTRACORPOREAL SHOCK WAVE LITHOTRIPSY Left 12/13/2019   Procedure: EXTRACORPOREAL SHOCK WAVE LITHOTRIPSY (ESWL);  Surgeon: Jerilee Field, MD;  Location: [facility];  Service: Urology;  Laterality: Left;  . FINGER SURGERY     removal cyst left pinky  . gastro empy study  03/03/10   normal  . HAND SURGERY    . MALONEY DILATION N/A 08/30/2019   Procedure: Elease Hashimoto DILATION;  Surgeon: Corbin Ade, MD;  [facility];  Service: Endoscopy;  Laterality: N/A;  . NECK SURGERY  1998  . NEPHROLITHOTOMY  05/13/2012   Procedure: NEPHROLITHOTOMY PERCUTANEOUS;  Surgeon: Marcine Matar, MD;  [facility];  Service: Urology;  Laterality: Left;     . SURAL NERVE BX Right 02/28/2020   Procedure: SURAL NERVE / Quadricep BIOPSY;  Surgeon: Tia Alert, MD;  Location: [facility];  Service:  Neurosurgery;  Laterality: Right;   Past Medical History:  Diagnosis Date  . Arthritis   . Cervical facet joint syndrome 04/22/2012  . Cervicalgia   . Chronic headaches    Dr Hermelinda Medicus [place] [facility]  . Chronic nausea    normal GES   . Complication of anesthesia    had problem with local anesthesia-tried to assist Physician  . DM (diabetes mellitus) (HCC)   . Elevated liver enzymes    hx of, normal 03/2010  . GERD (gastroesophageal reflux disease) 05/31/2011   History Schatzki's ring, erosive reflux esophagitis, status post EGD and dilation 8/11 and 11/20  . Glaucoma    lens in both eyes  . Hemorrhoids 11/17/08   Colonoscopy Dr Karilyn Cota  . Occipital neuralgia   . PTSD (post-traumatic stress disorder)   . Renal lithiasis   . 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 Torticollis   . Unspecified musculoskeletal disorders and symptoms referable to neck    cervical/trapezius   BP 120/60   Pulse 76   Temp 98.8 F (37.1 C)   Ht 5\' 4"  (1.626 m)   SpO2 96%   BMI 22.66 kg/m   Opioid Risk Score:   Fall Risk Score:  `1  Depression screen PHQ 2/9  Depression screen [facility] 2/9 05/04/2020 04/04/2020 01/11/2020 10/29/2019 10/22/2019 07/16/2019 06/29/2019  Decreased Interest 0 0 0 0 0 0 1  Down, Depressed, Hopeless 0 3 1 0 0 0 1  PHQ - 2 Score 0 3 1 0 0 0 2  Altered sleeping - 3 - - - - 3  Tired, decreased energy - 3 - - - - 2  Change in appetite - 3 - - - - 3  Feeling bad or failure about yourself  - 0 - - - - 1  Trouble concentrating - 3 - - - - 3  Moving slowly or fidgety/restless - 0 - - - - 2  Suicidal thoughts - 1 - - - - 0  PHQ-9 Score - 16 - - - - 16  Difficult doing work/chores - - - - - - Not difficult at all  Some recent data might be hidden    Review of Systems  Constitutional: Negative.   HENT: Negative.   Eyes: Negative.   Respiratory: Negative.   Cardiovascular: Positive  for leg swelling.  Gastrointestinal: Negative.   Endocrine: Negative.   Genitourinary:  Negative.   Musculoskeletal: Positive for arthralgias, back pain and gait problem.  Skin: Negative.   Allergic/Immunologic: Negative.   Neurological: Positive for weakness and numbness.       Tingling   Hematological: Negative.   Psychiatric/Behavioral: Negative.   All other systems reviewed and are negative.      Objective:   Physical Exam  Gen: in distress d/t pain, normal appearing HEENT: oral mucosa pink and moist, NCAT Cardio: Reg rate Chest: normal effort, normal rate of breathing Abd: soft, non-distended Ext: Trace edema Skin: warm. Neuro: Upper extremity motor is 5 out of 5.  RLE: 1+/5 LLE 0-tr/5.   Reflexes are absent. Decreased sensory in hands as well as from mid trunk to LE's although her responses were somewhat inconsistent.    Musculoskeletal: Further lower extremity muscle wasting Psych: tearful d/t pain          Assessment & Plan:  1.  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  Patient still with significant pain in both legs right more than left with associated motor and sensory loss.  -both legs eventually involved but right leg beginning to demonstrate signs of improvement.  2.  Chronic migraine headaches 3.  Chronic cervical spondylosis with facet arthropathy and associated cervicalgia and headaches 4.  Diabetes type II: Still inadequately controlled 5. Bilateral LE edema. Low index of suspicion for DVT but it is new 6.  Constipation/ neurogenic bowel   Plan:  1 HEP until more neurological return in le's.  Will transition back to home health therapy versus outpatient at that time 2.  Refilled her morphine 20 mg 3 times daily per baseline regimen             We will continue the controlled substance monitoring program, this consists of regular clinic visits, examinations, routine drug screening, pill counts as well as use of [place] Controlled Substance Reporting System. [place] today.    -Medication was refilled and a  second prescription was sent to the patient's pharmacy for next month.  .   3.  continue Nucynta 75 mg every 12 hours as needed to better control neuropathic pain.  Refill today 4.  maintain gabapentin to 600 mg 3 times daily and 1200 mg in the evening for neuropathic pain symptoms 5.    Maintain nortriptyline   50 mg at bedtime 6.  neurology assessment pending at [facility]. 7. Check LE venous dopplers.  8. Discussed bowel program with bid miralax, hs senna-s and am suppository    15 minutes of face to face patient care time were spent during this visit. All questions were encouraged and answered. Follow up with me in 2 mos.

## 2020-05-31 NOTE — Patient Instructions (Addendum)
PLEASE FEEL FREE TO CALL OUR OFFICE WITH ANY PROBLEMS OR QUESTIONS [phone])   BOWEL PROGRAM  AM: DULCOLAX SUPPOSITORY  PM: SENOKOT-COLACE 2 TABS   TWICE DAILY MIRALAX 16G

## 2020-06-05 DIAGNOSIS — Z886 Allergy status to analgesic agent status: Secondary | ICD-10-CM | POA: Diagnosis not present

## 2020-06-05 DIAGNOSIS — R531 Weakness: Secondary | ICD-10-CM | POA: Diagnosis not present

## 2020-06-05 DIAGNOSIS — G629 Polyneuropathy, unspecified: Secondary | ICD-10-CM | POA: Diagnosis not present

## 2020-06-05 DIAGNOSIS — F40240 Claustrophobia: Secondary | ICD-10-CM | POA: Diagnosis not present

## 2020-06-05 DIAGNOSIS — Z888 Allergy status to other drugs, medicaments and biological substances status: Secondary | ICD-10-CM | POA: Diagnosis not present

## 2020-06-05 DIAGNOSIS — E1140 Type 2 diabetes mellitus with diabetic neuropathy, unspecified: Secondary | ICD-10-CM | POA: Diagnosis not present

## 2020-06-07 ENCOUNTER — Telehealth: Payer: Self-pay

## 2020-06-07 NOTE — Telephone Encounter (Signed)
Per [facility] RN:   Patient had a fall yesterday while transferring from her wheelchair. After the fall  she was seen by her PCP for swelling in both knees . Advised to use heat/ice for swelling & discomfort.   Call back ph [phone].

## 2020-06-07 NOTE — Telephone Encounter (Signed)
Home Health advised per [name]. Task completed.

## 2020-06-07 NOTE — Telephone Encounter (Signed)
Sounds as if this was managed appropriately. If she has any further swelling or complications, she should feel free to contact our office. thx

## 2020-06-09 DIAGNOSIS — Z20822 Contact with and (suspected) exposure to covid-19: Secondary | ICD-10-CM | POA: Diagnosis not present

## 2020-06-09 DIAGNOSIS — R197 Diarrhea, unspecified: Secondary | ICD-10-CM | POA: Diagnosis not present

## 2020-06-09 DIAGNOSIS — E860 Dehydration: Secondary | ICD-10-CM | POA: Diagnosis not present

## 2020-06-09 DIAGNOSIS — E1165 Type 2 diabetes mellitus with hyperglycemia: Secondary | ICD-10-CM | POA: Diagnosis not present

## 2020-06-09 DIAGNOSIS — R531 Weakness: Secondary | ICD-10-CM | POA: Diagnosis not present

## 2020-06-09 DIAGNOSIS — G4489 Other headache syndrome: Secondary | ICD-10-CM | POA: Diagnosis not present

## 2020-06-09 DIAGNOSIS — A419 Sepsis, unspecified organism: Secondary | ICD-10-CM | POA: Diagnosis not present

## 2020-06-13 DIAGNOSIS — M6281 Muscle weakness (generalized): Secondary | ICD-10-CM | POA: Diagnosis not present

## 2020-06-13 DIAGNOSIS — K5900 Constipation, unspecified: Secondary | ICD-10-CM | POA: Diagnosis not present

## 2020-06-13 DIAGNOSIS — F339 Major depressive disorder, recurrent, unspecified: Secondary | ICD-10-CM | POA: Diagnosis not present

## 2020-06-13 DIAGNOSIS — K219 Gastro-esophageal reflux disease without esophagitis: Secondary | ICD-10-CM | POA: Diagnosis not present

## 2020-06-13 DIAGNOSIS — G43909 Migraine, unspecified, not intractable, without status migrainosus: Secondary | ICD-10-CM | POA: Diagnosis not present

## 2020-06-13 DIAGNOSIS — R2689 Other abnormalities of gait and mobility: Secondary | ICD-10-CM | POA: Diagnosis not present

## 2020-06-13 DIAGNOSIS — R41841 Cognitive communication deficit: Secondary | ICD-10-CM | POA: Diagnosis not present

## 2020-06-13 DIAGNOSIS — E119 Type 2 diabetes mellitus without complications: Secondary | ICD-10-CM | POA: Diagnosis not present

## 2020-06-13 DIAGNOSIS — G6181 Chronic inflammatory demyelinating polyneuritis: Secondary | ICD-10-CM | POA: Diagnosis not present

## 2020-06-13 DIAGNOSIS — R339 Retention of urine, unspecified: Secondary | ICD-10-CM | POA: Diagnosis not present

## 2020-06-13 DIAGNOSIS — G8929 Other chronic pain: Secondary | ICD-10-CM | POA: Diagnosis not present

## 2020-06-14 ENCOUNTER — Ambulatory Visit: Payer: Medicare Other | Admitting: Physical Medicine & Rehabilitation

## 2020-06-20 ENCOUNTER — Telehealth: Payer: Self-pay | Admitting: *Deleted

## 2020-06-20 DIAGNOSIS — G43711 Chronic migraine without aura, intractable, with status migrainosus: Secondary | ICD-10-CM

## 2020-06-20 DIAGNOSIS — G4486 Cervicogenic headache: Secondary | ICD-10-CM

## 2020-06-20 NOTE — Telephone Encounter (Signed)
Merry Proud, RN, [facility] called to inform that patient is inpatient at their facility in [place], [place].  She says Dr. Olena Leatherwood is not willing to continue methadone at her current sig.  Nurse states that if patient is going to stay on same dose they would need a script faxed from [name] to their nurse station at fax# [phone] or [name] could all it into their dispensing pharmacy at [facility] at [phone]

## 2020-06-21 MED ORDER — METHADONE HCL 10 MG PO TABS: 20.0000 mg | ORAL_TABLET | Freq: Three times a day (TID) | ORAL | 0 refills | Status: DC

## 2020-06-21 NOTE — Telephone Encounter (Signed)
A new methadone rx was written so that husband can take rx to facility where she's at. If it's too early to fill, he may bring her existing rx to the facility for them to dispense.

## 2020-06-21 NOTE — Addendum Note (Signed)
Addended by: Faith Rogue T on: 06/21/2020 09:14 AM   Modules accepted: Orders

## 2020-06-27 ENCOUNTER — Telehealth: Payer: Self-pay

## 2020-06-28 ENCOUNTER — Ambulatory Visit: Payer: Medicare Other | Admitting: Family

## 2020-06-29 ENCOUNTER — Telehealth: Payer: Self-pay | Admitting: *Deleted

## 2020-06-29 NOTE — Telephone Encounter (Signed)
FYI: Vm from Jasmine December w/ [facility] She went to do start of care yesterday, she has an unsafe living environment, her husband is overwhelmed with taking care of her. She did not want to go back to the ED again. They will not be placing her with [facility] services d/t non copliance and living conditions. Pt has an appt on 07/04/20, home health suggests possible placement to get the help she needs

## 2020-06-30 NOTE — Telephone Encounter (Signed)
TC today regarding pt's eval done for [facility] services Pt needs a higher level of care than HH can provide In reference to 'unsafe living environment', this is in regards to caregiver has been giving an increased amount of insulin, they are not able to see her more frequently to assist with this

## 2020-07-03 NOTE — Telephone Encounter (Signed)
When you have time can you and I go over this patients visit, to clarify some of the needs before visit tomorrow. Thanks

## 2020-07-04 ENCOUNTER — Telehealth (INDEPENDENT_AMBULATORY_CARE_PROVIDER_SITE_OTHER): Payer: Medicare Other | Admitting: Nurse Practitioner

## 2020-07-04 ENCOUNTER — Encounter: Payer: Self-pay | Admitting: Nurse Practitioner

## 2020-07-04 DIAGNOSIS — Z09 Encounter for follow-up examination after completed treatment for conditions other than malignant neoplasm: Secondary | ICD-10-CM | POA: Insufficient documentation

## 2020-07-04 DIAGNOSIS — E1142 Type 2 diabetes mellitus with diabetic polyneuropathy: Secondary | ICD-10-CM | POA: Diagnosis not present

## 2020-07-04 DIAGNOSIS — Z794 Long term (current) use of insulin: Secondary | ICD-10-CM | POA: Diagnosis not present

## 2020-07-04 NOTE — Assessment & Plan Note (Signed)
Patient diabetes is not well controlled on current medication dose, I spent over 25 minutes speaking with patient and caregiver (spouse) providing education on medication administration, spouse wasn't sure how to administer Humalog and the signs and symptoms of hypo and hyper glycemia. Patient will need a higher level of care.

## 2020-07-04 NOTE — Assessment & Plan Note (Signed)
Completed hospital follow up discharge instructions with medication reconciliation completed. Patient's spouse verbalize understanding.

## 2020-07-04 NOTE — Telephone Encounter (Signed)
Pt was not admitted to services, there was not a skilled nursing need, therefore we can not add SW to help with the placement to a facility. She will check with the Hospice side to see if there is any help that they can give.

## 2020-07-04 NOTE — Progress Notes (Signed)
Virtual Visit via Video Note   This visit type was conducted due to national recommendations for restrictions regarding the COVID-19 Pandemic (e.g. social distancing) in an effort to limit this patient's exposure and mitigate transmission in our community.  Due to her co-morbid illnesses, this patient is at least at moderate risk for complications without adequate follow up.  This format is felt to be most appropriate for this patient at this time.  All issues noted in this document were discussed and addressed.  A limited physical exam was performed with this format.  A verbal consent was obtained for the virtual visit.   Date:  07/04/2020   ID:  Monica, [name] 1950-09-13, MRN [id number]  Patient Location: Home Provider Location: Office/Clinic  PCP:  [name], FNP   Evaluation Performed:  Follow-Up Visit  Chief Complaint: Follow-up after hospital discharge and evaluating patient's increased healthcare needs.  History of Present Illness:    Monica [name] is a 69 y.o. female she was recently admitted to the 06/09/2020 hospital with dehydration, diarrhea, elevated lactic acid level, generalized weakness sepsis due to unspecified organism.  She was discharged 06/13/2020.  History of diabetes,   The patient does not have symptoms concerning for COVID-19 infection (fever, chills, cough, or new shortness of breath).    Past Medical History:  Diagnosis Date  . Arthritis   . Cervical facet joint syndrome 04/22/2012  . Cervicalgia   . Chronic headaches    Dr Hermelinda [facility] [place] [facility]  . Chronic nausea    normal GES   . Complication of anesthesia    had problem with local anesthesia-tried to assist Physician  . DM (diabetes mellitus) (HCC)   . Elevated liver enzymes    hx of, normal 03/2010  . GERD (gastroesophageal reflux disease) 05/31/2011   History Schatzki's ring, erosive reflux esophagitis, status post EGD and dilation 8/11 and 11/20  . Glaucoma    lens in  both eyes  . Hemorrhoids 11/17/08   Colonoscopy Dr Karilyn Cota  . Occipital neuralgia   . PTSD (post-traumatic stress disorder)   . Renal lithiasis   . 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 Torticollis   . Unspecified musculoskeletal disorders and symptoms referable to neck    cervical/trapezius    Past Surgical History:  Procedure Laterality Date  . BREAST LUMPECTOMY     left  . CATARACT EXTRACTION W/PHACO Left 10/07/2016   Procedure: CATARACT EXTRACTION PHACO AND INTRAOCULAR LENS PLACEMENT (IOC);  Surgeon: Gemma Payor, MD;  Location: [facility];  Service: Ophthalmology;  Laterality: Left;  CDE: 4.98  . CATARACT EXTRACTION W/PHACO Right 10/21/2016   Procedure: CATARACT EXTRACTION PHACO AND INTRAOCULAR LENS PLACEMENT RIGHT EYE CDE - 6.24;  Surgeon: Gemma Payor, MD;  Location: [facility];  Service: Ophthalmology;  Laterality: Right;  right  . CHOLECYSTECTOMY    . COLONOSCOPY  11/17/08   ext hemorrhoids  . CRANIOTOMY     secondary TBI  . CYST EXCISION     left wrist  . ESOPHAGOGASTRODUODENOSCOPY  04/05/10   [name] (32F),erosive reflux esophagitis, small hiatal hernia,  . ESOPHAGOGASTRODUODENOSCOPY (EGD) WITH PROPOFOL N/A 08/30/2019   Procedure: ESOPHAGOGASTRODUODENOSCOPY (EGD) WITH PROPOFOL;  Surgeon: Corbin Ade, MD;  Location: [facility];  Service: Endoscopy;  Laterality: N/A;  3:30pm - pt knows to arrive at 9:45  . EXTRACORPOREAL SHOCK WAVE LITHOTRIPSY Left 12/13/2019   Procedure: EXTRACORPOREAL SHOCK WAVE LITHOTRIPSY (ESWL);  Surgeon: Jerilee Field, MD;  Location: [facility];  Service: Urology;  Laterality: Left;  . FINGER SURGERY     removal cyst left pinky  . gastro empy study  03/03/10   normal  . HAND SURGERY    . MALONEY DILATION N/A 08/30/2019   Procedure: Elease Hashimoto DILATION;  Surgeon: Corbin Ade, MD;  Location: [facility];  Service: Endoscopy;  Laterality: N/A;  . NECK SURGERY  1998  . NEPHROLITHOTOMY  05/13/2012   Procedure: NEPHROLITHOTOMY  PERCUTANEOUS;  Surgeon: Marcine Matar, MD;  Location: [facility];  Service: Urology;  Laterality: Left;     . SURAL NERVE BX Right 02/28/2020   Procedure: SURAL NERVE / Quadricep BIOPSY;  Surgeon: Tia Alert, MD;  Location: [facility];  Service: Neurosurgery;  Laterality: Right;    Family History  Problem Relation Age of Onset  . Colon cancer Mother 48  . Heart disease Father   . Diabetes Father   . Diabetes Paternal Grandmother   . Heart disease Paternal Grandfather   . Diverticulitis Daughter     Social History   Socioeconomic History  . Marital status: Married    Spouse name: Not on file  . Number of children: 3  . Years of education: Not on file  . Highest education level: Not on file  Occupational History  . Occupation: disabled    Associate Professor: UNEMPLOYED  Tobacco Use  . Smoking status: Never Smoker  . Smokeless tobacco: Never Used  Vaping Use  . Vaping Use: Never used  Substance and Sexual Activity  . Alcohol use: No  . Drug use: No  . Sexual activity: Yes    Birth control/protection: Post-menopausal  Other Topics Concern  . Not on file  Social History Narrative   Lives at home with husband    Right handed   Caffeine: none   Social Determinants of Health   Financial Resource Strain:   . Difficulty of Paying Living Expenses: Not on file  Food Insecurity:   . Worried About Programme researcher, broadcasting/film/video in the Last Year: Not on file  . Ran Out of Food in the Last Year: Not on file  Transportation Needs:   . Lack of Transportation (Medical): Not on file  . Lack of Transportation (Non-Medical): Not on file  Physical Activity:   . Days of Exercise per Week: Not on file  . Minutes of Exercise per Session: Not on file  Stress:   . Feeling of Stress : Not on file  Social Connections:   . Frequency of Communication with Friends and Family: Not on file  . Frequency of Social Gatherings with Friends and Family: Not on file  . Attends Religious Services: Not on file  . Active  Member of Clubs or Organizations: Not on file  . Attends Banker Meetings: Not on file  . Marital Status: Not on file  Intimate Partner Violence:   . Fear of Current or Ex-Partner: Not on file  . Emotionally Abused: Not on file  . Physically Abused: Not on file  . Sexually Abused: Not on file    Outpatient Medications Prior to Visit  Medication Sig Dispense Refill  . acetaminophen (TYLENOL) 325 MG tablet Take 1-2 tablets (325-650 mg total) by mouth every 4 (four) hours as needed for mild pain.    Marland Kitchen atorvastatin (LIPITOR) 10 MG tablet Take 1 tablet (10 mg total) by mouth daily. 90 tablet 1  . bisacodyl (BISACODYL) 5 MG EC tablet Take 2 tablets (10 mg total)  by mouth daily. 60 tablet 0  . gabapentin (NEURONTIN) 600 MG tablet Take 1 tablet (600 mg total) by mouth 5 (five) times daily. Take 1 tablet 3 x daily and  2 tablets at 8pm daily 150 tablet 3  . insulin glargine (LANTUS SOLOSTAR) 100 UNIT/ML Solostar Pen Inject 20 Units into the skin 2 (two) times daily. 15 mL 0  . Insulin Pen Needle (CAREFINE PEN NEEDLES) 31G X 6 MM MISC 1 application by Does not apply route in the morning, at noon, in the evening, and at bedtime. 100 each 1  . lidocaine (LIDODERM) 5 % Place 2 patches onto the skin daily. Apply at 8 am and remove at 8 pm--purchase over the counter. 30 patch 0  . linaclotide (LINZESS) 145 MCG CAPS capsule Take 1 capsule (145 mcg total) by mouth daily before breakfast. 90 capsule 1  . magnesium oxide (MAG-OX) 400 (241.3 Mg) MG tablet Take 1 tablet (400 mg total) by mouth 2 (two) times daily. 60 tablet 2  . metFORMIN (GLUCOPHAGE-XR) 500 MG 24 hr tablet Take 1 tablet (500 mg total) by mouth 2 (two) times daily with a meal. 180 tablet 1  . methadone (DOLOPHINE) 10 MG tablet Take 2 tablets (20 mg total) by mouth 3 (three) times daily. 180 tablet 0  . nortriptyline (PAMELOR) 50 MG capsule Take 1 capsule (50 mg total) by mouth at bedtime. 30 capsule 4  . tapentadol (NUCYNTA) 50 MG  tablet Take 50 mg by mouth.    . tapentadol HCl (NUCYNTA) 75 MG tablet Take 1 tablet (75 mg total) by mouth every 12 (twelve) hours as needed. 60 tablet 0  . tiZANidine (ZANAFLEX) 2 MG tablet TAKE (1) TABLET EVERY SIX HOURS AS NEEDED FOR MUSCLE SPASMS. 60 tablet 2  . topiramate (TOPAMAX) 50 MG tablet TAKE 2 TABLETS IN THE MORNING AND 4 TABLETS AT BEDTIME. 180 tablet 2   No facility-administered medications prior to visit.    Allergies:   Aspirin, Hydromorphone hcl, Vimpat [lacosamide], Depakote [divalproex sodium], Milnacipran hcl, Pristiq [desvenlafaxine succinate monohydrate], Capsaicin, and Gabapentin   Social History   Tobacco Use  . Smoking status: Never Smoker  . Smokeless tobacco: Never Used  Vaping Use  . Vaping Use: Never used  Substance Use Topics  . Alcohol use: No  . Drug use: No     Review of Systems  Constitutional: Negative for chills and fever.  Neurological: Positive for weakness. Negative for dizziness and headaches.  All other systems reviewed and are negative.    Labs/Other Tests and Data Reviewed:    Recent Labs: 04/04/2020: ALT 13; BUN 36; Creatinine, Ser 0.75; Hemoglobin 10.3; Magnesium 1.9; Platelets 233; Potassium 4.5; Sodium 134   Recent Lipid Panel Lab Results  Component Value Date/Time   CHOL 147 06/29/2019 03:24 PM   TRIG 174 (H) 06/29/2019 03:24 PM   HDL 36 (L) 06/29/2019 03:24 PM   CHOLHDL 4.1 06/29/2019 03:24 PM   LDLCALC 81 06/29/2019 03:24 PM    Wt Readings from Last 3 Encounters:  04/19/20 132 lb (59.9 kg)  03/10/20 133 lb 13.1 oz (60.7 kg)  02/24/20 132 lb 4.4 oz (60 kg)     Objective:    Vital Signs:  There were no vitals taken for this visit.   Physical Exam : Virtual visit  ASSESSMENT & PLAN:   1. Hospital discharge follow-up  2. Type 2 diabetes mellitus with diabetic polyneuropathy, with long-term current use of insulin (HCC)  Type 2 diabetes mellitus with diabetic polyneuropathy,  with long-term current use of insulin  (HCC) Patient diabetes is not well controlled on current medication dose, I spent over 25 minutes speaking with patient and caregiver (spouse) providing education on medication administration, spouse wasn't sure how to administer Humalog and the signs and symptoms of hypo and hyper glycemia. Patient will need a higher level of care.  Hospital discharge follow-up Completed hospital follow up discharge instructions with medication reconciliation completed. Patient's spouse verbalize understanding.    COVID-19 Education: The signs and symptoms of COVID-19 were discussed with the patient and how to seek care for testing (follow up with PCP or arrange E-visit). The importance of social distancing was discussed today.   I spent 25 minutes providing education on medication administration and patient care.  Follow Up:  Virtual Visit  prn  Signed, Daryll Drown, NP  07/04/2020 7:15 PM    [facility]

## 2020-07-08 IMAGING — DX DG KNEE COMPLETE 4+V*R*
4 series · 4 of 4 positions shown · non-contrast
Comparison: None.

CLINICAL DATA: Right knee pain for several months, history of
multiple falls, initial encounter

EXAM:
RIGHT KNEE - COMPLETE 4+ VIEW

[knee ap]
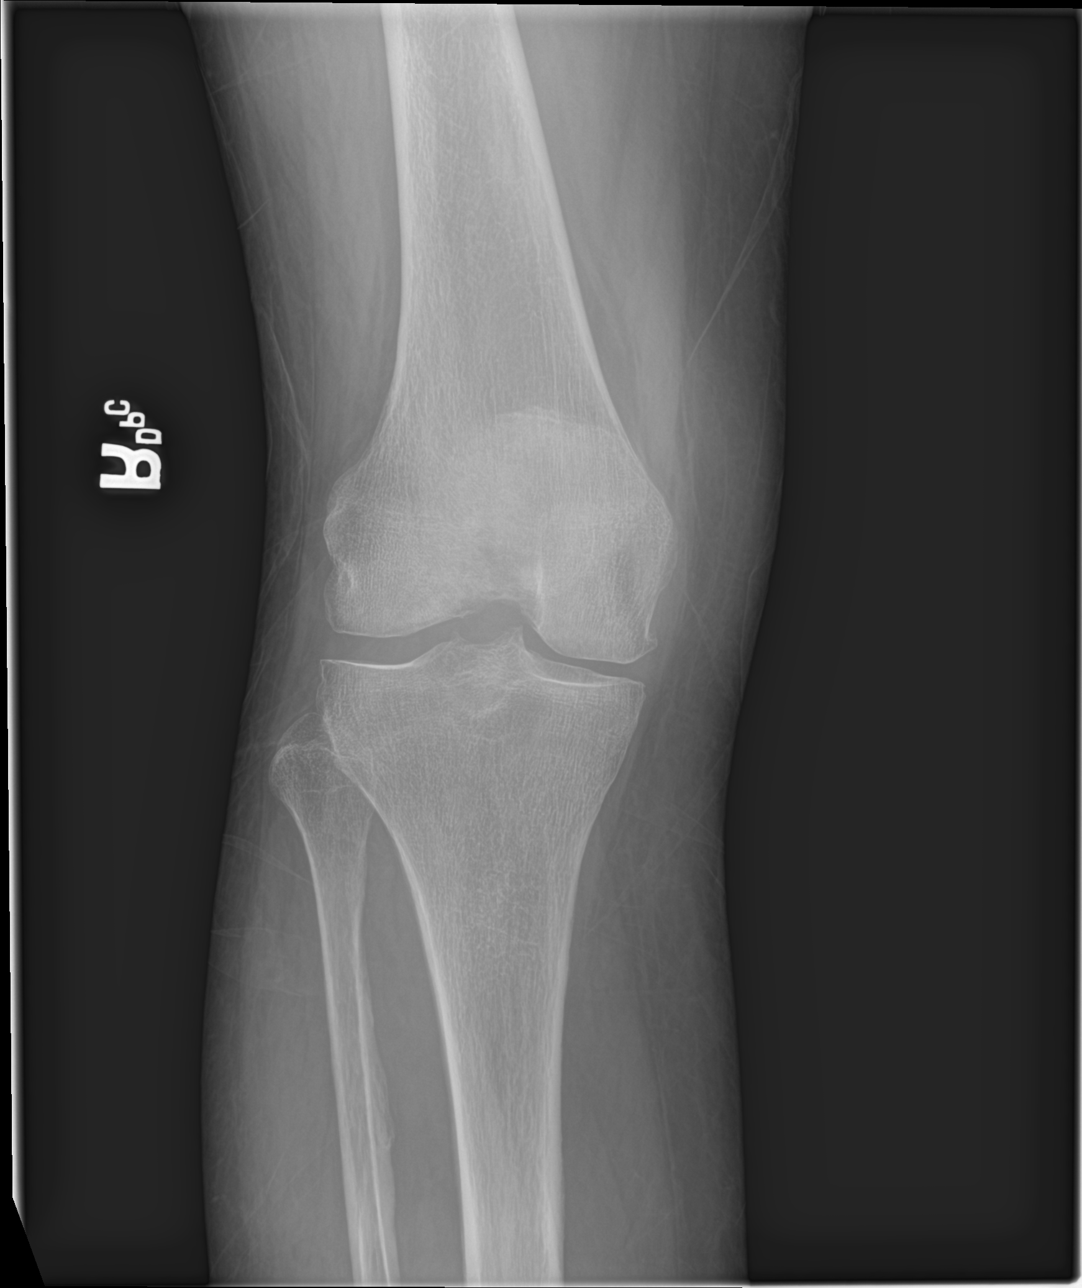

[knee obl (1 of 2)]
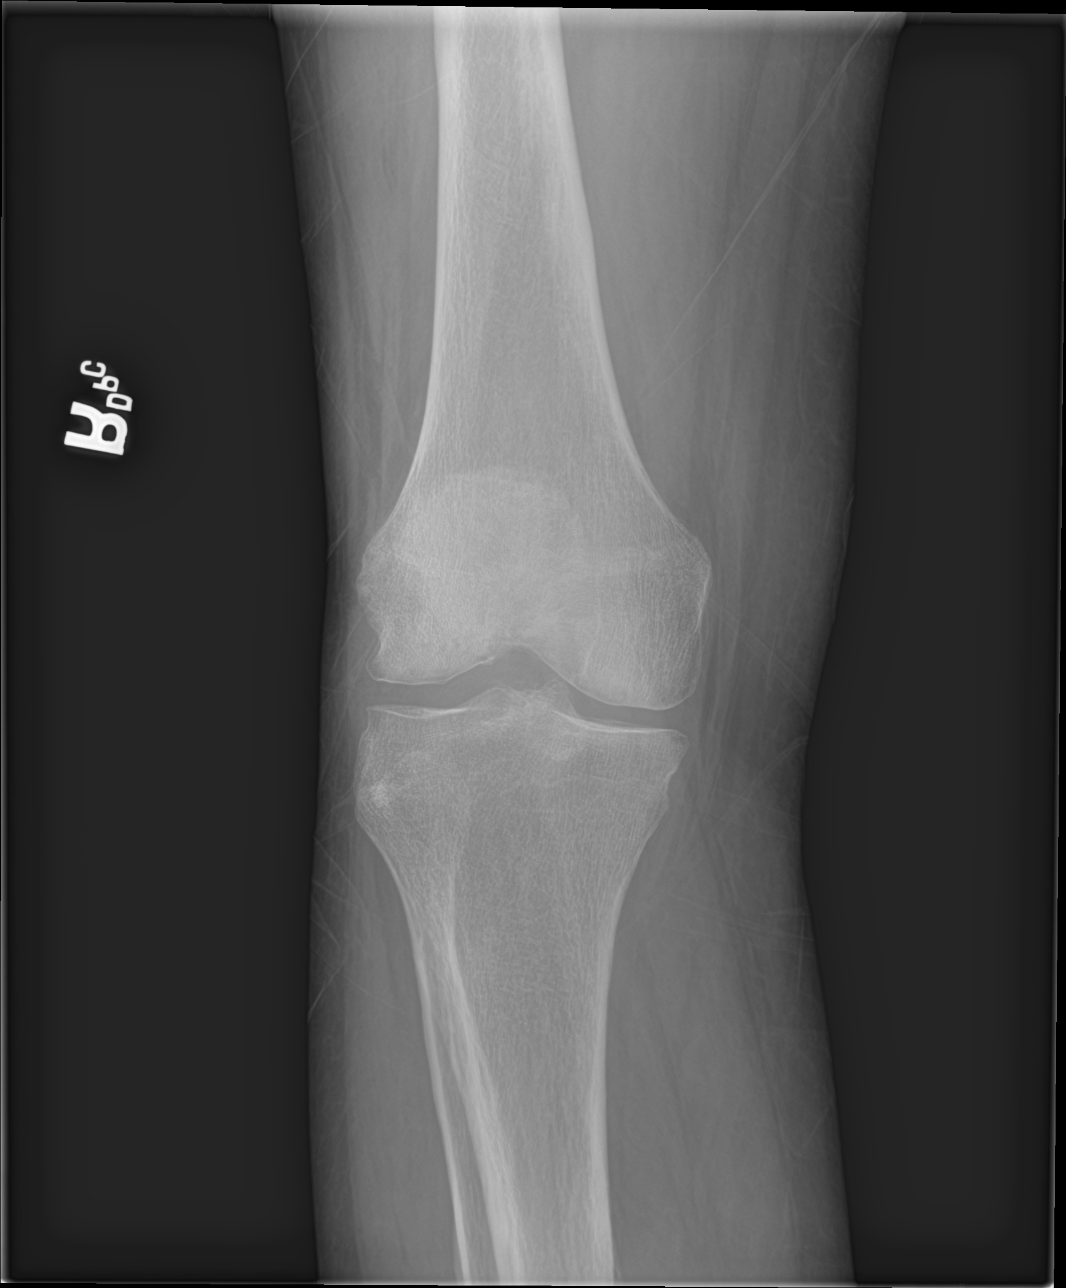

[knee obl (2 of 2)]
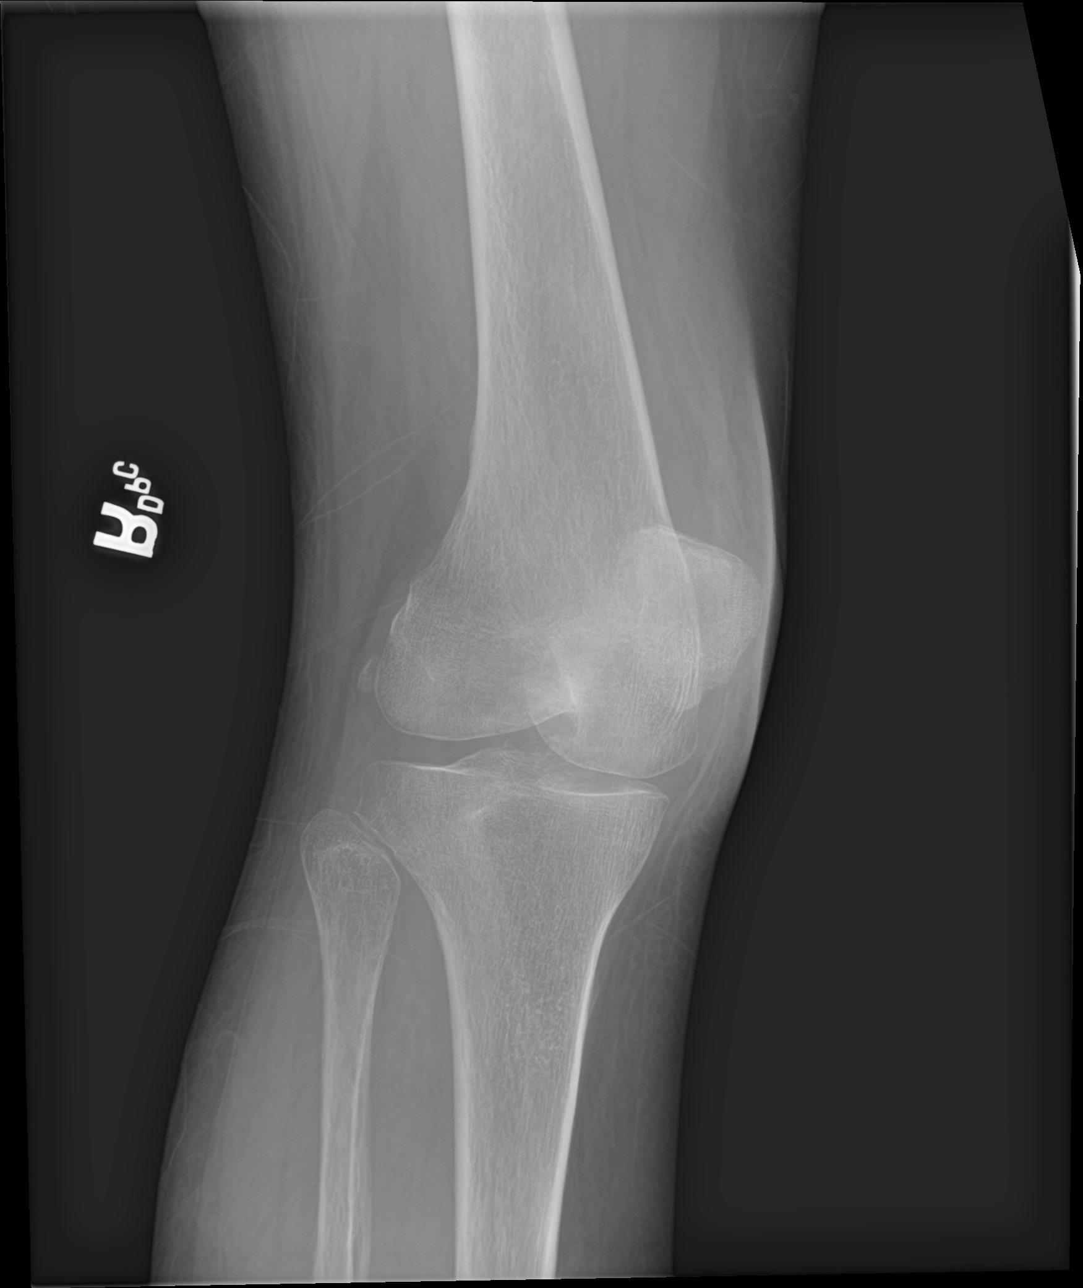

[knee lat]
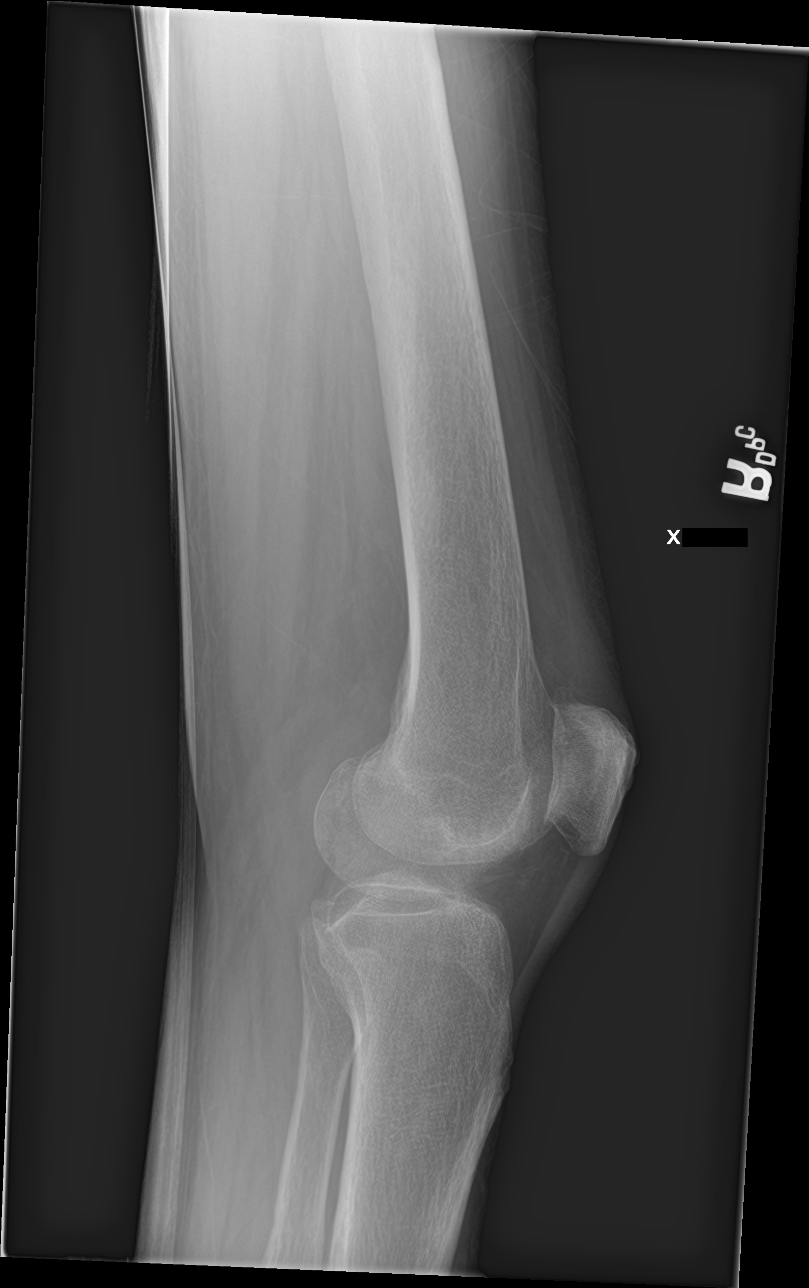

[4 of 4 positions shown; findings below may reference images not displayed]

FINDINGS: Medial joint space narrowing is noted with mild osteophytic change.
No acute fracture or dislocation is noted. No joint effusion is
seen. Mild patellofemoral degenerative changes noted.
IMPRESSION: Degenerative change without acute abnormality.

## 2020-07-08 IMAGING — DX DG HIP (WITH OR WITHOUT PELVIS) 2-3V*R*
3 series · 3 of 3 positions shown · non-contrast
Comparison: None.

CLINICAL DATA: Right hip pain and fall

EXAM:
DG HIP (WITH OR WITHOUT PELVIS) 2-3V RIGHT

[pelvis ap]
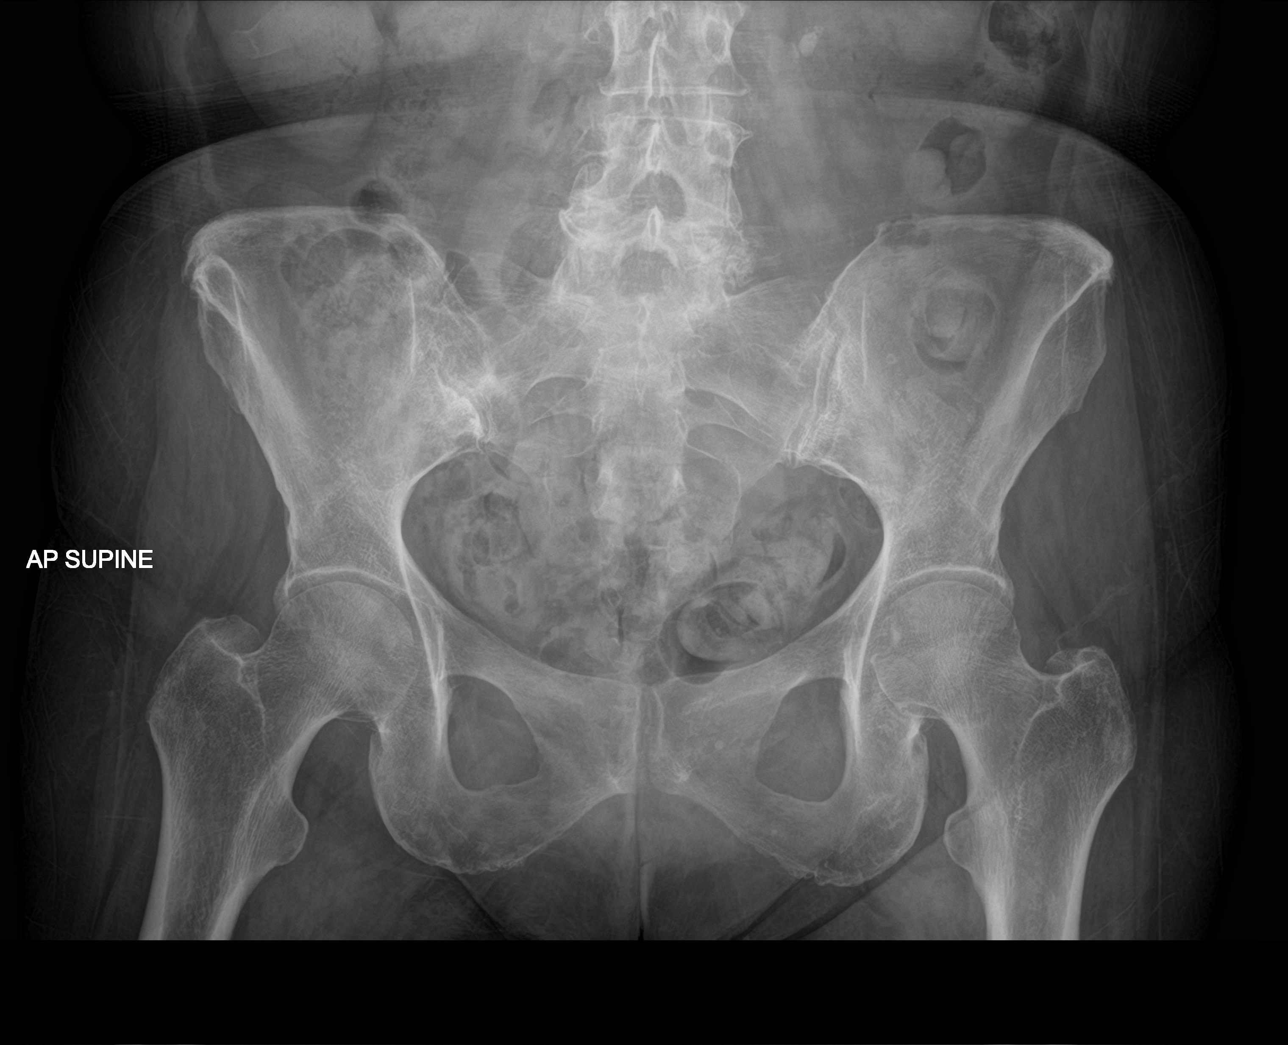

[hip ap]
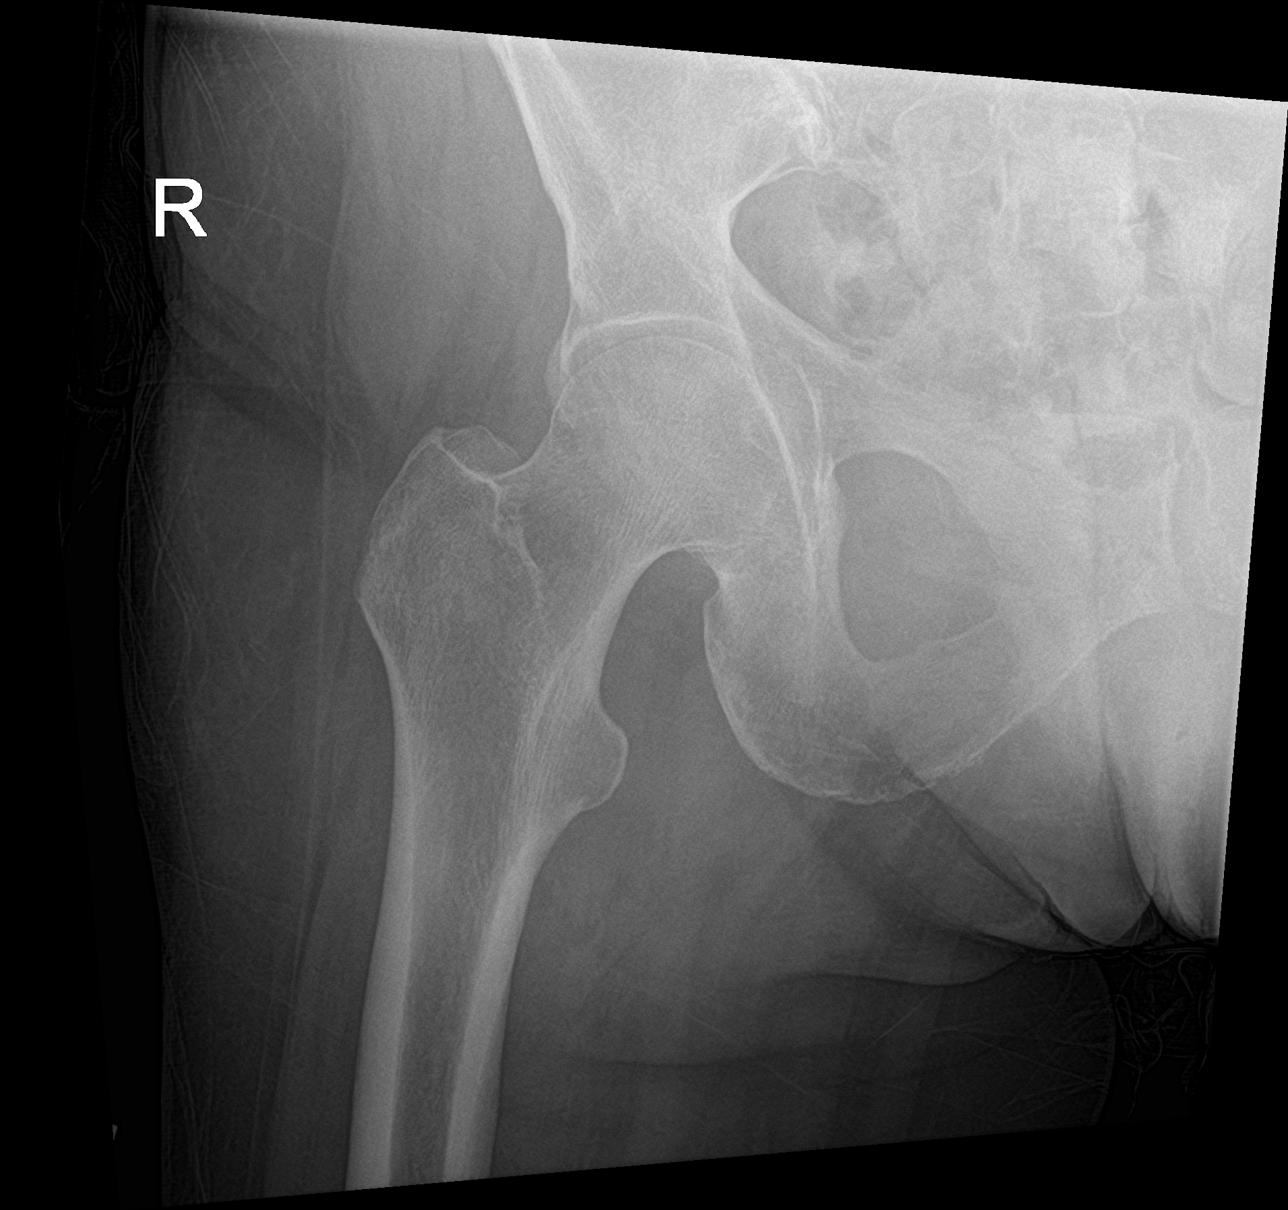

[hip lat]
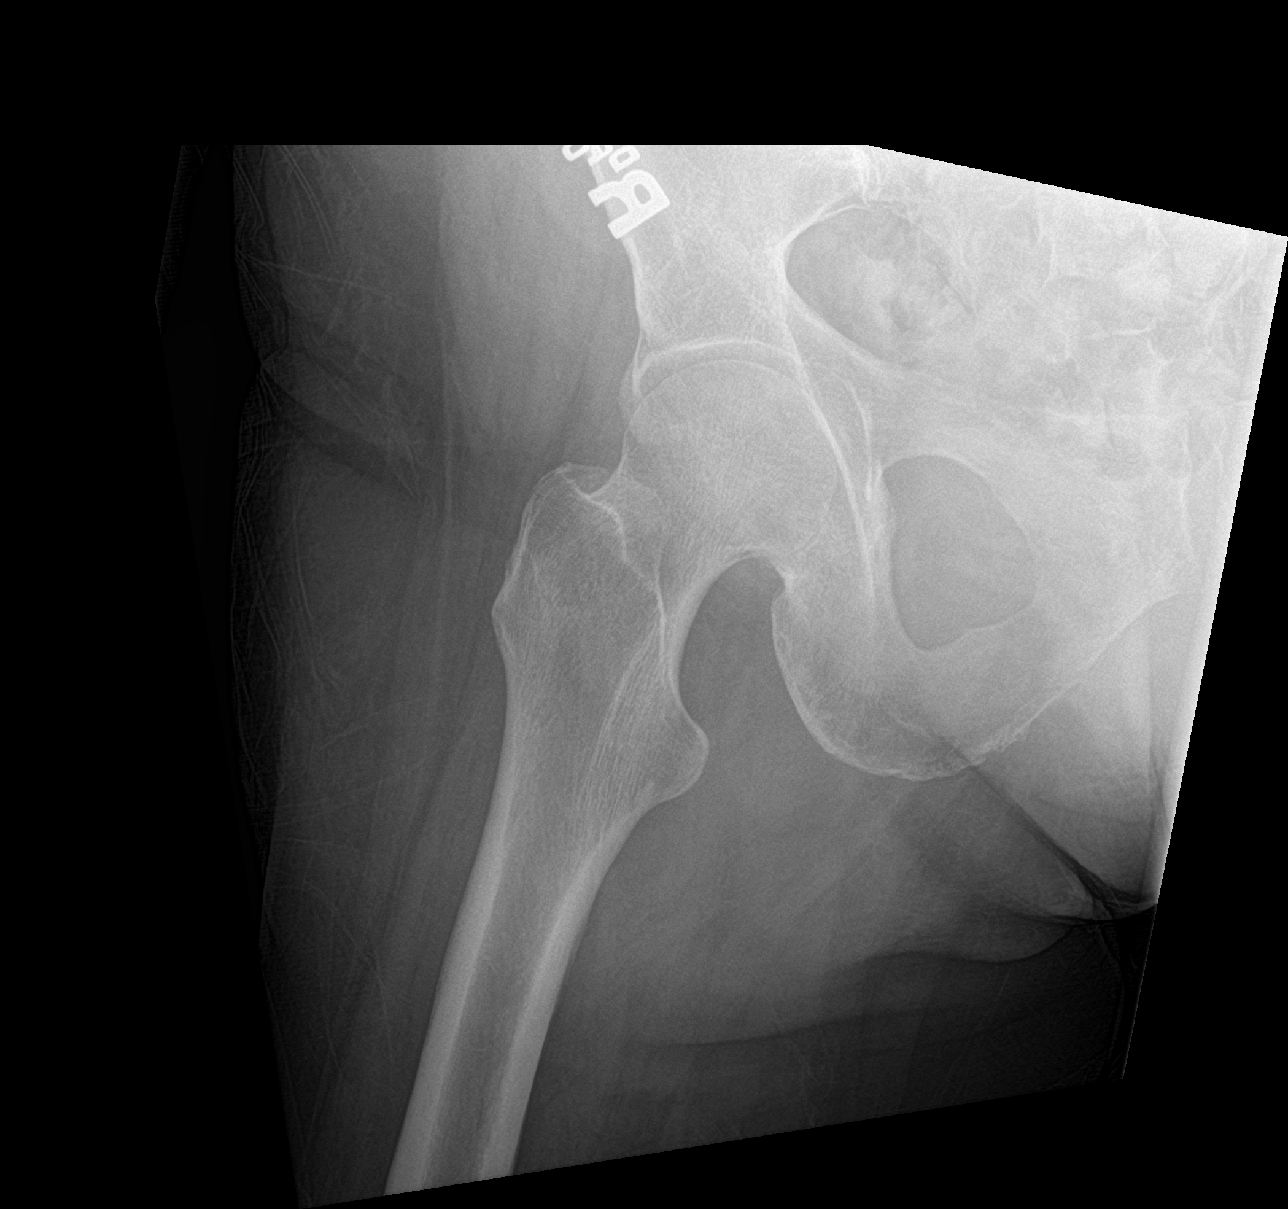

[3 of 3 positions shown; findings below may reference images not displayed]

FINDINGS: There is no evidence of hip fracture or dislocation. There is no
evidence of arthropathy or other focal bone abnormality.
IMPRESSION: Negative.

## 2020-07-11 NOTE — Telephone Encounter (Signed)
Patient had an appointment 07/04/20- this encounter will be closed out.

## 2020-07-14 ENCOUNTER — Telehealth: Payer: Self-pay | Admitting: *Deleted

## 2020-07-14 IMAGING — DX DG ABDOMEN 1V
1 series · 1 of 1 positions shown · non-contrast
Comparison: 12/07/2019, plain film 12/08/2019

CLINICAL DATA: 69-year-old female with left-sided nephrolithiasis.
Preoperative study

EXAM:
ABDOMEN - 1 VIEW

[abdomen kub]
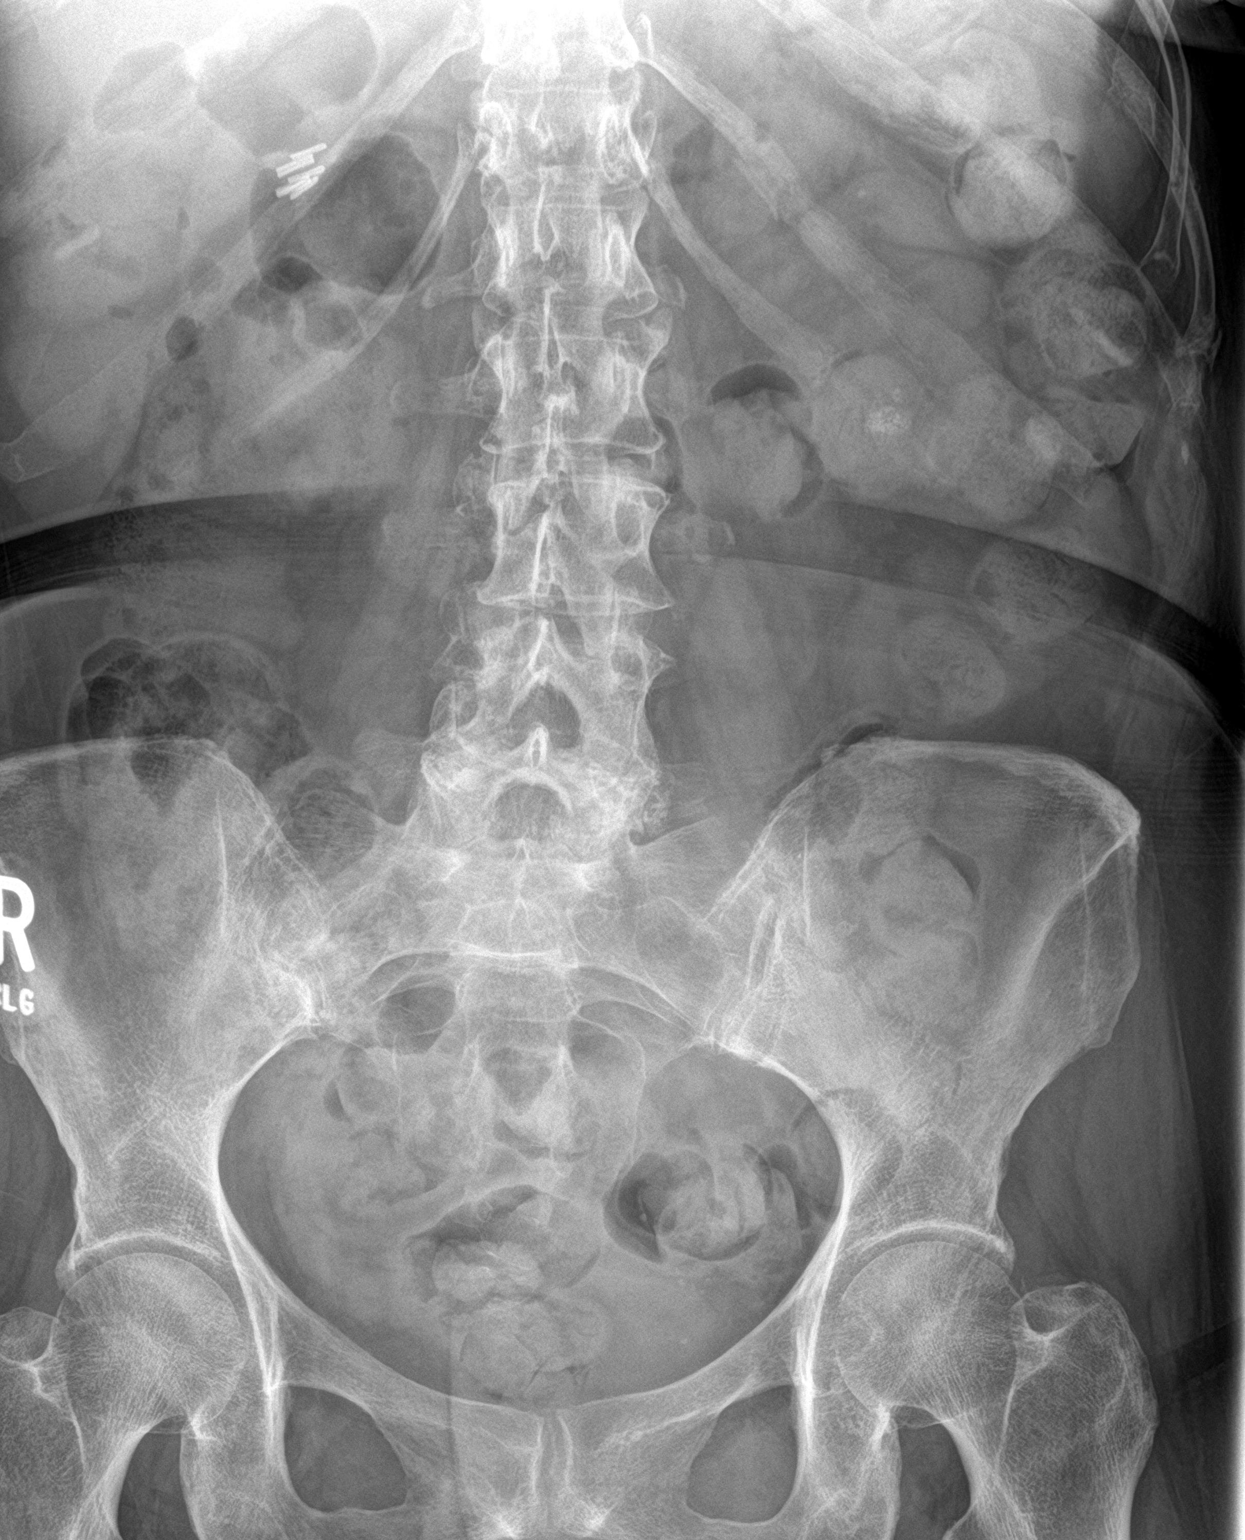

[1 of 1 positions shown; findings below may reference images not displayed]

FINDINGS: Gas within stomach small bowel and colon. No abnormal distension.
Mild formed stool burden within the colon.

Cholecystectomy.

Popcorn calcifications within the left abdomen, overlying the left
renal silhouette. Estimated diameter of 11 mm-43 mm, corresponding
to the prior CT. Questionable oval-shaped stone just lateral to the
left psoas at the disc space of L2-L3, potentially the stone
identified on prior CT at the ureteropelvic junction.

Punctate calcification projecting over the superior left renal
silhouette, corresponding to prior CT.

Mild left apex scoliotic curvature.  No acute displaced fracture
IMPRESSION: Plain film demonstrates left-sided nephrolithiasis corresponding to
the prior CT, which includes punctate calcification overlying the
superior left renal collecting system, popcorn calcifications
projecting over the inferior left collecting system, and a
questionable persisting stone at the ureteropelvic junction.

## 2020-07-14 NOTE — Telephone Encounter (Signed)
VM from [facility] w/ [facility], pt has decubitus ulcers on both heels Feels like pt needs HH referral LMOVM to pt's house that she will need a in office / video visit for order to call office to schedule this Also called Heather back to let her know this as well

## 2020-07-17 NOTE — Telephone Encounter (Signed)
Connected with husband, appt made for 12/7 at 345 with Dr. Nadine Counts for Video visit

## 2020-07-18 ENCOUNTER — Ambulatory Visit (INDEPENDENT_AMBULATORY_CARE_PROVIDER_SITE_OTHER): Payer: Medicare Other | Admitting: Family Medicine

## 2020-07-18 ENCOUNTER — Encounter: Payer: Self-pay | Admitting: Family Medicine

## 2020-07-18 DIAGNOSIS — L89616 Pressure-induced deep tissue damage of right heel: Secondary | ICD-10-CM | POA: Diagnosis not present

## 2020-07-18 DIAGNOSIS — Z7409 Other reduced mobility: Secondary | ICD-10-CM

## 2020-07-18 DIAGNOSIS — L89892 Pressure ulcer of other site, stage 2: Secondary | ICD-10-CM | POA: Diagnosis not present

## 2020-07-18 DIAGNOSIS — E1144 Type 2 diabetes mellitus with diabetic amyotrophy: Secondary | ICD-10-CM

## 2020-07-18 DIAGNOSIS — E1165 Type 2 diabetes mellitus with hyperglycemia: Secondary | ICD-10-CM

## 2020-07-18 DIAGNOSIS — Z789 Other specified health status: Secondary | ICD-10-CM

## 2020-07-18 NOTE — Progress Notes (Signed)
MyChart Video visit  Subjective: CC: F2F for HH, foot ulcers PCP: [name], FNP VHQ:IONGEX Monica [name] is a 69 y.o. female. Patient provides verbal consent for consult held via video.  Due to COVID-19 pandemic this visit was conducted virtually. This visit type was conducted due to national recommendations for restrictions regarding the COVID-19 Pandemic (e.g. social distancing, sheltering in place) in an effort to limit this patient's exposure and mitigate transmission in our community. All issues noted in this document were discussed and addressed.  A physical exam was not performed with this format.   Location of patient: home Location of provider: [facility] Others present for call: spouse  1. Foot ulcers Much of the information is provided by her spouse.  He notes that since she was discharged from the hospital she has had quite a bit of bruising on her heels posteriorly.  The seem to get worse when she was in rehabilitation.  She has some skin breakdown on the left foot.  He denies any active drainage, bleeding, tenderness or warmth.  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  She is scheduled to start IVIG tomorrow.  The previously used [facility] home [facility] would like to get them to come back out and assist her.  ROS: Per HPI  Allergies  Allergen Reactions  . Aspirin Anaphylaxis and Hives    Slurred speech, blurred vision- also  . Hydromorphone Hcl Shortness Of Breath and Other (See Comments)    Severe shortness of breath  . Vimpat [Lacosamide] Other (See Comments)    Hallucinations, out of body experience, confusion   . Depakote [Divalproex Sodium] Other (See Comments)    Reaction not recalled by husband  . Milnacipran Hcl Other (See Comments)    Dizziness   . Pristiq [Desvenlafaxine Succinate Monohydrate] Other (See Comments)    Dizziness   . Capsaicin Rash and Other (See Comments)    Red rash  . Gabapentin Other (See Comments)    States leg pain  worsened, pt d/c'd   Past Medical History:  Diagnosis Date  . Arthritis   . Cervical facet joint syndrome 04/22/2012  . Cervicalgia   . Chronic headaches    Dr Hermelinda [facility] [place] [facility]  . Chronic nausea    normal GES   . Complication of anesthesia    had problem with local anesthesia-tried to assist Physician  . DM (diabetes mellitus) (HCC)   . Elevated liver enzymes    hx of, normal 03/2010  . GERD (gastroesophageal reflux disease) 05/31/2011   History Schatzki's ring, erosive reflux esophagitis, status post EGD and dilation 8/11 and 11/20  . Glaucoma    lens in both eyes  . Hemorrhoids 11/17/08   Colonoscopy Dr Karilyn Cota  . Occipital neuralgia   . PTSD (post-traumatic stress disorder)   . Renal lithiasis   . 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 Torticollis   . Unspecified musculoskeletal disorders and symptoms referable to neck    cervical/trapezius    Current Outpatient Medications:  .  acetaminophen (TYLENOL) 325 MG tablet, Take 1-2 tablets (325-650 mg total) by mouth every 4 (four) hours as needed for mild pain., Disp: , Rfl:  .  atorvastatin (LIPITOR) 10 MG tablet, Take 1 tablet (10 mg total) by mouth daily., Disp: 90 tablet, Rfl: 1 .  bisacodyl (BISACODYL) 5 MG EC tablet, Take 2 tablets (10 mg total) by mouth daily., Disp: 60 tablet, Rfl: 0 .  gabapentin (NEURONTIN) 600 MG tablet, Take 1  tablet (600 mg total) by mouth 5 (five) times daily. Take 1 tablet 3 x daily and  2 tablets at 8pm daily, Disp: 150 tablet, Rfl: 3 .  insulin glargine (LANTUS SOLOSTAR) 100 UNIT/ML Solostar Pen, Inject 20 Units into the skin 2 (two) times daily., Disp: 15 mL, Rfl: 0 .  Insulin Pen Needle (CAREFINE PEN NEEDLES) 31G X 6 MM MISC, 1 application by Does not apply route in the morning, at noon, in the evening, and at bedtime., Disp: 100 each, Rfl: 1 .  lidocaine (LIDODERM) 5 %, Place 2 patches onto the skin daily. Apply at 8 am and remove at 8 pm--purchase over the counter., Disp: 30 patch,  Rfl: 0 .  linaclotide (LINZESS) 145 MCG CAPS capsule, Take 1 capsule (145 mcg total) by mouth daily before breakfast., Disp: 90 capsule, Rfl: 1 .  magnesium oxide (MAG-OX) 400 (241.3 Mg) MG tablet, Take 1 tablet (400 mg total) by mouth 2 (two) times daily., Disp: 60 tablet, Rfl: 2 .  metFORMIN (GLUCOPHAGE-XR) 500 MG 24 hr tablet, Take 1 tablet (500 mg total) by mouth 2 (two) times daily with a meal., Disp: 180 tablet, Rfl: 1 .  methadone (DOLOPHINE) 10 MG tablet, Take 2 tablets (20 mg total) by mouth 3 (three) times daily., Disp: 180 tablet, Rfl: 0 .  nortriptyline (PAMELOR) 50 MG capsule, Take 1 capsule (50 mg total) by mouth at bedtime., Disp: 30 capsule, Rfl: 4 .  tapentadol (NUCYNTA) 50 MG tablet, Take 50 mg by mouth., Disp: , Rfl:  .  tapentadol HCl (NUCYNTA) 75 MG tablet, Take 1 tablet (75 mg total) by mouth every 12 (twelve) hours as needed., Disp: 60 tablet, Rfl: 0 .  tiZANidine (ZANAFLEX) 2 MG tablet, TAKE (1) TABLET EVERY SIX HOURS AS NEEDED FOR MUSCLE SPASMS., Disp: 60 tablet, Rfl: 2 .  topiramate (TOPAMAX) 50 MG tablet, TAKE 2 TABLETS IN THE MORNING AND 4 TABLETS AT BEDTIME., Disp: 180 tablet, Rfl: 2  General: No acute distress.  Laying in hospital bed Heels: Bilateral heels with significant ecchymosis noted posteriorly.  Left foot with what appears to be healing ulceration with subsequent callus formation.  Assessment/ Plan: 69 y.o. 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  We certainly need to offset the pressure of her heels.  Wonder if she would benefit from a wedge pillow.  Concerned that there may have been an open pressure sore on the left foot that is in the healing process.  Additionally needs laboratory draw for A1c.  Last A1c was uncontrolled.  Start time: 3:43pm End time: 3:52pm  Total time spent on patient care (including video visit/ documentation): 9 minutes  Tamim Skog Hulen Skains, DO [facility] [phone]

## 2020-07-22 ENCOUNTER — Other Ambulatory Visit: Payer: Self-pay | Admitting: Family Medicine

## 2020-07-22 DIAGNOSIS — E1142 Type 2 diabetes mellitus with diabetic polyneuropathy: Secondary | ICD-10-CM

## 2020-07-24 ENCOUNTER — Other Ambulatory Visit: Payer: Self-pay | Admitting: Physical Medicine & Rehabilitation

## 2020-07-24 ENCOUNTER — Other Ambulatory Visit: Payer: Self-pay | Admitting: Family Medicine

## 2020-07-24 DIAGNOSIS — G43711 Chronic migraine without aura, intractable, with status migrainosus: Secondary | ICD-10-CM

## 2020-07-24 DIAGNOSIS — G4486 Cervicogenic headache: Secondary | ICD-10-CM

## 2020-07-24 DIAGNOSIS — E1142 Type 2 diabetes mellitus with diabetic polyneuropathy: Secondary | ICD-10-CM

## 2020-07-25 ENCOUNTER — Other Ambulatory Visit: Payer: Self-pay | Admitting: Physical Medicine & Rehabilitation

## 2020-07-25 DIAGNOSIS — G43711 Chronic migraine without aura, intractable, with status migrainosus: Secondary | ICD-10-CM

## 2020-07-25 DIAGNOSIS — S7002XS Contusion of left hip, sequela: Secondary | ICD-10-CM

## 2020-07-25 DIAGNOSIS — G4486 Cervicogenic headache: Secondary | ICD-10-CM

## 2020-08-01 ENCOUNTER — Telehealth: Payer: Self-pay | Admitting: *Deleted

## 2020-08-01 DIAGNOSIS — L89152 Pressure ulcer of sacral region, stage 2: Secondary | ICD-10-CM | POA: Diagnosis not present

## 2020-08-01 DIAGNOSIS — Z794 Long term (current) use of insulin: Secondary | ICD-10-CM | POA: Diagnosis not present

## 2020-08-01 DIAGNOSIS — M542 Cervicalgia: Secondary | ICD-10-CM | POA: Diagnosis not present

## 2020-08-01 DIAGNOSIS — E1144 Type 2 diabetes mellitus with diabetic amyotrophy: Secondary | ICD-10-CM | POA: Diagnosis not present

## 2020-08-01 DIAGNOSIS — E1139 Type 2 diabetes mellitus with other diabetic ophthalmic complication: Secondary | ICD-10-CM | POA: Diagnosis not present

## 2020-08-01 DIAGNOSIS — L89612 Pressure ulcer of right heel, stage 2: Secondary | ICD-10-CM | POA: Diagnosis not present

## 2020-08-01 DIAGNOSIS — E1165 Type 2 diabetes mellitus with hyperglycemia: Secondary | ICD-10-CM | POA: Diagnosis not present

## 2020-08-01 DIAGNOSIS — L89616 Pressure-induced deep tissue damage of right heel: Secondary | ICD-10-CM

## 2020-08-01 DIAGNOSIS — L89622 Pressure ulcer of left heel, stage 2: Secondary | ICD-10-CM | POA: Diagnosis not present

## 2020-08-01 DIAGNOSIS — L89892 Pressure ulcer of other site, stage 2: Secondary | ICD-10-CM

## 2020-08-01 DIAGNOSIS — H409 Unspecified glaucoma: Secondary | ICD-10-CM | POA: Diagnosis not present

## 2020-08-01 DIAGNOSIS — Z7984 Long term (current) use of oral hypoglycemic drugs: Secondary | ICD-10-CM | POA: Diagnosis not present

## 2020-08-01 NOTE — Telephone Encounter (Signed)
Sure. Rip Harbour to give verbal for that mattress topper if needed.

## 2020-08-01 NOTE — Telephone Encounter (Signed)
TC from [facility] December w/ [facility]  Gave VO ok for wound care of heels & coccyx, placed orders for labs from last visit, nurse will draw and drop of at office Pt seems to be spending more time in bed and may benefit from a gel overlay mattress to help with wounds

## 2020-08-01 NOTE — Telephone Encounter (Signed)
Patient checking on where to send order for mattress topper to

## 2020-08-02 ENCOUNTER — Encounter: Payer: Self-pay | Admitting: Physical Medicine & Rehabilitation

## 2020-08-02 ENCOUNTER — Encounter: Payer: Medicare Other | Attending: Physical Medicine & Rehabilitation | Admitting: Physical Medicine & Rehabilitation

## 2020-08-02 ENCOUNTER — Other Ambulatory Visit: Payer: Self-pay

## 2020-08-02 VITALS — BP 116/67 | HR 81 | Temp 99.3°F | Ht 64.0 in

## 2020-08-02 DIAGNOSIS — G894 Chronic pain syndrome: Secondary | ICD-10-CM | POA: Diagnosis not present

## 2020-08-02 DIAGNOSIS — G4486 Cervicogenic headache: Secondary | ICD-10-CM | POA: Insufficient documentation

## 2020-08-02 DIAGNOSIS — R519 Headache, unspecified: Secondary | ICD-10-CM | POA: Diagnosis not present

## 2020-08-02 DIAGNOSIS — M47812 Spondylosis without myelopathy or radiculopathy, cervical region: Secondary | ICD-10-CM | POA: Diagnosis not present

## 2020-08-02 DIAGNOSIS — E1144 Type 2 diabetes mellitus with diabetic amyotrophy: Secondary | ICD-10-CM | POA: Diagnosis present

## 2020-08-02 DIAGNOSIS — G43711 Chronic migraine without aura, intractable, with status migrainosus: Secondary | ICD-10-CM | POA: Insufficient documentation

## 2020-08-02 MED ORDER — TAPENTADOL HCL 75 MG PO TABS: 75.0000 mg | ORAL_TABLET | Freq: Two times a day (BID) | ORAL | 0 refills | Status: DC | PRN

## 2020-08-02 MED ORDER — METHADONE HCL 10 MG PO TABS: ORAL_TABLET | ORAL | 0 refills | Status: DC

## 2020-08-02 NOTE — Patient Instructions (Signed)
PLEASE FEEL FREE TO CALL OUR OFFICE WITH ANY PROBLEMS OR QUESTIONS ([phone])                                @                 @@               @@@                        @@@@                      @@@@@         @@@@@@                  @@@@@@@                @@@@@@@@              @@@@@@@@@             @@@@@@@@@@       IIII                  IIII                                                        HAPPY HOLIDAYS!!!!!  

## 2020-08-02 NOTE — Progress Notes (Signed)
Subjective:    Patient ID: Monica [name], female    DOB: [birth year], 69 y.o.   MRN: [id number]  HPI  Monica [name] is here in follow-up of her chronic pain and functional deficits as well as her diabetic amyotophy.  She was seen by neurology at [facility] about a month ago.  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  She is not sure if she has seen a big difference.  She has had some further improvement in her strength in her legs.  She was beginning to demonstrate some improvement in the right leg at her last visit.  She is moving the right leg more in the left leg has begun to move slightly as well now.  Pain levels are fairly consistent.  She has noticed that her headaches have been more severe as of late.  She remains on methadone and Nucynta for management of her pain.  Pain Inventory Average Pain 7 Pain Right Now 6 My pain is constant and aching  In the last 24 hours, has pain interfered with the following? General activity 7 Relation with others 9 Enjoyment of life 9 What TIME of day is your pain at its worst? morning , daytime, evening and night Sleep (in general) Good  Pain is worse with: some activites Pain improves with: medication Relief from Meds: not sure  Family History  Problem Relation Age of Onset  . Colon cancer Mother 93  . Heart disease Father   . Diabetes Father   . Diabetes Paternal Grandmother   . Heart disease Paternal Grandfather   . Diverticulitis Daughter    Social History   Socioeconomic History  . Marital status: Married    Spouse name: Not on file  . Number of children: 3  . Years of education: Not on file  . Highest education level: Not on file  Occupational History  . Occupation: disabled    Associate Professor: UNEMPLOYED  Tobacco Use  . Smoking status: Never Smoker  . Smokeless tobacco: Never Used  Vaping Use  . Vaping Use: Never used  Substance and Sexual Activity  . Alcohol  use: No  . Drug use: No  . Sexual activity: Yes    Birth control/protection: Post-menopausal  Other Topics Concern  . Not on file  Social History Narrative   Lives at home with husband    Right handed   Caffeine: none   Social Determinants of Health   Financial Resource Strain: Not on file  Food Insecurity: Not on file  Transportation Needs: Not on file  Physical Activity: Not on file  Stress: Not on file  Social Connections: Not on file   Past Surgical History:  Procedure Laterality Date  . BREAST LUMPECTOMY     left  . CATARACT EXTRACTION W/PHACO Left 10/07/2016   Procedure: CATARACT EXTRACTION PHACO AND INTRAOCULAR LENS PLACEMENT (IOC);  Surgeon: Gemma Payor, MD;  [facility];  Service: Ophthalmology;  Laterality: Left;  CDE: 4.98  . CATARACT EXTRACTION W/PHACO Right 10/21/2016   Procedure: CATARACT EXTRACTION PHACO AND INTRAOCULAR LENS PLACEMENT RIGHT EYE CDE - 6.24;  Surgeon: Gemma Payor, MD;  Location: [facility];  Service: Ophthalmology;  Laterality: Right;  right  . CHOLECYSTECTOMY    . COLONOSCOPY  11/17/08   ext hemorrhoids  . CRANIOTOMY     secondary TBI  . CYST EXCISION     left wrist  . ESOPHAGOGASTRODUODENOSCOPY  04/05/10  [name] (76F),erosive reflux esophagitis, small hiatal hernia,  . ESOPHAGOGASTRODUODENOSCOPY (EGD) WITH PROPOFOL N/A 08/30/2019   Procedure: ESOPHAGOGASTRODUODENOSCOPY (EGD) WITH PROPOFOL;  Surgeon: Corbin Ade, MD;  [facility];  Service: Endoscopy;  Laterality: N/A;  3:30pm - pt knows to arrive at 9:45  . EXTRACORPOREAL SHOCK WAVE LITHOTRIPSY Left 12/13/2019   Procedure: EXTRACORPOREAL SHOCK WAVE LITHOTRIPSY (ESWL);  Surgeon: Jerilee Field, MD;  Location: [facility];  Service: Urology;  Laterality: Left;  . FINGER SURGERY     removal cyst left pinky  . gastro empy study  03/03/10   normal  . HAND SURGERY    . MALONEY DILATION N/A 08/30/2019   Procedure: Elease Hashimoto DILATION;  Surgeon: Corbin Ade,  MD;  [facility];  Service: Endoscopy;  Laterality: N/A;  . NECK SURGERY  1998  . NEPHROLITHOTOMY  05/13/2012   Procedure: NEPHROLITHOTOMY PERCUTANEOUS;  Surgeon: Marcine Matar, MD;  [facility];  Service: Urology;  Laterality: Left;     . SURAL NERVE BX Right 02/28/2020   Procedure: SURAL NERVE / Quadricep BIOPSY;  Surgeon: Tia Alert, MD;  Location: [facility] OR;  Service: Neurosurgery;  Laterality: Right;   Past Surgical History:  Procedure Laterality Date  . BREAST LUMPECTOMY     left  . CATARACT EXTRACTION W/PHACO Left 10/07/2016   Procedure: CATARACT EXTRACTION PHACO AND INTRAOCULAR LENS PLACEMENT (IOC);  Surgeon: Gemma Payor, MD;  [facility];  Service: Ophthalmology;  Laterality: Left;  CDE: 4.98  . CATARACT EXTRACTION W/PHACO Right 10/21/2016   Procedure: CATARACT EXTRACTION PHACO AND INTRAOCULAR LENS PLACEMENT RIGHT EYE CDE - 6.24;  Surgeon: Gemma Payor, MD;  [facility];  Service: Ophthalmology;  Laterality: Right;  right  . CHOLECYSTECTOMY    . COLONOSCOPY  11/17/08   ext hemorrhoids  . CRANIOTOMY     secondary TBI  . CYST EXCISION     left wrist  . ESOPHAGOGASTRODUODENOSCOPY  04/05/10   [name] (76F),erosive reflux esophagitis, small hiatal hernia,  . ESOPHAGOGASTRODUODENOSCOPY (EGD) WITH PROPOFOL N/A 08/30/2019   Procedure: ESOPHAGOGASTRODUODENOSCOPY (EGD) WITH PROPOFOL;  Surgeon: Corbin Ade, MD;  [facility];  Service: Endoscopy;  Laterality: N/A;  3:30pm - pt knows to arrive at 9:45  . EXTRACORPOREAL SHOCK WAVE LITHOTRIPSY Left 12/13/2019   Procedure: EXTRACORPOREAL SHOCK WAVE LITHOTRIPSY (ESWL);  Surgeon: Jerilee Field, MD;  Location: [facility];  Service: Urology;  Laterality: Left;  . FINGER SURGERY     removal cyst left pinky  . gastro empy study  03/03/10   normal  . HAND SURGERY    . MALONEY DILATION N/A 08/30/2019   Procedure: Elease Hashimoto DILATION;  Surgeon: Corbin Ade, MD;  [facility];  Service: Endoscopy;  Laterality: N/A;  . NECK SURGERY  1998  . NEPHROLITHOTOMY  05/13/2012   Procedure: NEPHROLITHOTOMY PERCUTANEOUS;  Surgeon: Marcine Matar, MD;  [facility];  Service: Urology;  Laterality: Left;     . SURAL NERVE BX Right 02/28/2020   Procedure: SURAL NERVE / Quadricep BIOPSY;  Surgeon: Tia Alert, MD;  Location: [facility] OR;  Service: Neurosurgery;  Laterality: Right;   Past Medical History:  Diagnosis Date  . Arthritis   . Cervical facet joint syndrome 04/22/2012  . Cervicalgia   . Chronic headaches    Dr Hermelinda Medicus [place] [facility]  . Chronic nausea    normal GES   . Complication of anesthesia    had problem with local anesthesia-tried to  assist Physician  . DM (diabetes mellitus) (HCC)   . Elevated liver enzymes    hx of, normal 03/2010  . GERD (gastroesophageal reflux disease) 05/31/2011   History Schatzki's ring, erosive reflux esophagitis, status post EGD and dilation 8/11 and 11/20  . Glaucoma    lens in both eyes  . Hemorrhoids 11/17/08   Colonoscopy Dr Karilyn Cotaehman  . Occipital neuralgia   . PTSD (post-traumatic stress disorder)   . Renal lithiasis   . 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 Torticollis   . Unspecified musculoskeletal disorders and symptoms referable to neck    cervical/trapezius   BP 116/67   Pulse 81   Temp 99.3 F (37.4 C)   Ht 5\' 4"  (1.626 m)   SpO2 96%   BMI 22.66 kg/m   Opioid Risk Score:   Fall Risk Score:  `1  Depression screen PHQ 2/9  [facility] 2/9 05/04/2020 04/04/2020 01/11/2020 10/29/2019 10/22/2019 07/16/2019 06/29/2019  Decreased Interest 0 0 0 0 0 0 1  Down, Depressed, Hopeless 0 3 1 0 0 0 1  PHQ - 2 Score 0 3 1 0 0 0 2  Altered sleeping - 3 - - - - 3  Tired, decreased energy - 3 - - - - 2  Change in appetite - 3 - - - - 3  Feeling bad or failure about yourself  - 0 - - - - 1  Trouble concentrating - 3 - - - - 3  Moving slowly or fidgety/restless - 0 - - - - 2  Suicidal thoughts - 1  - - - - 0  PHQ-9 Score - 16 - - - - 16  Difficult doing work/chores - - - - - - Not difficult at all  Some recent data might be hidden   Review of Systems  Gastrointestinal: Positive for abdominal pain.  Musculoskeletal: Positive for gait problem. Negative for back pain.  Neurological: Positive for headaches.  All other systems reviewed and are negative.      Objective:   Physical Exam  Gen: in distress d/t pain, normal appearing HEENT: oral mucosa pink and moist, NCAT Cardio: Reg rate Chest: normal effort, normal rate of breathing Abd: soft, non-distended Ext: Trace edema Skin: warm. Neuro: Upper extremity motor is 5 out of 5.  RLE 2 to 2 + prox to trace to 1/5. LLE 1/5 prox to trace-0/5 distally.   Reflexes are absent. sensation appears intact in UE but diminished in LE's  Musculoskeletal: LE muscle wasting.  Still present Psych: Appears to be in good spirits today.         Assessment & Plan:  1.  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  Patient still with significant pain in both legs right more than left with associated motor and sensory loss.             -She [facility]  However, she was demonstrating some improvement in her right leg prior to their treatment and has had further improvement in the right leg and now her left leg since I last saw her.  Regardless I am encouraged that were starting to see some changes..  2.  Chronic migraine headaches 3.  Chronic cervical spondylosis with facet arthropathy and associated cervicalgia and headaches 4.  Diabetes type II: Still inadequately controlled 5. Bilateral LE edema. Some improvement 6.  Constipation/ neurogenic bowel   Plan:  1 HEP, would like  to get her into outpt as soon.as we can. 2.  Refilled her morphine 20 mg 3 times daily per baseline regimen             We will continue the controlled substance monitoring program, this consists of regular clinic  visits, examinations, routine drug screening, pill counts as well as use of [place] Controlled Substance Reporting System. [place] was [place] today.               -Medication was refilled and a second prescription was sent to the patient's pharmacy for next month.    3.  continue Nucynta 75 mg every 12 hours as needed to better control neuropathic pain.  Refill today 4.  maintain gabapentin to 600 mg 3 times daily and 1200 mg in the evening for neuropathic pain symptoms----down to 5x daily currently. 5.    Maintain nortriptyline   50 mg at bedtime 6.  neurology [facility] treating for CIDP. Urged her to continue working on [employer]. Would like her to resume HH PT at some point, eventually outpt PT.  7. Naproxen as needed only for severe pain given ARF 8.  Bowel program with bid miralax, hs senna-s and am suppository    15 minutes of face to face patient care time were spent during this visit. All questions were encouraged and answered. Follow up with me in 2 mos.

## 2020-08-03 NOTE — Telephone Encounter (Signed)
Order placed to have signed to fax to [facility]

## 2020-08-03 NOTE — Addendum Note (Signed)
Addended by: Julious Payer D on: 08/03/2020 04:17 PM   Modules accepted: Orders

## 2020-08-07 ENCOUNTER — Other Ambulatory Visit: Payer: Self-pay | Admitting: Physical Medicine and Rehabilitation

## 2020-08-07 ENCOUNTER — Other Ambulatory Visit: Payer: Self-pay | Admitting: Family Medicine

## 2020-08-07 DIAGNOSIS — E119 Type 2 diabetes mellitus without complications: Secondary | ICD-10-CM | POA: Diagnosis not present

## 2020-08-07 DIAGNOSIS — Z79899 Other long term (current) drug therapy: Secondary | ICD-10-CM | POA: Diagnosis not present

## 2020-08-07 DIAGNOSIS — L89612 Pressure ulcer of right heel, stage 2: Secondary | ICD-10-CM | POA: Diagnosis not present

## 2020-08-07 DIAGNOSIS — E1165 Type 2 diabetes mellitus with hyperglycemia: Secondary | ICD-10-CM | POA: Diagnosis not present

## 2020-08-07 DIAGNOSIS — E1144 Type 2 diabetes mellitus with diabetic amyotrophy: Secondary | ICD-10-CM | POA: Diagnosis not present

## 2020-08-07 DIAGNOSIS — E1139 Type 2 diabetes mellitus with other diabetic ophthalmic complication: Secondary | ICD-10-CM | POA: Diagnosis not present

## 2020-08-07 DIAGNOSIS — L89622 Pressure ulcer of left heel, stage 2: Secondary | ICD-10-CM | POA: Diagnosis not present

## 2020-08-07 DIAGNOSIS — L89152 Pressure ulcer of sacral region, stage 2: Secondary | ICD-10-CM | POA: Diagnosis not present

## 2020-08-10 ENCOUNTER — Telehealth: Payer: Self-pay | Admitting: *Deleted

## 2020-08-10 DIAGNOSIS — L89612 Pressure ulcer of right heel, stage 2: Secondary | ICD-10-CM | POA: Diagnosis not present

## 2020-08-10 DIAGNOSIS — L89152 Pressure ulcer of sacral region, stage 2: Secondary | ICD-10-CM | POA: Diagnosis not present

## 2020-08-10 DIAGNOSIS — E1144 Type 2 diabetes mellitus with diabetic amyotrophy: Secondary | ICD-10-CM | POA: Diagnosis not present

## 2020-08-10 DIAGNOSIS — L89622 Pressure ulcer of left heel, stage 2: Secondary | ICD-10-CM | POA: Diagnosis not present

## 2020-08-10 DIAGNOSIS — E1165 Type 2 diabetes mellitus with hyperglycemia: Secondary | ICD-10-CM | POA: Diagnosis not present

## 2020-08-10 DIAGNOSIS — E1139 Type 2 diabetes mellitus with other diabetic ophthalmic complication: Secondary | ICD-10-CM | POA: Diagnosis not present

## 2020-08-10 MED ORDER — SULFAMETHOXAZOLE-TRIMETHOPRIM 800-160 MG PO TABS: 1.0000 | ORAL_TABLET | Freq: Two times a day (BID) | ORAL | 0 refills | Status: DC

## 2020-08-10 NOTE — Telephone Encounter (Signed)
I sent Bactrim for the patient

## 2020-08-10 NOTE — Telephone Encounter (Signed)
Seeing for wound care - one on right and one on left - right one is now red, and draining - it is warm to touch.   Allergy list up to date - can we send in antibiotic for foot - per [facility] request   [facility] pharmacy

## 2020-08-14 DIAGNOSIS — L89152 Pressure ulcer of sacral region, stage 2: Secondary | ICD-10-CM | POA: Diagnosis not present

## 2020-08-14 DIAGNOSIS — L89622 Pressure ulcer of left heel, stage 2: Secondary | ICD-10-CM | POA: Diagnosis not present

## 2020-08-14 DIAGNOSIS — E1165 Type 2 diabetes mellitus with hyperglycemia: Secondary | ICD-10-CM | POA: Diagnosis not present

## 2020-08-14 DIAGNOSIS — E1139 Type 2 diabetes mellitus with other diabetic ophthalmic complication: Secondary | ICD-10-CM | POA: Diagnosis not present

## 2020-08-14 DIAGNOSIS — E1144 Type 2 diabetes mellitus with diabetic amyotrophy: Secondary | ICD-10-CM | POA: Diagnosis not present

## 2020-08-14 DIAGNOSIS — L89612 Pressure ulcer of right heel, stage 2: Secondary | ICD-10-CM | POA: Diagnosis not present

## 2020-08-14 NOTE — Telephone Encounter (Signed)
Pt husband picked up med on Friday and wound looks better - redness down and no draining

## 2020-08-15 DIAGNOSIS — E1165 Type 2 diabetes mellitus with hyperglycemia: Secondary | ICD-10-CM | POA: Diagnosis not present

## 2020-08-15 DIAGNOSIS — L89152 Pressure ulcer of sacral region, stage 2: Secondary | ICD-10-CM | POA: Diagnosis not present

## 2020-08-15 DIAGNOSIS — E1144 Type 2 diabetes mellitus with diabetic amyotrophy: Secondary | ICD-10-CM | POA: Diagnosis not present

## 2020-08-15 DIAGNOSIS — L89612 Pressure ulcer of right heel, stage 2: Secondary | ICD-10-CM | POA: Diagnosis not present

## 2020-08-15 DIAGNOSIS — E1139 Type 2 diabetes mellitus with other diabetic ophthalmic complication: Secondary | ICD-10-CM | POA: Diagnosis not present

## 2020-08-15 DIAGNOSIS — L89622 Pressure ulcer of left heel, stage 2: Secondary | ICD-10-CM | POA: Diagnosis not present

## 2020-08-15 NOTE — Telephone Encounter (Signed)
Order was faxed on 08/03/20

## 2020-08-21 DIAGNOSIS — E1144 Type 2 diabetes mellitus with diabetic amyotrophy: Secondary | ICD-10-CM | POA: Diagnosis not present

## 2020-08-21 DIAGNOSIS — E1139 Type 2 diabetes mellitus with other diabetic ophthalmic complication: Secondary | ICD-10-CM | POA: Diagnosis not present

## 2020-08-21 DIAGNOSIS — L89152 Pressure ulcer of sacral region, stage 2: Secondary | ICD-10-CM | POA: Diagnosis not present

## 2020-08-21 DIAGNOSIS — L89622 Pressure ulcer of left heel, stage 2: Secondary | ICD-10-CM | POA: Diagnosis not present

## 2020-08-21 DIAGNOSIS — E1165 Type 2 diabetes mellitus with hyperglycemia: Secondary | ICD-10-CM | POA: Diagnosis not present

## 2020-08-21 DIAGNOSIS — L89612 Pressure ulcer of right heel, stage 2: Secondary | ICD-10-CM | POA: Diagnosis not present

## 2020-08-23 DIAGNOSIS — E1165 Type 2 diabetes mellitus with hyperglycemia: Secondary | ICD-10-CM | POA: Diagnosis not present

## 2020-08-23 DIAGNOSIS — L89622 Pressure ulcer of left heel, stage 2: Secondary | ICD-10-CM | POA: Diagnosis not present

## 2020-08-23 DIAGNOSIS — E1144 Type 2 diabetes mellitus with diabetic amyotrophy: Secondary | ICD-10-CM | POA: Diagnosis not present

## 2020-08-23 DIAGNOSIS — L89612 Pressure ulcer of right heel, stage 2: Secondary | ICD-10-CM | POA: Diagnosis not present

## 2020-08-23 DIAGNOSIS — L89152 Pressure ulcer of sacral region, stage 2: Secondary | ICD-10-CM | POA: Diagnosis not present

## 2020-08-23 DIAGNOSIS — E1139 Type 2 diabetes mellitus with other diabetic ophthalmic complication: Secondary | ICD-10-CM | POA: Diagnosis not present

## 2020-08-28 ENCOUNTER — Other Ambulatory Visit: Payer: Self-pay | Admitting: Family Medicine

## 2020-08-28 ENCOUNTER — Other Ambulatory Visit: Payer: Self-pay | Admitting: Physical Medicine & Rehabilitation

## 2020-08-28 DIAGNOSIS — Z794 Long term (current) use of insulin: Secondary | ICD-10-CM

## 2020-08-29 DIAGNOSIS — E1144 Type 2 diabetes mellitus with diabetic amyotrophy: Secondary | ICD-10-CM | POA: Diagnosis not present

## 2020-08-29 DIAGNOSIS — L89152 Pressure ulcer of sacral region, stage 2: Secondary | ICD-10-CM | POA: Diagnosis not present

## 2020-08-29 DIAGNOSIS — E1139 Type 2 diabetes mellitus with other diabetic ophthalmic complication: Secondary | ICD-10-CM | POA: Diagnosis not present

## 2020-08-29 DIAGNOSIS — E1165 Type 2 diabetes mellitus with hyperglycemia: Secondary | ICD-10-CM | POA: Diagnosis not present

## 2020-08-29 DIAGNOSIS — L89612 Pressure ulcer of right heel, stage 2: Secondary | ICD-10-CM | POA: Diagnosis not present

## 2020-08-29 DIAGNOSIS — L89622 Pressure ulcer of left heel, stage 2: Secondary | ICD-10-CM | POA: Diagnosis not present

## 2020-08-29 NOTE — Telephone Encounter (Signed)
Refill request for Topiramate. No indication from note that patient is taking Topiramate.Is this okay to refill?

## 2020-08-29 NOTE — Telephone Encounter (Signed)
I refilled it.  thx

## 2020-08-31 ENCOUNTER — Other Ambulatory Visit: Payer: Self-pay

## 2020-08-31 ENCOUNTER — Ambulatory Visit (INDEPENDENT_AMBULATORY_CARE_PROVIDER_SITE_OTHER): Payer: Medicare Other

## 2020-08-31 DIAGNOSIS — E1144 Type 2 diabetes mellitus with diabetic amyotrophy: Secondary | ICD-10-CM | POA: Diagnosis not present

## 2020-08-31 DIAGNOSIS — Z7984 Long term (current) use of oral hypoglycemic drugs: Secondary | ICD-10-CM

## 2020-08-31 DIAGNOSIS — L89612 Pressure ulcer of right heel, stage 2: Secondary | ICD-10-CM | POA: Diagnosis not present

## 2020-08-31 DIAGNOSIS — E1139 Type 2 diabetes mellitus with other diabetic ophthalmic complication: Secondary | ICD-10-CM | POA: Diagnosis not present

## 2020-08-31 DIAGNOSIS — H409 Unspecified glaucoma: Secondary | ICD-10-CM | POA: Diagnosis not present

## 2020-08-31 DIAGNOSIS — Z794 Long term (current) use of insulin: Secondary | ICD-10-CM

## 2020-08-31 DIAGNOSIS — L89152 Pressure ulcer of sacral region, stage 2: Secondary | ICD-10-CM

## 2020-08-31 DIAGNOSIS — L89622 Pressure ulcer of left heel, stage 2: Secondary | ICD-10-CM | POA: Diagnosis not present

## 2020-08-31 DIAGNOSIS — E1165 Type 2 diabetes mellitus with hyperglycemia: Secondary | ICD-10-CM | POA: Diagnosis not present

## 2020-08-31 DIAGNOSIS — M542 Cervicalgia: Secondary | ICD-10-CM | POA: Diagnosis not present

## 2020-09-04 DIAGNOSIS — E1139 Type 2 diabetes mellitus with other diabetic ophthalmic complication: Secondary | ICD-10-CM | POA: Diagnosis not present

## 2020-09-04 DIAGNOSIS — E1165 Type 2 diabetes mellitus with hyperglycemia: Secondary | ICD-10-CM | POA: Diagnosis not present

## 2020-09-04 DIAGNOSIS — L89622 Pressure ulcer of left heel, stage 2: Secondary | ICD-10-CM | POA: Diagnosis not present

## 2020-09-04 DIAGNOSIS — L89612 Pressure ulcer of right heel, stage 2: Secondary | ICD-10-CM | POA: Diagnosis not present

## 2020-09-04 DIAGNOSIS — L89152 Pressure ulcer of sacral region, stage 2: Secondary | ICD-10-CM | POA: Diagnosis not present

## 2020-09-04 DIAGNOSIS — E1144 Type 2 diabetes mellitus with diabetic amyotrophy: Secondary | ICD-10-CM | POA: Diagnosis not present

## 2020-09-05 ENCOUNTER — Other Ambulatory Visit: Payer: Self-pay | Admitting: Family Medicine

## 2020-09-05 DIAGNOSIS — E1142 Type 2 diabetes mellitus with diabetic polyneuropathy: Secondary | ICD-10-CM

## 2020-09-07 DIAGNOSIS — E1165 Type 2 diabetes mellitus with hyperglycemia: Secondary | ICD-10-CM | POA: Diagnosis not present

## 2020-09-07 DIAGNOSIS — L89152 Pressure ulcer of sacral region, stage 2: Secondary | ICD-10-CM | POA: Diagnosis not present

## 2020-09-07 DIAGNOSIS — L89612 Pressure ulcer of right heel, stage 2: Secondary | ICD-10-CM | POA: Diagnosis not present

## 2020-09-07 DIAGNOSIS — E1139 Type 2 diabetes mellitus with other diabetic ophthalmic complication: Secondary | ICD-10-CM | POA: Diagnosis not present

## 2020-09-07 DIAGNOSIS — L89622 Pressure ulcer of left heel, stage 2: Secondary | ICD-10-CM | POA: Diagnosis not present

## 2020-09-07 DIAGNOSIS — E1144 Type 2 diabetes mellitus with diabetic amyotrophy: Secondary | ICD-10-CM | POA: Diagnosis not present

## 2020-09-11 DIAGNOSIS — E1165 Type 2 diabetes mellitus with hyperglycemia: Secondary | ICD-10-CM | POA: Diagnosis not present

## 2020-09-14 DIAGNOSIS — E1139 Type 2 diabetes mellitus with other diabetic ophthalmic complication: Secondary | ICD-10-CM | POA: Diagnosis not present

## 2020-09-14 DIAGNOSIS — L89622 Pressure ulcer of left heel, stage 2: Secondary | ICD-10-CM | POA: Diagnosis not present

## 2020-09-14 DIAGNOSIS — E1165 Type 2 diabetes mellitus with hyperglycemia: Secondary | ICD-10-CM | POA: Diagnosis not present

## 2020-09-14 DIAGNOSIS — E1144 Type 2 diabetes mellitus with diabetic amyotrophy: Secondary | ICD-10-CM | POA: Diagnosis not present

## 2020-09-14 DIAGNOSIS — L89152 Pressure ulcer of sacral region, stage 2: Secondary | ICD-10-CM | POA: Diagnosis not present

## 2020-09-14 DIAGNOSIS — L89612 Pressure ulcer of right heel, stage 2: Secondary | ICD-10-CM | POA: Diagnosis not present

## 2020-09-18 DIAGNOSIS — E1144 Type 2 diabetes mellitus with diabetic amyotrophy: Secondary | ICD-10-CM | POA: Diagnosis not present

## 2020-09-18 DIAGNOSIS — L89622 Pressure ulcer of left heel, stage 2: Secondary | ICD-10-CM | POA: Diagnosis not present

## 2020-09-18 DIAGNOSIS — L89612 Pressure ulcer of right heel, stage 2: Secondary | ICD-10-CM | POA: Diagnosis not present

## 2020-09-18 DIAGNOSIS — E1165 Type 2 diabetes mellitus with hyperglycemia: Secondary | ICD-10-CM | POA: Diagnosis not present

## 2020-09-18 DIAGNOSIS — L89152 Pressure ulcer of sacral region, stage 2: Secondary | ICD-10-CM | POA: Diagnosis not present

## 2020-09-18 DIAGNOSIS — E1139 Type 2 diabetes mellitus with other diabetic ophthalmic complication: Secondary | ICD-10-CM | POA: Diagnosis not present

## 2020-09-19 DIAGNOSIS — E1165 Type 2 diabetes mellitus with hyperglycemia: Secondary | ICD-10-CM | POA: Diagnosis not present

## 2020-09-19 DIAGNOSIS — L89612 Pressure ulcer of right heel, stage 2: Secondary | ICD-10-CM | POA: Diagnosis not present

## 2020-09-19 DIAGNOSIS — L89152 Pressure ulcer of sacral region, stage 2: Secondary | ICD-10-CM | POA: Diagnosis not present

## 2020-09-19 DIAGNOSIS — E1139 Type 2 diabetes mellitus with other diabetic ophthalmic complication: Secondary | ICD-10-CM | POA: Diagnosis not present

## 2020-09-19 DIAGNOSIS — E1144 Type 2 diabetes mellitus with diabetic amyotrophy: Secondary | ICD-10-CM | POA: Diagnosis not present

## 2020-09-19 DIAGNOSIS — L89622 Pressure ulcer of left heel, stage 2: Secondary | ICD-10-CM | POA: Diagnosis not present

## 2020-09-27 ENCOUNTER — Other Ambulatory Visit: Payer: Self-pay

## 2020-09-27 ENCOUNTER — Encounter: Payer: Self-pay | Admitting: Physical Medicine & Rehabilitation

## 2020-09-27 ENCOUNTER — Encounter
Payer: Worker's Compensation | Attending: Physical Medicine & Rehabilitation | Admitting: Physical Medicine & Rehabilitation

## 2020-09-27 VITALS — BP 109/67 | HR 68 | Temp 98.0°F

## 2020-09-27 DIAGNOSIS — E1144 Type 2 diabetes mellitus with diabetic amyotrophy: Secondary | ICD-10-CM | POA: Insufficient documentation

## 2020-09-27 DIAGNOSIS — F329 Major depressive disorder, single episode, unspecified: Secondary | ICD-10-CM | POA: Insufficient documentation

## 2020-09-27 DIAGNOSIS — G4486 Cervicogenic headache: Secondary | ICD-10-CM | POA: Insufficient documentation

## 2020-09-27 DIAGNOSIS — G894 Chronic pain syndrome: Secondary | ICD-10-CM | POA: Insufficient documentation

## 2020-09-27 DIAGNOSIS — Z79891 Long term (current) use of opiate analgesic: Secondary | ICD-10-CM | POA: Diagnosis not present

## 2020-09-27 DIAGNOSIS — Z5181 Encounter for therapeutic drug level monitoring: Secondary | ICD-10-CM | POA: Diagnosis not present

## 2020-09-27 DIAGNOSIS — G43711 Chronic migraine without aura, intractable, with status migrainosus: Secondary | ICD-10-CM | POA: Insufficient documentation

## 2020-09-27 MED ORDER — METHADONE HCL 10 MG PO TABS: ORAL_TABLET | ORAL | 0 refills | Status: DC

## 2020-09-27 MED ORDER — TAPENTADOL HCL 75 MG PO TABS: 75.0000 mg | ORAL_TABLET | Freq: Two times a day (BID) | ORAL | 0 refills | Status: DC | PRN

## 2020-09-27 MED ORDER — TAPENTADOL HCL 75 MG PO TABS: 75.0000 mg | ORAL_TABLET | Freq: Two times a day (BID) | ORAL | 0 refills | Status: AC | PRN

## 2020-09-27 NOTE — Progress Notes (Signed)
Subjective:    Patient ID: Monica [name], female    DOB: 11-13-1950, 70 y.o.   MRN: [id number]  HPI   Monica [name] is here in follow up of her gait deficits and weakness as well as her chronic pain. She  has experienced numbness that began this morning involving her left upper extremity, radial side and her entire left leg. Chest/back are spared. She's had a significant migraine headache today as well.    She maintains follow up with [facility] for further assessment of her neuropathy. They are treating her with IVIG for suspected CIDP. She hasn't seen much change with the treatments however.  She continues with ongoing pain in her legs as well as related to her headaches and neck.  Husband has noticed that she will intermittently go into these dissociative episodes where she is discombobulated and confused as well as sedated.  These may last a day or 2 and then as quickly as they come, they go.  He asked if it could be medication related.  He also feels that she might be depressed given her diagnosis and problems.  He went ahead and reduced her gabapentin to 600 mg 3 times daily with the idea of decreasing some of her neuro sedating medication.  He has maintained her other medications as prescribed.     Pain Inventory Average Pain 7 Pain Right Now 9 My pain is constant, sharp, burning and aching  In the last 24 hours, has pain interfered with the following? General activity 9 Relation with others 9 Enjoyment of life 9 What TIME of day is your pain at its worst? daytime Sleep (in general) Good  Pain is worse with: bending Pain improves with: rest, heat/ice, pacing activities and medication Relief from Meds: 7  Family History  Problem Relation Age of Onset  . Colon cancer Mother 24  . Heart disease Father   . Diabetes Father   . Diabetes Paternal Grandmother   . Heart disease Paternal Grandfather   . Diverticulitis Daughter    Social History   Socioeconomic History  .  Marital status: Married    Spouse name: Not on file  . Number of children: 3  . Years of education: Not on file  . Highest education level: Not on file  Occupational History  . Occupation: disabled    Associate Professor: UNEMPLOYED  Tobacco Use  . Smoking status: Never Smoker  . Smokeless tobacco: Never Used  Vaping Use  . Vaping Use: Never used  Substance and Sexual Activity  . Alcohol use: No  . Drug use: No  . Sexual activity: Yes    Birth control/protection: Post-menopausal  Other Topics Concern  . Not on file  Social History Narrative   Lives at home with husband    Right handed   Caffeine: none   Social Determinants of Health   Financial Resource Strain: Not on file  Food Insecurity: Not on file  Transportation Needs: Not on file  Physical Activity: Not on file  Stress: Not on file  Social Connections: Not on file   Past Surgical History:  Procedure Laterality Date  . BREAST LUMPECTOMY     left  . CATARACT EXTRACTION W/PHACO Left 10/07/2016   Procedure: CATARACT EXTRACTION PHACO AND INTRAOCULAR LENS PLACEMENT (IOC);  Surgeon: Gemma Payor, MD;  [facility];  Service: Ophthalmology;  Laterality: Left;  CDE: 4.98  . CATARACT EXTRACTION W/PHACO Right 10/21/2016   Procedure: CATARACT EXTRACTION PHACO AND INTRAOCULAR LENS PLACEMENT RIGHT EYE CDE -  6.24;  Surgeon: Gemma Payor, MD;  [facility]: [facility];  Service: Ophthalmology;  Laterality: Right;  right  . CHOLECYSTECTOMY    . COLONOSCOPY  11/17/08   ext hemorrhoids  . CRANIOTOMY     secondary TBI  . CYST EXCISION     left wrist  . ESOPHAGOGASTRODUODENOSCOPY  04/05/10   [name] (73F),erosive reflux esophagitis, small hiatal hernia,  . ESOPHAGOGASTRODUODENOSCOPY (EGD) WITH PROPOFOL N/A 08/30/2019   Procedure: ESOPHAGOGASTRODUODENOSCOPY (EGD) WITH PROPOFOL;  Surgeon: Corbin Ade, MD;  [facility];  Service: Endoscopy;  Laterality: N/A;  3:30pm - pt knows to arrive at 9:45  . EXTRACORPOREAL SHOCK WAVE  LITHOTRIPSY Left 12/13/2019   Procedure: EXTRACORPOREAL SHOCK WAVE LITHOTRIPSY (ESWL);  Surgeon: Jerilee Field, MD;  Location: [facility];  Service: Urology;  Laterality: Left;  . FINGER SURGERY     removal cyst left pinky  . gastro empy study  03/03/10   normal  . HAND SURGERY    . MALONEY DILATION N/A 08/30/2019   Procedure: Elease Hashimoto DILATION;  Surgeon: Corbin Ade, MD;  [facility];  Service: Endoscopy;  Laterality: N/A;  . NECK SURGERY  1998  . NEPHROLITHOTOMY  05/13/2012   Procedure: NEPHROLITHOTOMY PERCUTANEOUS;  Surgeon: Marcine Matar, MD;  [facility];  Service: Urology;  Laterality: Left;     . SURAL NERVE BX Right 02/28/2020   Procedure: SURAL NERVE / Quadricep BIOPSY;  Surgeon: Tia Alert, MD;  Location: [facility];  Service: Neurosurgery;  Laterality: Right;   Past Surgical History:  Procedure Laterality Date  . BREAST LUMPECTOMY     left  . CATARACT EXTRACTION W/PHACO Left 10/07/2016   Procedure: CATARACT EXTRACTION PHACO AND INTRAOCULAR LENS PLACEMENT (IOC);  Surgeon: Gemma Payor, MD;  [facility];  Service: Ophthalmology;  Laterality: Left;  CDE: 4.98  . CATARACT EXTRACTION W/PHACO Right 10/21/2016   Procedure: CATARACT EXTRACTION PHACO AND INTRAOCULAR LENS PLACEMENT RIGHT EYE CDE - 6.24;  Surgeon: Gemma Payor, MD;  [facility];  Service: Ophthalmology;  Laterality: Right;  right  . CHOLECYSTECTOMY    . COLONOSCOPY  11/17/08   ext hemorrhoids  . CRANIOTOMY     secondary TBI  . CYST EXCISION     left wrist  . ESOPHAGOGASTRODUODENOSCOPY  04/05/10   [name] (73F),erosive reflux esophagitis, small hiatal hernia,  . ESOPHAGOGASTRODUODENOSCOPY (EGD) WITH PROPOFOL N/A 08/30/2019   Procedure: ESOPHAGOGASTRODUODENOSCOPY (EGD) WITH PROPOFOL;  Surgeon: Corbin Ade, MD;  [facility];  Service: Endoscopy;  Laterality: N/A;  3:30pm - pt knows to arrive at 9:45  . EXTRACORPOREAL SHOCK WAVE LITHOTRIPSY Left 12/13/2019    Procedure: EXTRACORPOREAL SHOCK WAVE LITHOTRIPSY (ESWL);  Surgeon: Jerilee Field, MD;  Location: [facility];  Service: Urology;  Laterality: Left;  . FINGER SURGERY     removal cyst left pinky  . gastro empy study  03/03/10   normal  . HAND SURGERY    . MALONEY DILATION N/A 08/30/2019   Procedure: Elease Hashimoto DILATION;  Surgeon: Corbin Ade, MD;  [facility];  Service: Endoscopy;  Laterality: N/A;  . NECK SURGERY  1998  . NEPHROLITHOTOMY  05/13/2012   Procedure: NEPHROLITHOTOMY PERCUTANEOUS;  Surgeon: Marcine Matar, MD;  [facility];  Service: Urology;  Laterality: Left;     . SURAL NERVE BX Right 02/28/2020   Procedure: SURAL NERVE / Quadricep BIOPSY;  Surgeon: Tia Alert, MD;  Location: [facility];  Service: Neurosurgery;  Laterality: Right;  Past Medical History:  Diagnosis Date  . Arthritis   . Cervical facet joint syndrome 04/22/2012  . Cervicalgia   . Chronic headaches    Dr Hermelinda Medicus [place] Pain [facility]ist  . Chronic nausea    normal GES   . Complication of anesthesia    had problem with local anesthesia-tried to assist Physician  . DM (diabetes mellitus) (HCC)   . Elevated liver enzymes    hx of, normal 03/2010  . GERD (gastroesophageal reflux disease) 05/31/2011   History Schatzki's ring, erosive reflux esophagitis, status post EGD and dilation 8/11 and 11/20  . Glaucoma    lens in both eyes  . Hemorrhoids 11/17/08   Colonoscopy Dr Karilyn Cota  . Occipital neuralgia   . PTSD (post-traumatic stress disorder)   . Renal lithiasis   . 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 Torticollis   . Unspecified musculoskeletal disorders and symptoms referable to neck    cervical/trapezius   BP 109/67   Pulse 68   Temp 98 F (36.7 C)   SpO2 93%   Opioid Risk Score:   Fall Risk Score:  `1  Depression screen PHQ 2/9  Depression screen [facility] 2/9 08/02/2020 05/04/2020 04/04/2020 01/11/2020 10/29/2019 10/22/2019 07/16/2019  Decreased Interest 1 0 0 0  0 0 0  Down, Depressed, Hopeless 1 0 3 1 0 0 0  PHQ - 2 Score 2 0 3 1 0 0 0  Altered sleeping - - 3 - - - -  Tired, decreased energy - - 3 - - - -  Change in appetite - - 3 - - - -  Feeling bad or failure about yourself  - - 0 - - - -  Trouble concentrating - - 3 - - - -  Moving slowly or fidgety/restless - - 0 - - - -  Suicidal thoughts - - 1 - - - -  PHQ-9 Score - - 16 - - - -  Difficult doing work/chores - - - - - - -  Some recent data might be hidden     Review of Systems  Musculoskeletal:       Arm pain  Neurological: Positive for headaches.       Objective:   Physical Exam General: No acute distress HEENT: EOMI, oral membranes moist Cards: reg rate  Chest: normal effort Abdomen: Soft, NT, ND Skin: dry, intact Extremities: no edema Psych: cooperative. Appears fatigued Neuro: slow to react. Is oriented to person, place. Upper extremity motor is 5 out of 5.  RLE 3-/5 prox with KE, HF, HAD/HAB to trace distally. LLE 2/5 prox to trace-0/5 distally.   Reflexes remain absent. sensation appears intact in RUE but appears diminished LLE along radial aspect of hand as well as most of arm  Musculoskeletal: LE muscle wasting.  Still present Psych: Appears to be in good spirits today.         Assessment & Plan:  1.  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  Patient still with significant pain in both legs right more than left with associated motor and sensory loss.             -She has been diagnosed with suspected CIDP by [facility].  However, she was demonstrating some improvement in her right leg prior to their treatment and has had further improvement in the right leg and now her left leg since I last saw her.  She continues to demonstrate improvement in her legs. Marland Kitchen  2.  Chronic migraine headaches 3.  Chronic cervical spondylosis with facet arthropathy and associated cervicalgia and headaches 4.  Diabetes type II: Still inadequately  controlled 5. Bilateral LE edema. Some improvement 6.  Constipation/ neurogenic bowel 7. Reactive depression?   Plan:  1  continue home health therapies.  I expressed to the patient and her husband that she has to be willing to work to regain mobility especially in her trunk musculature  2.  Refilled her morphine 20 mg 3 times daily per baseline regimen  -Discussed with husband that I would like to wean her down to 15 mg 3 times a day over the next 4 to 6 weeks.            We will continue the controlled substance monitoring program, this consists of regular clinic visits, examinations, routine drug screening, pill counts as well as use of [place] Controlled Substance Reporting System. [place] was [place] today.     Medication was refilled and a second prescription was sent to the patient's pharmacy for next month.   3.  continue Nucynta 75 mg every 12 hours as needed to better control neuropathic pain.  Refill today 4.   Gabapentin to 600 mg 3 times daily---continue at this reduced dosing 5.    Maintain nortriptyline   50 mg at bedtime 6.  [facility] for C[facility]P.  Given the symptoms presenting today in the left upper extremity, I asked her husband to [facility] if her symptoms are persistent in the left arm 7. Naproxen as needed only for severe pain given ARF 8.  Bowel program with bid miralax, hs senna-s and am suppository 9.  Mood: She definitely has taken a step backwards in this direction.  While I did understand the husband decreasing her gabapentin, it would not explain why she is fine 1 day and then going into a spell of confusion and lethargy for the following 2 days.  Would like to get her back into see Dr. Kieth Brightlyodenbough for coping skills and mood reassessment.  She has seen Dr. Archer AsaGerald Plovsky in the past as well for psychiatry.  I provided the husband, Dr. Caprice RenshawPlovsky's number    30 minutes of face to face patient care time were spent during this visit including  counseling with husband. All questions were encouraged and answered. Follow up with me in 2 mos.

## 2020-09-27 NOTE — Patient Instructions (Addendum)
Archer Asa MD Psychotherapist in [place], [place] Address: [address], [place], [place] [address] Phone: [phone]   If your Left arm remains numb tomorrow, I would recommend that you contact [facility] Neurology   Decrease methadone by 5mg  per day every 2 weeks to where you're taking 15mg  three x daily after 4 weeks time.

## 2020-09-28 DIAGNOSIS — L89622 Pressure ulcer of left heel, stage 2: Secondary | ICD-10-CM | POA: Diagnosis not present

## 2020-09-28 DIAGNOSIS — E1139 Type 2 diabetes mellitus with other diabetic ophthalmic complication: Secondary | ICD-10-CM | POA: Diagnosis not present

## 2020-09-28 DIAGNOSIS — L89152 Pressure ulcer of sacral region, stage 2: Secondary | ICD-10-CM | POA: Diagnosis not present

## 2020-09-28 DIAGNOSIS — E1165 Type 2 diabetes mellitus with hyperglycemia: Secondary | ICD-10-CM | POA: Diagnosis not present

## 2020-09-28 DIAGNOSIS — L89612 Pressure ulcer of right heel, stage 2: Secondary | ICD-10-CM | POA: Diagnosis not present

## 2020-09-28 DIAGNOSIS — E1144 Type 2 diabetes mellitus with diabetic amyotrophy: Secondary | ICD-10-CM | POA: Diagnosis not present

## 2020-09-30 DIAGNOSIS — L89622 Pressure ulcer of left heel, stage 2: Secondary | ICD-10-CM | POA: Diagnosis not present

## 2020-09-30 DIAGNOSIS — Z7984 Long term (current) use of oral hypoglycemic drugs: Secondary | ICD-10-CM | POA: Diagnosis not present

## 2020-09-30 DIAGNOSIS — L89612 Pressure ulcer of right heel, stage 2: Secondary | ICD-10-CM | POA: Diagnosis not present

## 2020-09-30 DIAGNOSIS — M542 Cervicalgia: Secondary | ICD-10-CM | POA: Diagnosis not present

## 2020-09-30 DIAGNOSIS — Z794 Long term (current) use of insulin: Secondary | ICD-10-CM | POA: Diagnosis not present

## 2020-09-30 DIAGNOSIS — L89152 Pressure ulcer of sacral region, stage 2: Secondary | ICD-10-CM | POA: Diagnosis not present

## 2020-09-30 DIAGNOSIS — H409 Unspecified glaucoma: Secondary | ICD-10-CM | POA: Diagnosis not present

## 2020-09-30 DIAGNOSIS — E1165 Type 2 diabetes mellitus with hyperglycemia: Secondary | ICD-10-CM | POA: Diagnosis not present

## 2020-09-30 DIAGNOSIS — E1139 Type 2 diabetes mellitus with other diabetic ophthalmic complication: Secondary | ICD-10-CM | POA: Diagnosis not present

## 2020-09-30 DIAGNOSIS — E1144 Type 2 diabetes mellitus with diabetic amyotrophy: Secondary | ICD-10-CM | POA: Diagnosis not present

## 2020-10-01 LAB — DRUG TOX MONITOR 1 W/CONF, ORAL FLD
Amphetamines: NEGATIVE ng/mL (ref <10)
Barbiturates: NEGATIVE ng/mL (ref <10)
Benzodiazepines: NEGATIVE ng/mL (ref <0.50)
Buprenorphine: NEGATIVE ng/mL (ref <0.10)
Cocaine: NEGATIVE ng/mL (ref <5.0)
EDDP: NEGATIVE ng/mL (ref <5.0)
Fentanyl: NEGATIVE ng/mL (ref <0.10)
Heroin Metabolite: NEGATIVE ng/mL (ref <1.0)
MARIJUANA: NEGATIVE ng/mL (ref <2.5)
MDMA: NEGATIVE ng/mL (ref <10)
Meprobamate: NEGATIVE ng/mL (ref <2.5)
Methadone: 286.5 ng/mL — ABNORMAL HIGH (ref <5.0)
Methadone: POSITIVE ng/mL — AB (ref <5.0)
Nicotine Metabolite: NEGATIVE ng/mL (ref <5.0)
Opiates: NEGATIVE ng/mL (ref <2.5)
Phencyclidine: NEGATIVE ng/mL (ref <10)
Tapentadol: 99.9 ng/mL — ABNORMAL HIGH (ref <5.0)
Tapentadol: POSITIVE ng/mL — AB (ref <5.0)
Tramadol: NEGATIVE ng/mL (ref <5.0)
Zolpidem: NEGATIVE ng/mL (ref <5.0)

## 2020-10-01 LAB — DRUG TOX ALC METAB W/CON, ORAL FLD: Alcohol Metabolite: NEGATIVE ng/mL (ref <25)

## 2020-10-04 ENCOUNTER — Telehealth: Payer: Self-pay | Admitting: *Deleted

## 2020-10-04 DIAGNOSIS — E1139 Type 2 diabetes mellitus with other diabetic ophthalmic complication: Secondary | ICD-10-CM | POA: Diagnosis not present

## 2020-10-04 DIAGNOSIS — E1165 Type 2 diabetes mellitus with hyperglycemia: Secondary | ICD-10-CM | POA: Diagnosis not present

## 2020-10-04 DIAGNOSIS — E1144 Type 2 diabetes mellitus with diabetic amyotrophy: Secondary | ICD-10-CM | POA: Diagnosis not present

## 2020-10-04 DIAGNOSIS — L89152 Pressure ulcer of sacral region, stage 2: Secondary | ICD-10-CM | POA: Diagnosis not present

## 2020-10-04 DIAGNOSIS — L89612 Pressure ulcer of right heel, stage 2: Secondary | ICD-10-CM | POA: Diagnosis not present

## 2020-10-04 DIAGNOSIS — L89622 Pressure ulcer of left heel, stage 2: Secondary | ICD-10-CM | POA: Diagnosis not present

## 2020-10-04 NOTE — Telephone Encounter (Signed)
Oral swab drug screen was consistent for prescribed medications.  ?

## 2020-10-05 ENCOUNTER — Other Ambulatory Visit: Payer: Self-pay | Admitting: Family Medicine

## 2020-10-05 ENCOUNTER — Other Ambulatory Visit: Payer: Self-pay | Admitting: Physical Medicine & Rehabilitation

## 2020-10-05 DIAGNOSIS — G4486 Cervicogenic headache: Secondary | ICD-10-CM

## 2020-10-05 DIAGNOSIS — G43711 Chronic migraine without aura, intractable, with status migrainosus: Secondary | ICD-10-CM

## 2020-10-05 DIAGNOSIS — E1144 Type 2 diabetes mellitus with diabetic amyotrophy: Secondary | ICD-10-CM

## 2020-10-05 DIAGNOSIS — E1142 Type 2 diabetes mellitus with diabetic polyneuropathy: Secondary | ICD-10-CM

## 2020-10-09 DIAGNOSIS — E1165 Type 2 diabetes mellitus with hyperglycemia: Secondary | ICD-10-CM | POA: Diagnosis not present

## 2020-10-10 DIAGNOSIS — E1144 Type 2 diabetes mellitus with diabetic amyotrophy: Secondary | ICD-10-CM | POA: Diagnosis not present

## 2020-10-10 DIAGNOSIS — E1165 Type 2 diabetes mellitus with hyperglycemia: Secondary | ICD-10-CM | POA: Diagnosis not present

## 2020-10-10 DIAGNOSIS — L89152 Pressure ulcer of sacral region, stage 2: Secondary | ICD-10-CM | POA: Diagnosis not present

## 2020-10-10 DIAGNOSIS — L89622 Pressure ulcer of left heel, stage 2: Secondary | ICD-10-CM | POA: Diagnosis not present

## 2020-10-10 DIAGNOSIS — E1139 Type 2 diabetes mellitus with other diabetic ophthalmic complication: Secondary | ICD-10-CM | POA: Diagnosis not present

## 2020-10-10 DIAGNOSIS — L89612 Pressure ulcer of right heel, stage 2: Secondary | ICD-10-CM | POA: Diagnosis not present

## 2020-10-17 DIAGNOSIS — L89612 Pressure ulcer of right heel, stage 2: Secondary | ICD-10-CM | POA: Diagnosis not present

## 2020-10-17 DIAGNOSIS — L89622 Pressure ulcer of left heel, stage 2: Secondary | ICD-10-CM | POA: Diagnosis not present

## 2020-10-17 DIAGNOSIS — E1144 Type 2 diabetes mellitus with diabetic amyotrophy: Secondary | ICD-10-CM | POA: Diagnosis not present

## 2020-10-17 DIAGNOSIS — L89152 Pressure ulcer of sacral region, stage 2: Secondary | ICD-10-CM | POA: Diagnosis not present

## 2020-10-17 DIAGNOSIS — E1165 Type 2 diabetes mellitus with hyperglycemia: Secondary | ICD-10-CM | POA: Diagnosis not present

## 2020-10-17 DIAGNOSIS — E1139 Type 2 diabetes mellitus with other diabetic ophthalmic complication: Secondary | ICD-10-CM | POA: Diagnosis not present

## 2020-10-19 DIAGNOSIS — L89152 Pressure ulcer of sacral region, stage 2: Secondary | ICD-10-CM | POA: Diagnosis not present

## 2020-10-19 DIAGNOSIS — L89622 Pressure ulcer of left heel, stage 2: Secondary | ICD-10-CM | POA: Diagnosis not present

## 2020-10-19 DIAGNOSIS — E1144 Type 2 diabetes mellitus with diabetic amyotrophy: Secondary | ICD-10-CM | POA: Diagnosis not present

## 2020-10-19 DIAGNOSIS — L89612 Pressure ulcer of right heel, stage 2: Secondary | ICD-10-CM | POA: Diagnosis not present

## 2020-10-19 DIAGNOSIS — E1139 Type 2 diabetes mellitus with other diabetic ophthalmic complication: Secondary | ICD-10-CM | POA: Diagnosis not present

## 2020-10-19 DIAGNOSIS — E1165 Type 2 diabetes mellitus with hyperglycemia: Secondary | ICD-10-CM | POA: Diagnosis not present

## 2020-10-21 DIAGNOSIS — L89152 Pressure ulcer of sacral region, stage 2: Secondary | ICD-10-CM | POA: Diagnosis not present

## 2020-10-21 DIAGNOSIS — E1165 Type 2 diabetes mellitus with hyperglycemia: Secondary | ICD-10-CM | POA: Diagnosis not present

## 2020-10-21 DIAGNOSIS — L89622 Pressure ulcer of left heel, stage 2: Secondary | ICD-10-CM | POA: Diagnosis not present

## 2020-10-21 DIAGNOSIS — E1139 Type 2 diabetes mellitus with other diabetic ophthalmic complication: Secondary | ICD-10-CM | POA: Diagnosis not present

## 2020-10-21 DIAGNOSIS — L89612 Pressure ulcer of right heel, stage 2: Secondary | ICD-10-CM | POA: Diagnosis not present

## 2020-10-21 DIAGNOSIS — E1144 Type 2 diabetes mellitus with diabetic amyotrophy: Secondary | ICD-10-CM | POA: Diagnosis not present

## 2020-10-23 DIAGNOSIS — E1144 Type 2 diabetes mellitus with diabetic amyotrophy: Secondary | ICD-10-CM | POA: Diagnosis not present

## 2020-10-23 DIAGNOSIS — R200 Anesthesia of skin: Secondary | ICD-10-CM | POA: Diagnosis not present

## 2020-10-23 DIAGNOSIS — E119 Type 2 diabetes mellitus without complications: Secondary | ICD-10-CM | POA: Diagnosis not present

## 2020-10-23 DIAGNOSIS — Z7984 Long term (current) use of oral hypoglycemic drugs: Secondary | ICD-10-CM | POA: Diagnosis not present

## 2020-10-23 DIAGNOSIS — Z885 Allergy status to narcotic agent status: Secondary | ICD-10-CM | POA: Diagnosis not present

## 2020-10-23 DIAGNOSIS — Z886 Allergy status to analgesic agent status: Secondary | ICD-10-CM | POA: Diagnosis not present

## 2020-10-23 DIAGNOSIS — G6181 Chronic inflammatory demyelinating polyneuritis: Secondary | ICD-10-CM | POA: Diagnosis not present

## 2020-10-23 DIAGNOSIS — R29898 Other symptoms and signs involving the musculoskeletal system: Secondary | ICD-10-CM | POA: Diagnosis not present

## 2020-10-23 DIAGNOSIS — Z794 Long term (current) use of insulin: Secondary | ICD-10-CM | POA: Diagnosis not present

## 2020-10-23 DIAGNOSIS — Z888 Allergy status to other drugs, medicaments and biological substances status: Secondary | ICD-10-CM | POA: Diagnosis not present

## 2020-10-24 DIAGNOSIS — E1139 Type 2 diabetes mellitus with other diabetic ophthalmic complication: Secondary | ICD-10-CM | POA: Diagnosis not present

## 2020-10-24 DIAGNOSIS — E1165 Type 2 diabetes mellitus with hyperglycemia: Secondary | ICD-10-CM | POA: Diagnosis not present

## 2020-10-24 DIAGNOSIS — L89152 Pressure ulcer of sacral region, stage 2: Secondary | ICD-10-CM | POA: Diagnosis not present

## 2020-10-24 DIAGNOSIS — L89612 Pressure ulcer of right heel, stage 2: Secondary | ICD-10-CM | POA: Diagnosis not present

## 2020-10-24 DIAGNOSIS — E1144 Type 2 diabetes mellitus with diabetic amyotrophy: Secondary | ICD-10-CM | POA: Diagnosis not present

## 2020-10-24 DIAGNOSIS — L89622 Pressure ulcer of left heel, stage 2: Secondary | ICD-10-CM | POA: Diagnosis not present

## 2020-10-25 ENCOUNTER — Ambulatory Visit: Payer: Medicare Other | Admitting: Family Medicine

## 2020-10-25 DIAGNOSIS — E1144 Type 2 diabetes mellitus with diabetic amyotrophy: Secondary | ICD-10-CM | POA: Diagnosis not present

## 2020-10-25 DIAGNOSIS — L89612 Pressure ulcer of right heel, stage 2: Secondary | ICD-10-CM | POA: Diagnosis not present

## 2020-10-25 DIAGNOSIS — E1165 Type 2 diabetes mellitus with hyperglycemia: Secondary | ICD-10-CM | POA: Diagnosis not present

## 2020-10-25 DIAGNOSIS — E1139 Type 2 diabetes mellitus with other diabetic ophthalmic complication: Secondary | ICD-10-CM | POA: Diagnosis not present

## 2020-10-25 DIAGNOSIS — L89622 Pressure ulcer of left heel, stage 2: Secondary | ICD-10-CM | POA: Diagnosis not present

## 2020-10-25 DIAGNOSIS — L89152 Pressure ulcer of sacral region, stage 2: Secondary | ICD-10-CM | POA: Diagnosis not present

## 2020-10-26 ENCOUNTER — Ambulatory Visit: Payer: Medicare Other | Admitting: Family Medicine

## 2020-10-26 ENCOUNTER — Encounter: Payer: Worker's Compensation | Admitting: Psychology

## 2020-10-27 DIAGNOSIS — E1139 Type 2 diabetes mellitus with other diabetic ophthalmic complication: Secondary | ICD-10-CM | POA: Diagnosis not present

## 2020-10-27 DIAGNOSIS — E1165 Type 2 diabetes mellitus with hyperglycemia: Secondary | ICD-10-CM | POA: Diagnosis not present

## 2020-10-27 DIAGNOSIS — L89612 Pressure ulcer of right heel, stage 2: Secondary | ICD-10-CM | POA: Diagnosis not present

## 2020-10-27 DIAGNOSIS — E1144 Type 2 diabetes mellitus with diabetic amyotrophy: Secondary | ICD-10-CM | POA: Diagnosis not present

## 2020-10-27 DIAGNOSIS — L89152 Pressure ulcer of sacral region, stage 2: Secondary | ICD-10-CM | POA: Diagnosis not present

## 2020-10-27 DIAGNOSIS — L89622 Pressure ulcer of left heel, stage 2: Secondary | ICD-10-CM | POA: Diagnosis not present

## 2020-10-30 DIAGNOSIS — Z794 Long term (current) use of insulin: Secondary | ICD-10-CM | POA: Diagnosis not present

## 2020-10-30 DIAGNOSIS — E1144 Type 2 diabetes mellitus with diabetic amyotrophy: Secondary | ICD-10-CM | POA: Diagnosis not present

## 2020-10-30 DIAGNOSIS — M542 Cervicalgia: Secondary | ICD-10-CM | POA: Diagnosis not present

## 2020-10-30 DIAGNOSIS — E1139 Type 2 diabetes mellitus with other diabetic ophthalmic complication: Secondary | ICD-10-CM | POA: Diagnosis not present

## 2020-10-30 DIAGNOSIS — H409 Unspecified glaucoma: Secondary | ICD-10-CM | POA: Diagnosis not present

## 2020-10-30 DIAGNOSIS — Z7984 Long term (current) use of oral hypoglycemic drugs: Secondary | ICD-10-CM | POA: Diagnosis not present

## 2020-10-30 DIAGNOSIS — E1165 Type 2 diabetes mellitus with hyperglycemia: Secondary | ICD-10-CM | POA: Diagnosis not present

## 2020-10-30 DIAGNOSIS — L89622 Pressure ulcer of left heel, stage 2: Secondary | ICD-10-CM | POA: Diagnosis not present

## 2020-10-30 DIAGNOSIS — L89152 Pressure ulcer of sacral region, stage 2: Secondary | ICD-10-CM | POA: Diagnosis not present

## 2020-10-30 DIAGNOSIS — L89612 Pressure ulcer of right heel, stage 2: Secondary | ICD-10-CM | POA: Diagnosis not present

## 2020-10-31 ENCOUNTER — Telehealth (INDEPENDENT_AMBULATORY_CARE_PROVIDER_SITE_OTHER): Payer: Medicare Other | Admitting: Nurse Practitioner

## 2020-10-31 ENCOUNTER — Encounter: Payer: Self-pay | Admitting: Nurse Practitioner

## 2020-10-31 DIAGNOSIS — E1139 Type 2 diabetes mellitus with other diabetic ophthalmic complication: Secondary | ICD-10-CM | POA: Diagnosis not present

## 2020-10-31 DIAGNOSIS — E1144 Type 2 diabetes mellitus with diabetic amyotrophy: Secondary | ICD-10-CM | POA: Diagnosis not present

## 2020-10-31 DIAGNOSIS — L89612 Pressure ulcer of right heel, stage 2: Secondary | ICD-10-CM | POA: Diagnosis not present

## 2020-10-31 DIAGNOSIS — R300 Dysuria: Secondary | ICD-10-CM | POA: Diagnosis not present

## 2020-10-31 DIAGNOSIS — L89152 Pressure ulcer of sacral region, stage 2: Secondary | ICD-10-CM | POA: Diagnosis not present

## 2020-10-31 DIAGNOSIS — E1165 Type 2 diabetes mellitus with hyperglycemia: Secondary | ICD-10-CM | POA: Diagnosis not present

## 2020-10-31 DIAGNOSIS — L89622 Pressure ulcer of left heel, stage 2: Secondary | ICD-10-CM | POA: Diagnosis not present

## 2020-10-31 LAB — URINALYSIS, MICROSCOPIC ONLY: WBC, UA: >30 /hpf (ref 0–5)

## 2020-10-31 MED ORDER — SULFAMETHOXAZOLE-TRIMETHOPRIM 800-160 MG PO TABS
1.0000 | ORAL_TABLET | Freq: Two times a day (BID) | ORAL | 0 refills | Status: DC
Start: 2020-10-31 — End: 2020-11-29

## 2020-10-31 NOTE — Progress Notes (Signed)
Virtual Visit via Video Note   This visit type was conducted due to national recommendations for restrictions regarding the COVID-19 Pandemic (e.g. social distancing) in an effort to limit this patient's exposure and mitigate transmission in our community.  Due to her co-morbid illnesses, this patient is at least at moderate risk for complications without adequate follow up.  This format is felt to be most appropriate for this patient at this time.  All issues noted in this document were discussed and addressed.  A limited physical exam was performed with this format.  A verbal consent was obtained for the virtual visit.   Date:  10/31/2020   ID:  Monica, [name] 1951/04/30, MRN [id number]  Patient Location: Home Provider Location: Office/Clinic  PCP:  [name], FNP   Evaluation Performed:  New Patient Evaluation  Chief Complaint:  Dysuria  History of Present Illness:    Monica [name] is a 70 y.o. female with Dysuria  The current episode started in the past 7 days. The problem has been gradually worsening. The quality of the pain is described as burning. The pain is moderate. The maximum temperature recorded prior to her arrival was 100 - 100.9 F. The fever has been present for less than 1 day. There is no history of pyelonephritis. Associated symptoms include chills, flank pain, frequency, nausea and urgency. She has tried nothing for the symptoms.    The patient does not have symptoms concerning for COVID-19 infection (fever, chills, cough, or new shortness of breath).    Past Medical History:  Diagnosis Date  . Arthritis   . Cervical facet joint syndrome 04/22/2012  . Cervicalgia   . Chronic headaches    Dr Hermelinda [facility]us [place] [facility]  . Chronic nausea    normal GES   . Complication of anesthesia    had problem with local anesthesia-tried to assist Physician  . DM (diabetes mellitus) (HCC)   . Elevated liver enzymes    hx of, normal 03/2010  . GERD  (gastroesophageal reflux disease) 05/31/2011   History Schatzki's ring, erosive reflux esophagitis, status post EGD and dilation 8/11 and 11/20  . Glaucoma    lens in both eyes  . Hemorrhoids 11/17/08   Colonoscopy Dr Karilyn Cota  . Occipital neuralgia   . PTSD (post-traumatic stress disorder)   . Renal lithiasis   . 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 Torticollis   . Unspecified musculoskeletal disorders and symptoms referable to neck    cervical/trapezius    Past Surgical History:  Procedure Laterality Date  . BREAST LUMPECTOMY     left  . CATARACT EXTRACTION W/PHACO Left 10/07/2016   Procedure: CATARACT EXTRACTION PHACO AND INTRAOCULAR LENS PLACEMENT (IOC);  Surgeon: Gemma Payor, MD;  Location: [facility];  Service: Ophthalmology;  Laterality: Left;  CDE: 4.98  . CATARACT EXTRACTION W/PHACO Right 10/21/2016   Procedure: CATARACT EXTRACTION PHACO AND INTRAOCULAR LENS PLACEMENT RIGHT EYE CDE - 6.24;  Surgeon: Gemma Payor, MD;  Location: [facility];  Service: Ophthalmology;  Laterality: Right;  right  . CHOLECYSTECTOMY    . COLONOSCOPY  11/17/08   ext hemorrhoids  . CRANIOTOMY     secondary TBI  . CYST EXCISION     left wrist  . 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 ESOPHAGOGASTRODUODENOSCOPY (EGD) WITH PROPOFOL N/A 08/30/2019   Procedure: ESOPHAGOGASTRODUODENOSCOPY (EGD) WITH PROPOFOL;  Surgeon: Corbin Ade, MD;  Location: [facility];  Service: Endoscopy;  Laterality: N/A;  3:30pm - pt knows to arrive at 9:45  . EXTRACORPOREAL SHOCK WAVE LITHOTRIPSY Left 12/13/2019   Procedure: EXTRACORPOREAL SHOCK WAVE LITHOTRIPSY (ESWL);  Surgeon: Jerilee Field, MD;  Location: [facility];  Service: Urology;  Laterality: Left;  . FINGER SURGERY     removal cyst left pinky  . gastro empy study  03/03/10   normal  . HAND SURGERY    . MALONEY DILATION N/A 08/30/2019   Procedure: Elease Hashimoto DILATION;  Surgeon:  Corbin Ade, MD;  Location: [facility];  Service: Endoscopy;  Laterality: N/A;  . NECK SURGERY  1998  . NEPHROLITHOTOMY  05/13/2012   Procedure: NEPHROLITHOTOMY PERCUTANEOUS;  Surgeon: Marcine Matar, MD;  Location: [facility];  Service: Urology;  Laterality: Left;     . SURAL NERVE BX Right 02/28/2020   Procedure: SURAL NERVE / Quadricep BIOPSY;  Surgeon: Tia Alert, MD;  Location: [facility];  Service: Neurosurgery;  Laterality: Right;    Family History  Problem Relation Age of Onset  . Colon cancer Mother 36  . Heart disease Father   . Diabetes Father   . Diabetes Paternal Grandmother   . Heart disease Paternal Grandfather   . Diverticulitis Daughter     Social History   Socioeconomic History  . Marital status: Married    Spouse name: Not on file  . Number of children: 3  . Years of education: Not on file  . Highest education level: Not on file  Occupational History  . Occupation: disabled    Associate Professor: UNEMPLOYED  Tobacco Use  . Smoking status: Never Smoker  . Smokeless tobacco: Never Used  Vaping Use  . Vaping Use: Never used  Substance and Sexual Activity  . Alcohol use: No  . Drug use: No  . Sexual activity: Yes    Birth control/protection: Post-menopausal  Other Topics Concern  . Not on file  Social History Narrative   Lives at home with husband    Right handed   Caffeine: none   Social Determinants of Health   Financial Resource Strain: Not on file  Food Insecurity: Not on file  Transportation Needs: Not on file  Physical Activity: Not on file  Stress: Not on file  Social Connections: Not on file  Intimate Partner Violence: Not on file    Outpatient Medications Prior to Visit  Medication Sig Dispense Refill  . acetaminophen (TYLENOL) 325 MG tablet Take 1-2 tablets (325-650 mg total) by mouth every 4 (four) hours as needed for mild pain.    Marland Kitchen atorvastatin (LIPITOR) 10 MG tablet TAKE 1 TABLET BY MOUTH ONCE DAILY. 90 tablet 1  . bisacodyl  (BISACODYL) 5 MG EC tablet Take 2 tablets (10 mg total) by mouth daily. 60 tablet 0  . clobetasol ointment (TEMOVATE) 0.05 % clobetasol 0.05 % topical ointment  APPLY A THIN LAYER TO THE AFFECTED AREA(S) BY TOPICAL ROUTE once every night x 2 weeks and then space it out to twice a week therafter.    . cycloSPORINE (RESTASIS) 0.05 % ophthalmic emulsion Restasis 0.05 % eye drops in a dropperette    . gabapentin (NEURONTIN) 600 MG tablet TAKE 1 TABLET 3 TIMES DAILY AND 2 TABLETS AT 8 PM DAILY. 150 tablet 2  . IMMUNE GLOBULIN 10% 10 GM/100ML SOLN Inject into the vein.    Marland Kitchen insulin lispro (HUMALOG) 100 UNIT/ML KwikPen     . Insulin Pen Needle (GLOBAL EASE INJECT PEN NEEDLES) 31G X 5  MM MISC Use 1 pen needle every morning, noon, in the evening and at bedtime Dx E11.65 100 each 11  . LANTUS SOLOSTAR 100 UNIT/ML Solostar Pen INJECT 20 UNITS INTO THE SKIN TWICE DAILY. 15 mL 0  . lidocaine (LIDODERM) 5 % Place 2 patches onto the skin daily. Apply at 8 am and remove at 8 pm--purchase over the counter. 30 patch 0  . linaclotide (LINZESS) 145 MCG CAPS capsule Take 1 capsule (145 mcg total) by mouth daily before breakfast. 90 capsule 1  . magnesium oxide (MAG-OX) 400 (241.3 Mg) MG tablet Take 1 tablet (400 mg total) by mouth 2 (two) times daily. (Patient not taking: No sig reported) 60 tablet 2  . metFORMIN (GLUCOPHAGE-XR) 500 MG 24 hr tablet Take 1 tablet (500 mg total) by mouth 2 (two) times daily with a meal. 180 tablet 1  . methadone (DOLOPHINE) 10 MG tablet TAKE (2) TABLETS BY MOUTH THREE TIMES DAILY. 180 tablet 0  . naproxen (NAPROSYN) 500 MG tablet TAKE 1 TABLET BY MOUTH 2 TIMES A DAY WITH MEALS. (Patient not taking: No sig reported) 60 tablet 0  . nortriptyline (PAMELOR) 50 MG capsule TAKE 1 CAPSULE BY MOUTH AT BEDTIME. 30 capsule 2  . predniSONE (STERAPRED UNI-PAK 21 TAB) 10 MG (21) TBPK tablet prednisone 10 mg tablets in a dose pack    . sulfamethoxazole-trimethoprim (BACTRIM DS) 800-160 MG tablet Take 1  tablet by mouth 2 (two) times daily. 20 tablet 0  . tapentadol HCl (NUCYNTA) 75 MG tablet Take 1 tablet (75 mg total) by mouth every 12 (twelve) hours as needed. 60 tablet 0  . topiramate (TOPAMAX) 50 MG tablet TAKE 2 TABLETS IN THE MORNING AND 4 TABLETS AT BEDTIME. 180 tablet 4   No facility-administered medications prior to visit.    Allergies:   Aspirin, Hydromorphone hcl, Vimpat [lacosamide], Depakote [divalproex sodium], Milnacipran hcl, Pristiq [desvenlafaxine succinate monohydrate], Capsaicin, and Gabapentin   Social History   Tobacco Use  . Smoking status: Never Smoker  . Smokeless tobacco: Never Used  Vaping Use  . Vaping Use: Never used  Substance Use Topics  . Alcohol use: No  . Drug use: No     Review of Systems  Constitutional: Positive for chills and fever.  HENT: Negative.   Respiratory: Negative.   Cardiovascular: Negative.   Gastrointestinal: Positive for nausea.  Genitourinary: Positive for dysuria, flank pain, frequency and urgency.     Labs/Other Tests and Data Reviewed:    Recent Labs: 04/04/2020: ALT 13; BUN 36; Creatinine, Ser 0.75; Hemoglobin 10.3; Magnesium 1.9; Platelets 233; Potassium 4.5; Sodium 134   Recent Lipid Panel Lab Results  Component Value Date/Time   CHOL 147 06/29/2019 03:24 PM   TRIG 174 (H) 06/29/2019 03:24 PM   HDL 36 (L) 06/29/2019 03:24 PM   CHOLHDL 4.1 06/29/2019 03:24 PM   LDLCALC 81 06/29/2019 03:24 PM    Wt Readings from Last 3 Encounters:  04/19/20 132 lb (59.9 kg)  03/10/20 133 lb 13.1 oz (60.7 kg)  02/24/20 132 lb 4.4 oz (60 kg)     Objective:    Vital Signs:  There were no vitals taken for this visit.   Physical Exam   ASSESSMENT & PLAN:   1. Dysuria - Urine Microscopic - Urine Culture - sulfamethoxazole-trimethoprim (BACTRIM DS) 800-160 MG tablet; Take 1 tablet by mouth 2 (two) times daily.  Dispense: 28 tablet; Refill: 0    Orders Placed This Encounter  Procedures  . Urine Culture  . Urine  Microscopic     Meds ordered this encounter  Medications  . sulfamethoxazole-trimethoprim (BACTRIM DS) 800-160 MG tablet    Sig: Take 1 tablet by mouth 2 (two) times daily.    Dispense:  28 tablet    Refill:  0    Order Specific Question:   Supervising Provider    Answer:   Raliegh Ip [[id number]]    COVID-19 Education: The signs and symptoms of COVID-19 were discussed with the patient and how to seek care for testing (follow up with PCP or arrange E-visit). The importance of social distancing was discussed today.  Time:   Today, I have spent 12 minutes with the patient with telehealth technology discussing the above problems.    Follow Up:  Virtual Visit  prn  SignedDaryll Drown, NP  10/31/2020 4:02 PM    [facility]

## 2020-10-31 NOTE — Assessment & Plan Note (Signed)
Dysuria symptoms with pelvic pain, burning, fever lasting less than 24 hours, nausea, foul odor urine with left-sided CVA tenderness in the past 7 days not well controlled.  Provided education to patient to increase hydration.  Reduce caffeinated drinks like coffee, urinalysis obtained with cultures results pending.  Bactrim DS 800-160 mg tablet, 1 tablet twice a day started.  Rx sent to pharmacy follow-up with worsening unresolved symptoms.

## 2020-11-04 DIAGNOSIS — L89152 Pressure ulcer of sacral region, stage 2: Secondary | ICD-10-CM | POA: Diagnosis not present

## 2020-11-04 DIAGNOSIS — E1144 Type 2 diabetes mellitus with diabetic amyotrophy: Secondary | ICD-10-CM | POA: Diagnosis not present

## 2020-11-04 DIAGNOSIS — L89612 Pressure ulcer of right heel, stage 2: Secondary | ICD-10-CM | POA: Diagnosis not present

## 2020-11-04 DIAGNOSIS — L89622 Pressure ulcer of left heel, stage 2: Secondary | ICD-10-CM | POA: Diagnosis not present

## 2020-11-04 DIAGNOSIS — E1139 Type 2 diabetes mellitus with other diabetic ophthalmic complication: Secondary | ICD-10-CM | POA: Diagnosis not present

## 2020-11-04 DIAGNOSIS — E1165 Type 2 diabetes mellitus with hyperglycemia: Secondary | ICD-10-CM | POA: Diagnosis not present

## 2020-11-04 LAB — URINE CULTURE

## 2020-11-05 ENCOUNTER — Other Ambulatory Visit: Payer: Self-pay | Admitting: Nurse Practitioner

## 2020-11-05 DIAGNOSIS — N3000 Acute cystitis without hematuria: Secondary | ICD-10-CM

## 2020-11-05 MED ORDER — AMOXICILLIN-POT CLAVULANATE 875-125 MG PO TABS: 1.0000 | ORAL_TABLET | Freq: Two times a day (BID) | ORAL | 0 refills | Status: DC

## 2020-11-05 MED ORDER — AMOXICILLIN 875 MG PO TABS
875.0000 mg | ORAL_TABLET | Freq: Two times a day (BID) | ORAL | 0 refills | Status: DC
Start: 2020-11-05 — End: 2020-11-05

## 2020-11-06 DIAGNOSIS — L89152 Pressure ulcer of sacral region, stage 2: Secondary | ICD-10-CM | POA: Diagnosis not present

## 2020-11-06 DIAGNOSIS — L89612 Pressure ulcer of right heel, stage 2: Secondary | ICD-10-CM | POA: Diagnosis not present

## 2020-11-06 DIAGNOSIS — E1165 Type 2 diabetes mellitus with hyperglycemia: Secondary | ICD-10-CM | POA: Diagnosis not present

## 2020-11-06 DIAGNOSIS — E1144 Type 2 diabetes mellitus with diabetic amyotrophy: Secondary | ICD-10-CM | POA: Diagnosis not present

## 2020-11-06 DIAGNOSIS — L89622 Pressure ulcer of left heel, stage 2: Secondary | ICD-10-CM | POA: Diagnosis not present

## 2020-11-06 DIAGNOSIS — E1139 Type 2 diabetes mellitus with other diabetic ophthalmic complication: Secondary | ICD-10-CM | POA: Diagnosis not present

## 2020-11-09 DIAGNOSIS — E1165 Type 2 diabetes mellitus with hyperglycemia: Secondary | ICD-10-CM | POA: Diagnosis not present

## 2020-11-09 DIAGNOSIS — L89152 Pressure ulcer of sacral region, stage 2: Secondary | ICD-10-CM | POA: Diagnosis not present

## 2020-11-09 DIAGNOSIS — E1144 Type 2 diabetes mellitus with diabetic amyotrophy: Secondary | ICD-10-CM | POA: Diagnosis not present

## 2020-11-09 DIAGNOSIS — L89612 Pressure ulcer of right heel, stage 2: Secondary | ICD-10-CM | POA: Diagnosis not present

## 2020-11-09 DIAGNOSIS — L89622 Pressure ulcer of left heel, stage 2: Secondary | ICD-10-CM | POA: Diagnosis not present

## 2020-11-09 DIAGNOSIS — E1139 Type 2 diabetes mellitus with other diabetic ophthalmic complication: Secondary | ICD-10-CM | POA: Diagnosis not present

## 2020-11-14 DIAGNOSIS — E1139 Type 2 diabetes mellitus with other diabetic ophthalmic complication: Secondary | ICD-10-CM | POA: Diagnosis not present

## 2020-11-14 DIAGNOSIS — E1165 Type 2 diabetes mellitus with hyperglycemia: Secondary | ICD-10-CM | POA: Diagnosis not present

## 2020-11-14 DIAGNOSIS — E1144 Type 2 diabetes mellitus with diabetic amyotrophy: Secondary | ICD-10-CM | POA: Diagnosis not present

## 2020-11-14 DIAGNOSIS — L89612 Pressure ulcer of right heel, stage 2: Secondary | ICD-10-CM | POA: Diagnosis not present

## 2020-11-14 DIAGNOSIS — L89152 Pressure ulcer of sacral region, stage 2: Secondary | ICD-10-CM | POA: Diagnosis not present

## 2020-11-14 DIAGNOSIS — L89622 Pressure ulcer of left heel, stage 2: Secondary | ICD-10-CM | POA: Diagnosis not present

## 2020-11-15 DIAGNOSIS — L89612 Pressure ulcer of right heel, stage 2: Secondary | ICD-10-CM | POA: Diagnosis not present

## 2020-11-15 DIAGNOSIS — L89622 Pressure ulcer of left heel, stage 2: Secondary | ICD-10-CM | POA: Diagnosis not present

## 2020-11-15 DIAGNOSIS — E1144 Type 2 diabetes mellitus with diabetic amyotrophy: Secondary | ICD-10-CM | POA: Diagnosis not present

## 2020-11-15 DIAGNOSIS — E1139 Type 2 diabetes mellitus with other diabetic ophthalmic complication: Secondary | ICD-10-CM | POA: Diagnosis not present

## 2020-11-15 DIAGNOSIS — L89152 Pressure ulcer of sacral region, stage 2: Secondary | ICD-10-CM | POA: Diagnosis not present

## 2020-11-15 DIAGNOSIS — E1165 Type 2 diabetes mellitus with hyperglycemia: Secondary | ICD-10-CM | POA: Diagnosis not present

## 2020-11-16 ENCOUNTER — Other Ambulatory Visit: Payer: Self-pay

## 2020-11-16 ENCOUNTER — Encounter: Payer: Medicare Other | Attending: Psychology | Admitting: Psychology

## 2020-11-16 DIAGNOSIS — G894 Chronic pain syndrome: Secondary | ICD-10-CM | POA: Diagnosis not present

## 2020-11-16 DIAGNOSIS — E1144 Type 2 diabetes mellitus with diabetic amyotrophy: Secondary | ICD-10-CM | POA: Insufficient documentation

## 2020-11-16 DIAGNOSIS — F329 Major depressive disorder, single episode, unspecified: Secondary | ICD-10-CM | POA: Insufficient documentation

## 2020-11-16 NOTE — Progress Notes (Signed)
Neuropsychological Consultation   Patient:                       Monica [name]            DOB:                           18-Jan-1951  MR Number:               [id number]  Location:                    [facility] [address] CENTER A [id number] [place] [address] Dept: [phone] Loc: [phone]                                                                                                              Date of Service:                     03/14/2020  Start Time:                             2 PM End Time:                               3 PM  Provider/Observer:               Arley Phenix, Psy.D.                                                   Clinical Neuropsychologist                                                   Billing Code/Service:            [phone]  Chief Complaint:                   Monica [name]  is a 70 year old female who has a past medical history including uncontrolled type 2 diabetes with polyneuropathy, cervicalgia, chronic headache, chronic pain on methadone/Nucynta, constipation, gait disorder with multiple falls.  The patient has had progressive weakness with inability to walk for extended period of time and was seen recently on the inpatient comprehensive rehabilitation unit.  I had also seen the patient for visits over the past 10 years on an outpatient basis and I saw the patient while she was on the inpatient basis.  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  Patient was started on steroids and IVIG for concerns of possible MS without improvement.  Patient also had muscle biopsies convexity with no significant finding.  Patient has continued to be limited by weakness with impairments in  mobility and ADLs.  Patient continues to need significant assistance from her husband and continues to primarily ambulate with wheelchair.  She reports today that she recently was able to go from sitting position to standing position 3 successive efforts and this was viewed as a positive improvement.  Patient continues to have issues related to depression and crying spells and continues to deal with severe headaches that are migrainous in nature as well as cervical facet syndrome.  The patient is also dealt with a lot of stress over the years and symptoms of depression and anxiety along with family stress.  There is a long history of excessive worrying as well as a history of sleep deprivation and insomnia.   Behavioral Observation:      Monica [name]   is a 70 year old right-handed female who appears older than her stated age.  Her dress was appropriate and she was well groomed with appropriate manners to the situation.  There were episodes today of drowsiness at times but she was able to remain oriented.  Mental status was appropriate she was oriented x4.  The patient's mood was somewhat positive and she had positive feelings about her limited functional gains over the past couple of months with therapy.  The patient was happy that she was in the process of finishing up how she and her husband have been building and are looking to move.  The patient was well-groomed with appropriate matters to the situation.  There were clear physical disabilities noted for motor strength and the patient remained in her wheelchair throughout and in fact I had to roll her back into my office due to strength limitations with the patient.  Interactions:                          Active Appropriate  Attention:                               abnormal and patient tends to be distracted by internal preoccupations and anxiety.  Memory:                                 within normal limits; recent and remote memory  intact  Visuo-spatial:                        not examined  Speech (Volume):                 normal  Speech:                                  normal; normal  Thought Process:                 Coherent and Relevant  Though Content:                   Rumination; not suicidal and not homicidal  Orientation:  person, place, time/date and situation  Judgment:                              Good  Planning:                                Good  Affect:                                     Anxious and Depressed  Mood:                                     Dysphoric  Insight:                                   Good  Intelligence:                           normal  Medical History:                         Past Medical History:  Diagnosis Date  . Arthritis   . Cervical facet joint syndrome 04/22/2012  . Cervicalgia   . Chronic headaches    Dr Hermelinda Medicus [place] [facility] Specialist  . Chronic nausea    normal GES   . Complication of anesthesia    had problem with local anesthesia-tried to assist Physician  . DM (diabetes mellitus) (HCC)   . Elevated liver enzymes    hx of, normal 03/2010  . GERD (gastroesophageal reflux disease) 05/31/2011   History Schatzki's ring, erosive reflux esophagitis, status post EGD and dilation 8/11 and 11/20  . Glaucoma    lens in both eyes  . Hemorrhoids 11/17/08   Colonoscopy Dr Karilyn Cota  . Occipital neuralgia   . PTSD (post-traumatic stress disorder)   . Renal lithiasis   . 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 Torticollis   . Unspecified musculoskeletal disorders and symptoms referable to neck    cervical/trapezius                                                    Abuse/Trauma History:        Patient has a significant history of chronic PTSD from events that happened years ago.  The patient also has a history of anxiety and depressive symptoms along with chronic pain and insomnia.  Family  Med/Psych History:        Family History  Problem Relation Age of Onset  . Colon cancer Mother 48  . Heart disease Father   . Diabetes Father   . Diabetes Paternal Grandmother   . Heart disease Paternal Grandfather   . Diverticulitis Daughter     Risk of Suicide/Violence:     low patient denies any suicidal homicidal ideation.  Impression/DX:                     Monica [name]  is a 70 year old female who has a past medical history including uncontrolled type 2 diabetes with polyneuropathy, cervicalgia, chronic headache, chronic pain on methadone/Nucynta, constipation, gait disorder with multiple falls.  The patient has had progressive weakness with inability to walk for extended period of time and was seen recently on the inpatient comprehensive rehabilitation unit.  I had also seen the patient for visits over the past 10 years on an outpatient basis and I saw the patient while she was on the inpatient basis.  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  Patient was started on steroids and IVIG for concerns of possible MS without improvement.  Patient also had muscle biopsies convexity with no significant finding.  Patient has continued to be limited by weakness with impairments in mobility and ADLs.  Patient continues to need significant assistance from her husband and continues to primarily ambulate with wheelchair.  She reports today that she recently was able to go from sitting position to standing position 3 successive efforts and this was viewed as a positive improvement.  Patient continues to have issues related to depression and crying spells and continues to deal with severe headaches that are migrainous in nature as well as cervical facet syndrome.  The patient is also dealt with a lot of  stress over the years and symptoms of depression and anxiety along with family stress.  There is a long history of excessive worrying as well as a history of sleep deprivation and insomnia.  Disposition/Plan:                   I will follow up with the patient in a couple of months and assess for ongoing changes.  The patient is doing better cognitively but continues to have significant weakness.                                         Electronically Signed   _______________________ Arley PhenixJohn Quiera Diffee, Psy.D.

## 2020-11-22 DIAGNOSIS — E1165 Type 2 diabetes mellitus with hyperglycemia: Secondary | ICD-10-CM | POA: Diagnosis not present

## 2020-11-22 DIAGNOSIS — L89152 Pressure ulcer of sacral region, stage 2: Secondary | ICD-10-CM | POA: Diagnosis not present

## 2020-11-22 DIAGNOSIS — E1144 Type 2 diabetes mellitus with diabetic amyotrophy: Secondary | ICD-10-CM | POA: Diagnosis not present

## 2020-11-22 DIAGNOSIS — E1139 Type 2 diabetes mellitus with other diabetic ophthalmic complication: Secondary | ICD-10-CM | POA: Diagnosis not present

## 2020-11-22 DIAGNOSIS — L89622 Pressure ulcer of left heel, stage 2: Secondary | ICD-10-CM | POA: Diagnosis not present

## 2020-11-22 DIAGNOSIS — L89612 Pressure ulcer of right heel, stage 2: Secondary | ICD-10-CM | POA: Diagnosis not present

## 2020-11-27 DIAGNOSIS — L89612 Pressure ulcer of right heel, stage 2: Secondary | ICD-10-CM | POA: Diagnosis not present

## 2020-11-27 DIAGNOSIS — L89152 Pressure ulcer of sacral region, stage 2: Secondary | ICD-10-CM | POA: Diagnosis not present

## 2020-11-27 DIAGNOSIS — E1139 Type 2 diabetes mellitus with other diabetic ophthalmic complication: Secondary | ICD-10-CM | POA: Diagnosis not present

## 2020-11-27 DIAGNOSIS — L89622 Pressure ulcer of left heel, stage 2: Secondary | ICD-10-CM | POA: Diagnosis not present

## 2020-11-27 DIAGNOSIS — E1165 Type 2 diabetes mellitus with hyperglycemia: Secondary | ICD-10-CM | POA: Diagnosis not present

## 2020-11-27 DIAGNOSIS — E1144 Type 2 diabetes mellitus with diabetic amyotrophy: Secondary | ICD-10-CM | POA: Diagnosis not present

## 2020-11-28 DIAGNOSIS — L89612 Pressure ulcer of right heel, stage 2: Secondary | ICD-10-CM | POA: Diagnosis not present

## 2020-11-28 DIAGNOSIS — L89622 Pressure ulcer of left heel, stage 2: Secondary | ICD-10-CM | POA: Diagnosis not present

## 2020-11-28 DIAGNOSIS — E1165 Type 2 diabetes mellitus with hyperglycemia: Secondary | ICD-10-CM | POA: Diagnosis not present

## 2020-11-28 DIAGNOSIS — L89152 Pressure ulcer of sacral region, stage 2: Secondary | ICD-10-CM | POA: Diagnosis not present

## 2020-11-28 DIAGNOSIS — E1139 Type 2 diabetes mellitus with other diabetic ophthalmic complication: Secondary | ICD-10-CM | POA: Diagnosis not present

## 2020-11-28 DIAGNOSIS — E1144 Type 2 diabetes mellitus with diabetic amyotrophy: Secondary | ICD-10-CM | POA: Diagnosis not present

## 2020-11-29 ENCOUNTER — Other Ambulatory Visit: Payer: Self-pay

## 2020-11-29 ENCOUNTER — Encounter: Payer: Self-pay | Admitting: Physical Medicine & Rehabilitation

## 2020-11-29 ENCOUNTER — Telehealth: Payer: Self-pay

## 2020-11-29 ENCOUNTER — Encounter
Payer: Worker's Compensation | Attending: Physical Medicine & Rehabilitation | Admitting: Physical Medicine & Rehabilitation

## 2020-11-29 VITALS — BP 109/66 | HR 69 | Temp 98.8°F | Ht 64.0 in

## 2020-11-29 DIAGNOSIS — H409 Unspecified glaucoma: Secondary | ICD-10-CM | POA: Diagnosis not present

## 2020-11-29 DIAGNOSIS — G4486 Cervicogenic headache: Secondary | ICD-10-CM | POA: Diagnosis present

## 2020-11-29 DIAGNOSIS — Z7984 Long term (current) use of oral hypoglycemic drugs: Secondary | ICD-10-CM | POA: Diagnosis not present

## 2020-11-29 DIAGNOSIS — M542 Cervicalgia: Secondary | ICD-10-CM | POA: Diagnosis not present

## 2020-11-29 DIAGNOSIS — M47812 Spondylosis without myelopathy or radiculopathy, cervical region: Secondary | ICD-10-CM | POA: Insufficient documentation

## 2020-11-29 DIAGNOSIS — G43711 Chronic migraine without aura, intractable, with status migrainosus: Secondary | ICD-10-CM | POA: Insufficient documentation

## 2020-11-29 DIAGNOSIS — L89622 Pressure ulcer of left heel, stage 2: Secondary | ICD-10-CM | POA: Diagnosis not present

## 2020-11-29 DIAGNOSIS — L89152 Pressure ulcer of sacral region, stage 2: Secondary | ICD-10-CM | POA: Diagnosis not present

## 2020-11-29 DIAGNOSIS — E1165 Type 2 diabetes mellitus with hyperglycemia: Secondary | ICD-10-CM | POA: Diagnosis not present

## 2020-11-29 DIAGNOSIS — E1139 Type 2 diabetes mellitus with other diabetic ophthalmic complication: Secondary | ICD-10-CM | POA: Diagnosis not present

## 2020-11-29 DIAGNOSIS — Z794 Long term (current) use of insulin: Secondary | ICD-10-CM | POA: Diagnosis not present

## 2020-11-29 DIAGNOSIS — E1144 Type 2 diabetes mellitus with diabetic amyotrophy: Secondary | ICD-10-CM | POA: Diagnosis not present

## 2020-11-29 DIAGNOSIS — L89612 Pressure ulcer of right heel, stage 2: Secondary | ICD-10-CM | POA: Diagnosis not present

## 2020-11-29 MED ORDER — METHADONE HCL 10 MG PO TABS: ORAL_TABLET | ORAL | 0 refills | Status: DC

## 2020-11-29 NOTE — Telephone Encounter (Signed)
Home health nurse is seeing patient twice weekly for wound care of right and left heels and an area on buttocks.  She has been seeing her for a while and would like to change product being used on patient.  On her right heel she has an area of eschar and she would like to start using MediHoney which she feels will remove this and allow her to see the wound bed.  On her left heel she would like to start Hydrofera.  On her buttocks she will continue alginate.  She just needs your approval and will send orders for you to sign.

## 2020-11-29 NOTE — Progress Notes (Signed)
Subjective:    Patient ID: Monica [name], female    DOB: 14-Jul-1951, 70 y.o.   MRN: [id number]  HPI   Nekesha is here in follow up of her chronic pain and mobility deficits. 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 She has been receiving IVIG monthly  She is doing some transfers on her own (toilet transfers). PT is coming out to the house once per week and will start twice a week soon.   Her pain levels have been generally better.  She has moments especially in the evening where her right foot and leg can be very painful with pain traveling approximately.  She has noticed with improvement in her leg pain and that her headaches can be more such severe again.  She is down to methadone 10 mg twice daily and 20 mg at bedtime currently.  She takes gabapentin 600 mg twice daily and 1200 mg at at bedtime.  She is using Nucynta for severe breakthrough headaches.       Pain Inventory Average Pain 6 Pain Right Now 6 My pain is constant and stabbing  In the last 24 hours, has pain interfered with the following? General activity 5 Relation with others 4 Enjoyment of life 5 What TIME of day is your pain at its worst? evening and night Sleep (in general) Poor  Pain is worse with: unsure Pain improves with: medication Relief from Meds: 7  Family History  Problem Relation Age of Onset  . Colon cancer Mother 34  . Heart disease Father   . Diabetes Father   . Diabetes Paternal Grandmother   . Heart disease Paternal Grandfather   . Diverticulitis Daughter    Social History   Socioeconomic History  . Marital status: Married    Spouse name: Not on file  . Number of children: 3  . Years of education: Not on file  . Highest education level: Not on file  Occupational History  . Occupation: disabled    Associate Professor: UNEMPLOYED  Tobacco Use  . Smoking status: Never Smoker  . Smokeless tobacco: Never Used  Vaping Use  . Vaping Use: Never used  Substance and Sexual Activity  .  Alcohol use: No  . Drug use: No  . Sexual activity: Yes    Birth control/protection: Post-menopausal  Other Topics Concern  . Not on file  Social History Narrative   Lives at home with husband    Right handed   Caffeine: none   Social Determinants of Health   Financial Resource Strain: Not on file  Food Insecurity: Not on file  Transportation Needs: Not on file  Physical Activity: Not on file  Stress: Not on file  Social Connections: Not on file   Past Surgical History:  Procedure Laterality Date  . BREAST LUMPECTOMY     left  . CATARACT EXTRACTION W/PHACO Left 10/07/2016   Procedure: CATARACT EXTRACTION PHACO AND INTRAOCULAR LENS PLACEMENT (IOC);  Surgeon: Gemma Payor, MD;  Location: [facility];  Service: Ophthalmology;  Laterality: Left;  CDE: 4.98  . CATARACT EXTRACTION W/PHACO Right 10/21/2016   Procedure: CATARACT EXTRACTION PHACO AND INTRAOCULAR LENS PLACEMENT RIGHT EYE CDE - 6.24;  Surgeon: Gemma Payor, MD;  Location: [facility];  Service: Ophthalmology;  Laterality: Right;  right  . CHOLECYSTECTOMY    . COLONOSCOPY  11/17/08   ext hemorrhoids  . CRANIOTOMY     secondary TBI  . CYST EXCISION     left wrist  . ESOPHAGOGASTRODUODENOSCOPY  04/05/10  [name] (62F),erosive reflux esophagitis, small hiatal hernia,  . ESOPHAGOGASTRODUODENOSCOPY (EGD) WITH PROPOFOL N/A 08/30/2019   Procedure: ESOPHAGOGASTRODUODENOSCOPY (EGD) WITH PROPOFOL;  Surgeon: Corbin Adeourk, Robert M, MD;  [facility];  Service: Endoscopy;  Laterality: N/A;  3:30pm - pt knows to arrive at 9:45  . EXTRACORPOREAL SHOCK WAVE LITHOTRIPSY Left 12/13/2019   Procedure: EXTRACORPOREAL SHOCK WAVE LITHOTRIPSY (ESWL);  Surgeon: Jerilee FieldEskridge, Matthew, MD;  [facility] [place];  Service: Urology;  Laterality: Left;  . FINGER SURGERY     removal cyst left pinky  . gastro empy study  03/03/10   normal  . HAND SURGERY    . MALONEY DILATION N/A 08/30/2019   Procedure: Elease HashimotoMALONEY DILATION;  Surgeon: Corbin Adeourk,  Robert M, MD;  [facility];  Service: Endoscopy;  Laterality: N/A;  . NECK SURGERY  1998  . NEPHROLITHOTOMY  05/13/2012   Procedure: NEPHROLITHOTOMY PERCUTANEOUS;  Surgeon: Marcine MatarStephen Dahlstedt, MD;  Location: [facility];  Service: Urology;  Laterality: Left;     . SURAL NERVE BX Right 02/28/2020   Procedure: SURAL NERVE / Quadricep BIOPSY;  Surgeon: Tia AlertJones, David S, MD;  [facility];  Service: Neurosurgery;  Laterality: Right;   Past Surgical History:  Procedure Laterality Date  . BREAST LUMPECTOMY     left  . CATARACT EXTRACTION W/PHACO Left 10/07/2016   Procedure: CATARACT EXTRACTION PHACO AND INTRAOCULAR LENS PLACEMENT (IOC);  Surgeon: Gemma PayorKerry Hunt, MD;  Location: [facility];  Service: Ophthalmology;  Laterality: Left;  CDE: 4.98  . CATARACT EXTRACTION W/PHACO Right 10/21/2016   Procedure: CATARACT EXTRACTION PHACO AND INTRAOCULAR LENS PLACEMENT RIGHT EYE CDE - 6.24;  Surgeon: Gemma PayorKerry Hunt, MD;  Location: [facility];  Service: Ophthalmology;  Laterality: Right;  right  . CHOLECYSTECTOMY    . COLONOSCOPY  11/17/08   ext hemorrhoids  . CRANIOTOMY     secondary TBI  . CYST EXCISION     left wrist  . ESOPHAGOGASTRODUODENOSCOPY  04/05/10   [name] (62F),erosive reflux esophagitis, small hiatal hernia,  . ESOPHAGOGASTRODUODENOSCOPY (EGD) WITH PROPOFOL N/A 08/30/2019   Procedure: ESOPHAGOGASTRODUODENOSCOPY (EGD) WITH PROPOFOL;  Surgeon: Corbin Adeourk, Robert M, MD;  [facility];  Service: Endoscopy;  Laterality: N/A;  3:30pm - pt knows to arrive at 9:45  . EXTRACORPOREAL SHOCK WAVE LITHOTRIPSY Left 12/13/2019   Procedure: EXTRACORPOREAL SHOCK WAVE LITHOTRIPSY (ESWL);  Surgeon: Jerilee FieldEskridge, Matthew, MD;  [facility] [place];  Service: Urology;  Laterality: Left;  . FINGER SURGERY     removal cyst left pinky  . gastro empy study  03/03/10   normal  . HAND SURGERY    . MALONEY DILATION N/A 08/30/2019   Procedure: Elease HashimotoMALONEY DILATION;  Surgeon: Corbin Adeourk, Robert M, MD;  [facility];  Service: Endoscopy;  Laterality: N/A;  . NECK SURGERY  1998  . NEPHROLITHOTOMY  05/13/2012   Procedure: NEPHROLITHOTOMY PERCUTANEOUS;  Surgeon: Marcine MatarStephen Dahlstedt, MD;  Location: [facility];  Service: Urology;  Laterality: Left;     . SURAL NERVE BX Right 02/28/2020   Procedure: SURAL NERVE / Quadricep BIOPSY;  Surgeon: Tia AlertJones, David S, MD;  [facility];  Service: Neurosurgery;  Laterality: Right;   Past Medical History:  Diagnosis Date  . Arthritis   . Cervical facet joint syndrome 04/22/2012  . Cervicalgia   . Chronic headaches    Dr Hermelinda MedicusSchwartz [place] [facility]ist  . Chronic nausea    normal GES   . Complication of anesthesia    had problem with local anesthesia-tried to  assist Physician  . DM (diabetes mellitus) (HCC)   . Elevated liver enzymes    hx of, normal 03/2010  . GERD (gastroesophageal reflux disease) 05/31/2011   History Schatzki's ring, erosive reflux esophagitis, status post EGD and dilation 8/11 and 11/20  . Glaucoma    lens in both eyes  . Hemorrhoids 11/17/08   Colonoscopy Dr Karilyn Cota  . Occipital neuralgia   . PTSD (post-traumatic stress disorder)   . Renal lithiasis   . 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 Torticollis   . Unspecified musculoskeletal disorders and symptoms referable to neck    cervical/trapezius   BP 109/66   Pulse 69   Temp 98.8 F (37.1 C)   Ht 5\' 4"  (1.626 m)   SpO2 98%   BMI 22.66 kg/m   Opioid Risk Score:   Fall Risk Score:  `1  Depression screen PHQ 2/9  Depression screen [facility] 2/9 08/02/2020 05/04/2020 04/04/2020 01/11/2020 10/29/2019 10/22/2019 07/16/2019  Decreased Interest 1 0 0 0 0 0 0  Down, Depressed, Hopeless 1 0 3 1 0 0 0  PHQ - 2 Score 2 0 3 1 0 0 0  Altered sleeping - - 3 - - - -  Tired, decreased energy - - 3 - - - -  Change in appetite - - 3 - - - -  Feeling bad or failure about yourself  - - 0 - - - -  Trouble concentrating - - 3 - - - -  Moving slowly or fidgety/restless - - 0 - - - -  Suicidal  thoughts - - 1 - - - -  PHQ-9 Score - - 16 - - - -  Difficult doing work/chores - - - - - - -  Some recent data might be hidden   Review of Systems  Musculoskeletal: Positive for arthralgias, back pain and gait problem.  Neurological: Positive for headaches.  All other systems reviewed and are negative.      Objective:   Physical Exam General: No acute distress HEENT: EOMI, oral membranes moist Cards: reg rate  Chest: normal effort Abdomen: Soft, NT, ND Skin: dry, intact Extremities: no edema Psych: pleasant and appropriate Neuro: slow to react. Is oriented to person, place. Upper extremity motor is 5 out of 5.  RLE 3+/5 prox with KE, HF, HAD/HAB to 0/trace distally. LLE 3/5 prox to trace-0/5 distally.   Reflexes remain absent. sensation appears intact in RUE but appears diminished LLE along radial aspect of hand as well as most of arm  Musculoskeletal: LE muscle wasting.  Still present          Assessment & Plan:  1.  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  Patient still with significant pain in both legs right more than left with associated motor and sensory loss.             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   2.  Chronic migraine headaches 3.  Chronic cervical spondylosis with facet arthropathy and associated cervicalgia and headaches 4.  Diabetes type II: Still inadequately controlled 5. Bilateral LE edema. Some improvement 6.  Constipation/ neurogenic bowel 7. Reactive depression?   Plan:  1  continue home health therapies.  I would like to progress to outpt therapies in the not to distant future. She would do well with aquatic therapy. 2.  Refilled her methadone 10mg  bid and 20mg  qhs. Plan to wean further over the next few  months.             -We will continue the controlled substance monitoring program, this consists of regular clinic visits, examinations, routine drug screening, pill counts as well as use of  [place] Controlled Substance Reporting System. [place].    Medication was refilled and a second prescription was sent to the patient's pharmacy for next month.    -Husband will call for follow-up prescription for methadone next month and we will prescribe based on her usage at that point.  Hopefully she will be down to the 10 mg 3 times daily. 3.  Continue Nucynta 75 mg every 12 hours as needed to better control neuropathic pain.  no Refill needed today 4.   Gabapentin to 600 mg bid and 1200mg  at bedtime 5.    Maintain nortriptyline   50 mg at bedtime 6.  Neurology [facility] treating for CIDP.   Continue monthly IVIG 7. Naproxen as needed only for severe pain given ARF 8.  Bowel program with bid miralax, hs senna-s and am suppository 9.  Mood: continue with counseling by Dr. [facility] for pain and stress mgt.     15 minutes of face to face patient care time were spent during this visit including counseling with husband. All questions were encouraged and answered. Follow up with me in 2 mos.

## 2020-11-29 NOTE — Patient Instructions (Addendum)
PLEASE FEEL FREE TO CALL OUR OFFICE WITH ANY PROBLEMS OR QUESTIONS [phone])  IN TWO WEEKS CUT THE NIGHT TIME METHADONE BY 1/2 TABLET AND TWO WEEKS AFTER THAT, DECREASE BY ONE FULL TABLET.  (10MG  THREE X DAILY)  CALL ME FOR REFILLS FOR THE NEXT MONTH IF THIS GOES WELL.

## 2020-11-29 NOTE — Telephone Encounter (Signed)
LMTCB

## 2020-11-29 NOTE — Telephone Encounter (Signed)
Can she please get [place] some pictures and measurements of the wounds?

## 2020-12-01 ENCOUNTER — Other Ambulatory Visit: Payer: Self-pay

## 2020-12-01 ENCOUNTER — Ambulatory Visit (INDEPENDENT_AMBULATORY_CARE_PROVIDER_SITE_OTHER): Payer: Medicare Other | Admitting: Family Medicine

## 2020-12-01 ENCOUNTER — Encounter: Payer: Self-pay | Admitting: Family Medicine

## 2020-12-01 VITALS — BP 99/61 | HR 72 | Temp 97.8°F

## 2020-12-01 DIAGNOSIS — L89152 Pressure ulcer of sacral region, stage 2: Secondary | ICD-10-CM

## 2020-12-01 DIAGNOSIS — F1120 Opioid dependence, uncomplicated: Secondary | ICD-10-CM

## 2020-12-01 DIAGNOSIS — F331 Major depressive disorder, recurrent, moderate: Secondary | ICD-10-CM

## 2020-12-01 DIAGNOSIS — L89622 Pressure ulcer of left heel, stage 2: Secondary | ICD-10-CM

## 2020-12-01 DIAGNOSIS — T402X5A Adverse effect of other opioids, initial encounter: Secondary | ICD-10-CM

## 2020-12-01 DIAGNOSIS — Z794 Long term (current) use of insulin: Secondary | ICD-10-CM

## 2020-12-01 DIAGNOSIS — E1142 Type 2 diabetes mellitus with diabetic polyneuropathy: Secondary | ICD-10-CM

## 2020-12-01 DIAGNOSIS — L89610 Pressure ulcer of right heel, unstageable: Secondary | ICD-10-CM

## 2020-12-01 DIAGNOSIS — G6181 Chronic inflammatory demyelinating polyneuritis: Secondary | ICD-10-CM

## 2020-12-01 DIAGNOSIS — Z5982 Transportation insecurity: Secondary | ICD-10-CM

## 2020-12-01 DIAGNOSIS — R234 Changes in skin texture: Secondary | ICD-10-CM

## 2020-12-01 DIAGNOSIS — Z9189 Other specified personal risk factors, not elsewhere classified: Secondary | ICD-10-CM

## 2020-12-01 DIAGNOSIS — K5903 Drug induced constipation: Secondary | ICD-10-CM

## 2020-12-01 DIAGNOSIS — G43711 Chronic migraine without aura, intractable, with status migrainosus: Secondary | ICD-10-CM

## 2020-12-01 LAB — BAYER DCA HB A1C WAIVED: HB A1C (BAYER DCA - WAIVED): 11.2 % — ABNORMAL HIGH (ref <7.0)

## 2020-12-01 MED ORDER — METFORMIN HCL ER 500 MG PO TB24: ORAL_TABLET | ORAL | 1 refills | Status: DC

## 2020-12-01 NOTE — Progress Notes (Signed)
Assessment & Plan:  1. Type 2 diabetes mellitus with diabetic polyneuropathy, with long-term current use of insulin (HCC) Lab Results  Component Value Date   HGBA1C 11.2 (H) 12/01/2020   HGBA1C 8.6 (H) 02/23/2020   HGBA1C 9.4 (H) 12/31/2019    - Diabetes is not at goal of A1c < 7.  - Medications: increase metformin from 2 tablets/day to 3 tablets/day. Increase Lantus by 1-2 units every week for fasting blood glucose readings consistently greater than 150. Medication changes are being made slowly due to patient developing lumbosacral plexitis versus CIDP after we decreased her A1c to quickly previously. Discussed with patient and husband that we can try to get her off the Lantus over time, but right now she is uncontrolled.  - Home glucose monitoring: continue monitoring. - Patient is currently taking a statin. Patient is not taking an ACE-inhibitor/ARB.  - Instruction/counseling given: reminded to get eye exam and discussed diet  Diabetes Health Maintenance Due  Topic Date Due  . OPHTHALMOLOGY EXAM  04/05/2020  . URINE MICROALBUMIN  06/28/2020  . FOOT EXAM  02/01/2021  . HEMOGLOBIN A1C  06/02/2021    Lab Results  Component Value Date   LABMICR 5.0 06/29/2019   - Lipid panel - CBC with Differential/Platelet - CMP14+EGFR - Bayer DCA Hb A1c Waived - Microalbumin / creatinine urine ratio (sterile urine cup given to patient to take home and collect) - metFORMIN (GLUCOPHAGE-XR) 500 MG 24 hr tablet; Take 2 tablets (1,000 mg total) by mouth daily with breakfast AND 1 tablet (500 mg total) every evening.  Dispense: 270 tablet; Refill: 1  2-3. Pressure injury of right heel, unstageable (HCC)/Eschar of heel Orders given for home health to start using Medihoney as requested. Continue to float heels. Daily dressing changes.  4. Pressure injury of left heel, stage 2 (HCC) Orders given for home health to start using Hydrofera as requested. Continue to float heels. Daily dressing changes or  more frequent for increased drainage to prevent maceration and wound enlargement.   5. Pressure injury of sacral region, stage 2 (HCC) Continue alginate.   6. Constipation due to opioid therapy Encouraged Miralax daily.  - CMP14+EGFR  7. CIDP (chronic inflammatory demyelinating polyneuropathy) (HCC) Managed by neurology. - CMP14+EGFR  8. Intractable chronic migraine without aura and with status migrainosus Managed by Dr. Caren Hazy.  9. Uncomplicated opioid dependence (HCC) Managed by Dr. Caren Hazy.  Who is trying to wean her down on methadone.  10. Moderate episode of recurrent major depressive disorder (HCC) Continue counseling with Dr. Kieth Brightly.  11. Lack of access to transportation Husband states he has to rent a van to bring her to doctor appointments. - AMB Referral to [facility]   Return in about 6 weeks (around 01/12/2021) for DM (telephone) and 3 months DM .  Deliah Boston, MSN, APRN, FNP-C [facility]  Subjective:    Patient ID: Monica [name], female    DOB: 1951-06-18, 70 y.o.   MRN: [id number]  Patient Care Team: [name], FNP as PCP - General (Family Medicine) Laqueta Linden, MD (Inactive) as PCP - Cardiology (Cardiology) Corbin Ade, MD (Gastroenterology) Ranae Pila, MD as Consulting Physician (Obstetrics and Gynecology) Ranelle Oyster, MD as Consulting Physician (Physical Medicine and Rehabilitation)   Chief Complaint:  Chief Complaint  Patient presents with  . Diabetes    Check up of chronic medical conditions     HPI: Monica [name] is a 70 y.o. female presenting on  12/01/2020 for Diabetes (Check up of chronic medical conditions )  Patient is accompanied by her husband who she is okay with being present.  He is her caregiver.  Diabetes: Patient presents for follow up of diabetes. Current symptoms include: hyperglycemia. Known diabetic complications: peripheral neuropathy and  lumbosacral plexitis versus CIDP.Marland Kitchen Medication compliance: She has been taking metformin 500 mg twice daily as prescribed but has only been using Lantus 25 units once daily.  Last year she was on Farxiga which was discontinued when she went into the hospital.  Additionally it was costing him around $200 per month.  Current diet: in general, a "healthy" diet  . Current exercise: none. Home blood sugar records: BGs are running  consistent with Hgb A1C. Is she  on ACE inhibitor or angiotensin II receptor blocker? No. Is she on a statin? Yes.   Constipation: Patient was previously using Dulcolax suppositories, oral Dulcolax, and magnesium for constipation.  She was prescribed Linzess, which the husband reports today she has not been taking because it causes $500 and causes her to have diarrhea.  He has been giving her MiraLAX almost daily and mineral oil if she has not had a bowel movement in a week, which immediately produces a bowel movement.  She has only required the mineral oil twice in the past 2 months.  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 Madie Reno, neurologist at [facility].  She has been doing physical therapy at home.  There has been some improvement as patient is able to toilet herself.  She is getting home infusions of IVIG every 4 weeks.  Pain: Managed by Dr. Naaman Plummer.  He plans to progress her to outpatient therapy, as he feels she will do well with aquatic therapy.  He is weaning her down on methadone.  She takes Nucynta as needed.  He has continued gabapentin and nortriptyline.  Mood: Patient is seeing Dr. Sima Matas, neuropsychologist.   New complaints: Patient has wounds on bilateral heels and her buttocks.  Her husband is changing her dressings daily.  He reports the wound on her buttocks is doing very well.  The home health nurse has asked for orders to use Medihoney on her right heel due to an area of eschar and Hydrofera for the left heel.  She is floating her heels and  wearing heel protecting boots.  They are using alginate on her buttocks.   Social history:  Relevant past medical, surgical, family and social history reviewed and updated as indicated. Interim medical history since our last visit reviewed.  Allergies and medications reviewed and updated.  DATA REVIEWED: CHART IN EPIC  ROS: Negative unless specifically indicated above in HPI.    Current Outpatient Medications:  .  acetaminophen (TYLENOL) 325 MG tablet, Take 1-2 tablets (325-650 mg total) by mouth every 4 (four) hours as needed for mild pain., Disp: , Rfl:  .  atorvastatin (LIPITOR) 10 MG tablet, TAKE 1 TABLET BY MOUTH ONCE DAILY., Disp: 90 tablet, Rfl: 1 .  gabapentin (NEURONTIN) 600 MG tablet, TAKE 1 TABLET 3 TIMES DAILY AND 2 TABLETS AT 8 PM DAILY., Disp: 150 tablet, Rfl: 2 .  IMMUNE GLOBULIN 10% 10 GM/100ML SOLN, Inject into the vein., Disp: , Rfl:  .  Insulin Glargine (LANTUS Bernice), 20 In unit 2 TIMES DAILY (route: subcutaneous), Disp: , Rfl:  .  Insulin Pen Needle (GLOBAL EASE INJECT PEN NEEDLES) 31G X 5 MM MISC, Use 1 pen needle every morning, noon, in the evening and at bedtime Dx E11.65,  Disp: 100 each, Rfl: 11 .  LANTUS SOLOSTAR 100 UNIT/ML Solostar Pen, INJECT 20 UNITS INTO THE SKIN TWICE DAILY., Disp: 15 mL, Rfl: 0 .  metFORMIN (GLUCOPHAGE-XR) 500 MG 24 hr tablet, Take 1 tablet (500 mg total) by mouth 2 (two) times daily with a meal., Disp: 180 tablet, Rfl: 1 .  methadone (DOLOPHINE) 10 MG tablet, TAKE 1 TABLET twice daily and 2 tablets at bedtime., Disp: 120 tablet, Rfl: 0 .  nortriptyline (PAMELOR) 50 MG capsule, TAKE 1 CAPSULE BY MOUTH AT BEDTIME., Disp: 30 capsule, Rfl: 2 .  Polyethylene Glycol 3350 (MIRALAX PO), 17 gram DAILY (route: oral), Disp: , Rfl:  .  tapentadol HCl (NUCYNTA) 75 MG tablet, Take 1 tablet (75 mg total) by mouth every 12 (twelve) hours as needed., Disp: 60 tablet, Rfl: 0 .  topiramate (TOPAMAX) 50 MG tablet, TAKE 2 TABLETS IN THE MORNING AND 4 TABLETS AT  BEDTIME., Disp: 180 tablet, Rfl: 4   Allergies  Allergen Reactions  . Aspirin Anaphylaxis and Hives    Slurred speech, blurred vision- also  . Hydromorphone Hcl Shortness Of Breath and Other (See Comments)    Severe shortness of breath  . Vimpat [Lacosamide] Other (See Comments)    Hallucinations, out of body experience, confusion   . Depakote [Divalproex Sodium] Other (See Comments)    Reaction not recalled by husband  . Milnacipran Hcl Other (See Comments)    Dizziness   . Pristiq [Desvenlafaxine Succinate Monohydrate] Other (See Comments)    Dizziness   . Pristiq [Desvenlafaxine]   . Capsaicin Rash and Other (See Comments)    Red rash  . Gabapentin Other (See Comments)    States leg pain worsened, pt d/c'd   Past Medical History:  Diagnosis Date  . Arthritis   . Cervical facet joint syndrome 04/22/2012  . Cervicalgia   . Chronic headaches    Dr Tessa Lerner [place] [facility]ist  . Chronic nausea    normal GES   . Complication of anesthesia    had problem with local anesthesia-tried to assist Physician  . DM (diabetes mellitus) ([place])   . Elevated liver enzymes    hx of, normal 03/2010  . GERD (gastroesophageal reflux disease) 05/31/2011   History Schatzki's ring, erosive reflux esophagitis, status post EGD and dilation 8/11 and 11/20  . Glaucoma    lens in both eyes  . Hemorrhoids 11/17/08   Colonoscopy Dr Laural Golden  . Occipital neuralgia   . PTSD (post-traumatic stress disorder)   . Renal lithiasis   . Seizure ([place])    12 yrs ago-med related  . Torticollis   . Unspecified musculoskeletal disorders and symptoms referable to neck    cervical/trapezius    Past Surgical History:  Procedure Laterality Date  . BREAST LUMPECTOMY     left  . CATARACT EXTRACTION W/PHACO Left 10/07/2016   Procedure: CATARACT EXTRACTION PHACO AND INTRAOCULAR LENS PLACEMENT (IOC);  Surgeon: Tonny Branch, MD;  Location: [facility];  Service: Ophthalmology;  Laterality: Left;  CDE: 4.98  . CATARACT  EXTRACTION W/PHACO Right 10/21/2016   Procedure: CATARACT EXTRACTION PHACO AND INTRAOCULAR LENS PLACEMENT RIGHT EYE CDE - 6.24;  Surgeon: Tonny Branch, MD;  Location: [facility];  Service: Ophthalmology;  Laterality: Right;  right  . CHOLECYSTECTOMY    . COLONOSCOPY  11/17/08   ext hemorrhoids  . CRANIOTOMY     secondary TBI  . CYST EXCISION     left wrist  . ESOPHAGOGASTRODUODENOSCOPY  04/05/10   [name] (32F),erosive reflux  esophagitis, small hiatal hernia,  . ESOPHAGOGASTRODUODENOSCOPY (EGD) WITH PROPOFOL N/A 08/30/2019   Procedure: ESOPHAGOGASTRODUODENOSCOPY (EGD) WITH PROPOFOL;  Surgeon: Daneil Dolin, MD;  [facility];  Service: Endoscopy;  Laterality: N/A;  3:30pm - pt knows to arrive at 9:45  . EXTRACORPOREAL SHOCK WAVE LITHOTRIPSY Left 12/13/2019   Procedure: EXTRACORPOREAL SHOCK WAVE LITHOTRIPSY (ESWL);  Surgeon: Festus Aloe, MD;  Location: [facility];  Service: Urology;  Laterality: Left;  . FINGER SURGERY     removal cyst left pinky  . gastro empy study  03/03/10   normal  . HAND SURGERY    . MALONEY DILATION N/A 08/30/2019   Procedure: Venia Minks DILATION;  Surgeon: Daneil Dolin, MD;  Location[facility];  Service: Endoscopy;  Laterality: N/A;  . NECK SURGERY  1998  . NEPHROLITHOTOMY  05/13/2012   Procedure: NEPHROLITHOTOMY PERCUTANEOUS;  Surgeon: Franchot Gallo, MD;  Location: [facility];  Service: Urology;  Laterality: Left;     . SURAL NERVE BX Right 02/28/2020   Procedure: SURAL NERVE / Quadricep BIOPSY;  Surgeon: Eustace Moore, MD;  Location: [facility];  Service: Neurosurgery;  Laterality: Right;    Social History   Socioeconomic History  . Marital status: Married    Spouse name: Not on file  . Number of children: 3  . Years of education: Not on file  . Highest education level: Not on file  Occupational History  . Occupation: disabled    Fish farm manager: UNEMPLOYED  Tobacco Use  . Smoking status: Never Smoker  . Smokeless tobacco:  Never Used  Vaping Use  . Vaping Use: Never used  Substance and Sexual Activity  . Alcohol use: No  . Drug use: No  . Sexual activity: Yes    Birth control/protection: Post-menopausal  Other Topics Concern  . Not on file  Social History Narrative   Lives at home with husband    Right handed   Caffeine: none   Social Determinants of Health   Financial Resource Strain: Not on file  Food Insecurity: Not on file  Transportation Needs: Not on file  Physical Activity: Not on file  Stress: Not on file  Social Connections: Not on file  Intimate Partner Violence: Not on file        Objective:    BP 99/61   Pulse 72   Temp 97.8 F (36.6 C) (Temporal)   SpO2 95%   Wt Readings from Last 3 Encounters:  04/19/20 132 lb (59.9 kg)  03/10/20 133 lb 13.1 oz (60.7 kg)  02/24/20 132 lb 4.4 oz (60 kg)    Physical Exam Vitals reviewed.  Constitutional:      General: She is not in acute distress.    Appearance: Normal appearance. She is not ill-appearing, toxic-appearing or diaphoretic.  HENT:     Head: Normocephalic and atraumatic.  Eyes:     General: No scleral icterus.       Right eye: No discharge.        Left eye: No discharge.     Conjunctiva/sclera: Conjunctivae normal.  Cardiovascular:     Rate and Rhythm: Normal rate and regular rhythm.     Heart sounds: Normal heart sounds. No murmur heard. No friction rub. No gallop.   Pulmonary:     Effort: Pulmonary effort is normal. No respiratory distress.     Breath sounds: Normal breath sounds. No stridor. No wheezing, rhonchi or rales.  Musculoskeletal:        General: Normal range  of motion.     Cervical back: Normal range of motion.  Skin:    General: Skin is warm and dry.     Capillary Refill: Capillary refill takes less than 2 seconds.     Comments: Right heel wound measures 2x3 cm. Left heel wound measures 2x0.7 cm. Maceration around left heel wound. Serous drainage on dressing.  No odor, warmth, or erythema to  either heel. Surround skin is blanchable bilaterally.  Discoloration across tops of toes is blanchable.   Neurological:     General: No focal deficit present.     Mental Status: She is alert and oriented to person, place, and time. Mental status is at baseline.     Gait: Gait abnormal (riding in [facility]; unable to ambulate).  Psychiatric:        Mood and Affect: Mood normal.        Behavior: Behavior normal.        Thought Content: Thought content normal.        Judgment: Judgment normal.             No results found for: TSH Lab Results  Component Value Date   WBC 6.8 04/04/2020   HGB 10.3 (L) 04/04/2020   HCT 32.6 (L) 04/04/2020   MCV 88 04/04/2020   PLT 233 04/04/2020   Lab Results  Component Value Date   NA 134 04/04/2020   K 4.5 04/04/2020   CO2 26 04/04/2020   GLUCOSE 194 (H) 04/04/2020   BUN 36 (H) 04/04/2020   CREATININE 0.75 04/04/2020   BILITOT <0.2 04/04/2020   ALKPHOS 202 (H) 04/04/2020   AST 10 04/04/2020   ALT 13 04/04/2020   PROT 6.7 04/04/2020   ALBUMIN 3.9 04/04/2020   CALCIUM 9.3 04/04/2020   ANIONGAP 9 03/20/2020   Lab Results  Component Value Date   CHOL 147 06/29/2019   Lab Results  Component Value Date   HDL 36 (L) 06/29/2019   Lab Results  Component Value Date   LDLCALC 81 06/29/2019   Lab Results  Component Value Date   TRIG 174 (H) 06/29/2019   Lab Results  Component Value Date   CHOLHDL 4.1 06/29/2019   Lab Results  Component Value Date   HGBA1C 8.6 (H) 02/23/2020

## 2020-12-01 NOTE — Patient Instructions (Addendum)
Increase metformin.  Increase Lantus by 1-2 units per week if the fasting blood sugar is staying above 150.  Schedule your eye exam.

## 2020-12-03 ENCOUNTER — Encounter: Payer: Self-pay | Admitting: Family Medicine

## 2020-12-03 DIAGNOSIS — L89152 Pressure ulcer of sacral region, stage 2: Secondary | ICD-10-CM | POA: Insufficient documentation

## 2020-12-03 DIAGNOSIS — L89622 Pressure ulcer of left heel, stage 2: Secondary | ICD-10-CM | POA: Insufficient documentation

## 2020-12-03 DIAGNOSIS — R234 Changes in skin texture: Secondary | ICD-10-CM | POA: Insufficient documentation

## 2020-12-04 ENCOUNTER — Telehealth: Payer: Self-pay | Admitting: *Deleted

## 2020-12-04 LAB — CBC WITH DIFFERENTIAL/PLATELET
Basophils Absolute: 0 10*3/uL (ref 0.0–0.2)
Basos: 1 %
EOS (ABSOLUTE): 0.1 10*3/uL (ref 0.0–0.4)
Eos: 2 %
Hematocrit: 35.6 % (ref 34.0–46.6)
Hemoglobin: 10.5 g/dL — ABNORMAL LOW (ref 11.1–15.9)
Immature Grans (Abs): 0 10*3/uL (ref 0.0–0.1)
Immature Granulocytes: 0 %
Lymphocytes Absolute: 1.3 10*3/uL (ref 0.7–3.1)
Lymphs: 18 %
MCH: 25.7 pg — ABNORMAL LOW (ref 26.6–33.0)
MCHC: 29.5 g/dL — ABNORMAL LOW (ref 31.5–35.7)
MCV: 87 fL (ref 79–97)
Monocytes Absolute: 0.4 10*3/uL (ref 0.1–0.9)
Monocytes: 5 %
Neutrophils Absolute: 5.2 10*3/uL (ref 1.4–7.0)
Neutrophils: 74 %
Platelets: 216 10*3/uL (ref 150–450)
RBC: 4.08 x10E6/uL (ref 3.77–5.28)
RDW: 15 % (ref 11.7–15.4)
WBC: 7 10*3/uL (ref 3.4–10.8)

## 2020-12-04 LAB — LIPID PANEL
Chol/HDL Ratio: 3.3 ratio (ref 0.0–4.4)
Cholesterol, Total: 119 mg/dL (ref 100–199)
HDL: 36 mg/dL — ABNORMAL LOW (ref >39)
LDL Chol Calc (NIH): 42 mg/dL (ref 0–99)
Triglycerides: 270 mg/dL — ABNORMAL HIGH (ref 0–149)
VLDL Cholesterol Cal: 41 mg/dL — ABNORMAL HIGH (ref 5–40)

## 2020-12-04 LAB — CMP14+EGFR
ALT: 14 IU/L (ref 0–32)
AST: 11 IU/L (ref 0–40)
Albumin/Globulin Ratio: 1.1 — ABNORMAL LOW (ref 1.2–2.2)
Albumin: 3.8 g/dL (ref 3.8–4.8)
Alkaline Phosphatase: 219 IU/L — ABNORMAL HIGH (ref 44–121)
BUN/Creatinine Ratio: 19 (ref 12–28)
BUN: 19 mg/dL (ref 8–27)
Bilirubin Total: <0.2 mg/dL (ref 0.0–1.2)
CO2: 21 mmol/L (ref 20–29)
Calcium: 9 mg/dL (ref 8.7–10.3)
Chloride: 94 mmol/L — ABNORMAL LOW (ref 96–106)
Creatinine, Ser: 0.98 mg/dL (ref 0.57–1.00)
Globulin, Total: 3.5 g/dL (ref 1.5–4.5)
Glucose: 510 mg/dL (ref 65–99)
Potassium: 5.1 mmol/L (ref 3.5–5.2)
Sodium: 133 mmol/L — ABNORMAL LOW (ref 134–144)
Total Protein: 7.3 g/dL (ref 6.0–8.5)
eGFR: 63 mL/min/{1.73_m2} (ref >59)

## 2020-12-04 NOTE — Telephone Encounter (Signed)
   Telephone encounter was:  Unsuccessful.  12/04/2020 Name: Monica [name] MRN: [id number] DOB: 08-30-1950  Unsuccessful outbound call made today to assist with:  Transportation Needs   Outreach Attempt:  1st Attempt   Instructed patient to call back at [phone]  at their earliest convenience.   Alois Cliche -[facility] Guide , Embedded Care Coordination [facility], Care Management  [phone] [address] [place] , [place] [address] Email : [email or web address]

## 2020-12-05 DIAGNOSIS — L89622 Pressure ulcer of left heel, stage 2: Secondary | ICD-10-CM | POA: Diagnosis not present

## 2020-12-05 DIAGNOSIS — E1165 Type 2 diabetes mellitus with hyperglycemia: Secondary | ICD-10-CM | POA: Diagnosis not present

## 2020-12-05 DIAGNOSIS — L89152 Pressure ulcer of sacral region, stage 2: Secondary | ICD-10-CM | POA: Diagnosis not present

## 2020-12-05 DIAGNOSIS — E1139 Type 2 diabetes mellitus with other diabetic ophthalmic complication: Secondary | ICD-10-CM | POA: Diagnosis not present

## 2020-12-05 DIAGNOSIS — E1144 Type 2 diabetes mellitus with diabetic amyotrophy: Secondary | ICD-10-CM | POA: Diagnosis not present

## 2020-12-05 DIAGNOSIS — L89612 Pressure ulcer of right heel, stage 2: Secondary | ICD-10-CM | POA: Diagnosis not present

## 2020-12-06 ENCOUNTER — Other Ambulatory Visit: Payer: Medicare Other

## 2020-12-06 DIAGNOSIS — L89612 Pressure ulcer of right heel, stage 2: Secondary | ICD-10-CM | POA: Diagnosis not present

## 2020-12-06 DIAGNOSIS — E1142 Type 2 diabetes mellitus with diabetic polyneuropathy: Secondary | ICD-10-CM | POA: Diagnosis not present

## 2020-12-06 DIAGNOSIS — E1144 Type 2 diabetes mellitus with diabetic amyotrophy: Secondary | ICD-10-CM | POA: Diagnosis not present

## 2020-12-06 DIAGNOSIS — L89152 Pressure ulcer of sacral region, stage 2: Secondary | ICD-10-CM | POA: Diagnosis not present

## 2020-12-06 DIAGNOSIS — E1139 Type 2 diabetes mellitus with other diabetic ophthalmic complication: Secondary | ICD-10-CM | POA: Diagnosis not present

## 2020-12-06 DIAGNOSIS — Z794 Long term (current) use of insulin: Secondary | ICD-10-CM | POA: Diagnosis not present

## 2020-12-06 DIAGNOSIS — E1165 Type 2 diabetes mellitus with hyperglycemia: Secondary | ICD-10-CM | POA: Diagnosis not present

## 2020-12-06 DIAGNOSIS — L89622 Pressure ulcer of left heel, stage 2: Secondary | ICD-10-CM | POA: Diagnosis not present

## 2020-12-06 NOTE — Telephone Encounter (Signed)
Patient seen for office visit by Deliah Boston on 12/01/2020.

## 2020-12-07 ENCOUNTER — Telehealth: Payer: Self-pay

## 2020-12-07 ENCOUNTER — Other Ambulatory Visit: Payer: Self-pay | Admitting: Family Medicine

## 2020-12-07 DIAGNOSIS — E1142 Type 2 diabetes mellitus with diabetic polyneuropathy: Secondary | ICD-10-CM

## 2020-12-07 DIAGNOSIS — N390 Urinary tract infection, site not specified: Secondary | ICD-10-CM

## 2020-12-07 LAB — MICROALBUMIN / CREATININE URINE RATIO
Creatinine, Urine: 34.9 mg/dL
Microalb/Creat Ratio: 20 mg/g creat (ref 0–29)
Microalbumin, Urine: 7.1 ug/mL

## 2020-12-07 NOTE — Telephone Encounter (Signed)
Husband states that he wanted patient re checked for UTI since she had a previous UTI.  Husband forgot to mention it yesterday to the urine was used as a micro.  Can he bring a urine sample since she was seen yesterday ? Wants to make sure UTI is cleared.

## 2020-12-07 NOTE — Telephone Encounter (Signed)
Husband aware he can bring urine by.

## 2020-12-07 NOTE — Telephone Encounter (Signed)
[facility] may still have urine sample since it was just brought back yesterday. If so, a UA can be added on.

## 2020-12-07 NOTE — Addendum Note (Signed)
Addended by: Tamera Punt on: 12/07/2020 03:13 PM   Modules accepted: Orders

## 2020-12-08 ENCOUNTER — Telehealth: Payer: Self-pay | Admitting: *Deleted

## 2020-12-08 ENCOUNTER — Other Ambulatory Visit: Payer: Self-pay

## 2020-12-08 ENCOUNTER — Ambulatory Visit (INDEPENDENT_AMBULATORY_CARE_PROVIDER_SITE_OTHER): Payer: Medicare Other

## 2020-12-08 DIAGNOSIS — Z794 Long term (current) use of insulin: Secondary | ICD-10-CM

## 2020-12-08 DIAGNOSIS — M542 Cervicalgia: Secondary | ICD-10-CM | POA: Diagnosis not present

## 2020-12-08 DIAGNOSIS — L89622 Pressure ulcer of left heel, stage 2: Secondary | ICD-10-CM

## 2020-12-08 DIAGNOSIS — E1165 Type 2 diabetes mellitus with hyperglycemia: Secondary | ICD-10-CM

## 2020-12-08 DIAGNOSIS — E1144 Type 2 diabetes mellitus with diabetic amyotrophy: Secondary | ICD-10-CM | POA: Diagnosis not present

## 2020-12-08 DIAGNOSIS — H409 Unspecified glaucoma: Secondary | ICD-10-CM | POA: Diagnosis not present

## 2020-12-08 DIAGNOSIS — L89152 Pressure ulcer of sacral region, stage 2: Secondary | ICD-10-CM | POA: Diagnosis not present

## 2020-12-08 DIAGNOSIS — E1139 Type 2 diabetes mellitus with other diabetic ophthalmic complication: Secondary | ICD-10-CM | POA: Diagnosis not present

## 2020-12-08 DIAGNOSIS — L89612 Pressure ulcer of right heel, stage 2: Secondary | ICD-10-CM | POA: Diagnosis not present

## 2020-12-08 DIAGNOSIS — Z7984 Long term (current) use of oral hypoglycemic drugs: Secondary | ICD-10-CM

## 2020-12-08 NOTE — Telephone Encounter (Signed)
   Telephone encounter was:  Unsuccessful.  12/08/2020 Name: Monica [name] MRN: [id number] DOB: 07/05/1951  Unsuccessful outbound call made today to assist with:  Transportation Needs   Outreach Attempt:  2nd Attempt  A HIPAA compliant voice message was left requesting a return call.  Instructed patient to call back at Told to call the careguide about transportaion at [phone]   [facility] -[facility] Guide , Embedded Care Coordination [facility], Care Management  [phone] [address] [place] , [place] [address] Email : [email or web address]

## 2020-12-09 DIAGNOSIS — E1165 Type 2 diabetes mellitus with hyperglycemia: Secondary | ICD-10-CM | POA: Diagnosis not present

## 2020-12-11 ENCOUNTER — Other Ambulatory Visit: Payer: Medicare Other

## 2020-12-11 DIAGNOSIS — N390 Urinary tract infection, site not specified: Secondary | ICD-10-CM | POA: Diagnosis not present

## 2020-12-11 LAB — MICROSCOPIC EXAMINATION

## 2020-12-11 LAB — URINALYSIS, COMPLETE
Bilirubin, UA: NEGATIVE
Glucose, UA: NEGATIVE
Ketones, UA: NEGATIVE
Nitrite, UA: NEGATIVE
Protein,UA: NEGATIVE
Specific Gravity, UA: 1.01 (ref 1.005–1.030)
Urobilinogen, Ur: 0.2 mg/dL (ref 0.2–1.0)
pH, UA: 6.5 (ref 5.0–7.5)

## 2020-12-12 ENCOUNTER — Telehealth: Payer: Self-pay | Admitting: *Deleted

## 2020-12-12 DIAGNOSIS — L89612 Pressure ulcer of right heel, stage 2: Secondary | ICD-10-CM | POA: Diagnosis not present

## 2020-12-12 DIAGNOSIS — E1144 Type 2 diabetes mellitus with diabetic amyotrophy: Secondary | ICD-10-CM | POA: Diagnosis not present

## 2020-12-12 DIAGNOSIS — L89622 Pressure ulcer of left heel, stage 2: Secondary | ICD-10-CM | POA: Diagnosis not present

## 2020-12-12 DIAGNOSIS — E1165 Type 2 diabetes mellitus with hyperglycemia: Secondary | ICD-10-CM | POA: Diagnosis not present

## 2020-12-12 DIAGNOSIS — E1139 Type 2 diabetes mellitus with other diabetic ophthalmic complication: Secondary | ICD-10-CM | POA: Diagnosis not present

## 2020-12-12 DIAGNOSIS — L89152 Pressure ulcer of sacral region, stage 2: Secondary | ICD-10-CM | POA: Diagnosis not present

## 2020-12-12 NOTE — Telephone Encounter (Signed)
   Telephone encounter was:  Successful.  12/12/2020 Name: FRANCIS YARDLEY MRN: [id number] DOB: 03/10/1951  [name] is a 70 y.o. year old female who is a primary care patient of [name], FNP . The community resource team was consulted for assistance with Transportation Needs   Care guide performed the following interventions: Patient refused to talk with careguide so on 3rd call  left ms. Guinyard ansered phone and said hwe had a bad connection I called her back and she stated she did not want to talk with me before I could tell her I was calling about transportaion information,called back left vm saying if she wished more information to call me at [phone]  Follow Up Plan:  No further follow up planned at this time. The patient has been provided with needed resources.  Alois Cliche -[facility] Guide , Embedded Care Coordination [facility], Care Management  [phone] [address] [place] , [place] [address] Email : [email or web address]

## 2020-12-14 DIAGNOSIS — E1144 Type 2 diabetes mellitus with diabetic amyotrophy: Secondary | ICD-10-CM | POA: Diagnosis not present

## 2020-12-14 DIAGNOSIS — E1139 Type 2 diabetes mellitus with other diabetic ophthalmic complication: Secondary | ICD-10-CM | POA: Diagnosis not present

## 2020-12-14 DIAGNOSIS — L89612 Pressure ulcer of right heel, stage 2: Secondary | ICD-10-CM | POA: Diagnosis not present

## 2020-12-14 DIAGNOSIS — L89622 Pressure ulcer of left heel, stage 2: Secondary | ICD-10-CM | POA: Diagnosis not present

## 2020-12-14 DIAGNOSIS — L89152 Pressure ulcer of sacral region, stage 2: Secondary | ICD-10-CM | POA: Diagnosis not present

## 2020-12-14 DIAGNOSIS — E1165 Type 2 diabetes mellitus with hyperglycemia: Secondary | ICD-10-CM | POA: Diagnosis not present

## 2020-12-14 LAB — URINE CULTURE

## 2020-12-18 DIAGNOSIS — E1144 Type 2 diabetes mellitus with diabetic amyotrophy: Secondary | ICD-10-CM | POA: Diagnosis not present

## 2020-12-18 DIAGNOSIS — L89152 Pressure ulcer of sacral region, stage 2: Secondary | ICD-10-CM | POA: Diagnosis not present

## 2020-12-18 DIAGNOSIS — L89622 Pressure ulcer of left heel, stage 2: Secondary | ICD-10-CM | POA: Diagnosis not present

## 2020-12-18 DIAGNOSIS — E1165 Type 2 diabetes mellitus with hyperglycemia: Secondary | ICD-10-CM | POA: Diagnosis not present

## 2020-12-18 DIAGNOSIS — L89612 Pressure ulcer of right heel, stage 2: Secondary | ICD-10-CM | POA: Diagnosis not present

## 2020-12-18 DIAGNOSIS — E1139 Type 2 diabetes mellitus with other diabetic ophthalmic complication: Secondary | ICD-10-CM | POA: Diagnosis not present

## 2020-12-19 DIAGNOSIS — L89152 Pressure ulcer of sacral region, stage 2: Secondary | ICD-10-CM | POA: Diagnosis not present

## 2020-12-19 DIAGNOSIS — L89622 Pressure ulcer of left heel, stage 2: Secondary | ICD-10-CM | POA: Diagnosis not present

## 2020-12-19 DIAGNOSIS — L89612 Pressure ulcer of right heel, stage 2: Secondary | ICD-10-CM | POA: Diagnosis not present

## 2020-12-19 DIAGNOSIS — E1165 Type 2 diabetes mellitus with hyperglycemia: Secondary | ICD-10-CM | POA: Diagnosis not present

## 2020-12-19 DIAGNOSIS — E1144 Type 2 diabetes mellitus with diabetic amyotrophy: Secondary | ICD-10-CM | POA: Diagnosis not present

## 2020-12-19 DIAGNOSIS — E1139 Type 2 diabetes mellitus with other diabetic ophthalmic complication: Secondary | ICD-10-CM | POA: Diagnosis not present

## 2020-12-21 DIAGNOSIS — L89612 Pressure ulcer of right heel, stage 2: Secondary | ICD-10-CM | POA: Diagnosis not present

## 2020-12-21 DIAGNOSIS — E1144 Type 2 diabetes mellitus with diabetic amyotrophy: Secondary | ICD-10-CM | POA: Diagnosis not present

## 2020-12-21 DIAGNOSIS — L89622 Pressure ulcer of left heel, stage 2: Secondary | ICD-10-CM | POA: Diagnosis not present

## 2020-12-21 DIAGNOSIS — E1139 Type 2 diabetes mellitus with other diabetic ophthalmic complication: Secondary | ICD-10-CM | POA: Diagnosis not present

## 2020-12-21 DIAGNOSIS — E1165 Type 2 diabetes mellitus with hyperglycemia: Secondary | ICD-10-CM | POA: Diagnosis not present

## 2020-12-21 DIAGNOSIS — L89152 Pressure ulcer of sacral region, stage 2: Secondary | ICD-10-CM | POA: Diagnosis not present

## 2020-12-25 DIAGNOSIS — L89622 Pressure ulcer of left heel, stage 2: Secondary | ICD-10-CM | POA: Diagnosis not present

## 2020-12-25 DIAGNOSIS — E1139 Type 2 diabetes mellitus with other diabetic ophthalmic complication: Secondary | ICD-10-CM | POA: Diagnosis not present

## 2020-12-25 DIAGNOSIS — E1165 Type 2 diabetes mellitus with hyperglycemia: Secondary | ICD-10-CM | POA: Diagnosis not present

## 2020-12-25 DIAGNOSIS — E1144 Type 2 diabetes mellitus with diabetic amyotrophy: Secondary | ICD-10-CM | POA: Diagnosis not present

## 2020-12-25 DIAGNOSIS — L89152 Pressure ulcer of sacral region, stage 2: Secondary | ICD-10-CM | POA: Diagnosis not present

## 2020-12-25 DIAGNOSIS — L89612 Pressure ulcer of right heel, stage 2: Secondary | ICD-10-CM | POA: Diagnosis not present

## 2020-12-28 DIAGNOSIS — E1139 Type 2 diabetes mellitus with other diabetic ophthalmic complication: Secondary | ICD-10-CM | POA: Diagnosis not present

## 2020-12-28 DIAGNOSIS — L89622 Pressure ulcer of left heel, stage 2: Secondary | ICD-10-CM | POA: Diagnosis not present

## 2020-12-28 DIAGNOSIS — L89152 Pressure ulcer of sacral region, stage 2: Secondary | ICD-10-CM | POA: Diagnosis not present

## 2020-12-28 DIAGNOSIS — L89612 Pressure ulcer of right heel, stage 2: Secondary | ICD-10-CM | POA: Diagnosis not present

## 2020-12-28 DIAGNOSIS — E1165 Type 2 diabetes mellitus with hyperglycemia: Secondary | ICD-10-CM | POA: Diagnosis not present

## 2020-12-28 DIAGNOSIS — E1144 Type 2 diabetes mellitus with diabetic amyotrophy: Secondary | ICD-10-CM | POA: Diagnosis not present

## 2020-12-29 DIAGNOSIS — L89152 Pressure ulcer of sacral region, stage 2: Secondary | ICD-10-CM | POA: Diagnosis not present

## 2020-12-29 DIAGNOSIS — E1144 Type 2 diabetes mellitus with diabetic amyotrophy: Secondary | ICD-10-CM | POA: Diagnosis not present

## 2020-12-29 DIAGNOSIS — L89612 Pressure ulcer of right heel, stage 2: Secondary | ICD-10-CM | POA: Diagnosis not present

## 2020-12-29 DIAGNOSIS — M542 Cervicalgia: Secondary | ICD-10-CM | POA: Diagnosis not present

## 2020-12-29 DIAGNOSIS — E1139 Type 2 diabetes mellitus with other diabetic ophthalmic complication: Secondary | ICD-10-CM | POA: Diagnosis not present

## 2020-12-29 DIAGNOSIS — E1165 Type 2 diabetes mellitus with hyperglycemia: Secondary | ICD-10-CM | POA: Diagnosis not present

## 2020-12-29 DIAGNOSIS — Z7984 Long term (current) use of oral hypoglycemic drugs: Secondary | ICD-10-CM | POA: Diagnosis not present

## 2020-12-29 DIAGNOSIS — L89622 Pressure ulcer of left heel, stage 2: Secondary | ICD-10-CM | POA: Diagnosis not present

## 2020-12-29 DIAGNOSIS — Z794 Long term (current) use of insulin: Secondary | ICD-10-CM | POA: Diagnosis not present

## 2020-12-29 DIAGNOSIS — H409 Unspecified glaucoma: Secondary | ICD-10-CM | POA: Diagnosis not present

## 2021-01-01 DIAGNOSIS — E1165 Type 2 diabetes mellitus with hyperglycemia: Secondary | ICD-10-CM | POA: Diagnosis not present

## 2021-01-01 DIAGNOSIS — L89622 Pressure ulcer of left heel, stage 2: Secondary | ICD-10-CM | POA: Diagnosis not present

## 2021-01-01 DIAGNOSIS — E1139 Type 2 diabetes mellitus with other diabetic ophthalmic complication: Secondary | ICD-10-CM | POA: Diagnosis not present

## 2021-01-01 DIAGNOSIS — E1144 Type 2 diabetes mellitus with diabetic amyotrophy: Secondary | ICD-10-CM | POA: Diagnosis not present

## 2021-01-01 DIAGNOSIS — L89152 Pressure ulcer of sacral region, stage 2: Secondary | ICD-10-CM | POA: Diagnosis not present

## 2021-01-01 DIAGNOSIS — L89612 Pressure ulcer of right heel, stage 2: Secondary | ICD-10-CM | POA: Diagnosis not present

## 2021-01-02 DIAGNOSIS — L89622 Pressure ulcer of left heel, stage 2: Secondary | ICD-10-CM | POA: Diagnosis not present

## 2021-01-02 DIAGNOSIS — L89612 Pressure ulcer of right heel, stage 2: Secondary | ICD-10-CM | POA: Diagnosis not present

## 2021-01-02 DIAGNOSIS — E1139 Type 2 diabetes mellitus with other diabetic ophthalmic complication: Secondary | ICD-10-CM | POA: Diagnosis not present

## 2021-01-02 DIAGNOSIS — L89152 Pressure ulcer of sacral region, stage 2: Secondary | ICD-10-CM | POA: Diagnosis not present

## 2021-01-02 DIAGNOSIS — E1165 Type 2 diabetes mellitus with hyperglycemia: Secondary | ICD-10-CM | POA: Diagnosis not present

## 2021-01-02 DIAGNOSIS — E1144 Type 2 diabetes mellitus with diabetic amyotrophy: Secondary | ICD-10-CM | POA: Diagnosis not present

## 2021-01-03 ENCOUNTER — Ambulatory Visit (INDEPENDENT_AMBULATORY_CARE_PROVIDER_SITE_OTHER): Payer: Medicare Other

## 2021-01-03 VITALS — Ht 64.0 in | Wt 150.0 lb

## 2021-01-03 DIAGNOSIS — Z0000 Encounter for general adult medical examination without abnormal findings: Secondary | ICD-10-CM

## 2021-01-03 NOTE — Progress Notes (Signed)
Subjective:   Monica [name] is a 70 y.o. female who presents for an Initial Medicare Annual Wellness Visit.  Virtual Visit via Telephone Note  I connected with  Monica [name] on 01/03/21 at  2:00 PM EDT by telephone and verified that I am speaking with the correct person using two identifiers.  Location: Patient: Home Provider: [facility] Persons participating in the virtual visit: patient, her husband/Nurse Health Advisor   I discussed the limitations, risks, security and privacy concerns of performing an evaluation and management service by telephone and the availability of in person appointments. The patient expressed understanding and agreed to proceed.  Interactive audio and video telecommunications were attempted between this nurse and patient, however failed, due to patient having technical difficulties OR patient did not have access to video capability.  We continued and completed visit with audio only.  Some vital signs may be absent or patient reported.   Doni Widmer E Ruffin Lada, LPN   Review of Systems     Cardiac Risk Factors include: advanced age (>80men, >106 women);diabetes mellitus;dyslipidemia;sedentary lifestyle (Chronic Inflammatory Demyelinating Polyradiculoneuropathy)     Objective:    Today's Vitals   01/03/21 1356 01/03/21 1357  Weight: 150 lb (68 kg)   Height: 5\' 4"  (1.626 m)   PainSc:  5    Body mass index is 25.75 kg/m.  Advanced Directives 03/03/2020 02/24/2020 12/13/2019 08/30/2019 12/16/2016 10/21/2016 10/15/2016  Does Patient Have a Medical Advance Directive? No No Yes No No No No  Would patient like information on creating a medical advance directive? No - Patient declined No - Patient declined - No - Patient declined - No - Patient declined No - Patient declined  Pre-existing out of facility DNR order (yellow form or pink MOST form) - - - - - - -    Current Medications (verified) Outpatient Encounter Medications as of 01/03/2021  Medication Sig  .  acetaminophen (TYLENOL) 325 MG tablet Take 1-2 tablets (325-650 mg total) by mouth every 4 (four) hours as needed for mild pain.  01/05/2021 atorvastatin (LIPITOR) 10 MG tablet TAKE 1 TABLET BY MOUTH ONCE DAILY.  Marland Kitchen gabapentin (NEURONTIN) 600 MG tablet TAKE 1 TABLET 3 TIMES DAILY AND 2 TABLETS AT 8 PM DAILY.  Marland Kitchen IMMUNE GLOBULIN 10% 10 GM/100ML SOLN Inject into the vein.  . Insulin Glargine (LANTUS [place]) Inject 25 Units into the skin daily.  . Insulin Pen Needle (GLOBAL EASE INJECT PEN NEEDLES) 31G X 5 MM MISC Use 1 pen needle every morning, noon, in the evening and at bedtime Dx E11.65  . LANTUS SOLOSTAR 100 UNIT/ML Solostar Pen INJECT 20 UNITS INTO THE SKIN TWICE DAILY.  . metFORMIN (GLUCOPHAGE-XR) 500 MG 24 hr tablet Take 2 tablets (1,000 mg total) by mouth daily with breakfast AND 1 tablet (500 mg total) every evening.  . methadone (DOLOPHINE) 10 MG tablet TAKE 1 TABLET twice daily and 2 tablets at bedtime.  . nortriptyline (PAMELOR) 50 MG capsule TAKE 1 CAPSULE BY MOUTH AT BEDTIME.  . Polyethylene Glycol 3350 (MIRALAX PO) 17 gram DAILY (route: oral)  . tapentadol HCl (NUCYNTA) 75 MG tablet Take 1 tablet (75 mg total) by mouth every 12 (twelve) hours as needed.  . topiramate (TOPAMAX) 50 MG tablet TAKE 2 TABLETS IN THE MORNING AND 4 TABLETS AT BEDTIME.   No facility-administered encounter medications on file as of 01/03/2021.    Allergies (verified) Aspirin, Hydromorphone hcl, Vimpat [lacosamide], Depakote [divalproex sodium], Milnacipran hcl, Pristiq [desvenlafaxine succinate monohydrate], Pristiq [desvenlafaxine], Capsaicin, and Gabapentin  History: Past Medical History:  Diagnosis Date  . Arthritis   . Cervical facet joint syndrome 04/22/2012  . Cervicalgia   . Chronic headaches    Dr Hermelinda MedicusSchwartz [place] [facility]  . Chronic nausea    normal GES   . Complication of anesthesia    had problem with local anesthesia-tried to assist Physician  . DM (diabetes mellitus) (HCC)   . Elevated liver  enzymes    hx of, normal 03/2010  . GERD (gastroesophageal reflux disease) 05/31/2011   History Schatzki's ring, erosive reflux esophagitis, status post EGD and dilation 8/11 and 11/20  . Glaucoma    lens in both eyes  . Hemorrhoids 11/17/08   Colonoscopy Dr Karilyn Cotaehman  . Occipital neuralgia   . PTSD (post-traumatic stress disorder)   . Renal lithiasis   . 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 Torticollis   . Unspecified musculoskeletal disorders and symptoms referable to neck    cervical/trapezius   Past Surgical History:  Procedure Laterality Date  . BREAST LUMPECTOMY     left  . CATARACT EXTRACTION W/PHACO Left 10/07/2016   Procedure: CATARACT EXTRACTION PHACO AND INTRAOCULAR LENS PLACEMENT (IOC);  Surgeon: Gemma PayorKerry Hunt, MD;  Location: [facility];  Service: Ophthalmology;  Laterality: Left;  CDE: 4.98  . CATARACT EXTRACTION W/PHACO Right 10/21/2016   Procedure: CATARACT EXTRACTION PHACO AND INTRAOCULAR LENS PLACEMENT RIGHT EYE CDE - 6.24;  Surgeon: Gemma PayorKerry Hunt, MD;  Location: [facility];  Service: Ophthalmology;  Laterality: Right;  right  . CHOLECYSTECTOMY    . COLONOSCOPY  11/17/08   ext hemorrhoids  . CRANIOTOMY     secondary TBI  . CYST EXCISION     left wrist  . ESOPHAGOGASTRODUODENOSCOPY  04/05/10   [name] (43F),erosive reflux esophagitis, small hiatal hernia,  . ESOPHAGOGASTRODUODENOSCOPY (EGD) WITH PROPOFOL N/A 08/30/2019   Procedure: ESOPHAGOGASTRODUODENOSCOPY (EGD) WITH PROPOFOL;  Surgeon: Corbin Adeourk, Robert M, MD;  Location: [facility];  Service: Endoscopy;  Laterality: N/A;  3:30pm - pt knows to arrive at 9:45  . EXTRACORPOREAL SHOCK WAVE LITHOTRIPSY Left 12/13/2019   Procedure: EXTRACORPOREAL SHOCK WAVE LITHOTRIPSY (ESWL);  Surgeon: Jerilee FieldEskridge, Matthew, MD;  [facility] [place];  Service: Urology;  Laterality: Left;  . FINGER SURGERY     removal cyst left pinky  . gastro empy study  03/03/10   normal  . HAND SURGERY    . MALONEY DILATION N/A 08/30/2019    Procedure: Elease HashimotoMALONEY DILATION;  Surgeon: Corbin Adeourk, Robert M, MD;  Location: [facility];  Service: Endoscopy;  Laterality: N/A;  . NECK SURGERY  1998  . NEPHROLITHOTOMY  05/13/2012   Procedure: NEPHROLITHOTOMY PERCUTANEOUS;  Surgeon: Marcine MatarStephen Dahlstedt, MD;  Location: [facility];  Service: Urology;  Laterality: Left;     . SURAL NERVE BX Right 02/28/2020   Procedure: SURAL NERVE / Quadricep BIOPSY;  Surgeon: Tia AlertJones, David S, MD;  [facility];  Service: Neurosurgery;  Laterality: Right;   Family History  Problem Relation Age of Onset  . Colon cancer Mother 2160  . Heart disease Father   . Diabetes Father   . Diabetes Paternal Grandmother   . Heart disease Paternal Grandfather   . Diverticulitis Daughter    Social History   Socioeconomic History  . Marital status: Married    Spouse name: Not on file  . Number of children: 3  . Years of education: Not on file  . Highest education level: Not on file  Occupational History  . Occupation: disabled  Employer: UNEMPLOYED  Tobacco Use  . Smoking status: Never Smoker  . Smokeless tobacco: Never Used  Vaping Use  . Vaping Use: Never used  Substance and Sexual Activity  . Alcohol use: No  . Drug use: No  . Sexual activity: Yes    Birth control/protection: Post-menopausal  Other Topics Concern  . Not on file  Social History Narrative   Lives at home with husband - who is full time caretaker - Shehas    Right handed   Caffeine: none   Social Determinants of Health   Financial Resource Strain: Low Risk   . Difficulty of Paying Living Expenses: Not hard at all  Food Insecurity: No Food Insecurity  . Worried About Programme researcher, broadcasting/film/video in the Last Year: Never true  . Ran Out of Food in the Last Year: Never true  Transportation Needs: No Transportation Needs  . Lack of Transportation (Medical): No  . Lack of Transportation (Non-Medical): No  Physical Activity: Insufficiently Active  . Days of Exercise per Week: 2 days  . Minutes of  Exercise per Session: 30 min  Stress: No Stress Concern Present  . Feeling of Stress : Only a little  Social Connections: Moderately Integrated  . Frequency of Communication with Friends and Family: Twice a week  . Frequency of Social Gatherings with Friends and Family: Twice a week  . Attends Religious Services: Never  . Active Member of Clubs or Organizations: Yes  . Attends Banker Meetings: 1 to 4 times per year  . Marital Status: Married    Tobacco Counseling Counseling given: Not Answered   Clinical Intake:  Pre-visit preparation completed: Yes  Pain : 0-10 Pain Score: 5  Pain Type: Chronic pain Pain Location: Generalized Pain Descriptors / Indicators: Sore,Aching Pain Onset: More than a month ago Pain Frequency: Intermittent     BMI - recorded: 25.75 Nutritional Status: BMI 25 -29 Overweight Nutritional Risks: None Diabetes: Yes CBG done?: No Did pt. bring in CBG monitor from home?: No  How often do you need to have someone help you when you read instructions, pamphlets, or other written materials from your doctor or pharmacy?: 1 - Never  Nutrition Risk Assessment:  Has the patient had any N/V/D within the last 2 months?  No  Does the patient have any non-healing wounds?  Yes  - currently nurse from [facility] is coming to change dressing twice per week Has the patient had any unintentional weight loss or weight gain?  Yes   Diabetes:  Is the patient diabetic?  Yes  If diabetic, was a CBG obtained today?  No  Did the patient bring in their glucometer from home?  No  How often do you monitor your CBG's? Once daily fasting - today 145 per patient husband.   Financial Strains and Diabetes Management:  Are you having any financial strains with the device, your supplies or your medication? No .  Does the patient want to be seen by Chronic Care Management for management of their diabetes?  No  Would the patient like to be referred to a Nutritionist  or for Diabetic Management?  No   Diabetic Exams:  Diabetic Eye Exam: Completed 03/31/2020.   Diabetic Foot Exam: Completed 02/02/2020. Pt has been advised about the importance in completing this exam. Pt is scheduled for diabetic foot exam on 03/06/2021.    Interpreter Needed?: No  Information entered by :: Yahsir Wickens, LPN   Activities of Daily Living In your present  state of health, do you have any difficulty performing the following activities: 01/03/2021 03/03/2020  Hearing? N N  Vision? N N  Difficulty concentrating or making decisions? Y N  Walking or climbing stairs? Y Y  Dressing or bathing? Y Y  Doing errands, shopping? Y Y  Comment - -  Preparing Food and eating ? Y -  Using the Toilet? N -  Comment when able, she can transfer herself from bed to commode -  In the past six months, have you accidently leaked urine? Y -  Do you have problems with loss of bowel control? Y -  Managing your Medications? Y -  Managing your Finances? Y -  Housekeeping or managing your Housekeeping? Y -  Some recent data might be hidden    Patient Care Team: [name], FNP as PCP - General (Family Medicine) Laqueta Linden, MD (Inactive) as PCP - Cardiology (Cardiology) Corbin Ade, MD (Gastroenterology) Ranae Pila, MD as Consulting Physician (Obstetrics and Gynecology) Ranelle Oyster, MD as Consulting Physician (Physical Medicine and Rehabilitation)  Indicate any recent Medical Services you may have received from other than [facility] providers in the past year (date may be approximate).     Assessment:   This is a routine wellness examination for Monica [name].  Hearing/Vision screen  Hearing Screening             Right ear:           Left ear:           Comments: C/o mild hearing loss - declines hearing aids  Vision Screening Comments: Annual visits with Dr Conley Rolls in [place] - up to date with eye exam  Dietary  issues and exercise activities discussed: Current Exercise Habits: Structured exercise class, Type of exercise: strength training/weights;stretching (home health physical therapy), Time (Minutes): 30, Frequency (Times/Week): 2, Weekly Exercise (Minutes/Week): 60, Intensity: Moderate, Exercise limited by: neurologic condition(s);Other - see comments (Chronic Inflammatory Demyelinating Polyradiculoneuropathy)  Goals Addressed            This Visit's Progress   . patient stated       Hopes to improve mobility - currently doing PT twice per week      Depression Screen PHQ 2/9 Scores 01/03/2021 12/01/2020 11/29/2020 08/02/2020 05/04/2020 04/04/2020 01/11/2020  PHQ - 2 Score 0 3 1  PHQ- 9 Score 9 11 - - - 16 -  Exception Documentation - - - - - - -    Fall Risk Fall Risk  01/03/2021 12/01/2020 11/29/2020 09/27/2020 08/02/2020  Falls in the past year? 0 0 0 0 0  Comment - - - - -  Number falls in past yr: 0 - 0 - 0  Comment - - - - -  Injury with Fall? 0 - 0 - 0  Comment - - - - -  Risk for fall due to : Impaired mobility;Medication side effect;Orthopedic patient;Other (Comment) - - - -  Risk for fall due to: Comment Chronic Inflammatory Demyelinating Polyradiculoneuropathy - - - -  Follow up Education provided;Falls prevention discussed - - - -  Comment - - - - -    FALL RISK PREVENTION PERTAINING TO THE HOME:  Any stairs in or around the home? No  If so, are there any without handrails? No  Home free of loose throw rugs in walkways, pet beds, electrical cords, etc? Yes  Adequate lighting in your home to reduce risk of falls? Yes   ASSISTIVE  DEVICES UTILIZED TO PREVENT FALLS:  Life alert? No  Use of a cane, walker or w/c? Yes  Grab bars in the bathroom? Yes  Shower chair or bench in shower? No  Elevated toilet seat or a handicapped toilet? Yes   TIMED UP AND GO:  Was the test performed? No . Telephonic visit.  Cognitive Function: Cognitive status assessed by direct  observation. Patient is unable to complete screening 6CIT or MMSE. She says her memory comes and goes - refused to do testing today.        Immunizations Immunization History  Administered Date(s) Administered  . Influenza,inj,Quad PF,6+ Mos 06/13/2020  . PFIZER(Purple Top)SARS-COV-2 Vaccination 09/24/2019, 10/16/2019  . Pneumococcal Conjugate-13 06/29/2019  . Tdap 06/29/2019  . Zoster 11/03/2013    TDAP status: Up to date  Flu Vaccine status: Up to date  Pneumococcal vaccine status: Due, Education has been provided regarding the importance of this vaccine. Advised may receive this vaccine at local pharmacy or Health Dept. Aware to provide a copy of the vaccination record if obtained from local pharmacy or Health Dept. Verbalized acceptance and understanding.  Covid-19 vaccine status: Information provided on how to obtain vaccines.   Qualifies for Shingles Vaccine? Yes   Zostavax completed Yes   Shingrix Completed?: No.    Education has been provided regarding the importance of this vaccine. Patient has been advised to call insurance company to determine out of pocket expense if they have not yet received this vaccine. Advised may also receive vaccine at local pharmacy or Health Dept. Verbalized acceptance and understanding.  Screening Tests Health Maintenance  Topic Date Due  . COVID-19 Vaccine (3 - Booster for Pfizer series) 03/17/2020  . OPHTHALMOLOGY EXAM  04/05/2020  . COLONOSCOPY (Pts 45-58yrs Insurance coverage will need to be confirmed)  02/01/2021 (Originally 11/18/2018)  . PNA vac Low Risk Adult (2 of 2 - PPSV23) 12/01/2021 (Originally 06/28/2020)  . FOOT EXAM  02/01/2021  . INFLUENZA VACCINE  03/12/2021  . HEMOGLOBIN A1C  06/02/2021  . URINE MICROALBUMIN  12/06/2021  . TETANUS/TDAP  06/28/2029  . DEXA SCAN  Completed  . Hepatitis C Screening  Completed  . HPV VACCINES  Aged Out  . MAMMOGRAM  Discontinued    Health Maintenance  Health Maintenance Due  Topic  Date Due  . COVID-19 Vaccine (3 - Booster for Pfizer series) 03/17/2020  . OPHTHALMOLOGY EXAM  04/05/2020    Colorectal cancer screening: No longer required.   Mammogram status: No longer required due to unable to stand.  Bone Density Screening: patient unable to get onto table  Lung Cancer Screening: (Low Dose CT Chest recommended if Age 9-80 years, 30 pack-year currently smoking OR have quit w/in 15years.) does not qualify.   Additional Screening:  Hepatitis C Screening: does qualify; Completed 03/01/2020  Vision Screening: Recommended annual ophthalmology exams for early detection of glaucoma and other disorders of the eye. Is the patient up to date with their annual eye exam?  Yes  Who is the provider or what is the name of the office in which the patient attends annual eye exams? Dr Conley Rolls If pt is not established with a provider, would they like to be referred to a provider to establish care? No .   Dental Screening: Recommended annual dental exams for proper oral hygiene  Community Resource Referral / Chronic Care Management: CRR required this visit?  No   CCM required this visit?  No      Plan:     I  have personally reviewed and noted the following in the patient's chart:   . Medical and social history . Use of alcohol, tobacco or illicit drugs  . Current medications and supplements including opioid prescriptions. Patient is currently taking opioid prescriptions. Information provided to patient regarding non-opioid alternatives. Patient advised to discuss non-opioid treatment plan with their provider. . Functional ability and status . Nutritional status . Physical activity . Advanced directives . List of other physicians . Hospitalizations, surgeries, and ER visits in previous 12 months . Vitals . Screenings to include cognitive, depression, and falls . Referrals and appointments  In addition, I have reviewed and discussed with patient certain preventive  protocols, quality metrics, and best practice recommendations. A written personalized care plan for preventive services as well as general preventive health recommendations were provided to patient.     [place] Constable, LPN   9/32/6712   Nurse Notes: None

## 2021-01-03 NOTE — Patient Instructions (Signed)
Monica [name] , Thank you for taking time to come for your Medicare Wellness Visit. I appreciate your ongoing commitment to your health goals. Please review the following plan we discussed and let me know if I can assist you in the future.   Screening recommendations/referrals: Colonoscopy: no longer required Mammogram: no longer required Bone Density: Done 2015 - Do at next visit if able Recommended yearly ophthalmology/optometry visit for glaucoma screening and checkup Recommended yearly dental visit for hygiene and checkup  Vaccinations: Influenza vaccine: Done 06/14/2019 - Repeat annually Pneumococcal vaccine: Prevnar Done 06/29/2019, due for pneumovax Tdap vaccine: Done 06/29/2019 - Repeat in 10 years Shingles vaccine: Zostavax done 11/03/2013; Shingrix discussed. Please contact your pharmacy for coverage information.    Covid-19: Done 09/24/2019 & 10/16/2019  Advanced directives: Please bring a copy of your health care power of attorney and living will to the office to be added to your chart at your convenience.  Conditions/risks identified: continue physical therapy exercises daily to try to improve mobility; continue fall prevention measures.  Next appointment: Follow up in one year for your annual wellness visit    Preventive Care 65 Years and Older, Female Preventive care refers to lifestyle choices and visits with your health care provider that can promote health and wellness. What does preventive care include?  A yearly physical exam. This is also called an annual well check.  Dental exams once or twice a year.  Routine eye exams. Ask your health care provider how often you should have your eyes checked.  Personal lifestyle choices, including:  Daily care of your teeth and gums.  Regular physical activity.  Eating a healthy diet.  Avoiding tobacco and drug use.  Limiting alcohol use.  Practicing safe sex.  Taking low-dose aspirin every day.  Taking vitamin and  mineral supplements as recommended by your health care provider. What happens during an annual well check? The services and screenings done by your health care provider during your annual well check will depend on your age, overall health, lifestyle risk factors, and family history of disease. Counseling  Your health care provider may ask you questions about your:  Alcohol use.  Tobacco use.  Drug use.  Emotional well-being.  Home and relationship well-being.  Sexual activity.  Eating habits.  History of falls.  Memory and ability to understand (cognition).  Work and work Astronomer.  Reproductive health. Screening  You may have the following tests or measurements:  Height, weight, and BMI.  Blood pressure.  Lipid and cholesterol levels. These may be checked every 5 years, or more frequently if you are over 60 years old.  Skin check.  Lung cancer screening. You may have this screening every year starting at age 58 if you have a 30-pack-year history of smoking and currently smoke or have quit within the past 15 years.  Fecal occult blood test (FOBT) of the stool. You may have this test every year starting at age 73.  Flexible sigmoidoscopy or colonoscopy. You may have a sigmoidoscopy every 5 years or a colonoscopy every 10 years starting at age 47.  Hepatitis C blood test.  Hepatitis B blood test.  Sexually transmitted disease (STD) testing.  Diabetes screening. This is done by checking your blood sugar (glucose) after you have not eaten for a while (fasting). You may have this done every 1-3 years.  Bone density scan. This is done to screen for osteoporosis. You may have this done starting at age 16.  Mammogram. This may be done  every 1-2 years. Talk to your health care provider about how often you should have regular mammograms. Talk with your health care provider about your test results, treatment options, and if necessary, the need for more tests. Vaccines   Your health care provider may recommend certain vaccines, such as:  Influenza vaccine. This is recommended every year.  Tetanus, diphtheria, and acellular pertussis (Tdap, Td) vaccine. You may need a Td booster every 10 years.  Zoster vaccine. You may need this after age 37.  Pneumococcal 13-valent conjugate (PCV13) vaccine. One dose is recommended after age 92.  Pneumococcal polysaccharide (PPSV23) vaccine. One dose is recommended after age 34. Talk to your health care provider about which screenings and vaccines you need and how often you need them. This information is not intended to replace advice given to you by your health care provider. Make sure you discuss any questions you have with your health care provider. Document Released: 08/25/2015 Document Revised: 04/17/2016 Document Reviewed: 05/30/2015 Elsevier Interactive Patient Education  2017 [employer] Prevention in the Home Falls can cause injuries. They can happen to people of all ages. There are many things you can do to make your home safe and to help prevent falls. What can I do on the outside of my home?  Regularly fix the edges of walkways and driveways and fix any cracks.  Remove anything that might make you trip as you walk through a door, such as a raised step or threshold.  Trim any bushes or trees on the path to your home.  Use bright outdoor lighting.  Clear any walking paths of anything that might make someone trip, such as rocks or tools.  Regularly check to see if handrails are loose or broken. Make sure that both sides of any steps have handrails.  Any raised decks and porches should have guardrails on the edges.  Have any leaves, snow, or ice cleared regularly.  Use sand or salt on walking paths during winter.  Clean up any spills in your garage right away. This includes oil or grease spills. What can I do in the bathroom?  Use night lights.  Install grab bars by the toilet and in the  tub and shower. Do not use towel bars as grab bars.  Use non-skid mats or decals in the tub or shower.  If you need to sit down in the shower, use a plastic, non-slip stool.  Keep the floor dry. Clean up any water that spills on the floor as soon as it happens.  Remove soap buildup in the tub or shower regularly.  Attach bath mats securely with double-sided non-slip rug tape.  Do not have throw rugs and other things on the floor that can make you trip. What can I do in the bedroom?  Use night lights.  Make sure that you have a light by your bed that is easy to reach.  Do not use any sheets or blankets that are too big for your bed. They should not hang down onto the floor.  Have a firm chair that has side arms. You can use this for support while you get dressed.  Do not have throw rugs and other things on the floor that can make you trip. What can I do in the kitchen?  Clean up any spills right away.  Avoid walking on wet floors.  Keep items that you use a lot in easy-to-reach places.  If you need to reach something above you, use a  strong step stool that has a grab bar.  Keep electrical cords out of the way.  Do not use floor polish or wax that makes floors slippery. If you must use wax, use non-skid floor wax.  Do not have throw rugs and other things on the floor that can make you trip. What can I do with my stairs?  Do not leave any items on the stairs.  Make sure that there are handrails on both sides of the stairs and use them. Fix handrails that are broken or loose. Make sure that handrails are as long as the stairways.  Check any carpeting to make sure that it is firmly attached to the stairs. Fix any carpet that is loose or worn.  Avoid having throw rugs at the top or bottom of the stairs. If you do have throw rugs, attach them to the floor with carpet tape.  Make sure that you have a light switch at the top of the stairs and the bottom of the stairs. If you  do not have them, ask someone to add them for you. What else can I do to help prevent falls?  Wear shoes that:  Do not have high heels.  Have rubber bottoms.  Are comfortable and fit you well.  Are closed at the toe. Do not wear sandals.  If you use a stepladder:  Make sure that it is fully opened. Do not climb a closed stepladder.  Make sure that both sides of the stepladder are locked into place.  Ask someone to hold it for you, if possible.  Clearly mark and make sure that you can see:  Any grab bars or handrails.  First and last steps.  Where the edge of each step is.  Use tools that help you move around (mobility aids) if they are needed. These include:  Canes.  Walkers.  Scooters.  Crutches.  Turn on the lights when you go into a dark area. Replace any light bulbs as soon as they burn out.  Set up your furniture so you have a clear path. Avoid moving your furniture around.  If any of your floors are uneven, fix them.  If there are any pets around you, be aware of where they are.  Review your medicines with your doctor. Some medicines can make you feel dizzy. This can increase your chance of falling. Ask your doctor what other things that you can do to help prevent falls. This information is not intended to replace advice given to you by your health care provider. Make sure you discuss any questions you have with your health care provider. Document Released: 05/25/2009 Document Revised: 01/04/2016 Document Reviewed: 09/02/2014 Elsevier Interactive Patient Education  2017 [employer].

## 2021-01-08 DIAGNOSIS — E1165 Type 2 diabetes mellitus with hyperglycemia: Secondary | ICD-10-CM | POA: Diagnosis not present

## 2021-01-08 DIAGNOSIS — L89152 Pressure ulcer of sacral region, stage 2: Secondary | ICD-10-CM | POA: Diagnosis not present

## 2021-01-08 DIAGNOSIS — L89612 Pressure ulcer of right heel, stage 2: Secondary | ICD-10-CM | POA: Diagnosis not present

## 2021-01-08 DIAGNOSIS — E1144 Type 2 diabetes mellitus with diabetic amyotrophy: Secondary | ICD-10-CM | POA: Diagnosis not present

## 2021-01-08 DIAGNOSIS — L89622 Pressure ulcer of left heel, stage 2: Secondary | ICD-10-CM | POA: Diagnosis not present

## 2021-01-08 DIAGNOSIS — E1139 Type 2 diabetes mellitus with other diabetic ophthalmic complication: Secondary | ICD-10-CM | POA: Diagnosis not present

## 2021-01-09 DIAGNOSIS — E1139 Type 2 diabetes mellitus with other diabetic ophthalmic complication: Secondary | ICD-10-CM | POA: Diagnosis not present

## 2021-01-09 DIAGNOSIS — L89152 Pressure ulcer of sacral region, stage 2: Secondary | ICD-10-CM | POA: Diagnosis not present

## 2021-01-09 DIAGNOSIS — L89622 Pressure ulcer of left heel, stage 2: Secondary | ICD-10-CM | POA: Diagnosis not present

## 2021-01-09 DIAGNOSIS — E1144 Type 2 diabetes mellitus with diabetic amyotrophy: Secondary | ICD-10-CM | POA: Diagnosis not present

## 2021-01-09 DIAGNOSIS — L89612 Pressure ulcer of right heel, stage 2: Secondary | ICD-10-CM | POA: Diagnosis not present

## 2021-01-09 DIAGNOSIS — E1165 Type 2 diabetes mellitus with hyperglycemia: Secondary | ICD-10-CM | POA: Diagnosis not present

## 2021-01-10 ENCOUNTER — Ambulatory Visit (INDEPENDENT_AMBULATORY_CARE_PROVIDER_SITE_OTHER): Payer: Medicare Other | Admitting: Family Medicine

## 2021-01-10 DIAGNOSIS — Z794 Long term (current) use of insulin: Secondary | ICD-10-CM | POA: Diagnosis not present

## 2021-01-10 DIAGNOSIS — E1142 Type 2 diabetes mellitus with diabetic polyneuropathy: Secondary | ICD-10-CM | POA: Diagnosis not present

## 2021-01-10 MED ORDER — DAPAGLIFLOZIN PROPANEDIOL 5 MG PO TABS: 5.0000 mg | ORAL_TABLET | Freq: Every day | ORAL | 2 refills | Status: DC

## 2021-01-10 MED ORDER — METFORMIN HCL ER 500 MG PO TB24: 1000.0000 mg | ORAL_TABLET | Freq: Two times a day (BID) | ORAL | 1 refills | Status: DC

## 2021-01-10 NOTE — Progress Notes (Signed)
Virtual Visit via Telephone Note  I connected with Monica [name] on 01/10/21 at 1:51 PM by telephone and verified that I am speaking with the correct person using two identifiers. Monica [name] is currently located at home and her husband is currently with her during this visit. The provider, [name], FNP is located in their office at time of visit.  I discussed the limitations, risks, security and privacy concerns of performing an evaluation and management service by telephone and the availability of in person appointments. I also discussed with the patient that there may be a patient responsible charge related to this service. The patient expressed understanding and agreed to proceed.  Subjective: PCP: [name], FNP  Chief Complaint  Patient presents with   Diabetes   Patient was advised six weeks ago to increase Lantus by 1-2 units every week for fasting blood glucose readings consistently greater than 150. At that time she was using 25 units once daily. Her husband, who gives her the insulin, reports today she is currently using Lantus 25 units once daily. She has been checking her blood sugar and reports readings 182-274.    ROS: Per HPI  Current Outpatient Medications:    acetaminophen (TYLENOL) 325 MG tablet, Take 1-2 tablets (325-650 mg total) by mouth every 4 (four) hours as needed for mild pain., Disp: , Rfl:    atorvastatin (LIPITOR) 10 MG tablet, TAKE 1 TABLET BY MOUTH ONCE DAILY., Disp: 90 tablet, Rfl: 1   gabapentin (NEURONTIN) 600 MG tablet, TAKE 1 TABLET 3 TIMES DAILY AND 2 TABLETS AT 8 PM DAILY., Disp: 150 tablet, Rfl: 2   IMMUNE GLOBULIN 10% 10 GM/100ML SOLN, Inject into the vein., Disp: , Rfl:    Insulin Glargine (LANTUS [place]), Inject 25 Units into the skin daily., Disp: , Rfl:    Insulin Pen Needle (GLOBAL EASE INJECT PEN NEEDLES) 31G X 5 MM MISC, Use 1 pen needle every morning, noon, in the evening and at bedtime Dx E11.65, Disp: 100 each, Rfl: 11    LANTUS SOLOSTAR 100 UNIT/ML Solostar Pen, INJECT 20 UNITS INTO THE SKIN TWICE DAILY., Disp: 15 mL, Rfl: 0   metFORMIN (GLUCOPHAGE-XR) 500 MG 24 hr tablet, Take 2 tablets (1,000 mg total) by mouth daily with breakfast AND 1 tablet (500 mg total) every evening., Disp: 270 tablet, Rfl: 1   methadone (DOLOPHINE) 10 MG tablet, TAKE 1 TABLET twice daily and 2 tablets at bedtime., Disp: 120 tablet, Rfl: 0   nortriptyline (PAMELOR) 50 MG capsule, TAKE 1 CAPSULE BY MOUTH AT BEDTIME., Disp: 30 capsule, Rfl: 2   Polyethylene Glycol 3350 (MIRALAX PO), 17 gram DAILY (route: oral), Disp: , Rfl:    tapentadol HCl (NUCYNTA) 75 MG tablet, Take 1 tablet (75 mg total) by mouth every 12 (twelve) hours as needed., Disp: 60 tablet, Rfl: 0   topiramate (TOPAMAX) 50 MG tablet, TAKE 2 TABLETS IN THE MORNING AND 4 TABLETS AT BEDTIME., Disp: 180 tablet, Rfl: 4  Allergies  Allergen Reactions   Aspirin Anaphylaxis and Hives    Slurred speech, blurred vision- also   Hydromorphone Hcl Shortness Of Breath and Other (See Comments)    Severe shortness of breath   Vimpat [Lacosamide] Other (See Comments)    Hallucinations, out of body experience, confusion    Depakote [Divalproex Sodium] Other (See Comments)    Reaction not recalled by husband   Milnacipran Hcl Other (See Comments)    Dizziness    Pristiq [Desvenlafaxine Succinate Monohydrate]  Other (See Comments)    Dizziness    Pristiq [Desvenlafaxine]    Capsaicin Rash and Other (See Comments)    Red rash   Gabapentin Other (See Comments)    States leg pain worsened, pt d/c'd   Past Medical History:  Diagnosis Date   Arthritis    Cervical facet joint syndrome 04/22/2012   Cervicalgia    Chronic headaches    Dr Hermelinda [facility] [place] [facility]   Chronic nausea    normal GES    Complication of anesthesia    had problem with local anesthesia-tried to assist Physician   DM (diabetes mellitus) (HCC)    Elevated liver enzymes    hx of, normal 03/2010   GERD  (gastroesophageal reflux disease) 05/31/2011   History Schatzki's ring, erosive reflux esophagitis, status post EGD and dilation 8/11 and 11/20   Glaucoma    lens in both eyes   Hemorrhoids 11/17/08   Colonoscopy Dr Karilyn Cota   Occipital neuralgia    PTSD (post-traumatic stress disorder)    Renal lithiasis    Seizure (HCC)    12 yrs ago-med related   Torticollis    Unspecified musculoskeletal disorders and symptoms referable to neck    cervical/trapezius    Observations/Objective: A&O  No respiratory distress or wheezing audible over the phone Mood, judgement, and thought processes all WNL   Assessment and Plan: 1. Type 2 diabetes mellitus with diabetic polyneuropathy, with long-term current use of insulin (HCC) Uncontrolled - increase metformin 2 tabs in the AM and 1 in the PM to 2 tabs twice daily. Also start Farxiga 5 mg daily.  - metFORMIN (GLUCOPHAGE-XR) 500 MG 24 hr tablet; Take 2 tablets (1,000 mg total) by mouth 2 (two) times daily with a meal.  Dispense: 360 tablet; Refill: 1 - dapagliflozin propanediol (FARXIGA) 5 MG TABS tablet; Take 1 tablet (5 mg total) by mouth daily before breakfast.  Dispense: 30 tablet; Refill: 2   Follow Up Instructions: Return in about 6 weeks (around 02/21/2021) for DM.  I discussed the assessment and treatment plan with the patient. The patient was provided an opportunity to ask questions and all were answered. The patient agreed with the plan and demonstrated an understanding of the instructions.   The patient was advised to call back or seek an in-person evaluation if the symptoms worsen or if the condition fails to improve as anticipated.  The above assessment and management plan was discussed with the patient. The patient verbalized understanding of and has agreed to the management plan. Patient is aware to call the clinic if symptoms persist or worsen. Patient is aware when to return to the clinic for a follow-up visit. Patient educated on when  it is appropriate to go to the emergency department.   Time call ended: 2:04 PM  I provided 13 minutes of non-face-to-face time during this encounter.  Deliah Boston, MSN, APRN, FNP-C [facility] 01/10/21

## 2021-01-11 ENCOUNTER — Other Ambulatory Visit: Payer: Self-pay | Admitting: Family Medicine

## 2021-01-11 ENCOUNTER — Other Ambulatory Visit: Payer: Self-pay | Admitting: Physical Medicine & Rehabilitation

## 2021-01-11 DIAGNOSIS — Z794 Long term (current) use of insulin: Secondary | ICD-10-CM

## 2021-01-11 DIAGNOSIS — E1139 Type 2 diabetes mellitus with other diabetic ophthalmic complication: Secondary | ICD-10-CM | POA: Diagnosis not present

## 2021-01-11 DIAGNOSIS — L89152 Pressure ulcer of sacral region, stage 2: Secondary | ICD-10-CM | POA: Diagnosis not present

## 2021-01-11 DIAGNOSIS — L89612 Pressure ulcer of right heel, stage 2: Secondary | ICD-10-CM | POA: Diagnosis not present

## 2021-01-11 DIAGNOSIS — E1144 Type 2 diabetes mellitus with diabetic amyotrophy: Secondary | ICD-10-CM | POA: Diagnosis not present

## 2021-01-11 DIAGNOSIS — L89622 Pressure ulcer of left heel, stage 2: Secondary | ICD-10-CM | POA: Diagnosis not present

## 2021-01-11 DIAGNOSIS — E1165 Type 2 diabetes mellitus with hyperglycemia: Secondary | ICD-10-CM | POA: Diagnosis not present

## 2021-01-11 DIAGNOSIS — E1142 Type 2 diabetes mellitus with diabetic polyneuropathy: Secondary | ICD-10-CM

## 2021-01-13 DIAGNOSIS — E1144 Type 2 diabetes mellitus with diabetic amyotrophy: Secondary | ICD-10-CM | POA: Diagnosis not present

## 2021-01-13 DIAGNOSIS — E1165 Type 2 diabetes mellitus with hyperglycemia: Secondary | ICD-10-CM | POA: Diagnosis not present

## 2021-01-13 DIAGNOSIS — L89612 Pressure ulcer of right heel, stage 2: Secondary | ICD-10-CM | POA: Diagnosis not present

## 2021-01-13 DIAGNOSIS — L89152 Pressure ulcer of sacral region, stage 2: Secondary | ICD-10-CM | POA: Diagnosis not present

## 2021-01-13 DIAGNOSIS — L89622 Pressure ulcer of left heel, stage 2: Secondary | ICD-10-CM | POA: Diagnosis not present

## 2021-01-13 DIAGNOSIS — E1139 Type 2 diabetes mellitus with other diabetic ophthalmic complication: Secondary | ICD-10-CM | POA: Diagnosis not present

## 2021-01-16 DIAGNOSIS — L89152 Pressure ulcer of sacral region, stage 2: Secondary | ICD-10-CM | POA: Diagnosis not present

## 2021-01-16 DIAGNOSIS — E1165 Type 2 diabetes mellitus with hyperglycemia: Secondary | ICD-10-CM | POA: Diagnosis not present

## 2021-01-16 DIAGNOSIS — L89612 Pressure ulcer of right heel, stage 2: Secondary | ICD-10-CM | POA: Diagnosis not present

## 2021-01-16 DIAGNOSIS — E1139 Type 2 diabetes mellitus with other diabetic ophthalmic complication: Secondary | ICD-10-CM | POA: Diagnosis not present

## 2021-01-16 DIAGNOSIS — L89622 Pressure ulcer of left heel, stage 2: Secondary | ICD-10-CM | POA: Diagnosis not present

## 2021-01-16 DIAGNOSIS — E1144 Type 2 diabetes mellitus with diabetic amyotrophy: Secondary | ICD-10-CM | POA: Diagnosis not present

## 2021-01-18 DIAGNOSIS — E1165 Type 2 diabetes mellitus with hyperglycemia: Secondary | ICD-10-CM | POA: Diagnosis not present

## 2021-01-18 DIAGNOSIS — E1144 Type 2 diabetes mellitus with diabetic amyotrophy: Secondary | ICD-10-CM | POA: Diagnosis not present

## 2021-01-18 DIAGNOSIS — L89622 Pressure ulcer of left heel, stage 2: Secondary | ICD-10-CM | POA: Diagnosis not present

## 2021-01-18 DIAGNOSIS — E1139 Type 2 diabetes mellitus with other diabetic ophthalmic complication: Secondary | ICD-10-CM | POA: Diagnosis not present

## 2021-01-18 DIAGNOSIS — L89152 Pressure ulcer of sacral region, stage 2: Secondary | ICD-10-CM | POA: Diagnosis not present

## 2021-01-18 DIAGNOSIS — L89612 Pressure ulcer of right heel, stage 2: Secondary | ICD-10-CM | POA: Diagnosis not present

## 2021-01-22 ENCOUNTER — Encounter: Payer: Self-pay | Admitting: Family Medicine

## 2021-01-22 DIAGNOSIS — E1139 Type 2 diabetes mellitus with other diabetic ophthalmic complication: Secondary | ICD-10-CM | POA: Diagnosis not present

## 2021-01-22 DIAGNOSIS — E1165 Type 2 diabetes mellitus with hyperglycemia: Secondary | ICD-10-CM | POA: Diagnosis not present

## 2021-01-22 DIAGNOSIS — L89612 Pressure ulcer of right heel, stage 2: Secondary | ICD-10-CM | POA: Diagnosis not present

## 2021-01-22 DIAGNOSIS — L89152 Pressure ulcer of sacral region, stage 2: Secondary | ICD-10-CM | POA: Diagnosis not present

## 2021-01-22 DIAGNOSIS — E1144 Type 2 diabetes mellitus with diabetic amyotrophy: Secondary | ICD-10-CM | POA: Diagnosis not present

## 2021-01-22 DIAGNOSIS — L89622 Pressure ulcer of left heel, stage 2: Secondary | ICD-10-CM | POA: Diagnosis not present

## 2021-01-23 DIAGNOSIS — L89152 Pressure ulcer of sacral region, stage 2: Secondary | ICD-10-CM | POA: Diagnosis not present

## 2021-01-23 DIAGNOSIS — L89622 Pressure ulcer of left heel, stage 2: Secondary | ICD-10-CM | POA: Diagnosis not present

## 2021-01-23 DIAGNOSIS — E1139 Type 2 diabetes mellitus with other diabetic ophthalmic complication: Secondary | ICD-10-CM | POA: Diagnosis not present

## 2021-01-23 DIAGNOSIS — E1165 Type 2 diabetes mellitus with hyperglycemia: Secondary | ICD-10-CM | POA: Diagnosis not present

## 2021-01-23 DIAGNOSIS — E1144 Type 2 diabetes mellitus with diabetic amyotrophy: Secondary | ICD-10-CM | POA: Diagnosis not present

## 2021-01-23 DIAGNOSIS — L89612 Pressure ulcer of right heel, stage 2: Secondary | ICD-10-CM | POA: Diagnosis not present

## 2021-01-24 ENCOUNTER — Encounter: Payer: Self-pay | Admitting: Physical Medicine & Rehabilitation

## 2021-01-24 ENCOUNTER — Telehealth: Payer: Self-pay | Admitting: Family Medicine

## 2021-01-24 ENCOUNTER — Encounter: Payer: Medicare Other | Attending: Psychology | Admitting: Physical Medicine & Rehabilitation

## 2021-01-24 ENCOUNTER — Other Ambulatory Visit: Payer: Self-pay

## 2021-01-24 VITALS — BP 112/69 | HR 66 | Temp 99.4°F

## 2021-01-24 DIAGNOSIS — G6181 Chronic inflammatory demyelinating polyneuritis: Secondary | ICD-10-CM | POA: Insufficient documentation

## 2021-01-24 DIAGNOSIS — G4486 Cervicogenic headache: Secondary | ICD-10-CM | POA: Insufficient documentation

## 2021-01-24 DIAGNOSIS — S91302A Unspecified open wound, left foot, initial encounter: Secondary | ICD-10-CM

## 2021-01-24 DIAGNOSIS — G43711 Chronic migraine without aura, intractable, with status migrainosus: Secondary | ICD-10-CM | POA: Insufficient documentation

## 2021-01-24 DIAGNOSIS — M47812 Spondylosis without myelopathy or radiculopathy, cervical region: Secondary | ICD-10-CM | POA: Insufficient documentation

## 2021-01-24 MED ORDER — METHADONE HCL 10 MG PO TABS: ORAL_TABLET | ORAL | 0 refills | Status: DC

## 2021-01-24 NOTE — Patient Instructions (Addendum)
PLEASE FEEL FREE TO CALL OUR OFFICE WITH ANY PROBLEMS OR QUESTIONS [phone]   METHADONE   STARTING 01/30/21 REDUCE TO:  05-16-09  ON 02/22/21,  IF ALL IS WELL REDUCE TO: 12-14-08

## 2021-01-24 NOTE — Telephone Encounter (Signed)
Please call patient and see if she is interested in a referral to wound care. It appears from the reports we receive from home health that the wound on her left heel is getting worse.

## 2021-01-24 NOTE — Progress Notes (Signed)
Subjective:    Patient ID: Monica [name], female    DOB: 03-12-1951, 70 y.o.   MRN: [id number]  HPI Monica [name] is here in follow up of her chronic pain and functional deficits. We last met 2 mos ago. Therapy is coming to the house and now is walking with [place] with her walker at home for short distances. She's walking without her AFO's currently due her heel wound and some other considerations. She is able to toilet and perform hygiene on her own.  Her weakness primarily is in her ankles as well as some associated sensory loss in this area.  He has dysesthesias in her feet as well.  She and her husband have been able to wean down her methadone to 10 mg 3 times daily.  He has noticed that she has been more alert and in a better mood in general.  She remains on gabapentin 600 mg twice daily and 1200 mg at nighttime along with amitriptyline.  She continues IVIG through [facility]  Pain Inventory Average Pain 7 Pain Right Now 7 My pain is constant, sharp, burning, and stabbing  In the last 24 hours, has pain interfered with the following? General activity  . Relation with others  . Enjoyment of life  . What TIME of day is your pain at its worst? varies Sleep (in general) Poor  Pain is worse with: bending and some activites Pain improves with: rest, heat/ice, and medication Relief from Meds: 8  Family History  Problem Relation Age of Onset   Colon cancer Mother 17   Heart disease Father    Diabetes Father    Diabetes Paternal Grandmother    Heart disease Paternal Grandfather    Diverticulitis Daughter    Social History   Socioeconomic History   Marital status: Married    Spouse name: Not on file   Number of children: 3   Years of education: Not on file   Highest education level: Not on file  Occupational History   Occupation: disabled    Employer: UNEMPLOYED  Tobacco Use   Smoking status: Never   Smokeless tobacco: Never  Vaping Use   Vaping Use:  Never used  Substance and Sexual Activity   Alcohol use: No   Drug use: No   Sexual activity: Yes    Birth control/protection: Post-menopausal  Other Topics Concern   Not on file  Social History Narrative   Lives at home with husband - who is full time caretaker - She has Chronic Inflammatory Demyelinating Polyradiculoneuropathy per her husband - Making her mobility very limited   Right handed   Caffeine: none   Social Determinants of Radio broadcast assistant Strain: Low Risk    Difficulty of Paying Living Expenses: Not hard at all  Food Insecurity: No Food Insecurity   Worried About Charity fundraiser in the Last Year: Never true   [place] in the Last Year: Never true  Transportation Needs: No Transportation Needs   Lack of Transportation (Medical): No   Lack of Transportation (Non-Medical): No  Physical Activity: Insufficiently Active   Days of Exercise per Week: 2 days   Minutes of Exercise per Session: 30 min  Stress: No Stress Concern Present   Feeling of Stress : Only a little  Social Connections: Moderately Integrated   Frequency of Communication with Friends and Family: Twice a week   Frequency of Social Gatherings with Friends and Family: Twice a week  Attends Religious Services: Never   Active Member of Clubs or Organizations: Yes   Attends Archivist Meetings: 1 to 4 times per year   Marital Status: Married   Past Surgical History:  Procedure Laterality Date   BREAST LUMPECTOMY     left   CATARACT EXTRACTION W/PHACO Left 10/07/2016   Procedure: CATARACT EXTRACTION PHACO AND INTRAOCULAR LENS PLACEMENT ([place]);  Surgeon: Tonny Branch, MD;  Location: [facility];  Service: Ophthalmology;  Laterality: Left;  CDE: 4.98   CATARACT EXTRACTION W/PHACO Right 10/21/2016   Procedure: CATARACT EXTRACTION PHACO AND INTRAOCULAR LENS PLACEMENT RIGHT EYE CDE - 6.24;  Surgeon: Tonny Branch, MD;  Location: [facility];  Service: Ophthalmology;  Laterality: Right;  right    CHOLECYSTECTOMY     COLONOSCOPY  11/17/08   ext hemorrhoids   CRANIOTOMY     secondary TBI   CYST EXCISION     left wrist   ESOPHAGOGASTRODUODENOSCOPY  04/05/10   [name] (79F),erosive reflux esophagitis, small hiatal hernia,   ESOPHAGOGASTRODUODENOSCOPY (EGD) WITH PROPOFOL N/A 08/30/2019   Procedure: ESOPHAGOGASTRODUODENOSCOPY (EGD) WITH PROPOFOL;  Surgeon: Daneil Dolin, MD;  [facility];  Service: Endoscopy;  Laterality: N/A;  3:30pm - pt knows to arrive at 9:45   [place] LITHOTRIPSY Left 12/13/2019   Procedure: EXTRACORPOREAL SHOCK WAVE LITHOTRIPSY (ESWL);  Surgeon: Festus Aloe, MD;  [facility];  Service: Urology;  Laterality: Left;   FINGER SURGERY     removal cyst left pinky   gastro empy study  03/03/10   normal   HAND SURGERY     MALONEY DILATION N/A 08/30/2019   Procedure: Venia Minks DILATION;  Surgeon: Daneil Dolin, MD;  [facility];  Service: Endoscopy;  Laterality: N/A;   NECK SURGERY  1998   NEPHROLITHOTOMY  05/13/2012   Procedure: NEPHROLITHOTOMY PERCUTANEOUS;  Surgeon: Franchot Gallo, MD;  Location: [facility];  Service: Urology;  Laterality: Left;      SURAL NERVE BX Right 02/28/2020   Procedure: SURAL NERVE / Quadricep BIOPSY;  Surgeon: Eustace Moore, MD;  Location: [facility];  Service: Neurosurgery;  Laterality: Right;   Past Surgical History:  Procedure Laterality Date   BREAST LUMPECTOMY     left   CATARACT EXTRACTION W/PHACO Left 10/07/2016   Procedure: CATARACT EXTRACTION PHACO AND INTRAOCULAR LENS PLACEMENT ([facility]);  Surgeon: Tonny Branch, MD;  Location: [facility];  Service: Ophthalmology;  Laterality: Left;  CDE: 4.98   CATARACT EXTRACTION W/PHACO Right 10/21/2016   Procedure: CATARACT EXTRACTION PHACO AND INTRAOCULAR LENS PLACEMENT RIGHT EYE CDE - 6.24;  Surgeon: Tonny Branch, MD;  Location: [facility];  Service: Ophthalmology;  Laterality: Right;  right   CHOLECYSTECTOMY     COLONOSCOPY  11/17/08    ext hemorrhoids   CRANIOTOMY     secondary TBI   CYST EXCISION     left wrist   ESOPHAGOGASTRODUODENOSCOPY  04/05/10   [name] (79F),erosive reflux esophagitis, small hiatal hernia,   ESOPHAGOGASTRODUODENOSCOPY (EGD) WITH PROPOFOL N/A 08/30/2019   Procedure: ESOPHAGOGASTRODUODENOSCOPY (EGD) WITH PROPOFOL;  Surgeon: Daneil Dolin, MD;  [facility];  Service: Endoscopy;  Laterality: N/A;  3:30pm - pt knows to arrive at 9:45   [place] LITHOTRIPSY Left 12/13/2019   Procedure: EXTRACORPOREAL SHOCK WAVE LITHOTRIPSY (ESWL);  Surgeon: Festus Aloe, MD;  Location[facility];  Service: Urology;  Laterality: Left;   FINGER SURGERY     removal cyst left pinky   gastro empy  study  03/03/10   normal   HAND SURGERY     MALONEY DILATION N/A 08/30/2019   Procedure: Venia Minks DILATION;  Surgeon: Daneil Dolin, MD;  [facility];  Service: Endoscopy;  Laterality: N/A;   NECK SURGERY  1998   NEPHROLITHOTOMY  05/13/2012   Procedure: NEPHROLITHOTOMY PERCUTANEOUS;  Surgeon: Franchot Gallo, MD;  [facility];  Service: Urology;  Laterality: Left;      SURAL NERVE BX Right 02/28/2020   Procedure: SURAL NERVE / Quadricep BIOPSY;  Surgeon: Eustace Moore, MD;  Location: [facility];  Service: Neurosurgery;  Laterality: Right;   Past Medical History:  Diagnosis Date   Arthritis    Cervical facet joint syndrome 04/22/2012   Cervicalgia    Chronic headaches    Dr Tessa Lerner [facility]   Chronic nausea    normal GES    Complication of anesthesia    had problem with local anesthesia-tried to assist Physician   DM (diabetes mellitus) ([place])    Elevated liver enzymes    hx of, normal 03/2010   GERD (gastroesophageal reflux disease) 05/31/2011   History Schatzki's ring, erosive reflux esophagitis, status post EGD and dilation 8/11 and 11/20   Glaucoma    lens in both eyes   Hemorrhoids 11/17/08   Colonoscopy Dr Laural Golden   Occipital  neuralgia    PTSD (post-traumatic stress disorder)    Renal lithiasis    Seizure ([place])    12 yrs ago-med related   Torticollis    Unspecified musculoskeletal disorders and symptoms referable to neck    cervical/trapezius   BP 112/69   Pulse 66   Temp 99.4 F (37.4 C)   SpO2 95%   Opioid Risk Score:   Fall Risk Score:  `1  Depression screen PHQ 2/9  Depression screen [facility] 2/9 01/03/2021 12/01/2020 11/29/2020 08/02/2020 05/04/2020 04/04/2020 01/11/2020  Decreased Interest _0 0 0 0  Down, Depressed, Hopeless _1 0 3 1  PHQ - 2 Score _2 0 3 1  Altered sleeping 1 2 - - - 3 -  Tired, decreased energy 1 1 - - - 3 -  Change in appetite 1 1 - - - 3 -  Feeling bad or failure about yourself  0 1 - - - 0 -  Trouble concentrating 2 2 - - - 3 -  Moving slowly or fidgety/restless 2 2 - - - 0 -  Suicidal thoughts 0 0 - - - 1 -  PHQ-9 Score 9 11 - - - 16 -  Difficult doing work/chores Somewhat difficult Somewhat difficult - - - - -  Some recent data might be hidden    Review of Systems  Constitutional: Negative.   HENT: Negative.    Eyes: Negative.   Respiratory: Negative.    Cardiovascular: Negative.   Gastrointestinal: Negative.   Endocrine: Negative.   Genitourinary: Negative.   Musculoskeletal:  Positive for arthralgias, back pain, gait problem, myalgias and neck pain.  Skin: Negative.   Allergic/Immunologic: Negative.   Neurological:  Positive for headaches.  Hematological: Negative.   Psychiatric/Behavioral: Negative.    All other systems reviewed and are negative.     Objective:   Physical Exam General: No acute distress HEENT: EOMI, oral membranes moist Cards: reg rate  Chest: normal effort Abdomen: Soft, NT, ND Skin: dry, intact Extremities: no edema Psych: pleasant and appropriate  Neuro: alert and appropriate. No cn findings.Marland Kitchen Upper extremity motor is  5 out of 5.  RLE 3+/5 prox with KE, HF, HAD/HAB to 0/trace distally. LLE 4-55 prox to trace-0/5  distally, left stronger than right.   Reflexes remain absent. sensation appears intact in BUE.  Musculoskeletal: LE muscle wasting especially.            Assessment & Plan:  1.  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  Patient still with significant pain in both legs right more than left with associated motor and sensory loss.             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   2.  Chronic migraine headaches 3.  Chronic cervical spondylosis with facet arthropathy and associated cervicalgia and headaches 4.  Diabetes type II: Improving control 5. Bilateral LE edema. Some improvement 6.  Constipation/ neurogenic bowel 7. Reactive depression?   Plan: 1  continue home health therapies for now with intent of going to outpt later this summer, perhaps at neuro-rehab at [place].  May be a candidate for aquatic therapies at our [place] [facility] as well 2.  Refilled her methadone with further taper -will change to 10-5-10mg this month and then 12-19-08 next month.  With plan for further taper as we move along through this year. We will continue the controlled substance monitoring program, this consists of regular clinic visits, examinations, routine drug screening, pill counts as well as use of [place] Controlled Substance Reporting System. [place].   3.  Continue Nucynta 75 mg every 12 hours as needed to better control neuropathic pain.  no RF needed.  She is using this rarely now 4.   Gabapentin to 600 mg bid and 1229m at bedtime---no taper yet. 5.   Maintain nortriptyline   50 mg at bedtime 6.  [facility]   Continue monthly IVIG 7. Naproxen as needed only for severe pain given ARF 8.  Bowel program with bid miralax, hs senna-s and am suppository 9.  Mood: continue with counseling by Dr. RSima Matasfor pain and stress mgt.   -improving with reduction in meds and functional improvements   15  minutes of face to face patient care time were spent during this visit including counseling with husband. All questions were encouraged and answered. Follow up with me in 2 mos.

## 2021-01-24 NOTE — Telephone Encounter (Signed)
Left message to call back  

## 2021-01-25 DIAGNOSIS — E1139 Type 2 diabetes mellitus with other diabetic ophthalmic complication: Secondary | ICD-10-CM | POA: Diagnosis not present

## 2021-01-25 DIAGNOSIS — E1144 Type 2 diabetes mellitus with diabetic amyotrophy: Secondary | ICD-10-CM | POA: Diagnosis not present

## 2021-01-25 DIAGNOSIS — L89622 Pressure ulcer of left heel, stage 2: Secondary | ICD-10-CM | POA: Diagnosis not present

## 2021-01-25 DIAGNOSIS — L89152 Pressure ulcer of sacral region, stage 2: Secondary | ICD-10-CM | POA: Diagnosis not present

## 2021-01-25 DIAGNOSIS — L89612 Pressure ulcer of right heel, stage 2: Secondary | ICD-10-CM | POA: Diagnosis not present

## 2021-01-25 DIAGNOSIS — E1165 Type 2 diabetes mellitus with hyperglycemia: Secondary | ICD-10-CM | POA: Diagnosis not present

## 2021-01-27 DIAGNOSIS — E1165 Type 2 diabetes mellitus with hyperglycemia: Secondary | ICD-10-CM | POA: Diagnosis not present

## 2021-01-27 DIAGNOSIS — E1139 Type 2 diabetes mellitus with other diabetic ophthalmic complication: Secondary | ICD-10-CM | POA: Diagnosis not present

## 2021-01-27 DIAGNOSIS — E1144 Type 2 diabetes mellitus with diabetic amyotrophy: Secondary | ICD-10-CM | POA: Diagnosis not present

## 2021-01-27 DIAGNOSIS — L89622 Pressure ulcer of left heel, stage 2: Secondary | ICD-10-CM | POA: Diagnosis not present

## 2021-01-27 DIAGNOSIS — L89152 Pressure ulcer of sacral region, stage 2: Secondary | ICD-10-CM | POA: Diagnosis not present

## 2021-01-27 DIAGNOSIS — L89612 Pressure ulcer of right heel, stage 2: Secondary | ICD-10-CM | POA: Diagnosis not present

## 2021-01-28 DIAGNOSIS — L89612 Pressure ulcer of right heel, stage 2: Secondary | ICD-10-CM | POA: Diagnosis not present

## 2021-01-28 DIAGNOSIS — H409 Unspecified glaucoma: Secondary | ICD-10-CM | POA: Diagnosis not present

## 2021-01-28 DIAGNOSIS — E1165 Type 2 diabetes mellitus with hyperglycemia: Secondary | ICD-10-CM | POA: Diagnosis not present

## 2021-01-28 DIAGNOSIS — Z794 Long term (current) use of insulin: Secondary | ICD-10-CM | POA: Diagnosis not present

## 2021-01-28 DIAGNOSIS — L89622 Pressure ulcer of left heel, stage 2: Secondary | ICD-10-CM | POA: Diagnosis not present

## 2021-01-28 DIAGNOSIS — E1144 Type 2 diabetes mellitus with diabetic amyotrophy: Secondary | ICD-10-CM | POA: Diagnosis not present

## 2021-01-28 DIAGNOSIS — M542 Cervicalgia: Secondary | ICD-10-CM | POA: Diagnosis not present

## 2021-01-28 DIAGNOSIS — Z7984 Long term (current) use of oral hypoglycemic drugs: Secondary | ICD-10-CM | POA: Diagnosis not present

## 2021-01-28 DIAGNOSIS — E1139 Type 2 diabetes mellitus with other diabetic ophthalmic complication: Secondary | ICD-10-CM | POA: Diagnosis not present

## 2021-01-29 DIAGNOSIS — H409 Unspecified glaucoma: Secondary | ICD-10-CM | POA: Diagnosis not present

## 2021-01-29 DIAGNOSIS — E1139 Type 2 diabetes mellitus with other diabetic ophthalmic complication: Secondary | ICD-10-CM | POA: Diagnosis not present

## 2021-01-29 DIAGNOSIS — L89612 Pressure ulcer of right heel, stage 2: Secondary | ICD-10-CM | POA: Diagnosis not present

## 2021-01-29 DIAGNOSIS — E1144 Type 2 diabetes mellitus with diabetic amyotrophy: Secondary | ICD-10-CM | POA: Diagnosis not present

## 2021-01-29 DIAGNOSIS — E1165 Type 2 diabetes mellitus with hyperglycemia: Secondary | ICD-10-CM | POA: Diagnosis not present

## 2021-01-29 DIAGNOSIS — L89622 Pressure ulcer of left heel, stage 2: Secondary | ICD-10-CM | POA: Diagnosis not present

## 2021-01-30 NOTE — Addendum Note (Signed)
Addended by: Angela Adam on: 01/30/2021 01:43 PM   Modules accepted: Orders

## 2021-01-30 NOTE — Telephone Encounter (Signed)
Husband aware and would like a referral to be placed.  Referral placed- patient would like to go to [place] since it is closer Provider aware referral was placed

## 2021-02-01 DIAGNOSIS — E1165 Type 2 diabetes mellitus with hyperglycemia: Secondary | ICD-10-CM | POA: Diagnosis not present

## 2021-02-01 DIAGNOSIS — E1144 Type 2 diabetes mellitus with diabetic amyotrophy: Secondary | ICD-10-CM | POA: Diagnosis not present

## 2021-02-01 DIAGNOSIS — E1139 Type 2 diabetes mellitus with other diabetic ophthalmic complication: Secondary | ICD-10-CM | POA: Diagnosis not present

## 2021-02-01 DIAGNOSIS — H409 Unspecified glaucoma: Secondary | ICD-10-CM | POA: Diagnosis not present

## 2021-02-01 DIAGNOSIS — L89622 Pressure ulcer of left heel, stage 2: Secondary | ICD-10-CM | POA: Diagnosis not present

## 2021-02-01 DIAGNOSIS — L89612 Pressure ulcer of right heel, stage 2: Secondary | ICD-10-CM | POA: Diagnosis not present

## 2021-02-05 DIAGNOSIS — E1165 Type 2 diabetes mellitus with hyperglycemia: Secondary | ICD-10-CM | POA: Diagnosis not present

## 2021-02-05 DIAGNOSIS — L89622 Pressure ulcer of left heel, stage 2: Secondary | ICD-10-CM | POA: Diagnosis not present

## 2021-02-05 DIAGNOSIS — E1144 Type 2 diabetes mellitus with diabetic amyotrophy: Secondary | ICD-10-CM | POA: Diagnosis not present

## 2021-02-05 DIAGNOSIS — E1139 Type 2 diabetes mellitus with other diabetic ophthalmic complication: Secondary | ICD-10-CM | POA: Diagnosis not present

## 2021-02-05 DIAGNOSIS — H409 Unspecified glaucoma: Secondary | ICD-10-CM | POA: Diagnosis not present

## 2021-02-05 DIAGNOSIS — L89612 Pressure ulcer of right heel, stage 2: Secondary | ICD-10-CM | POA: Diagnosis not present

## 2021-02-06 ENCOUNTER — Other Ambulatory Visit: Payer: Self-pay | Admitting: Family Medicine

## 2021-02-06 ENCOUNTER — Other Ambulatory Visit: Payer: Self-pay | Admitting: Physical Medicine & Rehabilitation

## 2021-02-06 DIAGNOSIS — G4486 Cervicogenic headache: Secondary | ICD-10-CM

## 2021-02-06 DIAGNOSIS — E1144 Type 2 diabetes mellitus with diabetic amyotrophy: Secondary | ICD-10-CM

## 2021-02-06 DIAGNOSIS — G43711 Chronic migraine without aura, intractable, with status migrainosus: Secondary | ICD-10-CM

## 2021-02-06 DIAGNOSIS — Z794 Long term (current) use of insulin: Secondary | ICD-10-CM

## 2021-02-07 DIAGNOSIS — E1144 Type 2 diabetes mellitus with diabetic amyotrophy: Secondary | ICD-10-CM | POA: Diagnosis not present

## 2021-02-07 DIAGNOSIS — L89622 Pressure ulcer of left heel, stage 2: Secondary | ICD-10-CM | POA: Diagnosis not present

## 2021-02-07 DIAGNOSIS — L89612 Pressure ulcer of right heel, stage 2: Secondary | ICD-10-CM | POA: Diagnosis not present

## 2021-02-07 DIAGNOSIS — H409 Unspecified glaucoma: Secondary | ICD-10-CM | POA: Diagnosis not present

## 2021-02-07 DIAGNOSIS — E1165 Type 2 diabetes mellitus with hyperglycemia: Secondary | ICD-10-CM | POA: Diagnosis not present

## 2021-02-07 DIAGNOSIS — E1139 Type 2 diabetes mellitus with other diabetic ophthalmic complication: Secondary | ICD-10-CM | POA: Diagnosis not present

## 2021-02-08 DIAGNOSIS — E1139 Type 2 diabetes mellitus with other diabetic ophthalmic complication: Secondary | ICD-10-CM | POA: Diagnosis not present

## 2021-02-08 DIAGNOSIS — E1165 Type 2 diabetes mellitus with hyperglycemia: Secondary | ICD-10-CM | POA: Diagnosis not present

## 2021-02-08 DIAGNOSIS — H409 Unspecified glaucoma: Secondary | ICD-10-CM | POA: Diagnosis not present

## 2021-02-08 DIAGNOSIS — E1144 Type 2 diabetes mellitus with diabetic amyotrophy: Secondary | ICD-10-CM | POA: Diagnosis not present

## 2021-02-08 DIAGNOSIS — L89612 Pressure ulcer of right heel, stage 2: Secondary | ICD-10-CM | POA: Diagnosis not present

## 2021-02-08 DIAGNOSIS — L89622 Pressure ulcer of left heel, stage 2: Secondary | ICD-10-CM | POA: Diagnosis not present

## 2021-02-09 ENCOUNTER — Other Ambulatory Visit: Payer: Self-pay | Admitting: *Deleted

## 2021-02-09 DIAGNOSIS — E1165 Type 2 diabetes mellitus with hyperglycemia: Secondary | ICD-10-CM | POA: Diagnosis not present

## 2021-02-09 DIAGNOSIS — L89622 Pressure ulcer of left heel, stage 2: Secondary | ICD-10-CM | POA: Diagnosis not present

## 2021-02-09 DIAGNOSIS — L89612 Pressure ulcer of right heel, stage 2: Secondary | ICD-10-CM | POA: Diagnosis not present

## 2021-02-09 DIAGNOSIS — E1139 Type 2 diabetes mellitus with other diabetic ophthalmic complication: Secondary | ICD-10-CM | POA: Diagnosis not present

## 2021-02-09 DIAGNOSIS — H409 Unspecified glaucoma: Secondary | ICD-10-CM | POA: Diagnosis not present

## 2021-02-09 DIAGNOSIS — E1144 Type 2 diabetes mellitus with diabetic amyotrophy: Secondary | ICD-10-CM | POA: Diagnosis not present

## 2021-02-09 MED ORDER — TOPIRAMATE 50 MG PO TABS: ORAL_TABLET | ORAL | 4 refills | Status: AC

## 2021-02-12 DIAGNOSIS — E1165 Type 2 diabetes mellitus with hyperglycemia: Secondary | ICD-10-CM | POA: Diagnosis not present

## 2021-02-12 DIAGNOSIS — L89612 Pressure ulcer of right heel, stage 2: Secondary | ICD-10-CM | POA: Diagnosis not present

## 2021-02-12 DIAGNOSIS — L89622 Pressure ulcer of left heel, stage 2: Secondary | ICD-10-CM | POA: Diagnosis not present

## 2021-02-12 DIAGNOSIS — E1144 Type 2 diabetes mellitus with diabetic amyotrophy: Secondary | ICD-10-CM | POA: Diagnosis not present

## 2021-02-12 DIAGNOSIS — H409 Unspecified glaucoma: Secondary | ICD-10-CM | POA: Diagnosis not present

## 2021-02-12 DIAGNOSIS — E1139 Type 2 diabetes mellitus with other diabetic ophthalmic complication: Secondary | ICD-10-CM | POA: Diagnosis not present

## 2021-02-13 DIAGNOSIS — L89621 Pressure ulcer of left heel, stage 1: Secondary | ICD-10-CM | POA: Diagnosis not present

## 2021-02-13 DIAGNOSIS — G8929 Other chronic pain: Secondary | ICD-10-CM | POA: Diagnosis not present

## 2021-02-13 DIAGNOSIS — Z79899 Other long term (current) drug therapy: Secondary | ICD-10-CM | POA: Diagnosis not present

## 2021-02-13 DIAGNOSIS — E11621 Type 2 diabetes mellitus with foot ulcer: Secondary | ICD-10-CM | POA: Diagnosis not present

## 2021-02-13 DIAGNOSIS — K222 Esophageal obstruction: Secondary | ICD-10-CM | POA: Diagnosis not present

## 2021-02-13 DIAGNOSIS — M1990 Unspecified osteoarthritis, unspecified site: Secondary | ICD-10-CM | POA: Diagnosis not present

## 2021-02-13 DIAGNOSIS — L89612 Pressure ulcer of right heel, stage 2: Secondary | ICD-10-CM | POA: Diagnosis not present

## 2021-02-13 DIAGNOSIS — H409 Unspecified glaucoma: Secondary | ICD-10-CM | POA: Diagnosis not present

## 2021-02-13 DIAGNOSIS — Z7984 Long term (current) use of oral hypoglycemic drugs: Secondary | ICD-10-CM | POA: Diagnosis not present

## 2021-02-13 DIAGNOSIS — Z794 Long term (current) use of insulin: Secondary | ICD-10-CM | POA: Diagnosis not present

## 2021-02-13 DIAGNOSIS — K219 Gastro-esophageal reflux disease without esophagitis: Secondary | ICD-10-CM | POA: Diagnosis not present

## 2021-02-13 DIAGNOSIS — F4310 Post-traumatic stress disorder, unspecified: Secondary | ICD-10-CM | POA: Diagnosis not present

## 2021-02-14 DIAGNOSIS — E1144 Type 2 diabetes mellitus with diabetic amyotrophy: Secondary | ICD-10-CM | POA: Diagnosis not present

## 2021-02-14 DIAGNOSIS — E1139 Type 2 diabetes mellitus with other diabetic ophthalmic complication: Secondary | ICD-10-CM | POA: Diagnosis not present

## 2021-02-14 DIAGNOSIS — E1165 Type 2 diabetes mellitus with hyperglycemia: Secondary | ICD-10-CM | POA: Diagnosis not present

## 2021-02-14 DIAGNOSIS — H409 Unspecified glaucoma: Secondary | ICD-10-CM | POA: Diagnosis not present

## 2021-02-14 DIAGNOSIS — L89612 Pressure ulcer of right heel, stage 2: Secondary | ICD-10-CM | POA: Diagnosis not present

## 2021-02-14 DIAGNOSIS — L89622 Pressure ulcer of left heel, stage 2: Secondary | ICD-10-CM | POA: Diagnosis not present

## 2021-02-15 DIAGNOSIS — E1139 Type 2 diabetes mellitus with other diabetic ophthalmic complication: Secondary | ICD-10-CM | POA: Diagnosis not present

## 2021-02-15 DIAGNOSIS — L89622 Pressure ulcer of left heel, stage 2: Secondary | ICD-10-CM | POA: Diagnosis not present

## 2021-02-15 DIAGNOSIS — E1144 Type 2 diabetes mellitus with diabetic amyotrophy: Secondary | ICD-10-CM | POA: Diagnosis not present

## 2021-02-15 DIAGNOSIS — H409 Unspecified glaucoma: Secondary | ICD-10-CM | POA: Diagnosis not present

## 2021-02-15 DIAGNOSIS — L89612 Pressure ulcer of right heel, stage 2: Secondary | ICD-10-CM | POA: Diagnosis not present

## 2021-02-15 DIAGNOSIS — E1165 Type 2 diabetes mellitus with hyperglycemia: Secondary | ICD-10-CM | POA: Diagnosis not present

## 2021-02-16 DIAGNOSIS — E1165 Type 2 diabetes mellitus with hyperglycemia: Secondary | ICD-10-CM | POA: Diagnosis not present

## 2021-02-16 DIAGNOSIS — L89622 Pressure ulcer of left heel, stage 2: Secondary | ICD-10-CM | POA: Diagnosis not present

## 2021-02-16 DIAGNOSIS — E1144 Type 2 diabetes mellitus with diabetic amyotrophy: Secondary | ICD-10-CM | POA: Diagnosis not present

## 2021-02-16 DIAGNOSIS — R600 Localized edema: Secondary | ICD-10-CM | POA: Diagnosis not present

## 2021-02-16 DIAGNOSIS — I82401 Acute embolism and thrombosis of unspecified deep veins of right lower extremity: Secondary | ICD-10-CM | POA: Diagnosis not present

## 2021-02-16 DIAGNOSIS — I82441 Acute embolism and thrombosis of right tibial vein: Secondary | ICD-10-CM | POA: Diagnosis not present

## 2021-02-16 DIAGNOSIS — E1139 Type 2 diabetes mellitus with other diabetic ophthalmic complication: Secondary | ICD-10-CM | POA: Diagnosis not present

## 2021-02-16 DIAGNOSIS — L89612 Pressure ulcer of right heel, stage 2: Secondary | ICD-10-CM | POA: Diagnosis not present

## 2021-02-16 DIAGNOSIS — M79605 Pain in left leg: Secondary | ICD-10-CM | POA: Diagnosis not present

## 2021-02-16 DIAGNOSIS — H409 Unspecified glaucoma: Secondary | ICD-10-CM | POA: Diagnosis not present

## 2021-02-16 DIAGNOSIS — M79604 Pain in right leg: Secondary | ICD-10-CM | POA: Diagnosis not present

## 2021-02-19 DIAGNOSIS — L89612 Pressure ulcer of right heel, stage 2: Secondary | ICD-10-CM | POA: Diagnosis not present

## 2021-02-19 DIAGNOSIS — E1165 Type 2 diabetes mellitus with hyperglycemia: Secondary | ICD-10-CM | POA: Diagnosis not present

## 2021-02-19 DIAGNOSIS — H409 Unspecified glaucoma: Secondary | ICD-10-CM | POA: Diagnosis not present

## 2021-02-19 DIAGNOSIS — E1144 Type 2 diabetes mellitus with diabetic amyotrophy: Secondary | ICD-10-CM | POA: Diagnosis not present

## 2021-02-19 DIAGNOSIS — E1139 Type 2 diabetes mellitus with other diabetic ophthalmic complication: Secondary | ICD-10-CM | POA: Diagnosis not present

## 2021-02-19 DIAGNOSIS — L89622 Pressure ulcer of left heel, stage 2: Secondary | ICD-10-CM | POA: Diagnosis not present

## 2021-02-21 ENCOUNTER — Encounter: Payer: Self-pay | Admitting: Family Medicine

## 2021-02-21 ENCOUNTER — Ambulatory Visit (INDEPENDENT_AMBULATORY_CARE_PROVIDER_SITE_OTHER): Payer: Medicare Other | Admitting: Family Medicine

## 2021-02-21 ENCOUNTER — Other Ambulatory Visit: Payer: Self-pay

## 2021-02-21 VITALS — BP 104/64 | HR 75 | Temp 97.4°F | Ht 64.0 in | Wt 159.2 lb

## 2021-02-21 DIAGNOSIS — Z23 Encounter for immunization: Secondary | ICD-10-CM

## 2021-02-21 DIAGNOSIS — E1142 Type 2 diabetes mellitus with diabetic polyneuropathy: Secondary | ICD-10-CM

## 2021-02-21 DIAGNOSIS — T402X5A Adverse effect of other opioids, initial encounter: Secondary | ICD-10-CM | POA: Diagnosis not present

## 2021-02-21 DIAGNOSIS — I82441 Acute embolism and thrombosis of right tibial vein: Secondary | ICD-10-CM

## 2021-02-21 DIAGNOSIS — G6181 Chronic inflammatory demyelinating polyneuritis: Secondary | ICD-10-CM

## 2021-02-21 DIAGNOSIS — K5903 Drug induced constipation: Secondary | ICD-10-CM | POA: Diagnosis not present

## 2021-02-21 DIAGNOSIS — Z794 Long term (current) use of insulin: Secondary | ICD-10-CM | POA: Diagnosis not present

## 2021-02-21 DIAGNOSIS — L89612 Pressure ulcer of right heel, stage 2: Secondary | ICD-10-CM

## 2021-02-21 LAB — BAYER DCA HB A1C WAIVED: HB A1C (BAYER DCA - WAIVED): 9.1 % — ABNORMAL HIGH (ref <7.0)

## 2021-02-21 MED ORDER — DAPAGLIFLOZIN PROPANEDIOL 10 MG PO TABS: 10.0000 mg | ORAL_TABLET | Freq: Every day | ORAL | 2 refills | Status: AC

## 2021-02-21 MED ORDER — LUBIPROSTONE 24 MCG PO CAPS: 24.0000 ug | ORAL_CAPSULE | Freq: Two times a day (BID) | ORAL | 2 refills | Status: AC

## 2021-02-21 MED ORDER — TRULANCE 3 MG PO TABS: 3.0000 mg | ORAL_TABLET | Freq: Every day | ORAL | 2 refills | Status: DC

## 2021-02-21 MED ORDER — METFORMIN HCL ER 500 MG PO TB24: 1000.0000 mg | ORAL_TABLET | Freq: Two times a day (BID) | ORAL | 1 refills | Status: AC

## 2021-02-22 ENCOUNTER — Encounter: Payer: Self-pay | Admitting: Family Medicine

## 2021-02-22 DIAGNOSIS — I82441 Acute embolism and thrombosis of right tibial vein: Secondary | ICD-10-CM | POA: Insufficient documentation

## 2021-02-22 LAB — LIPID PANEL
Chol/HDL Ratio: 3.2 ratio (ref 0.0–4.4)
Cholesterol, Total: 110 mg/dL (ref 100–199)
HDL: 34 mg/dL — ABNORMAL LOW (ref >39)
LDL Chol Calc (NIH): 42 mg/dL (ref 0–99)
Triglycerides: 215 mg/dL — ABNORMAL HIGH (ref 0–149)
VLDL Cholesterol Cal: 34 mg/dL (ref 5–40)

## 2021-02-22 LAB — CMP14+EGFR
ALT: 15 IU/L (ref 0–32)
AST: 12 IU/L (ref 0–40)
Albumin/Globulin Ratio: 1 — ABNORMAL LOW (ref 1.2–2.2)
Albumin: 3.9 g/dL (ref 3.8–4.8)
Alkaline Phosphatase: 207 IU/L — ABNORMAL HIGH (ref 44–121)
BUN/Creatinine Ratio: 15 (ref 12–28)
BUN: 17 mg/dL (ref 8–27)
Bilirubin Total: <0.2 mg/dL (ref 0.0–1.2)
CO2: 24 mmol/L (ref 20–29)
Calcium: 9.2 mg/dL (ref 8.7–10.3)
Chloride: 96 mmol/L (ref 96–106)
Creatinine, Ser: 1.11 mg/dL — ABNORMAL HIGH (ref 0.57–1.00)
Globulin, Total: 3.9 g/dL (ref 1.5–4.5)
Glucose: 318 mg/dL — ABNORMAL HIGH (ref 65–99)
Potassium: 4.8 mmol/L (ref 3.5–5.2)
Sodium: 137 mmol/L (ref 134–144)
Total Protein: 7.8 g/dL (ref 6.0–8.5)
eGFR: 54 mL/min/{1.73_m2} — ABNORMAL LOW (ref >59)

## 2021-02-22 LAB — CBC WITH DIFFERENTIAL/PLATELET
Basophils Absolute: 0 10*3/uL (ref 0.0–0.2)
Basos: 1 %
EOS (ABSOLUTE): 0.1 10*3/uL (ref 0.0–0.4)
Eos: 2 %
Hematocrit: 34.9 % (ref 34.0–46.6)
Hemoglobin: 10.6 g/dL — ABNORMAL LOW (ref 11.1–15.9)
Immature Grans (Abs): 0 10*3/uL (ref 0.0–0.1)
Immature Granulocytes: 0 %
Lymphocytes Absolute: 1.5 10*3/uL (ref 0.7–3.1)
Lymphs: 25 %
MCH: 26 pg — ABNORMAL LOW (ref 26.6–33.0)
MCHC: 30.4 g/dL — ABNORMAL LOW (ref 31.5–35.7)
MCV: 86 fL (ref 79–97)
Monocytes Absolute: 0.4 10*3/uL (ref 0.1–0.9)
Monocytes: 7 %
Neutrophils Absolute: 3.7 10*3/uL (ref 1.4–7.0)
Neutrophils: 65 %
Platelets: 191 10*3/uL (ref 150–450)
RBC: 4.07 x10E6/uL (ref 3.77–5.28)
RDW: 15.5 % — ABNORMAL HIGH (ref 11.7–15.4)
WBC: 5.8 10*3/uL (ref 3.4–10.8)

## 2021-02-22 NOTE — Progress Notes (Signed)
Assessment & Plan:  1. Type 2 diabetes mellitus with diabetic polyneuropathy, with long-term current use of insulin (HCC) Lab Results  Component Value Date   HGBA1C 9.1 (H) 02/21/2021   HGBA1C 11.2 (H) 12/01/2020   HGBA1C 8.6 (H) 02/23/2020    - Diabetes is not at goal of A1c < 7, but is improving. - Medications:  continue Lantus 25 units daily and Metformin 1,000 mg BID. Increase Farxiga from 5 mg to 10 mg once daily. Discussed goal of getting off insulin in the future. - Home glucose monitoring: continue monitoring. - Patient is currently taking a statin. Patient is not taking an ACE-inhibitor/ARB.  - Instruction/counseling given: reminded to get eye exam  Diabetes Health Maintenance Due  Topic Date Due   OPHTHALMOLOGY EXAM  04/05/2020   FOOT EXAM  02/01/2021   HEMOGLOBIN A1C  08/24/2021   URINE MICROALBUMIN  12/06/2021    Lab Results  Component Value Date   LABMICR See below: 12/11/2020   LABMICR 7.1 12/06/2020   - Lipid panel - CBC with Differential/Platelet - CMP14+EGFR - Bayer DCA Hb A1c Waived - metFORMIN (GLUCOPHAGE-XR) 500 MG 24 hr tablet; Take 2 tablets (1,000 mg total) by mouth 2 (two) times daily with a meal.  Dispense: 360 tablet; Refill: 1 - dapagliflozin propanediol (FARXIGA) 10 MG TABS tablet; Take 1 tablet (10 mg total) by mouth daily before breakfast.  Dispense: 30 tablet; Refill: 2  2. CIDP (chronic inflammatory demyelinating polyneuropathy) ([place]) Managed by neurology.   3. Constipation due to opioid therapy Started Amitiza. Unable to afford Linzess. - lubiprostone (AMITIZA) 24 MCG capsule; Take 1 capsule (24 mcg total) by mouth 2 (two) times daily with a meal.  Dispense: 60 capsule; Refill: 2  4. Pressure injury of right heel, stage 2 ([place]) Managed by the wound center.  5. Acute deep vein thrombosis (DVT) of tibial vein of right lower extremity (HCC) On Eliquis.  6. Immunization due I have called patients neurologist and left a message to get  their input on Shingrix since she is getting IVIG infusions every 4 weeks.    Return in about 3 months (around 05/24/2021) for follow-up of chronic medication conditions.  [name], MSN, APRN, FNP-C [facility]  Subjective:    Patient ID: Monica [name], female    DOB: [birth year], 70 y.o.   MRN: [id number]  Patient Care Team: Loman Brooklyn, FNP as PCP - General (Family Medicine) Herminio Commons, MD (Inactive) as PCP - Cardiology (Cardiology) Daneil Dolin, MD (Gastroenterology) Tyson Dense, MD as Consulting Physician (Obstetrics and Gynecology) Meredith Staggers, MD as Consulting Physician (Physical Medicine and Rehabilitation)   Chief Complaint:  Chief Complaint  Patient presents with   Diabetes    6 week follow up      HPI: LEONELA KIVI is a 70 y.o. female presenting on 02/21/2021 for Diabetes (6 week follow up /)  Patient is accompanied by her husband who she is okay with being present.  He is her caregiver.  Diabetes: Patient presents for follow up of diabetes. Current symptoms include: hyperglycemia. Known diabetic complications: peripheral neuropathy and lumbosacral plexitis versus CIDP . Medication compliance: Yes. Current diet: in general, a "healthy" diet  . Current exercise: none. Home blood sugar records:  90s-160 . Is she  on ACE inhibitor or angiotensin II receptor blocker? No. Is she on a statin? Yes.   Constipation: Patient was previously using Dulcolax suppositories, oral Dulcolax, and magnesium for constipation.  She  was prescribed Linzess, which cost ~$500 and caused her to have diarrhea.  Her husband has been giving her MiraLAX and mineral oil if she has not had a bowel movement in a week, which immediately produces a bowel movement. Currently she is only having a bowel movement weekly and it is like hard golf balls. Her stomach is distended. Last BM was last night.  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 Madie Reno, neurologist at [facility].  She has been doing physical therapy at home.  There has been some improvement as patient is able to complete all transfers by herself as well as walk short distances with her walker.  She is getting home infusions of IVIG every 4 weeks; her last one was late.   New complaints: Patient has wounds on bilateral heels and was seen by the wound center as referred by me. She has been diagnosed with a DVT and is on Eliquis.    Social history:  Relevant past medical, surgical, family and social history reviewed and updated as indicated. Interim medical history since our last visit reviewed.  Allergies and medications reviewed and updated.  DATA REVIEWED: CHART IN EPIC  ROS: Negative unless specifically indicated above in HPI.    Current Outpatient Medications:    acetaminophen (TYLENOL) 325 MG tablet, Take 1-2 tablets (325-650 mg total) by mouth every 4 (four) hours as needed for mild pain., Disp: , Rfl:    atorvastatin (LIPITOR) 10 MG tablet, TAKE 1 TABLET BY MOUTH ONCE DAILY., Disp: 90 tablet, Rfl: 1   ELIQUIS 5 MG TABS tablet, Take 5 mg by mouth 2 (two) times daily., Disp: , Rfl:    gabapentin (NEURONTIN) 600 MG tablet, TAKE 1 TABLET 3 TIMES DAILY AND 2 TABLETS AT 8 PM DAILY., Disp: 150 tablet, Rfl: 0   IMMUNE GLOBULIN 10% 10 GM/100ML SOLN, Inject into the vein., Disp: , Rfl:    Insulin Glargine (LANTUS Edgar), Inject 25 Units into the skin daily., Disp: , Rfl:    Insulin Pen Needle (GLOBAL EASE INJECT PEN NEEDLES) 31G X 5 MM MISC, Use 1 pen needle every morning, noon, in the evening and at bedtime Dx E11.65, Disp: 100 each, Rfl: 11   LANTUS SOLOSTAR 100 UNIT/ML Solostar Pen, INJECT 20 UNITS INTO THE SKIN TWICE DAILY., Disp: 15 mL, Rfl: 0   lubiprostone (AMITIZA) 24 MCG capsule, Take 1 capsule (24 mcg total) by mouth 2 (two) times daily with a meal., Disp: 60 capsule, Rfl: 2   methadone (DOLOPHINE) 10 MG tablet, TAKE 1 TABLET THREE X DAILY., Disp: 90  tablet, Rfl: 0   nortriptyline (PAMELOR) 50 MG capsule, TAKE 1 CAPSULE BY MOUTH AT BEDTIME., Disp: 30 capsule, Rfl: 2   Polyethylene Glycol 3350 (MIRALAX PO), 17 gram DAILY (route: oral), Disp: , Rfl:    tapentadol HCl (NUCYNTA) 75 MG tablet, Take 1 tablet (75 mg total) by mouth every 12 (twelve) hours as needed., Disp: 60 tablet, Rfl: 0   topiramate (TOPAMAX) 50 MG tablet, TAKE 2 TABLETS IN THE MORNING AND 4 TABLETS AT BEDTIME., Disp: 180 tablet, Rfl: 4   dapagliflozin propanediol (FARXIGA) 10 MG TABS tablet, Take 1 tablet (10 mg total) by mouth daily before breakfast., Disp: 30 tablet, Rfl: 2   metFORMIN (GLUCOPHAGE-XR) 500 MG 24 hr tablet, Take 2 tablets (1,000 mg total) by mouth 2 (two) times daily with a meal., Disp: 360 tablet, Rfl: 1   Allergies  Allergen Reactions   Aspirin Anaphylaxis and Hives    Slurred speech,  blurred vision- also   Hydromorphone Hcl Shortness Of Breath and Other (See Comments)    Severe shortness of breath   Vimpat [Lacosamide] Other (See Comments)    Hallucinations, out of body experience, confusion    Depakote [Divalproex Sodium] Other (See Comments)    Reaction not recalled by husband   Milnacipran Hcl Other (See Comments)    Dizziness    Pristiq [Desvenlafaxine Succinate Monohydrate] Other (See Comments)    Dizziness    Pristiq [Desvenlafaxine]    Capsaicin Rash and Other (See Comments)    Red rash   Gabapentin Other (See Comments)    States leg pain worsened, pt d/c'd   Past Medical History:  Diagnosis Date   Arthritis    Cervical facet joint syndrome 04/22/2012   Cervicalgia    Chronic headaches    Dr Tessa Lerner [place] [facility]   Chronic nausea    normal GES    Complication of anesthesia    had problem with local anesthesia-tried to assist Physician   DM (diabetes mellitus) ([place])    Elevated liver enzymes    hx of, normal 03/2010   GERD (gastroesophageal reflux disease) 05/31/2011   History Schatzki's ring, erosive reflux esophagitis,  status post EGD and dilation 8/11 and 11/20   Glaucoma    lens in both eyes   Hemorrhoids 11/17/08   Colonoscopy Dr Laural Golden   Occipital neuralgia    PTSD (post-traumatic stress disorder)    Renal lithiasis    Seizure ([place])    12 yrs ago-med related   Torticollis    Unspecified musculoskeletal disorders and symptoms referable to neck    cervical/trapezius    Past Surgical History:  Procedure Laterality Date   BREAST LUMPECTOMY     left   CATARACT EXTRACTION W/PHACO Left 10/07/2016   Procedure: CATARACT EXTRACTION PHACO AND INTRAOCULAR LENS PLACEMENT ([place]);  Surgeon: Tonny Branch, MD;  Location: [facility];  Service: Ophthalmology;  Laterality: Left;  CDE: 4.98   CATARACT EXTRACTION W/PHACO Right 10/21/2016   Procedure: CATARACT EXTRACTION PHACO AND INTRAOCULAR LENS PLACEMENT RIGHT EYE CDE - 6.24;  Surgeon: Tonny Branch, MD;  Location: [facility];  Service: Ophthalmology;  Laterality: Right;  right   CHOLECYSTECTOMY     COLONOSCOPY  11/17/08   ext hemorrhoids   CRANIOTOMY     secondary TBI   CYST EXCISION     left wrist   ESOPHAGOGASTRODUODENOSCOPY  04/05/10   [name] (65F),erosive reflux esophagitis, small hiatal hernia,   ESOPHAGOGASTRODUODENOSCOPY (EGD) WITH PROPOFOL N/A 08/30/2019   Procedure: ESOPHAGOGASTRODUODENOSCOPY (EGD) WITH PROPOFOL;  Surgeon: Daneil Dolin, MD;  [facility];  Service: Endoscopy;  Laterality: N/A;  3:30pm - pt knows to arrive at 9:45   [place] LITHOTRIPSY Left 12/13/2019   Procedure: EXTRACORPOREAL SHOCK WAVE LITHOTRIPSY (ESWL);  Surgeon: Festus Aloe, MD;  Location: [facility];  Service: Urology;  Laterality: Left;   FINGER SURGERY     removal cyst left pinky   gastro empy study  03/03/10   normal   HAND SURGERY     MALONEY DILATION N/A 08/30/2019   Procedure: Venia Minks DILATION;  Surgeon: Daneil Dolin, MD;  [facility];  Service: Endoscopy;  Laterality: N/A;   NECK SURGERY  1998    NEPHROLITHOTOMY  05/13/2012   Procedure: NEPHROLITHOTOMY PERCUTANEOUS;  Surgeon: Franchot Gallo, MD;  Location: [facility];  Service: Urology;  Laterality: Left;      SURAL NERVE BX Right 02/28/2020   Procedure: SURAL  NERVE / Quadricep BIOPSY;  Surgeon: Eustace Moore, MD;  Location: [place];  Service: Neurosurgery;  Laterality: Right;    Social History   Socioeconomic History   Marital status: Married    Spouse name: Not on file   Number of children: 3   Years of education: Not on file   Highest education level: Not on file  Occupational History   Occupation: disabled    Employer: UNEMPLOYED  Tobacco Use   Smoking status: Never   Smokeless tobacco: Never  Vaping Use   Vaping Use: Never used  Substance and Sexual Activity   Alcohol use: No   Drug use: No   Sexual activity: Yes    Birth control/protection: Post-menopausal  Other Topics Concern   Not on file  Social History Narrative   Lives at home with husband - who is full time caretaker - She has Chronic Inflammatory Demyelinating Polyradiculoneuropathy per her husband - Making her mobility very limited   Right handed   Caffeine: none   Social Determinants of Radio broadcast assistant Strain: Low Risk    Difficulty of Paying Living Expenses: Not hard at all  Food Insecurity: No Food Insecurity   Worried About Charity fundraiser in the Last Year: Never true   [employer] in the Last Year: Never true  Transportation Needs: No Transportation Needs   Lack of Transportation (Medical): No   Lack of Transportation (Non-Medical): No  Physical Activity: Insufficiently Active   Days of Exercise per Week: 2 days   Minutes of Exercise per Session: 30 min  Stress: No Stress Concern Present   Feeling of Stress : Only a little  Social Connections: Moderately Integrated   Frequency of Communication with Friends and Family: Twice a week   Frequency of Social Gatherings with Friends and Family: Twice a week   Attends Religious  Services: Never   Marine scientist or Organizations: Yes   Attends Music therapist: 1 to 4 times per year   Marital Status: Married  Human resources officer Violence: Not At Risk   Fear of Current or Ex-Partner: No   Emotionally Abused: No   Physically Abused: No   Sexually Abused: No        Objective:    BP 104/64   Pulse 75   Temp (!) 97.4 F (36.3 C) (Temporal)   Ht 5' 4" (1.626 m)   Wt 159 lb 3.2 oz (72.2 kg)   SpO2 94%   BMI 27.33 kg/m   Wt Readings from Last 3 Encounters:  02/21/21 159 lb 3.2 oz (72.2 kg)  01/03/21 150 lb (68 kg)  04/19/20 132 lb (59.9 kg)    Physical Exam Vitals reviewed.  Constitutional:      General: She is not in acute distress.    Appearance: Normal appearance. She is overweight. She is not ill-appearing, toxic-appearing or diaphoretic.  HENT:     Head: Normocephalic and atraumatic.  Eyes:     General: No scleral icterus.       Right eye: No discharge.        Left eye: No discharge.     Conjunctiva/sclera: Conjunctivae normal.  Cardiovascular:     Rate and Rhythm: Normal rate and regular rhythm.     Heart sounds: Normal heart sounds. No murmur heard.   No friction rub. No gallop.  Pulmonary:     Effort: Pulmonary effort is normal. No respiratory distress.     Breath sounds:  Normal breath sounds. No stridor. No wheezing, rhonchi or rales.  Abdominal:     General: There is distension.     Palpations: Abdomen is soft.  Musculoskeletal:        General: Normal range of motion.     Cervical back: Normal range of motion.  Skin:    General: Skin is warm and dry.     Capillary Refill: Capillary refill takes less than 2 seconds.  Neurological:     General: No focal deficit present.     Mental Status: She is alert and oriented to person, place, and time. Mental status is at baseline.     Gait: Gait abnormal (riding in [facility]).  Psychiatric:        Mood and Affect: Mood normal.        Behavior: Behavior normal.        Thought  Content: Thought content normal.        Judgment: Judgment normal.    No results found for: TSH Lab Results  Component Value Date   WBC 5.8 02/21/2021   HGB 10.6 (L) 02/21/2021   HCT 34.9 02/21/2021   MCV 86 02/21/2021   PLT 191 02/21/2021   Lab Results  Component Value Date   NA 137 02/21/2021   K 4.8 02/21/2021   CO2 24 02/21/2021   GLUCOSE 318 (H) 02/21/2021   BUN 17 02/21/2021   CREATININE 1.11 (H) 02/21/2021   BILITOT <0.2 02/21/2021   ALKPHOS 207 (H) 02/21/2021   AST 12 02/21/2021   ALT 15 02/21/2021   PROT 7.8 02/21/2021   ALBUMIN 3.9 02/21/2021   CALCIUM 9.2 02/21/2021   ANIONGAP 9 03/20/2020   EGFR 54 (L) 02/21/2021   Lab Results  Component Value Date   CHOL 110 02/21/2021   Lab Results  Component Value Date   HDL 34 (L) 02/21/2021   Lab Results  Component Value Date   LDLCALC 42 02/21/2021   Lab Results  Component Value Date   TRIG 215 (H) 02/21/2021   Lab Results  Component Value Date   CHOLHDL 3.2 02/21/2021   Lab Results  Component Value Date   HGBA1C 9.1 (H) 02/21/2021

## 2021-02-23 DIAGNOSIS — L89612 Pressure ulcer of right heel, stage 2: Secondary | ICD-10-CM | POA: Diagnosis not present

## 2021-02-23 DIAGNOSIS — L89622 Pressure ulcer of left heel, stage 2: Secondary | ICD-10-CM | POA: Diagnosis not present

## 2021-02-23 DIAGNOSIS — E1144 Type 2 diabetes mellitus with diabetic amyotrophy: Secondary | ICD-10-CM | POA: Diagnosis not present

## 2021-02-23 DIAGNOSIS — E1139 Type 2 diabetes mellitus with other diabetic ophthalmic complication: Secondary | ICD-10-CM | POA: Diagnosis not present

## 2021-02-23 DIAGNOSIS — H409 Unspecified glaucoma: Secondary | ICD-10-CM | POA: Diagnosis not present

## 2021-02-23 DIAGNOSIS — E1165 Type 2 diabetes mellitus with hyperglycemia: Secondary | ICD-10-CM | POA: Diagnosis not present

## 2021-02-27 DIAGNOSIS — H40033 Anatomical narrow angle, bilateral: Secondary | ICD-10-CM | POA: Diagnosis not present

## 2021-02-27 DIAGNOSIS — L89612 Pressure ulcer of right heel, stage 2: Secondary | ICD-10-CM | POA: Diagnosis not present

## 2021-02-27 DIAGNOSIS — E1144 Type 2 diabetes mellitus with diabetic amyotrophy: Secondary | ICD-10-CM | POA: Diagnosis not present

## 2021-02-27 DIAGNOSIS — L89622 Pressure ulcer of left heel, stage 2: Secondary | ICD-10-CM | POA: Diagnosis not present

## 2021-02-27 DIAGNOSIS — H10013 Acute follicular conjunctivitis, bilateral: Secondary | ICD-10-CM | POA: Diagnosis not present

## 2021-02-27 DIAGNOSIS — H409 Unspecified glaucoma: Secondary | ICD-10-CM | POA: Diagnosis not present

## 2021-02-27 DIAGNOSIS — E1165 Type 2 diabetes mellitus with hyperglycemia: Secondary | ICD-10-CM | POA: Diagnosis not present

## 2021-02-27 DIAGNOSIS — Z794 Long term (current) use of insulin: Secondary | ICD-10-CM | POA: Diagnosis not present

## 2021-02-27 DIAGNOSIS — E11621 Type 2 diabetes mellitus with foot ulcer: Secondary | ICD-10-CM | POA: Diagnosis not present

## 2021-02-27 DIAGNOSIS — F4310 Post-traumatic stress disorder, unspecified: Secondary | ICD-10-CM | POA: Diagnosis not present

## 2021-02-27 DIAGNOSIS — M542 Cervicalgia: Secondary | ICD-10-CM | POA: Diagnosis not present

## 2021-02-27 DIAGNOSIS — E1139 Type 2 diabetes mellitus with other diabetic ophthalmic complication: Secondary | ICD-10-CM | POA: Diagnosis not present

## 2021-02-27 DIAGNOSIS — K219 Gastro-esophageal reflux disease without esophagitis: Secondary | ICD-10-CM | POA: Diagnosis not present

## 2021-02-27 DIAGNOSIS — I82561 Chronic embolism and thrombosis of right calf muscular vein: Secondary | ICD-10-CM | POA: Diagnosis not present

## 2021-02-27 DIAGNOSIS — Z7984 Long term (current) use of oral hypoglycemic drugs: Secondary | ICD-10-CM | POA: Diagnosis not present

## 2021-03-01 ENCOUNTER — Telehealth: Payer: Self-pay

## 2021-03-01 DIAGNOSIS — E1139 Type 2 diabetes mellitus with other diabetic ophthalmic complication: Secondary | ICD-10-CM | POA: Diagnosis not present

## 2021-03-01 DIAGNOSIS — L89612 Pressure ulcer of right heel, stage 2: Secondary | ICD-10-CM | POA: Diagnosis not present

## 2021-03-01 DIAGNOSIS — H409 Unspecified glaucoma: Secondary | ICD-10-CM | POA: Diagnosis not present

## 2021-03-01 DIAGNOSIS — E1144 Type 2 diabetes mellitus with diabetic amyotrophy: Secondary | ICD-10-CM | POA: Diagnosis not present

## 2021-03-01 DIAGNOSIS — L89622 Pressure ulcer of left heel, stage 2: Secondary | ICD-10-CM | POA: Diagnosis not present

## 2021-03-01 DIAGNOSIS — E1165 Type 2 diabetes mellitus with hyperglycemia: Secondary | ICD-10-CM | POA: Diagnosis not present

## 2021-03-01 NOTE — Telephone Encounter (Signed)
Drug  Lubiprostone capsules  Key: [id number] - [place] Case ID: [id number]  Sent to plan

## 2021-03-02 NOTE — Telephone Encounter (Signed)
Denied today Request Reference Number: [id number]. LUBIPROSTONE CAP is denied for not meeting the prior authorization requirement(s).

## 2021-03-02 NOTE — Telephone Encounter (Signed)
Did it tell us what the prior authorization requirements were?

## 2021-03-02 NOTE — Telephone Encounter (Signed)
Faxed form filled out and faxed back to [facility]

## 2021-03-05 DIAGNOSIS — M79661 Pain in right lower leg: Secondary | ICD-10-CM | POA: Diagnosis not present

## 2021-03-05 DIAGNOSIS — L89612 Pressure ulcer of right heel, stage 2: Secondary | ICD-10-CM | POA: Diagnosis not present

## 2021-03-05 DIAGNOSIS — M79662 Pain in left lower leg: Secondary | ICD-10-CM | POA: Diagnosis not present

## 2021-03-05 DIAGNOSIS — H409 Unspecified glaucoma: Secondary | ICD-10-CM | POA: Diagnosis not present

## 2021-03-05 DIAGNOSIS — L89622 Pressure ulcer of left heel, stage 2: Secondary | ICD-10-CM | POA: Diagnosis not present

## 2021-03-05 DIAGNOSIS — E1165 Type 2 diabetes mellitus with hyperglycemia: Secondary | ICD-10-CM | POA: Diagnosis not present

## 2021-03-05 DIAGNOSIS — E1144 Type 2 diabetes mellitus with diabetic amyotrophy: Secondary | ICD-10-CM | POA: Diagnosis not present

## 2021-03-05 DIAGNOSIS — E1139 Type 2 diabetes mellitus with other diabetic ophthalmic complication: Secondary | ICD-10-CM | POA: Diagnosis not present

## 2021-03-05 DIAGNOSIS — M7989 Other specified soft tissue disorders: Secondary | ICD-10-CM | POA: Diagnosis not present

## 2021-03-05 NOTE — Telephone Encounter (Signed)
It looks like recommendations are MOVANTIK or RELISTOR

## 2021-03-06 ENCOUNTER — Ambulatory Visit: Payer: Medicare Other | Admitting: Family Medicine

## 2021-03-07 DIAGNOSIS — H409 Unspecified glaucoma: Secondary | ICD-10-CM | POA: Diagnosis not present

## 2021-03-07 DIAGNOSIS — L89622 Pressure ulcer of left heel, stage 2: Secondary | ICD-10-CM | POA: Diagnosis not present

## 2021-03-07 DIAGNOSIS — E1139 Type 2 diabetes mellitus with other diabetic ophthalmic complication: Secondary | ICD-10-CM | POA: Diagnosis not present

## 2021-03-07 DIAGNOSIS — L89612 Pressure ulcer of right heel, stage 2: Secondary | ICD-10-CM | POA: Diagnosis not present

## 2021-03-07 DIAGNOSIS — E1165 Type 2 diabetes mellitus with hyperglycemia: Secondary | ICD-10-CM | POA: Diagnosis not present

## 2021-03-07 DIAGNOSIS — E1144 Type 2 diabetes mellitus with diabetic amyotrophy: Secondary | ICD-10-CM | POA: Diagnosis not present

## 2021-03-08 DIAGNOSIS — E1144 Type 2 diabetes mellitus with diabetic amyotrophy: Secondary | ICD-10-CM | POA: Diagnosis not present

## 2021-03-08 DIAGNOSIS — H409 Unspecified glaucoma: Secondary | ICD-10-CM | POA: Diagnosis not present

## 2021-03-08 DIAGNOSIS — E1165 Type 2 diabetes mellitus with hyperglycemia: Secondary | ICD-10-CM | POA: Diagnosis not present

## 2021-03-08 DIAGNOSIS — E1139 Type 2 diabetes mellitus with other diabetic ophthalmic complication: Secondary | ICD-10-CM | POA: Diagnosis not present

## 2021-03-08 DIAGNOSIS — L89622 Pressure ulcer of left heel, stage 2: Secondary | ICD-10-CM | POA: Diagnosis not present

## 2021-03-08 DIAGNOSIS — L89612 Pressure ulcer of right heel, stage 2: Secondary | ICD-10-CM | POA: Diagnosis not present

## 2021-03-11 DIAGNOSIS — E1165 Type 2 diabetes mellitus with hyperglycemia: Secondary | ICD-10-CM | POA: Diagnosis not present

## 2021-03-13 DIAGNOSIS — E1139 Type 2 diabetes mellitus with other diabetic ophthalmic complication: Secondary | ICD-10-CM | POA: Diagnosis not present

## 2021-03-13 DIAGNOSIS — E1144 Type 2 diabetes mellitus with diabetic amyotrophy: Secondary | ICD-10-CM | POA: Diagnosis not present

## 2021-03-13 DIAGNOSIS — H409 Unspecified glaucoma: Secondary | ICD-10-CM | POA: Diagnosis not present

## 2021-03-13 DIAGNOSIS — L89612 Pressure ulcer of right heel, stage 2: Secondary | ICD-10-CM | POA: Diagnosis not present

## 2021-03-13 DIAGNOSIS — E1165 Type 2 diabetes mellitus with hyperglycemia: Secondary | ICD-10-CM | POA: Diagnosis not present

## 2021-03-13 DIAGNOSIS — L89622 Pressure ulcer of left heel, stage 2: Secondary | ICD-10-CM | POA: Diagnosis not present

## 2021-03-15 ENCOUNTER — Other Ambulatory Visit: Payer: Self-pay | Admitting: Physical Medicine & Rehabilitation

## 2021-03-15 DIAGNOSIS — L89622 Pressure ulcer of left heel, stage 2: Secondary | ICD-10-CM | POA: Diagnosis not present

## 2021-03-15 DIAGNOSIS — L89612 Pressure ulcer of right heel, stage 2: Secondary | ICD-10-CM | POA: Diagnosis not present

## 2021-03-15 DIAGNOSIS — E1144 Type 2 diabetes mellitus with diabetic amyotrophy: Secondary | ICD-10-CM | POA: Diagnosis not present

## 2021-03-15 DIAGNOSIS — H409 Unspecified glaucoma: Secondary | ICD-10-CM | POA: Diagnosis not present

## 2021-03-15 DIAGNOSIS — G43711 Chronic migraine without aura, intractable, with status migrainosus: Secondary | ICD-10-CM

## 2021-03-15 DIAGNOSIS — E1139 Type 2 diabetes mellitus with other diabetic ophthalmic complication: Secondary | ICD-10-CM | POA: Diagnosis not present

## 2021-03-15 DIAGNOSIS — E1165 Type 2 diabetes mellitus with hyperglycemia: Secondary | ICD-10-CM | POA: Diagnosis not present

## 2021-03-15 DIAGNOSIS — G4486 Cervicogenic headache: Secondary | ICD-10-CM

## 2021-03-15 NOTE — Telephone Encounter (Signed)
Done

## 2021-03-15 NOTE — Telephone Encounter (Signed)
Refill request Gabapentin and Methadone.

## 2021-03-16 ENCOUNTER — Ambulatory Visit: Payer: Self-pay

## 2021-03-19 DIAGNOSIS — M79606 Pain in leg, unspecified: Secondary | ICD-10-CM | POA: Diagnosis not present

## 2021-03-19 DIAGNOSIS — M7989 Other specified soft tissue disorders: Secondary | ICD-10-CM | POA: Diagnosis not present

## 2021-03-19 DIAGNOSIS — L89612 Pressure ulcer of right heel, stage 2: Secondary | ICD-10-CM | POA: Diagnosis not present

## 2021-03-19 DIAGNOSIS — Z86718 Personal history of other venous thrombosis and embolism: Secondary | ICD-10-CM | POA: Diagnosis not present

## 2021-03-19 DIAGNOSIS — Z7901 Long term (current) use of anticoagulants: Secondary | ICD-10-CM | POA: Diagnosis not present

## 2021-03-20 DIAGNOSIS — Z794 Long term (current) use of insulin: Secondary | ICD-10-CM | POA: Diagnosis not present

## 2021-03-20 DIAGNOSIS — E1140 Type 2 diabetes mellitus with diabetic neuropathy, unspecified: Secondary | ICD-10-CM | POA: Diagnosis not present

## 2021-03-20 DIAGNOSIS — L89612 Pressure ulcer of right heel, stage 2: Secondary | ICD-10-CM | POA: Diagnosis not present

## 2021-03-20 DIAGNOSIS — K219 Gastro-esophageal reflux disease without esophagitis: Secondary | ICD-10-CM | POA: Diagnosis not present

## 2021-03-20 DIAGNOSIS — Z7984 Long term (current) use of oral hypoglycemic drugs: Secondary | ICD-10-CM | POA: Diagnosis not present

## 2021-03-20 DIAGNOSIS — I82441 Acute embolism and thrombosis of right tibial vein: Secondary | ICD-10-CM | POA: Diagnosis not present

## 2021-03-20 DIAGNOSIS — Z7901 Long term (current) use of anticoagulants: Secondary | ICD-10-CM | POA: Diagnosis not present

## 2021-03-20 DIAGNOSIS — E11621 Type 2 diabetes mellitus with foot ulcer: Secondary | ICD-10-CM | POA: Diagnosis not present

## 2021-03-20 DIAGNOSIS — L97429 Non-pressure chronic ulcer of left heel and midfoot with unspecified severity: Secondary | ICD-10-CM | POA: Diagnosis not present

## 2021-03-20 DIAGNOSIS — Z79899 Other long term (current) drug therapy: Secondary | ICD-10-CM | POA: Diagnosis not present

## 2021-03-20 DIAGNOSIS — Z9109 Other allergy status, other than to drugs and biological substances: Secondary | ICD-10-CM | POA: Diagnosis not present

## 2021-03-20 DIAGNOSIS — Z886 Allergy status to analgesic agent status: Secondary | ICD-10-CM | POA: Diagnosis not present

## 2021-03-21 DIAGNOSIS — E1165 Type 2 diabetes mellitus with hyperglycemia: Secondary | ICD-10-CM | POA: Diagnosis not present

## 2021-03-21 DIAGNOSIS — L89612 Pressure ulcer of right heel, stage 2: Secondary | ICD-10-CM | POA: Diagnosis not present

## 2021-03-21 DIAGNOSIS — L89622 Pressure ulcer of left heel, stage 2: Secondary | ICD-10-CM | POA: Diagnosis not present

## 2021-03-21 DIAGNOSIS — H409 Unspecified glaucoma: Secondary | ICD-10-CM | POA: Diagnosis not present

## 2021-03-21 DIAGNOSIS — E1144 Type 2 diabetes mellitus with diabetic amyotrophy: Secondary | ICD-10-CM | POA: Diagnosis not present

## 2021-03-21 DIAGNOSIS — E1139 Type 2 diabetes mellitus with other diabetic ophthalmic complication: Secondary | ICD-10-CM | POA: Diagnosis not present

## 2021-03-23 ENCOUNTER — Encounter: Payer: Self-pay | Admitting: Registered Nurse

## 2021-03-23 ENCOUNTER — Other Ambulatory Visit: Payer: Self-pay

## 2021-03-23 ENCOUNTER — Encounter: Payer: Worker's Compensation | Attending: Psychology | Admitting: Registered Nurse

## 2021-03-23 VITALS — BP 122/72 | HR 90 | Temp 98.6°F | Wt 159.0 lb

## 2021-03-23 DIAGNOSIS — E1165 Type 2 diabetes mellitus with hyperglycemia: Secondary | ICD-10-CM | POA: Diagnosis not present

## 2021-03-23 DIAGNOSIS — G6181 Chronic inflammatory demyelinating polyneuritis: Secondary | ICD-10-CM | POA: Diagnosis present

## 2021-03-23 DIAGNOSIS — E1144 Type 2 diabetes mellitus with diabetic amyotrophy: Secondary | ICD-10-CM | POA: Diagnosis not present

## 2021-03-23 DIAGNOSIS — G894 Chronic pain syndrome: Secondary | ICD-10-CM | POA: Insufficient documentation

## 2021-03-23 DIAGNOSIS — G43711 Chronic migraine without aura, intractable, with status migrainosus: Secondary | ICD-10-CM | POA: Insufficient documentation

## 2021-03-23 DIAGNOSIS — Z5181 Encounter for therapeutic drug level monitoring: Secondary | ICD-10-CM | POA: Insufficient documentation

## 2021-03-23 DIAGNOSIS — M47812 Spondylosis without myelopathy or radiculopathy, cervical region: Secondary | ICD-10-CM | POA: Insufficient documentation

## 2021-03-23 DIAGNOSIS — L89612 Pressure ulcer of right heel, stage 2: Secondary | ICD-10-CM | POA: Diagnosis not present

## 2021-03-23 DIAGNOSIS — L89622 Pressure ulcer of left heel, stage 2: Secondary | ICD-10-CM | POA: Diagnosis not present

## 2021-03-23 DIAGNOSIS — H409 Unspecified glaucoma: Secondary | ICD-10-CM | POA: Diagnosis not present

## 2021-03-23 DIAGNOSIS — Z79891 Long term (current) use of opiate analgesic: Secondary | ICD-10-CM | POA: Diagnosis not present

## 2021-03-23 DIAGNOSIS — E1139 Type 2 diabetes mellitus with other diabetic ophthalmic complication: Secondary | ICD-10-CM | POA: Diagnosis not present

## 2021-03-23 NOTE — Progress Notes (Signed)
Subjective:    Patient ID: Monica [name], female    DOB: 1950-10-08, 70 y.o.   MRN: [id number]  HPI: Monica [name] is a 70 y.o. female who returns for follow up appointment for chronic pain and medication refill. She states her pain is located in her bilateral lower extremities and bilateral feet. Also reports left hand pain and  she has a chronic migraine. She rates her pain 4. Her current exercise regime is  performing stretching exercises.  [name] Morphine equivalent is 90.00 MME.   Oral Swab was Performed today.   [name] in room all questions answered.     Pain Inventory Average Pain 7 Pain Right Now 4 My pain is constant, burning, and tingling  In the last 24 hours, has pain interfered with the following? General activity 8 Relation with others 7 Enjoyment of life 8 What TIME of day is your pain at its worst? evening and night Sleep (in general) Poor  Pain is worse with: bending and some activites Pain improves with: heat/ice and medication Relief from Meds: 7  Family History  Problem Relation Age of Onset   Colon cancer Mother 91   Heart disease Father    Diabetes Father    Diabetes Paternal Grandmother    Heart disease Paternal Grandfather    Diverticulitis Daughter    Social History   Socioeconomic History   Marital status: Married    Spouse name: Not on file   Number of children: 3   Years of education: Not on file   Highest education level: Not on file  Occupational History   Occupation: disabled    Employer: UNEMPLOYED  Tobacco Use   Smoking status: Never   Smokeless tobacco: Never  Vaping Use   Vaping Use: Never used  Substance and Sexual Activity   Alcohol use: No   Drug use: No   Sexual activity: Yes    Birth control/protection: Post-menopausal  Other Topics Concern   Not on file  Social History Narrative   Lives at home with husband - who is full time caretaker - She has Chronic Inflammatory Demyelinating Polyradiculoneuropathy  per her husband - Making her mobility very limited   Right handed   Caffeine: none   Social Determinants of Corporate investment banker Strain: Low Risk    Difficulty of Paying Living Expenses: Not hard at all  Food Insecurity: No Food Insecurity   Worried About Programme researcher, broadcasting/film/video in the Last Year: Never true   Ran Out of Food in the Last Year: Never true  Transportation Needs: No Transportation Needs   Lack of Transportation (Medical): No   Lack of Transportation (Non-Medical): No  Physical Activity: Insufficiently Active   Days of Exercise per Week: 2 days   Minutes of Exercise per Session: 30 min  Stress: No Stress Concern Present   Feeling of Stress : Only a little  Social Connections: Moderately Integrated   Frequency of Communication with Friends and Family: Twice a week   Frequency of Social Gatherings with Friends and Family: Twice a week   Attends Religious Services: Never   Database administrator or Organizations: Yes   Attends Banker Meetings: 1 to 4 times per year   Marital Status: Married   Past Surgical History:  Procedure Laterality Date   BREAST LUMPECTOMY     left   CATARACT EXTRACTION W/PHACO Left 10/07/2016   Procedure: CATARACT EXTRACTION PHACO AND INTRAOCULAR LENS PLACEMENT (IOC);  Surgeon: Gemma Payor, MD;  Location: [facility];  Service: Ophthalmology;  Laterality: Left;  CDE: 4.98   CATARACT EXTRACTION W/PHACO Right 10/21/2016   Procedure: CATARACT EXTRACTION PHACO AND INTRAOCULAR LENS PLACEMENT RIGHT EYE CDE - 6.24;  Surgeon: Gemma Payor, MD;  Location: [facility];  Service: Ophthalmology;  Laterality: Right;  right   CHOLECYSTECTOMY     COLONOSCOPY  11/17/08   ext hemorrhoids   CRANIOTOMY     secondary TBI   CYST EXCISION     left wrist   ESOPHAGOGASTRODUODENOSCOPY  04/05/10   Monica ring (79F),erosive reflux esophagitis, small hiatal hernia,   ESOPHAGOGASTRODUODENOSCOPY (EGD) WITH PROPOFOL N/A 08/30/2019   Procedure:  ESOPHAGOGASTRODUODENOSCOPY (EGD) WITH PROPOFOL;  Surgeon: Corbin Ade, MD;  Location: [facility];  Service: Endoscopy;  Laterality: N/A;  3:30pm - pt knows to arrive at 9:45   EXTRACORPOREAL SHOCK WAVE LITHOTRIPSY Left 12/13/2019   Procedure: EXTRACORPOREAL SHOCK WAVE LITHOTRIPSY (ESWL);  Surgeon: Jerilee Field, MD;  Location[facility];  Service: Urology;  Laterality: Left;   FINGER SURGERY     removal cyst left pinky   gastro empy study  03/03/10   normal   HAND SURGERY     MALONEY DILATION N/A 08/30/2019   Procedure: Elease Hashimoto DILATION;  Surgeon: Corbin Ade, MD;  Location: [facility];  Service: Endoscopy;  Laterality: N/A;   NECK SURGERY  1998   NEPHROLITHOTOMY  05/13/2012   Procedure: NEPHROLITHOTOMY PERCUTANEOUS;  Surgeon: Marcine Matar, MD;  Location: [facility];  Service: Urology;  Laterality: Left;      SURAL NERVE BX Right 02/28/2020   Procedure: SURAL NERVE / Quadricep BIOPSY;  Surgeon: Tia Alert, MD;  Location: [facility] OR;  Service: Neurosurgery;  Laterality: Right;   Past Surgical History:  Procedure Laterality Date   BREAST LUMPECTOMY     left   CATARACT EXTRACTION W/PHACO Left 10/07/2016   Procedure: CATARACT EXTRACTION PHACO AND INTRAOCULAR LENS PLACEMENT (IOC);  Surgeon: Gemma Payor, MD;  Location: [facility];  Service: Ophthalmology;  Laterality: Left;  CDE: 4.98   CATARACT EXTRACTION W/PHACO Right 10/21/2016   Procedure: CATARACT EXTRACTION PHACO AND INTRAOCULAR LENS PLACEMENT RIGHT EYE CDE - 6.24;  Surgeon: Gemma Payor, MD;  Location: [facility];  Service: Ophthalmology;  Laterality: Right;  right   CHOLECYSTECTOMY     COLONOSCOPY  11/17/08   ext hemorrhoids   CRANIOTOMY     secondary TBI   CYST EXCISION     left wrist   ESOPHAGOGASTRODUODENOSCOPY  04/05/10   Monica ring (79F),erosive reflux esophagitis, small hiatal hernia,   ESOPHAGOGASTRODUODENOSCOPY (EGD) WITH PROPOFOL N/A 08/30/2019   Procedure: ESOPHAGOGASTRODUODENOSCOPY (EGD) WITH  PROPOFOL;  Surgeon: Corbin Ade, MD;  [facility];  Service: Endoscopy;  Laterality: N/A;  3:30pm - pt knows to arrive at 9:45   EXTRACORPOREAL SHOCK WAVE LITHOTRIPSY Left 12/13/2019   Procedure: EXTRACORPOREAL SHOCK WAVE LITHOTRIPSY (ESWL);  Surgeon: Jerilee Field, MD;  Location: [facility];  Service: Urology;  Laterality: Left;   FINGER SURGERY     removal cyst left pinky   gastro empy study  03/03/10   normal   HAND SURGERY     MALONEY DILATION N/A 08/30/2019   Procedure: Elease Hashimoto DILATION;  Surgeon: Corbin Ade, MD;  [facility];  Service: Endoscopy;  Laterality: N/A;   NECK SURGERY  1998   NEPHROLITHOTOMY  05/13/2012   Procedure: NEPHROLITHOTOMY PERCUTANEOUS;  Surgeon: Marcine Matar, MD;  Location: [facility];  Service: Urology;  Laterality: Left;      SURAL NERVE BX Right 02/28/2020   Procedure: SURAL NERVE / Quadricep BIOPSY;  Surgeon: Tia AlertJones, David S, MD;  [facility];  Service: Neurosurgery;  Laterality: Right;   Past Medical History:  Diagnosis Date   Arthritis    Cervical facet joint syndrome 04/22/2012   Cervicalgia    Chronic headaches    Dr Hermelinda MedicusSchwartz [place] [facility]   Chronic nausea    normal GES    Complication of anesthesia    had problem with local anesthesia-tried to assist Physician   DM (diabetes mellitus) (HCC)    Elevated liver enzymes    hx of, normal 03/2010   GERD (gastroesophageal reflux disease) 05/31/2011   History Schatzki's ring, erosive reflux esophagitis, status post EGD and dilation 8/11 and 11/20   Glaucoma    lens in both eyes   Hemorrhoids 11/17/08   Colonoscopy Dr Karilyn Cotaehman   Occipital neuralgia    PTSD (post-traumatic stress disorder)    Renal lithiasis    Seizure (HCC)    12 yrs ago-med related   Torticollis    Unspecified musculoskeletal disorders and symptoms referable to neck    cervical/trapezius   BP 122/72   Pulse 90   Temp 98.6 F (37 C)   Wt 159 lb (72.1 kg)   SpO2 93%   BMI  27.29 kg/m   Opioid Risk Score:   Fall Risk Score:  `1  Depression screen PHQ 2/9  Depression screen [facility] 03/23/2021 02/21/2021 01/03/2021 12/01/2020 11/29/2020 08/02/2020 05/04/2020  Decreased Interest 1 0 1 1 1 1  0  Down, Depressed, Hopeless 1 1 1 1 1 1  0  PHQ - 2 Score 2 1 2 2 2 2  0  Altered sleeping - 1 1 2  - - -  Tired, decreased energy - 0 1 1 - - -  Change in appetite - 0 1 1 - - -  Feeling bad or failure about yourself  - 0 0 1 - - -  Trouble concentrating - 0 2 2 - - -  Moving slowly or fidgety/restless - 1 2 2  - - -  Suicidal thoughts - 0 0 0 - - -  PHQ-9 Score - 3 9 11  - - -  Difficult doing work/chores - Not difficult at all Somewhat difficult Somewhat difficult - - -  Some recent data might be hidden     Review of Systems  Constitutional: Negative.   HENT: Negative.    Eyes: Negative.   Respiratory: Negative.    Cardiovascular: Negative.   Gastrointestinal: Negative.   Endocrine: Negative.   Genitourinary: Negative.   Musculoskeletal:  Positive for arthralgias, back pain, myalgias and neck pain.  Skin: Negative.   Allergic/Immunologic: Negative.   Neurological:        Tingling  Hematological:  Bruises/bleeds easily.       Eliquis  Psychiatric/Behavioral:  Positive for dysphoric mood.   All other systems reviewed and are negative.     Objective:   Physical Exam Vitals and nursing note reviewed.  Constitutional:      Appearance: Normal appearance.  Cardiovascular:     Rate and Rhythm: Normal rate and regular rhythm.     Pulses: Normal pulses.     Heart sounds: Normal heart sounds.  Pulmonary:     Effort: Pulmonary effort is normal.     Breath sounds: Normal breath sounds.  Musculoskeletal:     Cervical back: Normal range of motion and neck supple.  Comments: Normal Muscle Bulk and Muscle Testing Reveals:  Upper Extremities: Full ROM and Muscle Strength 5/5   Lower Extremities: Full ROM and Muscle Strength 4/5 Arrived in wheelchair        Skin:    General: Skin is warm and dry.  Neurological:     Mental Status: She is alert and oriented to person, place, and time.  Psychiatric:        Mood and Affect: Mood normal.        Behavior: Behavior normal.         Assessment & Plan:  1. History of cervicalgia, facet arthropathy, and chronic daily, intractable migraine \ headaches.   Continue with slow weaning of Methadone. She is currently Taking Methadone 12-14-08, tolerating the slow weaning. Next Month we decreased to Methadone 5 mg one tablet three times a day. No script given today. Prescription at the pharmacy and they will pick it up today.   We will continue the opioid monitoring program, this consists of regular clinic visits, examinations, urine drug screen, pill counts as well as use of [place] Controlled Substance Reporting system. A 12 month History has been reviewed on the [place] Controlled Substance Reporting System Today.   2. 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  Continue Gabapentin. Continue to Monitor.  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 Continue to monitor.   F/U in 2 months with Dr [name]

## 2021-03-27 DIAGNOSIS — L89612 Pressure ulcer of right heel, stage 2: Secondary | ICD-10-CM | POA: Diagnosis not present

## 2021-03-27 DIAGNOSIS — E1139 Type 2 diabetes mellitus with other diabetic ophthalmic complication: Secondary | ICD-10-CM | POA: Diagnosis not present

## 2021-03-27 DIAGNOSIS — H409 Unspecified glaucoma: Secondary | ICD-10-CM | POA: Diagnosis not present

## 2021-03-27 DIAGNOSIS — E1165 Type 2 diabetes mellitus with hyperglycemia: Secondary | ICD-10-CM | POA: Diagnosis not present

## 2021-03-27 DIAGNOSIS — L89622 Pressure ulcer of left heel, stage 2: Secondary | ICD-10-CM | POA: Diagnosis not present

## 2021-03-27 DIAGNOSIS — E1144 Type 2 diabetes mellitus with diabetic amyotrophy: Secondary | ICD-10-CM | POA: Diagnosis not present

## 2021-03-28 DIAGNOSIS — E1165 Type 2 diabetes mellitus with hyperglycemia: Secondary | ICD-10-CM | POA: Diagnosis not present

## 2021-03-28 DIAGNOSIS — E1144 Type 2 diabetes mellitus with diabetic amyotrophy: Secondary | ICD-10-CM | POA: Diagnosis not present

## 2021-03-28 DIAGNOSIS — H409 Unspecified glaucoma: Secondary | ICD-10-CM | POA: Diagnosis not present

## 2021-03-28 DIAGNOSIS — L89612 Pressure ulcer of right heel, stage 2: Secondary | ICD-10-CM | POA: Diagnosis not present

## 2021-03-28 DIAGNOSIS — E1139 Type 2 diabetes mellitus with other diabetic ophthalmic complication: Secondary | ICD-10-CM | POA: Diagnosis not present

## 2021-03-28 DIAGNOSIS — L89622 Pressure ulcer of left heel, stage 2: Secondary | ICD-10-CM | POA: Diagnosis not present

## 2021-03-29 ENCOUNTER — Ambulatory Visit: Payer: PRIVATE HEALTH INSURANCE | Admitting: Psychology

## 2021-03-29 DIAGNOSIS — M542 Cervicalgia: Secondary | ICD-10-CM | POA: Diagnosis not present

## 2021-03-29 DIAGNOSIS — L89612 Pressure ulcer of right heel, stage 2: Secondary | ICD-10-CM | POA: Diagnosis not present

## 2021-03-29 DIAGNOSIS — E1165 Type 2 diabetes mellitus with hyperglycemia: Secondary | ICD-10-CM | POA: Diagnosis not present

## 2021-03-29 DIAGNOSIS — L89622 Pressure ulcer of left heel, stage 2: Secondary | ICD-10-CM | POA: Diagnosis not present

## 2021-03-29 DIAGNOSIS — E1139 Type 2 diabetes mellitus with other diabetic ophthalmic complication: Secondary | ICD-10-CM | POA: Diagnosis not present

## 2021-03-29 DIAGNOSIS — H409 Unspecified glaucoma: Secondary | ICD-10-CM | POA: Diagnosis not present

## 2021-03-29 DIAGNOSIS — Z7984 Long term (current) use of oral hypoglycemic drugs: Secondary | ICD-10-CM | POA: Diagnosis not present

## 2021-03-29 DIAGNOSIS — Z794 Long term (current) use of insulin: Secondary | ICD-10-CM | POA: Diagnosis not present

## 2021-03-29 DIAGNOSIS — E1144 Type 2 diabetes mellitus with diabetic amyotrophy: Secondary | ICD-10-CM | POA: Diagnosis not present

## 2021-03-30 DIAGNOSIS — H409 Unspecified glaucoma: Secondary | ICD-10-CM | POA: Diagnosis not present

## 2021-03-30 DIAGNOSIS — E1144 Type 2 diabetes mellitus with diabetic amyotrophy: Secondary | ICD-10-CM | POA: Diagnosis not present

## 2021-03-30 DIAGNOSIS — E1139 Type 2 diabetes mellitus with other diabetic ophthalmic complication: Secondary | ICD-10-CM | POA: Diagnosis not present

## 2021-03-30 DIAGNOSIS — L89612 Pressure ulcer of right heel, stage 2: Secondary | ICD-10-CM | POA: Diagnosis not present

## 2021-03-30 DIAGNOSIS — L89622 Pressure ulcer of left heel, stage 2: Secondary | ICD-10-CM | POA: Diagnosis not present

## 2021-03-30 DIAGNOSIS — E1165 Type 2 diabetes mellitus with hyperglycemia: Secondary | ICD-10-CM | POA: Diagnosis not present

## 2021-03-30 LAB — DRUG TOX MONITOR 1 W/CONF, ORAL FLD
Amphetamines: NEGATIVE ng/mL (ref <10)
Barbiturates: NEGATIVE ng/mL (ref <10)
Benzodiazepines: NEGATIVE ng/mL (ref <0.50)
Buprenorphine: NEGATIVE ng/mL (ref <0.10)
Cocaine: NEGATIVE ng/mL (ref <5.0)
EDDP: NEGATIVE ng/mL (ref <5.0)
Fentanyl: NEGATIVE ng/mL (ref <0.10)
Heroin Metabolite: NEGATIVE ng/mL (ref <1.0)
MARIJUANA: NEGATIVE ng/mL (ref <2.5)
MDMA: NEGATIVE ng/mL (ref <10)
Meprobamate: NEGATIVE ng/mL (ref <2.5)
Methadone: 7.2 ng/mL — ABNORMAL HIGH (ref <5.0)
Methadone: POSITIVE ng/mL — AB (ref <5.0)
Nicotine Metabolite: NEGATIVE ng/mL (ref <5.0)
Opiates: NEGATIVE ng/mL (ref <2.5)
Phencyclidine: NEGATIVE ng/mL (ref <10)
Tapentadol: NEGATIVE ng/mL (ref <5.0)
Tramadol: NEGATIVE ng/mL (ref <5.0)
Zolpidem: NEGATIVE ng/mL (ref <5.0)

## 2021-03-30 LAB — DRUG TOX ALC METAB W/CON, ORAL FLD: Alcohol Metabolite: NEGATIVE ng/mL (ref <25)

## 2021-04-02 ENCOUNTER — Telehealth: Payer: Self-pay | Admitting: *Deleted

## 2021-04-02 NOTE — Telephone Encounter (Signed)
Oral swab drug screen was consistent for prescribed medications. Tapentadol is taken rarely.

## 2021-04-03 DIAGNOSIS — L89612 Pressure ulcer of right heel, stage 2: Secondary | ICD-10-CM | POA: Diagnosis not present

## 2021-04-03 DIAGNOSIS — H409 Unspecified glaucoma: Secondary | ICD-10-CM | POA: Diagnosis not present

## 2021-04-03 DIAGNOSIS — L89622 Pressure ulcer of left heel, stage 2: Secondary | ICD-10-CM | POA: Diagnosis not present

## 2021-04-03 DIAGNOSIS — E1165 Type 2 diabetes mellitus with hyperglycemia: Secondary | ICD-10-CM | POA: Diagnosis not present

## 2021-04-03 DIAGNOSIS — E1144 Type 2 diabetes mellitus with diabetic amyotrophy: Secondary | ICD-10-CM | POA: Diagnosis not present

## 2021-04-03 DIAGNOSIS — E1139 Type 2 diabetes mellitus with other diabetic ophthalmic complication: Secondary | ICD-10-CM | POA: Diagnosis not present

## 2021-04-05 ENCOUNTER — Ambulatory Visit: Payer: Medicare Other

## 2021-04-06 DIAGNOSIS — E1139 Type 2 diabetes mellitus with other diabetic ophthalmic complication: Secondary | ICD-10-CM | POA: Diagnosis not present

## 2021-04-06 DIAGNOSIS — L89612 Pressure ulcer of right heel, stage 2: Secondary | ICD-10-CM | POA: Diagnosis not present

## 2021-04-06 DIAGNOSIS — E1165 Type 2 diabetes mellitus with hyperglycemia: Secondary | ICD-10-CM | POA: Diagnosis not present

## 2021-04-06 DIAGNOSIS — H409 Unspecified glaucoma: Secondary | ICD-10-CM | POA: Diagnosis not present

## 2021-04-06 DIAGNOSIS — L89622 Pressure ulcer of left heel, stage 2: Secondary | ICD-10-CM | POA: Diagnosis not present

## 2021-04-06 DIAGNOSIS — E1144 Type 2 diabetes mellitus with diabetic amyotrophy: Secondary | ICD-10-CM | POA: Diagnosis not present

## 2021-04-09 ENCOUNTER — Other Ambulatory Visit: Payer: Self-pay | Admitting: Family Medicine

## 2021-04-10 ENCOUNTER — Telehealth: Payer: Self-pay | Admitting: Pharmacist

## 2021-04-10 ENCOUNTER — Telehealth: Payer: Medicare Other

## 2021-04-10 DIAGNOSIS — H409 Unspecified glaucoma: Secondary | ICD-10-CM | POA: Diagnosis not present

## 2021-04-10 DIAGNOSIS — E1139 Type 2 diabetes mellitus with other diabetic ophthalmic complication: Secondary | ICD-10-CM | POA: Diagnosis not present

## 2021-04-10 DIAGNOSIS — E1144 Type 2 diabetes mellitus with diabetic amyotrophy: Secondary | ICD-10-CM | POA: Diagnosis not present

## 2021-04-10 DIAGNOSIS — L89612 Pressure ulcer of right heel, stage 2: Secondary | ICD-10-CM | POA: Diagnosis not present

## 2021-04-10 DIAGNOSIS — E1165 Type 2 diabetes mellitus with hyperglycemia: Secondary | ICD-10-CM | POA: Diagnosis not present

## 2021-04-10 DIAGNOSIS — L89622 Pressure ulcer of left heel, stage 2: Secondary | ICD-10-CM | POA: Diagnosis not present

## 2021-04-10 NOTE — Telephone Encounter (Signed)
Just FYI: Spoke with patient and spouse/caregiver She stated she was going to bathroom just fine on her own Denied needing assistance for Linzess (& other medications in this category) pharmD appt cancelled

## 2021-04-11 DIAGNOSIS — L89612 Pressure ulcer of right heel, stage 2: Secondary | ICD-10-CM | POA: Diagnosis not present

## 2021-04-11 DIAGNOSIS — H409 Unspecified glaucoma: Secondary | ICD-10-CM | POA: Diagnosis not present

## 2021-04-11 DIAGNOSIS — E1144 Type 2 diabetes mellitus with diabetic amyotrophy: Secondary | ICD-10-CM | POA: Diagnosis not present

## 2021-04-11 DIAGNOSIS — E1139 Type 2 diabetes mellitus with other diabetic ophthalmic complication: Secondary | ICD-10-CM | POA: Diagnosis not present

## 2021-04-11 DIAGNOSIS — L89622 Pressure ulcer of left heel, stage 2: Secondary | ICD-10-CM | POA: Diagnosis not present

## 2021-04-11 DIAGNOSIS — E1165 Type 2 diabetes mellitus with hyperglycemia: Secondary | ICD-10-CM | POA: Diagnosis not present

## 2021-04-12 ENCOUNTER — Other Ambulatory Visit: Payer: Self-pay

## 2021-04-12 ENCOUNTER — Ambulatory Visit (INDEPENDENT_AMBULATORY_CARE_PROVIDER_SITE_OTHER): Payer: Medicare Other

## 2021-04-12 DIAGNOSIS — E1139 Type 2 diabetes mellitus with other diabetic ophthalmic complication: Secondary | ICD-10-CM | POA: Diagnosis not present

## 2021-04-12 DIAGNOSIS — Z7984 Long term (current) use of oral hypoglycemic drugs: Secondary | ICD-10-CM | POA: Diagnosis not present

## 2021-04-12 DIAGNOSIS — E1144 Type 2 diabetes mellitus with diabetic amyotrophy: Secondary | ICD-10-CM

## 2021-04-12 DIAGNOSIS — M542 Cervicalgia: Secondary | ICD-10-CM | POA: Diagnosis not present

## 2021-04-12 DIAGNOSIS — L89622 Pressure ulcer of left heel, stage 2: Secondary | ICD-10-CM

## 2021-04-12 DIAGNOSIS — Z794 Long term (current) use of insulin: Secondary | ICD-10-CM | POA: Diagnosis not present

## 2021-04-12 DIAGNOSIS — L89612 Pressure ulcer of right heel, stage 2: Secondary | ICD-10-CM

## 2021-04-12 DIAGNOSIS — E1165 Type 2 diabetes mellitus with hyperglycemia: Secondary | ICD-10-CM

## 2021-04-12 DIAGNOSIS — H409 Unspecified glaucoma: Secondary | ICD-10-CM | POA: Diagnosis not present

## 2021-04-13 ENCOUNTER — Other Ambulatory Visit: Payer: Self-pay | Admitting: Physical Medicine & Rehabilitation

## 2021-04-13 ENCOUNTER — Other Ambulatory Visit: Payer: Self-pay | Admitting: Family Medicine

## 2021-04-13 DIAGNOSIS — E1142 Type 2 diabetes mellitus with diabetic polyneuropathy: Secondary | ICD-10-CM

## 2021-04-13 DIAGNOSIS — E1144 Type 2 diabetes mellitus with diabetic amyotrophy: Secondary | ICD-10-CM

## 2021-04-13 DIAGNOSIS — Z794 Long term (current) use of insulin: Secondary | ICD-10-CM

## 2021-04-13 DIAGNOSIS — E1139 Type 2 diabetes mellitus with other diabetic ophthalmic complication: Secondary | ICD-10-CM | POA: Diagnosis not present

## 2021-04-13 DIAGNOSIS — L89612 Pressure ulcer of right heel, stage 2: Secondary | ICD-10-CM | POA: Diagnosis not present

## 2021-04-13 DIAGNOSIS — H409 Unspecified glaucoma: Secondary | ICD-10-CM | POA: Diagnosis not present

## 2021-04-13 DIAGNOSIS — L89622 Pressure ulcer of left heel, stage 2: Secondary | ICD-10-CM | POA: Diagnosis not present

## 2021-04-13 DIAGNOSIS — E1165 Type 2 diabetes mellitus with hyperglycemia: Secondary | ICD-10-CM | POA: Diagnosis not present

## 2021-04-27 ENCOUNTER — Other Ambulatory Visit (HOSPITAL_COMMUNITY): Payer: Self-pay

## 2021-05-07 ENCOUNTER — Telehealth: Payer: Self-pay

## 2021-05-07 DIAGNOSIS — G4486 Cervicogenic headache: Secondary | ICD-10-CM

## 2021-05-07 DIAGNOSIS — E1144 Type 2 diabetes mellitus with diabetic amyotrophy: Secondary | ICD-10-CM

## 2021-05-07 DIAGNOSIS — G43711 Chronic migraine without aura, intractable, with status migrainosus: Secondary | ICD-10-CM

## 2021-05-07 MED ORDER — GABAPENTIN 600 MG PO TABS: ORAL_TABLET | ORAL | 0 refills | Status: DC

## 2021-05-07 NOTE — Telephone Encounter (Signed)
Gabapentin phoned in today.

## 2021-05-11 DIAGNOSIS — E1165 Type 2 diabetes mellitus with hyperglycemia: Secondary | ICD-10-CM | POA: Diagnosis not present

## 2021-05-17 ENCOUNTER — Telehealth: Payer: Self-pay

## 2021-05-17 DIAGNOSIS — G43711 Chronic migraine without aura, intractable, with status migrainosus: Secondary | ICD-10-CM

## 2021-05-17 DIAGNOSIS — G4486 Cervicogenic headache: Secondary | ICD-10-CM

## 2021-05-18 MED ORDER — METHADONE HCL 10 MG PO TABS: ORAL_TABLET | ORAL | 0 refills | Status: AC

## 2021-05-18 NOTE — Telephone Encounter (Signed)
Methadone refill (PMP report: last filled 03/16/21 for #[id number])

## 2021-05-18 NOTE — Telephone Encounter (Signed)
Rx sent 

## 2021-05-24 ENCOUNTER — Ambulatory Visit: Payer: Medicare Other | Admitting: Family Medicine

## 2021-05-28 ENCOUNTER — Telehealth: Payer: Self-pay | Admitting: Physical Medicine & Rehabilitation

## 2021-05-28 NOTE — Telephone Encounter (Signed)
[place]oved to [place] not [place] coming back to see you

## 2021-05-30 ENCOUNTER — Other Ambulatory Visit: Payer: Self-pay | Admitting: Physical Medicine & Rehabilitation

## 2021-05-30 ENCOUNTER — Encounter: Payer: Worker's Compensation | Admitting: Physical Medicine & Rehabilitation

## 2021-05-30 DIAGNOSIS — G43711 Chronic migraine without aura, intractable, with status migrainosus: Secondary | ICD-10-CM

## 2021-05-30 DIAGNOSIS — G4486 Cervicogenic headache: Secondary | ICD-10-CM

## 2021-05-30 DIAGNOSIS — E1144 Type 2 diabetes mellitus with diabetic amyotrophy: Secondary | ICD-10-CM

## 2021-06-11 DIAGNOSIS — G44309 Post-traumatic headache, unspecified, not intractable: Secondary | ICD-10-CM | POA: Diagnosis not present

## 2021-06-11 DIAGNOSIS — M21371 Foot drop, right foot: Secondary | ICD-10-CM | POA: Diagnosis not present

## 2021-06-11 DIAGNOSIS — R194 Change in bowel habit: Secondary | ICD-10-CM | POA: Diagnosis not present

## 2021-06-11 DIAGNOSIS — G6181 Chronic inflammatory demyelinating polyneuritis: Secondary | ICD-10-CM | POA: Diagnosis not present

## 2021-06-11 DIAGNOSIS — M21372 Foot drop, left foot: Secondary | ICD-10-CM | POA: Diagnosis not present

## 2021-06-11 DIAGNOSIS — G40909 Epilepsy, unspecified, not intractable, without status epilepticus: Secondary | ICD-10-CM | POA: Diagnosis not present

## 2021-06-11 DIAGNOSIS — R600 Localized edema: Secondary | ICD-10-CM | POA: Diagnosis not present

## 2021-06-11 DIAGNOSIS — S91302D Unspecified open wound, left foot, subsequent encounter: Secondary | ICD-10-CM | POA: Diagnosis not present

## 2021-06-11 DIAGNOSIS — S91301D Unspecified open wound, right foot, subsequent encounter: Secondary | ICD-10-CM | POA: Diagnosis not present

## 2021-06-11 DIAGNOSIS — E1165 Type 2 diabetes mellitus with hyperglycemia: Secondary | ICD-10-CM | POA: Diagnosis not present

## 2021-06-18 ENCOUNTER — Other Ambulatory Visit: Payer: Self-pay | Admitting: Physical Medicine & Rehabilitation

## 2021-06-19 ENCOUNTER — Other Ambulatory Visit: Payer: Self-pay | Admitting: Physical Medicine & Rehabilitation

## 2021-06-19 DIAGNOSIS — E1144 Type 2 diabetes mellitus with diabetic amyotrophy: Secondary | ICD-10-CM

## 2021-06-20 DIAGNOSIS — M21372 Foot drop, left foot: Secondary | ICD-10-CM | POA: Diagnosis not present

## 2021-06-20 DIAGNOSIS — Z79891 Long term (current) use of opiate analgesic: Secondary | ICD-10-CM | POA: Diagnosis not present

## 2021-06-20 DIAGNOSIS — Z993 Dependence on wheelchair: Secondary | ICD-10-CM | POA: Diagnosis not present

## 2021-06-20 DIAGNOSIS — G6181 Chronic inflammatory demyelinating polyneuritis: Secondary | ICD-10-CM | POA: Diagnosis not present

## 2021-06-20 DIAGNOSIS — M21371 Foot drop, right foot: Secondary | ICD-10-CM | POA: Diagnosis not present

## 2021-06-20 DIAGNOSIS — G8929 Other chronic pain: Secondary | ICD-10-CM | POA: Diagnosis not present

## 2021-06-20 DIAGNOSIS — M961 Postlaminectomy syndrome, not elsewhere classified: Secondary | ICD-10-CM | POA: Diagnosis not present

## 2021-06-21 DIAGNOSIS — E119 Type 2 diabetes mellitus without complications: Secondary | ICD-10-CM | POA: Diagnosis not present

## 2021-06-21 DIAGNOSIS — Z7901 Long term (current) use of anticoagulants: Secondary | ICD-10-CM | POA: Diagnosis not present

## 2021-06-21 DIAGNOSIS — Z886 Allergy status to analgesic agent status: Secondary | ICD-10-CM | POA: Diagnosis not present

## 2021-06-21 DIAGNOSIS — Z79899 Other long term (current) drug therapy: Secondary | ICD-10-CM | POA: Diagnosis not present

## 2021-06-21 DIAGNOSIS — G6181 Chronic inflammatory demyelinating polyneuritis: Secondary | ICD-10-CM | POA: Diagnosis not present

## 2021-06-21 DIAGNOSIS — I2510 Atherosclerotic heart disease of native coronary artery without angina pectoris: Secondary | ICD-10-CM | POA: Diagnosis not present

## 2021-06-21 DIAGNOSIS — L89613 Pressure ulcer of right heel, stage 3: Secondary | ICD-10-CM | POA: Diagnosis not present

## 2021-06-21 DIAGNOSIS — Z794 Long term (current) use of insulin: Secondary | ICD-10-CM | POA: Diagnosis not present

## 2021-06-21 DIAGNOSIS — E11621 Type 2 diabetes mellitus with foot ulcer: Secondary | ICD-10-CM | POA: Diagnosis not present

## 2021-06-21 DIAGNOSIS — I82409 Acute embolism and thrombosis of unspecified deep veins of unspecified lower extremity: Secondary | ICD-10-CM | POA: Diagnosis not present

## 2021-06-21 DIAGNOSIS — L89623 Pressure ulcer of left heel, stage 3: Secondary | ICD-10-CM | POA: Diagnosis not present

## 2021-06-21 DIAGNOSIS — E11622 Type 2 diabetes mellitus with other skin ulcer: Secondary | ICD-10-CM | POA: Diagnosis not present

## 2021-06-27 DIAGNOSIS — E11622 Type 2 diabetes mellitus with other skin ulcer: Secondary | ICD-10-CM | POA: Diagnosis not present

## 2021-06-27 DIAGNOSIS — L89623 Pressure ulcer of left heel, stage 3: Secondary | ICD-10-CM | POA: Diagnosis not present

## 2021-06-27 DIAGNOSIS — L89613 Pressure ulcer of right heel, stage 3: Secondary | ICD-10-CM | POA: Diagnosis not present

## 2021-06-28 DIAGNOSIS — Z86718 Personal history of other venous thrombosis and embolism: Secondary | ICD-10-CM | POA: Diagnosis not present

## 2021-06-28 DIAGNOSIS — E11622 Type 2 diabetes mellitus with other skin ulcer: Secondary | ICD-10-CM | POA: Diagnosis not present

## 2021-06-28 DIAGNOSIS — E11621 Type 2 diabetes mellitus with foot ulcer: Secondary | ICD-10-CM | POA: Diagnosis not present

## 2021-06-28 DIAGNOSIS — L89623 Pressure ulcer of left heel, stage 3: Secondary | ICD-10-CM | POA: Diagnosis not present

## 2021-06-28 DIAGNOSIS — E1140 Type 2 diabetes mellitus with diabetic neuropathy, unspecified: Secondary | ICD-10-CM | POA: Diagnosis not present

## 2021-06-28 DIAGNOSIS — Z7984 Long term (current) use of oral hypoglycemic drugs: Secondary | ICD-10-CM | POA: Diagnosis not present

## 2021-06-28 DIAGNOSIS — L89613 Pressure ulcer of right heel, stage 3: Secondary | ICD-10-CM | POA: Diagnosis not present

## 2021-06-28 DIAGNOSIS — G6181 Chronic inflammatory demyelinating polyneuritis: Secondary | ICD-10-CM | POA: Diagnosis not present

## 2021-06-28 DIAGNOSIS — Z79899 Other long term (current) drug therapy: Secondary | ICD-10-CM | POA: Diagnosis not present

## 2021-06-28 DIAGNOSIS — Z794 Long term (current) use of insulin: Secondary | ICD-10-CM | POA: Diagnosis not present

## 2021-06-28 DIAGNOSIS — I2510 Atherosclerotic heart disease of native coronary artery without angina pectoris: Secondary | ICD-10-CM | POA: Diagnosis not present

## 2021-07-11 DIAGNOSIS — E1165 Type 2 diabetes mellitus with hyperglycemia: Secondary | ICD-10-CM | POA: Diagnosis not present

## 2021-07-20 DIAGNOSIS — Z1331 Encounter for screening for depression: Secondary | ICD-10-CM | POA: Diagnosis not present

## 2021-07-20 DIAGNOSIS — E1165 Type 2 diabetes mellitus with hyperglycemia: Secondary | ICD-10-CM | POA: Diagnosis not present

## 2021-07-20 DIAGNOSIS — S91301A Unspecified open wound, right foot, initial encounter: Secondary | ICD-10-CM | POA: Diagnosis not present

## 2021-07-20 DIAGNOSIS — M21372 Foot drop, left foot: Secondary | ICD-10-CM | POA: Diagnosis not present

## 2021-07-20 DIAGNOSIS — M961 Postlaminectomy syndrome, not elsewhere classified: Secondary | ICD-10-CM | POA: Diagnosis not present

## 2021-07-20 DIAGNOSIS — G44309 Post-traumatic headache, unspecified, not intractable: Secondary | ICD-10-CM | POA: Diagnosis not present

## 2021-07-20 DIAGNOSIS — Z79891 Long term (current) use of opiate analgesic: Secondary | ICD-10-CM | POA: Diagnosis not present

## 2021-07-20 DIAGNOSIS — G894 Chronic pain syndrome: Secondary | ICD-10-CM | POA: Diagnosis not present

## 2021-07-20 DIAGNOSIS — M21371 Foot drop, right foot: Secondary | ICD-10-CM | POA: Diagnosis not present

## 2021-07-20 DIAGNOSIS — G6181 Chronic inflammatory demyelinating polyneuritis: Secondary | ICD-10-CM | POA: Diagnosis not present

## 2021-07-20 DIAGNOSIS — M542 Cervicalgia: Secondary | ICD-10-CM | POA: Diagnosis not present

## 2021-07-20 DIAGNOSIS — Z993 Dependence on wheelchair: Secondary | ICD-10-CM | POA: Diagnosis not present

## 2021-07-20 DIAGNOSIS — E663 Overweight: Secondary | ICD-10-CM | POA: Diagnosis not present

## 2021-07-20 DIAGNOSIS — S91302A Unspecified open wound, left foot, initial encounter: Secondary | ICD-10-CM | POA: Diagnosis not present

## 2021-07-20 DIAGNOSIS — G40909 Epilepsy, unspecified, not intractable, without status epilepticus: Secondary | ICD-10-CM | POA: Diagnosis not present

## 2021-07-20 DIAGNOSIS — Z23 Encounter for immunization: Secondary | ICD-10-CM | POA: Diagnosis not present

## 2021-07-23 DIAGNOSIS — Z7984 Long term (current) use of oral hypoglycemic drugs: Secondary | ICD-10-CM | POA: Diagnosis not present

## 2021-07-23 DIAGNOSIS — Z7901 Long term (current) use of anticoagulants: Secondary | ICD-10-CM | POA: Diagnosis not present

## 2021-07-23 DIAGNOSIS — I82491 Acute embolism and thrombosis of other specified deep vein of right lower extremity: Secondary | ICD-10-CM | POA: Diagnosis not present

## 2021-07-23 DIAGNOSIS — G610 Guillain-Barre syndrome: Secondary | ICD-10-CM | POA: Diagnosis not present

## 2021-07-30 DIAGNOSIS — G6181 Chronic inflammatory demyelinating polyneuritis: Secondary | ICD-10-CM | POA: Diagnosis not present

## 2021-07-30 DIAGNOSIS — E11621 Type 2 diabetes mellitus with foot ulcer: Secondary | ICD-10-CM | POA: Diagnosis not present

## 2021-07-30 DIAGNOSIS — Z20822 Contact with and (suspected) exposure to covid-19: Secondary | ICD-10-CM | POA: Diagnosis not present

## 2021-07-30 DIAGNOSIS — L89613 Pressure ulcer of right heel, stage 3: Secondary | ICD-10-CM | POA: Diagnosis not present

## 2021-07-30 DIAGNOSIS — Z86718 Personal history of other venous thrombosis and embolism: Secondary | ICD-10-CM | POA: Diagnosis not present

## 2021-07-30 DIAGNOSIS — L89623 Pressure ulcer of left heel, stage 3: Secondary | ICD-10-CM | POA: Diagnosis not present

## 2021-07-30 DIAGNOSIS — Z7984 Long term (current) use of oral hypoglycemic drugs: Secondary | ICD-10-CM | POA: Diagnosis not present

## 2021-07-30 DIAGNOSIS — Z794 Long term (current) use of insulin: Secondary | ICD-10-CM | POA: Diagnosis not present

## 2021-07-30 DIAGNOSIS — I2510 Atherosclerotic heart disease of native coronary artery without angina pectoris: Secondary | ICD-10-CM | POA: Diagnosis not present

## 2021-07-30 DIAGNOSIS — Z79899 Other long term (current) drug therapy: Secondary | ICD-10-CM | POA: Diagnosis not present

## 2021-07-30 DIAGNOSIS — E11622 Type 2 diabetes mellitus with other skin ulcer: Secondary | ICD-10-CM | POA: Diagnosis not present

## 2021-08-02 DIAGNOSIS — G6181 Chronic inflammatory demyelinating polyneuritis: Secondary | ICD-10-CM | POA: Diagnosis not present

## 2021-08-02 DIAGNOSIS — E1140 Type 2 diabetes mellitus with diabetic neuropathy, unspecified: Secondary | ICD-10-CM | POA: Diagnosis not present

## 2021-08-02 DIAGNOSIS — G40009 Localization-related (focal) (partial) idiopathic epilepsy and epileptic syndromes with seizures of localized onset, not intractable, without status epilepticus: Secondary | ICD-10-CM | POA: Diagnosis not present

## 2021-08-11 DIAGNOSIS — E1165 Type 2 diabetes mellitus with hyperglycemia: Secondary | ICD-10-CM | POA: Diagnosis not present

## 2021-08-14 DIAGNOSIS — E1165 Type 2 diabetes mellitus with hyperglycemia: Secondary | ICD-10-CM | POA: Diagnosis not present

## 2021-08-17 DIAGNOSIS — G6181 Chronic inflammatory demyelinating polyneuritis: Secondary | ICD-10-CM | POA: Diagnosis not present

## 2021-08-17 DIAGNOSIS — G894 Chronic pain syndrome: Secondary | ICD-10-CM | POA: Diagnosis not present

## 2021-08-17 DIAGNOSIS — Z993 Dependence on wheelchair: Secondary | ICD-10-CM | POA: Diagnosis not present

## 2021-08-17 DIAGNOSIS — Z79891 Long term (current) use of opiate analgesic: Secondary | ICD-10-CM | POA: Diagnosis not present

## 2021-08-17 DIAGNOSIS — M21371 Foot drop, right foot: Secondary | ICD-10-CM | POA: Diagnosis not present

## 2021-08-17 DIAGNOSIS — M961 Postlaminectomy syndrome, not elsewhere classified: Secondary | ICD-10-CM | POA: Diagnosis not present

## 2021-08-17 DIAGNOSIS — M542 Cervicalgia: Secondary | ICD-10-CM | POA: Diagnosis not present

## 2021-08-17 DIAGNOSIS — M21372 Foot drop, left foot: Secondary | ICD-10-CM | POA: Diagnosis not present

## 2021-08-31 DIAGNOSIS — E1165 Type 2 diabetes mellitus with hyperglycemia: Secondary | ICD-10-CM | POA: Diagnosis not present

## 2021-08-31 DIAGNOSIS — G6181 Chronic inflammatory demyelinating polyneuritis: Secondary | ICD-10-CM | POA: Diagnosis not present

## 2021-08-31 DIAGNOSIS — G894 Chronic pain syndrome: Secondary | ICD-10-CM | POA: Diagnosis not present

## 2021-08-31 DIAGNOSIS — Z86718 Personal history of other venous thrombosis and embolism: Secondary | ICD-10-CM | POA: Diagnosis not present

## 2021-08-31 DIAGNOSIS — F320 Major depressive disorder, single episode, mild: Secondary | ICD-10-CM | POA: Diagnosis not present

## 2021-08-31 DIAGNOSIS — G40909 Epilepsy, unspecified, not intractable, without status epilepticus: Secondary | ICD-10-CM | POA: Diagnosis not present

## 2021-08-31 DIAGNOSIS — Z1331 Encounter for screening for depression: Secondary | ICD-10-CM | POA: Diagnosis not present

## 2021-09-11 DIAGNOSIS — Z760 Encounter for issue of repeat prescription: Secondary | ICD-10-CM | POA: Diagnosis not present

## 2021-09-11 DIAGNOSIS — E1165 Type 2 diabetes mellitus with hyperglycemia: Secondary | ICD-10-CM | POA: Diagnosis not present

## 2021-09-11 DIAGNOSIS — Z79899 Other long term (current) drug therapy: Secondary | ICD-10-CM | POA: Diagnosis not present

## 2021-09-14 DIAGNOSIS — M542 Cervicalgia: Secondary | ICD-10-CM | POA: Diagnosis not present

## 2021-09-14 DIAGNOSIS — M21372 Foot drop, left foot: Secondary | ICD-10-CM | POA: Diagnosis not present

## 2021-09-14 DIAGNOSIS — Z79891 Long term (current) use of opiate analgesic: Secondary | ICD-10-CM | POA: Diagnosis not present

## 2021-09-14 DIAGNOSIS — G894 Chronic pain syndrome: Secondary | ICD-10-CM | POA: Diagnosis not present

## 2021-09-14 DIAGNOSIS — M961 Postlaminectomy syndrome, not elsewhere classified: Secondary | ICD-10-CM | POA: Diagnosis not present

## 2021-09-14 DIAGNOSIS — G6181 Chronic inflammatory demyelinating polyneuritis: Secondary | ICD-10-CM | POA: Diagnosis not present

## 2021-09-14 DIAGNOSIS — Z993 Dependence on wheelchair: Secondary | ICD-10-CM | POA: Diagnosis not present

## 2021-09-14 DIAGNOSIS — M21371 Foot drop, right foot: Secondary | ICD-10-CM | POA: Diagnosis not present

## 2021-10-05 DIAGNOSIS — G609 Hereditary and idiopathic neuropathy, unspecified: Secondary | ICD-10-CM | POA: Diagnosis not present

## 2021-10-05 DIAGNOSIS — G6181 Chronic inflammatory demyelinating polyneuritis: Secondary | ICD-10-CM | POA: Diagnosis not present

## 2021-10-05 DIAGNOSIS — D892 Hypergammaglobulinemia, unspecified: Secondary | ICD-10-CM | POA: Diagnosis not present

## 2021-10-09 DIAGNOSIS — G6181 Chronic inflammatory demyelinating polyneuritis: Secondary | ICD-10-CM | POA: Diagnosis not present

## 2021-10-09 DIAGNOSIS — G894 Chronic pain syndrome: Secondary | ICD-10-CM | POA: Diagnosis not present

## 2021-10-09 DIAGNOSIS — M21372 Foot drop, left foot: Secondary | ICD-10-CM | POA: Diagnosis not present

## 2021-10-09 DIAGNOSIS — M961 Postlaminectomy syndrome, not elsewhere classified: Secondary | ICD-10-CM | POA: Diagnosis not present

## 2021-10-09 DIAGNOSIS — M542 Cervicalgia: Secondary | ICD-10-CM | POA: Diagnosis not present

## 2021-10-09 DIAGNOSIS — Z993 Dependence on wheelchair: Secondary | ICD-10-CM | POA: Diagnosis not present

## 2021-10-09 DIAGNOSIS — E1165 Type 2 diabetes mellitus with hyperglycemia: Secondary | ICD-10-CM | POA: Diagnosis not present

## 2021-10-09 DIAGNOSIS — M21371 Foot drop, right foot: Secondary | ICD-10-CM | POA: Diagnosis not present

## 2021-10-09 DIAGNOSIS — Z79891 Long term (current) use of opiate analgesic: Secondary | ICD-10-CM | POA: Diagnosis not present

## 2021-10-26 DIAGNOSIS — Z8249 Family history of ischemic heart disease and other diseases of the circulatory system: Secondary | ICD-10-CM | POA: Diagnosis not present

## 2021-10-26 DIAGNOSIS — Z7901 Long term (current) use of anticoagulants: Secondary | ICD-10-CM | POA: Diagnosis not present

## 2021-10-26 DIAGNOSIS — Z794 Long term (current) use of insulin: Secondary | ICD-10-CM | POA: Diagnosis not present

## 2021-10-26 DIAGNOSIS — Z7984 Long term (current) use of oral hypoglycemic drugs: Secondary | ICD-10-CM | POA: Diagnosis not present

## 2021-10-26 DIAGNOSIS — Z7722 Contact with and (suspected) exposure to environmental tobacco smoke (acute) (chronic): Secondary | ICD-10-CM | POA: Diagnosis not present

## 2021-10-26 DIAGNOSIS — I82491 Acute embolism and thrombosis of other specified deep vein of right lower extremity: Secondary | ICD-10-CM | POA: Diagnosis not present

## 2021-11-09 DIAGNOSIS — M21372 Foot drop, left foot: Secondary | ICD-10-CM | POA: Diagnosis not present

## 2021-11-09 DIAGNOSIS — Z79891 Long term (current) use of opiate analgesic: Secondary | ICD-10-CM | POA: Diagnosis not present

## 2021-11-09 DIAGNOSIS — Z993 Dependence on wheelchair: Secondary | ICD-10-CM | POA: Diagnosis not present

## 2021-11-09 DIAGNOSIS — G6181 Chronic inflammatory demyelinating polyneuritis: Secondary | ICD-10-CM | POA: Diagnosis not present

## 2021-11-09 DIAGNOSIS — M961 Postlaminectomy syndrome, not elsewhere classified: Secondary | ICD-10-CM | POA: Diagnosis not present

## 2021-11-09 DIAGNOSIS — M542 Cervicalgia: Secondary | ICD-10-CM | POA: Diagnosis not present

## 2021-11-09 DIAGNOSIS — G894 Chronic pain syndrome: Secondary | ICD-10-CM | POA: Diagnosis not present

## 2021-11-09 DIAGNOSIS — M21371 Foot drop, right foot: Secondary | ICD-10-CM | POA: Diagnosis not present

## 2021-11-09 DIAGNOSIS — E1165 Type 2 diabetes mellitus with hyperglycemia: Secondary | ICD-10-CM | POA: Diagnosis not present

## 2021-11-22 NOTE — Progress Notes (Signed)
Encounter opened in error

## 2021-11-29 DIAGNOSIS — E1165 Type 2 diabetes mellitus with hyperglycemia: Secondary | ICD-10-CM | POA: Diagnosis not present

## 2021-11-29 DIAGNOSIS — F320 Major depressive disorder, single episode, mild: Secondary | ICD-10-CM | POA: Diagnosis not present

## 2021-11-29 DIAGNOSIS — G894 Chronic pain syndrome: Secondary | ICD-10-CM | POA: Diagnosis not present

## 2021-11-29 DIAGNOSIS — Z780 Asymptomatic menopausal state: Secondary | ICD-10-CM | POA: Diagnosis not present

## 2021-11-29 DIAGNOSIS — E663 Overweight: Secondary | ICD-10-CM | POA: Diagnosis not present

## 2021-11-29 DIAGNOSIS — Z1231 Encounter for screening mammogram for malignant neoplasm of breast: Secondary | ICD-10-CM | POA: Diagnosis not present

## 2021-11-29 DIAGNOSIS — E559 Vitamin D deficiency, unspecified: Secondary | ICD-10-CM | POA: Diagnosis not present

## 2021-11-29 DIAGNOSIS — Z7901 Long term (current) use of anticoagulants: Secondary | ICD-10-CM | POA: Diagnosis not present

## 2021-11-29 DIAGNOSIS — D6869 Other thrombophilia: Secondary | ICD-10-CM | POA: Diagnosis not present

## 2021-11-29 DIAGNOSIS — R140 Abdominal distension (gaseous): Secondary | ICD-10-CM | POA: Diagnosis not present

## 2021-12-07 DIAGNOSIS — M542 Cervicalgia: Secondary | ICD-10-CM | POA: Diagnosis not present

## 2021-12-07 DIAGNOSIS — M21372 Foot drop, left foot: Secondary | ICD-10-CM | POA: Diagnosis not present

## 2021-12-07 DIAGNOSIS — M21371 Foot drop, right foot: Secondary | ICD-10-CM | POA: Diagnosis not present

## 2021-12-07 DIAGNOSIS — G894 Chronic pain syndrome: Secondary | ICD-10-CM | POA: Diagnosis not present

## 2021-12-07 DIAGNOSIS — Z993 Dependence on wheelchair: Secondary | ICD-10-CM | POA: Diagnosis not present

## 2021-12-07 DIAGNOSIS — Z79891 Long term (current) use of opiate analgesic: Secondary | ICD-10-CM | POA: Diagnosis not present

## 2021-12-07 DIAGNOSIS — G6181 Chronic inflammatory demyelinating polyneuritis: Secondary | ICD-10-CM | POA: Diagnosis not present

## 2021-12-07 DIAGNOSIS — M961 Postlaminectomy syndrome, not elsewhere classified: Secondary | ICD-10-CM | POA: Diagnosis not present

## 2021-12-09 DIAGNOSIS — E1165 Type 2 diabetes mellitus with hyperglycemia: Secondary | ICD-10-CM | POA: Diagnosis not present

## 2021-12-10 DIAGNOSIS — E1165 Type 2 diabetes mellitus with hyperglycemia: Secondary | ICD-10-CM | POA: Diagnosis not present

## 2021-12-10 DIAGNOSIS — F320 Major depressive disorder, single episode, mild: Secondary | ICD-10-CM | POA: Diagnosis not present

## 2021-12-10 DIAGNOSIS — E559 Vitamin D deficiency, unspecified: Secondary | ICD-10-CM | POA: Diagnosis not present

## 2021-12-10 DIAGNOSIS — G609 Hereditary and idiopathic neuropathy, unspecified: Secondary | ICD-10-CM | POA: Diagnosis not present

## 2021-12-17 DIAGNOSIS — E1165 Type 2 diabetes mellitus with hyperglycemia: Secondary | ICD-10-CM | POA: Diagnosis not present

## 2021-12-17 DIAGNOSIS — E785 Hyperlipidemia, unspecified: Secondary | ICD-10-CM | POA: Diagnosis not present

## 2021-12-24 DIAGNOSIS — F321 Major depressive disorder, single episode, moderate: Secondary | ICD-10-CM | POA: Diagnosis not present

## 2021-12-24 DIAGNOSIS — Z9149 Other personal history of psychological trauma, not elsewhere classified: Secondary | ICD-10-CM | POA: Diagnosis not present

## 2022-01-04 ENCOUNTER — Ambulatory Visit: Payer: Medicare Other

## 2022-02-12 ENCOUNTER — Other Ambulatory Visit: Payer: Self-pay | Admitting: Family

## 2022-02-22 ENCOUNTER — Other Ambulatory Visit: Payer: Self-pay | Admitting: Family

## 2023-03-18 ENCOUNTER — Other Ambulatory Visit: Payer: Self-pay | Admitting: Family
